# Patient Record
Sex: Female | Born: 1965
Health system: Southern US, Community
[De-identification: ages and names within clinical notes are randomized; demographics above are authoritative.]

## PROBLEM LIST (undated history)

## (undated) DIAGNOSIS — F32A Depression, unspecified: Secondary | ICD-10-CM

## (undated) DIAGNOSIS — F909 Attention-deficit hyperactivity disorder, unspecified type: Secondary | ICD-10-CM

## (undated) DIAGNOSIS — F329 Major depressive disorder, single episode, unspecified: Secondary | ICD-10-CM

## (undated) DIAGNOSIS — M5430 Sciatica, unspecified side: Secondary | ICD-10-CM

## (undated) DIAGNOSIS — G061 Intraspinal abscess and granuloma: Secondary | ICD-10-CM

## (undated) DIAGNOSIS — N730 Acute parametritis and pelvic cellulitis: Secondary | ICD-10-CM

## (undated) DIAGNOSIS — N83202 Unspecified ovarian cyst, left side: Secondary | ICD-10-CM

## (undated) DIAGNOSIS — F4310 Post-traumatic stress disorder, unspecified: Secondary | ICD-10-CM

## (undated) DIAGNOSIS — N83201 Unspecified ovarian cyst, right side: Secondary | ICD-10-CM

## (undated) DIAGNOSIS — A749 Chlamydial infection, unspecified: Secondary | ICD-10-CM

## (undated) HISTORY — PX: OTHER SURGICAL HISTORY: SHX169

## (undated) HISTORY — PX: ABDOMINAL HYSTERECTOMY: SHX81

## (undated) HISTORY — DX: Intraspinal abscess and granuloma: G06.1

## (undated) HISTORY — PX: BLADDER SURGERY: SHX569

## (undated) HISTORY — PX: BACK SURGERY: SHX140

## (undated) HISTORY — PX: LAPAROSCOPY: SHX197

---

## 2003-10-22 ENCOUNTER — Emergency Department (HOSPITAL_COMMUNITY): Admission: EM | Admit: 2003-10-22 | Discharge: 2003-10-22 | Payer: Self-pay | Admitting: Emergency Medicine

## 2003-10-25 ENCOUNTER — Emergency Department (HOSPITAL_COMMUNITY): Admission: EM | Admit: 2003-10-25 | Discharge: 2003-10-25 | Payer: Self-pay | Admitting: Emergency Medicine

## 2004-01-25 ENCOUNTER — Ambulatory Visit (HOSPITAL_COMMUNITY): Admission: RE | Admit: 2004-01-25 | Discharge: 2004-01-25 | Payer: Self-pay | Admitting: Family Medicine

## 2004-02-11 ENCOUNTER — Emergency Department (HOSPITAL_COMMUNITY): Admission: EM | Admit: 2004-02-11 | Discharge: 2004-02-11 | Payer: Self-pay | Admitting: Emergency Medicine

## 2004-02-13 ENCOUNTER — Ambulatory Visit (HOSPITAL_COMMUNITY): Admission: RE | Admit: 2004-02-13 | Discharge: 2004-02-13 | Payer: Self-pay | Admitting: Family Medicine

## 2004-03-31 ENCOUNTER — Emergency Department (HOSPITAL_COMMUNITY): Admission: EM | Admit: 2004-03-31 | Discharge: 2004-03-31 | Payer: Self-pay | Admitting: Emergency Medicine

## 2004-05-16 ENCOUNTER — Emergency Department (HOSPITAL_COMMUNITY): Admission: EM | Admit: 2004-05-16 | Discharge: 2004-05-17 | Payer: Self-pay | Admitting: Emergency Medicine

## 2004-07-21 ENCOUNTER — Emergency Department (HOSPITAL_COMMUNITY): Admission: EM | Admit: 2004-07-21 | Discharge: 2004-07-21 | Payer: Self-pay | Admitting: Emergency Medicine

## 2004-07-24 ENCOUNTER — Ambulatory Visit: Payer: Self-pay | Admitting: Family Medicine

## 2004-08-02 ENCOUNTER — Ambulatory Visit (HOSPITAL_COMMUNITY): Admission: RE | Admit: 2004-08-02 | Discharge: 2004-08-02 | Payer: Self-pay | Admitting: Family Medicine

## 2004-08-22 ENCOUNTER — Encounter: Admission: RE | Admit: 2004-08-22 | Discharge: 2004-09-25 | Payer: Self-pay | Admitting: Family Medicine

## 2004-09-01 ENCOUNTER — Emergency Department (HOSPITAL_COMMUNITY): Admission: EM | Admit: 2004-09-01 | Discharge: 2004-09-01 | Payer: Self-pay | Admitting: Emergency Medicine

## 2004-10-05 ENCOUNTER — Emergency Department (HOSPITAL_COMMUNITY): Admission: EM | Admit: 2004-10-05 | Discharge: 2004-10-06 | Payer: Self-pay | Admitting: Emergency Medicine

## 2005-03-05 ENCOUNTER — Emergency Department (HOSPITAL_COMMUNITY): Admission: EM | Admit: 2005-03-05 | Discharge: 2005-03-05 | Payer: Self-pay | Admitting: Emergency Medicine

## 2005-04-19 ENCOUNTER — Emergency Department (HOSPITAL_COMMUNITY): Admission: EM | Admit: 2005-04-19 | Discharge: 2005-04-19 | Payer: Self-pay | Admitting: Emergency Medicine

## 2005-05-18 ENCOUNTER — Ambulatory Visit: Payer: Self-pay | Admitting: Family Medicine

## 2005-06-27 ENCOUNTER — Emergency Department (HOSPITAL_COMMUNITY): Admission: EM | Admit: 2005-06-27 | Discharge: 2005-06-28 | Payer: Self-pay | Admitting: Emergency Medicine

## 2005-07-24 ENCOUNTER — Ambulatory Visit: Payer: Self-pay | Admitting: Family Medicine

## 2006-02-12 ENCOUNTER — Emergency Department (HOSPITAL_COMMUNITY): Admission: EM | Admit: 2006-02-12 | Discharge: 2006-02-12 | Payer: Self-pay | Admitting: Emergency Medicine

## 2006-03-11 ENCOUNTER — Encounter: Admission: RE | Admit: 2006-03-11 | Discharge: 2006-03-11 | Payer: Self-pay | Admitting: Orthopedic Surgery

## 2006-04-28 ENCOUNTER — Emergency Department (HOSPITAL_COMMUNITY): Admission: EM | Admit: 2006-04-28 | Discharge: 2006-04-28 | Payer: Self-pay | Admitting: Emergency Medicine

## 2006-05-06 ENCOUNTER — Emergency Department (HOSPITAL_COMMUNITY): Admission: EM | Admit: 2006-05-06 | Discharge: 2006-05-06 | Payer: Self-pay | Admitting: *Deleted

## 2006-05-24 ENCOUNTER — Emergency Department (HOSPITAL_COMMUNITY): Admission: EM | Admit: 2006-05-24 | Discharge: 2006-05-24 | Payer: Self-pay | Admitting: *Deleted

## 2006-06-19 ENCOUNTER — Encounter: Admission: RE | Admit: 2006-06-19 | Discharge: 2006-06-19 | Payer: Self-pay | Admitting: Gastroenterology

## 2006-09-30 ENCOUNTER — Emergency Department (HOSPITAL_COMMUNITY): Admission: EM | Admit: 2006-09-30 | Discharge: 2006-09-30 | Payer: Self-pay | Admitting: Emergency Medicine

## 2006-11-24 ENCOUNTER — Emergency Department (HOSPITAL_COMMUNITY): Admission: EM | Admit: 2006-11-24 | Discharge: 2006-11-24 | Payer: Self-pay | Admitting: Emergency Medicine

## 2006-12-16 ENCOUNTER — Emergency Department (HOSPITAL_COMMUNITY): Admission: EM | Admit: 2006-12-16 | Discharge: 2006-12-16 | Payer: Self-pay | Admitting: Emergency Medicine

## 2006-12-24 ENCOUNTER — Ambulatory Visit: Admission: RE | Admit: 2006-12-24 | Discharge: 2006-12-24 | Payer: Self-pay | Admitting: Orthopedic Surgery

## 2007-01-19 ENCOUNTER — Encounter (INDEPENDENT_AMBULATORY_CARE_PROVIDER_SITE_OTHER): Payer: Self-pay | Admitting: Specialist

## 2007-01-19 ENCOUNTER — Inpatient Hospital Stay (HOSPITAL_COMMUNITY): Admission: RE | Admit: 2007-01-19 | Discharge: 2007-01-25 | Payer: Self-pay | Admitting: Orthopedic Surgery

## 2007-02-22 ENCOUNTER — Emergency Department (HOSPITAL_COMMUNITY): Admission: EM | Admit: 2007-02-22 | Discharge: 2007-02-22 | Payer: Self-pay | Admitting: Emergency Medicine

## 2007-04-27 ENCOUNTER — Emergency Department (HOSPITAL_COMMUNITY): Admission: EM | Admit: 2007-04-27 | Discharge: 2007-04-28 | Payer: Self-pay | Admitting: Emergency Medicine

## 2007-05-02 ENCOUNTER — Emergency Department (HOSPITAL_COMMUNITY): Admission: EM | Admit: 2007-05-02 | Discharge: 2007-05-02 | Payer: Self-pay | Admitting: Emergency Medicine

## 2007-05-27 ENCOUNTER — Emergency Department (HOSPITAL_COMMUNITY): Admission: EM | Admit: 2007-05-27 | Discharge: 2007-05-27 | Payer: Self-pay | Admitting: Emergency Medicine

## 2007-06-17 ENCOUNTER — Emergency Department (HOSPITAL_COMMUNITY): Admission: EM | Admit: 2007-06-17 | Discharge: 2007-06-17 | Payer: Self-pay | Admitting: Emergency Medicine

## 2007-06-29 ENCOUNTER — Other Ambulatory Visit: Payer: Self-pay

## 2007-06-29 ENCOUNTER — Inpatient Hospital Stay (HOSPITAL_COMMUNITY): Admission: RE | Admit: 2007-06-29 | Discharge: 2007-07-01 | Payer: Self-pay | Admitting: *Deleted

## 2007-06-29 ENCOUNTER — Other Ambulatory Visit: Payer: Self-pay | Admitting: Emergency Medicine

## 2007-06-29 ENCOUNTER — Ambulatory Visit: Payer: Self-pay | Admitting: *Deleted

## 2007-08-08 ENCOUNTER — Emergency Department (HOSPITAL_COMMUNITY): Admission: EM | Admit: 2007-08-08 | Discharge: 2007-08-08 | Payer: Self-pay | Admitting: Emergency Medicine

## 2007-09-23 ENCOUNTER — Emergency Department (HOSPITAL_COMMUNITY): Admission: EM | Admit: 2007-09-23 | Discharge: 2007-09-23 | Payer: Self-pay | Admitting: Emergency Medicine

## 2007-10-03 ENCOUNTER — Ambulatory Visit: Payer: Self-pay | Admitting: Physical Medicine & Rehabilitation

## 2007-10-03 ENCOUNTER — Encounter
Admission: RE | Admit: 2007-10-03 | Discharge: 2008-01-01 | Payer: Self-pay | Admitting: Physical Medicine & Rehabilitation

## 2007-11-05 ENCOUNTER — Emergency Department (HOSPITAL_COMMUNITY): Admission: EM | Admit: 2007-11-05 | Discharge: 2007-11-05 | Payer: Self-pay | Admitting: Emergency Medicine

## 2007-11-20 ENCOUNTER — Emergency Department (HOSPITAL_COMMUNITY): Admission: EM | Admit: 2007-11-20 | Discharge: 2007-11-20 | Payer: Self-pay | Admitting: Emergency Medicine

## 2007-11-24 ENCOUNTER — Ambulatory Visit: Payer: Self-pay | Admitting: Physical Medicine & Rehabilitation

## 2007-11-24 ENCOUNTER — Encounter
Admission: RE | Admit: 2007-11-24 | Discharge: 2007-12-05 | Payer: Self-pay | Admitting: Physical Medicine & Rehabilitation

## 2007-12-08 ENCOUNTER — Emergency Department (HOSPITAL_COMMUNITY): Admission: EM | Admit: 2007-12-08 | Discharge: 2007-12-08 | Payer: Self-pay | Admitting: Emergency Medicine

## 2007-12-14 ENCOUNTER — Ambulatory Visit (HOSPITAL_COMMUNITY)
Admission: RE | Admit: 2007-12-14 | Discharge: 2007-12-14 | Payer: Self-pay | Admitting: Physical Medicine & Rehabilitation

## 2007-12-26 ENCOUNTER — Emergency Department (HOSPITAL_COMMUNITY): Admission: EM | Admit: 2007-12-26 | Discharge: 2007-12-26 | Payer: Self-pay | Admitting: Emergency Medicine

## 2008-01-02 ENCOUNTER — Encounter
Admission: RE | Admit: 2008-01-02 | Discharge: 2008-02-20 | Payer: Self-pay | Admitting: Physical Medicine & Rehabilitation

## 2008-01-02 ENCOUNTER — Ambulatory Visit: Payer: Self-pay | Admitting: Physical Medicine & Rehabilitation

## 2008-01-22 ENCOUNTER — Encounter: Admission: RE | Admit: 2008-01-22 | Discharge: 2008-01-22 | Payer: Self-pay | Admitting: Specialist

## 2008-02-09 ENCOUNTER — Ambulatory Visit: Payer: Self-pay | Admitting: Physical Medicine & Rehabilitation

## 2008-02-15 ENCOUNTER — Emergency Department (HOSPITAL_COMMUNITY): Admission: EM | Admit: 2008-02-15 | Discharge: 2008-02-15 | Payer: Self-pay | Admitting: Emergency Medicine

## 2008-02-20 ENCOUNTER — Ambulatory Visit: Payer: Self-pay | Admitting: Physical Medicine & Rehabilitation

## 2008-03-17 ENCOUNTER — Inpatient Hospital Stay (HOSPITAL_COMMUNITY): Admission: EM | Admit: 2008-03-17 | Discharge: 2008-03-22 | Payer: Self-pay | Admitting: *Deleted

## 2008-03-19 ENCOUNTER — Ambulatory Visit: Payer: Self-pay | Admitting: *Deleted

## 2008-03-27 ENCOUNTER — Emergency Department (HOSPITAL_COMMUNITY): Admission: EM | Admit: 2008-03-27 | Discharge: 2008-03-27 | Payer: Self-pay | Admitting: Emergency Medicine

## 2008-04-23 ENCOUNTER — Emergency Department (HOSPITAL_COMMUNITY): Admission: EM | Admit: 2008-04-23 | Discharge: 2008-04-23 | Payer: Self-pay | Admitting: Emergency Medicine

## 2008-04-26 ENCOUNTER — Emergency Department (HOSPITAL_COMMUNITY): Admission: EM | Admit: 2008-04-26 | Discharge: 2008-04-27 | Payer: Self-pay | Admitting: Emergency Medicine

## 2008-05-16 ENCOUNTER — Encounter: Admission: RE | Admit: 2008-05-16 | Discharge: 2008-05-16 | Payer: Self-pay | Admitting: Orthopedic Surgery

## 2008-05-23 ENCOUNTER — Emergency Department (HOSPITAL_COMMUNITY): Admission: EM | Admit: 2008-05-23 | Discharge: 2008-05-23 | Payer: Self-pay | Admitting: Emergency Medicine

## 2008-06-14 ENCOUNTER — Emergency Department (HOSPITAL_COMMUNITY): Admission: EM | Admit: 2008-06-14 | Discharge: 2008-06-14 | Payer: Self-pay | Admitting: Emergency Medicine

## 2008-06-28 ENCOUNTER — Ambulatory Visit (HOSPITAL_COMMUNITY): Admission: RE | Admit: 2008-06-28 | Discharge: 2008-06-28 | Payer: Self-pay | Admitting: Psychiatry

## 2008-08-08 ENCOUNTER — Emergency Department (HOSPITAL_COMMUNITY): Admission: EM | Admit: 2008-08-08 | Discharge: 2008-08-08 | Payer: Self-pay | Admitting: Emergency Medicine

## 2008-08-25 ENCOUNTER — Emergency Department (HOSPITAL_COMMUNITY): Admission: EM | Admit: 2008-08-25 | Discharge: 2008-08-25 | Payer: Self-pay | Admitting: Emergency Medicine

## 2008-09-18 ENCOUNTER — Emergency Department (HOSPITAL_BASED_OUTPATIENT_CLINIC_OR_DEPARTMENT_OTHER): Admission: EM | Admit: 2008-09-18 | Discharge: 2008-09-18 | Payer: Self-pay | Admitting: Emergency Medicine

## 2008-10-22 ENCOUNTER — Emergency Department (HOSPITAL_BASED_OUTPATIENT_CLINIC_OR_DEPARTMENT_OTHER): Admission: EM | Admit: 2008-10-22 | Discharge: 2008-10-22 | Payer: Self-pay | Admitting: Emergency Medicine

## 2008-11-07 ENCOUNTER — Observation Stay (HOSPITAL_COMMUNITY): Admission: RE | Admit: 2008-11-07 | Discharge: 2008-11-10 | Payer: Self-pay | Admitting: Urology

## 2008-11-18 ENCOUNTER — Emergency Department (HOSPITAL_COMMUNITY): Admission: EM | Admit: 2008-11-18 | Discharge: 2008-11-18 | Payer: Self-pay | Admitting: Emergency Medicine

## 2009-02-03 ENCOUNTER — Emergency Department (HOSPITAL_BASED_OUTPATIENT_CLINIC_OR_DEPARTMENT_OTHER): Admission: EM | Admit: 2009-02-03 | Discharge: 2009-02-03 | Payer: Self-pay | Admitting: Emergency Medicine

## 2009-02-07 ENCOUNTER — Ambulatory Visit (HOSPITAL_COMMUNITY): Admission: RE | Admit: 2009-02-07 | Discharge: 2009-02-07 | Payer: Self-pay | Admitting: Unknown Physician Specialty

## 2009-02-15 ENCOUNTER — Emergency Department (HOSPITAL_COMMUNITY): Admission: EM | Admit: 2009-02-15 | Discharge: 2009-02-15 | Payer: Self-pay | Admitting: Emergency Medicine

## 2009-04-30 ENCOUNTER — Emergency Department (HOSPITAL_BASED_OUTPATIENT_CLINIC_OR_DEPARTMENT_OTHER): Admission: EM | Admit: 2009-04-30 | Discharge: 2009-04-30 | Payer: Self-pay | Admitting: Emergency Medicine

## 2009-05-12 ENCOUNTER — Emergency Department (HOSPITAL_COMMUNITY): Admission: EM | Admit: 2009-05-12 | Discharge: 2009-05-13 | Payer: Self-pay | Admitting: Emergency Medicine

## 2009-06-07 ENCOUNTER — Emergency Department (HOSPITAL_BASED_OUTPATIENT_CLINIC_OR_DEPARTMENT_OTHER): Admission: EM | Admit: 2009-06-07 | Discharge: 2009-06-07 | Payer: Self-pay | Admitting: Emergency Medicine

## 2009-06-30 ENCOUNTER — Emergency Department (HOSPITAL_COMMUNITY): Admission: EM | Admit: 2009-06-30 | Discharge: 2009-07-01 | Payer: Self-pay | Admitting: Emergency Medicine

## 2009-07-16 ENCOUNTER — Emergency Department (HOSPITAL_COMMUNITY): Admission: EM | Admit: 2009-07-16 | Discharge: 2009-07-16 | Payer: Self-pay | Admitting: Emergency Medicine

## 2009-08-02 ENCOUNTER — Emergency Department (HOSPITAL_COMMUNITY): Admission: EM | Admit: 2009-08-02 | Discharge: 2009-08-02 | Payer: Self-pay | Admitting: Emergency Medicine

## 2009-08-28 ENCOUNTER — Emergency Department (HOSPITAL_COMMUNITY): Admission: EM | Admit: 2009-08-28 | Discharge: 2009-08-28 | Payer: Self-pay | Admitting: Emergency Medicine

## 2009-09-20 ENCOUNTER — Emergency Department (HOSPITAL_COMMUNITY): Admission: EM | Admit: 2009-09-20 | Discharge: 2009-09-20 | Payer: Self-pay | Admitting: Emergency Medicine

## 2009-09-24 ENCOUNTER — Encounter: Admission: RE | Admit: 2009-09-24 | Discharge: 2009-10-24 | Payer: Self-pay | Admitting: Sports Medicine

## 2009-10-09 ENCOUNTER — Emergency Department (HOSPITAL_COMMUNITY): Admission: EM | Admit: 2009-10-09 | Discharge: 2009-10-09 | Payer: Self-pay | Admitting: Emergency Medicine

## 2009-10-16 ENCOUNTER — Emergency Department (HOSPITAL_COMMUNITY): Admission: EM | Admit: 2009-10-16 | Discharge: 2009-10-16 | Payer: Self-pay | Admitting: Emergency Medicine

## 2009-11-22 ENCOUNTER — Emergency Department (HOSPITAL_COMMUNITY): Admission: EM | Admit: 2009-11-22 | Discharge: 2009-11-22 | Payer: Self-pay | Admitting: Emergency Medicine

## 2009-12-04 ENCOUNTER — Emergency Department (HOSPITAL_COMMUNITY): Admission: EM | Admit: 2009-12-04 | Discharge: 2009-12-04 | Payer: Self-pay | Admitting: Emergency Medicine

## 2009-12-17 ENCOUNTER — Emergency Department (HOSPITAL_COMMUNITY): Admission: EM | Admit: 2009-12-17 | Discharge: 2009-12-17 | Payer: Self-pay | Admitting: Emergency Medicine

## 2010-01-20 ENCOUNTER — Emergency Department (HOSPITAL_COMMUNITY): Admission: EM | Admit: 2010-01-20 | Discharge: 2010-01-20 | Payer: Self-pay | Admitting: Emergency Medicine

## 2010-02-05 ENCOUNTER — Emergency Department (HOSPITAL_COMMUNITY): Admission: EM | Admit: 2010-02-05 | Discharge: 2010-02-06 | Payer: Self-pay | Admitting: Emergency Medicine

## 2010-02-06 ENCOUNTER — Inpatient Hospital Stay (HOSPITAL_COMMUNITY): Admission: AD | Admit: 2010-02-06 | Discharge: 2010-02-12 | Payer: Self-pay | Admitting: Psychiatry

## 2010-02-06 ENCOUNTER — Ambulatory Visit: Payer: Self-pay | Admitting: Psychiatry

## 2010-02-08 ENCOUNTER — Emergency Department (HOSPITAL_COMMUNITY): Admission: EM | Admit: 2010-02-08 | Discharge: 2010-02-09 | Payer: Self-pay | Admitting: Emergency Medicine

## 2010-02-18 ENCOUNTER — Emergency Department (HOSPITAL_COMMUNITY): Admission: EM | Admit: 2010-02-18 | Discharge: 2010-02-19 | Payer: Self-pay | Admitting: Emergency Medicine

## 2010-02-28 ENCOUNTER — Ambulatory Visit: Payer: Self-pay | Admitting: Family Medicine

## 2010-03-05 ENCOUNTER — Emergency Department (HOSPITAL_COMMUNITY): Admission: EM | Admit: 2010-03-05 | Discharge: 2010-03-05 | Payer: Self-pay | Admitting: Emergency Medicine

## 2010-03-08 ENCOUNTER — Emergency Department (HOSPITAL_COMMUNITY): Admission: EM | Admit: 2010-03-08 | Discharge: 2010-03-09 | Payer: Self-pay | Admitting: Emergency Medicine

## 2010-03-16 ENCOUNTER — Emergency Department (HOSPITAL_COMMUNITY): Admission: EM | Admit: 2010-03-16 | Discharge: 2010-03-16 | Payer: Self-pay | Admitting: Emergency Medicine

## 2010-03-28 ENCOUNTER — Emergency Department (HOSPITAL_COMMUNITY): Admission: EM | Admit: 2010-03-28 | Discharge: 2010-03-28 | Payer: Self-pay | Admitting: Emergency Medicine

## 2010-03-28 ENCOUNTER — Ambulatory Visit: Payer: Self-pay | Admitting: Psychiatry

## 2010-03-28 ENCOUNTER — Inpatient Hospital Stay (HOSPITAL_COMMUNITY): Admission: AD | Admit: 2010-03-28 | Discharge: 2010-04-09 | Payer: Self-pay | Admitting: Psychiatry

## 2010-06-16 ENCOUNTER — Ambulatory Visit: Payer: Self-pay | Admitting: Psychiatry

## 2010-06-27 ENCOUNTER — Emergency Department (HOSPITAL_COMMUNITY)
Admission: EM | Admit: 2010-06-27 | Discharge: 2010-06-27 | Payer: Self-pay | Source: Home / Self Care | Admitting: Emergency Medicine

## 2010-08-29 ENCOUNTER — Emergency Department (HOSPITAL_COMMUNITY): Admission: EM | Admit: 2010-08-29 | Discharge: 2010-08-29 | Payer: Self-pay | Admitting: Emergency Medicine

## 2010-10-02 ENCOUNTER — Emergency Department (HOSPITAL_COMMUNITY): Admission: EM | Admit: 2010-10-02 | Discharge: 2010-06-16 | Payer: Self-pay | Admitting: Emergency Medicine

## 2010-10-04 ENCOUNTER — Emergency Department (HOSPITAL_COMMUNITY)
Admission: EM | Admit: 2010-10-04 | Discharge: 2010-10-04 | Payer: Self-pay | Source: Home / Self Care | Admitting: Emergency Medicine

## 2010-10-06 ENCOUNTER — Emergency Department (HOSPITAL_COMMUNITY)
Admission: EM | Admit: 2010-10-06 | Discharge: 2010-10-06 | Payer: Self-pay | Source: Home / Self Care | Admitting: Emergency Medicine

## 2010-10-13 ENCOUNTER — Emergency Department (HOSPITAL_COMMUNITY)
Admission: EM | Admit: 2010-10-13 | Discharge: 2010-10-13 | Payer: Self-pay | Source: Home / Self Care | Admitting: Emergency Medicine

## 2010-11-15 ENCOUNTER — Encounter: Payer: Self-pay | Admitting: Internal Medicine

## 2011-01-06 LAB — URINALYSIS, ROUTINE W REFLEX MICROSCOPIC
Ketones, ur: NEGATIVE mg/dL
Nitrite: NEGATIVE
Urobilinogen, UA: 0.2 mg/dL (ref 0.0–1.0)
pH: 8 (ref 5.0–8.0)

## 2011-01-08 LAB — DIFFERENTIAL
Eosinophils Absolute: 0.2 10*3/uL (ref 0.0–0.7)
Eosinophils Relative: 2 % (ref 0–5)
Lymphocytes Relative: 18 % (ref 12–46)
Lymphs Abs: 2.2 10*3/uL (ref 0.7–4.0)
Monocytes Absolute: 0.9 10*3/uL (ref 0.1–1.0)
Monocytes Relative: 7 % (ref 3–12)

## 2011-01-08 LAB — URINALYSIS, ROUTINE W REFLEX MICROSCOPIC
Bilirubin Urine: NEGATIVE
Bilirubin Urine: NEGATIVE
Glucose, UA: NEGATIVE mg/dL
Hgb urine dipstick: NEGATIVE
Ketones, ur: NEGATIVE mg/dL
Ketones, ur: NEGATIVE mg/dL
Nitrite: NEGATIVE
Protein, ur: NEGATIVE mg/dL
Protein, ur: NEGATIVE mg/dL
Urobilinogen, UA: 0.2 mg/dL (ref 0.0–1.0)

## 2011-01-08 LAB — WET PREP, GENITAL
Clue Cells Wet Prep HPF POC: NONE SEEN
Trich, Wet Prep: NONE SEEN
Yeast Wet Prep HPF POC: NONE SEEN

## 2011-01-08 LAB — RAPID URINE DRUG SCREEN, HOSP PERFORMED
Amphetamines: POSITIVE — AB
Barbiturates: NOT DETECTED
Cocaine: POSITIVE — AB
Opiates: POSITIVE — AB
Tetrahydrocannabinol: NOT DETECTED

## 2011-01-08 LAB — BASIC METABOLIC PANEL
BUN: 6 mg/dL (ref 6–23)
CO2: 24 mEq/L (ref 19–32)
Calcium: 9 mg/dL (ref 8.4–10.5)
GFR calc non Af Amer: >60 mL/min (ref >60)
Glucose, Bld: 94 mg/dL (ref 70–99)
Potassium: 3.3 mEq/L — ABNORMAL LOW (ref 3.5–5.1)

## 2011-01-08 LAB — CBC
HCT: 37.7 % (ref 36.0–46.0)
MCH: 31.2 pg (ref 26.0–34.0)
MCV: 89.4 fL (ref 78.0–100.0)
RBC: 4.22 MIL/uL (ref 3.87–5.11)
WBC: 12.1 10*3/uL — ABNORMAL HIGH (ref 4.0–10.5)

## 2011-01-08 LAB — RPR: RPR Ser Ql: NONREACTIVE

## 2011-01-08 LAB — GC/CHLAMYDIA PROBE AMP, GENITAL
Chlamydia, DNA Probe: NEGATIVE
GC Probe Amp, Genital: NEGATIVE

## 2011-01-11 LAB — URINALYSIS, ROUTINE W REFLEX MICROSCOPIC
Bilirubin Urine: NEGATIVE
Glucose, UA: NEGATIVE mg/dL
Ketones, ur: NEGATIVE mg/dL
Leukocytes, UA: NEGATIVE
Protein, ur: NEGATIVE mg/dL
pH: 6 (ref 5.0–8.0)

## 2011-01-11 LAB — URINE MICROSCOPIC-ADD ON

## 2011-01-11 LAB — POCT PREGNANCY, URINE: Preg Test, Ur: NEGATIVE

## 2011-01-12 LAB — COMPREHENSIVE METABOLIC PANEL
ALT: 11 U/L (ref 0–35)
Alkaline Phosphatase: 58 U/L (ref 39–117)
CO2: 25 mEq/L (ref 19–32)
Calcium: 8.5 mg/dL (ref 8.4–10.5)
GFR calc non Af Amer: >60 mL/min (ref >60)
Glucose, Bld: 94 mg/dL (ref 70–99)
Potassium: 4.1 mEq/L (ref 3.5–5.1)
Sodium: 136 mEq/L (ref 135–145)

## 2011-01-12 LAB — URINALYSIS, ROUTINE W REFLEX MICROSCOPIC
Bilirubin Urine: NEGATIVE
Bilirubin Urine: NEGATIVE
Glucose, UA: NEGATIVE mg/dL
Hgb urine dipstick: NEGATIVE
Ketones, ur: NEGATIVE mg/dL
Nitrite: NEGATIVE
Protein, ur: NEGATIVE mg/dL
Protein, ur: NEGATIVE mg/dL
Urobilinogen, UA: 0.2 mg/dL (ref 0.0–1.0)

## 2011-01-12 LAB — DIFFERENTIAL
Basophils Absolute: 0 10*3/uL (ref 0.0–0.1)
Basophils Relative: 0 % (ref 0–1)
Eosinophils Absolute: 0.2 10*3/uL (ref 0.0–0.7)
Neutrophils Relative %: 66 % (ref 43–77)

## 2011-01-12 LAB — CBC
HCT: 40.1 % (ref 36.0–46.0)
Hemoglobin: 13.9 g/dL (ref 12.0–15.0)
MCHC: 34.8 g/dL (ref 30.0–36.0)
RBC: 4.34 MIL/uL (ref 3.87–5.11)

## 2011-01-12 LAB — URINE CULTURE
Colony Count: 2000
Special Requests: NEGATIVE

## 2011-01-12 LAB — POCT CARDIAC MARKERS
CKMB, poc: <1 ng/mL — ABNORMAL LOW (ref 1.0–8.0)
Troponin i, poc: <0.05 ng/mL (ref 0.00–0.09)

## 2011-01-12 LAB — SAMPLE TO BLOOD BANK

## 2011-01-12 LAB — TSH: TSH: 3.33 u[IU]/mL (ref 0.350–4.500)

## 2011-01-12 LAB — URINE MICROSCOPIC-ADD ON

## 2011-01-12 LAB — RAPID URINE DRUG SCREEN, HOSP PERFORMED
Benzodiazepines: POSITIVE — AB
Cocaine: POSITIVE — AB
Tetrahydrocannabinol: NOT DETECTED

## 2011-01-13 LAB — DIFFERENTIAL
Basophils Absolute: 0.1 10*3/uL (ref 0.0–0.1)
Basophils Relative: 1 % (ref 0–1)
Eosinophils Absolute: 0.3 10*3/uL (ref 0.0–0.7)
Eosinophils Relative: 3 % (ref 0–5)
Monocytes Absolute: 0.8 10*3/uL (ref 0.1–1.0)
Neutro Abs: 4.1 10*3/uL (ref 1.7–7.7)

## 2011-01-13 LAB — URINE MICROSCOPIC-ADD ON

## 2011-01-13 LAB — URINALYSIS, ROUTINE W REFLEX MICROSCOPIC
Bilirubin Urine: NEGATIVE
Bilirubin Urine: NEGATIVE
Glucose, UA: NEGATIVE mg/dL
Ketones, ur: NEGATIVE mg/dL
Nitrite: NEGATIVE
Nitrite: NEGATIVE
Specific Gravity, Urine: 1.012 (ref 1.005–1.030)
Specific Gravity, Urine: 1.016 (ref 1.005–1.030)
Urobilinogen, UA: 0.2 mg/dL (ref 0.0–1.0)
Urobilinogen, UA: 0.2 mg/dL (ref 0.0–1.0)
pH: 6 (ref 5.0–8.0)

## 2011-01-13 LAB — POCT I-STAT, CHEM 8
Calcium, Ion: 1.15 mmol/L (ref 1.12–1.32)
HCT: 39 % (ref 36.0–46.0)
Hemoglobin: 13.3 g/dL (ref 12.0–15.0)
TCO2: 26 mmol/L (ref 0–100)

## 2011-01-13 LAB — CBC
HCT: 37 % (ref 36.0–46.0)
Hemoglobin: 12.6 g/dL (ref 12.0–15.0)
MCHC: 34 g/dL (ref 30.0–36.0)
MCV: 92.2 fL (ref 78.0–100.0)
RDW: 12.8 % (ref 11.5–15.5)

## 2011-01-13 LAB — GC/CHLAMYDIA PROBE AMP, GENITAL: GC Probe Amp, Genital: NEGATIVE

## 2011-01-13 LAB — WET PREP, GENITAL: WBC, Wet Prep HPF POC: NONE SEEN

## 2011-01-14 LAB — RAPID URINE DRUG SCREEN, HOSP PERFORMED
Amphetamines: NOT DETECTED
Barbiturates: NOT DETECTED
Benzodiazepines: POSITIVE — AB
Opiates: NOT DETECTED

## 2011-01-14 LAB — CBC
HCT: 40.5 % (ref 36.0–46.0)
HCT: 31.9 % — ABNORMAL LOW (ref 36.0–46.0)
MCHC: 34.2 g/dL (ref 30.0–36.0)
MCHC: 34.6 g/dL (ref 30.0–36.0)
MCV: 91.4 fL (ref 78.0–100.0)
MCV: 92.8 fL (ref 78.0–100.0)
MCV: 93.2 fL (ref 78.0–100.0)
Platelets: 472 10*3/uL — ABNORMAL HIGH (ref 150–400)
Platelets: 732 10*3/uL — ABNORMAL HIGH (ref 150–400)
RBC: 3.43 MIL/uL — ABNORMAL LOW (ref 3.87–5.11)
RDW: 13 % (ref 11.5–15.5)
RDW: 13.1 % (ref 11.5–15.5)
WBC: 6.3 10*3/uL (ref 4.0–10.5)
WBC: 9.9 10*3/uL (ref 4.0–10.5)

## 2011-01-14 LAB — POCT I-STAT, CHEM 8
HCT: 41 % (ref 36.0–46.0)
Hemoglobin: 13.9 g/dL (ref 12.0–15.0)
Potassium: 3.5 mEq/L (ref 3.5–5.1)
Sodium: 139 mEq/L (ref 135–145)
TCO2: 30 mmol/L (ref 0–100)

## 2011-01-14 LAB — URINALYSIS, ROUTINE W REFLEX MICROSCOPIC
Bilirubin Urine: NEGATIVE
Bilirubin Urine: NEGATIVE
Bilirubin Urine: NEGATIVE
Glucose, UA: NEGATIVE mg/dL
Glucose, UA: NEGATIVE mg/dL
Glucose, UA: NEGATIVE mg/dL
Hgb urine dipstick: NEGATIVE
Ketones, ur: NEGATIVE mg/dL
Ketones, ur: NEGATIVE mg/dL
Ketones, ur: >80 mg/dL — AB
Nitrite: NEGATIVE
Protein, ur: NEGATIVE mg/dL
Protein, ur: NEGATIVE mg/dL
Specific Gravity, Urine: 1.02 (ref 1.005–1.030)
pH: 6.5 (ref 5.0–8.0)

## 2011-01-14 LAB — DIFFERENTIAL
Basophils Relative: 1 % (ref 0–1)
Basophils Relative: 1 % (ref 0–1)
Eosinophils Absolute: 0.2 10*3/uL (ref 0.0–0.7)
Eosinophils Absolute: 0.3 10*3/uL (ref 0.0–0.7)
Eosinophils Relative: 1 % (ref 0–5)
Eosinophils Relative: 4 % (ref 0–5)
Lymphs Abs: 2.1 10*3/uL (ref 0.7–4.0)
Lymphs Abs: 1.4 10*3/uL (ref 0.7–4.0)
Monocytes Absolute: 0.6 10*3/uL (ref 0.1–1.0)
Monocytes Relative: 6 % (ref 3–12)
Neutro Abs: 7.2 10*3/uL (ref 1.7–7.7)
Neutrophils Relative %: 70 % (ref 43–77)

## 2011-01-14 LAB — URINE MICROSCOPIC-ADD ON

## 2011-01-14 LAB — URINE CULTURE
Colony Count: 8000
Colony Count: NO GROWTH

## 2011-01-14 LAB — GC/CHLAMYDIA PROBE AMP, GENITAL: Chlamydia, DNA Probe: NEGATIVE

## 2011-01-14 LAB — BASIC METABOLIC PANEL
BUN: 4 mg/dL — ABNORMAL LOW (ref 6–23)
Chloride: 105 mEq/L (ref 96–112)
Glucose, Bld: 94 mg/dL (ref 70–99)
Potassium: 3.3 mEq/L — ABNORMAL LOW (ref 3.5–5.1)

## 2011-01-14 LAB — WET PREP, GENITAL: Yeast Wet Prep HPF POC: NONE SEEN

## 2011-01-27 LAB — CBC
MCV: 92.6 fL (ref 78.0–100.0)
Platelets: 333 10*3/uL (ref 150–400)
RBC: 4.29 MIL/uL (ref 3.87–5.11)
WBC: 8.1 10*3/uL (ref 4.0–10.5)

## 2011-01-27 LAB — URINALYSIS, ROUTINE W REFLEX MICROSCOPIC
Glucose, UA: NEGATIVE mg/dL
Protein, ur: NEGATIVE mg/dL
pH: 5.5 (ref 5.0–8.0)

## 2011-01-27 LAB — COMPREHENSIVE METABOLIC PANEL
ALT: 16 U/L (ref 0–35)
AST: 19 U/L (ref 0–37)
Albumin: 4.4 g/dL (ref 3.5–5.2)
Alkaline Phosphatase: 65 U/L (ref 39–117)
CO2: 26 mEq/L (ref 19–32)
Chloride: 103 mEq/L (ref 96–112)
GFR calc Af Amer: >60 mL/min (ref >60)
GFR calc non Af Amer: >60 mL/min (ref >60)
Potassium: 3.2 mEq/L — ABNORMAL LOW (ref 3.5–5.1)
Sodium: 136 mEq/L (ref 135–145)
Total Bilirubin: 0.5 mg/dL (ref 0.3–1.2)

## 2011-01-27 LAB — DIFFERENTIAL
Basophils Absolute: 0 10*3/uL (ref 0.0–0.1)
Eosinophils Absolute: 0.4 10*3/uL (ref 0.0–0.7)
Eosinophils Relative: 5 % (ref 0–5)
Monocytes Absolute: 0.9 10*3/uL (ref 0.1–1.0)

## 2011-01-27 LAB — URINE MICROSCOPIC-ADD ON

## 2011-01-30 LAB — URINALYSIS, ROUTINE W REFLEX MICROSCOPIC
Bilirubin Urine: NEGATIVE
Ketones, ur: NEGATIVE mg/dL
Nitrite: NEGATIVE
Protein, ur: NEGATIVE mg/dL

## 2011-01-30 LAB — WET PREP, GENITAL: Clue Cells Wet Prep HPF POC: NONE SEEN

## 2011-01-30 LAB — GC/CHLAMYDIA PROBE AMP, GENITAL: Chlamydia, DNA Probe: NEGATIVE

## 2011-02-01 LAB — URINALYSIS, ROUTINE W REFLEX MICROSCOPIC
Glucose, UA: NEGATIVE mg/dL
Ketones, ur: NEGATIVE mg/dL
Leukocytes, UA: NEGATIVE
Nitrite: NEGATIVE
Protein, ur: NEGATIVE mg/dL
pH: 6 (ref 5.0–8.0)

## 2011-02-01 LAB — BASIC METABOLIC PANEL
BUN: 12 mg/dL (ref 6–23)
CO2: 24 mEq/L (ref 19–32)
Calcium: 8.6 mg/dL (ref 8.4–10.5)
Chloride: 102 mEq/L (ref 96–112)
GFR calc non Af Amer: >60 mL/min (ref >60)
Glucose, Bld: 94 mg/dL (ref 70–99)
Potassium: 3.5 mEq/L (ref 3.5–5.1)

## 2011-02-01 LAB — URINE MICROSCOPIC-ADD ON

## 2011-02-01 LAB — CBC
Hemoglobin: 13.6 g/dL (ref 12.0–15.0)
RBC: 4.15 MIL/uL (ref 3.87–5.11)
WBC: 12.3 10*3/uL — ABNORMAL HIGH (ref 4.0–10.5)

## 2011-02-01 LAB — DIFFERENTIAL
Lymphocytes Relative: 28 % (ref 12–46)
Monocytes Absolute: 1 10*3/uL (ref 0.1–1.0)
Monocytes Relative: 8 % (ref 3–12)
Neutro Abs: 7.6 10*3/uL (ref 1.7–7.7)

## 2011-02-04 LAB — CBC
HCT: 40.6 % (ref 36.0–46.0)
Hemoglobin: 14.1 g/dL (ref 12.0–15.0)
MCHC: 34.6 g/dL (ref 30.0–36.0)
RDW: 13.7 % (ref 11.5–15.5)

## 2011-02-04 LAB — DIFFERENTIAL
Basophils Absolute: 0.1 10*3/uL (ref 0.0–0.1)
Basophils Relative: 1 % (ref 0–1)
Eosinophils Relative: 2 % (ref 0–5)
Lymphocytes Relative: 29 % (ref 12–46)
Monocytes Absolute: 0.7 10*3/uL (ref 0.1–1.0)
Monocytes Relative: 7 % (ref 3–12)

## 2011-02-04 LAB — POCT CARDIAC MARKERS: Troponin i, poc: <0.05 ng/mL (ref 0.00–0.09)

## 2011-02-09 LAB — URINALYSIS, ROUTINE W REFLEX MICROSCOPIC
Bilirubin Urine: NEGATIVE
Glucose, UA: NEGATIVE mg/dL
Ketones, ur: NEGATIVE mg/dL
Protein, ur: NEGATIVE mg/dL
Urobilinogen, UA: 0.2 mg/dL (ref 0.0–1.0)

## 2011-02-09 LAB — CBC
HCT: 41 % (ref 36.0–46.0)
Hemoglobin: 14 g/dL (ref 12.0–15.0)
RBC: 4.55 MIL/uL (ref 3.87–5.11)
RDW: 13.3 % (ref 11.5–15.5)

## 2011-02-09 LAB — URINE MICROSCOPIC-ADD ON

## 2011-02-09 LAB — HEMOGLOBIN AND HEMATOCRIT, BLOOD
HCT: 36.8 % (ref 36.0–46.0)
Hemoglobin: 12.6 g/dL (ref 12.0–15.0)
Hemoglobin: 12.7 g/dL (ref 12.0–15.0)

## 2011-02-09 LAB — WET PREP, GENITAL: Trich, Wet Prep: NONE SEEN

## 2011-02-09 LAB — BASIC METABOLIC PANEL
GFR calc Af Amer: >60 mL/min (ref >60)
GFR calc non Af Amer: >60 mL/min (ref >60)
Glucose, Bld: 106 mg/dL — ABNORMAL HIGH (ref 70–99)
Potassium: 3.6 mEq/L (ref 3.5–5.1)
Sodium: 139 mEq/L (ref 135–145)

## 2011-02-09 LAB — POCT PREGNANCY, URINE: Preg Test, Ur: NEGATIVE

## 2011-02-09 LAB — TYPE AND SCREEN: Antibody Screen: NEGATIVE

## 2011-02-09 LAB — ABO/RH: ABO/RH(D): A POS

## 2011-02-09 LAB — GC/CHLAMYDIA PROBE AMP, GENITAL: Chlamydia, DNA Probe: NEGATIVE

## 2011-02-12 ENCOUNTER — Emergency Department (HOSPITAL_COMMUNITY)
Admission: EM | Admit: 2011-02-12 | Discharge: 2011-02-13 | Disposition: A | Discharge status: Home / Self Care | Payer: Medicaid Other | Source: Home / Self Care | Attending: Emergency Medicine | Admitting: Emergency Medicine

## 2011-02-12 DIAGNOSIS — F988 Other specified behavioral and emotional disorders with onset usually occurring in childhood and adolescence: Secondary | ICD-10-CM | POA: Insufficient documentation

## 2011-02-12 DIAGNOSIS — M545 Low back pain, unspecified: Secondary | ICD-10-CM | POA: Insufficient documentation

## 2011-02-12 DIAGNOSIS — Z79899 Other long term (current) drug therapy: Secondary | ICD-10-CM | POA: Insufficient documentation

## 2011-02-12 DIAGNOSIS — F3289 Other specified depressive episodes: Secondary | ICD-10-CM | POA: Insufficient documentation

## 2011-02-12 DIAGNOSIS — G8929 Other chronic pain: Secondary | ICD-10-CM | POA: Insufficient documentation

## 2011-02-12 DIAGNOSIS — R45851 Suicidal ideations: Secondary | ICD-10-CM | POA: Insufficient documentation

## 2011-02-12 DIAGNOSIS — F329 Major depressive disorder, single episode, unspecified: Secondary | ICD-10-CM | POA: Insufficient documentation

## 2011-02-13 ENCOUNTER — Emergency Department (HOSPITAL_COMMUNITY)
Admission: EM | Admit: 2011-02-13 | Discharge: 2011-02-14 | Disposition: A | Discharge status: Other Acute Inpatient Hospital | Payer: Medicaid Other | Source: Home / Self Care | Attending: Emergency Medicine | Admitting: Emergency Medicine

## 2011-02-13 DIAGNOSIS — F411 Generalized anxiety disorder: Secondary | ICD-10-CM | POA: Insufficient documentation

## 2011-02-13 DIAGNOSIS — M549 Dorsalgia, unspecified: Secondary | ICD-10-CM | POA: Insufficient documentation

## 2011-02-13 DIAGNOSIS — R45851 Suicidal ideations: Secondary | ICD-10-CM | POA: Insufficient documentation

## 2011-02-13 DIAGNOSIS — G8929 Other chronic pain: Secondary | ICD-10-CM | POA: Insufficient documentation

## 2011-02-13 DIAGNOSIS — F3289 Other specified depressive episodes: Secondary | ICD-10-CM | POA: Insufficient documentation

## 2011-02-13 DIAGNOSIS — F329 Major depressive disorder, single episode, unspecified: Secondary | ICD-10-CM | POA: Insufficient documentation

## 2011-02-13 LAB — RAPID URINE DRUG SCREEN, HOSP PERFORMED
Amphetamines: NOT DETECTED
Benzodiazepines: POSITIVE — AB
Cocaine: NOT DETECTED
Opiates: NOT DETECTED
Tetrahydrocannabinol: NOT DETECTED

## 2011-02-13 LAB — DIFFERENTIAL
Basophils Absolute: 0.1 10*3/uL (ref 0.0–0.1)
Basophils Relative: 0 % (ref 0–1)
Lymphocytes Relative: 11 % — ABNORMAL LOW (ref 12–46)
Lymphocytes Relative: 25 % (ref 12–46)
Lymphs Abs: 2 10*3/uL (ref 0.7–4.0)
Lymphs Abs: 3.1 10*3/uL (ref 0.7–4.0)
Monocytes Absolute: 0.9 10*3/uL (ref 0.1–1.0)
Monocytes Relative: 9 % (ref 3–12)
Neutro Abs: 14.1 10*3/uL — ABNORMAL HIGH (ref 1.7–7.7)
Neutro Abs: 7.9 10*3/uL — ABNORMAL HIGH (ref 1.7–7.7)
Neutrophils Relative %: 80 % — ABNORMAL HIGH (ref 43–77)

## 2011-02-13 LAB — COMPREHENSIVE METABOLIC PANEL
ALT: 11 U/L (ref 0–35)
Albumin: 4 g/dL (ref 3.5–5.2)
Alkaline Phosphatase: 72 U/L (ref 39–117)
BUN: 15 mg/dL (ref 6–23)
Calcium: 9.4 mg/dL (ref 8.4–10.5)
Glucose, Bld: 107 mg/dL — ABNORMAL HIGH (ref 70–99)
Potassium: 3.8 mEq/L (ref 3.5–5.1)
Sodium: 136 mEq/L (ref 135–145)
Total Protein: 7.9 g/dL (ref 6.0–8.3)

## 2011-02-13 LAB — CBC
HCT: 40 % (ref 36.0–46.0)
HCT: 40.5 % (ref 36.0–46.0)
Hemoglobin: 14 g/dL (ref 12.0–15.0)
Hemoglobin: 14.2 g/dL (ref 12.0–15.0)
MCHC: 34.6 g/dL (ref 30.0–36.0)
MCHC: 35.5 g/dL (ref 30.0–36.0)
MCV: 87.3 fL (ref 78.0–100.0)
MCV: 87.7 fL (ref 78.0–100.0)
Platelets: 413 10*3/uL — ABNORMAL HIGH (ref 150–400)
RBC: 4.58 MIL/uL (ref 3.87–5.11)
RDW: 12.9 % (ref 11.5–15.5)
WBC: 12.2 10*3/uL — ABNORMAL HIGH (ref 4.0–10.5)

## 2011-02-13 LAB — BASIC METABOLIC PANEL
CO2: 24 mEq/L (ref 19–32)
Chloride: 104 mEq/L (ref 96–112)
GFR calc Af Amer: >60 mL/min (ref >60)
Glucose, Bld: 128 mg/dL — ABNORMAL HIGH (ref 70–99)
Sodium: 135 mEq/L (ref 135–145)

## 2011-02-13 LAB — ETHANOL: Alcohol, Ethyl (B): <5 mg/dL (ref 0–10)

## 2011-02-14 ENCOUNTER — Inpatient Hospital Stay (HOSPITAL_COMMUNITY)
Admission: RE | Admit: 2011-02-14 | Discharge: 2011-02-26 | DRG: 885 | Disposition: A | Discharge status: Home / Self Care | Payer: Medicaid Other | Source: Ambulatory Visit | Attending: Psychiatry | Admitting: Psychiatry

## 2011-02-14 DIAGNOSIS — M549 Dorsalgia, unspecified: Secondary | ICD-10-CM

## 2011-02-14 DIAGNOSIS — F4310 Post-traumatic stress disorder, unspecified: Secondary | ICD-10-CM

## 2011-02-14 DIAGNOSIS — N3010 Interstitial cystitis (chronic) without hematuria: Secondary | ICD-10-CM

## 2011-02-14 DIAGNOSIS — G8929 Other chronic pain: Secondary | ICD-10-CM

## 2011-02-14 DIAGNOSIS — R45851 Suicidal ideations: Secondary | ICD-10-CM

## 2011-02-14 DIAGNOSIS — F322 Major depressive disorder, single episode, severe without psychotic features: Principal | ICD-10-CM

## 2011-02-15 LAB — TSH: TSH: 2.771 u[IU]/mL (ref 0.350–4.500)

## 2011-02-15 LAB — VITAMIN B12: Vitamin B-12: 331 pg/mL (ref 211–911)

## 2011-02-16 NOTE — H&P (Signed)
[name], DOUBLE            ACCOUNT NO.:  [id number]  MEDICAL RECORD NO.:  [id number]           PATIENT TYPE:  I  LOCATION:  0508                          FACILITY[facility]  [facility]  PHYSICIAN:  Franchot Gallo, MD     DATE OF BIRTH:  Sep 26, 1966  DATE OF ADMISSION:  02/14/2011 DATE OF DISCHARGE:                      PSYCHIATRIC ADMISSION ASSESSMENT   This is a 45 year old female voluntarily admitted on February 14, 2011.  HISTORY OF PRESENT ILLNESS:  The patient reports with a history of suicidal thoughts.  She reports she has been compliant with her medications and that it is making her very nervous that she is having these thoughts even with taking her medicines as prescribed.  She states that she has been feeling very depressed and hopeless.  She has been having conflict with her 25 year old daughter who has asked her to find her own place.  She denies any substance use.  She denies any psychotic symptoms.  PAST PSYCHIATRIC HISTORY:  The patient was here about 1 year ago.  She sees Cindra Eves, NP at [facility].  Has been on Zoloft before but it was not effective.  SOCIAL HISTORY:  The patient is a single female.  She lives with her 38- year-old daughter.  The patient intends to find her own new living situation.  She is going to be filing for disability.  FAMILY HISTORY:  Mother completed suicide.  ALCOHOL AND DRUG HISTORY:  The patient denies.  PRIMARY CARE PROVIDER:  Rema Fendt at [facility].  Melina Fiddler, MD.  MEDICAL PROBLEMS:  History of torn rotator cuff and due to have surgery.  MEDICATIONS: 1. Include Celexa 30 mg at bedtime. 2. Wellbutrin 300 daily. 3. Ativan 1 mg t.i.d. 4. Adderall as needed. 5. Percocet for pain.  DRUG ALLERGIES:  Imitrex, clindamycin, morphine and Vicodin.  PHYSICAL EXAM:  This is a middle-aged female assessed in the emergency department.  Physical exam was reviewed.  No significant findings. Urine drug screen was  positive for benzodiazepines.  Negative for opiates.  Alcohol level less than 5.  WBC count is 17.8.  MENTAL STATUS EXAM:  The patient is in bed.  She awakens easily.  She recognizes me from her last admission of a year ago.  Feeling very anxious again that she is here after taking her medications.  She wants to get help.  She is asking for her Ativan and to get her pain medication clarified.  She has no overt plans to harm herself in the facility.  Thought processes are coherent, goal directed.  Concentration intact.  DIAGNOSES:  AXIS I:  Major depressive disorder, severe; history of PTSD (post traumatic stress disorder). AXIS II:  No diagnosis. AXIS III:  History of interstitial cystitis, rotator cuff injury. AXIS IV:  Problems with her daughter, medical problems and other psychosocial problems. AXIS V:  Current is 30.  PLAN:  To continue the patient's medications.  Will clarify her Ativan and her pain medicine.  Encourage groups.  Identify her support group. Her tentative length of stay is 3-5 days. Landry Corporal, N.P.   ______________________________ Franchot Gallo, MD    JO/MEDQ  D:  02/14/2011  T:  02/14/2011  Job:  [id number]  Electronically Signed by Limmie Patricia.P. on 02/16/2011 09:56:47 AM Electronically Signed by Franchot Gallo MD on 02/16/2011 05:33:35 PM

## 2011-02-24 DIAGNOSIS — F339 Major depressive disorder, recurrent, unspecified: Secondary | ICD-10-CM

## 2011-03-10 NOTE — Procedures (Signed)
Virginia, Kennedy            ACCOUNT NO.:  [id number]   MEDICAL RECORD NO.:  [id number]          PATIENT TYPE:  EMS   LOCATION:  ED                           FACILITY:  [facility]   PHYSICIAN:  Erick Colace, M.D.DATE OF BIRTH:  Jan 29, 1966   DATE OF PROCEDURE:  02/20/2008  DATE OF DISCHARGE:  02/15/2008                               OPERATIVE REPORT   PROCEDURE:  Left sacroiliac injection under fluoroscopic guidance.   INDICATIONS:  Lumbar axial pain, more left-sided in the buttock, history  of laminectomy in the past.  Pain is partially responsive to  medications.   The patient placed prone on fluoroscopy table.  Betadine prep, sterile  drape, 25-gauge inch and a half needle was used to anesthetize the skin  and subcu tissue 1% lidocaine x2 mL.  Then a 3-1/2 inch 25-gauge spinal  needle was inserted under fluoroscopic guidance, left SI joint AP  lateral oblique imaging.  Omnipaque 180 x 0.5 mL demonstrated no  intravascular uptake and 0.5 mL of 40 mg per mL Depo-Medrol plus 1.5 mL  of 2% lidocaine were injected.  The patient tolerated procedure well.  Pre and post injection vitals stable.  Return 1 month.      Erick Colace, M.D.  Electronically Signed     AEK/MEDQ  D:  02/20/2008 13:55:50  T:  02/20/2008 14:09:16  Job:  [id number]
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
## 2011-03-10 NOTE — Discharge Summary (Signed)
[name], DOMBROSKY            ACCOUNT NO.:  [id number]   MEDICAL RECORD NO.:  [id number]          PATIENT TYPE:  INP   LOCATION:  1426                         FACILITY:  [facility]   PHYSICIAN:  Heloise Purpura, MD      DATE OF BIRTH:  1965-12-19   DATE OF ADMISSION:  11/07/2008  DATE OF DISCHARGE:  11/10/2008                               DISCHARGE SUMMARY   ADMISSION DIAGNOSES:  1. Vaginal vault prolapse.  2. Stress urinary incontinence.   HISTORY AND PHYSICAL:  For full details, please see admission history  and physical.  Briefly, [name] is a 45 year old female who is a  patient of Dr. Bjorn Pippin.  She was evaluated for vaginal vault prolapse  and stress urinary incontinence.  Based on her evaluation by Dr. Annabell Howells,  she was felt to potentially benefit from an abdominal sacrocolpopexy  procedure, as well as any anti-incontinence procedure.  He discussed  options and she was interested in a minimally invasive approach to her  sacrocolpopexy in conjunction with the urethral sling procedure.  She  was evaluated by myself and it was decided to proceed in this fashion.  The patient was thoroughly counseled regarding the potential risks and  expected outcomes of the procedure and informed consent was obtained.   HOSPITAL COURSE:  On 11/07/2008, the patient was taken to the operating  room and underwent a robotic-assisted laparoscopic sacrocolpopexy, along  with a urethral sling.  She tolerated this procedure well and without  complications.  In the recovery unit, the patient appeared to be having  much more pain than would typically be expected after this operation.  She was thoroughly evaluated and did not appear to have any concerning  findings.  She remained afebrile and with stable vital signs.  Her  postoperative laboratory work indicated a relatively stable hematocrit  of 36.8 and she had very little bleeding during her procedure.  She was  therefore monitored and remained  hemodynamically stable that evening.  However, it became clear that she appeared to have a very low pain  tolerance requiring an unusually large amount of narcotic pain  medication.  She was maintained on a Dilaudid PCA which seemed to  control her pain adequately.  On postoperative day 1, her Foley catheter  and vaginal packing were removed.  She remained hemodynamically stable  and her hematocrit was stable at 37.2.  She was given a Dulcolax  suppository to aid with return of bowel function.  She subsequently  developed excessive diarrhea throughout the afternoon and evening of  postoperative day 1.  She was transitioned off her PCA but required  p.r.n. Dilaudid and did take the maximum amount allowed to her every 2  hours.  She was reevaluated on the morning of postoperative day 2 and  her diarrhea had ceased.  She was able to tolerate a regular diet and  was ambulating without difficulty.  She continued to complain of  excessive pain although again on her physical exam and clinical  evaluation did not appear to have any concerning findings.  It was  decided after talking with the patient that  we would discontinue her IV  pain medication that day.  She was able to tolerate and manage her pain  with oral Percocet throughout that day and afternoon.  She did not feel  that she was ready for discharge on the afternoon of postoperative day 2  and requested that she stay that evening.  On postoperative day 3, she  was evaluated and appeared to be doing well.  Her pain was tolerable  with oral pain medication and she was discharged home in stable  condition.   DISPOSITION:  Home.   DISCHARGE MEDICATIONS:  She was instructed to resume her regular home  medications.  She was given a prescription to take Percocet as needed  for pain and was provided 40 tablets.  She was also told to use Colace  as a stool softener.   DISCHARGE INSTRUCTIONS:  She was instructed to be ambulatory but   specifically told to refrain from any heavy lifting, strenuous activity,  or driving.  She was also instructed to refrain from sexual activity or  any vaginal penetration for the next 6 weeks.   FOLLOW UP:  She will follow up in approximately 2 weeks for further  postoperative evaluation.   ADDENDUM:  Of note, the patient's postvoid residual urines were checked  during her hospitalization.  Initially, these were moderately high  around 230 mL.  They were continued to be checked and subsequently  decreased to below 150 mL and the patient appeared to be voiding  subjectively well.      Heloise Purpura, MD  Electronically Signed     LB/MEDQ  D:  11/10/2008  T:  11/11/2008  Job:  [id number]
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
## 2011-03-10 NOTE — Procedures (Signed)
[name], [name]            ACCOUNT NO.:  [id number]   MEDICAL RECORD NO.:  [id number]           PATIENT TYPE:   LOCATION:                                 FACILITY:   PHYSICIAN:  Erick Colace, M.D.DATE OF BIRTH:  12-16-1965   DATE OF PROCEDURE:  10/18/2007  DATE OF DISCHARGE:                               OPERATIVE REPORT   PROCEDURES:  Medial branch blocks L1, L2, L3, left side under  fluoroscopic guidance.   INDICATIONS:  Status post L4-5, L5-S1 posterolateral fusion with pedicle  screws, with continued axial back pain.  She had repeat MRI done at  [facility] showing some facet degeneration at L3-4.   Her pain is only partially responsive to medication management.   Informed consent was obtained after describing the risks and benefits of  the procedure the patient.  These include bleeding, infection, temporary  or permanent paralysis.  She elects to proceed and has given written  consent.  The patient was placed prone on fluoroscopy table.  Betadine  prep and sterile drape.  A 25-gauge, 1-1/2 inch needle was used to  anesthetize the skin and subcutaneous tissue with 1% lidocaine x 2 mL.  Then, a 22-gauge, 3-1/2 inch spinal needle was inserted, first charting  the left L3 SAP transverse junction, bone contact made, confirmed with  lateral imaging.  Omnipaque 180 x 0.5 mL demonstrated no intravascular  uptake.  Then, 0.5 mL of a solution containing 1 mL of 40 mg/mL  dexamethasone and 2 mL of 2% MPF lidocaine.  Then, the left L2 SAP  transverse process junction targeted, bone contact made, confirmed with  lateral imaging.  Omnipaque 180 x 0.5 mL demonstrated no intravascular  uptake, and 0.5 mL of the dexamethasone/lidocaine solution was injected.  Then, the left L1 SAP transverse process junction targeted, bone contact  made, confirmed with lateral imaging.  Omnipaque 180 x 0.5 mL  demonstrated no intravascular uptake.  Then, 0.5 mL of  dexamethasone/lidocaine  solution was injected.  The patient tolerated  the procedure well.  Pre- and postinjection vitals stable.  She will  return in 3 weeks to see how she did with this.  If this was  particularly helpful, consider intraarticular L3-4.  If helpful but the  pain is shifting to the right side, would do right side or bilateral L1-  3 medial branches.      Erick Colace, M.D.  Electronically Signed     AEK/MEDQ  D:  10/18/2007 14:38:43  T:  10/19/2007 07:56:36  Job:  [id number]

## 2011-03-10 NOTE — Group Therapy Note (Signed)
REQUESTING PHYSICIAN:  Nelda Severe, MD.   REASON FOR CONSULTATION:  Low back pain, chronic, following lumbar  fusion.   CHIEF COMPLAINT:  Low back pain.   HISTORY:  A 45 year old female with several year history of back pain.  She had failed conservative therapy and therefore underwent L4-5, L5-S1  posterolateral fusion with autogenous graft, insertion of pedicle screws  L4-5 and S1.  She had no postoperative complications.  Pathology showed  no significant abnormalities of the disk.  She had some exacerbation of  pain in July 2008, going to ER.  Continued on Norco per Dr. Alveda Reasons as  well as started on some Soma. She went to physical therapy to get TENS  unit.  She had some early refills on her pain medications  noted on  July 28, 2007.  She had repeat MRI done at [facility], mainly  showing some facet arthritis L3-4 above the level of fusion, no  complications in the operative site.  She has had some problems with  sticking with her chronic analgesic medication schedule.   She rates her pain as an 8/10 but currently 9/10 described as sharp,  burning and activity.  Interferes with activity at an 8/10 level, worse  in the morning and evening.  Improves with rest, heat, ice and  medication.  Worse with walking, sitting and activity standing.  She  walks 20 minutes at a time.  She climbs steps.  She drives.  Last seen  August 21, 2007.  She has some difficulties with household duties such  as vacuuming.   REVIEW OF SYSTEMS:  Positive for depression, anxiety and spasms, sleep  apnea.   FAMILY PHYSICIAN:  Dr. Yetta Barre of [facility].  He started her on  Zoloft for depression and anxiety.   PAST SURGICAL HISTORY:  Laparoscopy in 1992.   SOCIAL HISTORY:  Divorced, lives with her daughter.   FAMILY HISTORY:  Heart disease, high blood pressure, alcohol abuse.   PHYSICAL EXAMINATION:  VITAL SIGNS:  Blood pressure 134/81, pulse 86,  respiratory rate 18, O2 saturation 99% on  room air.  GENERAL APPEARANCE:  No acute distress.  Mood and affect appropriate.  Gait is normal.  BACK:  Some mild tenderness at the lumbosacral junction but even more so  above the level of fusion around the L3-4 area.  She has pain with  extension but no pain with flexion.  NEUROLOGIC:  She has normal lower extremity strength, reflexes and  sensation.  Her upper extremity strength, reflexes and sensation are  normal as well.   IMPRESSION:  1. Lumbar post laminectomy syndrome.  2. Lumbar facet arthropathy above the level of fusion.   PLAN:  1. Will check urine drug screen.  2. In the interval time, will start on Tramadol two p.o. t.i.d.,      reduce her Zoloft to 100 mg daily given potential for serotonin      syndrome.  Went over symptoms of serotonin syndrome.  Have also      indicated that will likely assume her Norco prescription once urine      drug screen checks out.  If she fails a urine drug screen then we      will not be able to assist her with narcotic analgesic medication.      Will continue ibuprofen 800 t.i.d.  Schedule for medial branch      blocks L1, 2, 3.   Thank you for this interesting consultation.      Greig Castilla  Burman Riis, M.D.  Electronically Signed     AEK/MedQ  D:  10/04/2007 17:18:40  T:  10/05/2007 10:39:24  Job #:  [id number]
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
## 2011-03-10 NOTE — Op Note (Signed)
NAMEHALLIE, [name]            ACCOUNT NO.:  [id number]   MEDICAL RECORD NO.:  [id number]          PATIENT TYPE:  INP   LOCATION:  1426                         FACILITY:  [facility]   PHYSICIAN:  Excell Seltzer. Annabell Howells, M.D.    DATE OF BIRTH:  05/16/1966   DATE OF PROCEDURE:  11/07/2008  DATE OF DISCHARGE:                               OPERATIVE REPORT   PROCEDURE:  Cystoscopy and SPARC sling.   PREOPERATIVE DIAGNOSIS:  Stress incontinence.   POSTOPERATIVE DIAGNOSIS:  Stress incontinence.   SURGEON:  Excell Seltzer. Annabell Howells, M.D.   ANESTHESIA:  General.   SPECIMEN:  None.   DRAINS:  Foley catheter and vaginal pack.   COMPLICATIONS:  None.   INDICATIONS:  [name] is a 45 year old white female with stress  incontinence and anterior vault prolapse who is to undergo robotic  sacrocolpopexy by Dr. Laverle Patter today followed by a sling by me.   FINDINGS AND PROCEDURE:  The robotic sacrocolpopexy had been completed,  the patient was in the lithotomy position and had had a vaginal prep and  a Foley placed.  A weighted vaginal retractor was placed.  The anterior  vaginal wall was infiltrated with approximately 5 mL of 1% lidocaine  with epinephrine and an anterior midline vaginal incision was made over  the mid urethra.  The mucosa was elevated off the pubourethral fascia  allowing placement of a fingertip adjacent to the urethra.  Two separate  stab wounds were then made, one 2 cm to the right of midline and one 2  cm to the left of the midline just above the pubis.  The fat was spread  to the fascia in each of these incisions.  The [facility] trocar was then  passed on the right down to the top of the pubis, walked along the back  of the pubis, and then brought out the vaginal incision under digital  guidance, with care being taken to reflect the urethra to the  contralateral side.  This was then repeated on the left side.  Cystoscopy was then performed using the 22-French scope and 70-degree  lens.   Examination revealed no evidence of bladder wall injury.  There  was blue urine effluxing from both ureteral orifices.  Once cystoscopy  had confirmed no bladder injury, the bladder was drained.  The Foley  catheter was reinserted.  The [facility] mesh was then attached to the  trocars and brought back into the abdominal incisions per routine.  Cystoscopy was then repeated once again without evidence of bladder wall  injury.  The cystoscope was removed.  The Foley catheter was replaced  and the bladder was drained.  The abdominal ends of the mesh were cut  and the sheaths were removed after proper tension was applied.  Once  appropriate sling position was confirmed, the anterior vaginal wall was  closed using a running 2-0 Vicryl suture.  The abdominal ends of the  mesh were trimmed, allowing the ends to fall back into the subcutaneous  tissue.  The abdominal incisions were closed with Dermabond.  A 2-inch  Iodoform vaginal pack was placed, the Foley was  placed to straight drainage.  The patient was then taken down from  lithotomy position.  Her anesthetic was reversed.  She was moved to the  recovery room in stable condition.  There were no complications during  my portion of procedure.      Excell Seltzer. Annabell Howells, M.D.  Electronically Signed     JJW/MEDQ  D:  11/07/2008  T:  11/08/2008  Job:  [id number]   cc:   Heloise Purpura, MD  Fax: [phone]   Orion Modest, M.D.
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
## 2011-03-10 NOTE — Procedures (Signed)
[name], [name]            ACCOUNT NO.:  [id number]   MEDICAL RECORD NO.:  [id number]          PATIENT TYPE:  REC   LOCATION:  TPC                          FACILITY:  [facility]   PHYSICIAN:  Erick Colace, M.D.DATE OF BIRTH:  1966/02/02   DATE OF PROCEDURE:  12/05/2007  DATE OF DISCHARGE:                               OPERATIVE REPORT   PROCEDURE:  T12, L1, L2 medial branch blocks under fluoroscopic  guidance.   INDICATION:  Lumbar facet mediated pain above level L4-5, L5-S1 fusion.  She has had previous good relief with same procedure done approximately  6 weeks ago.  However has had an injury which appears to be  hyperextension.   Informed consent was obtained after describing risks.  Her pain limits  her walking is only partially responsive to medication management.   Informed consent was obtained after describing risks and benefits  procedure to the patient.  These include bleeding, bruising, infection,  loss of bowel bladder function, temporary or permanent paralysis.  She  elects proceed and has given written consent.   The patient placed prone on fluoroscopy table.  Betadine prep, sterile  drape 25-gauge 1-1/2 inch needle was used to anesthetize skin and subcu  tissue 1% lidocaine x2 mL. Then a 22 gauge 3-1/2 inch spinal needle was  inserted under fluoroscopic guidance first charting L3 and SAP  transverse junction left, AP and lateral imaging utilized.  Omnipaque  180 x 0.5 mL demonstrated no intravascular uptake then 0.5 mL of  solution containing 1 mL of 40 mg/mL dexamethasone and 2 mL of 2% MPF  lidocaine were injected.  The left L2 SAP transverse junction targeted  bone contact made confirmed with lateral imaging.  Omnipaque 180 x 0.5  mL demonstrated no intravascular uptake then 0.5 mL dexamethasone  lidocaine solution injected.  The left L1 SAP transverse process  junction targeted bone contact made confirmed with lateral imaging.  Omnipaque 180 x 0.5 mL  demonstrated no intravascular uptake. 0.5 mL of  dexamethasone lidocaine solution injected.  Same procedure repeated on  the corresponding levels on the right side using same needle injectate  and procedure. The patient tolerated procedure well.  Post injection  vitals stable.  Return in 1 month for follow-up.  If she only gets  temporary improvement in her pain, consider RF.  Have written for  hydrocodone 7.5/500 #20 to take one to two p.o. q.i.d. for next two days  should she have postprocedure soreness.      Erick Colace, M.D.  Electronically Signed     AEK/MEDQ  D:  12/05/2007 16:02:51  T:  12/06/2007 14:57:58  Job:  [id number]
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
## 2011-03-10 NOTE — Op Note (Signed)
[name], [name]            ACCOUNT NO.:  [id number]   MEDICAL RECORD NO.:  [id number]          PATIENT TYPE:  INP   LOCATION:  1426                         FACILITY:  [facility]   PHYSICIAN:  Heloise Purpura, MD      DATE OF BIRTH:  05/05/1966   DATE OF PROCEDURE:  11/07/2008  DATE OF DISCHARGE:                               OPERATIVE REPORT   PREOPERATIVE DIAGNOSES:  1. Vaginal vault prolapse.  2. Stress urinary incontinence.   POSTOPERATIVE DIAGNOSES:  1. Vaginal vault prolapse.  2. Stress urinary incontinence.   PROCEDURE:  Robotic assisted laparoscopic sacrocolpopexy.   SURGEON:  Heloise Purpura, MD   FIRST ASSISTANT:  Excell Seltzer. Annabell Howells, MD   SECOND ASSISTANT:  Delia Chimes, NP   ANESTHESIA:  General.   COMPLICATIONS:  None.   ESTIMATED BLOOD LOSS:  50 mL.   SPECIMENS:  None.   INDICATION:  [name] is a 45 year old female who has vaginal vault  prolapse status post a transabdominal hysterectomy.  She underwent an  evaluation by Dr. Bjorn Pippin including urodynamics which demonstrated  stress urinary incontinence and prolapse of the apex and anterior wall  of the vagina.  After discussion regarding management options for  treatment, she elected to proceed with a sacrocolpopexy as well as to  undergo a concomitant urethral sling under the care of Dr. Annabell Howells.  The  potential risks, complications, alternative treatment options were  discussed in detail with the patient and informed consent was obtained.   DESCRIPTION OF PROCEDURE:  The patient was taken to the operating room  and a general anesthetic was administered.  She was given preoperative  antibiotics, placed in the dorsal lithotomy position and prepped and  draped in the usual sterile fashion.  Next a preoperative time-out was  performed.  A Foley catheter was then inserted via the urethra into the  bladder.  A site was selected just superior to the umbilicus for  placement of the camera port.  This was placed  using a standard open  Hasson technique.  This allowed entry into the peritoneal cavity under  direct vision and without difficulty.  A 12 mm port was then placed and  a pneumoperitoneum established.  The 0 degree lens was used to inspect  the abdomen and there was no evidence for any intra-abdominal injuries  or other abnormalities.  The remaining ports were then placed.  Bilateral 8 mm robotic ports were placed 10 cm lateral to and just  inferior to the camera port site.  An additional 8 mm robotic port was  placed in the far left lateral abdominal wall.  A 12 mm port was placed  in the far right lateral abdominal wall.  All ports were placed under  direct vision and without difficulty.  The surgical cart was then  docked.  A fourth arm was used to retract the sigmoid colon laterally to  the left.  This nicely exposed the sacral promontory.  An EA sizer was  then placed into the vagina to support and identify the apex of the  vagina.  A horizontal incision was then made over the  peritoneal surface  of the apex of the vagina.  A peritoneal flap was then created  anteriorly and posteriorly on the vagina.  During this dissection, the  patient was noted to have significant scar tissue.  During the posterior  dissection there was an incision made into the vaginal lumen as a result  of the extensive scar tissue from her prior surgery.  This was readily  identified.  Once the remainder of the dissection was completed  posteriorly it was decided to repair the opening into the vagina.  This  was closed in two layers with a 2-0 Vicryl mucosal layer followed by an  imbricated 2-0 Vicryl over the original suture line.  The sacral  promontory was then identified and a vertical incision was made over the  peritoneum at this level.  This was carried down on to the periosteum of  the sacral promontory and the sacral promontory was exposed with care  taken to avoid any presacral veins.  A piece of the  IntePro Y-shaped  mesh was then prepared and cut to an appropriate length.  It was brought  in through the 12 mm port and the Y flaps of this mesh were then placed  on the anterior and posterior aspect of the vaginal cuff.  Gore-Tex 2-0  sutures were then used to secure the mesh to the vaginal cuff with  figure-of-eight sutures.  Five sutures were placed on the posterior  aspect of the vagina with four figure-of-eight sutures placed on the  anterior aspect of the vagina.  An appropriate amount of tension was  then brought onto the vaginal apex with tension then released.  This  allowed the vagina to sit in an appropriate anatomic position.  Once the  appropriate length of the mesh was identified it was secured to the  sacral promontory with three figure-of-eight 2-0 Gore-Tex sutures.  The  peritoneum was then closed over the mesh with 2-0 Vicryl running  sutures.  This obliterated the right side of the cul-de-sac as well.  The left side of the cul-de-sac was then obliterated with a 2-0 Vicryl  suture to reapproximate the peritoneum in a Production assistant, radio.   At this point the surgical cart was undocked.  Rigid cystoscopy was  performed by Dr. Annabell Howells and will be dictated by him.  This did  demonstrate blue efflux from each ureteral orifice after the  administration of indigo carmine.  In addition, there appeared to be  good support of the apex of the vagina as well as good anterior support.  The 12 mm port sites were then closed with the zero Vicryl sutures used  to close the fascia.  These were preplaced with the suture passer  device.  All remaining ports were removed under direct vision.  The  pneumoperitoneum was expelled prior to removal of the ports.  The skin  was then reapproximated at all port sites with 4-0 Vicryl subcuticular  sutures.  Dermabond was then applied to the skin incisions.  The patient  did undergo a concomitant urethral sling repair which will be dictated  by Dr.  Annabell Howells.  A Foley catheter was inserted at the end of the procedure  along with vaginal packing.  The patient was able to be extubated and  transferred to the recovery unit in satisfactory condition.      Heloise Purpura, MD  Electronically Signed     LB/MEDQ  D:  11/07/2008  T:  11/08/2008  Job:  [id number]
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
## 2011-03-10 NOTE — Assessment & Plan Note (Signed)
The patient returns today.  Review of urine drug screen shows positive  cocaine.  She did have positive oxycodone which she states she obtained  from her sister.   Pain level 10/10, low back.  No radicular pain.   PHYSICAL EXAMINATION:  Gait is normal.  Mood and affect appropriate.  Sensorium is unclouded.   Discussed UDS results.  Offered referral to [facility].  She  states she does not know how the medication got there nor how the  cocaine got there.  Explained that she will be on non-narcotic treatment  from here on out.  I would be willing to do injection with her today.  I  gave her a prescription for tramadol.  I will see her back in 1 month.      Erick Colace, M.D.  Electronically Signed     AEK/MedQ  D:  02/20/2008 13:57:22  T:  02/20/2008 14:09:20  Job #:  [id number]

## 2011-03-13 NOTE — H&P (Signed)
NAMEATTICUS, [name]            ACCOUNT NO.:  [id number]   MEDICAL RECORD NO.:  [id number]          PATIENT TYPE:  INP   LOCATION:  NA                           FACILITY:  [facility]   PHYSICIAN:  Lianne Cure, P.A.  DATE OF BIRTH:  04-May-1966   DATE OF ADMISSION:  DATE OF DISCHARGE:                              HISTORY & PHYSICAL   SUBJECTIVE:  She comes in for a history and physical.   Preoperative diagnosis is lumbar spondylosis.  She has had lumbar pain  for quite some time.  She has been treated with injections, pain  medication.  She has acquired anxiety and depression since being  injured.   DRUG ALLERGY INCLUDES MORPHINE, IMITREX.   Current medications include:  1. Norco 10/325 one to two every 4 hours as needed for pain.  2. Zoloft 50 mg once daily.  3. Zanaflex 4 mg 3 times daily as needed.   Past history includes:  1. Arthritis.  2. Anxiety.  3. Depression.   Past surgical history includes:  1. Hysterectomy.  2. Laparoscopy.   Family history includes:  1. Stroke.  2. Coronary artery disease.  3. Hypertension.  4. Kidney stones.  5. Cancer.   On review of systems, she reports no changes in vision or hearing.  No  hoarseness.  No chronic cough.  No shortness of breath.  No fever.  No  chills.  No chest pain.  No difficulty swallowing.  No stomach pain.  No  nausea, vomiting, diarrhea.  No frequent headaches.  No seizures.  No  blackouts.  She does have trouble sleeping and depression.   Today, on physical exam, temperature is 98.3, pulse 60, respirations 18,  blood pressure 140/80.  She appears well-developed, well-nourished, normocephalic, slightly  obese.  Pupils are equal, round, reactive to light.  Neck is supple.  No adenopathy noted.  Chest is clear to auscultation bilaterally.  No wheezes.  HEART:  Regular rate and rhythm.  No murmur.  Abdomen is soft, nontender to palpation.  Positive bowel sounds  auscultated.  Distally, strength is a 5/5.   Reflexes, knee jerks, and ankle jerks are  intact.  Sensation to light touch is grossly intact and equal.  She has tenderness to palpation of the lumbar spine L4-5, 5-1.  Skin is clean, dry, and intact.   DIAGNOSIS:  Lumbar spondylosis L4-5, 5-1.   PLAN:  Posterior fusion with iliac crest bone graft L4-5, 5-1 by Dr.  Nelda Severe.      Lianne Cure, P.A.     MC/MEDQ  D:  01/17/2007  T:  01/18/2007  Job:  [id number]

## 2011-03-13 NOTE — Op Note (Signed)
NAMERYAN, PALERMO NO.:  [id number]   MEDICAL RECORD NO.:  [id number]          PATIENT TYPE:  INP   LOCATION:  5033                         FACILITY:  [facility]   PHYSICIAN:  Nelda Severe, MD      DATE OF BIRTH:  07/07/1966   DATE OF PROCEDURE:  01/19/2007  DATE OF DISCHARGE:  01/25/2007                               OPERATIVE REPORT   SURGEON:  Nelda Severe, M.D.   ASSISTANT:  Lianne Cure, P.A.-C.   PREOPERATIVE DIAGNOSIS:  Lumbar disc degeneration.   POSTOPERATIVE DIAGNOSIS:  Lumbar disc degeneration.   OPERATION:  Posterior interbody fusion (PLIF) L5-S1 with interbody cage  and autogenous graft; posterolateral fusion L4-L5 and L5-S1 with  autogenous graft; insertion of pedicle screws at L4, L5, and S1  bilaterally; harvest iliac crest graft, right side.   NOTE:  This dictation is, unfortunately, being done with significant  delay.  I actually was informed that the dictation had not been  performed about a week after the surgery date, I was relatively certain  I had dictated, then was informed I had, and subsequently that I had  not.   OPERATIVE PROCEDURE:  The patient was placed under general endotracheal  anesthesia.  Intravenous antibiotics had been administered  prophylactically.  A Foley catheter was placed in the bladder.  Sequential compression devices were placed on both lower extremities.  The patient was positioned prone on a Jackson table.  Care was taken to  position the upper extremities so as to avoid hyperflexion and abduction  of the shoulders and so as to avoid hyperflexion of the elbows.  The  upper extremities were padded with foam from axilla to hands.  The  thighs, knees, shins and ankles were supported on pillows.  The lumbar  area was prepared using DuraPrep.  Drapes were applied in rectangular  fashion and secured with Ioban.   A midline lumbar incision was made into the dermis and then the  subcutaneous tissue injected  with a mixture of 0.25% Marcaine with  epinephrine and 1% lidocaine.  Dissection was then carried down to the  spinous processes and the muscle mobilized bilaterally from the  transverse process of L4 proximally to the ala of the sacrum distally on  both sides.  Cross table lateral radiograph was taken with a Kocher in  place.   Pedicle holes were made bilaterally at S1, L5, and L4 in the following  fashion:  A small piece of the base of the supra-articular process was  removed with a Leksell rongeur.  The poster pedicle was identified,  perforated with an awl, and then either a pedicle probe or 3.5 mm drill  bit used to make a hole through the pedicle into the vertebral body.  Each hole was carefully palpated with a ball tip probe to insure it was  circumferentially intact and sounded for depths and the depths recorded.  Radiopaque markers were placed in each hole after injecting the holes  with FloSeal.  Cross table lateral radiograph was then ordered and  showed satisfactory position of markers.   We then harvested a right posterior iliac  crest graft through a separate  oblique incision.  The incision was made just lateral to the posterior  iliac crest.  An incision was made down of the gluteal fascia which was  then detached from the outer lip of the superior iliac crest  posteriorly.  The muscle was elevated away from the ilium.  A moderate  quantity of graft was then harvested.  The bony defect was then smeared  with Gelfoam to control bleeding.  The fascia was then reapposed using  interrupted figure-of-eight Vicryl sutures.   Next, we decorticated the ala of the sacrum, transverse process of L5  and of L4 on the right side.  Graft was then packed in posterolaterally.  We then placed pedicle screws at L4, L5, and S1.  The length of the  screw was based upon the depth measurement of the hole.  Each screw was  then stimulated and current levels required to evoke distal EMG  activity  reported.  These were all in a safe  zone.  This meant that there was  very little likelihood of a screw thread being in contact with a nerve.  Appropriate length titanium rod was then attached to the screws.   The same procedure was carried out on the left side, decorticating the  ala sacrum and the transverse processes at L5 and L4.  Screws were then  placed and stimulated. Once again, the EMG recordings distally suggested  very low probability of any contact between metal screw and nerve.   We then proceeded to perform a facetectomy at L5-S1 on the left side.  The veins over the lateral portion of the disc were coagulated using  bipolar coagulation.  The lateral edge of the dura was then retracted  medially and a cottonoid patty placed between the disc and the exiting  L5 nerve root above.  The disc was then fenestrated.  Curets were then  used to enucleate the disc along with pituitary rongeurs.   A short rod was then attached between L5 and S1 on the left side and  distracted to open the disc space more.  When the disc space had been  adequately prepared, bone graft, which had been harvested locally as  well as that harvested from the iliac crest, was packed into the disc  space anteriorly.  Appropriate sized interbody fusion device (cage) was  then loaded with graft and impacted into place.  Distraction was then  taken off the disc space so that the interbody fusion device would  decompress.  We then attached a contoured rod between S1 and L5 and L4  proximally.  Cross table lateral radiograph showed satisfactory position  of implants.  On the cross table lateral, it was difficult to locate the  radiopaque marker in the interbody fusion cage at L5-S1, however, by  direct inspection, I knew that we were well countersunk into the disc  space.   The couplings between screws and rods were then torqued.  The wound was then closed using continuous #1 Vicryl in the  thoracolumbar fascia.  Prior to closing that layer, a 15 gauge Blake drain was placed in the  subfascial layer.  A 1/8 inch Hemovac drain was then placed in the  subcutaneous layer and brought out through the bone graft harvest site  on the right side.  The subcutaneous layer of both wounds was then  closed using interrupted and continuous 2-0 Vicryl suture.  The skin was  closed using continuous 3-0 undyed Vicryl in  subcuticular fashion.  The  skin edges were reinforced with Steri-Strips and an antibiotic ointment  dressing applied and secured with an OpSite.  Prior to applying the  dressing, both drains were secured with 2-0 nylon basket weave type  suture.   There were no intraoperative complications.  The sponge and needle  counts were correct.  The patient returned to the recovery room in  satisfactory condition.      Nelda Severe, MD  Electronically Signed     MT/MEDQ  D:  02/04/2007  T:  02/04/2007  Job:  [id number]

## 2011-03-13 NOTE — Discharge Summary (Signed)
NAMETRANIECE, BOFFA            ACCOUNT NO.:  [id number]   MEDICAL RECORD NO.:  [id number]          PATIENT TYPE:  INP   LOCATION:  5033                         FACILITY:  [facility]   PHYSICIAN:  Nelda Severe, MD      DATE OF BIRTH:  10-20-1966   DATE OF ADMISSION:  01/19/2007  DATE OF DISCHARGE:  01/25/2007                               DISCHARGE SUMMARY   This woman was admitted for management of disabling axial back pain.  She has a diagnosis of lumbar disk degeneration and spondylosis at L4-5  and L5-S1.   The day of admission, she was taken to the operating room where L4-5, L5-  S1 fusion and bone graft harvest was carried out.  Postoperatively, she  had no medical complications, but pain control was an issue.  She  ambulated quite well, but was very anxious.  She did have difficulty  with perineal hygiene and there was seen by the occupational therapist  with regards to this request.   At the present time, her wound is clean without drainage, erythema or  swelling.  Steri-Strips were removed today.  She is ambulatory  independently with a walker and can get herself in and out of bed into  the bathroom on her own.   She is being given prescriptions for Norco 10 one to two q.4h. p.r.n.,  maximum 6 per day, 120 tablets, and Robaxin 500 mg one p.o. q.6h. p.r.n.  100 tablets.   She has been instructed to call the office for an appointment to be seen  in about 3 week's time.  Additionally, she is to call me if there is any  problem with persistent wound drainage or fever.   She is to avoid all bending and lifting.  She may shower.   DIAGNOSIS:  Lumbar spondylosis and disk degeneration.   DISCUSSION:  She is stable at the present time.  See above for discharge  instructions, medication, etc.      Nelda Severe, MD  Electronically Signed     MT/MEDQ  D:  01/25/2007  T:  01/25/2007  Job:  [id number]

## 2011-04-15 NOTE — Discharge Summary (Signed)
[name], [name]            ACCOUNT NO.:  [id number]  MEDICAL RECORD NO.:  [id number]  LOCATION:  1610                          FACILITY:  [facility]  PHYSICIAN:  Franchot Gallo, MD     DATE OF BIRTH:  07-27-1966  DATE OF ADMISSION:  02/14/2011 DATE OF DISCHARGE:  02/26/2011                              DISCHARGE SUMMARY   REASON FOR ADMISSION:  This was a 45 year old female who was admitted with a history of suicidal thoughts.  She reported compliance with her medications.  She was reporting symptoms of depression and hopelessness. Reports having recent conflict with a 61 year old.  FINAL IMPRESSION:  AXIS I:  Major depressive disorder, severe; history of posttraumatic stress disorder (PTSD). AXIS II:  No diagnosis. AXIS III:  History of interstitial cystitis, rotator cuff injury. AXIS IV:  Problems with her daughter, medical problems, and other psychosocial problems. AXIS V:  55.  PERTINENT LABS:  Alcohol level is less than 5.  Urine drug screen is positive for benzodiazepines.  White count was elevated at 17.8.  SIGNIFICANT FINDINGS:  The patient was admitted to the adult milieu in the mood disorder group.  She was feeling very anxious, she was asking for help, wanting to get back on her Ativan and her pain medications, which were clarified.  We contacted the patient's friend to assess support and safety concerns, and we offered education on suicide prevention.  The patient was reporting problems with low energy, and was endorsing suicidal thoughts, but states she will not act on them.  She was having thoughts to cut herself.  She was having problems with sleep and appetite, having moderate to severe depressive symptoms.  Denied any suicidal or homicidal thoughts.  We increased the patient's Percocet to help with her shoulder and back pain, discontinued her Celexa, and started Zoloft for her depressive symptoms.  The patient was sleeping better, but was having problems  with daytime sedation.  We decreased her Neurontin to lessen sedation.  Her sleep had improved.  Her appetite was improving.  She was having no psychotic symptoms.  We increased her Zoloft and increased her Neurontin.  She was reporting suicidal thoughts, but was able to contract for safety on the unit.  Continued to report problems with her sleep, although her appetite was satisfactory. She was having no side effects to her medications.  The patient was participating in groups.  We increased the patient's Neurontin to help with her pain issues.  She continued to endorse occasional suicidal thoughts.   We optimized the patient's Zoloft to 200 mg to address her depressive symptoms,  increased her Neurontin to help with her pain, and continued to monitor her mood and affect.  The day prior to discharge the patient's sleep was better, her appetite was good, her depression was baseline, her anxiety was under fair control.   On the day of discharge the patient's sleep was good, her appetite was good, having mild to moderate depressive symptoms.  States she is at her baseline.  No suicidal or homicidal thoughts.  No auditory visualizations.  Anxiety was under good control, and she felt ready for discharge.  DISCHARGE MEDICATIONS:  Neurontin 600 mg one q.i.d., Zoloft 100  mg taking 2 daily, Wellbutrin XL 1 tablet daily.  The patient was discharged on Adderall and Percocet.  The patient was to stop taking her Ativan.  FOLLOWUP:  Her followup appointment was with Ellis Savage, FNP, on Friday, 02/27/2011, phone number [phone], at [facility].     Landry Corporal, N.P.   ______________________________ Franchot Gallo, MD    JO/MEDQ  D:  04/15/2011  T:  04/15/2011  Job:  [id number]  Electronically Signed by Limmie PatriciaP. on 04/15/2011 02:58:26 PM Electronically Signed by Franchot Gallo MD on 04/15/2011 04:54:29 PM

## 2011-06-15 ENCOUNTER — Emergency Department (HOSPITAL_COMMUNITY)
Admission: EM | Admit: 2011-06-15 | Discharge: 2011-06-15 | Disposition: A | Discharge status: Home / Self Care | Payer: Self-pay | Attending: Emergency Medicine | Admitting: Emergency Medicine

## 2011-06-15 ENCOUNTER — Emergency Department (HOSPITAL_COMMUNITY): Payer: Self-pay

## 2011-06-15 DIAGNOSIS — R05 Cough: Secondary | ICD-10-CM | POA: Insufficient documentation

## 2011-06-15 DIAGNOSIS — R059 Cough, unspecified: Secondary | ICD-10-CM | POA: Insufficient documentation

## 2011-06-15 DIAGNOSIS — J40 Bronchitis, not specified as acute or chronic: Secondary | ICD-10-CM | POA: Insufficient documentation

## 2011-07-22 LAB — DRUGS OF ABUSE SCREEN W/O ALC, ROUTINE URINE
Benzodiazepines.: POSITIVE — AB
Cocaine Metabolites: POSITIVE — AB
Creatinine,U: 154.1
Propoxyphene: NEGATIVE

## 2011-07-22 LAB — COMPREHENSIVE METABOLIC PANEL
ALT: 12
AST: 15
Alkaline Phosphatase: 78
CO2: 30
Calcium: 9.3
Chloride: 102
GFR calc non Af Amer: >60
Glucose, Bld: 103 — ABNORMAL HIGH
Potassium: 3.6
Sodium: 142
Total Bilirubin: 0.6

## 2011-07-22 LAB — AMPHETAMINES URINE CONFIRMATION
Amphetamines: 5600 ng/mL
Methamphetamine GC/MS, Ur: NEGATIVE
Methylenedioxyamphetamine: NEGATIVE

## 2011-07-22 LAB — URINALYSIS, ROUTINE W REFLEX MICROSCOPIC
Bilirubin Urine: NEGATIVE
Ketones, ur: NEGATIVE
Nitrite: NEGATIVE
Urobilinogen, UA: 0.2

## 2011-07-22 LAB — CBC
Hemoglobin: 13.5
MCHC: 34.3
RBC: 4.29
WBC: 12.4 — ABNORMAL HIGH

## 2011-07-22 LAB — BENZODIAZEPINE, QUANTITATIVE, URINE
Alprazolam (GC/LC/MS), ur confirm: NEGATIVE
Oxazepam GC/MS Conf: NEGATIVE

## 2011-07-22 LAB — COCAINE, URINE, CONFIRMATION: Benzoylecgonine GC/MS Conf: 350 ng/mL

## 2011-07-22 LAB — URINE MICROSCOPIC-ADD ON

## 2011-07-22 LAB — TSH: TSH: 0.272 — ABNORMAL LOW

## 2011-07-23 LAB — URINALYSIS, ROUTINE W REFLEX MICROSCOPIC
Bilirubin Urine: NEGATIVE
Bilirubin Urine: NEGATIVE
Hgb urine dipstick: NEGATIVE
Ketones, ur: NEGATIVE
Ketones, ur: NEGATIVE
Nitrite: NEGATIVE
Nitrite: NEGATIVE
Protein, ur: NEGATIVE
Specific Gravity, Urine: 1.017
Urobilinogen, UA: 0.2
Urobilinogen, UA: 0.2
pH: 6

## 2011-07-23 LAB — DIFFERENTIAL
Basophils Absolute: 0.1
Basophils Relative: 1
Eosinophils Absolute: 0.5
Eosinophils Relative: 5
Monocytes Absolute: 1.2 — ABNORMAL HIGH

## 2011-07-23 LAB — POCT I-STAT, CHEM 8
Calcium, Ion: 1.11 — ABNORMAL LOW
Chloride: 101
Chloride: 103
Creatinine, Ser: 0.9
Glucose, Bld: 87
HCT: 37
Potassium: 3.5

## 2011-07-23 LAB — CBC
HCT: 35.7 — ABNORMAL LOW
Hemoglobin: 12.2
MCHC: 34.2
Platelets: 326
RDW: 12.6

## 2011-07-23 LAB — URINE CULTURE

## 2011-07-24 LAB — URINE MICROSCOPIC-ADD ON

## 2011-07-24 LAB — URINALYSIS, ROUTINE W REFLEX MICROSCOPIC
Glucose, UA: NEGATIVE
Leukocytes, UA: NEGATIVE
Specific Gravity, Urine: 1.013
Urobilinogen, UA: 0.2

## 2011-07-29 ENCOUNTER — Emergency Department (HOSPITAL_COMMUNITY)
Admission: EM | Admit: 2011-07-29 | Discharge: 2011-07-29 | Disposition: A | Discharge status: Home / Self Care | Payer: Medicaid Other | Attending: Emergency Medicine | Admitting: Emergency Medicine

## 2011-07-29 DIAGNOSIS — M545 Low back pain, unspecified: Secondary | ICD-10-CM | POA: Insufficient documentation

## 2011-07-29 DIAGNOSIS — M549 Dorsalgia, unspecified: Secondary | ICD-10-CM | POA: Insufficient documentation

## 2011-08-07 LAB — COMPREHENSIVE METABOLIC PANEL
AST: 13
BUN: 6
CO2: 26
Calcium: 9.1
Chloride: 101
Creatinine, Ser: 0.85
GFR calc Af Amer: >60
GFR calc non Af Amer: >60
Total Bilirubin: 0.6

## 2011-08-07 LAB — CBC
MCV: 89.7
Platelets: 450 — ABNORMAL HIGH
RBC: 4.69
WBC: 14.2 — ABNORMAL HIGH

## 2011-08-07 LAB — URINE MICROSCOPIC-ADD ON

## 2011-08-07 LAB — DIFFERENTIAL
Lymphocytes Relative: 20
Lymphs Abs: 2.9
Monocytes Relative: 6
Neutro Abs: 10.4 — ABNORMAL HIGH
Neutrophils Relative %: 73

## 2011-08-07 LAB — BASIC METABOLIC PANEL
Calcium: 9.3
Chloride: 104
Creatinine, Ser: 0.74
GFR calc Af Amer: >60
GFR calc non Af Amer: >60

## 2011-08-07 LAB — URINALYSIS, ROUTINE W REFLEX MICROSCOPIC
Glucose, UA: NEGATIVE
Glucose, UA: NEGATIVE
Ketones, ur: NEGATIVE
Leukocytes, UA: NEGATIVE
Nitrite: NEGATIVE
Protein, ur: NEGATIVE
Specific Gravity, Urine: 1.012
Urobilinogen, UA: 0.2
pH: 5.5

## 2011-08-07 LAB — RAPID URINE DRUG SCREEN, HOSP PERFORMED
Benzodiazepines: POSITIVE — AB
Tetrahydrocannabinol: NOT DETECTED

## 2011-08-07 LAB — ETHANOL: Alcohol, Ethyl (B): <5

## 2011-08-07 LAB — PREGNANCY, URINE: Preg Test, Ur: NEGATIVE

## 2011-09-25 ENCOUNTER — Emergency Department (HOSPITAL_COMMUNITY)
Admission: EM | Admit: 2011-09-25 | Discharge: 2011-09-25 | Discharge status: Against Medical Advice | Payer: Self-pay | Attending: Emergency Medicine | Admitting: Emergency Medicine

## 2011-09-25 DIAGNOSIS — M549 Dorsalgia, unspecified: Secondary | ICD-10-CM | POA: Insufficient documentation

## 2011-09-25 DIAGNOSIS — R42 Dizziness and giddiness: Secondary | ICD-10-CM | POA: Insufficient documentation

## 2011-09-25 DIAGNOSIS — R1110 Vomiting, unspecified: Secondary | ICD-10-CM | POA: Insufficient documentation

## 2011-09-25 NOTE — ED Notes (Signed)
Pt states that her daughters has an emergency at school, and she will have to leave to pick her up. Pt signed AMA papers.

## 2011-09-29 ENCOUNTER — Emergency Department (HOSPITAL_COMMUNITY)
Admission: EM | Admit: 2011-09-29 | Discharge: 2011-09-29 | Disposition: A | Discharge status: Home / Self Care | Payer: Medicaid Other | Attending: Emergency Medicine | Admitting: Emergency Medicine

## 2011-09-29 ENCOUNTER — Encounter: Payer: Self-pay | Admitting: *Deleted

## 2011-09-29 DIAGNOSIS — B9689 Other specified bacterial agents as the cause of diseases classified elsewhere: Secondary | ICD-10-CM | POA: Insufficient documentation

## 2011-09-29 DIAGNOSIS — R10819 Abdominal tenderness, unspecified site: Secondary | ICD-10-CM | POA: Insufficient documentation

## 2011-09-29 DIAGNOSIS — N760 Acute vaginitis: Secondary | ICD-10-CM | POA: Insufficient documentation

## 2011-09-29 DIAGNOSIS — N390 Urinary tract infection, site not specified: Secondary | ICD-10-CM | POA: Insufficient documentation

## 2011-09-29 DIAGNOSIS — A499 Bacterial infection, unspecified: Secondary | ICD-10-CM | POA: Insufficient documentation

## 2011-09-29 HISTORY — DX: Post-traumatic stress disorder, unspecified: F43.10

## 2011-09-29 HISTORY — DX: Major depressive disorder, single episode, unspecified: F32.9

## 2011-09-29 HISTORY — DX: Attention-deficit hyperactivity disorder, unspecified type: F90.9

## 2011-09-29 HISTORY — DX: Acute parametritis and pelvic cellulitis: N73.0

## 2011-09-29 HISTORY — DX: Depression, unspecified: F32.A

## 2011-09-29 HISTORY — DX: Chlamydial infection, unspecified: A74.9

## 2011-09-29 LAB — URINE MICROSCOPIC-ADD ON

## 2011-09-29 LAB — URINALYSIS, ROUTINE W REFLEX MICROSCOPIC
Nitrite: NEGATIVE
Protein, ur: NEGATIVE mg/dL
Specific Gravity, Urine: 1.03 (ref 1.005–1.030)
Urobilinogen, UA: 1 mg/dL (ref 0.0–1.0)

## 2011-09-29 LAB — WET PREP, GENITAL

## 2011-09-29 LAB — PREGNANCY, URINE: Preg Test, Ur: NEGATIVE

## 2011-09-29 MED ORDER — CIPROFLOXACIN HCL 500 MG PO TABS: 500.0000 mg | ORAL_TABLET | Freq: Two times a day (BID) | ORAL | Status: AC

## 2011-09-29 MED ORDER — OXYCODONE-ACETAMINOPHEN 5-325 MG PO TABS: 2.0000 | ORAL_TABLET | ORAL | Status: AC | PRN

## 2011-09-29 MED ORDER — OXYCODONE-ACETAMINOPHEN 5-325 MG PO TABS
2.0000 | ORAL_TABLET | Freq: Once | ORAL | Status: AC
  Administered 2011-09-29: 2 via ORAL
  Filled 2011-09-29: qty 1

## 2011-09-29 MED ORDER — KETOROLAC TROMETHAMINE 60 MG/2ML IM SOLN
60.0000 mg | Freq: Once | INTRAMUSCULAR | Status: AC
  Administered 2011-09-29: 60 mg via INTRAMUSCULAR
  Filled 2011-09-29: qty 2

## 2011-09-29 MED ORDER — METRONIDAZOLE 500 MG PO TABS: 500.0000 mg | ORAL_TABLET | Freq: Two times a day (BID) | ORAL | Status: AC

## 2011-09-29 NOTE — ED Notes (Signed)
Pt states "I have a hx of cysts on my ovaries, I've had a hysterectomy, I've had PID & chlyamdia before, I could have an STD, I don't fully trust my boyfriend right now"

## 2011-09-29 NOTE — ED Provider Notes (Signed)
History     CSN: [id number] Arrival date & time: 09/29/2011 12:14 PM   First MD Initiated Contact with Patient 09/29/11 1336      Chief Complaint  Patient presents with  . Abdominal Pain    (Consider location/radiation/quality/duration/timing/severity/associated sxs/prior treatment) The history is provided by the patient.    PT presents to the ER with complaints of abdominal pain, she says that she does not have a uterus but has a history of cysts on her ovaries and had PID in the past. However, since then pt has had a hysterectomy of her uterus. Pt is unsure of the status of her ovaries. She has one sexual partner but is unsure if he is her only sexual partner. She says that she has been  Past Medical History  Diagnosis Date  . PID (acute pelvic inflammatory disease)   . Chlamydia   . Depression   . PTSD (post-traumatic stress disorder)   . ADD (attention deficit disorder with hyperactivity)     Past Surgical History  Procedure Date  . Back surgery     dbl lumbar fusion L5-S1  . Bladder surgery   . Abdominal hysterectomy   . Laparoscopy     No family history on file.  History  Substance Use Topics  . Smoking status: Current Everyday Smoker -- 0.5 packs/day  . Smokeless tobacco: Not on file  . Alcohol Use: No    OB History    Grav Para Term Preterm Abortions TAB SAB Ect Mult Living                  Review of Systems  All other systems reviewed and are negative.    Allergies  Imitrex; Clindamycin/lincomycin; Morphine and related; and Vicodin  Home Medications   Current Outpatient Rx  Name Route Sig Dispense Refill  . ACETAMINOPHEN 500 MG PO TABS Oral Take 500 mg by mouth every 6 (six) hours as needed. pain     . ALPRAZOLAM 1 MG PO TABS Oral Take 1 mg by mouth 3 (three) times daily as needed. anxiety    . AMPHETAMINE-DEXTROAMPHETAMINE 20 MG PO TABS Oral Take 20 mg by mouth 3 (three) times daily as needed.      . BUPROPION HCL ER (XL) 300 MG PO TB24  Oral Take 300 mg by mouth daily.      . IBUPROFEN 200 MG PO TABS Oral Take 200 mg by mouth every 6 (six) hours as needed. pain     . SERTRALINE HCL 100 MG PO TABS Oral Take 200 mg by mouth daily.        BP 138/74  Pulse 76  Temp(Src) 98.7 F (37.1 C) (Oral)  Resp 20  Wt 225 lb (102.059 kg)  SpO2 99%  Physical Exam  Nursing note and vitals reviewed. Constitutional: She is oriented to person, place, and time. She appears well-developed and well-nourished.  HENT:  Head: Normocephalic and atraumatic.  Eyes: Pupils are equal, round, and reactive to light.  Neck: Normal range of motion. Neck supple.  Cardiovascular: Normal rate.   Pulmonary/Chest: Effort normal and breath sounds normal.  Abdominal: Soft. There is tenderness (suprapubic tenderness).  Genitourinary: Vaginal discharge found.  Musculoskeletal: Normal range of motion.  Neurological: She is alert and oriented to person, place, and time.  Skin: Skin is warm and dry.    ED Course  Procedures (including critical care time)  Labs Reviewed  URINALYSIS, ROUTINE W REFLEX MICROSCOPIC - Abnormal; Notable for the following:  Color, Urine AMBER (*) BIOCHEMICALS MAY BE AFFECTED BY COLOR   APPearance CLOUDY (*)    Hgb urine dipstick SMALL (*)    Bilirubin Urine SMALL (*)    Ketones, ur 15 (*)    Leukocytes, UA SMALL (*)    All other components within normal limits  PREGNANCY, URINE  URINE MICROSCOPIC-ADD ON  WET PREP, GENITAL  GC/CHLAMYDIA PROBE AMP, GENITAL   No results found.   No diagnosis found.    MDM   Pts symptoms and lab results are consistent for a urinary tract infection. Also, come clue cells were found on wet prep and discharge noted on exam. Will treat for BV and and UTI with abx and pain medication.        Dorthula Matas, PA 09/29/11 [phone]

## 2011-09-30 LAB — GC/CHLAMYDIA PROBE AMP, GENITAL: GC Probe Amp, Genital: NEGATIVE

## 2011-09-30 NOTE — ED Provider Notes (Signed)
Medical screening examination/treatment/procedure(s) were performed by non-physician practitioner and as supervising physician I was immediately available for consultation/collaboration.  Raeford Razor, MD 09/30/11 [phone]

## 2011-11-07 ENCOUNTER — Encounter (HOSPITAL_COMMUNITY): Payer: Self-pay | Admitting: Emergency Medicine

## 2011-11-07 ENCOUNTER — Emergency Department (HOSPITAL_COMMUNITY)
Admission: EM | Admit: 2011-11-07 | Discharge: 2011-11-07 | Disposition: A | Discharge status: Home / Self Care | Payer: Medicaid Other | Attending: Emergency Medicine | Admitting: Emergency Medicine

## 2011-11-07 DIAGNOSIS — R109 Unspecified abdominal pain: Secondary | ICD-10-CM | POA: Insufficient documentation

## 2011-11-07 DIAGNOSIS — R300 Dysuria: Secondary | ICD-10-CM | POA: Insufficient documentation

## 2011-11-07 DIAGNOSIS — R102 Pelvic and perineal pain: Secondary | ICD-10-CM

## 2011-11-07 DIAGNOSIS — F17200 Nicotine dependence, unspecified, uncomplicated: Secondary | ICD-10-CM | POA: Insufficient documentation

## 2011-11-07 DIAGNOSIS — F411 Generalized anxiety disorder: Secondary | ICD-10-CM | POA: Insufficient documentation

## 2011-11-07 LAB — URINE MICROSCOPIC-ADD ON

## 2011-11-07 LAB — URINALYSIS, ROUTINE W REFLEX MICROSCOPIC
Glucose, UA: NEGATIVE mg/dL
Ketones, ur: NEGATIVE mg/dL
Leukocytes, UA: NEGATIVE
Protein, ur: NEGATIVE mg/dL
Urobilinogen, UA: 0.2 mg/dL (ref 0.0–1.0)

## 2011-11-07 LAB — WET PREP, GENITAL: Yeast Wet Prep HPF POC: NONE SEEN

## 2011-11-07 MED ORDER — OXYCODONE-ACETAMINOPHEN 5-325 MG PO TABS: 1.0000 | ORAL_TABLET | Freq: Four times a day (QID) | ORAL | Status: AC | PRN

## 2011-11-07 MED ORDER — OXYCODONE-ACETAMINOPHEN 5-325 MG PO TABS
2.0000 | ORAL_TABLET | Freq: Once | ORAL | Status: AC
  Administered 2011-11-07: 2 via ORAL
  Filled 2011-11-07: qty 2

## 2011-11-07 NOTE — ED Notes (Signed)
JXB:[id number]<GN> Expected date:11/07/11<BR> Expected time: 6:08 AM<BR> Means of arrival:Ambulance<BR> Comments:<BR> Abd pain

## 2011-11-07 NOTE — ED Provider Notes (Signed)
History     CSN: [id number]  Arrival date & time 11/07/11  0610   First MD Initiated Contact with Patient 11/07/11 [phone]      Chief Complaint  Patient presents with  . Dysuria    Scant amount of urine painful urination since Tuesday    (Consider location/radiation/quality/duration/timing/severity/associated sxs/prior treatment) HPI Comments: Patient reports that she has had increased urinary frequency and urgency along with suprapubic pain for the past 4 days.  She reports that the pain is becoming progressively worse and feels like a pressure.  She denies dysuria or hematuria.  PMH significant for ovarian cysts and interstitial cystitis.  She reports that her symptoms feel similar to when she has has flare ups of interstitial cystitis in the past.  She has seen Dr. [facility] in the past.  She had a hysterectomy back in 2003.  She denies any N/V/D.  She reports that her last BM was yesterday and was normal.  Patient is a 46 y.o. female presenting with dysuria. The history is provided by the patient.  Dysuria  Pertinent negatives include no chills, no nausea, no vomiting, no hematuria and no flank pain.    Past Medical History  Diagnosis Date  . PID (acute pelvic inflammatory disease)   . Chlamydia   . Depression   . PTSD (post-traumatic stress disorder)   . ADD (attention deficit disorder with hyperactivity)     Past Surgical History  Procedure Date  . Back surgery     dbl lumbar fusion L5-S1  . Bladder surgery   . Abdominal hysterectomy   . Laparoscopy     No family history on file.  History  Substance Use Topics  . Smoking status: Current Everyday Smoker -- 0.5 packs/day  . Smokeless tobacco: Not on file  . Alcohol Use: No    OB History    Grav Para Term Preterm Abortions TAB SAB Ect Mult Living                  Review of Systems  Constitutional: Negative for fever, chills and appetite change.  Gastrointestinal: Positive for abdominal pain. Negative for  nausea, vomiting, diarrhea, constipation, blood in stool and abdominal distention.  Genitourinary: Positive for dysuria. Negative for hematuria, flank pain, decreased urine volume, vaginal bleeding, vaginal discharge and vaginal pain.    Allergies  Imitrex; Clindamycin/lincomycin; Morphine and related; and Vicodin  Home Medications   Current Outpatient Rx  Name Route Sig Dispense Refill  . ACETAMINOPHEN 500 MG PO TABS Oral Take 1,000 mg by mouth every 6 (six) hours as needed. Pain    . ALPRAZOLAM 1 MG PO TABS Oral Take 1 mg by mouth 3 (three) times daily as needed. anxiety    . AMPHETAMINE-DEXTROAMPHETAMINE 20 MG PO TABS Oral Take 20 mg by mouth 3 (three) times daily as needed.      . BUPROPION HCL ER (XL) 300 MG PO TB24 Oral Take 300 mg by mouth daily.      . IBUPROFEN 200 MG PO TABS Oral Take 200 mg by mouth every 6 (six) hours as needed. pain     . SERTRALINE HCL 100 MG PO TABS Oral Take 200 mg by mouth daily.        BP 116/86  Pulse 68  Temp(Src) 97.6 F (36.4 C) (Oral)  Resp 18  SpO2 100%  Physical Exam  Nursing note and vitals reviewed. Constitutional: She is oriented to person, place, and time. She appears well-developed and well-nourished. No distress.  HENT:  Head: Normocephalic and atraumatic.  Cardiovascular: Normal rate, regular rhythm and normal heart sounds.   Pulmonary/Chest: Effort normal and breath sounds normal.  Abdominal: Soft. Bowel sounds are normal. She exhibits no distension and no mass. There is tenderness in the suprapubic area. There is no rigidity, no rebound, no guarding and no CVA tenderness.  Neurological: She is alert and oriented to person, place, and time.  Skin: Skin is warm and dry. She is not diaphoretic.  Psychiatric: Her mood appears anxious.    ED Course  Procedures (including critical care time)  Labs Reviewed  URINALYSIS, ROUTINE W REFLEX MICROSCOPIC - Abnormal; Notable for the following:    Hgb urine dipstick SMALL (*)    All  other components within normal limits  WET PREP, GENITAL - Abnormal; Notable for the following:    Clue Cells, Wet Prep FEW (*)    WBC, Wet Prep HPF POC RARE (*)    All other components within normal limits  URINE MICROSCOPIC-ADD ON  GC/CHLAMYDIA PROBE AMP, GENITAL   No results found.   No diagnosis found.  8:19 AM Reassessed patient.  She reports that her pain has improved.  MDM  Patient reports that her symptoms are similar to symptoms that she has had in the past with an interstitial cystitis flare up.  She is afebrile.  No N/V/D.  Urine did not show evidence of a UTI.  Feel that patient can be discharged home with pain medication and follow up with her urologist Dr. Logan Bores.        Pascal Lux [facility] 11/07/11 1606

## 2011-11-07 NOTE — ED Notes (Signed)
Pt states she has been taking Motrin and Tylenol for pain. Pt c/o lower abd pain and lower back pain. States her pain is worse "when my bladder is full". Pt denies burning upon urination.

## 2011-11-08 NOTE — ED Provider Notes (Signed)
Medical screening examination/treatment/procedure(s) were conducted as a shared visit with non-physician practitioner(s) and myself.  I personally evaluated the patient during the encounter  Pt c/o suprapubic pain, states same as with prior 'flares interstitial cystitis'. abd soft nt.   Suzi Roots, MD 11/08/11 [phone]

## 2011-11-09 LAB — GC/CHLAMYDIA PROBE AMP, GENITAL: GC Probe Amp, Genital: NEGATIVE

## 2012-02-24 ENCOUNTER — Emergency Department (HOSPITAL_COMMUNITY)
Admission: EM | Admit: 2012-02-24 | Discharge: 2012-02-24 | Disposition: A | Discharge status: Home / Self Care | Payer: Medicaid Other | Attending: Emergency Medicine | Admitting: Emergency Medicine

## 2012-02-24 ENCOUNTER — Encounter (HOSPITAL_COMMUNITY): Payer: Self-pay | Admitting: *Deleted

## 2012-02-24 ENCOUNTER — Emergency Department (HOSPITAL_COMMUNITY): Payer: Medicaid Other

## 2012-02-24 DIAGNOSIS — N3010 Interstitial cystitis (chronic) without hematuria: Secondary | ICD-10-CM

## 2012-02-24 DIAGNOSIS — Z79899 Other long term (current) drug therapy: Secondary | ICD-10-CM | POA: Insufficient documentation

## 2012-02-24 DIAGNOSIS — N83209 Unspecified ovarian cyst, unspecified side: Secondary | ICD-10-CM | POA: Insufficient documentation

## 2012-02-24 DIAGNOSIS — F988 Other specified behavioral and emotional disorders with onset usually occurring in childhood and adolescence: Secondary | ICD-10-CM | POA: Insufficient documentation

## 2012-02-24 DIAGNOSIS — R1084 Generalized abdominal pain: Secondary | ICD-10-CM | POA: Insufficient documentation

## 2012-02-24 DIAGNOSIS — F3289 Other specified depressive episodes: Secondary | ICD-10-CM | POA: Insufficient documentation

## 2012-02-24 DIAGNOSIS — F329 Major depressive disorder, single episode, unspecified: Secondary | ICD-10-CM | POA: Insufficient documentation

## 2012-02-24 DIAGNOSIS — R110 Nausea: Secondary | ICD-10-CM | POA: Insufficient documentation

## 2012-02-24 LAB — URINALYSIS, ROUTINE W REFLEX MICROSCOPIC
Bilirubin Urine: NEGATIVE
Glucose, UA: NEGATIVE mg/dL
Ketones, ur: NEGATIVE mg/dL
Leukocytes, UA: NEGATIVE
pH: 6 (ref 5.0–8.0)

## 2012-02-24 LAB — CBC
MCHC: 34.2 g/dL (ref 30.0–36.0)
RDW: 12.8 % (ref 11.5–15.5)
WBC: 8.7 10*3/uL (ref 4.0–10.5)

## 2012-02-24 LAB — DIFFERENTIAL
Basophils Absolute: 0.1 10*3/uL (ref 0.0–0.1)
Basophils Relative: 1 % (ref 0–1)
Lymphocytes Relative: 24 % (ref 12–46)
Neutro Abs: 5.7 10*3/uL (ref 1.7–7.7)
Neutrophils Relative %: 65 % (ref 43–77)

## 2012-02-24 LAB — COMPREHENSIVE METABOLIC PANEL
ALT: 6 U/L (ref 0–35)
AST: 16 U/L (ref 0–37)
Albumin: 3.3 g/dL — ABNORMAL LOW (ref 3.5–5.2)
Alkaline Phosphatase: 68 U/L (ref 39–117)
CO2: 24 mEq/L (ref 19–32)
Chloride: 103 mEq/L (ref 96–112)
Potassium: 3.4 mEq/L — ABNORMAL LOW (ref 3.5–5.1)
Sodium: 136 mEq/L (ref 135–145)
Total Bilirubin: 0.3 mg/dL (ref 0.3–1.2)

## 2012-02-24 MED ORDER — ONDANSETRON HCL 4 MG PO TABS: 4.0000 mg | ORAL_TABLET | Freq: Four times a day (QID) | ORAL | Status: AC

## 2012-02-24 MED ORDER — KETOROLAC TROMETHAMINE 30 MG/ML IJ SOLN
30.0000 mg | Freq: Once | INTRAMUSCULAR | Status: AC
  Administered 2012-02-24: 30 mg via INTRAVENOUS
  Filled 2012-02-24: qty 1

## 2012-02-24 MED ORDER — POTASSIUM CHLORIDE CRYS ER 20 MEQ PO TBCR
20.0000 meq | EXTENDED_RELEASE_TABLET | Freq: Once | ORAL | Status: AC
  Administered 2012-02-24: 20 meq via ORAL
  Filled 2012-02-24: qty 1

## 2012-02-24 MED ORDER — ONDANSETRON HCL 4 MG/2ML IJ SOLN
4.0000 mg | Freq: Once | INTRAMUSCULAR | Status: AC
  Administered 2012-02-24: 4 mg via INTRAVENOUS
  Filled 2012-02-24: qty 2

## 2012-02-24 MED ORDER — SODIUM CHLORIDE 0.9 % IV BOLUS (SEPSIS)
1000.0000 mL | Freq: Once | INTRAVENOUS | Status: AC
  Administered 2012-02-24: 1000 mL via INTRAVENOUS

## 2012-02-24 MED ORDER — HYDROMORPHONE HCL PF 1 MG/ML IJ SOLN
1.0000 mg | Freq: Once | INTRAMUSCULAR | Status: AC
  Administered 2012-02-24: 1 mg via INTRAVENOUS
  Filled 2012-02-24: qty 1

## 2012-02-24 MED ORDER — OXYCODONE-ACETAMINOPHEN 5-325 MG PO TABS: 1.0000 | ORAL_TABLET | Freq: Four times a day (QID) | ORAL | Status: AC | PRN

## 2012-02-24 MED ORDER — IOHEXOL 300 MG/ML  SOLN
100.0000 mL | Freq: Once | INTRAMUSCULAR | Status: AC | PRN
  Administered 2012-02-24: 100 mL via INTRAVENOUS

## 2012-02-24 NOTE — ED Notes (Signed)
Pt reports lower abd pain x 3 days-denies n/v/d.  Pt reports hx of IC.

## 2012-02-24 NOTE — ED Notes (Signed)
Pt states she is experiencing bilateral suprapubic pain x3 days. Was intermittent, became constant last night. Pain 8/10. Has been taking ibuprofen, relieved pain for first two days and did not relieve pain last night.

## 2012-02-24 NOTE — ED Notes (Signed)
Patient transported to CT 

## 2012-02-24 NOTE — ED Provider Notes (Signed)
History     CSN: [id number]  Arrival date & time 02/24/12  1749   First MD Initiated Contact with Patient 02/24/12 1904      Chief Complaint  Patient presents with  . Abdominal Pain    (Consider location/radiation/quality/duration/timing/severity/associated sxs/prior treatment) HPI Comments: S/p hyst  Patient is a 46 y.o. female presenting with abdominal pain. The history is provided by the patient. No language interpreter was used.  Abdominal Pain The primary symptoms of the illness include abdominal pain and nausea. The primary symptoms of the illness do not include fever, fatigue, shortness of breath, vomiting, diarrhea or dysuria. The current episode started 2 days ago. The onset of the illness was gradual. The problem has been gradually worsening.  The abdominal pain began 2 days ago. The pain came on gradually. The abdominal pain has been gradually worsening since its onset. The abdominal pain is generalized. The abdominal pain does not radiate. The abdominal pain is relieved by nothing. The abdominal pain is exacerbated by movement and urination.  The patient states that she believes she is currently not pregnant. Symptoms associated with the illness do not include chills, constipation, urgency, frequency or back pain.    Past Medical History  Diagnosis Date  . PID (acute pelvic inflammatory disease)   . Chlamydia   . Depression   . PTSD (post-traumatic stress disorder)   . ADD (attention deficit disorder with hyperactivity)     Past Surgical History  Procedure Date  . Back surgery     dbl lumbar fusion L5-S1  . Bladder surgery   . Abdominal hysterectomy   . Laparoscopy     No family history on file.  History  Substance Use Topics  . Smoking status: Current Everyday Smoker -- 0.5 packs/day  . Smokeless tobacco: Not on file  . Alcohol Use: No    OB History    Grav Para Term Preterm Abortions TAB SAB Ect Mult Living                  Review of Systems    Constitutional: Negative for fever, chills, activity change, appetite change and fatigue.  HENT: Negative for congestion, sore throat, rhinorrhea, neck pain and neck stiffness.   Respiratory: Negative for cough and shortness of breath.   Cardiovascular: Negative for chest pain and palpitations.  Gastrointestinal: Positive for nausea and abdominal pain. Negative for vomiting, diarrhea and constipation.  Genitourinary: Negative for dysuria, urgency, frequency and flank pain.  Musculoskeletal: Negative for myalgias, back pain and arthralgias.  Neurological: Negative for dizziness, weakness, light-headedness, numbness and headaches.  All other systems reviewed and are negative.    Allergies  Imitrex; Clindamycin/lincomycin; Morphine and related; and Vicodin  Home Medications   Current Outpatient Rx  Name Route Sig Dispense Refill  . ALPRAZOLAM 1 MG PO TABS Oral Take 1 mg by mouth 3 (three) times daily as needed. anxiety    . AMPHETAMINE-DEXTROAMPHETAMINE 20 MG PO TABS Oral Take 20 mg by mouth 3 (three) times daily as needed.      . IBUPROFEN 200 MG PO TABS Oral Take 200 mg by mouth every 6 (six) hours as needed. pain     . SERTRALINE HCL 100 MG PO TABS Oral Take 200 mg by mouth daily.      Marland Kitchen ONDANSETRON HCL 4 MG PO TABS Oral Take 1 tablet (4 mg total) by mouth every 6 (six) hours. 12 tablet 0  . OXYCODONE-ACETAMINOPHEN 5-325 MG PO TABS Oral Take 1-2 tablets by mouth  every 6 (six) hours as needed for pain. 12 tablet 0    BP 132/85  Pulse 105  Temp(Src) 98.5 F (36.9 C) (Oral)  Resp 18  SpO2 98%  Physical Exam  Nursing note and vitals reviewed. Constitutional: She is oriented to person, place, and time. She appears well-developed and well-nourished. No distress.  HENT:  Head: Normocephalic and atraumatic.  Mouth/Throat: Oropharynx is clear and moist.  Eyes: Conjunctivae and EOM are normal. Pupils are equal, round, and reactive to light.  Neck: Normal range of motion. Neck supple.   Cardiovascular: Normal rate, regular rhythm, normal heart sounds and intact distal pulses.  Exam reveals no gallop and no friction rub.   No murmur heard. Pulmonary/Chest: Effort normal and breath sounds normal. No respiratory distress. She exhibits no tenderness.  Abdominal: Soft. Bowel sounds are normal. There is tenderness. There is guarding. There is no rebound.  Musculoskeletal: Normal range of motion. She exhibits no edema and no tenderness.  Neurological: She is alert and oriented to person, place, and time. No cranial nerve deficit.  Skin: Skin is warm and dry. No rash noted.    ED Course  Procedures (including critical care time)  Labs Reviewed  COMPREHENSIVE METABOLIC PANEL - Abnormal; Notable for the following:    Potassium 3.4 (*)    Albumin 3.3 (*)    All other components within normal limits  URINALYSIS, ROUTINE W REFLEX MICROSCOPIC - Abnormal; Notable for the following:    Hgb urine dipstick SMALL (*)    All other components within normal limits  CBC  DIFFERENTIAL  LIPASE, BLOOD  URINE MICROSCOPIC-ADD ON   Ct Abdomen Pelvis W Contrast  02/24/2012  *RADIOLOGY REPORT*  Clinical Data: Bilateral pelvic pain, evaluate for appendicitis, history of urinary bladder lift and vaginal mesh removal  CT ABDOMEN AND PELVIS WITH CONTRAST  Technique:  Multidetector CT imaging of the abdomen and pelvis was performed following the standard protocol during bolus administration of intravenous contrast.  Contrast: OMNIPAQUE IOHEXOL 300 MG/ML  SOLN  Comparison: CT abdomen pelvis - 12/17/2009;Pelvic ultrasound - 06/27/2010  Findings:  Normal hepatic contour.  No discrete hepatic lesions.  Normal gallbladder.  No intra or extrahepatic biliary ductal dilatation. No ascites.  There is symmetric enhancement and excretion of the bilateral kidneys.  No discrete renal lesions.  The no renal stones.  No urinary obstruction.  No perinephric stranding.  The bilateral adrenal glands, pancreas and  spleen are normal.  Incidental note is made of a small splenule.  Scattered colonic diverticulosis without evidence of diverticulitis.  Ingested enteric contrast extends to the level of the splenic flexure of the colon.  No evidence of tear obstruction. Normal appearance of the appendix.  No pneumoperitoneum, pneumatosis or portal venous gas.  Normal caliber abdominal aorta.  The major branch vessels of the abdominal aorta, including the IMA, are patent.  No retroperitoneal, mesenteric or pelvic lymphadenopathy.  There is a septated cystic right adnexal structure was dominant cystic component measures approximately 3.1 x 2.6 cm likely representing a benign cyst.  No discrete left-sided adnexal lesion. No free fluid in the pelvis.  Limited visualization of the lower thorax is negative for focal airspace opacity or pleural effusion.  Normal heart size.  No pericardial effusion.  No acute or aggressive osseous abnormalities.  Post L4 - S1 paraspinal fusion without evidence of hardware failure or loosening.  Incomplete bony fusion of L5 - S1. Bone graft donor site from the left ilium.  IMPRESSION: 1.  No acute  findings in the abdomen or pelvis.  Specifically, no evidence of enteric or urinary obstruction.  Normal appearance of the appendix. 2.  Right-sided ovarian cyst measuring approximately 3.1 cm in diameter.  This is almost certainly benign, and no specific imaging follow up is recommended according to the Society of Radiologists in Ultrasound 2010 Consensus  Conference Statement (D Lenis Noon et al. Management of Asymptomatic Ovarian and Other Adnexal Cysts Imaged at [place]:  Society of Radiologists in Ultrasound Consensus Conference Statement 2010.  Radiology 256 (Sept 2010): 943-954.).  3.  Colonic diverticulosis without evidence of diverticulitis.  Original Report Authenticated By: Waynard Reeds, M.D.     1. Ovarian cyst   2. Abdominal pain   3. Interstitial cystitis       MDM  Pain likely secondary to  interstitial cystitis and an ovarian cyst. I have no concern about ovarian torsion. She received pain medication, antiemetics, IV fluids. She improvement of her symptoms. Remainder of her workup was relatively unremarkable. She has mild hypokalemia which was replaced. She'll be discharged home with instructions to followup with primary care physician in one week. She is provided a prescription for Percocet and Zofran.        Dayton Bailiff, MD 02/24/12 2303

## 2012-02-24 NOTE — ED Notes (Signed)
Pt denies n/v/d. Per pt: Hx of ovarian cysts, Hysterectomy 2003 - uterus only, UTI in December 2012.

## 2012-02-24 NOTE — Discharge Instructions (Signed)

## 2012-04-21 ENCOUNTER — Emergency Department (HOSPITAL_COMMUNITY)
Admission: EM | Admit: 2012-04-21 | Discharge: 2012-04-21 | Disposition: A | Discharge status: Home / Self Care | Payer: Self-pay | Attending: Emergency Medicine | Admitting: Emergency Medicine

## 2012-04-21 ENCOUNTER — Encounter (HOSPITAL_COMMUNITY): Payer: Self-pay | Admitting: Emergency Medicine

## 2012-04-21 ENCOUNTER — Emergency Department (HOSPITAL_COMMUNITY): Payer: Self-pay

## 2012-04-21 DIAGNOSIS — F17200 Nicotine dependence, unspecified, uncomplicated: Secondary | ICD-10-CM | POA: Insufficient documentation

## 2012-04-21 DIAGNOSIS — F988 Other specified behavioral and emotional disorders with onset usually occurring in childhood and adolescence: Secondary | ICD-10-CM | POA: Insufficient documentation

## 2012-04-21 DIAGNOSIS — F329 Major depressive disorder, single episode, unspecified: Secondary | ICD-10-CM | POA: Insufficient documentation

## 2012-04-21 DIAGNOSIS — F3289 Other specified depressive episodes: Secondary | ICD-10-CM | POA: Insufficient documentation

## 2012-04-21 DIAGNOSIS — R102 Pelvic and perineal pain: Secondary | ICD-10-CM

## 2012-04-21 DIAGNOSIS — N9489 Other specified conditions associated with female genital organs and menstrual cycle: Secondary | ICD-10-CM | POA: Insufficient documentation

## 2012-04-21 DIAGNOSIS — R1032 Left lower quadrant pain: Secondary | ICD-10-CM | POA: Insufficient documentation

## 2012-04-21 HISTORY — DX: Unspecified ovarian cyst, right side: N83.202

## 2012-04-21 HISTORY — DX: Unspecified ovarian cyst, right side: N83.201

## 2012-04-21 LAB — CBC WITH DIFFERENTIAL/PLATELET
Basophils Absolute: 0.1 10*3/uL (ref 0.0–0.1)
Basophils Relative: 1 % (ref 0–1)
Eosinophils Absolute: 0.1 10*3/uL (ref 0.0–0.7)
Eosinophils Relative: 1 % (ref 0–5)
HCT: 42.6 % (ref 36.0–46.0)
Hemoglobin: 14.9 g/dL (ref 12.0–15.0)
Lymphocytes Relative: 29 % (ref 12–46)
Lymphs Abs: 3 10*3/uL (ref 0.7–4.0)
MCH: 31.5 pg (ref 26.0–34.0)
MCHC: 35 g/dL (ref 30.0–36.0)
MCV: 90.1 fL (ref 78.0–100.0)
Monocytes Absolute: 0.7 10*3/uL (ref 0.1–1.0)
Monocytes Relative: 6 % (ref 3–12)
Neutro Abs: 6.7 10*3/uL (ref 1.7–7.7)
Neutrophils Relative %: 63 % (ref 43–77)
Platelets: 348 10*3/uL (ref 150–400)
RBC: 4.73 MIL/uL (ref 3.87–5.11)
RDW: 13 % (ref 11.5–15.5)
WBC: 10.5 10*3/uL (ref 4.0–10.5)

## 2012-04-21 LAB — BASIC METABOLIC PANEL
BUN: 9 mg/dL (ref 6–23)
CO2: 26 mEq/L (ref 19–32)
Calcium: 9.2 mg/dL (ref 8.4–10.5)
Chloride: 98 mEq/L (ref 96–112)
Creatinine, Ser: 0.67 mg/dL (ref 0.50–1.10)
GFR calc Af Amer: >90 mL/min (ref >90)
GFR calc non Af Amer: >90 mL/min (ref >90)
Glucose, Bld: 89 mg/dL (ref 70–99)
Potassium: 4.3 mEq/L (ref 3.5–5.1)
Sodium: 133 mEq/L — ABNORMAL LOW (ref 135–145)

## 2012-04-21 LAB — WET PREP, GENITAL
Trich, Wet Prep: NONE SEEN
Yeast Wet Prep HPF POC: NONE SEEN

## 2012-04-21 LAB — URINALYSIS, ROUTINE W REFLEX MICROSCOPIC
Bilirubin Urine: NEGATIVE
Glucose, UA: NEGATIVE mg/dL
Ketones, ur: NEGATIVE mg/dL
Leukocytes, UA: NEGATIVE
Nitrite: NEGATIVE
Protein, ur: NEGATIVE mg/dL
Specific Gravity, Urine: 1.009 (ref 1.005–1.030)
Urobilinogen, UA: 0.2 mg/dL (ref 0.0–1.0)
pH: 6 (ref 5.0–8.0)

## 2012-04-21 LAB — POCT PREGNANCY, URINE: Preg Test, Ur: NEGATIVE

## 2012-04-21 MED ORDER — OXYCODONE-ACETAMINOPHEN 5-325 MG PO TABS: 1.0000 | ORAL_TABLET | Freq: Four times a day (QID) | ORAL | Status: AC | PRN

## 2012-04-21 MED ORDER — FENTANYL CITRATE 0.05 MG/ML IJ SOLN
50.0000 ug | Freq: Once | INTRAMUSCULAR | Status: AC
  Administered 2012-04-21: 50 ug via INTRAMUSCULAR
  Filled 2012-04-21: qty 2

## 2012-04-21 MED ORDER — FENTANYL CITRATE 0.05 MG/ML IJ SOLN
50.0000 ug | Freq: Once | INTRAMUSCULAR | Status: AC
  Administered 2012-04-21: 50 ug via INTRAVENOUS
  Filled 2012-04-21: qty 2

## 2012-04-21 MED ORDER — OXYCODONE-ACETAMINOPHEN 5-325 MG PO TABS
2.0000 | ORAL_TABLET | Freq: Once | ORAL | Status: AC
  Administered 2012-04-21: 2 via ORAL
  Filled 2012-04-21: qty 2

## 2012-04-21 MED ORDER — ONDANSETRON 4 MG PO TBDP
4.0000 mg | ORAL_TABLET | Freq: Once | ORAL | Status: AC
  Administered 2012-04-21: 4 mg via ORAL
  Filled 2012-04-21: qty 1

## 2012-04-21 NOTE — Discharge Instructions (Signed)
You were seen and evaluated today for your complaints of lower pelvic pain. Your blood tests, urine tests and ultrasound have not shown any concerning findings to explain your symptoms. Your providers today discussed options for further testing including considering a CAT scan to evaluate for other causes of her symptoms at this time he he feels ready to return home and try symptomatic treatment. Is recommended that he have a recheck of your symptoms within the next 48 hours. You should also return sooner if you develop any severe pain, persistent nausea vomiting, fever chills or sweats.    Pelvic Pain Pelvic pain is pain below the belly button and located between your hips. Acute pain may last a few hours or days. Chronic pelvic pain may last weeks and months. The cause may be different for different types of pain. The pain may be dull or sharp, mild or severe and can interfere with your daily activities. Write down and tell your caregiver:   Exactly where the pain is located.   If it comes and goes or is there all the time.   When it happens (with sex, urination, bowel movement, etc.)   If the pain is related to your menstrual period or stress.  Your caregiver will take a full history and do a complete physical exam and Pap test. CAUSES   Painful menstrual periods (dysmenorrhea).   Normal ovulation (Mittelschmertz) that occurs in the middle of the menstrual cycle every month.   The pelvic organs get engorged with blood just before the menstrual period (pelvic congestive syndrome).   Scar tissue from an infection or past surgery (pelvic adhesions).   Cancer of the female pelvic organs. When there is pain with cancer, it has been there for a long time.   The lining of the uterus (endometrium) abnormally grows in places like the pelvis and on the pelvic organs (endometriosis).   A form of endometriosis with the lining of the uterus present inside of the muscle tissue of the uterus  (adenomyosis).   Fibroid tumor (noncancerous) in the uterus.   Bladder problems such as infection, bladder spasms of the muscle tissue of the bladder.   Intestinal problems (irritable bowel syndrome, colitis, an ulcer or gastrointestinal infection).   Polyps of the cervix or uterus.   Pregnancy in the tube (ectopic pregnancy).   The opening of the cervix is too small for the menstrual blood to flow through it (cervical stenosis).   Physical or sexual abuse (past or present).   Musculo-skeletal problems from poor posture, problems with the vertebrae of the lower back or the uterine pelvic muscles falling (prolapse).   Psychological problems such as depression or stress.   IUD (intrauterine device) in the uterus.  DIAGNOSIS  Tests to make a diagnosis depends on the type, location, severity and what causes the pain to occur. Tests that may be needed include:  Blood tests.   Urine tests   Ultrasound.   X-rays.   CT Scan.   MRI.   Laparoscopy.   Major surgery.  TREATMENT  Treatment will depend on the cause of the pain, which includes:  Prescription or over-the-counter pain medication.   Antibiotics.   Birth control pills.   Hormone treatment.   Nerve blocking injections.   Physical therapy.   Antidepressants.   Counseling with a psychiatrist or psychologist.   Minor or major surgery.  HOME CARE INSTRUCTIONS   Only take over-the-counter or prescription medicines for pain, discomfort or fever as directed by your  caregiver.   Follow your caregiver's advice to treat your pain.   Rest.   Avoid sexual intercourse if it causes the pain.   Apply warm or cold compresses (which ever works best) to the pain area.   Do relaxation exercises such as yoga or meditation.   Try acupuncture.   Avoid stressful situations.   Try group therapy.   If the pain is because of a stomach/intestinal upset, drink clear liquids, eat a bland light food diet until the  symptoms go away.  SEEK MEDICAL CARE IF:   You need stronger prescription pain medication.   You develop pain with sexual intercourse.   You have pain with urination.   You develop a temperature of 102 F (38.9 C) with the pain.   You are still in pain after 4 hours of taking prescription medication for the pain.   You need depression medication.   Your IUD is causing pain and you want it removed.  SEEK IMMEDIATE MEDICAL CARE IF:  You develop very severe pain or tenderness.   You faint, have chills, severe weakness or dehydration.   You develop heavy vaginal bleeding or passing solid tissue.   You develop a temperature of 102 F (38.9 C) with the pain.   You have blood in the urine.   You are being physically or sexually abused.   You have uncontrolled vomiting and diarrhea.   You are depressed and afraid of harming yourself or someone else.  Document Released: 11/19/2004 Document Revised: 10/01/2011 Document Reviewed: 08/16/2008 [facility] Patient Information 2012 [place], [place].     RESOURCE GUIDE  Chronic Pain Problems: Contact [facility]  [phone] Patients need to be referred by their primary care doctor.  Insufficient Money for Medicine: Contact United Way:  call "211" or [facility] [phone].  No Primary Care Doctor: - Call Health Connect  [phone] - can help you locate a primary care doctor that  accepts your insurance, provides certain services, etc. - Physician Referral [phone]  Agencies that provide inexpensive medical care: - [facility]  [phone] - [facility]  [phone] - [facility]  [phone] [facility]  [phone] - Planned Parenthood  [phone] [facility]  [phone]  Medicaid-accepting [facility] Providers: - [facility]- [address]  [phone], Mon-Fri 9am-7pm, Sat 9am-1pm - [facility]- [address] [place], [address] [place]  [phone] - [facility]- [address] [place]  [phone] [facility]- [address]  [phone] - Renaye Rakers- [address] [place], [address], [phone]  Only accepts [place] patients after they have their name  applied to their card  Self Pay (no insurance) in [place]: - Sickle Cell Patients: Dr Willey Blade, [facility]  [address] [place], [phone] - [facility]- [address] [place]  [phone]       [facility] [place]- [address]       -     [facility]- see information above (Speak to [employer] if you do not have insurance)       -  [facility]- [address] [place], [phone]       -  [facility]- [address],  [phone]       -  [facility]- [address] [phone]       -  Dr Julio Sicks-  [address], [address], [place], [phone]       -  [facility]- [address], [phone]       -  [facility]- [address] [phone], also [address], [phone]       -    [facility]- [address] [place], [phone], 1st & 3rd Saturday   every month, 10am-1pm  1) Find a Doctor and Pay Out of Pocket Although you won't have to find out who is covered by your insurance plan, it is a good idea to ask around and get recommendations. You will then need to call the office and see if the doctor you have chosen will accept you as a new patient and what types of options they offer for patients who are self-pay. Some doctors offer discounts or will set up payment plans for their patients who do not have insurance, but you will need to ask so you aren't surprised when you get to your appointment.  2) Contact Your Local Health Department Not all health departments have doctors that can see patients for sick visits, but many  do, so it is worth a call to see if yours does. If you don't know where your local health department is, you can check in your phone book. The CDC also has a tool to help you locate your state's health department, and many state websites also have listings of all of their local health departments.  3) Find a Walk-in Clinic If your illness is not likely to be very severe or complicated, you may want to try a walk in clinic. These are popping up all over the country in pharmacies, drugstores, and shopping centers. They're usually staffed by nurse practitioners or physician assistants that have been trained to treat common illnesses and complaints. They're usually fairly quick and inexpensive. However, if you have serious medical issues or chronic medical problems, these are probably not your best option  STD Testing - [facility] of [facility], STD Clinic, [address] [place], phone [phone] or [phone].  Monday - Friday, call for an appointment. [facility] of [employer], STD Clinic, [address], [place], phone [phone] or [phone].  Monday - Friday, call for an appointment.  Abuse/Neglect: [facility] Child Abuse Hotline (316)108-5043 [facility] Child Abuse Hotline [phone] (After Hours)  Emergency Shelter:  [facility] 2200187831  Maternity Homes: - [facility] (714)[phone] - [facility] 5594752259  MRSA Hotline #:   3102467238  [facility] Resources  [facility] [place]  United Way [facility] Dept. [address]                 [address]         [address] [place] [address]  [address]  [place] Phone:  [phone]                                  Phone:  [phone]                   Phone:  [phone]  [facility], [phone] - [phone]       -     [facility] in [place], [address]                                  [phone], [facility] Child Abuse Hotline (236)681-1385 or 7048768554 (After Hours)   Behavioral Health Services  Substance Abuse Resources: - [facility] Drug [facility]  ([phone] - [facility] ([phone] - [facility] [phone] Floydene Flock [phone] - Residential & Outpatient Substance Abuse Program  [phone]  Psychological Services: [facility]  [phone] [facility]  [phone] - [facility], [phone] [place]. [address], [place], ACCESS LINE: [phone] or ([phone], [employer]  Dental Assistance  If unable to pay or uninsured, contact:  [facility] or [facility] to become qualified for the adult dental clinic.  Patients with Medicaid: [facility] [phone] [address], [phone] [address] [phone]  If unable to pay, or uninsured, contact [facility] (780) [phone]) or [facility] Department [phone] in [place], [phone] in [facility]) to become qualified for the adult dental clinic  Other Low-Cost Community Dental Services: - [facility]- [address] [place], [place], [place], [address], [phone], Ext. [phone], 2nd and 4th Thursday of the month at 6:30am.  10 clients each day by appointment, can sometimes see walk-in patients if someone does not show for an appointment. [facility]- [address] [place], [place], [address], [phone] - [facility]- [address], [place], [place], [address], [phone] - [facility]- [phone] [facility] Health Department- [phone] [facility]  Department- [phone]

## 2012-04-21 NOTE — ED Notes (Signed)
Per pt states she was awoken from sleep with left pelvic pain, later she experience some urinary incontinence

## 2012-04-21 NOTE — Progress Notes (Signed)
Pt confirmed she is a self pay [place] resident with no pcp CM discussed and provided written information for self pay [place] pcps and medication resources Pt voiced understanding and appreciation of services and resources offered

## 2012-04-21 NOTE — ED Notes (Signed)
Pt reports LLQ pain in abdomen that began around 3:00 am. Sharp, constant pain. Reports having intercourse yesterday. Reports regular BM, last BM this morning. Reports urinary urgency/frequency x1 day. Denies pain with voiding.

## 2012-04-21 NOTE — ED Notes (Signed)
Patient transported to Ultrasound 

## 2012-04-21 NOTE — ED Provider Notes (Signed)
[name] 8:00 PM patient discussed in sign out with Drucie Opitz PAC. Patient presenting with lower pelvic pains. Patient has history of partial hysterectomy, past bladder sling procedures. Pain has been present for one day. No other associated symptoms. Labs and UA unremarkable. Will ultrasound of pelvis with pending.  Ultrasound unremarkable. Patient reassessed with soft abdomen and lower pelvic tenderness. No peritoneal signs. Discussed findings with patient as well as options for further evaluation. We discussed possible options CAT scan to rule out any other emergent causes of pain. At this time patient does not wish to have a CAT scan would prefer to return home. We discussed treatment plans with Percocet as well as a 48 hour recheck of symptoms. She agrees to this. She was also given strict term precautions.    Angus Seller, [place] 04/21/12 2140

## 2012-04-21 NOTE — ED Provider Notes (Signed)
History     CSN: [id number]  Arrival date & time 04/21/12  1636   First MD Initiated Contact with Patient 04/21/12 1806      Chief Complaint  Patient presents with  . Pelvic Pain    (Consider location/radiation/quality/duration/timing/severity/associated sxs/prior treatment) HPI  Patient who is s/p partial hysterectomy presents to ER complaining of a 24 hour hx of gradual onset LLQ and suprapubic pain that she states began as a dull ache yesterday but states she woke with increased pain in left pelvic region at 3 am with persistent pain throughout the day today despite take aleve. Patient also notes increased urinary frequency stating "I feel like I have to go to the bathroom every few minutes but then I only get a little bit out and I'm dribbling into my panties" noticing some urinary incontinence but states this is not unusual for her since having a bladder tack. Denies dysuria, hematuria, n/v/d, vaginal d/c, flank pain, fevers or chills. Denies aggravating or alleviating factors.   Past Medical History  Diagnosis Date  . PID (acute pelvic inflammatory disease)   . Chlamydia   . Depression   . PTSD (post-traumatic stress disorder)   . ADD (attention deficit disorder with hyperactivity)   . Bilateral ovarian cysts     Past Surgical History  Procedure Date  . Back surgery     dbl lumbar fusion L5-S1  . Bladder surgery   . Abdominal hysterectomy   . Laparoscopy     No family history on file.  History  Substance Use Topics  . Smoking status: Current Everyday Smoker -- 0.5 packs/day  . Smokeless tobacco: Not on file  . Alcohol Use: No    OB History    Grav Para Term Preterm Abortions TAB SAB Ect Mult Living                  Review of Systems  All other systems reviewed and are negative.    Allergies  Imitrex; Clindamycin/lincomycin; Morphine and related; and Vicodin  Home Medications   Current Outpatient Rx  Name Route Sig Dispense Refill  . ALPRAZOLAM 1  MG PO TABS Oral Take 1 mg by mouth 3 (three) times daily as needed. anxiety    . AMPHETAMINE-DEXTROAMPHETAMINE 20 MG PO TABS Oral Take 20 mg by mouth 3 (three) times daily as needed. For alertness.    Marland Kitchen NAPROXEN SODIUM 220 MG PO TABS Oral Take 440 mg by mouth 2 (two) times daily as needed. For pain.    Marland Kitchen SERTRALINE HCL 100 MG PO TABS Oral Take 200 mg by mouth daily.        BP 131/83  Pulse 75  Temp 98.5 F (36.9 C)  Resp 18  Ht 5\' 6"  (1.676 m)  Wt 205 lb (92.987 kg)  BMI 33.09 kg/m2  SpO2 97%  Physical Exam  Nursing note and vitals reviewed. Constitutional: She is oriented to person, place, and time. She appears well-developed and well-nourished. No distress.  HENT:  Head: Normocephalic and atraumatic.  Eyes: Conjunctivae are normal.  Neck: Normal range of motion. Neck supple.  Cardiovascular: Normal rate, regular rhythm, normal heart sounds and intact distal pulses.  Exam reveals no gallop and no friction rub.   No murmur heard. Pulmonary/Chest: Effort normal and breath sounds normal. No respiratory distress. She has no wheezes. She has no rales. She exhibits no tenderness.  Abdominal: Soft. Bowel sounds are normal. She exhibits no distension and no mass. There is tenderness. There is no  rebound and no guarding.       TTP of suprapubic region and LLQ no guarding or rigidity.   Genitourinary: No vaginal discharge found.       Surgically absent cervix but TTP of left adnexal region and palpating up towards bladder but no bladder prolapse.   Musculoskeletal: Normal range of motion. She exhibits no edema and no tenderness.  Neurological: She is alert and oriented to person, place, and time.  Skin: Skin is warm and dry. No rash noted. She is not diaphoretic. No erythema.  Psychiatric: She has a normal mood and affect.    ED Course  Procedures (including critical care time)  IM fentanyl  Labs Reviewed  BASIC METABOLIC PANEL - Abnormal; Notable for the following:    Sodium 133  (*)     All other components within normal limits  URINALYSIS, ROUTINE W REFLEX MICROSCOPIC - Abnormal; Notable for the following:    Hgb urine dipstick TRACE (*)     All other components within normal limits  WET PREP, GENITAL - Abnormal; Notable for the following:    Clue Cells Wet Prep HPF POC RARE (*)     WBC, Wet Prep HPF POC RARE (*)     All other components within normal limits  CBC WITH DIFFERENTIAL  URINE MICROSCOPIC-ADD ON  GC/CHLAMYDIA PROBE AMP, GENITAL   No results found.   No diagnosis found.    MDM  [place] pending to rule out ovarian cyst vs ovarian torsion vs TOA. Ivonne Andrew to follow with dispo pending [place] result. Non acute abdomen. Patient afebrile and non toxic appearing.         [place], [place] 04/21/12 2040

## 2012-04-22 LAB — GC/CHLAMYDIA PROBE AMP, GENITAL: GC Probe Amp, Genital: NEGATIVE

## 2012-04-28 NOTE — ED Provider Notes (Signed)
Medical screening examination/treatment/procedure(s) were performed by non-physician practitioner and as supervising physician I was immediately available for consultation/collaboration.  Gal Feldhaus, MD 04/28/12 2045 

## 2012-04-28 NOTE — ED Provider Notes (Signed)
Medical screening examination/treatment/procedure(s) were performed by non-physician practitioner and as supervising physician I was immediately available for consultation/collaboration.  Raeford Razor, MD 04/28/12 2046

## 2012-06-17 ENCOUNTER — Emergency Department (HOSPITAL_COMMUNITY)
Admission: EM | Admit: 2012-06-17 | Discharge: 2012-06-17 | Disposition: A | Discharge status: Home / Self Care | Payer: Self-pay | Attending: Emergency Medicine | Admitting: Emergency Medicine

## 2012-06-17 ENCOUNTER — Encounter (HOSPITAL_COMMUNITY): Payer: Self-pay | Admitting: *Deleted

## 2012-06-17 DIAGNOSIS — N390 Urinary tract infection, site not specified: Secondary | ICD-10-CM | POA: Insufficient documentation

## 2012-06-17 LAB — CBC
HCT: 39.3 % (ref 36.0–46.0)
MCH: 31.4 pg (ref 26.0–34.0)
MCV: 90.8 fL (ref 78.0–100.0)
RDW: 13.1 % (ref 11.5–15.5)
WBC: 12.7 10*3/uL — ABNORMAL HIGH (ref 4.0–10.5)

## 2012-06-17 LAB — URINALYSIS, ROUTINE W REFLEX MICROSCOPIC
Bilirubin Urine: NEGATIVE
Glucose, UA: NEGATIVE mg/dL
Protein, ur: NEGATIVE mg/dL

## 2012-06-17 LAB — COMPREHENSIVE METABOLIC PANEL
Albumin: 3.7 g/dL (ref 3.5–5.2)
BUN: 11 mg/dL (ref 6–23)
CO2: 25 mEq/L (ref 19–32)
Chloride: 100 mEq/L (ref 96–112)
Creatinine, Ser: 0.85 mg/dL (ref 0.50–1.10)
GFR calc non Af Amer: 83 mL/min — ABNORMAL LOW (ref >90)
Total Bilirubin: 0.2 mg/dL — ABNORMAL LOW (ref 0.3–1.2)

## 2012-06-17 LAB — URINE MICROSCOPIC-ADD ON

## 2012-06-17 MED ORDER — OXYCODONE-ACETAMINOPHEN 5-325 MG PO TABS: 1.0000 | ORAL_TABLET | ORAL | Status: AC | PRN

## 2012-06-17 MED ORDER — HYDROCODONE-ACETAMINOPHEN 5-325 MG PO TABS: 2.0000 | ORAL_TABLET | ORAL | Status: DC | PRN

## 2012-06-17 MED ORDER — SULFAMETHOXAZOLE-TRIMETHOPRIM 800-160 MG PO TABS: 1.0000 | ORAL_TABLET | Freq: Two times a day (BID) | ORAL | Status: AC

## 2012-06-17 MED ORDER — OXYCODONE-ACETAMINOPHEN 5-325 MG PO TABS
1.0000 | ORAL_TABLET | Freq: Once | ORAL | Status: AC
  Administered 2012-06-17: 1 via ORAL
  Filled 2012-06-17: qty 1

## 2012-06-17 NOTE — ED Notes (Signed)
Spoke to Dr. Fonnie Jarvis, Dr. Fonnie Jarvis will see in Triage, pt moved to Tr8.

## 2012-06-17 NOTE — ED Provider Notes (Signed)
Medical screening examination/treatment/procedure(s) were performed by non-physician practitioner and as supervising physician I was immediately available for consultation/collaboration.   Ashle Stief M Shiron Whetsel, MD 06/17/12 2158 

## 2012-06-17 NOTE — ED Notes (Signed)
Pt reports lower abd pain, hematuria and urinary frequency x 2 days.  Denies dysuria.

## 2012-06-17 NOTE — ED Provider Notes (Signed)
History     CSN: [id number]  Arrival date & time 06/17/12  1026   First MD Initiated Contact with Patient 06/17/12 1211      Chief Complaint  Patient presents with  . Abdominal Pain  . Hematuria  . Urinary Frequency    (Consider location/radiation/quality/duration/timing/severity/associated sxs/prior treatment) Patient is a 46 y.o. female presenting with abdominal pain, hematuria, and frequency. The history is provided by the patient and medical records.  Abdominal Pain The primary symptoms of the illness include abdominal pain. The primary symptoms of the illness do not include fever, fatigue, shortness of breath, nausea, vomiting, diarrhea, dysuria, vaginal discharge or vaginal bleeding.  Additional symptoms associated with the illness include urgency, hematuria and frequency. Symptoms associated with the illness do not include chills or back pain.  Hematuria Irritative symptoms include frequency and urgency. Associated symptoms include abdominal pain. Pertinent negatives include no chills, dysuria, fever, flank pain, nausea or vomiting.  Urinary Frequency Associated symptoms include abdominal pain. Pertinent negatives include no chest pain, chills, fatigue, fever, nausea, rash or vomiting.   [name] is a 46 y.o. female presents to the emergency department complaining of abdominal pain.  The onset of the symptoms was  gradual starting 2 days ago.  The patient has associated frequency, urgency, hematuria, headache.  The symptoms have been  persistent, gradually worsened. nothing makes the symptoms worse and ibuprofen makes symptoms better.  The patient denies fever, chills, headache, back pain, flank pain.  Pt is a sexually active female.  Hx of UTI many years ago.   Patient is sexually active. Denies vaginal bleeding, vaginal discharge, vaginal itching.  History of hysterectomy.  The patient has medical history significant for:  Past Medical History  Diagnosis Date  . PID  (acute pelvic inflammatory disease)   . Chlamydia   . Depression   . PTSD (post-traumatic stress disorder)   . ADD (attention deficit disorder with hyperactivity)   . Bilateral ovarian cysts      Past Medical History  Diagnosis Date  . PID (acute pelvic inflammatory disease)   . Chlamydia   . Depression   . PTSD (post-traumatic stress disorder)   . ADD (attention deficit disorder with hyperactivity)   . Bilateral ovarian cysts     Past Surgical History  Procedure Date  . Back surgery     dbl lumbar fusion L5-S1  . Bladder surgery   . Abdominal hysterectomy   . Laparoscopy     No family history on file.  History  Substance Use Topics  . Smoking status: Current Everyday Smoker -- 0.5 packs/day  . Smokeless tobacco: Not on file  . Alcohol Use: No    OB History    Grav Para Term Preterm Abortions TAB SAB Ect Mult Living                  Review of Systems  Constitutional: Negative for fever, chills and fatigue.  Respiratory: Negative for shortness of breath.   Cardiovascular: Negative for chest pain.  Gastrointestinal: Positive for abdominal pain. Negative for nausea, vomiting and diarrhea.  Genitourinary: Positive for urgency, frequency, hematuria and pelvic pain. Negative for dysuria, flank pain, decreased urine volume, vaginal bleeding, vaginal discharge, difficulty urinating, vaginal pain and dyspareunia.  Musculoskeletal: Negative for back pain.  Skin: Negative for rash.    Allergies  Imitrex; Clindamycin/lincomycin; Morphine and related; and Vicodin  Home Medications   Current Outpatient Rx  Name Route Sig Dispense Refill  . ALPRAZOLAM 1 MG PO  TABS Oral Take 1 mg by mouth 3 (three) times daily as needed. anxiety    . AMPHETAMINE-DEXTROAMPHETAMINE 20 MG PO TABS Oral Take 20 mg by mouth 3 (three) times daily as needed. For alertness.    . ASPIRIN 325 MG PO TABS Oral Take 975 mg by mouth once.    . BUPROPION HCL ER (XL) 300 MG PO TB24 Oral Take 300 mg by  mouth daily.    . SERTRALINE HCL 100 MG PO TABS Oral Take 200 mg by mouth daily.      . OXYCODONE-ACETAMINOPHEN 5-325 MG PO TABS Oral Take 1 tablet by mouth every 4 (four) hours as needed for pain. 6 tablet 0  . SULFAMETHOXAZOLE-TRIMETHOPRIM 800-160 MG PO TABS Oral Take 1 tablet by mouth every 12 (twelve) hours. 20 tablet 0    BP 126/58  Pulse 66  Temp 98.3 F (36.8 C) (Oral)  Resp 20  SpO2 99%  Physical Exam  Nursing note and vitals reviewed. Constitutional: She appears well-developed and well-nourished.  HENT:  Head: Normocephalic and atraumatic.  Mouth/Throat: Oropharynx is clear and moist.  Eyes: Conjunctivae are normal. No scleral icterus.  Cardiovascular: Normal rate, regular rhythm, normal heart sounds and intact distal pulses.  Exam reveals no gallop and no friction rub.   No murmur heard. Pulmonary/Chest: Effort normal and breath sounds normal. No respiratory distress. She has no wheezes.  Abdominal: Soft. Normal appearance and bowel sounds are normal. She exhibits no distension and no mass. There is tenderness (worst in the suprapubic area) in the right lower quadrant, suprapubic area and left lower quadrant. There is guarding. There is no rebound, no CVA tenderness, no tenderness at McBurney's point and negative Murphy's sign.  Musculoskeletal: Normal range of motion.  Neurological: She is alert.  Skin: Skin is warm and dry. No rash noted.  Psychiatric: She has a normal mood and affect.    ED Course  Procedures (including critical care time)  Labs Reviewed  URINALYSIS, ROUTINE W REFLEX MICROSCOPIC - Abnormal; Notable for the following:    APPearance CLOUDY (*)     Hgb urine dipstick LARGE (*)     Leukocytes, UA LARGE (*)     All other components within normal limits  CBC - Abnormal; Notable for the following:    WBC 12.7 (*)     All other components within normal limits  COMPREHENSIVE METABOLIC PANEL - Abnormal; Notable for the following:    Sodium 134 (*)      Total Bilirubin 0.2 (*)     GFR calc non Af Amer 83 (*)     All other components within normal limits  URINE MICROSCOPIC-ADD ON - Abnormal; Notable for the following:    Squamous Epithelial / LPF FEW (*)     Bacteria, UA FEW (*)     All other components within normal limits   No results found. Results for orders placed during the hospital encounter of 06/17/12  URINALYSIS, ROUTINE W REFLEX MICROSCOPIC      Component Value Range   Color, Urine YELLOW  YELLOW   APPearance CLOUDY (*) CLEAR   Specific Gravity, Urine 1.015  1.005 - 1.030   pH 5.5  5.0 - 8.0   Glucose, UA NEGATIVE  NEGATIVE mg/dL   Hgb urine dipstick LARGE (*) NEGATIVE   Bilirubin Urine NEGATIVE  NEGATIVE   Ketones, ur NEGATIVE  NEGATIVE mg/dL   Protein, ur NEGATIVE  NEGATIVE mg/dL   Urobilinogen, UA 0.2  0.0 - 1.0 mg/dL   Nitrite  NEGATIVE  NEGATIVE   Leukocytes, UA LARGE (*) NEGATIVE  CBC      Component Value Range   WBC 12.7 (*) 4.0 - 10.5 K/uL   RBC 4.33  3.87 - 5.11 MIL/uL   Hemoglobin 13.6  12.0 - 15.0 g/dL   HCT 04.5  40.9 - 81.1 %   MCV 90.8  78.0 - 100.0 fL   MCH 31.4  26.0 - 34.0 pg   MCHC 34.6  30.0 - 36.0 g/dL   RDW 91.4  78.2 - 95.6 %   Platelets 352  150 - 400 K/uL  COMPREHENSIVE METABOLIC PANEL      Component Value Range   Sodium 134 (*) 135 - 145 mEq/L   Potassium 4.4  3.5 - 5.1 mEq/L   Chloride 100  96 - 112 mEq/L   CO2 25  19 - 32 mEq/L   Glucose, Bld 89  70 - 99 mg/dL   BUN 11  6 - 23 mg/dL   Creatinine, Ser 2.13  0.50 - 1.10 mg/dL   Calcium 9.1  8.4 - 08.6 mg/dL   Total Protein 7.4  6.0 - 8.3 g/dL   Albumin 3.7  3.5 - 5.2 g/dL   AST 14  0 - 37 U/L   ALT 11  0 - 35 U/L   Alkaline Phosphatase 69  39 - 117 U/L   Total Bilirubin 0.2 (*) 0.3 - 1.2 mg/dL   GFR calc non Af Amer 83 (*) >90 mL/min   GFR calc Af Amer >90  >90 mL/min  URINE MICROSCOPIC-ADD ON      Component Value Range   Squamous Epithelial / LPF FEW (*) RARE   WBC, UA TOO NUMEROUS TO COUNT  <3 WBC/hpf   RBC / HPF 11-20  <3  RBC/hpf   Bacteria, UA FEW (*) RARE   No results found.    1. UTI (lower urinary tract infection)       MDM  [name] presents with abdominal pain, frequency, urgency, hematuria.  Patient is young, healthy afebrile nontoxic nonseptic appearing.  UA with white blood cells too numerous to count, hematuria, bacteria, leukocytes indicative of UTI.  Will prescribe Bactrim.  I have discussed vaginal cleanliness and the need for urination after sexual intercourse.  I have also discussed reasons to return immediately to the ER.  Patient states understanding.    1. Medications: Bactrim, Norco 2. Treatment: Rest, drink clear fluids, take medications as prescribed 3. Follow Up: With primary care or emergency department if symptoms worsen         Dierdre Forth, PA-C 06/17/12 1235

## 2012-07-15 ENCOUNTER — Encounter (HOSPITAL_COMMUNITY): Payer: Self-pay

## 2012-07-15 ENCOUNTER — Emergency Department (HOSPITAL_COMMUNITY)
Admission: EM | Admit: 2012-07-15 | Discharge: 2012-07-15 | Disposition: A | Discharge status: Home / Self Care | Payer: Self-pay | Attending: Emergency Medicine | Admitting: Emergency Medicine

## 2012-07-15 ENCOUNTER — Emergency Department (HOSPITAL_COMMUNITY): Payer: Self-pay

## 2012-07-15 DIAGNOSIS — R51 Headache: Secondary | ICD-10-CM | POA: Insufficient documentation

## 2012-07-15 DIAGNOSIS — S022XXA Fracture of nasal bones, initial encounter for closed fracture: Secondary | ICD-10-CM | POA: Insufficient documentation

## 2012-07-15 DIAGNOSIS — M5030 Other cervical disc degeneration, unspecified cervical region: Secondary | ICD-10-CM | POA: Insufficient documentation

## 2012-07-15 MED ORDER — OXYCODONE-ACETAMINOPHEN 5-325 MG PO TABS: 2.0000 | ORAL_TABLET | ORAL | Status: DC | PRN

## 2012-07-15 MED ORDER — OXYCODONE-ACETAMINOPHEN 5-325 MG PO TABS
2.0000 | ORAL_TABLET | Freq: Once | ORAL | Status: AC
  Administered 2012-07-15: 2 via ORAL
  Filled 2012-07-15: qty 2
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
## 2012-12-09 ENCOUNTER — Emergency Department (HOSPITAL_COMMUNITY): Payer: Self-pay

## 2012-12-09 ENCOUNTER — Encounter (HOSPITAL_COMMUNITY): Payer: Self-pay | Admitting: Nurse Practitioner

## 2012-12-09 ENCOUNTER — Emergency Department (HOSPITAL_COMMUNITY)
Admission: EM | Admit: 2012-12-09 | Discharge: 2012-12-09 | Disposition: A | Discharge status: Home / Self Care | Payer: Self-pay | Attending: Emergency Medicine | Admitting: Emergency Medicine

## 2012-12-09 DIAGNOSIS — S43006A Unspecified dislocation of unspecified shoulder joint, initial encounter: Secondary | ICD-10-CM | POA: Insufficient documentation

## 2012-12-09 DIAGNOSIS — Z8619 Personal history of other infectious and parasitic diseases: Secondary | ICD-10-CM | POA: Insufficient documentation

## 2012-12-09 DIAGNOSIS — F329 Major depressive disorder, single episode, unspecified: Secondary | ICD-10-CM | POA: Insufficient documentation

## 2012-12-09 DIAGNOSIS — F3289 Other specified depressive episodes: Secondary | ICD-10-CM | POA: Insufficient documentation

## 2012-12-09 DIAGNOSIS — Y9329 Activity, other involving ice and snow: Secondary | ICD-10-CM | POA: Insufficient documentation

## 2012-12-09 DIAGNOSIS — W278XXA Contact with other nonpowered hand tool, initial encounter: Secondary | ICD-10-CM | POA: Insufficient documentation

## 2012-12-09 DIAGNOSIS — F17200 Nicotine dependence, unspecified, uncomplicated: Secondary | ICD-10-CM | POA: Insufficient documentation

## 2012-12-09 DIAGNOSIS — F4310 Post-traumatic stress disorder, unspecified: Secondary | ICD-10-CM | POA: Insufficient documentation

## 2012-12-09 DIAGNOSIS — Y9289 Other specified places as the place of occurrence of the external cause: Secondary | ICD-10-CM | POA: Insufficient documentation

## 2012-12-09 DIAGNOSIS — Z8742 Personal history of other diseases of the female genital tract: Secondary | ICD-10-CM | POA: Insufficient documentation

## 2012-12-09 DIAGNOSIS — Y990 Civilian activity done for income or pay: Secondary | ICD-10-CM | POA: Insufficient documentation

## 2012-12-09 DIAGNOSIS — S43001A Unspecified subluxation of right shoulder joint, initial encounter: Secondary | ICD-10-CM

## 2012-12-09 DIAGNOSIS — IMO0001 Reserved for inherently not codable concepts without codable children: Secondary | ICD-10-CM | POA: Insufficient documentation

## 2012-12-09 DIAGNOSIS — Z79899 Other long term (current) drug therapy: Secondary | ICD-10-CM | POA: Insufficient documentation

## 2012-12-09 DIAGNOSIS — F909 Attention-deficit hyperactivity disorder, unspecified type: Secondary | ICD-10-CM | POA: Insufficient documentation

## 2012-12-09 MED ORDER — OXYCODONE-ACETAMINOPHEN 5-325 MG PO TABS
2.0000 | ORAL_TABLET | Freq: Once | ORAL | Status: AC
  Administered 2012-12-09: 2 via ORAL
  Filled 2012-12-09: qty 2

## 2012-12-09 MED ORDER — OXYCODONE-ACETAMINOPHEN 5-325 MG PO TABS: 2.0000 | ORAL_TABLET | ORAL | Status: DC | PRN

## 2012-12-09 NOTE — ED Provider Notes (Addendum)
History     CSN: [id number]  Arrival date & time 12/09/12  8295   First MD Initiated Contact with Patient 12/09/12 [phone]      Chief Complaint  Patient presents with  . Shoulder Pain    right    (Consider location/radiation/quality/duration/timing/severity/associated sxs/prior treatment) Patient is a 47 y.o. female presenting with shoulder pain. The history is provided by the patient. No language interpreter was used.  Shoulder Pain This is a new problem. The current episode started today. The problem occurs constantly. The problem has been gradually worsening. Associated symptoms include arthralgias. Pertinent negatives include no fever, joint swelling, nausea, vomiting or weakness. Exacerbated by: hyperextension. She has tried nothing for the symptoms.   47 year old female coming in with right shoulder pain. States that she woke with the right shoulder pain after shoveling snow yesterday, pushing a car out of the snow, and help in her husband picked up a transmission 2 days ago. She's taken ibuprofen with no relief. She has had physical therapy and  Hydrocortisone shots in the shoulder in the remote past.   Past Medical History  Diagnosis Date  . PID (acute pelvic inflammatory disease)   . Chlamydia   . Depression   . PTSD (post-traumatic stress disorder)   . ADD (attention deficit disorder with hyperactivity)   . Bilateral ovarian cysts     Past Surgical History  Procedure Laterality Date  . Back surgery      dbl lumbar fusion L5-S1  . Bladder surgery    . Abdominal hysterectomy    . Laparoscopy      No family history on file.  History  Substance Use Topics  . Smoking status: Current Every Day Smoker -- 0.50 packs/day  . Smokeless tobacco: Not on file  . Alcohol Use: No    OB History   Grav Para Term Preterm Abortions TAB SAB Ect Mult Living                  Review of Systems  Constitutional: Negative.  Negative for fever.  HENT: Negative.   Eyes: Negative.    Respiratory: Negative.   Cardiovascular: Negative.   Gastrointestinal: Negative.  Negative for nausea and vomiting.  Musculoskeletal: Positive for arthralgias. Negative for joint swelling.  Neurological: Negative.  Negative for weakness.  Psychiatric/Behavioral: Negative.   All other systems reviewed and are negative.    Allergies  Imitrex; Clindamycin/lincomycin; Morphine and related; and Vicodin  Home Medications   Current Outpatient Rx  Name  Route  Sig  Dispense  Refill  . ALPRAZolam (XANAX) 1 MG tablet   Oral   Take 1 mg by mouth 3 (three) times daily as needed. anxiety         . amphetamine-dextroamphetamine (ADDERALL) 20 MG tablet   Oral   Take 20 mg by mouth 3 (three) times daily as needed. For alertness.         Marland Kitchen ibuprofen (ADVIL,MOTRIN) 200 MG tablet   Oral   Take 200 mg by mouth every 6 (six) hours as needed for pain.         Marland Kitchen oxyCODONE-acetaminophen (PERCOCET/ROXICET) 5-325 MG per tablet   Oral   Take 2 tablets by mouth every 4 (four) hours as needed for pain.   20 tablet   0   . sertraline (ZOLOFT) 100 MG tablet   Oral   Take 200 mg by mouth daily.             BP 141/85  Pulse 81  Temp(Src) 98.1 F (36.7 C) (Oral)  SpO2 100%  Physical Exam  Nursing note and vitals reviewed. Constitutional: She is oriented to person, place, and time. She appears well-developed and well-nourished.  HENT:  Head: Normocephalic and atraumatic.  Eyes: Conjunctivae and EOM are normal. Pupils are equal, round, and reactive to light.  Neck: Normal range of motion. Neck supple.  Cardiovascular: Normal rate.   Pulmonary/Chest: Effort normal.  Abdominal: Soft.  Musculoskeletal: Normal range of motion. She exhibits tenderness. She exhibits no edema.  R shoulder tenderness  Neurological: She is alert and oriented to person, place, and time. She has normal reflexes.  Skin: Skin is warm and dry.  Psychiatric: She has a normal mood and affect.    ED Course   Procedures (including critical care time)  Labs Reviewed - No data to display Dg Shoulder Right  12/09/2012  *RADIOLOGY REPORT*  Clinical Data: Shoulder pain  RIGHT SHOULDER - 2+ VIEW  Comparison: None.  Findings: Three views of the right shoulder submitted.  No acute fracture . There is widening of the subacromial space suspicious for mild inferior subluxation of the humeral head.  IMPRESSION: No acute fracture.  Widening of the subacromial space suspicious for mild inferior subluxation of the humeral head.   Original Report Authenticated By: Natasha Mead, M.D.      No diagnosis found.    MDM  Subluxation or R shoulder.  Manipulated in the ER with normal repeat x-ray reviewed by myself.   Sling provided and instructed to keep on until followup with ortho.  Percocet/ice/ibuprofen for pain.         Remi Haggard, NP 12/09/12 1136  Remi Haggard, NP 12/21/12 1059

## 2012-12-09 NOTE — ED Provider Notes (Signed)
Medical screening examination/treatment/procedure(s) were performed by non-physician practitioner and as supervising physician I was immediately available for consultation/collaboration.   Louie Flenner, MD 12/09/12 1450 

## 2012-12-09 NOTE — ED Notes (Signed)
Per patient: Shoveling snow yesterday and doing heavy duty work 3 days ago.  Discomfort started a few days ago, but woke up with constant throbbing pain and it is a stabbing sensation with movement.

## 2012-12-21 NOTE — ED Provider Notes (Signed)
Medical screening examination/treatment/procedure(s) were performed by non-physician practitioner and as supervising physician I was immediately available for consultation/collaboration.   Loren Racer, MD 12/21/12 [phone]

## 2012-12-27 ENCOUNTER — Emergency Department (HOSPITAL_COMMUNITY)
Admission: EM | Admit: 2012-12-27 | Discharge: 2012-12-27 | Disposition: A | Discharge status: Home / Self Care | Payer: Self-pay | Attending: Emergency Medicine | Admitting: Emergency Medicine

## 2012-12-27 ENCOUNTER — Encounter (HOSPITAL_COMMUNITY): Payer: Self-pay | Admitting: Family Medicine

## 2012-12-27 ENCOUNTER — Emergency Department (HOSPITAL_COMMUNITY): Payer: Self-pay

## 2012-12-27 DIAGNOSIS — S4991XD Unspecified injury of right shoulder and upper arm, subsequent encounter: Secondary | ICD-10-CM

## 2012-12-27 DIAGNOSIS — Y9389 Activity, other specified: Secondary | ICD-10-CM | POA: Insufficient documentation

## 2012-12-27 DIAGNOSIS — F17200 Nicotine dependence, unspecified, uncomplicated: Secondary | ICD-10-CM | POA: Insufficient documentation

## 2012-12-27 DIAGNOSIS — Z8619 Personal history of other infectious and parasitic diseases: Secondary | ICD-10-CM | POA: Insufficient documentation

## 2012-12-27 DIAGNOSIS — X500XXA Overexertion from strenuous movement or load, initial encounter: Secondary | ICD-10-CM | POA: Insufficient documentation

## 2012-12-27 DIAGNOSIS — Z8659 Personal history of other mental and behavioral disorders: Secondary | ICD-10-CM | POA: Insufficient documentation

## 2012-12-27 DIAGNOSIS — F909 Attention-deficit hyperactivity disorder, unspecified type: Secondary | ICD-10-CM | POA: Insufficient documentation

## 2012-12-27 DIAGNOSIS — Z8742 Personal history of other diseases of the female genital tract: Secondary | ICD-10-CM | POA: Insufficient documentation

## 2012-12-27 DIAGNOSIS — S4980XA Other specified injuries of shoulder and upper arm, unspecified arm, initial encounter: Secondary | ICD-10-CM | POA: Insufficient documentation

## 2012-12-27 DIAGNOSIS — Z79899 Other long term (current) drug therapy: Secondary | ICD-10-CM | POA: Insufficient documentation

## 2012-12-27 DIAGNOSIS — Y9289 Other specified places as the place of occurrence of the external cause: Secondary | ICD-10-CM | POA: Insufficient documentation

## 2012-12-27 DIAGNOSIS — S46909A Unspecified injury of unspecified muscle, fascia and tendon at shoulder and upper arm level, unspecified arm, initial encounter: Secondary | ICD-10-CM | POA: Insufficient documentation

## 2012-12-27 MED ORDER — IBUPROFEN 800 MG PO TABS: 800.0000 mg | ORAL_TABLET | Freq: Three times a day (TID) | ORAL | Status: DC | PRN

## 2012-12-27 MED ORDER — OXYCODONE-ACETAMINOPHEN 5-325 MG PO TABS: 1.0000 | ORAL_TABLET | Freq: Four times a day (QID) | ORAL | Status: DC | PRN

## 2012-12-27 MED ORDER — OXYCODONE-ACETAMINOPHEN 5-325 MG PO TABS
1.0000 | ORAL_TABLET | Freq: Once | ORAL | Status: AC
  Administered 2012-12-27: 1 via ORAL
  Filled 2012-12-27: qty 1

## 2012-12-27 MED ORDER — IBUPROFEN 800 MG PO TABS
800.0000 mg | ORAL_TABLET | Freq: Once | ORAL | Status: AC
  Administered 2012-12-27: 800 mg via ORAL
  Filled 2012-12-27: qty 1

## 2012-12-27 NOTE — ED Provider Notes (Signed)
Medical screening examination/treatment/procedure(s) were performed by non-physician practitioner and as supervising physician I was immediately available for consultation/collaboration.   Hanley Seamen, MD 12/27/12 [phone]

## 2012-12-27 NOTE — ED Provider Notes (Signed)
History     CSN: [id number]  Arrival date & time 12/27/12  1478   First MD Initiated Contact with Patient 12/27/12 [phone]      Chief Complaint  Patient presents with  . Shoulder Pain    (Consider location/radiation/quality/duration/timing/severity/associated sxs/prior treatment) HPI Patient presents to the emergency department with right shoulder pain.  The patient, states she was in a truck where the door did not open easily and she pushed it with her right shoulder and felt a pop.  Patient, states she previously injured her shoulder.  Patient, states she started wearing the sling again after this episode.  Patient denies nausea, vomiting, numbness, weakness, or fever.  Patient, states she took ibuprofen for pain without significant relief.  Patient also, states she was using ice with no significant relief.  Patient, states, that movement and palpation make the pain, worse. Past Medical History  Diagnosis Date  . PID (acute pelvic inflammatory disease)   . Chlamydia   . Depression   . PTSD (post-traumatic stress disorder)   . ADD (attention deficit disorder with hyperactivity)   . Bilateral ovarian cysts     Past Surgical History  Procedure Laterality Date  . Back surgery      dbl lumbar fusion L5-S1  . Bladder surgery    . Abdominal hysterectomy    . Laparoscopy      No family history on file.  History  Substance Use Topics  . Smoking status: Current Every Day Smoker -- 0.25 packs/day  . Smokeless tobacco: Not on file  . Alcohol Use: No    OB History   Grav Para Term Preterm Abortions TAB SAB Ect Mult Living                  Review of Systems All other systems negative except as documented in the HPI. All pertinent positives and negatives as reviewed in the HPI. Allergies  Imitrex; Clindamycin/lincomycin; Morphine and related; and Vicodin  Home Medications   Current Outpatient Rx  Name  Route  Sig  Dispense  Refill  . ALPRAZolam (XANAX) 1 MG tablet   Oral   Take 1 mg by mouth 3 (three) times daily as needed. anxiety         . amphetamine-dextroamphetamine (ADDERALL) 20 MG tablet   Oral   Take 20 mg by mouth 3 (three) times daily as needed. For alertness.         Marland Kitchen ibuprofen (ADVIL,MOTRIN) 200 MG tablet   Oral   Take 200 mg by mouth every 6 (six) hours as needed for pain.         Marland Kitchen oxyCODONE-acetaminophen (PERCOCET/ROXICET) 5-325 MG per tablet   Oral   Take 2 tablets by mouth every 4 (four) hours as needed for pain.   20 tablet   0   . oxyCODONE-acetaminophen (PERCOCET/ROXICET) 5-325 MG per tablet   Oral   Take 2 tablets by mouth every 4 (four) hours as needed for pain (can take with ibuprofen).   15 tablet   0   . sertraline (ZOLOFT) 100 MG tablet   Oral   Take 200 mg by mouth daily.             BP 129/80  Pulse 64  Temp(Src) 97.7 F (36.5 C) (Oral)  Resp 21  SpO2 100%  Physical Exam  Nursing note and vitals reviewed. Constitutional: She appears well-developed and well-nourished.  HENT:  Head: Normocephalic and atraumatic.  Neck: Normal range of motion. Neck supple.  Cardiovascular: Normal rate, regular rhythm and normal heart sounds.   Pulmonary/Chest: Effort normal and breath sounds normal.  Musculoskeletal:       Right shoulder: She exhibits decreased range of motion, tenderness and pain. She exhibits no swelling, no effusion, no deformity, normal pulse and normal strength.       Arms: Skin: Skin is warm and dry.    ED Course  Procedures (including critical care time)  Labs Reviewed - No data to display Dg Shoulder Right  12/27/2012  *RADIOLOGY REPORT*  Clinical Data: Injury to right shoulder; severe right shoulder pain and limited range of motion.  RIGHT SHOULDER - 2+ VIEW  Comparison: Right shoulder radiographs performed 12/09/2012  Findings: There is no evidence of fracture or dislocation.  The right humeral head is seated within the glenoid fossa.  The acromioclavicular joint is unremarkable in  appearance.  No significant soft tissue abnormalities are seen.  The visualized portions of the right lung are clear.  IMPRESSION: No evidence of fracture or dislocation.   Original Report Authenticated By: Tonia Ghent, M.D.     The patient will be referred to orthopedics. SHe is advised to use ice and heat on her shoulder.  She is also told to wear her sling for comfort.  Told to return here for any worsening in her condition   MDM         Carlyle Dolly, PA-C 12/27/12 [phone]

## 2012-12-27 NOTE — ED Notes (Addendum)
Patient states that she dislocated her right shoulder about 3 weeks ago. States yesterday she hit the same shoulder against a car door trying to get the door open. Feels like she has dislocated her shoulder again.

## 2013-01-23 ENCOUNTER — Other Ambulatory Visit: Payer: Self-pay | Admitting: Orthopedic Surgery

## 2013-01-23 DIAGNOSIS — M25511 Pain in right shoulder: Secondary | ICD-10-CM

## 2013-01-24 ENCOUNTER — Other Ambulatory Visit: Payer: Self-pay

## 2013-01-24 ENCOUNTER — Emergency Department (HOSPITAL_COMMUNITY)
Admission: EM | Admit: 2013-01-24 | Discharge: 2013-01-24 | Disposition: A | Discharge status: Home / Self Care | Payer: No Typology Code available for payment source | Attending: Emergency Medicine | Admitting: Emergency Medicine

## 2013-01-24 ENCOUNTER — Encounter (HOSPITAL_COMMUNITY): Payer: Self-pay

## 2013-01-24 DIAGNOSIS — M25519 Pain in unspecified shoulder: Secondary | ICD-10-CM | POA: Insufficient documentation

## 2013-01-24 DIAGNOSIS — F4310 Post-traumatic stress disorder, unspecified: Secondary | ICD-10-CM | POA: Insufficient documentation

## 2013-01-24 DIAGNOSIS — F3289 Other specified depressive episodes: Secondary | ICD-10-CM | POA: Insufficient documentation

## 2013-01-24 DIAGNOSIS — Z8619 Personal history of other infectious and parasitic diseases: Secondary | ICD-10-CM | POA: Insufficient documentation

## 2013-01-24 DIAGNOSIS — F909 Attention-deficit hyperactivity disorder, unspecified type: Secondary | ICD-10-CM | POA: Insufficient documentation

## 2013-01-24 DIAGNOSIS — F329 Major depressive disorder, single episode, unspecified: Secondary | ICD-10-CM | POA: Insufficient documentation

## 2013-01-24 DIAGNOSIS — Z79899 Other long term (current) drug therapy: Secondary | ICD-10-CM | POA: Insufficient documentation

## 2013-01-24 DIAGNOSIS — Z8742 Personal history of other diseases of the female genital tract: Secondary | ICD-10-CM | POA: Insufficient documentation

## 2013-01-24 DIAGNOSIS — M25511 Pain in right shoulder: Secondary | ICD-10-CM

## 2013-01-24 DIAGNOSIS — F17200 Nicotine dependence, unspecified, uncomplicated: Secondary | ICD-10-CM | POA: Insufficient documentation

## 2013-01-24 DIAGNOSIS — Z87828 Personal history of other (healed) physical injury and trauma: Secondary | ICD-10-CM | POA: Insufficient documentation

## 2013-01-24 MED ORDER — IBUPROFEN 800 MG PO TABS: 800.0000 mg | ORAL_TABLET | Freq: Three times a day (TID) | ORAL | Status: DC

## 2013-01-24 NOTE — ED Notes (Signed)
Pt states possible re-injury to rt arm.  Has had hx of same pain and pulled arm yesterday opening door.  Is scheduled MRI, Friday.

## 2013-01-24 NOTE — ED Provider Notes (Signed)
History    This chart was scribed for non-physician practitioner working with Toy Baker, MD by ED Scribe, Burman Nieves. This patient was seen in room WTR5/WTR5 and the patient's care was started at 9:54 PM.   CSN: [id number]  Arrival date & time 01/24/13  1954   First MD Initiated Contact with Patient 01/24/13 2154      Chief Complaint  Patient presents with  . Shoulder Pain    (Consider location/radiation/quality/duration/timing/severity/associated sxs/prior treatment) Patient is a 47 y.o. female presenting with shoulder pain. The history is provided by the patient. No language interpreter was used.  Shoulder Pain This is a recurrent problem. The current episode started yesterday. The problem occurs constantly. The problem has not changed since onset.Exacerbated by: movement. Nothing relieves the symptoms.   [name] is a 47 y.o. female who presents to the Emergency Department complaining of moderate constant right shoulder pain onset yesterday. Pt states that she tried to grab a bay door yesterday and strained it.  Pt has old R shoulder injury and feels this has exacerbated her pain.  She states the pain has become unbearable and pops upon movement. Pt states she has been taking ibuprofen with no immediate relief. Pt denies fever, chills, cough, nausea, vomiting, diarrhea, SOB, weakness, and any other associated symptoms.  Intermittent paresthesias of R hand, not of new onset.  Pt is scheduled to have an MRI with orthopedic this Friday.    Past Medical History  Diagnosis Date  . PID (acute pelvic inflammatory disease)   . Chlamydia   . Depression   . PTSD (post-traumatic stress disorder)   . ADD (attention deficit disorder with hyperactivity)   . Bilateral ovarian cysts     Past Surgical History  Procedure Laterality Date  . Back surgery      dbl lumbar fusion L5-S1  . Bladder surgery    . Abdominal hysterectomy    . Laparoscopy      History reviewed. No  pertinent family history.  History  Substance Use Topics  . Smoking status: Current Every Day Smoker -- 0.25 packs/day  . Smokeless tobacco: Not on file  . Alcohol Use: No    OB History   Grav Para Term Preterm Abortions TAB SAB Ect Mult Living                  Review of Systems  Musculoskeletal: Positive for arthralgias.  All other systems reviewed and are negative.    Allergies  Imitrex; Clindamycin/lincomycin; Morphine and related; and Vicodin  Home Medications   Current Outpatient Rx  Name  Route  Sig  Dispense  Refill  . ALPRAZolam (XANAX) 1 MG tablet   Oral   Take 1 mg by mouth 3 (three) times daily as needed. anxiety         . amphetamine-dextroamphetamine (ADDERALL) 20 MG tablet   Oral   Take 20 mg by mouth 3 (three) times daily as needed. For alertness.         Marland Kitchen ibuprofen (ADVIL,MOTRIN) 200 MG tablet   Oral   Take 800 mg by mouth every 6 (six) hours as needed for pain.          Marland Kitchen oxyCODONE-acetaminophen (PERCOCET/ROXICET) 5-325 MG per tablet   Oral   Take 2 tablets by mouth every 4 (four) hours as needed for pain.   20 tablet   0   . sertraline (ZOLOFT) 100 MG tablet   Oral   Take 200 mg by mouth  daily.             BP 148/82  Pulse 73  Temp(Src) 98.6 F (37 C) (Oral)  Resp 20  SpO2 100%  Physical Exam  Nursing note and vitals reviewed. Constitutional: She is oriented to person, place, and time. She appears well-developed and well-nourished. No distress.  HENT:  Head: Normocephalic and atraumatic.  Eyes: Conjunctivae and EOM are normal.  Neck: Normal range of motion. Neck supple.  Cardiovascular: Normal rate, regular rhythm and normal heart sounds.   No murmur heard. Pulmonary/Chest: Effort normal and breath sounds normal. No respiratory distress.  Musculoskeletal: She exhibits tenderness.       Right shoulder: She exhibits tenderness, bony tenderness and pain. She exhibits normal range of motion, no swelling, no deformity, no  spasm, normal pulse and normal strength.  Tenderness upon palpation to the right shoulder, distal sensation intact, strong cap refill  Neurological: She is alert and oriented to person, place, and time. She has normal reflexes. No cranial nerve deficit. Coordination normal.  Skin: Skin is warm, dry and intact. She is not diaphoretic. No erythema. No pallor.  Psychiatric: She has a normal mood and affect. Her behavior is normal.    ED Course  Procedures (including critical care time) DIAGNOSTIC STUDIES: Oxygen Saturation is 100% on room air, normal by my interpretation.    COORDINATION OF CARE: 10:00 PM Discussed ED treatment with pt and pt agrees.     Labs Reviewed - No data to display No results found.   1. Shoulder joint pain, right       MDM   Pt presenting with exacerbation of R shoulder pain from prior injury.  Substance database shows that pt has been given 120+ percocet in the past month.  Rx ibuprofen only.  Has previously scheduled FU with ortho on Friday for MRI.  Return precautions advised.  I personally performed the services described in this documentation, which was scribed in my presence. The recorded information has been reviewed and is accurate.         Garlon Hatchet, PA-C 01/25/13 1723

## 2013-01-26 NOTE — ED Provider Notes (Signed)
Medical screening examination/treatment/procedure(s) were performed by non-physician practitioner and as supervising physician I was immediately available for consultation/collaboration.  Toy Baker, MD 01/26/13 1143

## 2013-01-27 ENCOUNTER — Ambulatory Visit
Admission: RE | Admit: 2013-01-27 | Discharge: 2013-01-27 | Disposition: A | Discharge status: Home / Self Care | Payer: No Typology Code available for payment source | Source: Ambulatory Visit | Attending: Orthopedic Surgery | Admitting: Orthopedic Surgery

## 2013-01-27 DIAGNOSIS — M25511 Pain in right shoulder: Secondary | ICD-10-CM

## 2013-02-15 HISTORY — PX: ROTATOR CUFF REPAIR: SHX139

## 2013-03-22 ENCOUNTER — Ambulatory Visit: Payer: No Typology Code available for payment source | Attending: Orthopedic Surgery | Admitting: Physical Therapy

## 2013-03-22 DIAGNOSIS — IMO0001 Reserved for inherently not codable concepts without codable children: Secondary | ICD-10-CM | POA: Diagnosis present

## 2013-03-22 DIAGNOSIS — R293 Abnormal posture: Secondary | ICD-10-CM | POA: Insufficient documentation

## 2013-03-22 DIAGNOSIS — M25519 Pain in unspecified shoulder: Secondary | ICD-10-CM | POA: Insufficient documentation

## 2013-03-28 ENCOUNTER — Encounter (HOSPITAL_COMMUNITY): Payer: Self-pay | Admitting: Emergency Medicine

## 2013-03-28 ENCOUNTER — Emergency Department (HOSPITAL_COMMUNITY): Payer: No Typology Code available for payment source

## 2013-03-28 ENCOUNTER — Emergency Department (HOSPITAL_COMMUNITY)
Admission: EM | Admit: 2013-03-28 | Discharge: 2013-03-28 | Disposition: A | Discharge status: Home / Self Care | Payer: No Typology Code available for payment source | Attending: Emergency Medicine | Admitting: Emergency Medicine

## 2013-03-28 DIAGNOSIS — F909 Attention-deficit hyperactivity disorder, unspecified type: Secondary | ICD-10-CM | POA: Insufficient documentation

## 2013-03-28 DIAGNOSIS — F3289 Other specified depressive episodes: Secondary | ICD-10-CM | POA: Insufficient documentation

## 2013-03-28 DIAGNOSIS — Z8619 Personal history of other infectious and parasitic diseases: Secondary | ICD-10-CM | POA: Insufficient documentation

## 2013-03-28 DIAGNOSIS — Z87448 Personal history of other diseases of urinary system: Secondary | ICD-10-CM | POA: Insufficient documentation

## 2013-03-28 DIAGNOSIS — B9689 Other specified bacterial agents as the cause of diseases classified elsewhere: Secondary | ICD-10-CM

## 2013-03-28 DIAGNOSIS — N760 Acute vaginitis: Secondary | ICD-10-CM | POA: Insufficient documentation

## 2013-03-28 DIAGNOSIS — F329 Major depressive disorder, single episode, unspecified: Secondary | ICD-10-CM | POA: Insufficient documentation

## 2013-03-28 DIAGNOSIS — F17200 Nicotine dependence, unspecified, uncomplicated: Secondary | ICD-10-CM | POA: Insufficient documentation

## 2013-03-28 DIAGNOSIS — A599 Trichomoniasis, unspecified: Secondary | ICD-10-CM

## 2013-03-28 DIAGNOSIS — F4310 Post-traumatic stress disorder, unspecified: Secondary | ICD-10-CM | POA: Insufficient documentation

## 2013-03-28 DIAGNOSIS — R109 Unspecified abdominal pain: Secondary | ICD-10-CM

## 2013-03-28 DIAGNOSIS — A499 Bacterial infection, unspecified: Secondary | ICD-10-CM | POA: Insufficient documentation

## 2013-03-28 DIAGNOSIS — Z8742 Personal history of other diseases of the female genital tract: Secondary | ICD-10-CM | POA: Insufficient documentation

## 2013-03-28 DIAGNOSIS — Z90710 Acquired absence of both cervix and uterus: Secondary | ICD-10-CM | POA: Insufficient documentation

## 2013-03-28 LAB — BASIC METABOLIC PANEL
BUN: 8 mg/dL (ref 6–23)
CO2: 29 mEq/L (ref 19–32)
Calcium: 9.5 mg/dL (ref 8.4–10.5)
Creatinine, Ser: 0.81 mg/dL (ref 0.50–1.10)
Glucose, Bld: 96 mg/dL (ref 70–99)

## 2013-03-28 LAB — HEPATIC FUNCTION PANEL
ALT: 7 U/L (ref 0–35)
Alkaline Phosphatase: 68 U/L (ref 39–117)
Total Bilirubin: 0.3 mg/dL (ref 0.3–1.2)

## 2013-03-28 LAB — CBC WITH DIFFERENTIAL/PLATELET
Basophils Absolute: 0 10*3/uL (ref 0.0–0.1)
Basophils Relative: 0 % (ref 0–1)
Eosinophils Absolute: 0.1 10*3/uL (ref 0.0–0.7)
Eosinophils Relative: 1 % (ref 0–5)
MCH: 30.9 pg (ref 26.0–34.0)
MCHC: 34.8 g/dL (ref 30.0–36.0)
MCV: 88.9 fL (ref 78.0–100.0)
Platelets: 410 10*3/uL — ABNORMAL HIGH (ref 150–400)
RDW: 12.9 % (ref 11.5–15.5)
WBC: 8.8 10*3/uL (ref 4.0–10.5)

## 2013-03-28 LAB — WET PREP, GENITAL: Yeast Wet Prep HPF POC: NONE SEEN

## 2013-03-28 LAB — URINALYSIS, ROUTINE W REFLEX MICROSCOPIC
Glucose, UA: NEGATIVE mg/dL
Ketones, ur: NEGATIVE mg/dL
Protein, ur: NEGATIVE mg/dL

## 2013-03-28 LAB — LIPASE, BLOOD: Lipase: 23 U/L (ref 11–59)

## 2013-03-28 LAB — URINE MICROSCOPIC-ADD ON

## 2013-03-28 MED ORDER — FENTANYL CITRATE 0.05 MG/ML IJ SOLN
100.0000 ug | Freq: Once | INTRAMUSCULAR | Status: AC
  Administered 2013-03-28: 100 ug via INTRAVENOUS
  Filled 2013-03-28: qty 2

## 2013-03-28 MED ORDER — IOHEXOL 300 MG/ML  SOLN
50.0000 mL | Freq: Once | INTRAMUSCULAR | Status: AC | PRN
  Administered 2013-03-28: 50 mL via ORAL

## 2013-03-28 MED ORDER — HYDROMORPHONE HCL PF 1 MG/ML IJ SOLN
1.0000 mg | Freq: Once | INTRAMUSCULAR | Status: AC
  Administered 2013-03-28: 1 mg via INTRAVENOUS
  Filled 2013-03-28: qty 1

## 2013-03-28 MED ORDER — PROMETHAZINE HCL 25 MG PO TABS: 25.0000 mg | ORAL_TABLET | Freq: Four times a day (QID) | ORAL | Status: DC | PRN

## 2013-03-28 MED ORDER — SODIUM CHLORIDE 0.9 % IV SOLN
Freq: Once | INTRAVENOUS | Status: AC
  Administered 2013-03-28: 16:00:00 via INTRAVENOUS

## 2013-03-28 MED ORDER — METRONIDAZOLE 500 MG PO TABS: 500.0000 mg | ORAL_TABLET | Freq: Two times a day (BID) | ORAL | Status: DC

## 2013-03-28 MED ORDER — TRAMADOL HCL 50 MG PO TABS: 50.0000 mg | ORAL_TABLET | Freq: Four times a day (QID) | ORAL | Status: DC | PRN

## 2013-03-28 MED ORDER — IOHEXOL 300 MG/ML  SOLN
100.0000 mL | Freq: Once | INTRAMUSCULAR | Status: AC | PRN
  Administered 2013-03-28: 100 mL via INTRAVENOUS

## 2013-03-28 MED ORDER — ONDANSETRON HCL 4 MG/2ML IJ SOLN
4.0000 mg | Freq: Once | INTRAMUSCULAR | Status: AC
  Administered 2013-03-28: 4 mg via INTRAVENOUS
  Filled 2013-03-28: qty 2

## 2013-03-28 NOTE — ED Provider Notes (Signed)
History     CSN: [id number]  Arrival date & time 03/28/13  1409   First MD Initiated Contact with Patient 03/28/13 1547      Chief Complaint  Patient presents with  . Abdominal Pain    (Consider location/radiation/quality/duration/timing/severity/associated sxs/prior treatment) HPI.... lower abdominal pain since Friday.  Pain is constant and described as sharp. It is centrally located over the bladder. No radiation. Status post hysterectomy many years ago. Patient has had ruptured ovarian cysts in the past. Also diagnosed with interstitial cystitis and a bladder tacking procedure. No vaginal bleeding or discharge. She is sexually active. No dysuria, fever, chills, nausea, vomiting, diarrhea.  Past Medical History  Diagnosis Date  . PID (acute pelvic inflammatory disease)   . Chlamydia   . Depression   . PTSD (post-traumatic stress disorder)   . ADD (attention deficit disorder with hyperactivity)   . Bilateral ovarian cysts     Past Surgical History  Procedure Laterality Date  . Back surgery      dbl lumbar fusion L5-S1  . Bladder surgery    . Abdominal hysterectomy    . Laparoscopy      No family history on file.  History  Substance Use Topics  . Smoking status: Current Every Day Smoker -- 0.25 packs/day  . Smokeless tobacco: Not on file  . Alcohol Use: No    OB History   Grav Para Term Preterm Abortions TAB SAB Ect Mult Living                  Review of Systems  All other systems reviewed and are negative.    Allergies  Imitrex; Clindamycin/lincomycin; Morphine and related; and Vicodin  Home Medications   Current Outpatient Rx  Name  Route  Sig  Dispense  Refill  . ALPRAZolam (XANAX) 1 MG tablet   Oral   Take 1 mg by mouth 3 (three) times daily as needed. anxiety         . amphetamine-dextroamphetamine (ADDERALL) 20 MG tablet   Oral   Take 20 mg by mouth 3 (three) times daily as needed. For alertness.         Marland Kitchen ibuprofen (ADVIL,MOTRIN) 800 MG  tablet   Oral   Take 1 tablet (800 mg total) by mouth 3 (three) times daily.   21 tablet   0   . sertraline (ZOLOFT) 100 MG tablet   Oral   Take 200 mg by mouth daily.           . metroNIDAZOLE (FLAGYL) 500 MG tablet   Oral   Take 1 tablet (500 mg total) by mouth 2 (two) times daily. One po bid x 7 days   14 tablet   0   . promethazine (PHENERGAN) 25 MG tablet   Oral   Take 1 tablet (25 mg total) by mouth every 6 (six) hours as needed for nausea.   20 tablet   0   . traMADol (ULTRAM) 50 MG tablet   Oral   Take 1 tablet (50 mg total) by mouth every 6 (six) hours as needed for pain.   20 tablet   0     BP 126/73  Pulse 58  Temp(Src) 98.4 F (36.9 C) (Oral)  Resp 18  SpO2 97%  Physical Exam  Nursing note and vitals reviewed. Constitutional: She is oriented to person, place, and time. She appears well-developed and well-nourished.  HENT:  Head: Normocephalic and atraumatic.  Eyes: Conjunctivae and EOM are normal. Pupils  are equal, round, and reactive to light.  Neck: Normal range of motion. Neck supple.  Cardiovascular: Normal rate, regular rhythm and normal heart sounds.   Pulmonary/Chest: Effort normal and breath sounds normal.  Abdominal: Soft. Bowel sounds are normal.  Minimal bilateral lower abdominal tenderness  Genitourinary:  External genitalia normal. No cervix identified. No obvious discharge in her vault.  No adnexal tenderness .  Slight suprapubic tenderness  Musculoskeletal: Normal range of motion.  Neurological: She is alert and oriented to person, place, and time.  Skin: Skin is warm and dry.  Psychiatric: She has a normal mood and affect.    ED Course  Procedures (including critical care time)  Labs Reviewed  WET PREP, GENITAL - Abnormal; Notable for the following:    Trich, Wet Prep FEW (*)    Clue Cells Wet Prep HPF POC RARE (*)    WBC, Wet Prep HPF POC FEW (*)    All other components within normal limits  URINALYSIS, ROUTINE W REFLEX  MICROSCOPIC - Abnormal; Notable for the following:    APPearance CLOUDY (*)    Hgb urine dipstick MODERATE (*)    Leukocytes, UA MODERATE (*)    All other components within normal limits  BASIC METABOLIC PANEL - Abnormal; Notable for the following:    Potassium 3.3 (*)    GFR calc non Af Amer 87 (*)    All other components within normal limits  CBC WITH DIFFERENTIAL - Abnormal; Notable for the following:    Platelets 410 (*)    All other components within normal limits  GC/CHLAMYDIA PROBE AMP  URINE MICROSCOPIC-ADD ON  HEPATIC FUNCTION PANEL  LIPASE, BLOOD  CBC WITH DIFFERENTIAL   Ct Abdomen Pelvis W Contrast  03/28/2013   *RADIOLOGY REPORT*  Clinical Data: Abdominal pain, prior hysterectomy  CT ABDOMEN AND PELVIS WITH CONTRAST  Technique:  Multidetector CT imaging of the abdomen and pelvis was performed following the standard protocol during bolus administration of intravenous contrast.  Contrast: OMNIPAQUE IOHEXOL 300 MG/ML  SOLN, 50mL OMNIPAQUE IOHEXOL 300 MG/ML  SOLN  Comparison: Pelvic ultrasound 04/17/2012, CT 02/24/2012  Findings: Lung bases are clear.  Hepatic hypodensity may indicate steatosis.  No focal mass or intrahepatic ductal dilatation. Gallbladder, adrenal glands, kidneys, spleen, and pancreas are normal in appearance.  No ascites, lymphadenopathy, or abdominal free air.  Streak artifact from lumbar fusion hardware noted.  Post hysterectomy.  Bilateral ovarian follicles are noted.  Trace pelvic free fluid is likely physiologic.  Sigmoid colonic wall thickness is at upper limits of normal but likely due to collapse and wall acquisition.  No small bowel wall thickening or focal segmental dilatation.  Appendix is normal, image 55.  No pelvic lymphadenopathy.  No acute osseous abnormality.  IMPRESSION: No acute intra-abdominal or pelvic pathology.   Original Report Authenticated By: Christiana Pellant, M.D.     1. Abdominal pain   2. Bacterial vaginosis   3. Trichomonas        MDM  No acute abdomen. CT scan shows no acute pathology. Will treat with pain medication, nausea medication, and Flagyl for her Trichomonas and bacterial vaginosis.  Recommended urological and gynecological followup        Donnetta Hutching, MD 03/28/13 2206

## 2013-03-28 NOTE — ED Notes (Signed)
Pt reports return of pain.  Dr. Adriana Simas made aware.

## 2013-03-28 NOTE — ED Notes (Signed)
Patient transported to CT 

## 2013-03-28 NOTE — ED Notes (Signed)
Oral contrast present for pt to drink

## 2013-03-28 NOTE — ED Notes (Signed)
Pt states that she has had conststant abd pain denies any urianry sx, no vag bleeding, has a hx of cycst. No n/v

## 2013-04-03 ENCOUNTER — Ambulatory Visit: Payer: No Typology Code available for payment source | Admitting: Rehabilitation

## 2013-04-04 ENCOUNTER — Emergency Department (HOSPITAL_COMMUNITY)
Admission: EM | Admit: 2013-04-04 | Discharge: 2013-04-05 | Disposition: A | Discharge status: Other Acute Inpatient Hospital | Payer: No Typology Code available for payment source | Attending: Emergency Medicine | Admitting: Emergency Medicine

## 2013-04-04 ENCOUNTER — Encounter (HOSPITAL_COMMUNITY): Payer: Self-pay | Admitting: Emergency Medicine

## 2013-04-04 DIAGNOSIS — F411 Generalized anxiety disorder: Secondary | ICD-10-CM | POA: Insufficient documentation

## 2013-04-04 DIAGNOSIS — F4310 Post-traumatic stress disorder, unspecified: Secondary | ICD-10-CM | POA: Insufficient documentation

## 2013-04-04 DIAGNOSIS — M25519 Pain in unspecified shoulder: Secondary | ICD-10-CM | POA: Insufficient documentation

## 2013-04-04 DIAGNOSIS — Z8742 Personal history of other diseases of the female genital tract: Secondary | ICD-10-CM | POA: Insufficient documentation

## 2013-04-04 DIAGNOSIS — F3289 Other specified depressive episodes: Secondary | ICD-10-CM | POA: Insufficient documentation

## 2013-04-04 DIAGNOSIS — F909 Attention-deficit hyperactivity disorder, unspecified type: Secondary | ICD-10-CM | POA: Insufficient documentation

## 2013-04-04 DIAGNOSIS — G8929 Other chronic pain: Secondary | ICD-10-CM | POA: Insufficient documentation

## 2013-04-04 DIAGNOSIS — R109 Unspecified abdominal pain: Secondary | ICD-10-CM | POA: Insufficient documentation

## 2013-04-04 DIAGNOSIS — F329 Major depressive disorder, single episode, unspecified: Secondary | ICD-10-CM | POA: Insufficient documentation

## 2013-04-04 DIAGNOSIS — G8928 Other chronic postprocedural pain: Secondary | ICD-10-CM | POA: Insufficient documentation

## 2013-04-04 DIAGNOSIS — Z79899 Other long term (current) drug therapy: Secondary | ICD-10-CM | POA: Insufficient documentation

## 2013-04-04 DIAGNOSIS — F17200 Nicotine dependence, unspecified, uncomplicated: Secondary | ICD-10-CM | POA: Insufficient documentation

## 2013-04-04 DIAGNOSIS — F39 Unspecified mood [affective] disorder: Secondary | ICD-10-CM | POA: Insufficient documentation

## 2013-04-04 DIAGNOSIS — Z8619 Personal history of other infectious and parasitic diseases: Secondary | ICD-10-CM | POA: Insufficient documentation

## 2013-04-04 DIAGNOSIS — R45851 Suicidal ideations: Secondary | ICD-10-CM | POA: Insufficient documentation

## 2013-04-04 DIAGNOSIS — Z90710 Acquired absence of both cervix and uterus: Secondary | ICD-10-CM | POA: Insufficient documentation

## 2013-04-04 LAB — URINALYSIS, ROUTINE W REFLEX MICROSCOPIC
Glucose, UA: NEGATIVE mg/dL
Ketones, ur: NEGATIVE mg/dL
Specific Gravity, Urine: 1.024 (ref 1.005–1.030)
pH: 6 (ref 5.0–8.0)

## 2013-04-04 LAB — COMPREHENSIVE METABOLIC PANEL
Alkaline Phosphatase: 69 U/L (ref 39–117)
BUN: 10 mg/dL (ref 6–23)
Calcium: 8.9 mg/dL (ref 8.4–10.5)
GFR calc Af Amer: >90 mL/min (ref >90)
GFR calc non Af Amer: >90 mL/min (ref >90)
Glucose, Bld: 108 mg/dL — ABNORMAL HIGH (ref 70–99)
Total Protein: 7.2 g/dL (ref 6.0–8.3)

## 2013-04-04 LAB — CBC
HCT: 41.2 % (ref 36.0–46.0)
Hemoglobin: 14.5 g/dL (ref 12.0–15.0)
MCH: 31 pg (ref 26.0–34.0)
MCHC: 35.2 g/dL (ref 30.0–36.0)

## 2013-04-04 LAB — RAPID URINE DRUG SCREEN, HOSP PERFORMED
Benzodiazepines: NOT DETECTED
Cocaine: POSITIVE — AB

## 2013-04-04 LAB — URINE MICROSCOPIC-ADD ON

## 2013-04-04 LAB — ETHANOL: Alcohol, Ethyl (B): <11 mg/dL (ref 0–11)

## 2013-04-04 MED ORDER — TRAZODONE HCL 50 MG PO TABS
50.0000 mg | ORAL_TABLET | Freq: Every evening | ORAL | Status: DC | PRN
  Administered 2013-04-04: 50 mg via ORAL
  Filled 2013-04-04: qty 1

## 2013-04-04 MED ORDER — ALPRAZOLAM 1 MG PO TABS
1.0000 mg | ORAL_TABLET | Freq: Three times a day (TID) | ORAL | Status: DC | PRN
  Administered 2013-04-04 – 2013-04-05 (×3): 1 mg via ORAL
  Filled 2013-04-04 (×3): qty 1

## 2013-04-04 MED ORDER — SERTRALINE HCL 50 MG PO TABS
200.0000 mg | ORAL_TABLET | Freq: Every day | ORAL | Status: DC
  Administered 2013-04-04: 200 mg via ORAL
  Filled 2013-04-04: qty 4

## 2013-04-04 NOTE — ED Notes (Signed)
Pt requesting medication to help her sleep. Pt states she takes Lunesta 3 mg at night. PA notified

## 2013-04-04 NOTE — ED Provider Notes (Signed)
History     CSN: [id number]  Arrival date & time 04/04/13  0907   First MD Initiated Contact with Patient 04/04/13 [phone]      Chief Complaint  Patient presents with  . Medical Clearance    (Consider location/radiation/quality/duration/timing/severity/associated sxs/prior treatment) The history is provided by the patient.   patient presents with suicidal thoughts. She's been off her Zoloft and Ativan for the last 2 weeks. She states over the last few days she's been having suicidal thoughts of cutting her wrists. She states she's been anxious. She has a previous psychiatric admissions in the last 10 years. She has a previous suicide attempt. She denies substance abuse but smokes 5 cigarettes a day. She states she has stopped her medications she did want to be on them anymore. She has done this previously and has had relapses at that time. He states he has some chronic shoulder pain after surgery. No dysuria. No cough. Mild chronic abdominal pain. No fevers. No hallucinations.  Past Medical History  Diagnosis Date  . PID (acute pelvic inflammatory disease)   . Chlamydia   . Depression   . PTSD (post-traumatic stress disorder)   . ADD (attention deficit disorder with hyperactivity)   . Bilateral ovarian cysts     Past Surgical History  Procedure Laterality Date  . Back surgery      dbl lumbar fusion L5-S1  . Bladder surgery    . Abdominal hysterectomy    . Laparoscopy      History reviewed. No pertinent family history.  History  Substance Use Topics  . Smoking status: Current Every Day Smoker -- 0.25 packs/day  . Smokeless tobacco: Not on file  . Alcohol Use: No    OB History   Grav Para Term Preterm Abortions TAB SAB Ect Mult Living                  Review of Systems  Constitutional: Negative for activity change and appetite change.  HENT: Negative for neck stiffness.   Eyes: Negative for pain.  Respiratory: Negative for chest tightness and shortness of breath.    Cardiovascular: Negative for chest pain and leg swelling.  Gastrointestinal: Positive for abdominal pain. Negative for nausea, vomiting and diarrhea.  Genitourinary: Negative for flank pain.  Musculoskeletal: Negative for back pain.  Skin: Negative for rash.  Neurological: Negative for weakness, numbness and headaches.  Psychiatric/Behavioral: Positive for suicidal ideas and dysphoric mood. Negative for behavioral problems.    Allergies  Imitrex; Clindamycin/lincomycin; Morphine and related; and Vicodin  Home Medications   Current Outpatient Rx  Name  Route  Sig  Dispense  Refill  . ALPRAZolam (XANAX) 1 MG tablet   Oral   Take 1 mg by mouth 3 (three) times daily as needed. anxiety         . amphetamine-dextroamphetamine (ADDERALL) 20 MG tablet   Oral   Take 20 mg by mouth 3 (three) times daily as needed. For alertness.         . sertraline (ZOLOFT) 100 MG tablet   Oral   Take 200 mg by mouth daily.             BP 116/79  Pulse 75  Temp(Src) 98.5 F (36.9 C) (Oral)  Resp 18  SpO2 99%  Physical Exam  Nursing note and vitals reviewed. Constitutional: She is oriented to person, place, and time. She appears well-developed and well-nourished.  HENT:  Head: Normocephalic and atraumatic.  Eyes: EOM are normal. Pupils are  equal, round, and reactive to light.  Neck: Normal range of motion. Neck supple.  Cardiovascular: Normal rate, regular rhythm and normal heart sounds.   No murmur heard. Pulmonary/Chest: Effort normal and breath sounds normal. No respiratory distress. She has no wheezes. She has no rales.  Abdominal: Soft. Bowel sounds are normal. She exhibits no distension. There is no tenderness. There is no rebound and no guarding.  Musculoskeletal: Normal range of motion.  Neurological: She is alert and oriented to person, place, and time. No cranial nerve deficit.  Skin: Skin is warm and dry.  Psychiatric: Her speech is normal.   Patient appears depressed     ED Course  Procedures (including critical care time)  Labs Reviewed  COMPREHENSIVE METABOLIC PANEL - Abnormal; Notable for the following:    Potassium 3.2 (*)    Glucose, Bld 108 (*)    All other components within normal limits  URINE RAPID DRUG SCREEN (HOSP PERFORMED) - Abnormal; Notable for the following:    Cocaine POSITIVE (*)    All other components within normal limits  URINALYSIS, ROUTINE W REFLEX MICROSCOPIC - Abnormal; Notable for the following:    APPearance CLOUDY (*)    Hgb urine dipstick MODERATE (*)    Leukocytes, UA MODERATE (*)    All other components within normal limits  URINE MICROSCOPIC-ADD ON - Abnormal; Notable for the following:    Squamous Epithelial / LPF MANY (*)    All other components within normal limits  CBC  ETHANOL   No results found.   1. Suicidal thoughts       MDM  Patient was suicidal thoughts. Appears to medically cleared at this time. Patient has cocaine positive in her urine but denied substance abuse. She appears medically cleared he'll be seen by ACT team        Juliet Rude. Rubin Payor, MD 04/04/13 [phone]

## 2013-04-04 NOTE — ED Notes (Signed)
Pt. C/o increased anxiety, states that she knows she is safe here, feeling anxious.
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
## 2013-04-04 NOTE — Consult Note (Signed)
Reason for Consult:Eval for IP psychiatric admission Referring Physician: [facility] EDP  [name] is an 47 y.o. female.  HPI: [name] is a 47 y/o WF with long standing hx of mood d/o, non compliant with psychotropic medications x 2 weeks duration with concerns with acutely exacerbated depressive sx to include racing thoughts, helplessness, hopelessness, decreased concentration and inability to sleep. [name] endorses her depressive sx being acutely exacerbated due to non compliance with Rx psychotropics.  The [name] rates her depressive sx 10/10 at present. SI ideations include cutting her wrist. [name] denies any HI or AVH. [name] also has Hx of PTSD but denies any flashbacks at this time. [name] endorses use of illicit drugs i.e. Cocaine. [name] states that she cannot contract for safety at this time.  Past Medical History  Diagnosis Date  . PID (acute pelvic inflammatory disease)   . Chlamydia   . Depression   . PTSD (post-traumatic stress disorder)   . ADD (attention deficit disorder with hyperactivity)   . Bilateral ovarian cysts     Past Surgical History  Procedure Laterality Date  . Back surgery      dbl lumbar fusion L5-S1  . Bladder surgery    . Abdominal hysterectomy    . Laparoscopy      History reviewed. No pertinent family history.  Social History:  reports that she has been smoking.  She does not have any smokeless tobacco history on file. She reports that she uses illicit drugs (Cocaine). She reports that she does not drink alcohol.  Allergies:  Allergies  Allergen Reactions  . Imitrex (Sumatriptan Base) Shortness Of Breath  . Clindamycin/Lincomycin Diarrhea    Violently sick-projectile vomiting  . Morphine And Related Nausea And Vomiting  . Vicodin (Hydrocodone-Acetaminophen) Nausea And Vomiting    Medications: I have reviewed the patient's current medications.  Results for orders placed during the hospital encounter of 04/04/13 (from the past 48 hour(s))  CBC     Status: None   Collection Time    04/04/13  9:12 AM      Result Value Range   WBC 7.9  4.0 - 10.5 K/uL   RBC 4.68  3.87 - 5.11 MIL/uL   Hemoglobin 14.5  12.0 - 15.0 g/dL   HCT 16.1  09.6 - 04.5 %   MCV 88.0  78.0 - 100.0 fL   MCH 31.0  26.0 - 34.0 pg   MCHC 35.2  30.0 - 36.0 g/dL   RDW 40.9  81.1 - 91.4 %   Platelets 369  150 - 400 K/uL  COMPREHENSIVE METABOLIC PANEL     Status: Abnormal   Collection Time    04/04/13  9:12 AM      Result Value Range   Sodium 135  135 - 145 mEq/L   Potassium 3.2 (*) 3.5 - 5.1 mEq/L   Chloride 100  96 - 112 mEq/L   CO2 25  19 - 32 mEq/L   Glucose, Bld 108 (*) 70 - 99 mg/dL   BUN 10  6 - 23 mg/dL   Creatinine, Ser 7.82  0.50 - 1.10 mg/dL   Calcium 8.9  8.4 - 95.6 mg/dL   Total Protein 7.2  6.0 - 8.3 g/dL   Albumin 3.6  3.5 - 5.2 g/dL   AST 11  0 - 37 U/L   ALT 9  0 - 35 U/L   Alkaline Phosphatase 69  39 - 117 U/L   Total Bilirubin 0.4  0.3 - 1.2 mg/dL  GFR calc non Af Amer >90  >90 mL/min   GFR calc Af Amer >90  >90 mL/min   Comment:            The eGFR has been calculated     using the CKD EPI equation.     This calculation has not been     validated in all clinical     situations.     eGFR's persistently     <90 mL/min signify     possible Chronic Kidney Disease.  ETHANOL     Status: None   Collection Time    04/04/13  9:12 AM      Result Value Range   Alcohol, Ethyl (B) <11  0 - 11 mg/dL   Comment:            LOWEST DETECTABLE LIMIT FOR     SERUM ALCOHOL IS 11 mg/dL     FOR MEDICAL PURPOSES ONLY  URINE RAPID DRUG SCREEN (HOSP PERFORMED)     Status: Abnormal   Collection Time    04/04/13  9:39 AM      Result Value Range   Opiates NONE DETECTED  NONE DETECTED   Cocaine POSITIVE (*) NONE DETECTED   Benzodiazepines NONE DETECTED  NONE DETECTED   Amphetamines NONE DETECTED  NONE DETECTED   Tetrahydrocannabinol NONE DETECTED  NONE DETECTED   Barbiturates NONE DETECTED  NONE DETECTED   Comment:            DRUG SCREEN FOR MEDICAL PURPOSES      ONLY.  IF CONFIRMATION IS NEEDED     FOR ANY PURPOSE, NOTIFY LAB     WITHIN 5 DAYS.                LOWEST DETECTABLE LIMITS     FOR URINE DRUG SCREEN     Drug Class       Cutoff (ng/mL)     Amphetamine      1000     Barbiturate      200     Benzodiazepine   200     Tricyclics       300     Opiates          300     Cocaine          300     THC              50  URINALYSIS, ROUTINE W REFLEX MICROSCOPIC     Status: Abnormal   Collection Time    04/04/13  9:39 AM      Result Value Range   Color, Urine YELLOW  YELLOW   APPearance CLOUDY (*) CLEAR   Specific Gravity, Urine 1.024  1.005 - 1.030   pH 6.0  5.0 - 8.0   Glucose, UA NEGATIVE  NEGATIVE mg/dL   Hgb urine dipstick MODERATE (*) NEGATIVE   Bilirubin Urine NEGATIVE  NEGATIVE   Ketones, ur NEGATIVE  NEGATIVE mg/dL   Protein, ur NEGATIVE  NEGATIVE mg/dL   Urobilinogen, UA 0.2  0.0 - 1.0 mg/dL   Nitrite NEGATIVE  NEGATIVE   Leukocytes, UA MODERATE (*) NEGATIVE  URINE MICROSCOPIC-ADD ON     Status: Abnormal   Collection Time    04/04/13  9:39 AM      Result Value Range   Squamous Epithelial / LPF MANY (*) RARE   WBC, UA 3-6  <3 WBC/hpf   RBC / HPF 3-6  <3 RBC/hpf  Urine-Other MUCOUS PRESENT     Comment: TRICHOMONAS PRESENT    No results found.  Review of Systems  Constitutional: Positive for malaise/fatigue. Negative for fever and chills.  HENT: Negative.   Eyes: Negative.   Respiratory: Negative.   Cardiovascular: Negative.   Gastrointestinal: Negative.   Genitourinary: Negative.   Musculoskeletal: Negative.   Skin:       H/o of cutting  Neurological: Positive for weakness. Negative for focal weakness, seizures and loss of consciousness.  Endo/Heme/Allergies: Negative.   Psychiatric/Behavioral: Positive for depression, suicidal ideas and memory loss. Negative for hallucinations and substance abuse. The patient is nervous/anxious and has insomnia.    Blood pressure 104/63, pulse 61, temperature 98.5 F (36.9 C),  temperature source Oral, resp. rate 18, SpO2 95.00%. Physical Exam  Vitals reviewed. Constitutional: She is oriented to person, place, and time. She appears well-developed and well-nourished.  HENT:  Head: Normocephalic.  Eyes: Pupils are equal, round, and reactive to light.  Neck: Neck supple. No JVD present. No thyromegaly present.  Cardiovascular: Normal rate.   Respiratory: Effort normal.  GI: Soft.  Genitourinary:  exam deferred  Musculoskeletal: Normal range of motion.  Neurological: She is alert and oriented to person, place, and time. No cranial nerve deficit.  Skin: Skin is warm and dry.  Psychiatric:  Depressed , sad affect with poor eye contact. Patient endorses SI but denies HI or AVH    Assessment/Plan: 1) Patient medically cleared for admission to [facility] for crises mgmt, safety and stabilization 2) Restart psychotropic Rx to reduce current sx to baseline and decrease chance for relapse 3) Intensive IP psycho therapy 4) Social work to fasciliate support services aid in OP stabilization and prevent further frequent IP readmissions 5) mgmt of co morbid conditions  Carlena Ruybal E 04/04/2013, 11:21 PM

## 2013-04-04 NOTE — ED Notes (Signed)
Pt states that she has been off her zoloft for about 2 wks.  States that she was feeling better and thought she could go without her meds.  Now states that she is suicidal with a plan to cut her wrists.  Denies HI.

## 2013-04-05 ENCOUNTER — Inpatient Hospital Stay (HOSPITAL_COMMUNITY)
Admission: EM | Admit: 2013-04-05 | Discharge: 2013-04-11 | DRG: 885 | Disposition: A | Discharge status: Home / Self Care | Payer: No Typology Code available for payment source | Source: Intra-hospital | Attending: Psychiatry | Admitting: Psychiatry

## 2013-04-05 ENCOUNTER — Encounter (HOSPITAL_COMMUNITY): Payer: Self-pay

## 2013-04-05 ENCOUNTER — Ambulatory Visit: Payer: No Typology Code available for payment source | Attending: Orthopedic Surgery | Admitting: Rehabilitation

## 2013-04-05 DIAGNOSIS — R45851 Suicidal ideations: Secondary | ICD-10-CM

## 2013-04-05 DIAGNOSIS — M25519 Pain in unspecified shoulder: Secondary | ICD-10-CM | POA: Insufficient documentation

## 2013-04-05 DIAGNOSIS — F909 Attention-deficit hyperactivity disorder, unspecified type: Secondary | ICD-10-CM | POA: Diagnosis present

## 2013-04-05 DIAGNOSIS — N83209 Unspecified ovarian cyst, unspecified side: Secondary | ICD-10-CM | POA: Diagnosis present

## 2013-04-05 DIAGNOSIS — R293 Abnormal posture: Secondary | ICD-10-CM | POA: Insufficient documentation

## 2013-04-05 DIAGNOSIS — IMO0001 Reserved for inherently not codable concepts without codable children: Secondary | ICD-10-CM | POA: Insufficient documentation

## 2013-04-05 DIAGNOSIS — Z79899 Other long term (current) drug therapy: Secondary | ICD-10-CM

## 2013-04-05 DIAGNOSIS — F4310 Post-traumatic stress disorder, unspecified: Secondary | ICD-10-CM | POA: Diagnosis present

## 2013-04-05 DIAGNOSIS — F332 Major depressive disorder, recurrent severe without psychotic features: Principal | ICD-10-CM | POA: Diagnosis present

## 2013-04-05 DIAGNOSIS — G8929 Other chronic pain: Secondary | ICD-10-CM

## 2013-04-05 MED ORDER — MAGNESIUM HYDROXIDE 400 MG/5ML PO SUSP: 30.0000 mL | Freq: Every day | ORAL | Status: DC | PRN

## 2013-04-05 MED ORDER — NICOTINE 14 MG/24HR TD PT24
14.0000 mg | MEDICATED_PATCH | Freq: Every day | TRANSDERMAL | Status: DC
  Administered 2013-04-05 – 2013-04-11 (×7): 14 mg via TRANSDERMAL
  Filled 2013-04-05 (×10): qty 1

## 2013-04-05 MED ORDER — CHLORDIAZEPOXIDE HCL 25 MG PO CAPS
25.0000 mg | ORAL_CAPSULE | Freq: Four times a day (QID) | ORAL | Status: DC | PRN
  Administered 2013-04-05 – 2013-04-11 (×10): 25 mg via ORAL
  Filled 2013-04-05 (×10): qty 1

## 2013-04-05 MED ORDER — IBUPROFEN 600 MG PO TABS
600.0000 mg | ORAL_TABLET | Freq: Four times a day (QID) | ORAL | Status: DC | PRN
  Administered 2013-04-05 – 2013-04-11 (×10): 600 mg via ORAL
  Filled 2013-04-05 (×10): qty 1

## 2013-04-05 MED ORDER — ALUM & MAG HYDROXIDE-SIMETH 200-200-20 MG/5ML PO SUSP: 30.0000 mL | ORAL | Status: DC | PRN

## 2013-04-05 MED ORDER — TRAZODONE HCL 50 MG PO TABS
50.0000 mg | ORAL_TABLET | Freq: Every evening | ORAL | Status: DC | PRN
  Administered 2013-04-05 – 2013-04-07 (×5): 50 mg via ORAL
  Filled 2013-04-05 (×11): qty 1

## 2013-04-05 MED ORDER — OXYCODONE-ACETAMINOPHEN 5-325 MG PO TABS
1.0000 | ORAL_TABLET | Freq: Four times a day (QID) | ORAL | Status: DC | PRN
  Administered 2013-04-05 – 2013-04-07 (×9): 1 via ORAL
  Filled 2013-04-05 (×9): qty 1

## 2013-04-05 MED ORDER — CIPROFLOXACIN HCL 250 MG PO TABS
250.0000 mg | ORAL_TABLET | Freq: Two times a day (BID) | ORAL | Status: AC
  Administered 2013-04-05 – 2013-04-08 (×6): 250 mg via ORAL
  Filled 2013-04-05 (×9): qty 1

## 2013-04-05 MED ORDER — POTASSIUM CHLORIDE CRYS ER 20 MEQ PO TBCR
40.0000 meq | EXTENDED_RELEASE_TABLET | Freq: Once | ORAL | Status: AC
  Administered 2013-04-05: 40 meq via ORAL
  Filled 2013-04-05: qty 2

## 2013-04-05 MED ORDER — POTASSIUM CHLORIDE CRYS ER 20 MEQ PO TBCR
20.0000 meq | EXTENDED_RELEASE_TABLET | Freq: Two times a day (BID) | ORAL | Status: AC
  Administered 2013-04-05 – 2013-04-06 (×3): 20 meq via ORAL
  Filled 2013-04-05 (×4): qty 1

## 2013-04-05 MED ORDER — SERTRALINE HCL 100 MG PO TABS
200.0000 mg | ORAL_TABLET | Freq: Every day | ORAL | Status: DC
  Administered 2013-04-05 – 2013-04-11 (×7): 200 mg via ORAL
  Filled 2013-04-05: qty 8
  Filled 2013-04-05 (×6): qty 2
  Filled 2013-04-05 (×2): qty 8
  Filled 2013-04-05 (×2): qty 2

## 2013-04-05 NOTE — [facility] Group Notes (Signed)
[facility] LCSW Group Therapy  Emotional Regulation 1:15 - 2: 30 PM        04/05/2013  1:18 PM   Type of Therapy:  Group Therapy  Participation Level:  Patient did not attend group. Wynn Banker 04/05/2013 1:18 PM

## 2013-04-05 NOTE — Progress Notes (Signed)
Patient ID: [name], female   DOB: 10-25-1966, 47 y.o.   MRN: [id number] D: Patient rated depression as 7 and anxiety as 7 on a 0-10 scale. Pt stated she stopped taking her medication because she felt better but now realiaze she needs to continue her medications even when feeling better.  Pt endorses passive suicidal ideation but contracts for safety. Pt attended evening wrap up group and engaged in discussions. Pt denies any needs or concerns.  Cooperative with assessment. No acute distressed noted at this time.   A: Met with pt 1:1. Medications administered as prescribed. Writer encouraged pt to discuss feelings. Pt encouraged to come to staff with any question or concerns. 15 minutes checks for safety.  R: Patient remains safe. She is complaint with medications and denies any adverse reaction. Continue current POC.

## 2013-04-05 NOTE — Tx Team (Signed)
Initial Interdisciplinary Treatment Plan  PATIENT STRENGTHS: (choose at least two) Ability for insight Motivation for treatment/growth Supportive family/friends Work skills  PATIENT STRESSORS: Loss of mother - April 22 2002 Medication change or noncompliance   PROBLEM LIST: Problem List/Patient Goals Date to be addressed Date deferred Reason deferred Estimated date of resolution  Depression 04/05/2013     Non compliant with medications for 2 weeks      SI            Goal - Patient would like to feel better, get back on her medications and get rid of her suicidal thoughts and continue with physical therapy                               DISCHARGE CRITERIA:  Ability to meet basic life and health needs Adequate post-discharge living arrangements Improved stabilization in mood, thinking, and/or behavior Need for constant or close observation no longer present Safe-care adequate arrangements made  PRELIMINARY DISCHARGE PLAN: Return to previous living arrangementINVOLVEMENT: This treatment plan has been presented to and reviewed with the patient, [name], and/or family member.  The patient and family have been given the opportunity to ask questions and make suggestions.  [name] 04/05/2013, 6:27 AM
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
## 2013-04-05 NOTE — ED Provider Notes (Signed)
Patient has been accepted at [facility] by Dr. Elsie Saas.   Dione Booze, MD 04/05/13 [id number]

## 2013-04-05 NOTE — Progress Notes (Signed)
Recreation Therapy Notes  ate: 06.11.2014  Time: 3:00pm  Location: [address] Day Room  Group Topic/Focus: Problem Solving, Communication, Team Work  Participation Level:  Did not attend.  Marykay Lex Datron Brakebill, LRT/CTRS  Jearl Klinefelter 04/05/2013 4:22 PM

## 2013-04-05 NOTE — Progress Notes (Signed)
Adult Psychoeducational Group Note  Date:  04/05/2013 Time:  8;00PM Group Topic/Focus:  Wrap-Up Group:   The focus of this group is to help patients review their daily goal of treatment and discuss progress on daily workbooks.  Participation Level:  Active  Participation Quality:  Appropriate and Attentive  Affect:  Appropriate  Cognitive:  Alert and Appropriate  Insight: Appropriate  Engagement in Group:  Engaged  Modes of Intervention:  Discussion  Additional Comments:  Pt. Was attentive and appropriate during tonight's group discussion. Pt. Stated that she didn't attend any groups today due to getting much needed rest. Pt stated she will attend all groups tomorrow and looking forward to getting the much needed help.   Bing Plume D 04/05/2013, 9:24 PM
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
## 2013-04-05 NOTE — [facility] Group Notes (Signed)
Adult Psychoeducational Group Note  Date:  04/05/2013 Time:  11:51 AM  Group Topic/Focus: Personal Goals and Values Personal Choices and Values:   The focus of this group is to help patients assess and explore the importance of values in their lives, how their values affect their decisions, how they express their values and what opposes their expression.  Participation Level:  Did Not Attend  Participation Quality:  Did not attend  Affect:  Did not attend  Cognitive:  None  Insight: None  Engagement in Group:  None  Modes of Intervention:  Activity and Discussion  Additional Comments:  [name] did not attend group.  [name] 04/05/2013, 11:51 AM
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
## 2013-04-05 NOTE — Progress Notes (Signed)
Patient admitted voluntarily for depression and SI with a plan to cut her wrist. Patient reports that she used cocaine on Sunday. Patient reports that the anniversary of her mother 's death is this month. Patient reports constant si and verbally contract for safety, deneis hi/a/v hallucinations. Patient oriented to unit, safety maintained with 15 min checks. Patient reports right rotator cuff surgery on 02/15/2013. During admission pt has bruise to her right inner thigh and an old surgery scar to her middle lower back.
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
## 2013-04-06 DIAGNOSIS — F332 Major depressive disorder, recurrent severe without psychotic features: Principal | ICD-10-CM

## 2013-04-06 NOTE — Progress Notes (Signed)
[facility] MD Progress Note  04/06/2013 2:25 PM [name]  MRN:  [id number] Subjective:  Patient is appeared lying in her bed with severe symptoms of depression 6/10, anxiety 8/10 and chronic depression. She has suicidal ideations without behaviors or gestures. She can contract for safety in hospital only at this time. She is not safe to be discharged prematurely today.  Diagnosis:  Axis I: Major Depression, Recurrent severe  ADL's:  Impaired  Sleep: Poor  Appetite:  Poor  Suicidal Ideation:  Plan:  YEA Intent:  YES Means:  NO  Homicidal Ideation:  denied AEB (as evidenced by):  Psychiatric Specialty Exam: ROS  Blood pressure 113/80, pulse 108, temperature 98 F (36.7 C), temperature source Oral, resp. rate 80, height 5\' 6"  (1.676 m), weight 106.142 kg (234 lb).Body mass index is 37.79 kg/(m^2).  General Appearance: Negative, Disheveled and Guarded  Eye Contact::  Minimal  Speech:  Clear and Coherent and Slow  Volume:  Decreased  Mood:  Anxious, Depressed, Dysphoric, Hopeless and Worthless  Affect:  Depressed and Flat  Thought Process:  Coherent and Intact  Orientation:  Full (Time, Place, and Person)  Thought Content:  Rumination  Suicidal Thoughts:  Yes.  with intent/plan  Homicidal Thoughts:  No  Memory:  Immediate;   Fair  Judgement:  Impaired  Insight:  Lacking  Psychomotor Activity:  Psychomotor Retardation  Concentration:  Fair  Recall:  Fair  Akathisia:  NA  Handed:  Right  AIMS (if indicated):     Assets:  Communication Skills Desire for Improvement Financial Resources/Insurance Social Support  Sleep:  Number of Hours: 4.5   Current Medications: Current Facility-Administered Medications  Medication Dose Route Frequency Provider Last Rate Last Dose  . alum & mag hydroxide-simeth (MAALOX/MYLANTA) 200-200-20 MG/5ML suspension 30 mL  30 mL Oral Q4H PRN Kerry Hough, PA-C      . chlordiazePOXIDE (LIBRIUM) capsule 25 mg  25 mg Oral QID PRN Kerry Hough, PA-C   25 mg at 04/06/13 8295  . ciprofloxacin (CIPRO) tablet 250 mg  250 mg Oral BID Fransisca Kaufmann, NP   250 mg at 04/06/13 0809  . ibuprofen (ADVIL,MOTRIN) tablet 600 mg  600 mg Oral Q6H PRN Kerry Hough, PA-C   600 mg at 04/06/13 1159  . magnesium hydroxide (MILK OF MAGNESIA) suspension 30 mL  30 mL Oral Daily PRN Kerry Hough, PA-C      . nicotine (NICODERM CQ - dosed in mg/24 hours) patch 14 mg  14 mg Transdermal Q0600 Kerry Hough, PA-C   14 mg at 04/06/13 6213  . oxyCODONE-acetaminophen (PERCOCET/ROXICET) 5-325 MG per tablet 1 tablet  1 tablet Oral Q6H PRN Fransisca Kaufmann, NP   1 tablet at 04/06/13 1222  . potassium chloride SA (K-DUR,KLOR-CON) CR tablet 20 mEq  20 mEq Oral BID Kerry Hough, PA-C   20 mEq at 04/06/13 0810  . sertraline (ZOLOFT) tablet 200 mg  200 mg Oral Daily Kerry Hough, PA-C   200 mg at 04/06/13 0810  . traZODone (DESYREL) tablet 50 mg  50 mg Oral QHS,MR X 1 Spencer E Simon, PA-C   50 mg at 04/06/13 0019    Lab Results: No results found for this or any previous visit (from the past 48 hour(s)).  Physical Findings: AIMS: Facial and Oral Movements Muscles of Facial Expression: None, normal Lips and Perioral Area: None, normal Jaw: None, normal Tongue: None, normal,Extremity Movements Upper (arms, wrists, hands, fingers): None, normal Lower (legs, knees,  ankles, toes): None, normal, Trunk Movements Neck, shoulders, hips: None, normal, Overall Severity Severity of abnormal movements (highest score from questions above): None, normal Incapacitation due to abnormal movements: None, normal Patient's awareness of abnormal movements (rate only patient's report): No Awareness, Dental Status Current problems with teeth and/or dentures?: No Does patient usually wear dentures?: No  CIWA:    COWS:     Treatment Plan Summary: Daily contact with patient to assess and evaluate symptoms and progress in treatment Medication management  Plan:  1. Provided  support for treatment 2. Encouraged to participate in milieu and group therapy 3. Continue 15 minute checks for safety 4. Continue Zoloft 200 mg daily which helped in the past and trazodone PRN for sleep and other prescribed medication  5. Case manager will contact with patient regarding disposition plans  Medical Decision Making Problem Points:  Established problem, worsening (2), Review of last therapy session (1) and Review of psycho-social stressors (1) Data Points:  Decision to obtain old records (1) Review or order clinical lab tests (1) Review of medication regiment & side effects (2) Review of new medications or change in dosage (2)  I certify that inpatient services furnished can reasonably be expected to improve the patient's condition.   [name],Virginia R. 04/06/2013, 2:25 PM

## 2013-04-06 NOTE — Progress Notes (Signed)
D:  Patient's self inventory sheet, patient has poor sleep, good appetite, low energy level, good attention span.  Rated depression #7, hopelessness #5.  Denied withdrawals.  Denied SI.  Has experienced pain today, pain goal #5, worst pain #8. After discharge, plans to stay on meds.  Has high anxiety.  Does have discharge plans.  No problems taking meds after discharge. A:  Medications administered per MD orders.  Emotional support and encouragement given patient. R:  Denied SI and HI.  Denied A/V hallucinations.   Will continue to monitor patient for safety with 15 minute checks.  Safety maintained.

## 2013-04-06 NOTE — [facility] Group Notes (Signed)
[facility] LCSW Group Therapy  [facility]  1:15 - 3: 30          04/06/2013  1:18 PM    Type of Therapy:  Group Therapy  Participation Level:  Appropriate  Participation Quality:  Appropriate  Affect:  Appropriate  Cognitive:  Attentive Appropriate  Insight:  Engaged  Engagement in Therapy:  Engaged  Modes of Intervention:  Discussion Exploration Problem-Solving Supportive  Summary of Progress/Problems: Patient listened attentively but made no comments on the presentation.   Wynn Banker 04/06/2013  1:18 PM

## 2013-04-07 MED ORDER — OXYCODONE-ACETAMINOPHEN 5-325 MG PO TABS
1.5000 | ORAL_TABLET | Freq: Four times a day (QID) | ORAL | Status: DC | PRN
  Administered 2013-04-07 – 2013-04-11 (×16): 1.5 via ORAL
  Filled 2013-04-07 (×15): qty 2

## 2013-04-07 NOTE — Plan of Care (Signed)
Problem: Ineffective individual coping Goal: STG: Patient will participate in after care plan Outcome: Completed/Met Date Met:  04/07/13 Patient is attending groups and easily engaged in discussion.   Her follow up is scheduled with [facility].  Horace Porteous Daesean Lazarz, LCSW 04/07/2013

## 2013-04-07 NOTE — Progress Notes (Signed)
Adult Psychoeducational Group Note  Date:  04/07/2013 Time:  12:44 PM  Group Topic/Focus:  Goals Group:   The focus of this group is to help patients establish daily goals to achieve during treatment and discuss how the patient can incorporate goal setting into their daily lives to aide in recovery.  Participation Level:  Active  Participation Quality:  Attentive and Sharing  Affect:  Appropriate  Cognitive:  Appropriate  Insight: Good  Engagement in Group:  Engaged and Supportive  Modes of Intervention:  Discussion, Socialization and Support  Additional Comments:  Kylan shared her story when asked and she has a plan stay on her medication and see her doctor. She expressed that she has a good support system with her boyfriend. She was given education pamphlets on support programs and wellness.    Cathlean Cower 04/07/2013, 12:44 PM

## 2013-04-07 NOTE — Progress Notes (Signed)
Adult Psychoeducational Group Note  Date:  04/07/2013 Time:  1:28 PM  Group Topic/Focus:  Relapse Prevention Planning:   The focus of this group is to define relapse and discuss the need for planning to combat relapse.  Participation Level:  Did Not Attend  Participation Quality:  Did not Attend  Affect:  Did not Attend  Cognitive:  Did not Attend  Insight: None  Engagement in Group:  Did not Attend  Modes of Intervention:  Did not Attend  Additional Comments:    Cathlean Cower 04/07/2013, 1:28 PM

## 2013-04-07 NOTE — Progress Notes (Signed)
[facility] MD Progress Note  04/07/2013 12:10 PM [name]  MRN:  [id number]  Subjective:  Patient complains of severe symptoms of depression 6/10, anxiety 8/10 and chronic back pain. She has continues to have to suicidal ideations without behaviors or gestures. She can contract for safety in hospital only at this time. She is not safe to be discharged prematurely.   Diagnosis:  Axis I: Major Depression, Recurrent severe  ADL's:  Impaired  Sleep: Poor  Appetite:  Poor  Suicidal Ideation:  Plan:  YEA Intent:  YES Means:  NO  Homicidal Ideation:  denied AEB (as evidenced by):  Psychiatric Specialty Exam: ROS  Blood pressure 90/61, pulse 64, temperature 97.9 F (36.6 C), temperature source Oral, resp. rate 20, height 5\' 6"  (1.676 m), weight 106.142 kg (234 lb).Body mass index is 37.79 kg/(m^2).  General Appearance: Negative, Disheveled and Guarded  Eye Contact::  Minimal  Speech:  Clear and Coherent and Slow  Volume:  Decreased  Mood:  Anxious, Depressed, Dysphoric, Hopeless and Worthless  Affect:  Depressed and Flat  Thought Process:  Coherent and Intact  Orientation:  Full (Time, Place, and Person)  Thought Content:  Rumination  Suicidal Thoughts:  Yes.  with intent/plan  Homicidal Thoughts:  No  Memory:  Immediate;   Fair  Judgement:  Impaired  Insight:  Lacking  Psychomotor Activity:  Psychomotor Retardation  Concentration:  Fair  Recall:  Fair  Akathisia:  NA  Handed:  Right  AIMS (if indicated):     Assets:  Communication Skills Desire for Improvement Financial Resources/Insurance Social Support  Sleep:  Number of Hours: 5   Current Medications: Current Facility-Administered Medications  Medication Dose Route Frequency Provider Last Rate Last Dose  . alum & mag hydroxide-simeth (MAALOX/MYLANTA) 200-200-20 MG/5ML suspension 30 mL  30 mL Oral Q4H PRN Kerry Hough, PA-C      . chlordiazePOXIDE (LIBRIUM) capsule 25 mg  25 mg Oral QID PRN Kerry Hough,  PA-C   25 mg at 04/07/13 0942  . ciprofloxacin (CIPRO) tablet 250 mg  250 mg Oral BID Fransisca Kaufmann, NP   250 mg at 04/07/13 9629  . ibuprofen (ADVIL,MOTRIN) tablet 600 mg  600 mg Oral Q6H PRN Kerry Hough, PA-C   600 mg at 04/07/13 1155  . magnesium hydroxide (MILK OF MAGNESIA) suspension 30 mL  30 mL Oral Daily PRN Kerry Hough, PA-C      . nicotine (NICODERM CQ - dosed in mg/24 hours) patch 14 mg  14 mg Transdermal Q0600 Kerry Hough, PA-C   14 mg at 04/07/13 5284  . oxyCODONE-acetaminophen (PERCOCET/ROXICET) 5-325 MG per tablet 1 tablet  1 tablet Oral Q6H PRN Fransisca Kaufmann, NP   1 tablet at 04/07/13 0626  . sertraline (ZOLOFT) tablet 200 mg  200 mg Oral Daily Kerry Hough, PA-C   200 mg at 04/07/13 1324  . traZODone (DESYREL) tablet 50 mg  50 mg Oral QHS,MR X 1 Spencer E Simon, PA-C   50 mg at 04/07/13 0024    Lab Results: No results found for this or any previous visit (from the past 48 hour(s)).  Physical Findings: AIMS: Facial and Oral Movements Muscles of Facial Expression: None, normal Lips and Perioral Area: None, normal Jaw: None, normal Tongue: None, normal,Extremity Movements Upper (arms, wrists, hands, fingers): None, normal Lower (legs, knees, ankles, toes): None, normal, Trunk Movements Neck, shoulders, hips: None, normal, Overall Severity Severity of abnormal movements (highest score from questions above): None, normal Incapacitation  due to abnormal movements: None, normal Patient's awareness of abnormal movements (rate only patient's report): No Awareness, Dental Status Current problems with teeth and/or dentures?: No Does patient usually wear dentures?: No  CIWA:  CIWA-Ar Total: 2 COWS:  COWS Total Score: 2  Treatment Plan Summary: Daily contact with patient to assess and evaluate symptoms and progress in treatment Medication management  Plan:  1. Provided support for treatment 2. Encouraged to participate in milieu and group therapy 3. Continue 15  minute checks for safety 4. Continue Zoloft 200 mg daily which helped in the past  5. Continue Trazodone PRN for sleep and other prescribed medication  6. Continue her pain medication as prescribed 7. Case manager will contact with patient regarding disposition plans  Medical Decision Making Problem Points:  Established problem, worsening (2), Review of last therapy session (1) and Review of psycho-social stressors (1) Data Points:  Decision to obtain old records (1) Review or order clinical lab tests (1) Review of medication regiment & side effects (2) Review of new medications or change in dosage (2)  I certify that inpatient services furnished can reasonably be expected to improve the patient's condition.   Hassen Bruun,JANARDHAHA R. 04/07/2013, 12:10 PM

## 2013-04-07 NOTE — Tx Team (Signed)
Interdisciplinary Treatment Plan Update   Date Reviewed:  04/07/2013  Time Reviewed:  9:42 AM  Progress in Treatment:   Attending groups: Yes Participating in groups: Yes Taking medication as prescribed: Yes  Tolerating medication: Yes Family/Significant other contact made:No, but will ask patient for consent for collateral contact Patient understands diagnosis: Yes  Discussing patient identified problems/goals with staff: Yes Medical problems stabilized or resolved: Yes Denies suicidal/homicidal ideation: Yes Patient has not harmed self or others: Yes  For review of initial/current patient goals, please see plan of care.  Estimated Length of Stay:  3-4 days  Reasons for Continued Hospitalization:  Anxiety Depression Medication stabilization  New Problems/Goals identified:    Discharge Plan or Barriers:   Home with outpatient follow up at [facility]  Additional Comments:   [name] is a 46 y.o. female who presents voluntarily to [facility] with SI/Depression. Pt is SI w/plan to cut wrists. Pt reports being off her medications for 2 wks--"I wanted to live my life without being on medications, it's going very well". Pt says she's been feeling SI x 24hrs and felt like she needed to come to the hospital. Pt has a substance abuse hx and UDS is + for Cocaine. Pt says she used Cocaine this past weekend( "20") but states hasn't used in 3 mos; reports intermittent sobriety and no inpt admissions for substance abuse.    Will continue to monitor for safety.  Attendees:  Patient:  04/07/2013 9:42 AM   Signature: Mervyn Gay, MD 04/07/2013 9:42 AM  Signature: 04/07/2013 9:42 AM  Signature: Harold Barban, RN 04/07/2013 9:42 AM  Signature:Beverly Terrilee Croak, RN 04/07/2013 9:42 AM  Signature:  04/07/2013 9:42 AM  Signature:  Juline Patch, LCSW 04/07/2013 9:42 AM  Signature:  Reyes Ivan, LCSW 04/07/2013 9:42 AM  Signature:  Maseta Dorley,Care Coordinator 04/07/2013 9:42 AM   Signature: 04/07/2013 9:42 AM  Signature:    Signature:    Signature:      Scribe for Treatment Team:   Juline Patch,  04/07/2013 9:42 AM

## 2013-04-07 NOTE — [facility] Group Notes (Signed)
[facility] LCSW Group Therapy  Feelings Around Relapse 1:15 -2:30        04/07/2013  12:53 PM   Type of Therapy:  Group Therapy  Participation Level:  Appropriate  Participation Quality:  Appropriate  Affect:  Appropriate  Cognitive:  Attentive Appropriate  Insight:  Engaged  Engagement in Therapy:  Engaged  Modes of Intervention:  Discussion Exploration Problem-Solving Supportive  Summary of Progress/Problems:  The topic for today was feelings around relapse.  Patient discussed what relapse prevention is to them and identified triggers that are on the patient to relapse.  Patient processed her feelings toward relapse and was able to related to peers. Patient identified coping skills that can be used to prevent a relapse.  Patient shared she has to accept she cannot stop taking her medications.     Wynn Banker 04/07/2013 12:53 PM

## 2013-04-07 NOTE — Progress Notes (Signed)
D:  Patient's self inventory sheet, needs sleep medication, has good appetite, low energy level, good attention span.  Rated depression #5, hopelessness #2, anxiety #7.  Denied withdrawals.  Denied SI.  Pain goal today #5, but it won't happen on what I'm prescribed.   Stay on meds after discharge.  "Im in pain and would like an increase on the dosage or be able to take every 4-5 hours."  No problems taking meds after discharge. A:  Medications administered per MD orders.  Emotional support and encouragement given patient. R:  Denied SI and HI.  Denied A/V hallucinations.  Will continue to monitor patient for safety every 15 minute checks.  Safety maintained.  Patient continually asking for medications for pain/anxiety.   Has told nurse several times that she needs her pain dose increased or to be able to take medications every 4 hours instead of every 6 hours.  Patient always stated her pain right shoulder is 8, and only goes down to #5 pain level after taking pain medication.  Discussed with MD.  Patient will discuss with her physican concerning her right shoulder surgery this year. Patient becomes agitated when nurse waits 6 hours to pull her pain medicine.   Stated other nurses pull her pain medicine earlier and why won't you pull my medicine earlier.

## 2013-04-07 NOTE — Progress Notes (Signed)
[facility] Group Notes:  (Nursing/MHT/Case Management/Adjunct)  Date:  04/07/2013  Time:  10:35 PM  Type of Therapy:  Group Therapy  Participation Level:  Minimal  Participation Quality:  Appropriate  Affect:  Appropriate and Flat  Cognitive:  Appropriate  Insight:  Limited  Engagement in Group:  Developing/Improving  Modes of Intervention:  Activity, Socialization and Support  Summary of Progress/Problems Pt. Came into group late.  Pt. Stated her early waring sign for relapse prevention was stopping her medication.  Sondra Come 04/07/2013, 10:35 PM

## 2013-04-07 NOTE — Progress Notes (Signed)
Patient ID: [name], female   DOB: November 30, 1965, 47 y.o.   MRN: [id number] D: Patient rated depression as 7 and anxiety as 7 on a 0-10 scale. Pt is exhibiting drug seeking behavior. Pt is taking her prn pain medication  q6h and pain level is never  below "7". Pt attended evening karaoke group and participated  in singing and dancing. Pt denies any other needs or concerns.  Cooperative with assessment. No acute distressed noted at this time.   A: Met with pt 1:1. Medications administered as prescribed. Writer encouraged pt to discuss feelings. Pt encouraged to come to staff with any question or concerns. 15 minutes checks for safety.  R: Patient remains safe. She is complaint with medications and denies any adverse reaction. Continue current POC.

## 2013-04-08 MED ORDER — TRAZODONE HCL 50 MG PO TABS
75.0000 mg | ORAL_TABLET | Freq: Every evening | ORAL | Status: DC | PRN
  Administered 2013-04-08: 75 mg via ORAL
  Filled 2013-04-08 (×6): qty 1

## 2013-04-08 NOTE — Progress Notes (Signed)
[facility] MD Progress Note  04/08/2013 6:02 PM EMYLIE AMSTER  MRN:  [id number] Subjective: Ms.  Oh is a 47 y/o woman with a past psychiatric history significant for Major Depressive Disorder.  She was admitted after discontinuing her medications for the 7th time.  She has been stable on Zoloft for years and states she has gained insight into the fact that she will require Zoloft indefinitely. The patient became suicidal after stopping her medications for 2 weeks.  The patient reports that her mother commited suicide and she would not want to do that to her daughter.  She states that she is very proud of her daughter.  She states she has been taking her medications daily and denies side effects.    Diagnosis: Axis I: Major Depression, Recurrent severe   ADL's:  Intact  Sleep: Poor  Appetite:  Fair  Suicidal Ideation:  Plan:  Patient denies  Intent:    Patient denies  Means:  Patient denies  Homicidal Ideation:  Plan:    Patient denies  Intent:    Patient denies  Means:    Patient denies  AEB (as evidenced by):  Psychiatric Specialty Exam: Review of Systems  Constitutional: Negative for fever, chills and weight loss.  Respiratory: Negative for cough, hemoptysis, sputum production, shortness of breath and wheezing.   Cardiovascular: Negative for chest pain and palpitations.  Gastrointestinal: Negative for heartburn, nausea, vomiting, abdominal pain, diarrhea and constipation.  Neurological: Negative for dizziness, tingling, tremors, sensory change, seizures and loss of consciousness.    Blood pressure 100/58, pulse 84, temperature 98.1 F (36.7 C), temperature source Oral, resp. rate 18, height 5\' 6"  (1.676 m), weight 106.142 kg (234 lb).Body mass index is 37.79 kg/(m^2).  General Appearance: Casual and Disheveled  Eye Contact::  Good  Speech:  Clear and Coherent  Volume:  Normal  Mood:  Anxious  Affect:  Appropriate, Congruent and Full Range  Thought Process:   Circumstantial, Linear and Logical  Orientation:  Full (Time, Place, and Person)  Thought Content:  WDL  Suicidal Thoughts:  No  Homicidal Thoughts:  No  Memory:  Immediate;   Good Recent;   Good Remote;   Good  Judgement:  Fair  Insight:  Shallow  Psychomotor Activity:  Normal  Concentration:  Good  Recall:  Fair  Akathisia:  Negative  Handed:  Left  AIMS (if indicated):   Not indicated  Assets:  Communication Skills Desire for Improvement Housing Social Support  Sleep:  Number of Hours: 4   Current Medications: Current Facility-Administered Medications  Medication Dose Route Frequency Provider Last Rate Last Dose  . alum & mag hydroxide-simeth (MAALOX/MYLANTA) 200-200-20 MG/5ML suspension 30 mL  30 mL Oral Q4H PRN Kerry Hough, PA-C      . chlordiazePOXIDE (LIBRIUM) capsule 25 mg  25 mg Oral QID PRN Kerry Hough, PA-C   25 mg at 04/07/13 2110  . ibuprofen (ADVIL,MOTRIN) tablet 600 mg  600 mg Oral Q6H PRN Kerry Hough, PA-C   600 mg at 04/08/13 1029  . magnesium hydroxide (MILK OF MAGNESIA) suspension 30 mL  30 mL Oral Daily PRN Kerry Hough, PA-C      . nicotine (NICODERM CQ - dosed in mg/24 hours) patch 14 mg  14 mg Transdermal Q0600 Kerry Hough, PA-C   14 mg at 04/08/13 5784  . oxyCODONE-acetaminophen (PERCOCET/ROXICET) 5-325 MG per tablet 1.5 tablet  1.5 tablet Oral Q6H PRN Fransisca Kaufmann, NP   1.5 tablet at 04/08/13 1213  .  sertraline (ZOLOFT) tablet 200 mg  200 mg Oral Daily Kerry Hough, PA-C   200 mg at 04/08/13 1610  . traZODone (DESYREL) tablet 50 mg  50 mg Oral QHS,MR X 1 Kerry Hough, PA-C   50 mg at 04/07/13 2110    Lab Results: No results found for this or any previous visit (from the past 48 hour(s)).  Physical Findings: AIMS: Facial and Oral Movements Muscles of Facial Expression: None, normal Lips and Perioral Area: None, normal Jaw: None, normal Tongue: None, normal,Extremity Movements Upper (arms, wrists, hands, fingers): None,  normal Lower (legs, knees, ankles, toes): None, normal, Trunk Movements Neck, shoulders, hips: None, normal, Overall Severity Severity of abnormal movements (highest score from questions above): None, normal Incapacitation due to abnormal movements: None, normal Patient's awareness of abnormal movements (rate only patient's report): No Awareness, Dental Status Current problems with teeth and/or dentures?: No Does patient usually wear dentures?: No  CIWA:  CIWA-Ar Total: 2 COWS:  COWS Total Score: 1  Treatment Plan Summary: Daily contact with patient to assess and evaluate symptoms and progress in treatment Medication management  Plan:  1. Provided support for treatment  2. Encouraged to participate in milieu and group therapy  3. Continue 15 minute checks for safety  4. Continue Zoloft 200 mg daily which helped in the past  5. Increase Trazodone 75 mg daily. Will consider further increase if needed. 6. Continue her pain medication as prescribed  7. Case manager will contact with patient regarding disposition plans   Medical Decision Making Problem Points:  Established problem, stable/improving (1), Review of last therapy session (1) and Review of psycho-social stressors (1) Data Points:  Review and summation of old records (2) Review of medication regiment & side effects (2)  I certify that inpatient services furnished can reasonably be expected to improve the patient's condition.   Keamber Macfadden 04/08/2013, 6:02 PM

## 2013-04-08 NOTE — Progress Notes (Signed)
Psychoeducational Group Note  Date: 04/08/2013 Time:  1015  Group Topic/Focus:  Identifying Needs:   The focus of this group is to help patients identify their personal needs that have been historically problematic and identify healthy behaviors to address their needs.  Participation Level:  Active  Participation Quality:  Appropriate  Affect:  Appropriate  Cognitive:  Appropriate  Insight:  Improving  Engagement in Group:  Improving  Additional Comments:  Pt participated in group.   Corin Formisano A  

## 2013-04-08 NOTE — [facility] Group Notes (Signed)
[facility] LCSW Group Therapy  Stages of Change 04/08/2013  1:40 PM    Type of Therapy:  Group Therapy  Participation Level:  Did not attend group.   Wynn Banker 04/08/2013  1:40 PM

## 2013-04-08 NOTE — Progress Notes (Signed)
D) Pt rates her pain at a 7 or 8. Will sit in the dayroom laughing and joking with her peers and unaware that she is being observed. When she sees she is being observed she will grab her right shoulder and rub it. Did attend the groups this morning but slept through groups this afternoon. Wanted to make sure that she got her pain medication before going to lunch because "I won't be able to make it through with out it" A) Praisied Pt when appropriate. Set boundaries with Pt. R) Pt rates her depression at a 4 and her  Hopelessness at a 2. Denies SI and HI.

## 2013-04-08 NOTE — Progress Notes (Signed)
.  Psychoeducational Group Note    Date: 04/08/2013 Time:  0915    Goal Setting Purpose of Group: To be able to set a goal that is measurable and that can be accomplished in one day Participation Level:  Active  Participation Quality:  Appropriate  Affect:  Appropriate  Cognitive:  Oriented  Insight:  Improving  Engagement in Group:  Engaged  Additional Comments:    [name] 

## 2013-04-08 NOTE — Progress Notes (Signed)
The focus of this group is to help patients review their daily goal of treatment and discuss progress on daily workbooks. Pt attended the evening group session and responded to all discussion prompts from the Writer. Pt reported having a very good day on account of her being happy and not having any SI or negative thoughts, which she attributes to being back on her medications. Pt shared that she had made several new friends on the hallway whom she leaned on for support. Pt's affect was very bright.

## 2013-04-08 NOTE — Progress Notes (Signed)
D: Patient resting in bed. Respirations even and unlabored. No apparent distress noted.  A: Monitoring pt Q15 min for safety  R: Patient remains safe on the unit. Will continue to monitor.  

## 2013-04-09 MED ORDER — TRAZODONE HCL 100 MG PO TABS
100.0000 mg | ORAL_TABLET | Freq: Every evening | ORAL | Status: DC | PRN
  Administered 2013-04-09 – 2013-04-10 (×2): 100 mg via ORAL
  Filled 2013-04-09: qty 4
  Filled 2013-04-09: qty 1
  Filled 2013-04-09: qty 4
  Filled 2013-04-09: qty 1

## 2013-04-09 MED ORDER — TRAZODONE HCL 100 MG PO TABS: 100.0000 mg | ORAL_TABLET | Freq: Every evening | ORAL | Status: DC | PRN

## 2013-04-09 NOTE — Progress Notes (Signed)
Pt with frequent medication needs this evening. Complaining of near constant pain though was able to sleep. Awoke and asked for percocet prn although she knew it was too soon to be given. Once it was time and was given to pt she was able to fall back to sleep soon thereafter. States today was a good day as she was free of SI. No HI/AVH. Pt is safe. [name]

## 2013-04-09 NOTE — Progress Notes (Signed)
Psychoeducational Group Note  Date:  04/09/2013 Time:  1015  Group Topic/Focus:  Making Healthy Choices:   The focus of this group is to help patients identify negative/unhealthy choices they were using prior to admission and identify positive/healthier coping strategies to replace them upon discharge.  Participation Level:  Stayed a short time and then left    Dione Housekeeper 04/09/2013

## 2013-04-09 NOTE — Progress Notes (Signed)
D) Pt requests her pain medication every 6 hours throughout the day. Will sit in the dayroom interacting with her peers and will laugh and joke.Marland Kitchen Has attended the groups and participated. Pt rates her depression at a 3 and her hopelessness  At a 1. denies SI and HI A) Given support and reassurance along with appropriate praise. Gently confronted about the pain medication. R) Denies SI and HI.

## 2013-04-09 NOTE — [facility] Group Notes (Signed)
[facility] LCSW Group Therapy  04/09/2013  10:00  Type of Therapy:  Group Therapy  Participation Level:  Was in group initially and identified boyfriend as main support.  Left and did not return.    Summary of Progress/Problems:   The main focus of today's process group was to identify the patient's current support system and decide on other supports that can be put in place. Four definitions/levels of support were discussed and an exercise was utilized to show how much stronger we become with additional supports. An emphasis was placed on using counselor, doctor, therapy groups, 12-step groups, and problem-specific support groups to expand supports, as well as doing something different than has been done before.   [place], LCSW 04/09/2013 3:15 PM

## 2013-04-09 NOTE — Progress Notes (Signed)
[facility] MD Progress Note  04/09/2013 4:23 PM [name]  MRN:  [id number] Subjective: Ms.  Kappes is a 47 y/o woman with a past psychiatric history significant for Major Depressive Disorder.  The patient reports that she has been doing well. She reports that she has been able to take part in groups.  She reports that she has learned that she needs to stay on her medications.  She has not had any disruptive behavior on the unit. She is taking her medications and denies any side effects.    Diagnosis: Axis I: Major Depression, Recurrent severe   ADL's:  Intact  Sleep: Poor-Improving  Appetite:  Fair  Suicidal Ideation:  Plan:  Patient denies  Intent:    Patient denies  Means:  Patient denies  Homicidal Ideation:  Plan:    Patient denies  Intent:    Patient denies  Means:    Patient denies  AEB (as evidenced by):  Psychiatric Specialty Exam: Review of Systems  Constitutional: Negative for fever, chills and weight loss.  Respiratory: Negative for cough, hemoptysis, sputum production, shortness of breath and wheezing.   Cardiovascular: Negative for chest pain and palpitations.  Gastrointestinal: Negative for heartburn, nausea, vomiting, abdominal pain, diarrhea and constipation.  Neurological: Negative for dizziness, tingling, tremors, sensory change, seizures and loss of consciousness.    Blood pressure 100/58, pulse 84, temperature 98.1 F (36.7 C), temperature source Oral, resp. rate 18, height 5\' 6"  (1.676 m), weight 106.142 kg (234 lb).Body mass index is 37.79 kg/(m^2).  General Appearance: Casual and Disheveled  Eye Contact::  Good  Speech:  Clear and Coherent  Volume:  Normal  Mood:  "good mood"  Affect:  Appropriate, Congruent and Full Range  Thought Process:  Circumstantial, Linear and Logical  Orientation:  Full (Time, Place, and Person)  Thought Content:  WDL  Suicidal Thoughts:  No  Homicidal Thoughts:  No  Memory:  Immediate;   Good Recent;   Good Remote;    Good  Judgement:  Fair  Insight:  Shallow  Psychomotor Activity:  Normal  Concentration:  Good  Recall:  Fair  Akathisia:  Negative  Handed:  Left  AIMS (if indicated):   Not indicated  Assets:  Communication Skills Desire for Improvement Housing Social Support  Sleep:  Number of Hours: 4.5   Current Medications: Current Facility-Administered Medications  Medication Dose Route Frequency Provider Last Rate Last Dose  . alum & mag hydroxide-simeth (MAALOX/MYLANTA) 200-200-20 MG/5ML suspension 30 mL  30 mL Oral Q4H PRN Kerry Hough, PA-C      . chlordiazePOXIDE (LIBRIUM) capsule 25 mg  25 mg Oral QID PRN Kerry Hough, PA-C   25 mg at 04/08/13 2055  . ibuprofen (ADVIL,MOTRIN) tablet 600 mg  600 mg Oral Q6H PRN Kerry Hough, PA-C   600 mg at 04/08/13 2054  . magnesium hydroxide (MILK OF MAGNESIA) suspension 30 mL  30 mL Oral Daily PRN Kerry Hough, PA-C      . nicotine (NICODERM CQ - dosed in mg/24 hours) patch 14 mg  14 mg Transdermal Q0600 Kerry Hough, PA-C   14 mg at 04/09/13 0850  . oxyCODONE-acetaminophen (PERCOCET/ROXICET) 5-325 MG per tablet 1.5 tablet  1.5 tablet Oral Q6H PRN Fransisca Kaufmann, NP   1.5 tablet at 04/09/13 1234  . sertraline (ZOLOFT) tablet 200 mg  200 mg Oral Daily Kerry Hough, PA-C   200 mg at 04/09/13 0850  . traZODone (DESYREL) tablet 75 mg  75 mg  Oral QHS,MR X 1 Larena Sox, MD   75 mg at 04/08/13 2054    Lab Results: No results found for this or any previous visit (from the past 48 hour(s)).  Physical Findings: AIMS: Facial and Oral Movements Muscles of Facial Expression: None, normal Lips and Perioral Area: None, normal Jaw: None, normal Tongue: None, normal,Extremity Movements Upper (arms, wrists, hands, fingers): None, normal Lower (legs, knees, ankles, toes): None, normal, Trunk Movements Neck, shoulders, hips: None, normal, Overall Severity Severity of abnormal movements (highest score from questions above): None,  normal Incapacitation due to abnormal movements: None, normal Patient's awareness of abnormal movements (rate only patient's report): No Awareness, Dental Status Current problems with teeth and/or dentures?: No Does patient usually wear dentures?: No  CIWA:  CIWA-Ar Total: 2 COWS:  COWS Total Score: 1  Treatment Plan Summary: Daily contact with patient to assess and evaluate symptoms and progress in treatment Medication management  Plan:  1. Provided support for treatment  2. Encouraged to participate in milieu and group therapy  3. Continue 15 minute checks for safety  4. Continue Zoloft 200 mg daily which helped in the past  5. Increase Trazodone 100 mg daily. Will consider further increase if needed. 6. Continue her pain medication as prescribed  7. Case manager will contact with patient regarding disposition plans   Medical Decision Making Problem Points:  Established problem, stable/improving (1), Review of last therapy session (1) and Review of psycho-social stressors (1) Data Points:  Review and summation of old records (2) Review of medication regiment & side effects (2)  I certify that inpatient services furnished can reasonably be expected to improve the patient's condition.   Jerral Mccauley 04/09/2013, 4:23 PM

## 2013-04-09 NOTE — Progress Notes (Signed)
Psychoeducational Group Note  Psychoeducational Group Note  Date: 04/09/2013 Time:  04/09/2013  Group Topic/Focus:  Gratefulness:  The focus of this group is to help patients identify what two things they are most grateful for in their lives. What helps ground them and to center them on their work to their recovery.  Participation Level:  Active  Participation Quality:  Appropriate  Affect:  Appropriate  Cognitive:  Appropriate  Insight:  Lacking  Engagement in Group:  Engaged  Additional Comments:   Dione Housekeeper

## 2013-04-09 NOTE — Progress Notes (Signed)
Pt's status remains unchanged. Continues with preoccupation of pain meds. Will insist on having it earlier than q6h. Given ibuprofen and librium prn. Denies SI/HI/AVH and remains safe. Lawrence Marseilles

## 2013-04-10 ENCOUNTER — Ambulatory Visit: Payer: No Typology Code available for payment source | Admitting: Physical Therapy

## 2013-04-10 NOTE — Progress Notes (Signed)
Patient's continues to want percocet asap.  Explained to patient that medication cannot be given before 6 hours are up.  Patient stated she had to wait for pain med this morning and nurse explained to patient that she was given her medications first before any patients.  Patient wants to leave group and get her medicine during group.   Explained to patient that medications cannot be given during group.  Patient continually asking for pain medication and medication was increased several days ago.  Patient was given percocet 1.5 tab at 0745, librium for anxiety at 1206, ibuprofen at 1324.  Patient is continually asking about her medications before and after groups.

## 2013-04-10 NOTE — [facility] Group Notes (Signed)
[facility] LCSW Group Therapy          Overcoming Obstacles       1:15 -2:30        04/10/2013   1:11 PM     Type of Therapy:  Group Therapy  Participation Level:  Appropriate  Participation Quality:  Appropriate  Affect:  Appropriate  Cognitive:  Attentive Appropriate  Insight:  Engaged  Engagement in Therapy:  Engaged  Modes of Intervention:  Discussion Exploration Problem-Solving Supportive  Summary of Progress/Problems:  Patient shared the obstacle she needs to overcome is getting discharged.  She shared she asked boyfriend to Higher education careers adviser because she hopes to discharge tomorrow.   She was unable to identify any other obstacle she needs to work on.  Wynn Banker 04/10/2013    1:11 PM

## 2013-04-10 NOTE — Progress Notes (Signed)
D: Patient in the hallway on first approach wanting her pain medication.  Patient was intrusive at that time and Clinical research associate redirected her.  Patient states she is supposed to have her pain medication every 6 hours.  Patient states she was upset today because she was supposed to leave but staff was unable to get in touch with her boyfriend to do suicide prevention so patient states she was upset about that.  Patient denies SI/HI and denies AVH A: Staff to monitor Q 15 mins for safety.  Encouragement and support offered.  Scheduled medications administered per orders. Percocet administered prn for right shoulder pain. Trazodone administered prn for sleep. R: Patient remains safe on the unit.  Patient attended group tonight.  Patient visible on the unit.  Patient calm, cooperative and taking administered medications.  Once patient received prn pain medication she became less intrusive.

## 2013-04-10 NOTE — Progress Notes (Signed)
Patient ID: [name], female   DOB: 05/16/1966, 47 y.o.   MRN: [id number] D:  Patient's self inventory sheet, patient has fair sleep, good appetite, good attention span.  Rated depression and hopelessness #2, anxiety #7.  Denied withdrawals.  Denied SI.  Has felt agitation and pain.  Pain goal #5, worst pain #8.  After discharge, plans to stay on medication.   No discharge plans.  No problems taking meds after discharge. A:  Medications administered per MD orders.  Emotional support and encouragement given patient. R:  Denied SI and HI.   Denied A/V hallucinations.  Will continue to monitor for safety with 15 minute checks.  Safety maintained.

## 2013-04-10 NOTE — Progress Notes (Signed)
[facility] Adult Case Management Discharge Plan :  Will you be returning to the same living situation after discharge: Yes,  Patient will return to her home. At discharge, do you have transportation home?:Yes,  Patient will arrange transportation home. Do you have the ability to pay for your medications:Yes,  Patient is able to afford medications.  Release of information consent forms completed and in the chart;  Patient's signature needed at discharge.  Patient to Follow up at: Follow-up Information   Follow up with Johnell Comings- [facility] On 04/17/2013. (You are scheduled with Johnell Comings on Monday, April 17, 2013 at 11:15 AM )    Contact information:   [address] [place], [place]   [address]  [phone]      Patient denies SI/HI:  Patient no longer endorsing SI/HI or other thoughts of self harm.   Safety Planning and Suicide Prevention discussed:  .Reviewed with all patients during discharge planning group   Dionisia Pacholski, Joesph July 04/10/2013, 10:57 AM

## 2013-04-10 NOTE — Progress Notes (Signed)
Psychoeducational Group Note  Date:  04/10/2013 Time:  1100  Group Topic/Focus:  Self Care:   The focus of this group is to help patients understand the importance of self-care in order to improve or restore emotional, physical, spiritual, interpersonal, and financial health.  Participation Level: Did Not Attend  Participation Quality:  Not Applicable  Additional Comments:  Patient did not attend group, patient remained in bed.  Karleen Hampshire Brittini 04/10/2013, 6:39 PM

## 2013-04-10 NOTE — Progress Notes (Signed)
Adult Psychoeducational Group Note  Date:  04/10/2013 Time:  4:41 AM  Group Topic/Focus:  Wrap-Up Group:   The focus of this group is to help patients review their daily goal of treatment and discuss progress on daily workbooks.  Participation Level:  Active  Participation Quality:  Appropriate  Affect:  Appropriate  Cognitive:  Appropriate  Insight: Good  Engagement in Group:  Engaged  Modes of Intervention:  Support  Additional Comments:  Patient attended and participated in group tonight. She reports that her boyfriend visited with her today . She attended her groups and went to the dining room for meals.   Lita Mains [facility] 04/10/2013, 4:41 AM

## 2013-04-10 NOTE — [facility] Suicide Risk Assessment (Signed)
[facility] INPATIENT:  Family/Significant Other Suicide Prevention Education  Suicide Prevention Education:  Education Completed; Dartha Lodge, Boyfriend, [phone];  has been identified by the patient as the family member/significant other with whom the patient will be residing, and identified as the person(s) who will aid the patient in the event of a mental health crisis (suicidal ideations/suicide attempt).  With written consent from the patient, the family member/significant other has been provided the following suicide prevention education, prior to the and/or following the discharge of the patient.  The suicide prevention education provided includes the following:  Suicide risk factors  Suicide prevention and interventions  National Suicide Hotline telephone number  [facility] assessment telephone number  [facility] Emergency Assistance 911  [facility] and/or Residential Mobile Crisis Unit telephone number  Request made of family/significant other to:  Remove weapons (e.g., guns, rifles, knives), all items previously/currently identified as safety concern.  Boyfriend advised there are no guns in the home.  Remove drugs/medications (over-the-counter, prescriptions, illicit drugs), all items previously/currently identified as a safety concern.  The family member/significant other verbalizes understanding of the suicide prevention education information provided.  The family member/significant other agrees to remove the items of safety concern listed above.  Wynn Banker 04/10/2013, 2:31 PM

## 2013-04-10 NOTE — Tx Team (Signed)
Interdisciplinary Treatment Plan Update   Date Reviewed:  04/10/2013  Time Reviewed:  9:58 AM  Progress in Treatment:   Attending groups: Yes Participating in groups: Yes Taking medication as prescribed: Yes  Tolerating medication: Yes Family/Significant other contact made:No, but will ask patient for consent for collateral contact Patient understands diagnosis: Yes  Discussing patient identified problems/goals with staff: Yes Medical problems stabilized or resolved: Yes Denies suicidal/homicidal ideation: Yes Patient has not harmed self or others: Yes  For review of initial/current patient goals, please see plan of care.  Estimated Length of Stay:  3-4 days  Reasons for Continued Hospitalization:  Anxiety Depression Medication stabilization  New Problems/Goals identified:    Discharge Plan or Barriers:   Patient reports feeling better and hopes to discharge soon. Home with outpatient follow up at [facility]   Will continue to monitor for safety.  Attendees:  Patient: [name] 04/10/2013 9:58 AM   Signature: Mervyn Gay, MD 04/10/2013 9:58 AM  Signature: 04/10/2013 9:58 AM  Signature:Carol Earlene Plater, RN 04/10/2013 9:58 AM  Signature:Beverly Terrilee Croak, RN 04/10/2013 9:58 AM  Signature:  04/10/2013 9:58 AM  Signature:  Juline Patch, LCSW 04/10/2013 9:58 AM  Signature:  Reyes Ivan, LCSW 04/10/2013 9:58 AM  Signature:  Maseta Dorley,Care Coordinator 04/10/2013 9:58 AM  Signature: 04/10/2013 9:58 AM  Signature:    Signature:    Signature:      Scribe for Treatment Team:   Juline Patch,  04/10/2013 9:58 AM

## 2013-04-10 NOTE — Progress Notes (Signed)
[facility] MD Progress Note  04/10/2013 2:05 PM [name]  MRN:  [id number]  Subjective: Patient is admitted for worsening symptoms of Major Depressive Disorder. The patient has been tolerating her medication and denies adverse effects of medication. She has depression and anxiety but feels getting better with her therapies and medications. She was visited by her boy friend last evening and case manager is trying to contact him regarding disposition plans. She is learning coping skills and she is aware that the needs to stay on her medications. She has slept well last night with her adjusted medication.   Diagnosis: Axis I: Major Depression, Recurrent severe  ADL's:  Intact  Sleep: Poor-Improving  Appetite:  Fair  Suicidal Ideation:  Plan:  Patient denies  Intent:    Patient denies  Means:  Patient denies  Homicidal Ideation:  Plan:    Patient denies  Intent:    Patient denies  Means:    Patient denies  AEB (as evidenced by):  Psychiatric Specialty Exam: Review of Systems  Constitutional: Negative for fever, chills and weight loss.  Respiratory: Negative for cough, hemoptysis, sputum production, shortness of breath and wheezing.   Cardiovascular: Negative for chest pain and palpitations.  Gastrointestinal: Negative for heartburn, nausea, vomiting, abdominal pain, diarrhea and constipation.  Neurological: Negative for dizziness, tingling, tremors, sensory change, seizures and loss of consciousness.    Blood pressure 95/63, pulse 87, temperature 97.9 F (36.6 C), temperature source Oral, resp. rate 16, height 5\' 6"  (1.676 m), weight 106.142 kg (234 lb).Body mass index is 37.79 kg/(m^2).  General Appearance: Casual and Disheveled  Eye Contact::  Good  Speech:  Clear and Coherent  Volume:  Normal  Mood:  "good mood"  Affect:  Appropriate, Congruent and Full Range  Thought Process:  Circumstantial, Linear and Logical  Orientation:  Full (Time, Place, and Person)  Thought  Content:  WDL  Suicidal Thoughts:  No  Homicidal Thoughts:  No  Memory:  Immediate;   Good Recent;   Good Remote;   Good  Judgement:  Fair  Insight:  Shallow  Psychomotor Activity:  Normal  Concentration:  Good  Recall:  Fair  Akathisia:  Negative  Handed:  Left  AIMS (if indicated):   Not indicated  Assets:  Communication Skills Desire for Improvement Housing Social Support  Sleep:  Number of Hours: 5.5   Current Medications: Current Facility-Administered Medications  Medication Dose Route Frequency Provider Last Rate Last Dose  . alum & mag hydroxide-simeth (MAALOX/MYLANTA) 200-200-20 MG/5ML suspension 30 mL  30 mL Oral Q4H PRN Kerry Hough, PA-C      . chlordiazePOXIDE (LIBRIUM) capsule 25 mg  25 mg Oral QID PRN Kerry Hough, PA-C   25 mg at 04/10/13 1205  . ibuprofen (ADVIL,MOTRIN) tablet 600 mg  600 mg Oral Q6H PRN Kerry Hough, PA-C   600 mg at 04/10/13 1321  . magnesium hydroxide (MILK OF MAGNESIA) suspension 30 mL  30 mL Oral Daily PRN Kerry Hough, PA-C      . nicotine (NICODERM CQ - dosed in mg/24 hours) patch 14 mg  14 mg Transdermal Q0600 Kerry Hough, PA-C   14 mg at 04/10/13 0747  . oxyCODONE-acetaminophen (PERCOCET/ROXICET) 5-325 MG per tablet 1.5 tablet  1.5 tablet Oral Q6H PRN Fransisca Kaufmann, NP   1.5 tablet at 04/10/13 1350  . sertraline (ZOLOFT) tablet 200 mg  200 mg Oral Daily Kerry Hough, PA-C   200 mg at 04/10/13 0749  . traZODone (  DESYREL) tablet 100 mg  100 mg Oral QHS PRN Larena Sox, MD   100 mg at 04/09/13 2106    Lab Results: No results found for this or any previous visit (from the past 48 hour(s)).  Physical Findings: AIMS: Facial and Oral Movements Muscles of Facial Expression: None, normal Lips and Perioral Area: None, normal Jaw: None, normal Tongue: None, normal,Extremity Movements Upper (arms, wrists, hands, fingers): None, normal Lower (legs, knees, ankles, toes): None, normal, Trunk Movements Neck, shoulders, hips:  None, normal, Overall Severity Severity of abnormal movements (highest score from questions above): None, normal Incapacitation due to abnormal movements: None, normal Patient's awareness of abnormal movements (rate only patient's report): No Awareness, Dental Status Current problems with teeth and/or dentures?: No Does patient usually wear dentures?: No  CIWA:  CIWA-Ar Total: 2 COWS:  COWS Total Score: 1  Treatment Plan Summary: Daily contact with patient to assess and evaluate symptoms and progress in treatment Medication management  Plan:  1. Provided support for treatment  2. Encouraged to participate in milieu and group therapy  3. Continue 15 minute checks for safety  4. Continue Zoloft 200 mg daily which helped in the past  5. Continue Trazodone 100 mg daily. 6. Continue her pain medication as prescribed  7. Case manager will contact with patient regarding disposition plans   Medical Decision Making Problem Points:  Established problem, stable/improving (1), Review of last therapy session (1) and Review of psycho-social stressors (1) Data Points:  Review and summation of old records (2) Review of medication regiment & side effects (2)  I certify that inpatient services furnished can reasonably be expected to improve the patient's condition.   Gregory Dowe,[place] R. 04/10/2013, 2:05 PM

## 2013-04-11 DIAGNOSIS — Z9119 Patient's noncompliance with other medical treatment and regimen: Secondary | ICD-10-CM

## 2013-04-11 MED ORDER — SERTRALINE HCL 100 MG PO TABS: 200.0000 mg | ORAL_TABLET | Freq: Every day | ORAL | Status: DC

## 2013-04-11 MED ORDER — TRAZODONE HCL 100 MG PO TABS: 100.0000 mg | ORAL_TABLET | Freq: Every evening | ORAL | Status: DC | PRN

## 2013-04-11 MED ORDER — OXYCODONE-ACETAMINOPHEN 7.5-325 MG PO TABS: 1.0000 | ORAL_TABLET | Freq: Four times a day (QID) | ORAL | Status: DC | PRN

## 2013-04-11 MED ORDER — ALPRAZOLAM 1 MG PO TABS: 1.0000 mg | ORAL_TABLET | Freq: Three times a day (TID) | ORAL | Status: DC | PRN

## 2013-04-11 NOTE — Progress Notes (Signed)
D:  Patient's self inventory sheet, patient sleeps well, has good appetite, normal energy level, good attention span.  Denied depression and hopelessness.  Denied withdrawals.  Denied SI.   Has experienced pain in past 24 hours.  Pain goal #5.  Plans to stay on her meds after discharge.  No questions for staff.  Does have discharge plans.  No problems taking meds after discharge. A:  Medications administered per MD orders.  Emotional support and encouragement given patient. R:  Denied SI and HI.  Denied A/V hallucinations.  Will continue to monitor for safety with every 15 minute checks.  Safety maintained.

## 2013-04-11 NOTE — Progress Notes (Signed)
[facility] Adult Case Management Discharge Plan :  Will you be returning to the same living situation after discharge: Yes,  Patient to returning to her home. At discharge, do you have transportation home?:Yes,  Patient has transportation home. Do you have the ability to pay for your medications:Patient is able to obtain medications.   Release of information consent forms completed and in the chart;  Patient's signature needed at discharge.  Patient to Follow up at: Follow-up Information   Follow up with Johnell Comings- [facility] On 04/17/2013. (You are scheduled with Johnell Comings on Monday, April 17, 2013 at 11:15 AM )    Contact information:   [address] [place], [place]   [address]  [phone]      Patient denies SI/HI:   Patient no longer endorsing SI/HI or other thoughts of self harm.   Safety Planning and Suicide Prevention discussed:.Reviewed with all patients during discharge planning group   Sylwia Cuervo, Joesph July 04/11/2013, 11:26 AM

## 2013-04-11 NOTE — Plan of Care (Signed)
Problem: Alteration in mood Goal: LTG-Pt's behavior demonstrates decreased signs of depression (Patient's behavior demonstrates decreased signs of depression to the point the patient is safe to return home and continue treatment in an outpatient setting)  Outcome: Completed/Met Date Met:  04/11/13 Patient has attended groups and easily engaged in discussions.  She is currently rating depression at five. Outpatient follow up is scheduled.  Rahshawn Remo, LCSW 04/11/2013

## 2013-04-11 NOTE — Progress Notes (Signed)
Recreation Therapy Notes  Date: 06.17.2014 Time: 2:45pm Location: [address] Day[address]      Group Topic/Focus: Musician (AAA/T)  Participation Level: Active  Participation Quality: Appropriate  Affect: Euthymic  Cognitive: Appropriate  Additional Comments: 06.17.2014 Session = AAA Session; Dog Team = [facility] & handler  Patient pet and visited with Coward. Patient interacted appropriately with dog team, peers and LRT.   Marykay Lex Javoris Star, LRT/CTRS  Jearl Klinefelter 04/11/2013 4:32 PM

## 2013-04-11 NOTE — Progress Notes (Signed)
Chaplain provided support with pt around discharge.  Pt in good spirits.

## 2013-04-11 NOTE — [facility] Group Notes (Signed)
[facility] LCSW Aftercare Discharge Planning Group Note  04/11/2013  11:28 AM    Participation Quality:  Appropriate   Mood/Affect:  Appropriate  Depression Rating:  1  Anxiety Rating: 5  Thoughts of Suicide:  No  Will you contract for safety?   No  Current AVH:  NA  Plan for Discharge/Comments:  Patient reports doing well and being ready to discharge today.  She is requesting a letter with hospitalization dates.  Transportation Means: Patient has transportation.  Supports:  Patient has limited support system.

## 2013-04-11 NOTE — Plan of Care (Signed)
Problem: Alteration in mood; excessive anxiety as evidenced by: Goal: LTG-Patient's behavior demonstrates decreased anxiety Patient rates depression at seven. She is attending groups and talking about problems. Horace Porteous Daylene Vandenbosch, LCSW 04/07/2013 Outcome: Completed/Met Date Met:  04/11/13 Patient is rating anxiety at five.  She has attended groups and engaged in treatment.  Follow up is scheudled.  Horace Porteous Emauri Krygier, LCSW 04/11/2013

## 2013-04-11 NOTE — Progress Notes (Signed)
Adult Psychoeducational Group Note  Date:  04/11/2013 Time:  1:08 PM  Group Topic/Focus:  Recovery Goals:   The focus of this group is to identify appropriate goals for recovery and establish a plan to achieve them.  Participation Level:  Did Not Attend  Participation Quality:  Did not Attend  Affect:  Did not Attend  Cognitive:  Did not Attend  Insight: None  Engagement in Group:  Did not Attend  Modes of Intervention:  Did not Attend  Additional Comments:    Cathlean Cower 04/11/2013, 1:08 PM

## 2013-04-11 NOTE — Discharge Summary (Signed)
Physician Discharge Summary Note  Patient:  [name] is an 47 y.o., female MRN:  [id number] DOB:  August 31, 1966 Patient phone:  [phone] (home)  Patient address:   [address] [place] [address],   Date of Admission:  04/05/2013 Date of Discharge: 04/11/13  Reason for Admission:  Depression with SI  Discharge Diagnoses: Active Problems:   * No active hospital problems. *  ROS Axis Diagnosis:   AXIS I:  Major Depression, Recurrent severe, Post Traumatic Stress Disorder and Non compliance with medication AXIS II:  Deferred AXIS III:   Past Medical History  Diagnosis Date  . PID (acute pelvic inflammatory disease)   . Chlamydia   . Depression   . PTSD (post-traumatic stress disorder)   . ADD (attention deficit disorder with hyperactivity)   . Bilateral ovarian cysts    AXIS IV:  other psychosocial or environmental problems and problems related to social environment AXIS V:  61-70 mild symptoms  Level of Care:  OP  Hospital Course:  [name] is a 47 y.o. female who presents voluntarily to [facility] with SI/Depression. Pt is SI w/plan to cut wrists. Pt reports being off her medications for 2 wks--"I wanted to live my life without being on medications, it's going very well". Pt says she's been feeling SI x 24hrs and felt like she needed to come to the hospital. Pt has a substance abuse hx and UDS is + for Cocaine. Pt says she used Cocaine this past weekend( "20") but states hasn't used in 3 mos; reports intermittent sobriety and no inpt admissions for substance abuse.       The duration of stay was six days. The patient was seen and evaluated by the Treatment team consisting of Psychiatrist, NP-C, RN, Case Manager, and Therapist for evaluation and treatment plan with goal of stabilization upon discharge. The patient's physical and mental health problems were identified and treated appropriately. The patient felt like her depressive symptoms had worsened since  being off her zoloft for the previous two weeks leading up to her admission.       Multiple modalities of treatment were used including medication, individual and group therapies, unit programming, improved nutrition, physical activity, and family sessions as needed. Patient's home medications of Zoloft 200 mg was continued upon admission. Her adderrall and xanax were not continued. Patient was ordered librium 25 mg prn. Trazodone 100 mg hs prn was initiated to improve quality of sleep. Her prescription for percocet was verified with her pharmacy and ordered as patient complained of right shoulder pain. The nursing staff reported that the patient was very fixated on obtaining her pain medication as often as was possible and requested several times to increase the frequency to every four hours.      The symptoms of depression were monitored daily by evaluation by clinical provider.  The patient's mental and emotional status was evaluated by a daily self inventory completed by the patient. Patient showed more insight about the reasons it is important to stay on her medications and reports desire to be more compliant in the future. The patient on admission compared needing medication for diabetes to being the same as needing medication for depression.       Improvement was demonstrated by declining numbers on the self assessment, improving vital signs, increased cognition, and improvement in mood, sleep, appetite as well as a reduction in physical symptoms.       The patient was evaluated and found to be stable  enough for discharge and was released to home per the initial plan of treatment. Patient denied SI to multiple staff members prior to her discharge. Patient plans to return to stay with her supportive boyfriend. Patient given prescription for trazodone which was started in the hospital and a sample medications. The patient will follow up with [facility] for follow up.   Mental Status Exam:  For  mental status exam please see mental status exam and  suicide risk assessment completed by attending physician prior to discharge.  Consults:  None  Significant Diagnostic Studies:  labs: Chem profile, UDS, UA  Discharge Vitals:   Blood pressure 99/65, pulse 78, temperature 97.7 F (36.5 C), temperature source Oral, resp. rate 18, height 5\' 6"  (1.676 m), weight 106.142 kg (234 lb). Body mass index is 37.79 kg/(m^2). Lab Results:   No results found for this or any previous visit (from the past 72 hour(s)).  Physical Findings: AIMS: Facial and Oral Movements Muscles of Facial Expression: None, normal Lips and Perioral Area: None, normal Jaw: None, normal Tongue: None, normal,Extremity Movements Upper (arms, wrists, hands, fingers): None, normal Lower (legs, knees, ankles, toes): None, normal, Trunk Movements Neck, shoulders, hips: None, normal, Overall Severity Severity of abnormal movements (highest score from questions above): None, normal Incapacitation due to abnormal movements: None, normal Patient's awareness of abnormal movements (rate only patient's report): No Awareness, Dental Status Current problems with teeth and/or dentures?: No Does patient usually wear dentures?: No  CIWA:  CIWA-Ar Total: 2 COWS:  COWS Total Score: 1  Psychiatric Specialty Exam: See Psychiatric Specialty Exam and Suicide Risk Assessment completed by Attending Physician prior to discharge.  Discharge destination:  Home  Is patient on multiple antipsychotic therapies at discharge:  No   Has Patient had three or more failed trials of antipsychotic monotherapy by history:  No  Recommended Plan for Multiple Antipsychotic Therapies: N/A  Discharge Orders   Future Orders Complete By Expires     Activity as tolerated - No restrictions  As directed         Medication List    STOP taking these medications       amphetamine-dextroamphetamine 20 MG tablet  Commonly known as:  ADDERALL      TAKE  these medications     Indication   ALPRAZolam 1 MG tablet  Commonly known as:  XANAX  Take 1 tablet (1 mg total) by mouth 3 (three) times daily as needed. anxiety   Indication:  Feeling Anxious     oxyCODONE-acetaminophen 7.5-325 MG per tablet  Commonly known as:  PERCOCET  Take 1-2 tablets by mouth every 6 (six) hours as needed for pain.   Indication:  Pain     sertraline 100 MG tablet  Commonly known as:  ZOLOFT  Take 2 tablets (200 mg total) by mouth daily.   Indication:  Major Depressive Disorder     traZODone 100 MG tablet  Commonly known as:  DESYREL  Take 1 tablet (100 mg total) by mouth at bedtime as needed for sleep.   Indication:  Trouble Sleeping, Major Depressive Disorder           Follow-up Information   Follow up with Johnell Comings- [facility] On 04/17/2013. (You are scheduled with Johnell Comings on Monday, April 17, 2013 at 11:15 AM )    Contact information:   [address] [place], [place]   [address]  [phone]      Follow-up recommendations:  Activity:  Resume usual activities.  Diet:  Regular  Comments:   Take all your medications as prescribed by your mental healthcare provider.  Report any adverse effects and or reactions from your medicines to your outpatient provider promptly.  Patient is instructed and cautioned to not engage in alcohol and or illegal drug use while on prescription medicines.  In the event of worsening symptoms, patient is instructed to call the crisis hotline, 911 and or go to the nearest ED for appropriate evaluation and treatment of symptoms.  Follow-up with your primary care provider for your other medical issues, concerns and or health care needs.     Total Discharge Time:  Greater than 30 minutes.  SignedFransisca Kaufmann NP-C 04/11/2013, 10:12 AM  Patient was personally examined and case discussed with the physician extender and developed treatment plan. Reviewed the information documented and agree with the  discharge treatment plan.  [name],Virginia R. 04/11/2013 6:50 PM

## 2013-04-11 NOTE — Progress Notes (Signed)
Discharge Note:  Patient denied SI and HI.  Denied A/V hallucinations.  Denied pain.  Patient discharged home with her boyfriend.  Suicide prevention information given to patient and discussed with patient, who stated she understood and had no questions.  Patient received all her belongings, clothing, miscellaneous items, toiletries, medications, prescriptions.  Patient stated she appreciated all assistance received from [facility] staff.
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
## 2013-04-12 ENCOUNTER — Encounter: Payer: No Typology Code available for payment source | Admitting: Rehabilitation

## 2013-04-12 NOTE — Progress Notes (Signed)
Patient Discharge Instructions:  After Visit Summary (AVS):   Faxed to:  04/12/13 Discharge Summary Note:   Faxed to:  04/12/13 Psychiatric Admission Assessment Note:   Faxed to:  04/12/13 Suicide Risk Assessment - Discharge Assessment:   Faxed to:  04/12/13 Faxed/Sent to the Next Level Care provider:  04/12/13 Faxed to [facility] @ [phone]  Jerelene Redden, 04/12/2013, 4:14 PM

## 2013-05-09 ENCOUNTER — Ambulatory Visit: Payer: No Typology Code available for payment source | Attending: Orthopedic Surgery | Admitting: Physical Therapy

## 2013-05-09 DIAGNOSIS — IMO0001 Reserved for inherently not codable concepts without codable children: Secondary | ICD-10-CM | POA: Insufficient documentation

## 2013-05-09 DIAGNOSIS — M25519 Pain in unspecified shoulder: Secondary | ICD-10-CM | POA: Insufficient documentation

## 2013-05-09 DIAGNOSIS — R293 Abnormal posture: Secondary | ICD-10-CM | POA: Insufficient documentation

## 2013-05-19 ENCOUNTER — Emergency Department (HOSPITAL_COMMUNITY)
Admission: EM | Admit: 2013-05-19 | Discharge: 2013-05-19 | Disposition: A | Discharge status: Home / Self Care | Payer: No Typology Code available for payment source | Attending: Emergency Medicine | Admitting: Emergency Medicine

## 2013-05-19 ENCOUNTER — Emergency Department (HOSPITAL_COMMUNITY): Payer: No Typology Code available for payment source

## 2013-05-19 DIAGNOSIS — S91309A Unspecified open wound, unspecified foot, initial encounter: Secondary | ICD-10-CM | POA: Insufficient documentation

## 2013-05-19 DIAGNOSIS — T148XXA Other injury of unspecified body region, initial encounter: Secondary | ICD-10-CM

## 2013-05-19 DIAGNOSIS — F3289 Other specified depressive episodes: Secondary | ICD-10-CM | POA: Insufficient documentation

## 2013-05-19 DIAGNOSIS — Z79899 Other long term (current) drug therapy: Secondary | ICD-10-CM | POA: Insufficient documentation

## 2013-05-19 DIAGNOSIS — Y9389 Activity, other specified: Secondary | ICD-10-CM | POA: Insufficient documentation

## 2013-05-19 DIAGNOSIS — W268XXA Contact with other sharp object(s), not elsewhere classified, initial encounter: Secondary | ICD-10-CM | POA: Insufficient documentation

## 2013-05-19 DIAGNOSIS — F4310 Post-traumatic stress disorder, unspecified: Secondary | ICD-10-CM | POA: Insufficient documentation

## 2013-05-19 DIAGNOSIS — F17200 Nicotine dependence, unspecified, uncomplicated: Secondary | ICD-10-CM | POA: Insufficient documentation

## 2013-05-19 DIAGNOSIS — F329 Major depressive disorder, single episode, unspecified: Secondary | ICD-10-CM | POA: Insufficient documentation

## 2013-05-19 DIAGNOSIS — Z8659 Personal history of other mental and behavioral disorders: Secondary | ICD-10-CM | POA: Insufficient documentation

## 2013-05-19 DIAGNOSIS — Z8742 Personal history of other diseases of the female genital tract: Secondary | ICD-10-CM | POA: Insufficient documentation

## 2013-05-19 DIAGNOSIS — Y929 Unspecified place or not applicable: Secondary | ICD-10-CM | POA: Insufficient documentation

## 2013-05-19 DIAGNOSIS — Z23 Encounter for immunization: Secondary | ICD-10-CM | POA: Insufficient documentation

## 2013-05-19 DIAGNOSIS — Z8619 Personal history of other infectious and parasitic diseases: Secondary | ICD-10-CM | POA: Insufficient documentation

## 2013-05-19 MED ORDER — TETANUS-DIPHTH-ACELL PERTUSSIS 5-2.5-18.5 LF-MCG/0.5 IM SUSP
0.5000 mL | Freq: Once | INTRAMUSCULAR | Status: AC
  Administered 2013-05-19: 0.5 mL via INTRAMUSCULAR
  Filled 2013-05-19: qty 0.5

## 2013-05-19 MED ORDER — CIPROFLOXACIN HCL 500 MG PO TABS: 500.0000 mg | ORAL_TABLET | Freq: Two times a day (BID) | ORAL | Status: DC

## 2013-05-19 MED ORDER — OXYCODONE-ACETAMINOPHEN 5-325 MG PO TABS
1.0000 | ORAL_TABLET | Freq: Once | ORAL | Status: AC
  Administered 2013-05-19: 1 via ORAL
  Filled 2013-05-19: qty 1

## 2013-05-19 NOTE — ED Provider Notes (Signed)
CSN: [id number]     Arrival date & time 05/19/13  1906 History    This chart was scribed for a non-physician practitioner, Roxy Horseman, PA-C, working with Rolan Bucco, MD by Frederik Pear, ED Scribe. This patient was seen in room WTR5/WTR5 and the patient's care was started at 1925.   First MD Initiated Contact with Patient 05/19/13 1925     No chief complaint on file.  (Consider location/radiation/quality/duration/timing/severity/associated sxs/prior Treatment) The history is provided by the patient and medical records. No language interpreter was used.    HPI Comments: [name] is a 47 y.o. female who presents to the Emergency Department with the chief complaint of a puncture wound to the sole of her left foot that occurred at 1900 when she stepped on what she believes was a piece of metal that perforated her flipflop. In ED, she complains of moderate pain to the sole of her left foot. She denies associated symptoms. No modifying factors. She reports she is concerned that part of the object is still lodged insider her foot. She treated the pain at home with ibuprofen, which she denies provided relief. She reports her tetanus was last updated more than 5 years ago. She is allergic to imitrex, clindamycin, morphine, and Vicodin.  Past Medical History  Diagnosis Date  . PID (acute pelvic inflammatory disease)   . Chlamydia   . Depression   . PTSD (post-traumatic stress disorder)   . ADD (attention deficit disorder with hyperactivity)   . Bilateral ovarian cysts    Past Surgical History  Procedure Laterality Date  . Back surgery      dbl lumbar fusion L5-S1  . Bladder surgery    . Abdominal hysterectomy    . Laparoscopy    . Rotator cuff repair Right 02/15/2013   No family history on file. History  Substance Use Topics  . Smoking status: Current Every Day Smoker -- 0.25 packs/day for 14 years    Types: Cigarettes  . Smokeless tobacco: Not on file  . Alcohol Use: No    OB History   Grav Para Term Preterm Abortions TAB SAB Ect Mult Living                 Review of Systems A complete 10 system review of systems was obtained and all systems are negative except as noted in the HPI and PMH.  Allergies  Imitrex; Clindamycin/lincomycin; Morphine and related; and Vicodin  Home Medications   Current Outpatient Rx  Name  Route  Sig  Dispense  Refill  . ALPRAZolam (XANAX) 1 MG tablet   Oral   Take 1 tablet (1 mg total) by mouth 3 (three) times daily as needed. anxiety   30 tablet   0   . oxyCODONE-acetaminophen (PERCOCET) 7.5-325 MG per tablet   Oral   Take 1-2 tablets by mouth every 6 (six) hours as needed for pain.   30 tablet   0   . sertraline (ZOLOFT) 100 MG tablet   Oral   Take 2 tablets (200 mg total) by mouth daily.         . traZODone (DESYREL) 100 MG tablet   Oral   Take 1 tablet (100 mg total) by mouth at bedtime as needed for sleep.   30 tablet   0    BP 127/89  Pulse 80  Temp(Src) 98.6 F (37 C) (Oral)  Resp 16  SpO2 100% Physical Exam  Nursing note and vitals reviewed. Constitutional: She is oriented  to person, place, and time. She appears well-developed and well-nourished. No distress.  HENT:  Head: Normocephalic and atraumatic.  Eyes: EOM are normal. Pupils are equal, round, and reactive to light.  Neck: Normal range of motion. Neck supple. No tracheal deviation present.  Cardiovascular: Normal rate, regular rhythm and normal heart sounds.  Exam reveals no gallop and no friction rub.   No murmur heard. Pulmonary/Chest: Effort normal. No respiratory distress. She has no wheezes. She has no rales. She exhibits no tenderness.  Abdominal: Soft. She exhibits no distension.  Musculoskeletal: Normal range of motion. She exhibits no edema.  Neurological: She is alert and oriented to person, place, and time.  Skin: Skin is warm and dry.  0.5 cm shallow puncture wound to sole of the left foot. No FB. No complications.    Psychiatric: She has a normal mood and affect. Her behavior is normal.    ED Course   Procedures (including critical care time)  DIAGNOSTIC STUDIES: Oxygen Saturation is 95% on room air, adequate by my interpretation.    COORDINATION OF CARE:  19:39- Discussed planned course of treatment with the patient, including a left foot X-ray, soaking her foot in Betadine, and a tetanus injection, who is agreeable at this time.  20:09- Discussed X-ray findings with the pt, which were negative for a radiopaque FB. Discussed discharging the pt with Cipro, managing her pain in the ED with Percocet, and returning the the ED if the wound became erythematous, draining, or edematous.  Labs Reviewed - No data to display Dg Foot Complete Left  05/19/2013   *RADIOLOGY REPORT*  Clinical Data: Stepped on something today, now with small laceration involving the plantar surface of the foot, evaluate for foreign body  LEFT FOOT - COMPLETE 3+ VIEW  Comparison: None.  Findings:  No fracture or dislocation.  Regional soft tissues are normal.  No radiopaque foreign body.  Joint spaces are preserved.  No definite erosions.  IMPRESSION: No fracture or radiopaque foreign body.   Original Report Authenticated By: Tacey Ruiz, MD   1. Puncture wound     MDM  Patient with puncture wound to foot. It is not complicated. Irrigated and soaked in the ED. Will discharge to home with Cipro. Patient is stable and ready for discharge.  I personally performed the services described in this documentation, which was scribed in my presence. The recorded information has been reviewed and is accurate.    Roxy Horseman, PA-C 05/20/13 0023

## 2013-05-19 NOTE — ED Notes (Signed)
Pt was wearing flip flops and stepped on a piece of metal. The object went through her flip flop and cut the bottom of her left foot. Pt has not had a tetanus shot in the past five years

## 2013-05-20 NOTE — ED Provider Notes (Signed)
Medical screening examination/treatment/procedure(s) were performed by non-physician practitioner and as supervising physician I was immediately available for consultation/collaboration.   Angeletta Goelz, MD 05/20/13 1632 

## 2013-07-03 ENCOUNTER — Encounter (HOSPITAL_COMMUNITY): Payer: Self-pay | Admitting: Emergency Medicine

## 2013-07-03 ENCOUNTER — Emergency Department (HOSPITAL_COMMUNITY)
Admission: EM | Admit: 2013-07-03 | Discharge: 2013-07-03 | Disposition: A | Discharge status: Home / Self Care | Payer: No Typology Code available for payment source | Attending: Emergency Medicine | Admitting: Emergency Medicine

## 2013-07-03 DIAGNOSIS — F4310 Post-traumatic stress disorder, unspecified: Secondary | ICD-10-CM | POA: Insufficient documentation

## 2013-07-03 DIAGNOSIS — Z8742 Personal history of other diseases of the female genital tract: Secondary | ICD-10-CM | POA: Insufficient documentation

## 2013-07-03 DIAGNOSIS — Z8619 Personal history of other infectious and parasitic diseases: Secondary | ICD-10-CM | POA: Insufficient documentation

## 2013-07-03 DIAGNOSIS — G8929 Other chronic pain: Secondary | ICD-10-CM | POA: Insufficient documentation

## 2013-07-03 DIAGNOSIS — F3289 Other specified depressive episodes: Secondary | ICD-10-CM | POA: Insufficient documentation

## 2013-07-03 DIAGNOSIS — F17200 Nicotine dependence, unspecified, uncomplicated: Secondary | ICD-10-CM | POA: Insufficient documentation

## 2013-07-03 DIAGNOSIS — Z79899 Other long term (current) drug therapy: Secondary | ICD-10-CM | POA: Insufficient documentation

## 2013-07-03 DIAGNOSIS — F329 Major depressive disorder, single episode, unspecified: Secondary | ICD-10-CM | POA: Insufficient documentation

## 2013-07-03 DIAGNOSIS — M545 Low back pain, unspecified: Secondary | ICD-10-CM | POA: Insufficient documentation

## 2013-07-03 DIAGNOSIS — Z792 Long term (current) use of antibiotics: Secondary | ICD-10-CM | POA: Insufficient documentation

## 2013-07-03 DIAGNOSIS — Z9889 Other specified postprocedural states: Secondary | ICD-10-CM | POA: Insufficient documentation

## 2013-07-03 MED ORDER — OXYCODONE-ACETAMINOPHEN 5-325 MG PO TABS
2.0000 | ORAL_TABLET | Freq: Once | ORAL | Status: AC
  Administered 2013-07-03: 2 via ORAL
  Filled 2013-07-03: qty 2

## 2013-07-03 MED ORDER — OXYCODONE-ACETAMINOPHEN 5-325 MG PO TABS: 1.0000 | ORAL_TABLET | Freq: Four times a day (QID) | ORAL | Status: DC | PRN

## 2013-07-03 MED ORDER — METHOCARBAMOL 500 MG PO TABS
500.0000 mg | ORAL_TABLET | Freq: Once | ORAL | Status: AC
  Administered 2013-07-03: 500 mg via ORAL
  Filled 2013-07-03: qty 1

## 2013-07-03 MED ORDER — METHOCARBAMOL 500 MG PO TABS: 500.0000 mg | ORAL_TABLET | Freq: Two times a day (BID) | ORAL | Status: DC

## 2013-07-03 MED ORDER — NAPROXEN 500 MG PO TABS: 500.0000 mg | ORAL_TABLET | Freq: Two times a day (BID) | ORAL | Status: DC

## 2013-07-03 NOTE — ED Provider Notes (Signed)
CSN: [id number]     Arrival date & time 07/03/13  1705 History  This chart was scribed for non-physician practitioner Georgeanna Harrison, working with Nelia Shi, MD by Dorothey Baseman, ED Scribe. This patient was seen in room WTR6/WTR6 and the patient's care was started at 5:54 PM.    Chief Complaint  Patient presents with  . Back Pain   The history is provided by the patient. No language interpreter was used.   HPI Comments: [name] is a 47 y.o. Female with a history of chronic back pain who presents to the Emergency Department complaining of constant, lower back pain onset yesterday while she was polishing the floor, exacerbated with sitting upright. She reports the pain radiates down both of her legs. She denies any numbness or paresthesias to her lower extremities. Patient reports she had back surgery in 2008 that was successful at relieving her pain, Dr.Took was the surgeon, but states that the back pain has recently returned. She reports she has taken Ibuprofen and used heating cream without relief. She denies fever, bowel or bladder incontinence, or any other symptoms.  Past Medical History  Diagnosis Date  . PID (acute pelvic inflammatory disease)   . Chlamydia   . Depression   . PTSD (post-traumatic stress disorder)   . ADD (attention deficit disorder with hyperactivity)   . Bilateral ovarian cysts    Past Surgical History  Procedure Laterality Date  . Back surgery      dbl lumbar fusion L5-S1  . Bladder surgery    . Abdominal hysterectomy    . Laparoscopy    . Rotator cuff repair Right 02/15/2013   No family history on file. History  Substance Use Topics  . Smoking status: Current Every Day Smoker -- 0.25 packs/day for 14 years    Types: Cigarettes  . Smokeless tobacco: Not on file  . Alcohol Use: No   OB History   Grav Para Term Preterm Abortions TAB SAB Ect Mult Living                 Review of Systems  A complete 10 system review of systems  was obtained and all systems are negative except as noted in the HPI and PMH.   Allergies  Imitrex; Clindamycin/lincomycin; Morphine and related; and Vicodin  Home Medications   Current Outpatient Rx  Name  Route  Sig  Dispense  Refill  . ALPRAZolam (XANAX) 1 MG tablet   Oral   Take 1 tablet (1 mg total) by mouth 3 (three) times daily as needed. anxiety   30 tablet   0   . ciprofloxacin (CIPRO) 500 MG tablet   Oral   Take 1 tablet (500 mg total) by mouth 2 (two) times daily.   14 tablet   0   . oxyCODONE-acetaminophen (PERCOCET) 7.5-325 MG per tablet   Oral   Take 1-2 tablets by mouth every 6 (six) hours as needed for pain.   30 tablet   0   . sertraline (ZOLOFT) 100 MG tablet   Oral   Take 2 tablets (200 mg total) by mouth daily.         . traZODone (DESYREL) 100 MG tablet   Oral   Take 1 tablet (100 mg total) by mouth at bedtime as needed for sleep.   30 tablet   0    Triage Vitals: BP 125/74  Pulse 71  Temp(Src) 98.4 F (36.9 C) (Oral)  Resp 20  SpO2  100%  Physical Exam  Nursing note and vitals reviewed. Constitutional: She is oriented to person, place, and time. She appears well-developed and well-nourished. She appears distressed.  Patient is tearful.  HENT:  Head: Normocephalic and atraumatic.  Eyes: Conjunctivae are normal.  Neck: Normal range of motion. Neck supple.  Cardiovascular: Normal rate, regular rhythm and normal heart sounds.   2+ DP pulses.  Pulmonary/Chest: Effort normal and breath sounds normal.  Musculoskeletal: Normal range of motion.  No L spine tenderness. Mild lumbar peri-spinal tenderness to palpation.   Neurological: She is alert and oriented to person, place, and time. She has normal reflexes.  2+ patellar and achilles reflexes bilaterally. Distal  sensation of both feet intact.   Skin: Skin is warm and dry.  Psychiatric: She has a normal mood and affect. Her behavior is normal.    ED Course  Procedures (including critical  care time)  DIAGNOSTIC STUDIES: Oxygen Saturation is 100% on room air, normal by my interpretation.    COORDINATION OF CARE: 5:59PM- Will order a dose of Percocet in the ED and will discharge the patient with a prescription for Percocet. Advised patient to follow up with her PCP, especially if there are any new or worsening symptoms. Discussed treatment plan with patient at bedside and patient verbalized agreement.     Labs Review Labs Reviewed - No data to display Imaging Review No results found.  MDM  No diagnosis found. Patient with back pain.  No neurological deficits and normal neuro exam.  Patient can walk but states is painful.  No loss of bowel or bladder control.  No concern for cauda equina.  No fever, night sweats, weight loss, h/o cancer, IVDU.  RICE protocol and pain medicine indicated and discussed with patient.   I personally performed the services described in this documentation, which was scribed in my presence. The recorded information has been reviewed and is accurate.    Pascal Lux [place], PA-C 07/03/13 1816

## 2013-07-03 NOTE — ED Notes (Addendum)
Pt reports 8/10 lower back pain that started on Saturday morning. Pt states she had back surgery in 2008, which relieved her previous back pain and has only had slight, intermittent back pain since. Pt denies any recent re-injury or trauma. Pt states she has been on her hands and knees doing house work. Pt states she has attempted ibuprofen, heat, and ice without relief. Pt ambulatory to treatment room.  Pt is A/Ox4 and in NAD.

## 2013-07-04 NOTE — ED Provider Notes (Signed)
Medical screening examination/treatment/procedure(s) were performed by non-physician practitioner and as supervising physician I was immediately available for consultation/collaboration.    Ammaar Encina L Sherrol Vicars, MD 07/04/13 1505 

## 2013-09-13 ENCOUNTER — Other Ambulatory Visit: Payer: Self-pay | Admitting: Orthopaedic Surgery

## 2013-09-13 DIAGNOSIS — M545 Low back pain: Secondary | ICD-10-CM

## 2013-09-25 ENCOUNTER — Inpatient Hospital Stay: Admission: RE | Admit: 2013-09-25 | Payer: Self-pay | Source: Ambulatory Visit

## 2013-10-06 ENCOUNTER — Other Ambulatory Visit: Payer: Self-pay

## 2013-10-09 ENCOUNTER — Ambulatory Visit
Admission: RE | Admit: 2013-10-09 | Discharge: 2013-10-09 | Disposition: A | Discharge status: Home / Self Care | Payer: No Typology Code available for payment source | Source: Ambulatory Visit | Attending: Orthopaedic Surgery | Admitting: Orthopaedic Surgery

## 2013-10-09 DIAGNOSIS — M545 Low back pain: Secondary | ICD-10-CM

## 2013-10-10 ENCOUNTER — Ambulatory Visit: Admission: RE | Admit: 2013-10-10 | Payer: No Typology Code available for payment source | Source: Ambulatory Visit

## 2013-10-11 ENCOUNTER — Ambulatory Visit: Payer: No Typology Code available for payment source | Admitting: Physical Therapy

## 2013-11-03 ENCOUNTER — Other Ambulatory Visit: Payer: Self-pay | Admitting: Orthopaedic Surgery

## 2013-11-03 DIAGNOSIS — M545 Low back pain, unspecified: Secondary | ICD-10-CM

## 2013-11-10 ENCOUNTER — Other Ambulatory Visit: Payer: No Typology Code available for payment source

## 2013-11-14 ENCOUNTER — Ambulatory Visit
Admission: RE | Admit: 2013-11-14 | Discharge: 2013-11-14 | Disposition: A | Discharge status: Home / Self Care | Payer: 59 | Source: Ambulatory Visit | Attending: Orthopaedic Surgery | Admitting: Orthopaedic Surgery

## 2013-12-06 ENCOUNTER — Encounter: Payer: Self-pay | Admitting: *Deleted

## 2013-12-06 ENCOUNTER — Ambulatory Visit (INDEPENDENT_AMBULATORY_CARE_PROVIDER_SITE_OTHER): Payer: 59 | Admitting: Physician Assistant

## 2013-12-06 ENCOUNTER — Encounter: Payer: Self-pay | Admitting: Physician Assistant

## 2013-12-06 VITALS — BP 148/100 | HR 88 | Temp 98.0°F | Resp 24 | Ht 66.5 in | Wt 269.1 lb

## 2013-12-06 DIAGNOSIS — F4310 Post-traumatic stress disorder, unspecified: Secondary | ICD-10-CM

## 2013-12-06 DIAGNOSIS — M549 Dorsalgia, unspecified: Secondary | ICD-10-CM

## 2013-12-06 DIAGNOSIS — Z0000 Encounter for general adult medical examination without abnormal findings: Secondary | ICD-10-CM

## 2013-12-06 DIAGNOSIS — Z1231 Encounter for screening mammogram for malignant neoplasm of breast: Secondary | ICD-10-CM

## 2013-12-06 DIAGNOSIS — F988 Other specified behavioral and emotional disorders with onset usually occurring in childhood and adolescence: Secondary | ICD-10-CM | POA: Insufficient documentation

## 2013-12-06 DIAGNOSIS — G8929 Other chronic pain: Secondary | ICD-10-CM

## 2013-12-06 MED ORDER — OXYCODONE-ACETAMINOPHEN 5-325 MG PO TABS: 1.0000 | ORAL_TABLET | Freq: Four times a day (QID) | ORAL | Status: DC | PRN

## 2013-12-06 NOTE — Assessment & Plan Note (Signed)
PTSD: Continue meds as prescribed Keep appointment with provider.

## 2013-12-06 NOTE — Assessment & Plan Note (Signed)
Back pain, chronic: Refill for Percocet 5-325 x 20 Patient signed toxicology agreement Counseled patient one time prescription for narcotics, would need to continue Rx with spine specialist. Referral for spine and sc[facility]osis per insurance requirement  PTSD: Continue meds as prescribed Keep appointment with provider.   ADD: Continue with current medications as prescribed

## 2013-12-06 NOTE — Progress Notes (Signed)
Patient ID: [name] is a 48 y.o. female DOB: [phone] MRN: [id number]     HPI: patient is a 48 year old female who present to the clinic today to establish care. Patient reports history of chronic back pain, had L4-L5 fusion in 2008, has since been diagnosed with a tear to the same area. Follows with Dr[facility] Cristine Polio [facility], has an appointment schedule to review CT and MRI on 12/13/13. States pain is usually controlled well with Percocet 5-325 however she has not had any in a while since they can not see her this calender year until she has a referral from a PCP. Patient reports pain is increasing and radiates down the right leg. No new injury or trauma. Patient also reports history of PTSD and ADD, sees Dr. Dorethea Clan of [facility], well controlled with Xanax, Zoloft and Adderall. Sees Dr. Dorethea Clan every 5 months. Denies other chronic conditions or medications. Denies chest pain/palpitations, cough, SOB, change in bowel/bladder habits, saddle anesthesia, numbness or weakness, HA, visual change/disturbances, pain/diffculty swallowing.  Influenza: declined Tetanus: declined PAP: complete hysterectomy Mammogram: never     ROS: As stated in HPI. All other systems negative  Past Medical History  Diagnosis Date  . PID (acute pelvic inflammatory disease)   . Chlamydia   . Depression   . PTSD (post-traumatic stress disorder)   . ADD (attention deficit disorder with hyperactivity)   . Bilateral ovarian cysts    No family history on file. History   Social History  . Marital Status: Divorced    Spouse Name: N/A    Number of Children: N/A  . Years of Education: N/A   Social History Main Topics  . Smoking status: Current Every Day Smoker -- 0.25 packs/day for 14 years    Types: Cigarettes  . Smokeless tobacco: Not on file  . Alcohol Use: No  . Drug Use: Yes    Special: Cocaine     Comment: Used "20" on Sunday  . Sexual Activity: Yes    Birth Control/ Protection:  Other-see comments     Comment: hysterectomy   Other Topics Concern  . Not on file   Social History Narrative  . No narrative on file   Past Surgical History  Procedure Laterality Date  . Back surgery      dbl lumbar fusion L5-S1  . Bladder surgery    . Abdominal hysterectomy    . Laparoscopy    . Rotator cuff repair Right 02/15/2013   Current Outpatient Prescriptions on File Prior to Visit  Medication Sig Dispense Refill  . ALPRAZolam (XANAX) 1 MG tablet Take 1 tablet (1 mg total) by mouth 3 (three) times daily as needed. anxiety  30 tablet  0  . ibuprofen (ADVIL,MOTRIN) 200 MG tablet Take 800 mg by mouth every 6 (six) hours as needed for pain (pain).      . methocarbamol (ROBAXIN) 500 MG tablet Take 1 tablet (500 mg total) by mouth 2 (two) times daily.  20 tablet  0  . naproxen (NAPROSYN) 500 MG tablet Take 1 tablet (500 mg total) by mouth 2 (two) times daily.  30 tablet  0  . sertraline (ZOLOFT) 100 MG tablet Take 2 tablets (200 mg total) by mouth daily.      . vitamin C (ASCORBIC ACID) 500 MG tablet Take 1,000 mg by mouth daily.       No current facility-administered medications on file prior to visit.   Allergies  Allergen Reactions  .  Imitrex [Sumatriptan Base] Shortness Of Breath  . Clindamycin/Lincomycin Diarrhea    Violently sick-projectile vomiting  . Morphine And Related Nausea And Vomiting  . Vicodin [Hydrocodone-Acetaminophen] Nausea And Vomiting    PE: CONSTITUTIONAL: Well developed, well nourished, pleasant, appears stated age, in NAD HEENT: normocephalic, atraumatic, bilateral ext/int canals normal. Bilateral TM's without injections, bulging, erythema. Nose normal, uvula midline, oropharynx clear and moist. EYES: PERRLA, bilateral EOM and conjunctiva normal NECK: FROM, supple, without thyromegaly or mass CARDIO: RRR, normal S1 and S2, distal pulses intact. PULM/CHEST CTA bilateral, no wheezes, rales or rhonchi. Non tender. ABD: appearance normal, soft,  nontender. Normal bowel sounds x 4 quadrants, no palpable mass noted.  GU: deferred to GYN MUSC: FROM U/LE bilateral, positive straight leg raise bilateral. Bilateral extremities DNVI. LYMPH: no cervical, supraclavicular adenopathy NEURO: alert and oriented x 3, no cranial nerve deficit, motor strength and coordination NL. DTR's intact. Negative romberg. Gait normal. SKIN: warm, dry, no rash or lesions noted. PSYCH: Mood and affect normal, speech normal.   Lab Results  Component Value Date   WBC 7.9 04/04/2013   HGB 14.5 04/04/2013   HCT 41.2 04/04/2013   PLT 369 04/04/2013   GLUCOSE 108* 04/04/2013   ALT 9 04/04/2013   AST 11 04/04/2013   NA 135 04/04/2013   K 3.2* 04/04/2013   CL 100 04/04/2013   CREATININE 0.79 04/04/2013   BUN 10 04/04/2013   CO2 25 04/04/2013   TSH 2.771 02/14/2011   Filed Vitals:   12/06/13 1130  BP: 148/100  Pulse: 88  Temp: 98 F (36.7 C)  Resp: 24   BP repeated by me: 138/88  ASSESSMENT and PLAN   CPX/v70.0 - Patient has been counseled on age-appropriate routine health concerns for screening and prevention. These are reviewed and up-to-date. Immunizations are up-to-date or declined. Labs ordered and will be reviewed.  HM for mammogram Referral for GYN  Back pain, chronic: Refill for Percocet 5-325 x 20 Patient signed toxicology agreement Counseled patient one time prescription for narcotics, would need to continue Rx with spine specialist. Referral for spine and scoliosis per insurance requirement  PTSD: Continue meds as prescribed Keep appointment with provider.   ADD: Continue with current medications as prescribed

## 2013-12-06 NOTE — Patient Instructions (Signed)
It was great to meet you today Ms. Igoe!   Labs have been ordered for you, when you report to lab please be fasting.     Back Pain, Adult Back pain is very common. The pain often gets better over time. The cause of back pain is usually not dangerous. Most people can learn to manage their back pain on their own.  HOME CARE   Stay active. Start with short walks on flat ground if you can. Try to walk farther each day.  Do not sit, drive, or stand in one place for more than 30 minutes. Do not stay in bed.  Do not avoid exercise or work. Activity can help your back heal faster.  Be careful when you bend or lift an object. Bend at your knees, keep the object close to you, and do not twist.  Sleep on a firm mattress. Lie on your side, and bend your knees. If you lie on your back, put a pillow under your knees.  Only take medicines as told by your doctor.  Put ice on the injured area.  Put ice in a plastic bag.  Place a towel between your skin and the bag.  Leave the ice on for 15-20 minutes, 03-04 times a day for the first 2 to 3 days. After that, you can switch between ice and heat packs.  Ask your doctor about back exercises or massage.  Avoid feeling anxious or stressed. Find good ways to deal with stress, such as exercise. GET HELP RIGHT AWAY IF:   Your pain does not go away with rest or medicine.  Your pain does not go away in 1 week.  You have new problems.  You do not feel well.  The pain spreads into your legs.  You cannot control when you poop (bowel movement) or pee (urinate).  Your arms or legs feel weak or lose feeling (numbness).  You feel sick to your stomach (nauseous) or throw up (vomit).  You have belly (abdominal) pain.  You feel like you may pass out (faint). MAKE SURE YOU:   Understand these instructions.  Will watch your condition.  Will get help right away if you are not doing well or get worse. Document Released: 03/30/2008 Document  Revised: 01/04/2012 Document Reviewed: 03/02/2011 [facility] Patient Information 2014 [place].   Health Maintenance, Female A healthy lifestyle and preventative care can promote health and wellness.  Maintain regular health, dental, and eye exams.  Eat a healthy diet. Foods like vegetables, fruits, whole grains, low-fat dairy products, and lean protein foods contain the nutrients you need without too many calories. Decrease your intake of foods high in solid fats, added sugars, and salt. Get information about a proper diet from your caregiver, if necessary.  Regular physical exercise is one of the most important things you can do for your health. Most adults should get at least 150 minutes of moderate-intensity exercise (any activity that increases your heart rate and causes you to sweat) each week. In addition, most adults need muscle-strengthening exercises on 2 or more days a week.   Maintain a healthy weight. The body mass index (BMI) is a screening tool to identify possible weight problems. It provides an estimate of body fat based on height and weight. Your caregiver can help determine your BMI, and can help you achieve or maintain a healthy weight. For adults 20 years and older:  A BMI below 18.5 is considered underweight.  A BMI of 18.5 to 24.9 is  normal.  A BMI of 25 to 29.9 is considered overweight.  A BMI of 30 and above is considered obese.  Maintain normal blood lipids and cholesterol by exercising and minimizing your intake of saturated fat. Eat a balanced diet with plenty of fruits and vegetables. Blood tests for lipids and cholesterol should begin at age 74 and be repeated every 5 years. If your lipid or cholesterol levels are high, you are over 50, or you are a high risk for heart disease, you may need your cholesterol levels checked more frequently.Ongoing high lipid and cholesterol levels should be treated with medicines if diet and exercise are not effective.  If  you smoke, find out from your caregiver how to quit. If you do not use tobacco, do not start.  Lung cancer screening is recommended for adults aged 68 80 years who are at high risk for developing lung cancer because of a history of smoking. Yearly low-dose computed tomography (CT) is recommended for people who have at least a 30-pack-year history of smoking and are a current smoker or have quit within the past 15 years. A pack year of smoking is smoking an average of 1 pack of cigarettes a day for 1 year (for example: 1 pack a day for 30 years or 2 packs a day for 15 years). Yearly screening should continue until the smoker has stopped smoking for at least 15 years. Yearly screening should also be stopped for people who develop a health problem that would prevent them from having lung cancer treatment.  If you are pregnant, do not drink alcohol. If you are breastfeeding, be very cautious about drinking alcohol. If you are not pregnant and choose to drink alcohol, do not exceed 1 drink per day. One drink is considered to be 12 ounces (355 mL) of beer, 5 ounces (148 mL) of wine, or 1.5 ounces (44 mL) of liquor.  Avoid use of street drugs. Do not share needles with anyone. Ask for help if you need support or instructions about stopping the use of drugs.  High blood pressure causes heart disease and increases the risk of stroke. Blood pressure should be checked at least every 1 to 2 years. Ongoing high blood pressure should be treated with medicines, if weight loss and exercise are not effective.  If you are 47 to 46 years old, ask your caregiver if you should take aspirin to prevent strokes.  Diabetes screening involves taking a blood sample to check your fasting blood sugar level. This should be done once every 3 years, after age 44, if you are within normal weight and without risk factors for diabetes. Testing should be considered at a younger age or be carried out more frequently if you are overweight  and have at least 1 risk factor for diabetes.  Breast cancer screening is essential preventative care for women. You should practice "breast self-awareness." This means understanding the normal appearance and feel of your breasts and may include breast self-examination. Any changes detected, no matter how small, should be reported to a caregiver. Women in their 64s and 30s should have a clinical breast exam (CBE) by a caregiver as part of a regular health exam every 1 to 3 years. After age 15, women should have a CBE every year. Starting at age 10, women should consider having a mammogram (breast X-ray) every year. Women who have a family history of breast cancer should talk to their caregiver about genetic screening. Women at a high risk of breast  cancer should talk to their caregiver about having an MRI and a mammogram every year.  Breast cancer gene (BRCA)-related cancer risk assessment is recommended for women who have family members with BRCA-related cancers. BRCA-related cancers include breast, ovarian, tubal, and peritoneal cancers. Having family members with these cancers may be associated with an increased risk for harmful changes (mutations) in the breast cancer genes BRCA1 and BRCA2. Results of the assessment will determine the need for genetic counseling and BRCA1 and BRCA2 testing.  The Pap test is a screening test for cervical cancer. Women should have a Pap test starting at age 41. Between ages 17 and 72, Pap tests should be repeated every 2 years. Beginning at age 75, you should have a Pap test every 3 years as long as the past 3 Pap tests have been normal. If you had a hysterectomy for a problem that was not cancer or a condition that could lead to cancer, then you no longer need Pap tests. If you are between ages 6 and 7, and you have had normal Pap tests going back 10 years, you no longer need Pap tests. If you have had past treatment for cervical cancer or a condition that could lead to  cancer, you need Pap tests and screening for cancer for at least 20 years after your treatment. If Pap tests have been discontinued, risk factors (such as a new sexual partner) need to be reassessed to determine if screening should be resumed. Some women have medical problems that increase the chance of getting cervical cancer. In these cases, your caregiver may recommend more frequent screening and Pap tests.  The human papillomavirus (HPV) test is an additional test that may be used for cervical cancer screening. The HPV test looks for the virus that can cause the cell changes on the cervix. The cells collected during the Pap test can be tested for HPV. The HPV test could be used to screen women aged 59 years and older, and should be used in women of any age who have unclear Pap test results. After the age of 49, women should have HPV testing at the same frequency as a Pap test.  Colorectal cancer can be detected and often prevented. Most routine colorectal cancer screening begins at the age of 19 and continues through age 39. However, your caregiver may recommend screening at an earlier age if you have risk factors for colon cancer. On a yearly basis, your caregiver may provide home test kits to check for hidden blood in the stool. Use of a small camera at the end of a tube, to directly examine the colon (sigmoidoscopy or colonoscopy), can detect the earliest forms of colorectal cancer. Talk to your caregiver about this at age 61, when routine screening begins. Direct examination of the colon should be repeated every 5 to 10 years through age 36, unless early forms of pre-cancerous polyps or small growths are found.  Hepatitis C blood testing is recommended for all people born from [birth year] through 1963 and any individual with known risks for hepatitis C.  Practice safe sex. Use condoms and avoid high-risk sexual practices to reduce the spread of sexually transmitted infections (STIs). Sexually active women  aged 2 and younger should be checked for Chlamydia, which is a common sexually transmitted infection. Older women with new or multiple partners should also be tested for Chlamydia. Testing for other STIs is recommended if you are sexually active and at increased risk.  Osteoporosis is a disease in which  the bones lose minerals and strength with aging. This can result in serious bone fractures. The risk of osteoporosis can be identified using a bone density scan. Women ages 24 and over and women at risk for fractures or osteoporosis should discuss screening with their caregivers. Ask your caregiver whether you should be taking a calcium supplement or vitamin D to reduce the rate of osteoporosis.  Menopause can be associated with physical symptoms and risks. Hormone replacement therapy is available to decrease symptoms and risks. You should talk to your caregiver about whether hormone replacement therapy is right for you.  Use sunscreen. Apply sunscreen liberally and repeatedly throughout the day. You should seek shade when your shadow is shorter than you. Protect yourself by wearing long sleeves, pants, a wide-brimmed hat, and sunglasses year round, whenever you are outdoors.  Notify your caregiver of new moles or changes in moles, especially if there is a change in shape or color. Also notify your caregiver if a mole is larger than the size of a pencil eraser.  Stay current with your immunizations. Document Released: 04/27/2011 Document Revised: 02/06/2013 Document Reviewed: 04/27/2011 [facility] Patient Information 2014 [place].

## 2013-12-06 NOTE — Assessment & Plan Note (Signed)
  Refill for Percocet 5-325 x 20 Patient signed toxicology agreement Counseled patient one time prescription for narcotics, would need to continue Rx with spine specialist. Referral for spine and scoliosis per insurance requirement

## 2013-12-07 ENCOUNTER — Other Ambulatory Visit: Payer: Self-pay | Admitting: Physician Assistant

## 2013-12-07 DIAGNOSIS — Z1231 Encounter for screening mammogram for malignant neoplasm of breast: Secondary | ICD-10-CM

## 2013-12-13 ENCOUNTER — Encounter: Payer: Self-pay | Admitting: Physician Assistant

## 2013-12-21 ENCOUNTER — Ambulatory Visit: Payer: 59

## 2014-01-03 ENCOUNTER — Encounter: Payer: Self-pay | Admitting: Physician Assistant

## 2014-01-18 ENCOUNTER — Ambulatory Visit: Payer: 59

## 2014-01-23 ENCOUNTER — Ambulatory Visit: Payer: 59

## 2014-03-07 ENCOUNTER — Emergency Department (HOSPITAL_COMMUNITY): Payer: 59

## 2014-03-07 ENCOUNTER — Emergency Department (HOSPITAL_COMMUNITY)
Admission: EM | Admit: 2014-03-07 | Discharge: 2014-03-07 | Disposition: A | Discharge status: Home / Self Care | Payer: 59 | Attending: Emergency Medicine | Admitting: Emergency Medicine

## 2014-03-07 ENCOUNTER — Encounter (HOSPITAL_COMMUNITY): Payer: Self-pay | Admitting: Emergency Medicine

## 2014-03-07 DIAGNOSIS — F909 Attention-deficit hyperactivity disorder, unspecified type: Secondary | ICD-10-CM | POA: Insufficient documentation

## 2014-03-07 DIAGNOSIS — Z79899 Other long term (current) drug therapy: Secondary | ICD-10-CM | POA: Insufficient documentation

## 2014-03-07 DIAGNOSIS — F3289 Other specified depressive episodes: Secondary | ICD-10-CM | POA: Insufficient documentation

## 2014-03-07 DIAGNOSIS — M5430 Sciatica, unspecified side: Secondary | ICD-10-CM | POA: Insufficient documentation

## 2014-03-07 DIAGNOSIS — Z87828 Personal history of other (healed) physical injury and trauma: Secondary | ICD-10-CM | POA: Insufficient documentation

## 2014-03-07 DIAGNOSIS — F329 Major depressive disorder, single episode, unspecified: Secondary | ICD-10-CM | POA: Insufficient documentation

## 2014-03-07 DIAGNOSIS — F17200 Nicotine dependence, unspecified, uncomplicated: Secondary | ICD-10-CM | POA: Insufficient documentation

## 2014-03-07 DIAGNOSIS — F4310 Post-traumatic stress disorder, unspecified: Secondary | ICD-10-CM | POA: Insufficient documentation

## 2014-03-07 DIAGNOSIS — Z9889 Other specified postprocedural states: Secondary | ICD-10-CM | POA: Insufficient documentation

## 2014-03-07 DIAGNOSIS — Z8742 Personal history of other diseases of the female genital tract: Secondary | ICD-10-CM | POA: Insufficient documentation

## 2014-03-07 DIAGNOSIS — Z8619 Personal history of other infectious and parasitic diseases: Secondary | ICD-10-CM | POA: Insufficient documentation

## 2014-03-07 HISTORY — DX: Sciatica, unspecified side: M54.30

## 2014-03-07 MED ORDER — OXYCODONE-ACETAMINOPHEN 5-325 MG PO TABS: 1.0000 | ORAL_TABLET | Freq: Four times a day (QID) | ORAL | Status: DC | PRN

## 2014-03-07 MED ORDER — GABAPENTIN 300 MG PO CAPS
300.0000 mg | ORAL_CAPSULE | Freq: Once | ORAL | Status: AC
  Administered 2014-03-07: 300 mg via ORAL
  Filled 2014-03-07: qty 1

## 2014-03-07 MED ORDER — HYDROMORPHONE HCL PF 1 MG/ML IJ SOLN
1.0000 mg | Freq: Once | INTRAMUSCULAR | Status: AC
  Administered 2014-03-07: 1 mg via INTRAMUSCULAR
  Filled 2014-03-07: qty 1

## 2014-03-07 MED ORDER — OXYCODONE-ACETAMINOPHEN 5-325 MG PO TABS
1.0000 | ORAL_TABLET | Freq: Once | ORAL | Status: AC
  Administered 2014-03-07: 1 via ORAL
  Filled 2014-03-07: qty 1

## 2014-03-07 MED ORDER — METHYLPREDNISOLONE (PAK) 4 MG PO TABS: ORAL_TABLET | ORAL | Status: DC

## 2014-03-07 MED ORDER — KETOROLAC TROMETHAMINE 60 MG/2ML IM SOLN
60.0000 mg | Freq: Once | INTRAMUSCULAR | Status: AC
  Administered 2014-03-07: 60 mg via INTRAMUSCULAR
  Filled 2014-03-07: qty 2

## 2014-03-07 NOTE — ED Notes (Signed)
Pt c/o sciatica pain x 1 wk.  Hx of sciatica in the past.  Rt low back, hip pain radiating down leg.

## 2014-03-07 NOTE — ED Provider Notes (Signed)
CSN: [id number]     Arrival date & time 03/07/14  7829 History   First MD Initiated Contact with Patient 03/07/14 [phone]     Chief Complaint  Patient presents with  . Sciatica     (Consider location/radiation/quality/duration/timing/severity/associated sxs/prior Treatment) HPI  This 48 year old female who presents with back pain. Patient reports remote history of injury requiring lumbar surgery. She has a history of sciatica but "it hasn't bothered me in a long time." Patient reports increasing right-sided back pain that radiates down the back of her leg into the right knee. Currently she states her pain is 10 out of 10. She has taken ibuprofen and Tylenol home with no relief. She denies any weakness, numbness, or tingling of the lower extremities. She denies any bowel or bladder issues. She has been ambulatory.  She denies any new injury.  Denies any fever, steroid use, IV drug use.  Past Medical History  Diagnosis Date  . PID (acute pelvic inflammatory disease)   . Chlamydia   . Depression   . PTSD (post-traumatic stress disorder)   . ADD (attention deficit disorder with hyperactivity)   . Bilateral ovarian cysts   . Sciatica    Past Surgical History  Procedure Laterality Date  . Back surgery      dbl lumbar fusion L5-S1  . Bladder surgery    . Abdominal hysterectomy    . Laparoscopy    . Rotator cuff repair Right 02/15/2013   History reviewed. No pertinent family history. History  Substance Use Topics  . Smoking status: Current Every Day Smoker -- 0.25 packs/day for 14 years    Types: Cigarettes  . Smokeless tobacco: Not on file  . Alcohol Use: No   OB History   Grav Para Term Preterm Abortions TAB SAB Ect Mult Living                 Review of Systems  Constitutional: Negative for fever.  Genitourinary: Negative for dysuria.  Musculoskeletal: Positive for back pain. Negative for gait problem.  Neurological: Negative for weakness, numbness and headaches.   Psychiatric/Behavioral: Negative for confusion.  All other systems reviewed and are negative.     Allergies  Imitrex; Clindamycin/lincomycin; Morphine and related; and Vicodin  Home Medications   Prior to Admission medications   Medication Sig Start Date End Date Taking? Authorizing Provider  acetaminophen (TYLENOL) 500 MG tablet Take 500 mg by mouth every 6 (six) hours as needed for mild pain.   Yes Historical Provider, MD  ALPRAZolam Duanne Moron) 1 MG tablet Take 1 tablet (1 mg total) by mouth 3 (three) times daily as needed. anxiety 04/11/13  Yes Elmarie Shiley, NP  amphetamine-dextroamphetamine (ADDERALL) 20 MG tablet Take 20 mg by mouth 3 (three) times daily as needed (for anxiety).   Yes Historical Provider, MD  ibuprofen (ADVIL,MOTRIN) 200 MG tablet Take 800 mg by mouth every 6 (six) hours as needed for pain (pain).   Yes Historical Provider, MD  sertraline (ZOLOFT) 100 MG tablet Take 2 tablets (200 mg total) by mouth daily. 04/11/13  Yes Elmarie Shiley, NP  methylPREDNIsolone (MEDROL DOSPACK) 4 MG tablet follow package directions 03/07/14   Merryl Hacker, MD  oxyCODONE-acetaminophen (PERCOCET/ROXICET) 5-325 MG per tablet Take 1 tablet by mouth every 6 (six) hours as needed for severe pain. 03/07/14   Merryl Hacker, MD   BP 130/85  Pulse 72  Temp(Src) 98.2 F (36.8 C) (Oral)  Resp 16  SpO2 99% Physical Exam  Nursing note and vitals  reviewed. Constitutional: She is oriented to person, place, and time.  Uncomfortable appearing, tearful  HENT:  Head: Normocephalic and atraumatic.  Cardiovascular: Normal rate, regular rhythm and normal heart sounds.   No murmur heard. Pulmonary/Chest: Effort normal and breath sounds normal. No respiratory distress. She has no wheezes.  Musculoskeletal:  Lower lumbar midline scar noted, no tenderness to palpation over the midline of the lumbar spine, positive straight leg raise  Neurological: She is alert and oriented to person, place, and time.   5 out of 5 strength with hip flexion, dorsi and plantar flexion of the bilateral legs, sensation grossly intact, 2+ patellar reflexes bilaterally  Skin: Skin is warm and dry.  Psychiatric: She has a normal mood and affect.    ED Course  Procedures (including critical care time) Labs Review Labs Reviewed - No data to display  Imaging Review Dg Lumbar Spine Complete  03/07/2014   CLINICAL DATA:  Sciatica.  EXAM: LUMBAR SPINE - COMPLETE 4+ VIEW  COMPARISON:  CT scan of November 14, 2013.  FINDINGS: Status post posterior fusion of L4, L5 and S1 with bilateral intrapedicular screw placement. Good alignment of vertebral bodies is noted. Mild degenerative disc disease is noted at L3-4 with minimal anterolisthesis at this level. No fracture is noted.  IMPRESSION: Status post surgical posterior fusion of L4-S1. Good alignment of vertebral bodies is noted. No acute abnormality is seen.   Electronically Signed   By: Sabino Dick M.D.   On: 03/07/2014 10:08     EKG Interpretation None      MDM   Final diagnoses:  Sciatica    Patient presents with history physical exam concerning for acute sciatica. She is uncomfortable appearing. Given history of lumbar fusion, will obtain plain films of the spine. Patient was given IM Toradol and Dilaudid. She reports "some improvement but it still hurts." Patient was subsequently given gabapentin and Percocet. At this time she has no signs or symptoms of cauda equina. Discuss with patient conservative management including burst dose steroids and pain medication. She is to followup with her primary physician. Patient stated understanding.  After history, exam, and medical workup I feel the patient has been appropriately medically screened and is safe for discharge home. Pertinent diagnoses were discussed with the patient. Patient was given return precautions.     Merryl Hacker, MD 03/07/14 [phone]

## 2014-03-07 NOTE — Discharge Instructions (Signed)
Sciatica °Sciatica is pain, weakness, numbness, or tingling along the path of the sciatic nerve. The nerve starts in the lower back and runs down the back of each leg. The nerve controls the muscles in the lower leg and in the back of the knee, while also providing sensation to the back of the thigh, lower leg, and the sole of your foot. Sciatica is a symptom of another medical condition. For instance, nerve damage or certain conditions, such as a herniated disk or bone spur on the spine, pinch or put pressure on the sciatic nerve. This causes the pain, weakness, or other sensations normally associated with sciatica. Generally, sciatica only affects one side of the body. °CAUSES  °· Herniated or slipped disc. °· Degenerative disk disease. °· A pain disorder involving the narrow muscle in the buttocks (piriformis syndrome). °· Pelvic injury or fracture. °· Pregnancy. °· Tumor (rare). °SYMPTOMS  °Symptoms can vary from mild to very severe. The symptoms usually travel from the low back to the buttocks and down the back of the leg. Symptoms can include: °· Mild tingling or dull aches in the lower back, leg, or hip. °· Numbness in the back of the calf or sole of the foot. °· Burning sensations in the lower back, leg, or hip. °· Sharp pains in the lower back, leg, or hip. °· Leg weakness. °· Severe back pain inhibiting movement. °These symptoms may get worse with coughing, sneezing, laughing, or prolonged sitting or standing. Also, being overweight may worsen symptoms. °DIAGNOSIS  °Your caregiver will perform a physical exam to look for common symptoms of sciatica. He or she may ask you to do certain movements or activities that would trigger sciatic nerve pain. Other tests may be performed to find the cause of the sciatica. These may include: °· Blood tests. °· X-rays. °· Imaging tests, such as an MRI or CT scan. °TREATMENT  °Treatment is directed at the cause of the sciatic pain. Sometimes, treatment is not necessary  and the pain and discomfort goes away on its own. If treatment is needed, your caregiver may suggest: °· Over-the-counter medicines to relieve pain. °· Prescription medicines, such as anti-inflammatory medicine, muscle relaxants, or narcotics. °· Applying heat or ice to the painful area. °· Steroid injections to lessen pain, irritation, and inflammation around the nerve. °· Reducing activity during periods of pain. °· Exercising and stretching to strengthen your abdomen and improve flexibility of your spine. Your caregiver may suggest losing weight if the extra weight makes the back pain worse. °· Physical therapy. °· Surgery to eliminate what is pressing or pinching the nerve, such as a bone spur or part of a herniated disk. °HOME CARE INSTRUCTIONS  °· Only take over-the-counter or prescription medicines for pain or discomfort as directed by your caregiver. °· Apply ice to the affected area for 20 minutes, 3 4 times a day for the first 48 72 hours. Then try heat in the same way. °· Exercise, stretch, or perform your usual activities if these do not aggravate your pain. °· Attend physical therapy sessions as directed by your caregiver. °· Keep all follow-up appointments as directed by your caregiver. °· Do not wear high heels or shoes that do not provide proper support. °· Check your mattress to see if it is too soft. A firm mattress may lessen your pain and discomfort. °SEEK IMMEDIATE MEDICAL CARE IF:  °· You lose control of your bowel or bladder (incontinence). °· You have increasing weakness in the lower back,   pelvis, buttocks, or legs. °· You have redness or swelling of your back. °· You have a burning sensation when you urinate. °· You have pain that gets worse when you lie down or awakens you at night. °· Your pain is worse than you have experienced in the past. °· Your pain is lasting longer than 4 weeks. °· You are suddenly losing weight without reason. °MAKE SURE YOU: °· Understand these  instructions. °· Will watch your condition. °· Will get help right away if you are not doing well or get worse. °Document Released: 10/06/2001 Document Revised: 04/12/2012 Document Reviewed: 02/21/2012 °[employer]® Patient Information ©2014 [employer]. ° °

## 2014-03-23 ENCOUNTER — Encounter (HOSPITAL_COMMUNITY): Payer: Self-pay | Admitting: Emergency Medicine

## 2014-03-23 ENCOUNTER — Emergency Department (HOSPITAL_COMMUNITY)
Admission: EM | Admit: 2014-03-23 | Discharge: 2014-03-23 | Disposition: A | Discharge status: Home / Self Care | Payer: 59 | Attending: Emergency Medicine | Admitting: Emergency Medicine

## 2014-03-23 DIAGNOSIS — F988 Other specified behavioral and emotional disorders with onset usually occurring in childhood and adolescence: Secondary | ICD-10-CM | POA: Insufficient documentation

## 2014-03-23 DIAGNOSIS — F329 Major depressive disorder, single episode, unspecified: Secondary | ICD-10-CM | POA: Insufficient documentation

## 2014-03-23 DIAGNOSIS — Z8619 Personal history of other infectious and parasitic diseases: Secondary | ICD-10-CM | POA: Insufficient documentation

## 2014-03-23 DIAGNOSIS — Y929 Unspecified place or not applicable: Secondary | ICD-10-CM | POA: Insufficient documentation

## 2014-03-23 DIAGNOSIS — Z9889 Other specified postprocedural states: Secondary | ICD-10-CM | POA: Insufficient documentation

## 2014-03-23 DIAGNOSIS — M5430 Sciatica, unspecified side: Secondary | ICD-10-CM

## 2014-03-23 DIAGNOSIS — F3289 Other specified depressive episodes: Secondary | ICD-10-CM | POA: Insufficient documentation

## 2014-03-23 DIAGNOSIS — Y9389 Activity, other specified: Secondary | ICD-10-CM | POA: Insufficient documentation

## 2014-03-23 DIAGNOSIS — F17200 Nicotine dependence, unspecified, uncomplicated: Secondary | ICD-10-CM | POA: Insufficient documentation

## 2014-03-23 DIAGNOSIS — Z8742 Personal history of other diseases of the female genital tract: Secondary | ICD-10-CM | POA: Insufficient documentation

## 2014-03-23 DIAGNOSIS — Z79899 Other long term (current) drug therapy: Secondary | ICD-10-CM | POA: Insufficient documentation

## 2014-03-23 DIAGNOSIS — W010XXA Fall on same level from slipping, tripping and stumbling without subsequent striking against object, initial encounter: Secondary | ICD-10-CM | POA: Insufficient documentation

## 2014-03-23 MED ORDER — KETOROLAC TROMETHAMINE 60 MG/2ML IM SOLN
60.0000 mg | Freq: Once | INTRAMUSCULAR | Status: AC
  Administered 2014-03-23: 60 mg via INTRAMUSCULAR
  Filled 2014-03-23: qty 2

## 2014-03-23 MED ORDER — CYCLOBENZAPRINE HCL 10 MG PO TABS: 10.0000 mg | ORAL_TABLET | Freq: Two times a day (BID) | ORAL | Status: DC | PRN

## 2014-03-23 MED ORDER — METHYLPREDNISOLONE 4 MG PO KIT: PACK | ORAL | Status: DC

## 2014-03-23 MED ORDER — OXYCODONE-ACETAMINOPHEN 5-325 MG PO TABS
1.0000 | ORAL_TABLET | Freq: Once | ORAL | Status: AC
  Administered 2014-03-23: 1 via ORAL
  Filled 2014-03-23: qty 1

## 2014-03-23 NOTE — Discharge Instructions (Signed)
Sciatica °Sciatica is pain, weakness, numbness, or tingling along the path of the sciatic nerve. The nerve starts in the lower back and runs down the back of each leg. The nerve controls the muscles in the lower leg and in the back of the knee, while also providing sensation to the back of the thigh, lower leg, and the sole of your foot. Sciatica is a symptom of another medical condition. For instance, nerve damage or certain conditions, such as a herniated disk or bone spur on the spine, pinch or put pressure on the sciatic nerve. This causes the pain, weakness, or other sensations normally associated with sciatica. Generally, sciatica only affects one side of the body. °CAUSES  °· Herniated or slipped disc. °· Degenerative disk disease. °· A pain disorder involving the narrow muscle in the buttocks (piriformis syndrome). °· Pelvic injury or fracture. °· Pregnancy. °· Tumor (rare). °SYMPTOMS  °Symptoms can vary from mild to very severe. The symptoms usually travel from the low back to the buttocks and down the back of the leg. Symptoms can include: °· Mild tingling or dull aches in the lower back, leg, or hip. °· Numbness in the back of the calf or sole of the foot. °· Burning sensations in the lower back, leg, or hip. °· Sharp pains in the lower back, leg, or hip. °· Leg weakness. °· Severe back pain inhibiting movement. °These symptoms may get worse with coughing, sneezing, laughing, or prolonged sitting or standing. Also, being overweight may worsen symptoms. °DIAGNOSIS  °Your caregiver will perform a physical exam to look for common symptoms of sciatica. He or she may ask you to do certain movements or activities that would trigger sciatic nerve pain. Other tests may be performed to find the cause of the sciatica. These may include: °· Blood tests. °· X-rays. °· Imaging tests, such as an MRI or CT scan. °TREATMENT  °Treatment is directed at the cause of the sciatic pain. Sometimes, treatment is not necessary  and the pain and discomfort goes away on its own. If treatment is needed, your caregiver may suggest: °· Over-the-counter medicines to relieve pain. °· Prescription medicines, such as anti-inflammatory medicine, muscle relaxants, or narcotics. °· Applying heat or ice to the painful area. °· Steroid injections to lessen pain, irritation, and inflammation around the nerve. °· Reducing activity during periods of pain. °· Exercising and stretching to strengthen your abdomen and improve flexibility of your spine. Your caregiver may suggest losing weight if the extra weight makes the back pain worse. °· Physical therapy. °· Surgery to eliminate what is pressing or pinching the nerve, such as a bone spur or part of a herniated disk. °HOME CARE INSTRUCTIONS  °· Only take over-the-counter or prescription medicines for pain or discomfort as directed by your caregiver. °· Apply ice to the affected area for 20 minutes, 3 4 times a day for the first 48 72 hours. Then try heat in the same way. °· Exercise, stretch, or perform your usual activities if these do not aggravate your pain. °· Attend physical therapy sessions as directed by your caregiver. °· Keep all follow-up appointments as directed by your caregiver. °· Do not wear high heels or shoes that do not provide proper support. °· Check your mattress to see if it is too soft. A firm mattress may lessen your pain and discomfort. °SEEK IMMEDIATE MEDICAL CARE IF:  °· You lose control of your bowel or bladder (incontinence). °· You have increasing weakness in the lower back,   pelvis, buttocks, or legs. °· You have redness or swelling of your back. °· You have a burning sensation when you urinate. °· You have pain that gets worse when you lie down or awakens you at night. °· Your pain is worse than you have experienced in the past. °· Your pain is lasting longer than 4 weeks. °· You are suddenly losing weight without reason. °MAKE SURE YOU: °· Understand these  instructions. °· Will watch your condition. °· Will get help right away if you are not doing well or get worse. °Document Released: 10/06/2001 Document Revised: 04/12/2012 Document Reviewed: 02/21/2012 °[employer]® Patient Information ©2014 [employer]. ° °

## 2014-03-23 NOTE — ED Notes (Signed)
Pt c/o back pain radiating down R leg after tripping and falling over her dog yesterday.  Pain score 8/10.  Hx of sciatica.  Pt reports that Toradol typically relieves the pain.

## 2014-03-23 NOTE — ED Provider Notes (Signed)
CSN: [id number]     Arrival date & time 03/23/14  1413 History  This chart was scribed for non-physician practitioner Vivi Barrack, PA-C working with Leota Jacobsen, MD by Zettie Pho, ED Scribe. This patient was seen in room WTR7/WTR7 and the patient's care was started at 2:33 PM.    Chief Complaint  Patient presents with  . Fall  . Back Pain   The history is provided by the patient. No language interpreter was used.   HPI Comments: [name] is a 48 y.o. female with a history of sciatica who presents to the Emergency Department complaining of a constant, sharp pain to the lower back that radiates into the right buttocks and down the posterior, right, upper leg onset this morning. Patient reports that she tripped yesterday, causing her to fall on her back onto concrete. She states that the pain is exacerbated with coughing and certain movements. She reports a history of back surgery, but states that she has not followed up recently. Patient reports that she takes 800 mg of ibuprofen three times daily. Patient states that her back pain has been treated effectively with Toradol in the past. She denies weakness, groin numbness, bowel or bladder incontinence. Patient has allergies to Imitrex, clindamycin/lincomycine, morphine and related drugs, and Vicodin. Patient reports that she is a current every day smoker, 0.25 PPD. Patient denies IV drug use or substance abuse.  Patient has no other pertinent medical history.   Past Medical History  Diagnosis Date  . PID (acute pelvic inflammatory disease)   . Chlamydia   . Depression   . PTSD (post-traumatic stress disorder)   . ADD (attention deficit disorder with hyperactivity)   . Bilateral ovarian cysts   . Sciatica    Past Surgical History  Procedure Laterality Date  . Back surgery      dbl lumbar fusion L5-S1  . Bladder surgery    . Abdominal hysterectomy    . Laparoscopy    . Rotator cuff repair Right 02/15/2013   History  reviewed. No pertinent family history. History  Substance Use Topics  . Smoking status: Current Every Day Smoker -- 0.25 packs/day for 14 years    Types: Cigarettes  . Smokeless tobacco: Not on file  . Alcohol Use: No   OB History   Grav Para Term Preterm Abortions TAB SAB Ect Mult Living                 Review of Systems  Constitutional: Negative for fever.  Musculoskeletal: Positive for back pain.  Neurological: Negative for weakness and numbness.  All other systems reviewed and are negative.   Allergies  Imitrex; Clindamycin/lincomycin; Morphine and related; and Vicodin  Home Medications   Prior to Admission medications   Medication Sig Start Date End Date Taking? Authorizing Provider  acetaminophen (TYLENOL) 500 MG tablet Take 500 mg by mouth every 6 (six) hours as needed for mild pain.    Historical Provider, MD  ALPRAZolam Duanne Moron) 1 MG tablet Take 1 tablet (1 mg total) by mouth 3 (three) times daily as needed. anxiety 04/11/13   Elmarie Shiley, NP  amphetamine-dextroamphetamine (ADDERALL) 20 MG tablet Take 20 mg by mouth 3 (three) times daily as needed (for anxiety).    Historical Provider, MD  ibuprofen (ADVIL,MOTRIN) 200 MG tablet Take 800 mg by mouth every 6 (six) hours as needed for pain (pain).    Historical Provider, MD  methylPREDNIsolone (MEDROL DOSPACK) 4 MG tablet follow package directions 03/07/14  Merryl Hacker, MD  oxyCODONE-acetaminophen (PERCOCET/ROXICET) 5-325 MG per tablet Take 1 tablet by mouth every 6 (six) hours as needed for severe pain. 03/07/14   Merryl Hacker, MD  sertraline (ZOLOFT) 100 MG tablet Take 2 tablets (200 mg total) by mouth daily. 04/11/13   Elmarie Shiley, NP   Triage Vitals: BP 138/96  Pulse 88  Temp(Src) 98.2 F (36.8 C)  Resp 22  SpO2 100%  Physical Exam  Nursing note and vitals reviewed. Constitutional: She is oriented to person, place, and time. She appears well-developed and well-nourished. No distress.  HENT:  Head:  Normocephalic and atraumatic.  Eyes: Conjunctivae are normal.  Neck: Normal range of motion. Neck supple.  Pulmonary/Chest: Effort normal. No respiratory distress.  Abdominal: She exhibits no distension.  Musculoskeletal: Normal range of motion.  Neurological: She is alert and oriented to person, place, and time. She has normal reflexes. She displays normal reflexes.  Reflex Scores:      Patellar reflexes are 2+ on the right side and 2+ on the left side.      Achilles reflexes are 2+ on the right side and 2+ on the left side. Antalgic gait, but without ataxia. Normal sensation and symmetric strength against resistance of bilateral upper and lower extremities.   Skin: Skin is warm and dry.  Psychiatric: She has a normal mood and affect. Her behavior is normal.    ED Course  Procedures (including critical care time)  DIAGNOSTIC STUDIES: Oxygen Saturation is 100% on room air, normal by my interpretation.    COORDINATION OF CARE: 2:43 PM- Will order Toradol to manage symptoms. Will discharge patient with Mobic and a muscle relaxant. Advised patient to follow up with the referral. Advised of smoking cessation. Discussed treatment plan with patient at bedside and patient verbalized agreement.     Labs Review Labs Reviewed - No data to display  Imaging Review No results found.   EKG Interpretation None      MDM   Final diagnoses:  Sciatica   Patient presents to the ED with similar back pain and sciatica symptoms as 03/07/14.  Symptoms recur today after a fall.  Patient has not followed up with either her PCP or her spine surgeon since her sciatica flare on 03/07/14.  There are no signs or symptoms concerning for cauda equina at this time and patient denies a history of IV drug use.  Physical exam shows no focal neural deficits.  I have treated the patient with an IM injection of ketorolac 60 mg and an oxycodone 5/325 here in the ED.  I will send the patient home with a medrol  dosepack and flexeril.  I don't feel that narcotics are an appropriate home medication at this time.  Patient endorses understanding that the flexeril can make her sleepy and that she cannot drive or operate heavy machinery on this medication.  Patient was given strict follow-up instructions to return if she experiences loss of bowel or bladder or saddle anesthesia's as this can be a surgical emergency.    I personally performed the services described in this documentation, which was scribed in my presence. The recorded information has been reviewed and is accurate.     Kenard Gower, PA-C 03/23/14 1541

## 2014-03-24 NOTE — ED Provider Notes (Signed)
Medical screening examination/treatment/procedure(s) were performed by non-physician practitioner and as supervising physician I was immediately available for consultation/collaboration.   Hanae Waiters T Illeana Edick, MD 03/24/14 1626 

## 2014-06-02 ENCOUNTER — Emergency Department (HOSPITAL_COMMUNITY)
Admission: EM | Admit: 2014-06-02 | Discharge: 2014-06-02 | Disposition: A | Discharge status: Home / Self Care | Payer: 59 | Attending: Emergency Medicine | Admitting: Emergency Medicine

## 2014-06-02 ENCOUNTER — Encounter (HOSPITAL_COMMUNITY): Payer: Self-pay | Admitting: Emergency Medicine

## 2014-06-02 DIAGNOSIS — Z79899 Other long term (current) drug therapy: Secondary | ICD-10-CM | POA: Insufficient documentation

## 2014-06-02 DIAGNOSIS — F329 Major depressive disorder, single episode, unspecified: Secondary | ICD-10-CM | POA: Insufficient documentation

## 2014-06-02 DIAGNOSIS — F909 Attention-deficit hyperactivity disorder, unspecified type: Secondary | ICD-10-CM | POA: Insufficient documentation

## 2014-06-02 DIAGNOSIS — Z8742 Personal history of other diseases of the female genital tract: Secondary | ICD-10-CM | POA: Insufficient documentation

## 2014-06-02 DIAGNOSIS — Z8619 Personal history of other infectious and parasitic diseases: Secondary | ICD-10-CM | POA: Insufficient documentation

## 2014-06-02 DIAGNOSIS — R609 Edema, unspecified: Secondary | ICD-10-CM | POA: Insufficient documentation

## 2014-06-02 DIAGNOSIS — M7989 Other specified soft tissue disorders: Secondary | ICD-10-CM | POA: Diagnosis present

## 2014-06-02 DIAGNOSIS — F17200 Nicotine dependence, unspecified, uncomplicated: Secondary | ICD-10-CM | POA: Diagnosis not present

## 2014-06-02 DIAGNOSIS — F4310 Post-traumatic stress disorder, unspecified: Secondary | ICD-10-CM | POA: Diagnosis not present

## 2014-06-02 DIAGNOSIS — Z9889 Other specified postprocedural states: Secondary | ICD-10-CM | POA: Insufficient documentation

## 2014-06-02 DIAGNOSIS — E669 Obesity, unspecified: Secondary | ICD-10-CM | POA: Insufficient documentation

## 2014-06-02 DIAGNOSIS — F3289 Other specified depressive episodes: Secondary | ICD-10-CM | POA: Diagnosis not present

## 2014-06-02 DIAGNOSIS — R600 Localized edema: Secondary | ICD-10-CM

## 2014-06-02 LAB — CBC WITH DIFFERENTIAL/PLATELET
BASOS ABS: 0 10*3/uL (ref 0.0–0.1)
Basophils Relative: 0 % (ref 0–1)
EOS ABS: 0.7 10*3/uL (ref 0.0–0.7)
EOS PCT: 7 % — AB (ref 0–5)
HCT: 30.6 % — ABNORMAL LOW (ref 36.0–46.0)
Hemoglobin: 10.5 g/dL — ABNORMAL LOW (ref 12.0–15.0)
LYMPHS PCT: 25 % (ref 12–46)
Lymphs Abs: 2.3 10*3/uL (ref 0.7–4.0)
MCH: 31 pg (ref 26.0–34.0)
MCHC: 34.3 g/dL (ref 30.0–36.0)
MCV: 90.3 fL (ref 78.0–100.0)
Monocytes Absolute: 0.7 10*3/uL (ref 0.1–1.0)
Monocytes Relative: 8 % (ref 3–12)
NEUTROS PCT: 60 % (ref 43–77)
Neutro Abs: 5.7 10*3/uL (ref 1.7–7.7)
PLATELETS: 559 10*3/uL — AB (ref 150–400)
RBC: 3.39 MIL/uL — AB (ref 3.87–5.11)
RDW: 12.8 % (ref 11.5–15.5)
WBC: 9.4 10*3/uL (ref 4.0–10.5)

## 2014-06-02 LAB — URINALYSIS, ROUTINE W REFLEX MICROSCOPIC
BILIRUBIN URINE: NEGATIVE
GLUCOSE, UA: NEGATIVE mg/dL
HGB URINE DIPSTICK: NEGATIVE
Ketones, ur: NEGATIVE mg/dL
Leukocytes, UA: NEGATIVE
Nitrite: NEGATIVE
PH: 6 (ref 5.0–8.0)
Protein, ur: NEGATIVE mg/dL
SPECIFIC GRAVITY, URINE: 1.012 (ref 1.005–1.030)
Urobilinogen, UA: 0.2 mg/dL (ref 0.0–1.0)

## 2014-06-02 LAB — COMPREHENSIVE METABOLIC PANEL
ALK PHOS: 153 U/L — AB (ref 39–117)
ALT: 20 U/L (ref 0–35)
AST: 25 U/L (ref 0–37)
Albumin: 2.9 g/dL — ABNORMAL LOW (ref 3.5–5.2)
Anion gap: 11 (ref 5–15)
BUN: 8 mg/dL (ref 6–23)
CALCIUM: 8.8 mg/dL (ref 8.4–10.5)
CO2: 27 mEq/L (ref 19–32)
Chloride: 96 mEq/L (ref 96–112)
Creatinine, Ser: 0.8 mg/dL (ref 0.50–1.10)
GFR calc Af Amer: >90 mL/min (ref >90)
GFR calc non Af Amer: 88 mL/min — ABNORMAL LOW (ref >90)
Glucose, Bld: 110 mg/dL — ABNORMAL HIGH (ref 70–99)
POTASSIUM: 3.9 meq/L (ref 3.7–5.3)
SODIUM: 134 meq/L — AB (ref 137–147)
TOTAL PROTEIN: 6.9 g/dL (ref 6.0–8.3)
Total Bilirubin: <0.2 mg/dL — ABNORMAL LOW (ref 0.3–1.2)

## 2014-06-02 MED ORDER — FUROSEMIDE 20 MG PO TABS: 20.0000 mg | ORAL_TABLET | Freq: Every day | ORAL | Status: DC

## 2014-06-02 MED ORDER — HYDROMORPHONE HCL PF 1 MG/ML IJ SOLN
1.0000 mg | Freq: Once | INTRAMUSCULAR | Status: AC
  Administered 2014-06-02: 1 mg via INTRAVENOUS
  Filled 2014-06-02: qty 1

## 2014-06-02 MED ORDER — OXYCODONE-ACETAMINOPHEN 5-325 MG PO TABS
2.0000 | ORAL_TABLET | Freq: Once | ORAL | Status: AC
  Administered 2014-06-02: 2 via ORAL
  Filled 2014-06-02: qty 2

## 2014-06-02 NOTE — Discharge Instructions (Signed)
Peripheral Edema You have swelling in your legs (peripheral edema). This swelling is due to excess accumulation of salt and water in your body. Edema may be a sign of heart, kidney or liver disease, or a side effect of a medication. It may also be due to problems in the leg veins. Elevating your legs and using special support stockings may be very helpful, if the cause of the swelling is due to poor venous circulation. Avoid long periods of standing, whatever the cause. Treatment of edema depends on identifying the cause. Chips, pretzels, pickles and other salty foods should be avoided. Restricting salt in your diet is almost always needed. Water pills (diuretics) are often used to remove the excess salt and water from your body via urine. These medicines prevent the kidney from reabsorbing sodium. This increases urine flow. Diuretic treatment may also result in lowering of potassium levels in your body. Potassium supplements may be needed if you have to use diuretics daily. Daily weights can help you keep track of your progress in clearing your edema. You should call your caregiver for follow up care as recommended. SEEK IMMEDIATE MEDICAL CARE IF:   You have increased swelling, pain, redness, or heat in your legs.  You develop shortness of breath, especially when lying down.  You develop chest or abdominal pain, weakness, or fainting.  You have a fever. Document Released: 11/19/2004 Document Revised: 01/04/2012 Document Reviewed: 10/30/2009 [facility] Patient Information 2015 [place], [place]. This information is not intended to replace advice given to you by your health care provider. Make sure you discuss any questions you have with your health care provider.     Edema Edema is an abnormal buildup of fluids in your bodytissues. Edema is somewhatdependent on gravity to pull the fluid to the lowest place in your body. That makes the condition more common in the legs and thighs (lower  extremities). Painless swelling of the feet and ankles is common and becomes more likely as you get older. It is also common in looser tissues, like around your eyes.  When the affected area is squeezed, the fluid may move out of that spot and leave a dent for a few moments. This dent is called pitting.  CAUSES  There are many possible causes of edema. Eating too much salt and being on your feet or sitting for a long time can cause edema in your legs and ankles. Hot weather may make edema worse. Common medical causes of edema include:  Heart failure.  Liver disease.  Kidney disease.  Weak blood vessels in your legs.  Cancer.  An injury.  Pregnancy.  Some medications.  Obesity. SYMPTOMS  Edema is usually painless.Your skin may look swollen or shiny.  DIAGNOSIS  Your health care provider may be able to diagnose edema by asking about your medical history and doing a physical exam. You may need to have tests such as X-rays, an electrocardiogram, or blood tests to check for medical conditions that may cause edema.  TREATMENT  Edema treatment depends on the cause. If you have heart, liver, or kidney disease, you need the treatment appropriate for these conditions. General treatment may include:  Elevation of the affected body part above the level of your heart.  Compression of the affected body part. Pressure from elastic bandages or support stockings squeezes the tissues and forces fluid back into the blood vessels. This keeps fluid from entering the tissues.  Restriction of fluid and salt intake.  Use of a water pill (diuretic).  appropriate only for some types of edema. They pull fluid out of your body and make you urinate more often. This gets rid of fluid and reduces swelling, but diuretics can have side effects. Only use diuretics as directed by your health care provider. HOME CARE INSTRUCTIONS   Keep the affected body part above the level of your heart when you are  lying down.   Do not sit still or stand for prolonged periods.   Do not put anything directly under your knees when lying down.  Do not wear constricting clothing or garters on your upper legs.   Exercise your legs to work the fluid back into your blood vessels. This may help the swelling go down.   Wear elastic bandages or support stockings to reduce ankle swelling as directed by your health care provider.   Eat a low-salt diet to reduce fluid if your health care provider recommends it.   Only take medicines as directed by your health care provider. SEEK MEDICAL CARE IF:   Your edema is not responding to treatment.  You have heart, liver, or kidney disease and notice symptoms of edema.  You have edema in your legs that does not improve after elevating them.   You have sudden and unexplained weight gain. SEEK IMMEDIATE MEDICAL CARE IF:   You develop shortness of breath or chest pain.   You cannot breathe when you lie down.  You develop pain, redness, or warmth in the swollen areas.   You have heart, liver, or kidney disease and suddenly get edema.  You have a fever and your symptoms suddenly get worse. MAKE SURE YOU:   Understand these instructions.  Will watch your condition.  Will get help right away if you are not doing well or get worse. Document Released: 10/12/2005 Document Revised: 02/26/2014 Document Reviewed: 08/04/2013 [employer] Patient Information 2015 [employer]. This information is not intended to replace advice given to you by your health care provider. Make sure you discuss any questions you have with your health care provider.  

## 2014-06-02 NOTE — ED Provider Notes (Signed)
CSN: [id number]     Arrival date & time 06/02/14  2000 History   First MD Initiated Contact with Patient 06/02/14 2023     Chief Complaint  Patient presents with  . Leg Swelling  . Post-op Problem     (Consider location/radiation/quality/duration/timing/severity/associated sxs/prior Treatment) HPI 48 year old female presents with one week of worsening leg swelling. Leg swelling is bilateral. Started in her feet and ankles has progressively moved up to her knees. This is associated with 8/10 pain that she feels like a pressure like her legs are going to pop. The patient never had this problem before. She's having no difficulty urinating. No chest pain, shortness of breath, orthopnea. No fevers. This started a couple days after having back surgery at the end of July. No abdominal pain or swelling.  Past Medical History  Diagnosis Date  . PID (acute pelvic inflammatory disease)   . Chlamydia   . Depression   . PTSD (post-traumatic stress disorder)   . ADD (attention deficit disorder with hyperactivity)   . Bilateral ovarian cysts   . Sciatica    Past Surgical History  Procedure Laterality Date  . Back surgery      dbl lumbar fusion L5-S1  . Bladder surgery    . Abdominal hysterectomy    . Laparoscopy    . Rotator cuff repair Right 02/15/2013   History reviewed. No pertinent family history. History  Substance Use Topics  . Smoking status: Current Every Day Smoker -- 0.25 packs/day for 14 years    Types: Cigarettes  . Smokeless tobacco: Not on file  . Alcohol Use: No   OB History   Grav Para Term Preterm Abortions TAB SAB Ect Mult Living                 Review of Systems  Constitutional: Negative for fever.  Respiratory: Negative for shortness of breath.   Cardiovascular: Positive for leg swelling. Negative for chest pain.  Gastrointestinal: Negative for vomiting.  Genitourinary: Negative for dysuria, decreased urine volume and difficulty urinating.  Musculoskeletal:  Positive for back pain (improved since surgery).  All other systems reviewed and are negative.     Allergies  Imitrex; Clindamycin/lincomycin; Morphine and related; and Vicodin  Home Medications   Prior to Admission medications   Medication Sig Start Date End Date Taking? Authorizing Provider  ALPRAZolam Duanne Moron) 1 MG tablet Take 1 tablet (1 mg total) by mouth 3 (three) times daily as needed. anxiety 04/11/13  Yes Elmarie Shiley, NP  amphetamine-dextroamphetamine (ADDERALL) 20 MG tablet Take 20 mg by mouth 3 (three) times daily as needed (for anxiety).   Yes Historical Provider, MD  cyclobenzaprine (FLEXERIL) 10 MG tablet Take 1 tablet (10 mg total) by mouth 2 (two) times daily as needed for muscle spasms. 03/23/14  Yes Courtney A Forcucci, PA-C  oxyCODONE-acetaminophen (PERCOCET) 10-325 MG per tablet Take 1 tablet by mouth every 4 (four) hours as needed for pain.   Yes Historical Provider, MD  sertraline (ZOLOFT) 100 MG tablet Take 2 tablets (200 mg total) by mouth daily. 04/11/13  Yes Elmarie Shiley, NP  traMADol (ULTRAM) 50 MG tablet Take 50 mg by mouth every 6 (six) hours as needed.   Yes Historical Provider, MD   BP 118/67  Pulse 85  Temp(Src) 97.7 F (36.5 C) (Oral)  Resp 18  SpO2 97% Physical Exam  Nursing note and vitals reviewed. Constitutional: She is oriented to person, place, and time. She appears well-developed and well-nourished.  obese  HENT:  Head: Normocephalic and atraumatic.  Right Ear: External ear normal.  Left Ear: External ear normal.  Nose: Nose normal.  Eyes: Right eye exhibits no discharge. Left eye exhibits no discharge.  Neck: Neck supple.  Cardiovascular: Normal rate, regular rhythm and normal heart sounds.   Pulmonary/Chest: Effort normal and breath sounds normal. She has no wheezes. She has no rales.  Abdominal: Soft. She exhibits no distension. There is no tenderness.  Musculoskeletal:  Bilateral lower extremities with symmetric, diffuse swelling from  feet to just beneath knees. Moderate, diffuse tenderness bilaterally. Non-pitting edema. No warmth or cellulitis.  Neurological: She is alert and oriented to person, place, and time.  Skin: Skin is warm and dry.    ED Course  Procedures (including critical care time) Labs Review Labs Reviewed  CBC WITH DIFFERENTIAL - Abnormal; Notable for the following:    RBC 3.39 (*)    Hemoglobin 10.5 (*)    HCT 30.6 (*)    Platelets 559 (*)    Eosinophils Relative 7 (*)    All other components within normal limits  COMPREHENSIVE METABOLIC PANEL - Abnormal; Notable for the following:    Sodium 134 (*)    Glucose, Bld 110 (*)    Albumin 2.9 (*)    Alkaline Phosphatase 153 (*)    Total Bilirubin <0.2 (*)    GFR calc non Af Amer 88 (*)    All other components within normal limits  URINALYSIS, ROUTINE W REFLEX MICROSCOPIC    Imaging Review No results found.   EKG Interpretation None      MDM   Final diagnoses:  Bilateral leg edema    Patient with bilateral lower extremity swelling of uncertain etiology. Unlikely to be DVT given the bilateral symptoms. No evidence of CHF given that she has no chest pain, shortness of breath, orthopnea. Renal function is normal. Mildly low albumin, uncertain if this is contributing. No protein in her urine. At this time, will DC with small dose of Lasix and close followup with her PCP. Given that she is postop, I still feel she is still low risk for DVT but we'll have her return tomorrow for ultrasound this is not available at this time of night in our ER. Advised of strict return precautions.    Ephraim Hamburger, MD 06/02/14 [phone]

## 2014-06-02 NOTE — ED Notes (Signed)
Pt arrived to the ED with a complaint of leg edema.  Pt had back surgery on 05/22/2014.  Since then pt has progressively gotten more swollen as the week has gone on.  Pt told by on call Doctor that she needed to be evaluated.

## 2014-06-02 NOTE — ED Notes (Signed)
Pt unable to urinate at this time.  

## 2014-06-03 ENCOUNTER — Ambulatory Visit (HOSPITAL_COMMUNITY): Payer: 59 | Attending: Emergency Medicine

## 2015-01-09 ENCOUNTER — Emergency Department (HOSPITAL_COMMUNITY)
Admission: EM | Admit: 2015-01-09 | Discharge: 2015-01-09 | Disposition: A | Discharge status: Home / Self Care | Payer: 59 | Attending: Emergency Medicine | Admitting: Emergency Medicine

## 2015-01-09 ENCOUNTER — Encounter (HOSPITAL_COMMUNITY): Payer: Self-pay | Admitting: *Deleted

## 2015-01-09 DIAGNOSIS — F329 Major depressive disorder, single episode, unspecified: Secondary | ICD-10-CM | POA: Insufficient documentation

## 2015-01-09 DIAGNOSIS — X58XXXA Exposure to other specified factors, initial encounter: Secondary | ICD-10-CM | POA: Insufficient documentation

## 2015-01-09 DIAGNOSIS — F4310 Post-traumatic stress disorder, unspecified: Secondary | ICD-10-CM | POA: Insufficient documentation

## 2015-01-09 DIAGNOSIS — Z720 Tobacco use: Secondary | ICD-10-CM | POA: Insufficient documentation

## 2015-01-09 DIAGNOSIS — Y9389 Activity, other specified: Secondary | ICD-10-CM | POA: Insufficient documentation

## 2015-01-09 DIAGNOSIS — Y9289 Other specified places as the place of occurrence of the external cause: Secondary | ICD-10-CM | POA: Insufficient documentation

## 2015-01-09 DIAGNOSIS — Z9889 Other specified postprocedural states: Secondary | ICD-10-CM | POA: Insufficient documentation

## 2015-01-09 DIAGNOSIS — Z79899 Other long term (current) drug therapy: Secondary | ICD-10-CM | POA: Insufficient documentation

## 2015-01-09 DIAGNOSIS — F909 Attention-deficit hyperactivity disorder, unspecified type: Secondary | ICD-10-CM | POA: Insufficient documentation

## 2015-01-09 DIAGNOSIS — Y998 Other external cause status: Secondary | ICD-10-CM | POA: Insufficient documentation

## 2015-01-09 DIAGNOSIS — S39012A Strain of muscle, fascia and tendon of lower back, initial encounter: Secondary | ICD-10-CM | POA: Insufficient documentation

## 2015-01-09 DIAGNOSIS — Z8742 Personal history of other diseases of the female genital tract: Secondary | ICD-10-CM | POA: Insufficient documentation

## 2015-01-09 MED ORDER — OXYCODONE-ACETAMINOPHEN 5-325 MG PO TABS
2.0000 | ORAL_TABLET | Freq: Once | ORAL | Status: AC
  Administered 2015-01-09: 2 via ORAL
  Filled 2015-01-09: qty 2

## 2015-01-09 MED ORDER — CYCLOBENZAPRINE HCL 10 MG PO TABS: 10.0000 mg | ORAL_TABLET | Freq: Two times a day (BID) | ORAL | Status: DC | PRN

## 2015-01-09 NOTE — Discharge Instructions (Signed)
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
## 2015-01-23 ENCOUNTER — Emergency Department (HOSPITAL_COMMUNITY): Payer: 59

## 2015-01-23 ENCOUNTER — Emergency Department (HOSPITAL_COMMUNITY)
Admission: EM | Admit: 2015-01-23 | Discharge: 2015-01-23 | Disposition: A | Discharge status: Home / Self Care | Payer: 59 | Attending: Emergency Medicine | Admitting: Emergency Medicine

## 2015-01-23 ENCOUNTER — Encounter (HOSPITAL_COMMUNITY): Payer: Self-pay | Admitting: Emergency Medicine

## 2015-01-23 DIAGNOSIS — Z8739 Personal history of other diseases of the musculoskeletal system and connective tissue: Secondary | ICD-10-CM | POA: Insufficient documentation

## 2015-01-23 DIAGNOSIS — Z8619 Personal history of other infectious and parasitic diseases: Secondary | ICD-10-CM | POA: Insufficient documentation

## 2015-01-23 DIAGNOSIS — Y998 Other external cause status: Secondary | ICD-10-CM | POA: Insufficient documentation

## 2015-01-23 DIAGNOSIS — Y9389 Activity, other specified: Secondary | ICD-10-CM | POA: Insufficient documentation

## 2015-01-23 DIAGNOSIS — F329 Major depressive disorder, single episode, unspecified: Secondary | ICD-10-CM | POA: Insufficient documentation

## 2015-01-23 DIAGNOSIS — E669 Obesity, unspecified: Secondary | ICD-10-CM | POA: Insufficient documentation

## 2015-01-23 DIAGNOSIS — Z87448 Personal history of other diseases of urinary system: Secondary | ICD-10-CM | POA: Insufficient documentation

## 2015-01-23 DIAGNOSIS — Z79899 Other long term (current) drug therapy: Secondary | ICD-10-CM | POA: Insufficient documentation

## 2015-01-23 DIAGNOSIS — S8992XA Unspecified injury of left lower leg, initial encounter: Secondary | ICD-10-CM | POA: Insufficient documentation

## 2015-01-23 DIAGNOSIS — Z720 Tobacco use: Secondary | ICD-10-CM | POA: Insufficient documentation

## 2015-01-23 DIAGNOSIS — M25562 Pain in left knee: Secondary | ICD-10-CM

## 2015-01-23 DIAGNOSIS — Y9289 Other specified places as the place of occurrence of the external cause: Secondary | ICD-10-CM | POA: Insufficient documentation

## 2015-01-23 MED ORDER — TRAMADOL HCL 50 MG PO TABS: 50.0000 mg | ORAL_TABLET | Freq: Four times a day (QID) | ORAL | Status: DC | PRN

## 2015-01-23 MED ORDER — OXYCODONE-ACETAMINOPHEN 5-325 MG PO TABS
1.0000 | ORAL_TABLET | Freq: Once | ORAL | Status: AC
  Administered 2015-01-23: 1 via ORAL
  Filled 2015-01-23: qty 1
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
## 2015-01-23 NOTE — Discharge Instructions (Signed)
Take the prescribed medication as directed. Follow-up with Dr. Percell Miller-- call to schedule appt for follow-up. Return to the ED for new or worsening symptoms.
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
## 2015-01-23 NOTE — ED Notes (Signed)
Pain medication not administered at this time due to patient sleeping heavily, snoring, not arousing to quiet noses in room.

## 2015-01-23 NOTE — ED Notes (Signed)
Patient was educated not to drive, operate heavy machinery, or drink alcohol while taking narcotic medication.  

## 2015-02-02 ENCOUNTER — Emergency Department (HOSPITAL_COMMUNITY): Payer: 59

## 2015-02-02 ENCOUNTER — Encounter (HOSPITAL_COMMUNITY): Payer: Self-pay | Admitting: Emergency Medicine

## 2015-02-02 ENCOUNTER — Emergency Department (HOSPITAL_COMMUNITY)
Admission: EM | Admit: 2015-02-02 | Discharge: 2015-02-02 | Disposition: A | Discharge status: Home / Self Care | Payer: 59 | Attending: Emergency Medicine | Admitting: Emergency Medicine

## 2015-02-02 DIAGNOSIS — Z791 Long term (current) use of non-steroidal anti-inflammatories (NSAID): Secondary | ICD-10-CM | POA: Insufficient documentation

## 2015-02-02 DIAGNOSIS — F4310 Post-traumatic stress disorder, unspecified: Secondary | ICD-10-CM | POA: Insufficient documentation

## 2015-02-02 DIAGNOSIS — M1712 Unilateral primary osteoarthritis, left knee: Secondary | ICD-10-CM | POA: Insufficient documentation

## 2015-02-02 DIAGNOSIS — Z79899 Other long term (current) drug therapy: Secondary | ICD-10-CM | POA: Insufficient documentation

## 2015-02-02 DIAGNOSIS — Z720 Tobacco use: Secondary | ICD-10-CM | POA: Insufficient documentation

## 2015-02-02 DIAGNOSIS — Z8619 Personal history of other infectious and parasitic diseases: Secondary | ICD-10-CM | POA: Insufficient documentation

## 2015-02-02 DIAGNOSIS — M25562 Pain in left knee: Secondary | ICD-10-CM

## 2015-02-02 DIAGNOSIS — Z8742 Personal history of other diseases of the female genital tract: Secondary | ICD-10-CM | POA: Insufficient documentation

## 2015-02-02 DIAGNOSIS — F329 Major depressive disorder, single episode, unspecified: Secondary | ICD-10-CM | POA: Insufficient documentation

## 2015-02-02 MED ORDER — OXYCODONE-ACETAMINOPHEN 5-325 MG PO TABS: 1.0000 | ORAL_TABLET | ORAL | Status: DC | PRN

## 2015-02-02 MED ORDER — OXYCODONE-ACETAMINOPHEN 5-325 MG PO TABS
1.0000 | ORAL_TABLET | Freq: Once | ORAL | Status: AC
  Administered 2015-02-02: 1 via ORAL
  Filled 2015-02-02: qty 1
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
## 2015-02-02 NOTE — ED Notes (Signed)
Edwin Cap, case management RN called and message left on voicemail. Waiting for call to be returned.
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
## 2015-02-02 NOTE — ED Notes (Addendum)
Pt states she was seen here in March for multiple complaints. Says they found fluid on her left knee then and hasn't been able to get in to see a orthopedist. Says she had to chase her dog around this morning and has aggravated it. Pt crying in triage about the loss of her job and frustrations over her knee. She says her old doctor said she may have torn the cartilage in her knee

## 2015-02-02 NOTE — Discharge Instructions (Signed)
Return to the emergency room with worsening of symptoms, new symptoms or with symptoms that are concerning, especially fevers, redness, swelling, numbness, tingling, weakness. RICE: Rest, Ice (three cycles of 20 mins on, 12mins off at least twice a day), compression/brace, elevation. Heating pad works well for back pain. Ibuprofen 400mg  (2 tablets 200mg ) every 5-6 hours for 3-5 days. Follow up with PCP. Call to make appointment with [facility]. Number above. Follow up with orthopedics for persistent pain. Read below information and follow recommendations. Osteoarthritis Osteoarthritis is a disease that causes soreness and inflammation of a joint. It occurs when the cartilage at the affected joint wears down. Cartilage acts as a cushion, covering the ends of bones where they meet to form a joint. Osteoarthritis is the most common form of arthritis. It often occurs in older people. The joints affected most often by this condition include those in the:  Ends of the fingers.  Thumbs.  Neck.  Lower back.  Knees.  Hips. CAUSES  Over time, the cartilage that covers the ends of bones begins to wear away. This causes bone to rub on bone, producing pain and stiffness in the affected joints.  RISK FACTORS Certain factors can increase your chances of having osteoarthritis, including:  Older age.  Excessive body weight.  Overuse of joints.  Previous joint injury. SIGNS AND SYMPTOMS   Pain, swelling, and stiffness in the joint.  Over time, the joint may lose its normal shape.  Small deposits of bone (osteophytes) may grow on the edges of the joint.  Bits of bone or cartilage can break off and float inside the joint space. This may cause more pain and damage. DIAGNOSIS  Your health care provider will do a physical exam and ask about your symptoms. Various tests may be ordered, such as:  X-rays of the affected joint.  An MRI scan.  Blood tests to rule out other  types of arthritis.  Joint fluid tests. This involves using a needle to draw fluid from the joint and examining the fluid under a microscope. TREATMENT  Goals of treatment are to control pain and improve joint function. Treatment plans may include:  A prescribed exercise program that allows for rest and joint relief.  A weight control plan.  Pain relief techniques, such as:  Properly applied heat and cold.  Electric pulses delivered to nerve endings under the skin (transcutaneous electrical nerve stimulation [TENS]).  Massage.  Certain nutritional supplements.  Medicines to control pain, such as:  Acetaminophen.  Nonsteroidal anti-inflammatory drugs (NSAIDs), such as naproxen.  Narcotic or central-acting agents, such as tramadol.  Corticosteroids. These can be given orally or as an injection.  Surgery to reposition the bones and relieve pain (osteotomy) or to remove loose pieces of bone and cartilage. Joint replacement may be needed in advanced states of osteoarthritis. HOME CARE INSTRUCTIONS   Take medicines only as directed by your health care provider.  Maintain a healthy weight. Follow your health care provider's instructions for weight control. This may include dietary instructions.  Exercise as directed. Your health care provider can recommend specific types of exercise. These may include:  Strengthening exercises. These are done to strengthen the muscles that support joints affected by arthritis. They can be performed with weights or with exercise bands to add resistance.  Aerobic activities. These are exercises, such as brisk walking or low-impact aerobics, that get your heart pumping.  Range-of-motion activities. These keep your joints limber.  Balance and agility exercises. These help  you maintain daily living skills.  Rest your affected joints as directed by your health care provider.  Keep all follow-up visits as directed by your health care  provider. SEEK MEDICAL CARE IF:   Your skin turns red.  You develop a rash in addition to your joint pain.  You have worsening joint pain.  You have a fever along with joint or muscle aches. SEEK IMMEDIATE MEDICAL CARE IF:  You have a significant loss of weight or appetite.  You have night sweats. [place] of Arthritis and Musculoskeletal and Skin Diseases: www.niams.SouthExposed.es  [employer] on Aging: http://kim-miller.com/  American College of Rheumatology: www.rheumatology.org Document Released: 10/12/2005 Document Revised: 02/26/2014 Document Reviewed: 06/19/2013 [facility] Patient Information 2015 [place], [place]. This information is not intended to replace advice given to you by your health care provider. Make sure you discuss any questions you have with your health care provider.

## 2015-06-27 ENCOUNTER — Emergency Department (HOSPITAL_COMMUNITY)
Admission: EM | Admit: 2015-06-27 | Discharge: 2015-06-27 | Disposition: A | Discharge status: Home / Self Care | Payer: 59 | Attending: Emergency Medicine | Admitting: Emergency Medicine

## 2015-06-27 ENCOUNTER — Encounter (HOSPITAL_COMMUNITY): Payer: Self-pay | Admitting: Emergency Medicine

## 2015-06-27 DIAGNOSIS — F909 Attention-deficit hyperactivity disorder, unspecified type: Secondary | ICD-10-CM | POA: Insufficient documentation

## 2015-06-27 DIAGNOSIS — R102 Pelvic and perineal pain: Secondary | ICD-10-CM

## 2015-06-27 DIAGNOSIS — B372 Candidiasis of skin and nail: Secondary | ICD-10-CM

## 2015-06-27 DIAGNOSIS — F329 Major depressive disorder, single episode, unspecified: Secondary | ICD-10-CM | POA: Insufficient documentation

## 2015-06-27 DIAGNOSIS — Z720 Tobacco use: Secondary | ICD-10-CM | POA: Insufficient documentation

## 2015-06-27 DIAGNOSIS — A599 Trichomoniasis, unspecified: Secondary | ICD-10-CM

## 2015-06-27 DIAGNOSIS — Z90710 Acquired absence of both cervix and uterus: Secondary | ICD-10-CM | POA: Insufficient documentation

## 2015-06-27 DIAGNOSIS — F4310 Post-traumatic stress disorder, unspecified: Secondary | ICD-10-CM | POA: Insufficient documentation

## 2015-06-27 LAB — GC/CHLAMYDIA PROBE AMP (~~LOC~~) NOT AT ARMC
CHLAMYDIA, DNA PROBE: NEGATIVE
Neisseria Gonorrhea: NEGATIVE

## 2015-06-27 LAB — URINALYSIS, ROUTINE W REFLEX MICROSCOPIC
Bilirubin Urine: NEGATIVE
Glucose, UA: NEGATIVE mg/dL
Ketones, ur: NEGATIVE mg/dL
Leukocytes, UA: NEGATIVE
NITRITE: NEGATIVE
PROTEIN: NEGATIVE mg/dL
SPECIFIC GRAVITY, URINE: 1.009 (ref 1.005–1.030)
UROBILINOGEN UA: 0.2 mg/dL (ref 0.0–1.0)
pH: 5.5 (ref 5.0–8.0)

## 2015-06-27 LAB — URINE MICROSCOPIC-ADD ON

## 2015-06-27 LAB — WET PREP, GENITAL: Yeast Wet Prep HPF POC: NONE SEEN

## 2015-06-27 MED ORDER — IBUPROFEN 800 MG PO TABS: 800.0000 mg | ORAL_TABLET | Freq: Three times a day (TID) | ORAL | Status: DC

## 2015-06-27 MED ORDER — IBUPROFEN 800 MG PO TABS
800.0000 mg | ORAL_TABLET | Freq: Once | ORAL | Status: AC
  Administered 2015-06-27: 800 mg via ORAL
  Filled 2015-06-27: qty 1

## 2015-06-27 MED ORDER — CLOTRIMAZOLE 1 % EX CREA: TOPICAL_CREAM | CUTANEOUS | Status: DC

## 2015-06-27 MED ORDER — METRONIDAZOLE 500 MG PO TABS
2000.0000 mg | ORAL_TABLET | Freq: Once | ORAL | Status: AC
  Administered 2015-06-27: 2000 mg via ORAL
  Filled 2015-06-27: qty 4

## 2015-06-27 NOTE — ED Notes (Signed)
Pt c/o mid lower abdominal pain x2 hours, redness noted to same. Pain 8/10. Hx of abdominal surgery. Denies fever, N/V.

## 2015-06-27 NOTE — Discharge Instructions (Signed)

## 2015-06-27 NOTE — ED Provider Notes (Signed)
CSN: [id number]     Arrival date & time 06/27/15  0110 History   First MD Initiated Contact with Patient 06/27/15 0146     No chief complaint on file.    (Consider location/radiation/quality/duration/timing/severity/associated sxs/prior Treatment) HPI Comments: Patient with a history of multiple pelvic infections presents with lower abdominal pain for 2 hours. She denies vaginal discharge but reports there is pain with urination. No fever, nausea or vomiting. She reports a red area to lower abdomen that is tender and drains. No hematuria, back pain. She has not taken anything for discomfort at home.   The history is provided by the patient. No language interpreter was used.    Past Medical History  Diagnosis Date  . PID (acute pelvic inflammatory disease)   . Chlamydia   . Depression   . PTSD (post-traumatic stress disorder)   . ADD (attention deficit disorder with hyperactivity)   . Bilateral ovarian cysts   . Sciatica    Past Surgical History  Procedure Laterality Date  . Back surgery      dbl lumbar fusion L5-S1  . Bladder surgery    . Abdominal hysterectomy    . Laparoscopy    . Rotator cuff repair Right 02/15/2013   No family history on file. Social History  Substance Use Topics  . Smoking status: Current Every Day Smoker -- 0.25 packs/day for 14 years    Types: Cigarettes  . Smokeless tobacco: None  . Alcohol Use: No   OB History    No data available     Review of Systems  Constitutional: Negative for fever and chills.  Gastrointestinal: Positive for abdominal pain. Negative for nausea and vomiting.  Genitourinary: Positive for dysuria and pelvic pain. Negative for hematuria, vaginal bleeding and vaginal discharge.  Musculoskeletal: Negative.  Negative for myalgias.  Skin: Positive for color change.  Neurological: Negative.       Allergies  Imitrex; Clindamycin/lincomycin; Morphine and related; and Vicodin  Home Medications   Prior to Admission  medications   Medication Sig Start Date End Date Taking? Authorizing Provider  ALPRAZolam Duanne Moron) 1 MG tablet Take 1 tablet (1 mg total) by mouth 3 (three) times daily as needed. anxiety 04/11/13  Yes Niel Hummer, NP  amphetamine-dextroamphetamine (ADDERALL) 20 MG tablet Take 20 mg by mouth 3 (three) times daily as needed (for anxiety).   Yes Historical Provider, MD  ibuprofen (ADVIL,MOTRIN) 200 MG tablet Take 800 mg by mouth every 8 (eight) hours as needed for moderate pain.   Yes Historical Provider, MD  oxyCODONE-acetaminophen (PERCOCET/ROXICET) 5-325 MG per tablet Take 1 tablet by mouth every 4 (four) hours as needed for severe pain. 02/02/15  Yes Al Corpus, PA-C  sertraline (ZOLOFT) 100 MG tablet Take 2 tablets (200 mg total) by mouth daily. 04/11/13  Yes Niel Hummer, NP  cyclobenzaprine (FLEXERIL) 10 MG tablet Take 1 tablet (10 mg total) by mouth 2 (two) times daily as needed for muscle spasms. Patient not taking: Reported on 06/27/2015 01/09/15   Domenic Moras, PA-C  furosemide (LASIX) 20 MG tablet Take 1 tablet (20 mg total) by mouth daily. Patient not taking: Reported on 02/02/2015 06/02/14   Sherwood Gambler, MD  traMADol (ULTRAM) 50 MG tablet Take 1 tablet (50 mg total) by mouth every 6 (six) hours as needed. Patient not taking: Reported on 06/27/2015 01/23/15   Larene Pickett, PA-C   BP 174/94 mmHg  Pulse 75  Temp(Src) 98.9 F (37.2 C) (Oral)  Resp 18  SpO2 100%  Physical Exam  Constitutional: She is oriented to person, place, and time. She appears well-developed and well-nourished.  HENT:  Head: Normocephalic.  Neck: Normal range of motion. Neck supple.  Cardiovascular: Normal rate and regular rhythm.   Pulmonary/Chest: Effort normal and breath sounds normal.  Abdominal: Soft. Bowel sounds are normal. There is tenderness. There is no rebound and no guarding.  Lower abdominal tenderness bilaterally.  Genitourinary:  No vaginal discharge present. No specific CMT. She is diffusely  tender throughout the pelvis.   Musculoskeletal: Normal range of motion.  Neurological: She is alert and oriented to person, place, and time.  Skin: Skin is warm and dry. No rash noted.  Red, plaque like rash in fold of pannus c/w candidal infection.   Psychiatric: She has a normal mood and affect.    ED Course  Procedures (including critical care time) Labs Review Labs Reviewed  URINALYSIS, ROUTINE W REFLEX MICROSCOPIC (NOT AT [facility]) - Abnormal; Notable for the following:    APPearance CLOUDY (*)    Hgb urine dipstick SMALL (*)    All other components within normal limits  URINE MICROSCOPIC-ADD ON - Abnormal; Notable for the following:    Squamous Epithelial / LPF FEW (*)    All other components within normal limits    Imaging Review No results found. I have personally reviewed and evaluated these images and lab results as part of my medical decision-making.   EKG Interpretation None      MDM   Final diagnoses:  None    1. Trichomonas 2. Pelvic pain 3. Candidal skin infection  Will provide RX for clotrimazole for fungal rash. 2 gms Flagyl provided for trichomonas. She is stable otherwise, afebrile. Will provide resources for outpatient follow up if symptoms persist.     Charlann Lange, PA-C 06/27/15 0422  Julianne Rice, MD 06/27/15 [phone]

## 2015-06-27 NOTE — ED Notes (Signed)
AVS explained in detail, knows to abstain from sexual contact for approximately 2 weeks. No other c/c.

## 2015-06-30 ENCOUNTER — Emergency Department (HOSPITAL_COMMUNITY)
Admission: EM | Admit: 2015-06-30 | Discharge: 2015-06-30 | Disposition: A | Discharge status: Home / Self Care | Payer: Self-pay | Attending: Emergency Medicine | Admitting: Emergency Medicine

## 2015-06-30 ENCOUNTER — Emergency Department (HOSPITAL_COMMUNITY): Payer: Self-pay

## 2015-06-30 ENCOUNTER — Encounter (HOSPITAL_COMMUNITY): Payer: Self-pay

## 2015-06-30 ENCOUNTER — Emergency Department (HOSPITAL_COMMUNITY): Payer: 59

## 2015-06-30 DIAGNOSIS — F329 Major depressive disorder, single episode, unspecified: Secondary | ICD-10-CM | POA: Insufficient documentation

## 2015-06-30 DIAGNOSIS — F909 Attention-deficit hyperactivity disorder, unspecified type: Secondary | ICD-10-CM | POA: Insufficient documentation

## 2015-06-30 DIAGNOSIS — F4310 Post-traumatic stress disorder, unspecified: Secondary | ICD-10-CM | POA: Insufficient documentation

## 2015-06-30 DIAGNOSIS — N8329 Other ovarian cysts: Secondary | ICD-10-CM | POA: Insufficient documentation

## 2015-06-30 DIAGNOSIS — N83202 Unspecified ovarian cyst, left side: Secondary | ICD-10-CM

## 2015-06-30 DIAGNOSIS — Z8739 Personal history of other diseases of the musculoskeletal system and connective tissue: Secondary | ICD-10-CM | POA: Insufficient documentation

## 2015-06-30 DIAGNOSIS — Z720 Tobacco use: Secondary | ICD-10-CM | POA: Insufficient documentation

## 2015-06-30 DIAGNOSIS — Z79899 Other long term (current) drug therapy: Secondary | ICD-10-CM | POA: Insufficient documentation

## 2015-06-30 DIAGNOSIS — Z8619 Personal history of other infectious and parasitic diseases: Secondary | ICD-10-CM | POA: Insufficient documentation

## 2015-06-30 DIAGNOSIS — R102 Pelvic and perineal pain: Secondary | ICD-10-CM

## 2015-06-30 LAB — COMPREHENSIVE METABOLIC PANEL
ALBUMIN: 3.7 g/dL (ref 3.5–5.0)
ALT: 15 U/L (ref 14–54)
ANION GAP: 9 (ref 5–15)
AST: 39 U/L (ref 15–41)
Alkaline Phosphatase: 77 U/L (ref 38–126)
BUN: 6 mg/dL (ref 6–20)
CHLORIDE: 102 mmol/L (ref 101–111)
CO2: 25 mmol/L (ref 22–32)
Calcium: 8.5 mg/dL — ABNORMAL LOW (ref 8.9–10.3)
Creatinine, Ser: 0.8 mg/dL (ref 0.44–1.00)
GFR calc non Af Amer: >60 mL/min (ref >60)
GLUCOSE: 129 mg/dL — AB (ref 65–99)
POTASSIUM: 4.9 mmol/L (ref 3.5–5.1)
SODIUM: 136 mmol/L (ref 135–145)
Total Bilirubin: 1.4 mg/dL — ABNORMAL HIGH (ref 0.3–1.2)
Total Protein: 6.8 g/dL (ref 6.5–8.1)

## 2015-06-30 LAB — CBC
HEMATOCRIT: 36.9 % (ref 36.0–46.0)
HEMOGLOBIN: 12.7 g/dL (ref 12.0–15.0)
MCH: 30.5 pg (ref 26.0–34.0)
MCHC: 34.4 g/dL (ref 30.0–36.0)
MCV: 88.5 fL (ref 78.0–100.0)
Platelets: 360 10*3/uL (ref 150–400)
RBC: 4.17 MIL/uL (ref 3.87–5.11)
RDW: 13 % (ref 11.5–15.5)
WBC: 9 10*3/uL (ref 4.0–10.5)

## 2015-06-30 LAB — DIFFERENTIAL
Basophils Absolute: 0.1 10*3/uL (ref 0.0–0.1)
Basophils Relative: 1 % (ref 0–1)
EOS ABS: 0.6 10*3/uL (ref 0.0–0.7)
EOS PCT: 6 % — AB (ref 0–5)
LYMPHS PCT: 35 % (ref 12–46)
Lymphs Abs: 3.1 10*3/uL (ref 0.7–4.0)
MONOS PCT: 8 % (ref 3–12)
Monocytes Absolute: 0.7 10*3/uL (ref 0.1–1.0)
Neutro Abs: 4.5 10*3/uL (ref 1.7–7.7)
Neutrophils Relative %: 50 % (ref 43–77)

## 2015-06-30 MED ORDER — HYDROMORPHONE HCL 1 MG/ML IJ SOLN
1.0000 mg | Freq: Once | INTRAMUSCULAR | Status: AC
  Administered 2015-06-30: 1 mg via INTRAMUSCULAR
  Filled 2015-06-30: qty 1

## 2015-06-30 MED ORDER — HYDROMORPHONE HCL 1 MG/ML IJ SOLN
1.0000 mg | Freq: Once | INTRAMUSCULAR | Status: AC
  Administered 2015-06-30: 1 mg via INTRAVENOUS
  Filled 2015-06-30: qty 1

## 2015-06-30 MED ORDER — LORAZEPAM 2 MG/ML IJ SOLN
0.5000 mg | Freq: Once | INTRAMUSCULAR | Status: AC
  Administered 2015-06-30: 0.5 mg via INTRAVENOUS
  Filled 2015-06-30: qty 1

## 2015-06-30 MED ORDER — ONDANSETRON HCL 4 MG/2ML IJ SOLN
4.0000 mg | Freq: Once | INTRAMUSCULAR | Status: AC
  Administered 2015-06-30: 4 mg via INTRAVENOUS
  Filled 2015-06-30: qty 2

## 2015-06-30 MED ORDER — IOHEXOL 300 MG/ML  SOLN
100.0000 mL | Freq: Once | INTRAMUSCULAR | Status: AC | PRN
  Administered 2015-06-30: 100 mL via INTRAVENOUS

## 2015-06-30 MED ORDER — IOHEXOL 300 MG/ML  SOLN
25.0000 mL | Freq: Once | INTRAMUSCULAR | Status: AC | PRN
  Administered 2015-06-30: 25 mL via ORAL

## 2015-06-30 MED ORDER — OXYCODONE-ACETAMINOPHEN 5-325 MG PO TABS: 1.0000 | ORAL_TABLET | ORAL | Status: DC | PRN

## 2015-06-30 NOTE — ED Notes (Signed)
She was seen here a few days ago and was dx with trich--treated with Flagyl.  Here with persistent low abd. Discomfort.  She is in no distress.

## 2015-06-30 NOTE — ED Provider Notes (Signed)
CSN: [id number]     Arrival date & time 06/30/15  1522 History   First MD Initiated Contact with Patient 06/30/15 1548     Chief Complaint  Patient presents with  . Abdominal Pain     (Consider location/radiation/quality/duration/timing/severity/associated sxs/prior Treatment) Patient is a 49 y.o. female presenting with abdominal pain. The history is provided by the patient (Patient state that she is still complaining of lower abdominal pain. She was seen here earlier this week and was evaluated and treated for trichomonas. No fever no vomiting).  Abdominal Pain Pain location:  Suprapubic Pain quality: aching   Pain radiates to:  Does not radiate Pain severity:  Moderate Onset quality:  Gradual Timing:  Constant Progression:  Worsening Associated symptoms: no chest pain, no cough, no diarrhea, no fatigue and no hematuria     Past Medical History  Diagnosis Date  . PID (acute pelvic inflammatory disease)   . Chlamydia   . Depression   . PTSD (post-traumatic stress disorder)   . ADD (attention deficit disorder with hyperactivity)   . Bilateral ovarian cysts   . Sciatica    Past Surgical History  Procedure Laterality Date  . Back surgery      dbl lumbar fusion L5-S1  . Bladder surgery    . Abdominal hysterectomy    . Laparoscopy    . Rotator cuff repair Right 02/15/2013   No family history on file. Social History  Substance Use Topics  . Smoking status: Current Every Day Smoker -- 0.25 packs/day for 14 years    Types: Cigarettes  . Smokeless tobacco: None  . Alcohol Use: No   OB History    No data available     Review of Systems  Constitutional: Negative for appetite change and fatigue.  HENT: Negative for congestion, ear discharge and sinus pressure.   Eyes: Negative for discharge.  Respiratory: Negative for cough.   Cardiovascular: Negative for chest pain.  Gastrointestinal: Positive for abdominal pain. Negative for diarrhea.  Genitourinary: Negative for  frequency and hematuria.  Musculoskeletal: Negative for back pain.  Skin: Negative for rash.  Neurological: Negative for seizures and headaches.  Psychiatric/Behavioral: Negative for hallucinations.      Allergies  Imitrex; Clindamycin/lincomycin; Morphine and related; and Vicodin  Home Medications   Prior to Admission medications   Medication Sig Start Date End Date Taking? Authorizing Provider  ALPRAZolam Duanne Moron) 1 MG tablet Take 1 tablet (1 mg total) by mouth 3 (three) times daily as needed. anxiety 04/11/13  Yes Niel Hummer, NP  amphetamine-dextroamphetamine (ADDERALL) 20 MG tablet Take 20 mg by mouth 3 (three) times daily as needed (for anxiety).   Yes Historical Provider, MD  clotrimazole (LOTRIMIN) 1 % cream Apply to affected area 2 times daily 06/27/15  Yes Shari Upstill, PA-C  ibuprofen (ADVIL,MOTRIN) 800 MG tablet Take 1 tablet (800 mg total) by mouth 3 (three) times daily. 06/27/15  Yes Shari Upstill, PA-C  sertraline (ZOLOFT) 100 MG tablet Take 2 tablets (200 mg total) by mouth daily. 04/11/13  Yes Niel Hummer, NP  cyclobenzaprine (FLEXERIL) 10 MG tablet Take 1 tablet (10 mg total) by mouth 2 (two) times daily as needed for muscle spasms. Patient not taking: Reported on 06/27/2015 01/09/15   Domenic Moras, PA-C  furosemide (LASIX) 20 MG tablet Take 1 tablet (20 mg total) by mouth daily. Patient not taking: Reported on 02/02/2015 06/02/14   Sherwood Gambler, MD  oxyCODONE-acetaminophen (PERCOCET) 5-325 MG per tablet Take 1 tablet by mouth every 4 (  four) hours as needed. 06/30/15   Milton Ferguson, MD  traMADol (ULTRAM) 50 MG tablet Take 1 tablet (50 mg total) by mouth every 6 (six) hours as needed. Patient not taking: Reported on 06/27/2015 01/23/15   Larene Pickett, PA-C   BP 134/67 mmHg  Pulse 79  Temp(Src) 98 F (36.7 C) (Oral)  Resp 17  SpO2 96% Physical Exam  Constitutional: She is oriented to person, place, and time. She appears well-developed.  HENT:  Head: Normocephalic.  Eyes:  Conjunctivae and EOM are normal. No scleral icterus.  Neck: Neck supple. No thyromegaly present.  Cardiovascular: Normal rate and regular rhythm.  Exam reveals no gallop and no friction rub.   No murmur heard. Pulmonary/Chest: No stridor. She has no wheezes. She has no rales. She exhibits no tenderness.  Abdominal: She exhibits no distension. There is tenderness. There is no rebound.  Tender suprapubic  Musculoskeletal: Normal range of motion. She exhibits no edema.  Lymphadenopathy:    She has no cervical adenopathy.  Neurological: She is oriented to person, place, and time. She exhibits normal muscle tone. Coordination normal.  Skin: No rash noted. No erythema.  Psychiatric: She has a normal mood and affect. Her behavior is normal.    ED Course  Procedures (including critical care time) Labs Review Labs Reviewed  COMPREHENSIVE METABOLIC PANEL - Abnormal; Notable for the following:    Glucose, Bld 129 (*)    Calcium 8.5 (*)    Total Bilirubin 1.4 (*)    All other components within normal limits  DIFFERENTIAL - Abnormal; Notable for the following:    Eosinophils Relative 6 (*)    All other components within normal limits  CBC  CBC WITH DIFFERENTIAL/PLATELET    Imaging Review US Transvaginal Non-ob  06/30/2015   CLINICAL DATA:  Prior hysterectomy.  Pelvic pain.  EXAM: TRANSABDOMINAL AND TRANSVAGINAL ULTRASOUND OF PELVIS  TECHNIQUE: Both transabdominal and transvaginal ultrasound examinations of the pelvis were performed. Transabdominal technique was performed for global imaging of the pelvis including uterus, ovaries, adnexal regions, and pelvic cul-de-sac. It was necessary to proceed with endovaginal exam following the transabdominal exam to visualize the ovaries.  COMPARISON:  None  FINDINGS: Uterus  Surgically absent.  Right ovary  Right ovary is not visualized.  No adnexal mass.  Left ovary  Measurements: 3.2 x 1.9 x 1.6 cm. 2.3 x 1.3 x 1.3 cm hypoechoic left ovarian mass without  internal Doppler flow.  Other findings  Small amount of pelvic free fluid.  IMPRESSION: 1. Right ovary not visualized. 2. Prior hysterectomy. 3. 2.3 x 1.3 x 1.3 cm hypoechoic left ovarian mass which may reflect an endometrioma versus hemorrhagic cyst. Recommend follow-up pelvic ultrasound in 6-8 weeks.   Electronically Signed   By: Kathreen Devoid   On: 06/30/2015 19:51   US Pelvis Complete  06/30/2015   CLINICAL DATA:  Prior hysterectomy.  Pelvic pain.  EXAM: TRANSABDOMINAL AND TRANSVAGINAL ULTRASOUND OF PELVIS  TECHNIQUE: Both transabdominal and transvaginal ultrasound examinations of the pelvis were performed. Transabdominal technique was performed for global imaging of the pelvis including uterus, ovaries, adnexal regions, and pelvic cul-de-sac. It was necessary to proceed with endovaginal exam following the transabdominal exam to visualize the ovaries.  COMPARISON:  None  FINDINGS: Uterus  Surgically absent.  Right ovary  Right ovary is not visualized.  No adnexal mass.  Left ovary  Measurements: 3.2 x 1.9 x 1.6 cm. 2.3 x 1.3 x 1.3 cm hypoechoic left ovarian mass without internal Doppler flow.  Other findings  Small amount of pelvic free fluid.  IMPRESSION: 1. Right ovary not visualized. 2. Prior hysterectomy. 3. 2.3 x 1.3 x 1.3 cm hypoechoic left ovarian mass which may reflect an endometrioma versus hemorrhagic cyst. Recommend follow-up pelvic ultrasound in 6-8 weeks.   Electronically Signed   By: Kathreen Devoid   On: 06/30/2015 19:51   Ct Abdomen Pelvis W Contrast  06/30/2015   CLINICAL DATA:  49 year old female with persistent lower abdominal pain. Recently diagnosed trichomonas on medical treatment. Subsequent encounter.  EXAM: CT ABDOMEN AND PELVIS WITH CONTRAST  TECHNIQUE: Multidetector CT imaging of the abdomen and pelvis was performed using the standard protocol following bolus administration of intravenous contrast.  CONTRAST:  151mL OMNIPAQUE IOHEXOL 300 MG/ML SOLN, 69mL OMNIPAQUE IOHEXOL 300 MG/ML  SOLN  COMPARISON:  CT Abdomen and Pelvis 03/28/2013.  FINDINGS: Negative lung bases. No pleural effusion. Mild cardiomegaly. No pericardial effusion.  Motion artifact through the lower abdomen to the top of the pelvis. This obscures the lower lumbar spine at the levels of previous lumbar fusion and partial decompression. No definite No acute osseous abnormality identified. Postoperative changes to the medial right iliac bone.  Small volume of pelvic free fluid (series 2, image 74). The uterus is surgically absent. Mild fluid or soft tissue thickening along the at adnexa/residual fallopian tubes. Right adnexa is asymmetrically larger, but contains mostly simple fluid densitometry (series 2, image 67). Stable appearance of the vaginal cuff other than trace adjacent fluid. Decompressed rectum. Unremarkable urinary bladder.  Retained stool in the sigmoid colon. Allowing for motion artifact, negative left colon, transverse colon, right colon and appendix. No dilated small bowel. Oral contrast has not reached the distal small bowel.  Allowing for motion artifact, negative stomach, duodenum, liver, gallbladder, spleen, pancreas, adrenal glands, portal venous system, major arterial structures, and kidneys. No abdominal free fluid or free air. Symmetric renal contrast excretion on delayed images. No lymphadenopathy identified.  IMPRESSION: 1. Surgically absent uterus. Small volume of pelvic free fluid and mild soft tissue thickening about the adnexa consider Pelvic Inflammatory Disease in this setting. 2. No other acute or inflammatory process identified in the abdomen or pelvis.   Electronically Signed   By: Genevie Ann M.D.   On: 06/30/2015 17:41   I have personally reviewed and evaluated these images and lab results as part of my medical decision-making.   EKG Interpretation None      MDM   Final diagnoses:  Cyst of left ovary    Lower abdominal pain. Patient pelvic exam last week with negative GC chlamydia.  Labs unremarkable CT scan unremarkable. Ultrasound shows ovarian cyst on the left. Patient will be treated with pain medicines and referred to women's clinic.    Milton Ferguson, MD 06/30/15 2045

## 2015-06-30 NOTE — Discharge Instructions (Signed)
[facility]

## 2015-08-07 ENCOUNTER — Emergency Department (HOSPITAL_COMMUNITY)
Admission: EM | Admit: 2015-08-07 | Discharge: 2015-08-07 | Disposition: A | Discharge status: Home / Self Care | Payer: 59 | Attending: Emergency Medicine | Admitting: Emergency Medicine

## 2015-08-07 ENCOUNTER — Encounter (HOSPITAL_COMMUNITY): Payer: Self-pay | Admitting: Emergency Medicine

## 2015-08-07 DIAGNOSIS — Z791 Long term (current) use of non-steroidal anti-inflammatories (NSAID): Secondary | ICD-10-CM | POA: Insufficient documentation

## 2015-08-07 DIAGNOSIS — S61259A Open bite of unspecified finger without damage to nail, initial encounter: Secondary | ICD-10-CM

## 2015-08-07 DIAGNOSIS — F329 Major depressive disorder, single episode, unspecified: Secondary | ICD-10-CM | POA: Insufficient documentation

## 2015-08-07 DIAGNOSIS — F419 Anxiety disorder, unspecified: Secondary | ICD-10-CM | POA: Insufficient documentation

## 2015-08-07 DIAGNOSIS — F4310 Post-traumatic stress disorder, unspecified: Secondary | ICD-10-CM | POA: Insufficient documentation

## 2015-08-07 DIAGNOSIS — Y998 Other external cause status: Secondary | ICD-10-CM | POA: Insufficient documentation

## 2015-08-07 DIAGNOSIS — Z8742 Personal history of other diseases of the female genital tract: Secondary | ICD-10-CM | POA: Insufficient documentation

## 2015-08-07 DIAGNOSIS — Y9389 Activity, other specified: Secondary | ICD-10-CM | POA: Insufficient documentation

## 2015-08-07 DIAGNOSIS — Y9289 Other specified places as the place of occurrence of the external cause: Secondary | ICD-10-CM | POA: Insufficient documentation

## 2015-08-07 DIAGNOSIS — F909 Attention-deficit hyperactivity disorder, unspecified type: Secondary | ICD-10-CM | POA: Insufficient documentation

## 2015-08-07 DIAGNOSIS — S61254A Open bite of right ring finger without damage to nail, initial encounter: Secondary | ICD-10-CM | POA: Insufficient documentation

## 2015-08-07 DIAGNOSIS — Z8619 Personal history of other infectious and parasitic diseases: Secondary | ICD-10-CM | POA: Insufficient documentation

## 2015-08-07 DIAGNOSIS — Z720 Tobacco use: Secondary | ICD-10-CM | POA: Insufficient documentation

## 2015-08-07 DIAGNOSIS — W540XXA Bitten by dog, initial encounter: Secondary | ICD-10-CM | POA: Insufficient documentation

## 2015-08-07 DIAGNOSIS — Z79899 Other long term (current) drug therapy: Secondary | ICD-10-CM | POA: Insufficient documentation

## 2015-08-07 MED ORDER — OXYCODONE-ACETAMINOPHEN 5-325 MG PO TABS: 1.0000 | ORAL_TABLET | ORAL | Status: DC | PRN

## 2015-08-07 MED ORDER — OXYCODONE-ACETAMINOPHEN 5-325 MG PO TABS
2.0000 | ORAL_TABLET | Freq: Once | ORAL | Status: AC
  Administered 2015-08-07: 2 via ORAL
  Filled 2015-08-07: qty 2

## 2015-08-07 MED ORDER — AMOXICILLIN-POT CLAVULANATE 875-125 MG PO TABS
1.0000 | ORAL_TABLET | Freq: Once | ORAL | Status: AC
  Administered 2015-08-07: 1 via ORAL
  Filled 2015-08-07: qty 1

## 2015-08-07 MED ORDER — AMOXICILLIN-POT CLAVULANATE 875-125 MG PO TABS: 1.0000 | ORAL_TABLET | Freq: Two times a day (BID) | ORAL | Status: DC

## 2015-08-07 NOTE — ED Provider Notes (Signed)
CSN: [id number]     Arrival date & time 08/07/15  2217 History  By signing my name below, I, Randa Evens, attest that this documentation has been prepared under the direction and in the presence of [employer], PA-C. Electronically Signed: Randa Evens, ED Scribe. 08/07/2015. 11:24 PM.     Chief Complaint  Patient presents with  . Animal Bite   The history is provided by the patient. No language interpreter was used.   HPI Comments: [name] is a 49 y.o. female who presents to the Emergency Department complaining of dog bite to the right 4th digit tonight 1 hour PTA. Pt presents with a laceration to the right 4th digit and the bleeding is controlled. Pt states that the dog is in her possession and that he has become more aggressive lately towards her. Pt states that the dogs vaccinations are UTD. Pt states that her tetanus is UTD.   Past Medical History  Diagnosis Date  . PID (acute pelvic inflammatory disease)   . Chlamydia   . Depression   . PTSD (post-traumatic stress disorder)   . ADD (attention deficit disorder with hyperactivity)   . Bilateral ovarian cysts   . Sciatica    Past Surgical History  Procedure Laterality Date  . Back surgery      dbl lumbar fusion L5-S1  . Bladder surgery    . Abdominal hysterectomy    . Laparoscopy    . Rotator cuff repair Right 02/15/2013   No family history on file. Social History  Substance Use Topics  . Smoking status: Current Every Day Smoker -- 0.25 packs/day for 14 years    Types: Cigarettes  . Smokeless tobacco: None  . Alcohol Use: No   OB History    No data available      Review of Systems  Skin: Positive for wound.  Neurological: Negative for numbness.  All other systems reviewed and are negative.   Allergies  Imitrex; Clindamycin/lincomycin; Morphine and related; and Vicodin  Home Medications   Prior to Admission medications   Medication Sig Start Date End Date Taking? Authorizing Provider   ALPRAZolam Duanne Moron) 1 MG tablet Take 1 tablet (1 mg total) by mouth 3 (three) times daily as needed. anxiety 04/11/13   Niel Hummer, NP  amoxicillin-clavulanate (AUGMENTIN) 875-125 MG tablet Take 1 tablet by mouth every 12 (twelve) hours. 08/07/15   Antonietta Breach, PA-C  amphetamine-dextroamphetamine (ADDERALL) 20 MG tablet Take 20 mg by mouth 3 (three) times daily as needed (for anxiety).    Historical Provider, MD  clotrimazole (LOTRIMIN) 1 % cream Apply to affected area 2 times daily 06/27/15   Charlann Lange, PA-C  cyclobenzaprine (FLEXERIL) 10 MG tablet Take 1 tablet (10 mg total) by mouth 2 (two) times daily as needed for muscle spasms. Patient not taking: Reported on 06/27/2015 01/09/15   Domenic Moras, PA-C  furosemide (LASIX) 20 MG tablet Take 1 tablet (20 mg total) by mouth daily. Patient not taking: Reported on 02/02/2015 06/02/14   Sherwood Gambler, MD  ibuprofen (ADVIL,MOTRIN) 800 MG tablet Take 1 tablet (800 mg total) by mouth 3 (three) times daily. 06/27/15   Charlann Lange, PA-C  oxyCODONE-acetaminophen (PERCOCET/ROXICET) 5-325 MG tablet Take 1 tablet by mouth every 4 (four) hours as needed for severe pain. 08/07/15   Antonietta Breach, PA-C  sertraline (ZOLOFT) 100 MG tablet Take 2 tablets (200 mg total) by mouth daily. 04/11/13   Niel Hummer, NP  traMADol (ULTRAM) 50 MG tablet Take 1 tablet (  50 mg total) by mouth every 6 (six) hours as needed. Patient not taking: Reported on 06/27/2015 01/23/15   Larene Pickett, PA-C   BP 140/89 mmHg  Pulse 92  Temp(Src) 98.7 F (37.1 C)  Resp 18  SpO2 100%   Physical Exam  Constitutional: She is oriented to person, place, and time. She appears well-developed and well-nourished. No distress.  Nontoxic/nonseptic appearing. Tearful.  HENT:  Head: Normocephalic and atraumatic.  Eyes: Conjunctivae and EOM are normal. No scleral icterus.  Neck: Normal range of motion.  Cardiovascular: Normal rate, regular rhythm and intact distal pulses.   Distal radial pulse 2+ in  the RUE. Capillary refill brisk in all digits of R hand.  Pulmonary/Chest: Effort normal. No respiratory distress.  Respirations even and unlabored  Musculoskeletal: Normal range of motion. She exhibits tenderness.       Right hand: She exhibits tenderness and laceration. She exhibits normal range of motion, no bony tenderness, normal capillary refill, no deformity and no swelling. Normal sensation noted. Normal strength noted.       Hands: 2cm laceration to the medial aspect of the proximal R 4th digit. Laceration through subcutaneous fat. No underlying tendon exposure or bony exposure. Normal ROM of all digits of R hand. Grip strength 5/5. 5/5 strength against resistance of FDP, FDS, and extensors of R 4th digit.  Neurological: She is alert and oriented to person, place, and time. She exhibits normal muscle tone. Coordination normal.  Sensation to light touch in all digits.  Skin: Skin is warm and dry. No rash noted. She is not diaphoretic. No erythema. No pallor.  See MSK  Psychiatric: Her behavior is normal. Her mood appears anxious.  Nursing note and vitals reviewed.   ED Course  Procedures (including critical care time)  COORDINATION OF CARE:  Labs Review Labs Reviewed - No data to display  Imaging Review No results found.    EKG Interpretation None      MDM   Final diagnoses:  Dog bite of finger, initial encounter    49 year old female presents to the emergency department for further evaluation of a dog bite to her right fourth digit. Dog is her own and UTD on its rabies vaccinations. Patient neurovascularly intact. Laceration from bite wound is through the subcutaneous fat without any tendon or bony exposure. Flexor and extensor function of the digit is intact with normal strength against resistance. No evidence of secondary infection at this time. Wound copiously irrigated in sink for approximately 1 minute. Tetanus is up-to-date. Patient given initial course of  Augmentin in the emergency department. Have consulted with Dr. Caralyn Guile of hand surgery regarding potential approximation of wound; he recommends leaving the wound open. Dressing applied in ED. Patient given instruction for wound recheck in 2 days. Return precautions discussed and provided. Patient agreeable to plan with no unaddressed concerns. Patient discharged in good condition.  I, Rinnah Peppel, personally performed the services described in this documentation. All medical record entries made by the scribe were at my direction and in my presence.  I have reviewed the chart and discharge instructions and agree that the record reflects my personal performance and is accurate and complete. Ardis Fullwood.  08/07/2015. 11:25 PM.    Filed Vitals:   08/07/15 2249  BP: 140/89  Pulse: 92  Temp: 98.7 F (37.1 C)  Resp: 18  SpO2: 100%        Antonietta Breach, PA-C 08/07/15 [place], DO 08/10/15 1541

## 2015-08-07 NOTE — Discharge Instructions (Signed)
Apply a new wet-dry dressing daily. Keep covered to prevent infection. Follow up for wound recheck in 48 hours. Take Augmentin as prescribed to prevent infection.   Animal Bite Animal bites can range from mild to serious. An animal bite can result in a scratch on the skin, a deep open cut, a puncture of the skin, a crush injury, or tearing away of the skin or a body part. A small bite from a house pet will usually not cause serious problems. However, some animal bites can become infected or injure a bone or other tissue.  Bites from certain animals can be more dangerous because of the risk of spreading rabies, which is a serious viral infection. This risk is higher with bites from stray animals or wild animals, such as raccoons, foxes, skunks, and bats. Dogs are responsible for most animal bites. Children are bitten more often than adults. SYMPTOMS  Common symptoms of an animal bite include:   Pain.   Bleeding.   Swelling.   Bruising.  DIAGNOSIS  This condition may be diagnosed based on a physical exam and medical history. Your health care provider will examine the wound and ask for details about the animal and how the bite happened. You may also have tests, such as:   Blood tests to check for infection or to determine if surgery is needed.  X-rays to check for damage to bones or joints.  Culture test. This uses a sample of fluid from the wound to check for infection. TREATMENT  Treatment varies depending on the location and type of animal bite and your medical history. Treatment may include:   Wound care. This often includes cleaning the wound, flushing the wound with saline solution, and applying a bandage (dressing). Sometimes, the wound is left open to heal because of the high risk of infection. However, in some cases, the wound may be closed with stitches (sutures), staples, skin glue, or adhesive strips.   Antibiotic medicine.   Tetanus shot.   Rabies treatment if the  animal could have rabies.  In some cases, bites that have become infected may require IV antibiotics and surgical treatment in the hospital.  [place]  Follow instructions from your health care provider about how to take care of your wound. Make sure you:  Wash your hands with soap and water before you change your dressing. If soap and water are not available, use hand sanitizer.  Change your dressing as told by your health care provider.  Leave sutures, skin glue, or adhesive strips in place. These skin closures may need to be in place for 2 weeks or longer. If adhesive strip edges start to loosen and curl up, you may trim the loose edges. Do not remove adhesive strips completely unless your health care provider tells you to do that.  Check your wound every day for signs of infection. Watch for:   Increasing redness, swelling, or pain.   Fluid, blood, or pus.  General Instructions  Take or apply over-the-counter and prescription medicines only as told by your health care provider.   If you were prescribed an antibiotic, take or apply it as told by your health care provider. Do not stop using the antibiotic even if your condition improves.   Keep the injured area raised (elevated) above the level of your heart while you are sitting or lying down, if this is possible.   If directed, apply ice to the injured area.   Put ice in a  plastic bag.   Place a towel between your skin and the bag.   Leave the ice on for 20 minutes, 2-3 times per day.   Keep all follow-up visits as told by your health care provider. This is important.  SEEK MEDICAL CARE IF:  You have increasing redness, swelling, or pain at the site of your wound.   You have a general feeling of sickness (malaise).   You feel nauseous or you vomit.   You have pain that does not get better.  SEEK IMMEDIATE MEDICAL CARE IF:  You have a red streak extending away from your  wound.   You have fluid, blood, or pus coming from your wound.   You have a fever or chills.   You have trouble moving your injured area.   You have numbness or tingling extending beyond the wound.   This information is not intended to replace advice given to you by your health care provider. Make sure you discuss any questions you have with your health care provider.   Document Released: 06/30/2011 Document Revised: 07/03/2015 Document Reviewed: 02/27/2015 Elsevier Interactive Patient Education 2016 [place] A dressing is a material placed over wounds. It keeps the wound clean, dry, and protected from further injury. This provides an environment that favors wound healing.  BEFORE YOU BEGIN  Get your supplies together. Things you may need include:  Saline solution.  Flexible gauze dressing.  Medicated cream.  Tape.  Gloves.  Abdominal dressing pads.  Gauze squares.  Plastic bags.  Take pain medicine 30 minutes before the dressing change if you need it.  Take a shower before you do the first dressing change of the day. Use plastic wrap or a plastic bag to prevent the dressing from getting wet. REMOVING YOUR OLD DRESSING   Wash your hands with soap and water. Dry your hands with a clean towel.  Put on your gloves.  Remove any tape.  Carefully remove the old dressing. If the dressing sticks, you may dampen it with warm water to loosen it, or follow your caregiver's specific directions.  Remove any gauze or packing tape that is in your wound.  Take off your gloves.  Put the gloves, tape, gauze, or any packing tape into a plastic bag. CHANGING YOUR DRESSING  Open the supplies.  Take the cap off the saline solution.  Open the gauze package so that the gauze remains on the inside of the package.  Put on your gloves.  Clean your wound as told by your caregiver.  If you have been told to keep your wound dry, follow those  instructions.  Your caregiver may tell you to do one or more of the following:  Pick up the gauze. Pour the saline solution over the gauze. Squeeze out the extra saline solution.  Put medicated cream or other medicine on your wound if you have been told to do so.  Put the solution soaked gauze only in your wound, not on the skin around it.  Pack your wound loosely or as told by your caregiver.  Put dry gauze on your wound.  Put abdominal dressing pads over the dry gauze if your wet gauze soaks through.  Tape the abdominal dressing pads in place so they will not fall off. Do not wrap the tape completely around the affected part (arm, leg, abdomen).  Wrap the dressing pads with a flexible gauze dressing to secure it in place.  Take off your gloves. Put them in  the plastic bag with the old dressing. Tie the bag shut and throw it away.  Keep the dressing clean and dry until your next dressing change.  Wash your hands. SEEK MEDICAL CARE IF:  Your skin around the wound looks red.  Your wound feels more tender or sore.  You see pus in the wound.  Your wound smells bad.  You have a fever.  Your skin around the wound has a rash that itches and burns.  You see black or yellow skin in your wound that was not there before.  You feel nauseous, throw up, and feel very tired.   This information is not intended to replace advice given to you by your health care provider. Make sure you discuss any questions you have with your health care provider.   Document Released: 11/19/2004 Document Revised: 01/04/2012 Document Reviewed: 08/24/2011 Elsevier Interactive Patient Education [employer].

## 2015-08-07 NOTE — ED Notes (Signed)
Finger dressed with xeroform and dry dressing. Explained to patient to do wet to dry dressings daily at home. Knows to return Friday for wound re-check or before for adverse symptoms. AVS explained in detail. No other c/c.

## 2015-08-07 NOTE — ED Notes (Signed)
Pt presents with dog bite to 4th finger on right hand. 2" wound to same, bleeding controlled. Pain 7/10. Denies numbness and tingling. TDAP two years ago.

## 2015-08-09 ENCOUNTER — Emergency Department (HOSPITAL_COMMUNITY)
Admission: EM | Admit: 2015-08-09 | Discharge: 2015-08-09 | Disposition: A | Discharge status: Home / Self Care | Payer: 59 | Attending: Emergency Medicine | Admitting: Emergency Medicine

## 2015-08-09 ENCOUNTER — Encounter (HOSPITAL_COMMUNITY): Payer: Self-pay

## 2015-08-09 DIAGNOSIS — Z8742 Personal history of other diseases of the female genital tract: Secondary | ICD-10-CM | POA: Insufficient documentation

## 2015-08-09 DIAGNOSIS — Z720 Tobacco use: Secondary | ICD-10-CM | POA: Insufficient documentation

## 2015-08-09 DIAGNOSIS — Z79899 Other long term (current) drug therapy: Secondary | ICD-10-CM | POA: Insufficient documentation

## 2015-08-09 DIAGNOSIS — Y9289 Other specified places as the place of occurrence of the external cause: Secondary | ICD-10-CM | POA: Insufficient documentation

## 2015-08-09 DIAGNOSIS — Y9389 Activity, other specified: Secondary | ICD-10-CM | POA: Insufficient documentation

## 2015-08-09 DIAGNOSIS — W540XXD Bitten by dog, subsequent encounter: Secondary | ICD-10-CM | POA: Insufficient documentation

## 2015-08-09 DIAGNOSIS — F329 Major depressive disorder, single episode, unspecified: Secondary | ICD-10-CM | POA: Insufficient documentation

## 2015-08-09 DIAGNOSIS — S61254D Open bite of right ring finger without damage to nail, subsequent encounter: Secondary | ICD-10-CM | POA: Insufficient documentation

## 2015-08-09 DIAGNOSIS — Y998 Other external cause status: Secondary | ICD-10-CM | POA: Insufficient documentation

## 2015-08-09 DIAGNOSIS — S61451D Open bite of right hand, subsequent encounter: Secondary | ICD-10-CM

## 2015-08-09 DIAGNOSIS — Z8659 Personal history of other mental and behavioral disorders: Secondary | ICD-10-CM | POA: Insufficient documentation

## 2015-08-09 MED ORDER — CLINDAMYCIN PHOSPHATE 900 MG/50ML IV SOLN
900.0000 mg | Freq: Once | INTRAVENOUS | Status: DC
  Filled 2015-08-09: qty 50

## 2015-08-09 MED ORDER — SODIUM CHLORIDE 0.9 % IV BOLUS (SEPSIS)
1000.0000 mL | Freq: Once | INTRAVENOUS | Status: AC
  Administered 2015-08-09: 500 mL via INTRAVENOUS

## 2015-08-09 MED ORDER — OXYCODONE-ACETAMINOPHEN 5-325 MG PO TABS: 1.0000 | ORAL_TABLET | Freq: Four times a day (QID) | ORAL | Status: DC | PRN

## 2015-08-09 MED ORDER — OXYCODONE-ACETAMINOPHEN 5-325 MG PO TABS
1.0000 | ORAL_TABLET | Freq: Once | ORAL | Status: AC
Start: 2015-08-09 — End: 2015-08-09
  Administered 2015-08-09: 1 via ORAL
  Filled 2015-08-09: qty 1

## 2015-08-09 MED ORDER — KETOROLAC TROMETHAMINE 30 MG/ML IJ SOLN
30.0000 mg | Freq: Once | INTRAMUSCULAR | Status: AC
  Administered 2015-08-09: 30 mg via INTRAVENOUS
  Filled 2015-08-09: qty 1

## 2015-08-09 MED ORDER — VANCOMYCIN HCL IN DEXTROSE 1-5 GM/200ML-% IV SOLN
1000.0000 mg | Freq: Once | INTRAVENOUS | Status: AC
  Administered 2015-08-09: 1000 mg via INTRAVENOUS
  Filled 2015-08-09: qty 200

## 2015-08-09 MED ORDER — SULFAMETHOXAZOLE-TRIMETHOPRIM 800-160 MG PO TABS: 1.0000 | ORAL_TABLET | Freq: Two times a day (BID) | ORAL | Status: DC

## 2015-08-09 NOTE — ED Provider Notes (Signed)
CSN: [id number]     Arrival date & time 08/09/15  1450 History   First MD Initiated Contact with Patient 08/09/15 1520     Chief Complaint  Patient presents with  . finger infection     HPI   [name] is a 49 y.o. female who presents for wound check on a dog bite sustained three days ago. The wound is a 2.5cm laceration on the dorsal/radial aspect of her R fourth finger. She states that she feels her pain is getting worse and extends to the dorsum of her hand. She also reports feeling like her finger and hand is more swollen that it had been over the past couple of days. States she has been taking augmentin as prescribed and keeping the wound covered with a dressing. Denies bleeding or purulent discharge. Denies numbness, tingling, or loss of sensation. Denies fever or chills.   Past Medical History  Diagnosis Date  . PID (acute pelvic inflammatory disease)   . Chlamydia   . Depression   . PTSD (post-traumatic stress disorder)   . ADD (attention deficit disorder with hyperactivity)   . Bilateral ovarian cysts   . Sciatica    Past Surgical History  Procedure Laterality Date  . Back surgery      dbl lumbar fusion L5-S1  . Bladder surgery    . Abdominal hysterectomy    . Laparoscopy    . Rotator cuff repair Right 02/15/2013   Family History  Problem Relation Age of Onset  . Hypertension Mother   . Heart failure Mother   . Hypertension Father   . Heart failure Father   . Hypertension Sister    Social History  Substance Use Topics  . Smoking status: Current Every Day Smoker -- 0.25 packs/day for 14 years    Types: Cigarettes  . Smokeless tobacco: None  . Alcohol Use: No   OB History    No data available     Review of Systems  All other systems reviewed and are negative.     Allergies  Imitrex; Clindamycin/lincomycin; Morphine and related; and Vicodin  Home Medications   Prior to Admission medications   Medication Sig Start Date End Date Taking? Authorizing  Provider  ALPRAZolam Duanne Moron) 1 MG tablet Take 1 tablet (1 mg total) by mouth 3 (three) times daily as needed. anxiety 04/11/13  Yes Niel Hummer, NP  amoxicillin-clavulanate (AUGMENTIN) 875-125 MG tablet Take 1 tablet by mouth every 12 (twelve) hours. 08/07/15  Yes Antonietta Breach, PA-C  amphetamine-dextroamphetamine (ADDERALL) 20 MG tablet Take 20 mg by mouth 3 (three) times daily as needed (for anxiety).   Yes Historical Provider, MD  ibuprofen (ADVIL,MOTRIN) 800 MG tablet Take 1 tablet (800 mg total) by mouth 3 (three) times daily. 06/27/15  Yes Charlann Lange, PA-C  oxyCODONE-acetaminophen (PERCOCET/ROXICET) 5-325 MG tablet Take 1 tablet by mouth every 4 (four) hours as needed for severe pain. 08/07/15  Yes Antonietta Breach, PA-C  sertraline (ZOLOFT) 100 MG tablet Take 2 tablets (200 mg total) by mouth daily. 04/11/13  Yes Niel Hummer, NP  clotrimazole (LOTRIMIN) 1 % cream Apply to affected area 2 times daily Patient not taking: Reported on 08/09/2015 06/27/15   Charlann Lange, PA-C  cyclobenzaprine (FLEXERIL) 10 MG tablet Take 1 tablet (10 mg total) by mouth 2 (two) times daily as needed for muscle spasms. Patient not taking: Reported on 06/27/2015 01/09/15   Domenic Moras, PA-C  furosemide (LASIX) 20 MG tablet Take 1 tablet (20 mg total) by  mouth daily. Patient not taking: Reported on 02/02/2015 06/02/14   Sherwood Gambler, MD  traMADol (ULTRAM) 50 MG tablet Take 1 tablet (50 mg total) by mouth every 6 (six) hours as needed. Patient not taking: Reported on 06/27/2015 01/23/15   Larene Pickett, PA-C   BP 137/85 mmHg  Pulse 74  Temp(Src) 98.3 F (36.8 C) (Oral)  Resp 16  SpO2 97% Physical Exam  Constitutional: She is oriented to person, place, and time. She appears well-developed and well-nourished.  Eyes: No scleral icterus.  Cardiovascular: Normal rate, regular rhythm and normal heart sounds.   Pulmonary/Chest: Effort normal and breath sounds normal. No respiratory distress.  Abdominal: She exhibits no  distension.  Musculoskeletal:  2.5 cm laceration on R fourth finger. No bleeding or discharge. Mild erythema immediately surrounding wound and on dorsum of hand. Mild edema of finger and hand. FROM. Sensation intact. Strength intact.   Neurological: She is alert and oriented to person, place, and time.  Psychiatric: She has a normal mood and affect. Her behavior is normal.  Nursing note and vitals reviewed.   ED Course  Procedures (including critical care time) Labs Review Labs Reviewed - No data to display  Imaging Review No results found. I have personally reviewed and evaluated these images and lab results as part of my medical decision-making.   EKG Interpretation None      MDM   Final diagnoses:  None    13:45  Will discuss with ortho/hand surgeon (Dr. Caralyn Guile) for recs. Wound looks like it is healing but infection continues as there is some edema and erythema. Will likely need different antibiotic but surgical intervention unlikely at this time.  14:05 Dr. Angus Palms office returned call. They will be happy to see pt for f/u in office on 10/17 but MD did not call back. Will give pt IV Vanc and send home with rx for PO Bactrim and percocet. Pt to f/u in hand clinic on 10/17.       Anne Ng, PA-C 08/10/15 0019  Noemi Chapel, MD 08/11/15 2056

## 2015-08-09 NOTE — ED Provider Notes (Signed)
The patient is a 49 year old female who was injured by her dog bit her right finger - this was left open at hand surgery request - she has been taking Augmentin and despite this has some increased pain, she describes some pus, swelling of the dorsum of the hand, pain, no fevers. She has been taking Percocet.  On exam she has an open wound on the radial surface of the right ring finger, approximately 2-1/2 cm in length, pink tissue inside, minimal erythema surrounding, slight erythema onto the mid dorsum of the hand, she has good range of motion of the fingers but has significant pain with flexion, she has no tendon injury based on ability to resist flexion and extension of the finger.images obtained, we'll place on chart and reconsult with hand surgery.   Medical screening examination/treatment/procedure(s) were conducted as a shared visit with non-physician practitioner(s) and myself.  I personally evaluated the patient during the encounter.  Clinical Impression:   Final diagnoses:  Dog bite, hand, right, subsequent encounter           Noemi Chapel, MD 08/11/15 2034

## 2015-08-09 NOTE — ED Notes (Signed)
Patient was seen in the ED w days ago for a dog bite to the right 4th finger. Patient has redness and swelling to the finger and right hand. Patient states that she was taking Augmentin,but has been having diarrhea and GI upset.

## 2015-08-09 NOTE — Discharge Instructions (Signed)
Please follow up with Dr. Caralyn Guile in his hand clinic on Monday, 08/12/15. Return to the ER if you start noticing increased swelling, bleeding, discharge, or redness going up your arm.  Please obtain all of your results from medical records or have your doctors office obtain the results - share them with your doctor - you should be seen at your doctors office in the next 2 days. Call today to arrange your follow up. Take the medications as prescribed. Please review all of the medicines and only take them if you do not have an allergy to them. Please be aware that if you are taking birth control pills, taking other prescriptions, ESPECIALLY ANTIBIOTICS may make the birth control ineffective - if this is the case, either do not engage in sexual activity or use alternative methods of birth control such as condoms until you have finished the medicine and your family doctor says it is OK to restart them. If you are on a blood thinner such as COUMADIN, be aware that any other medicine that you take may cause the coumadin to either work too much, or not enough - you should have your coumadin level rechecked in next 7 days if this is the case.  ?  It is also a possibility that you have an allergic reaction to any of the medicines that you have been prescribed - Everybody reacts differently to medications and while MOST people have no trouble with most medicines, you may have a reaction such as nausea, vomiting, rash, swelling, shortness of breath. If this is the case, please stop taking the medicine immediately and contact your physician.  ?  You should return to the ER if you develop severe or worsening symptoms.

## 2016-01-01 ENCOUNTER — Emergency Department (HOSPITAL_COMMUNITY)
Admission: EM | Admit: 2016-01-01 | Discharge: 2016-01-01 | Disposition: A | Discharge status: Home / Self Care | Payer: Self-pay | Attending: Emergency Medicine | Admitting: Emergency Medicine

## 2016-01-01 ENCOUNTER — Encounter (HOSPITAL_COMMUNITY): Payer: Self-pay

## 2016-01-01 DIAGNOSIS — F17210 Nicotine dependence, cigarettes, uncomplicated: Secondary | ICD-10-CM | POA: Insufficient documentation

## 2016-01-01 DIAGNOSIS — F329 Major depressive disorder, single episode, unspecified: Secondary | ICD-10-CM | POA: Insufficient documentation

## 2016-01-01 DIAGNOSIS — Z8619 Personal history of other infectious and parasitic diseases: Secondary | ICD-10-CM | POA: Insufficient documentation

## 2016-01-01 DIAGNOSIS — Z792 Long term (current) use of antibiotics: Secondary | ICD-10-CM | POA: Insufficient documentation

## 2016-01-01 DIAGNOSIS — W228XXA Striking against or struck by other objects, initial encounter: Secondary | ICD-10-CM | POA: Insufficient documentation

## 2016-01-01 DIAGNOSIS — Z79899 Other long term (current) drug therapy: Secondary | ICD-10-CM | POA: Insufficient documentation

## 2016-01-01 DIAGNOSIS — Y9289 Other specified places as the place of occurrence of the external cause: Secondary | ICD-10-CM | POA: Insufficient documentation

## 2016-01-01 DIAGNOSIS — F4310 Post-traumatic stress disorder, unspecified: Secondary | ICD-10-CM | POA: Insufficient documentation

## 2016-01-01 DIAGNOSIS — Z8739 Personal history of other diseases of the musculoskeletal system and connective tissue: Secondary | ICD-10-CM | POA: Insufficient documentation

## 2016-01-01 DIAGNOSIS — S00412A Abrasion of left ear, initial encounter: Secondary | ICD-10-CM | POA: Insufficient documentation

## 2016-01-01 DIAGNOSIS — Z791 Long term (current) use of non-steroidal anti-inflammatories (NSAID): Secondary | ICD-10-CM | POA: Insufficient documentation

## 2016-01-01 DIAGNOSIS — Y9389 Activity, other specified: Secondary | ICD-10-CM | POA: Insufficient documentation

## 2016-01-01 DIAGNOSIS — Z8742 Personal history of other diseases of the female genital tract: Secondary | ICD-10-CM | POA: Insufficient documentation

## 2016-01-01 DIAGNOSIS — Y998 Other external cause status: Secondary | ICD-10-CM | POA: Insufficient documentation

## 2016-01-01 MED ORDER — OXYCODONE-ACETAMINOPHEN 5-325 MG PO TABS
1.0000 | ORAL_TABLET | Freq: Once | ORAL | Status: AC
  Administered 2016-01-01: 1 via ORAL
  Filled 2016-01-01: qty 1

## 2016-01-01 MED ORDER — OXYCODONE-ACETAMINOPHEN 5-325 MG PO TABS: 1.0000 | ORAL_TABLET | Freq: Four times a day (QID) | ORAL | Status: DC | PRN

## 2016-01-01 MED ORDER — NEOMYCIN-POLYMYXIN-HC 3.5-10000-1 OT SUSP: 5.0000 [drp] | Freq: Four times a day (QID) | OTIC | Status: DC

## 2016-01-01 NOTE — Discharge Instructions (Signed)
Use ear drops as directed. Do not use q-tips anymore! Use ibuprofen or tylenol as needed for pain, using percocet as directed as needed for additional pain relief but don't drive while taking this medication. Follow up with [facility] in 1-2 weeks for recheck of symptoms and to establish care. Return to the ER for changes or worsening symptoms.

## 2016-01-01 NOTE — ED Notes (Signed)
Pt with earache x 4 days.  Left side.  No fever

## 2016-01-01 NOTE — ED Provider Notes (Signed)
CSN: [id number]     Arrival date & time 01/01/16  [id number] History   First MD Initiated Contact with Patient 01/01/16 [phone]     Chief Complaint  Patient presents with  . Otalgia     (Consider location/radiation/quality/duration/timing/severity/associated sxs/prior Treatment) HPI Comments: [name] is a 50 y.o. female with a PMHx of remote PID, ADD, ovarian cysts, sciatica, PTSD, and depression, with a PSHx of lumbar fusion, abd hysterectomy, and bladder surgery, who presents to the ED with complaints of left ear pain 4 days. She is The pain is 8/10 constant sharp left ear pain which radiates into her neck occasionally, worse with palpation to the ear and jaw movements/chewing, and unrelieved with tramadol and Tylenol. She does admit to Q-tip use. She denies any dental pain, gum swelling, ear drainage, hearing loss, rhinorrhea, sore throat, fevers, chills, chest pain, shortness breath, abdominal pain, nausea, vomiting, diarrhea, constipation, dysuria, hematuria, numbness, tingling, weakness, face or head swelling, or mastoid tenderness. She denies any recent underwater activities, elevation changes, lateralizing, or trauma to the ear.  Patient is a 50 y.o. female presenting with ear pain. The history is provided by the patient. No language interpreter was used.  Otalgia Location:  Left Behind ear:  No abnormality Quality:  Sharp Severity:  Moderate Onset quality:  Gradual Duration:  4 days Timing:  Constant Progression:  Unchanged Context: not direct blow, not elevation change, not foreign body in ear, not loud noise and no water in ear   Relieved by:  Nothing Worsened by:  Palpation (and chewing) Ineffective treatments: tramadol and tylenol. Associated symptoms: no abdominal pain, no diarrhea, no ear discharge, no fever, no hearing loss, no rhinorrhea, no sore throat and no vomiting   Risk factors: no recent travel     Past Medical History  Diagnosis Date  . PID (acute pelvic  inflammatory disease)   . Chlamydia   . Depression   . PTSD (post-traumatic stress disorder)   . ADD (attention deficit disorder with hyperactivity)   . Bilateral ovarian cysts   . Sciatica    Past Surgical History  Procedure Laterality Date  . Back surgery      dbl lumbar fusion L5-S1  . Bladder surgery    . Abdominal hysterectomy    . Laparoscopy    . Rotator cuff repair Right 02/15/2013   Family History  Problem Relation Age of Onset  . Hypertension Mother   . Heart failure Mother   . Hypertension Father   . Heart failure Father   . Hypertension Sister    Social History  Substance Use Topics  . Smoking status: Current Every Day Smoker -- 0.25 packs/day for 14 years    Types: Cigarettes  . Smokeless tobacco: None  . Alcohol Use: No   OB History    No data available     Review of Systems  Constitutional: Negative for fever and chills.  HENT: Positive for ear pain. Negative for dental problem, ear discharge, facial swelling, hearing loss, rhinorrhea and sore throat.   Respiratory: Negative for shortness of breath.   Cardiovascular: Negative for chest pain.  Gastrointestinal: Negative for nausea, vomiting, abdominal pain, diarrhea and constipation.  Genitourinary: Negative for dysuria and hematuria.  Musculoskeletal: Negative for myalgias and arthralgias.  Skin: Negative for color change.  Allergic/Immunologic: Negative for immunocompromised state.  Neurological: Negative for weakness and numbness.  Psychiatric/Behavioral: Negative for confusion.   10 Systems reviewed and are negative for acute change except as noted in the HPI.  Allergies  Imitrex; Clindamycin/lincomycin; Morphine and related; and Vicodin  Home Medications   Prior to Admission medications   Medication Sig Start Date End Date Taking? Authorizing Provider  ALPRAZolam Duanne Moron) 1 MG tablet Take 1 tablet (1 mg total) by mouth 3 (three) times daily as needed. anxiety 04/11/13   Niel Hummer, NP   amoxicillin-clavulanate (AUGMENTIN) 875-125 MG tablet Take 1 tablet by mouth every 12 (twelve) hours. 08/07/15   Antonietta Breach, PA-C  amphetamine-dextroamphetamine (ADDERALL) 20 MG tablet Take 20 mg by mouth 3 (three) times daily as needed (for anxiety).    Historical Provider, MD  clotrimazole (LOTRIMIN) 1 % cream Apply to affected area 2 times daily Patient not taking: Reported on 08/09/2015 06/27/15   Charlann Lange, PA-C  cyclobenzaprine (FLEXERIL) 10 MG tablet Take 1 tablet (10 mg total) by mouth 2 (two) times daily as needed for muscle spasms. Patient not taking: Reported on 06/27/2015 01/09/15   Domenic Moras, PA-C  furosemide (LASIX) 20 MG tablet Take 1 tablet (20 mg total) by mouth daily. Patient not taking: Reported on 02/02/2015 06/02/14   Sherwood Gambler, MD  ibuprofen (ADVIL,MOTRIN) 800 MG tablet Take 1 tablet (800 mg total) by mouth 3 (three) times daily. 06/27/15   Charlann Lange, PA-C  oxyCODONE-acetaminophen (PERCOCET/ROXICET) 5-325 MG tablet Take 1 tablet by mouth every 6 (six) hours as needed for severe pain. 08/09/15   Olivia Canter Sam, PA-C  sertraline (ZOLOFT) 100 MG tablet Take 2 tablets (200 mg total) by mouth daily. 04/11/13   Niel Hummer, NP  sulfamethoxazole-trimethoprim (BACTRIM DS,SEPTRA DS) 800-160 MG tablet Take 1 tablet by mouth 2 (two) times daily. 08/09/15   Olivia Canter Sam, PA-C  traMADol (ULTRAM) 50 MG tablet Take 1 tablet (50 mg total) by mouth every 6 (six) hours as needed. Patient not taking: Reported on 06/27/2015 01/23/15   Larene Pickett, PA-C   BP 144/91 mmHg  Pulse 66  Temp(Src) 98.2 F (36.8 C) (Oral)  Resp 15  SpO2 96% Physical Exam  Constitutional: She is oriented to person, place, and time. Vital signs are normal. She appears well-developed and well-nourished.  Non-toxic appearance. No distress.  Afebrile, nontoxic, NAD  HENT:  Head: Normocephalic and atraumatic.  Right Ear: Hearing, tympanic membrane, external ear and ear canal normal.  Left Ear: Hearing and tympanic  membrane normal. Left ear exhibits lacerations (abrasion on inferior ear canal). There is tenderness (with pinna retraction). No drainage or swelling. Tympanic membrane is not injected, not erythematous and not bulging. No decreased hearing is noted.  Nose: Nose normal.  Mouth/Throat: Uvula is midline, oropharynx is clear and moist and mucous membranes are normal. No trismus in the jaw. No dental abscesses, uvula swelling or dental caries.  R ear clear. L ear with small abrasion with surrounding erythema along inferior aspect of ext ear canal, mild pain with pinna retraction, canal without swelling or drainage, TM clear. No mastoid tenderness or erythema. Nose clear. Oropharynx clear and moist, without uvular swelling or deviation, no trismus or drooling, no tonsillar swelling or erythema, no exudates.  Dentitia without evidence of fracture or caries, no abscesses. Some crowns and missing teeth noted. No dental tenderness  Eyes: Conjunctivae and EOM are normal. Right eye exhibits no discharge. Left eye exhibits no discharge.  Neck: Normal range of motion. Neck supple.  Cardiovascular: Normal rate.   Pulmonary/Chest: Effort normal. No respiratory distress.  Abdominal: Normal appearance. She exhibits no distension.  Musculoskeletal: Normal range of motion.  Lymphadenopathy:  Head (right side): No submandibular and no tonsillar adenopathy present.       Head (left side): No submandibular and no tonsillar adenopathy present.  No head/neck LAD  Neurological: She is alert and oriented to person, place, and time. She has normal strength. No sensory deficit.  Skin: Skin is warm, dry and intact. No rash noted.  Psychiatric: She has a normal mood and affect. Her behavior is normal.  Nursing note and vitals reviewed.   ED Course  Procedures (including critical care time) Labs Review Labs Reviewed - No data to display  Imaging Review No results found. I have personally reviewed and evaluated  these images and lab results as part of my medical decision-making.   EKG Interpretation None      MDM   Final diagnoses:  Ear canal abrasion, left, initial encounter    50 y.o. female here with L otalgia x4 days, uses q-tips. Canal with small abrasion, no swelling or drainage. TM clear. Discussed that we will tx as if it were an otitis media in order to prevent worsening. Discussed avoidance of Qtip use. F/up with [facility] in 1wk to establish care and recheck symptoms. Will give pain meds as well. I explained the diagnosis and have given explicit precautions to return to the ER including for any other new or worsening symptoms. The patient understands and accepts the medical plan as it's been dictated and I have answered their questions. Discharge instructions concerning home care and prescriptions have been given. The patient is STABLE and is discharged to home in good condition.  BP 144/91 mmHg  Pulse 66  Temp(Src) 98.2 F (36.8 C) (Oral)  Resp 15  SpO2 96%  Meds ordered this encounter  Medications  . oxyCODONE-acetaminophen (PERCOCET/ROXICET) 5-325 MG per tablet 1 tablet    Sig:   . neomycin-polymyxin-hydrocortisone (CORTISPORIN) 3.5-10000-1 otic suspension    Sig: Place 5 drops into the left ear 4 (four) times daily. X 7 days    Dispense:  10 mL    Refill:  0    Order Specific Question:  Supervising Provider    Answer:  MILLER, BRIAN [[id number]]  . oxyCODONE-acetaminophen (PERCOCET) 5-325 MG tablet    Sig: Take 1 tablet by mouth every 6 (six) hours as needed for severe pain.    Dispense:  6 tablet    Refill:  0    Order Specific Question:  Supervising Provider    Answer:  Noemi Chapel [[id number]]       Grigor Lipschutz Camprubi-Soms, PA-C 01/01/16 [id number]  Varney Biles, MD 01/02/16 [phone]

## 2016-01-04 ENCOUNTER — Encounter (HOSPITAL_COMMUNITY): Payer: Self-pay | Admitting: *Deleted

## 2016-01-04 ENCOUNTER — Emergency Department (HOSPITAL_COMMUNITY)
Admission: EM | Admit: 2016-01-04 | Discharge: 2016-01-04 | Disposition: A | Discharge status: Home / Self Care | Payer: MEDICAID | Attending: Emergency Medicine | Admitting: Emergency Medicine

## 2016-01-04 DIAGNOSIS — Z8739 Personal history of other diseases of the musculoskeletal system and connective tissue: Secondary | ICD-10-CM | POA: Insufficient documentation

## 2016-01-04 DIAGNOSIS — Z8742 Personal history of other diseases of the female genital tract: Secondary | ICD-10-CM | POA: Insufficient documentation

## 2016-01-04 DIAGNOSIS — F17210 Nicotine dependence, cigarettes, uncomplicated: Secondary | ICD-10-CM | POA: Insufficient documentation

## 2016-01-04 DIAGNOSIS — Z792 Long term (current) use of antibiotics: Secondary | ICD-10-CM | POA: Insufficient documentation

## 2016-01-04 DIAGNOSIS — F909 Attention-deficit hyperactivity disorder, unspecified type: Secondary | ICD-10-CM | POA: Insufficient documentation

## 2016-01-04 DIAGNOSIS — Z79899 Other long term (current) drug therapy: Secondary | ICD-10-CM | POA: Insufficient documentation

## 2016-01-04 DIAGNOSIS — H9202 Otalgia, left ear: Secondary | ICD-10-CM | POA: Insufficient documentation

## 2016-01-04 DIAGNOSIS — Z791 Long term (current) use of non-steroidal anti-inflammatories (NSAID): Secondary | ICD-10-CM | POA: Insufficient documentation

## 2016-01-04 DIAGNOSIS — F329 Major depressive disorder, single episode, unspecified: Secondary | ICD-10-CM | POA: Insufficient documentation

## 2016-01-04 DIAGNOSIS — Z8619 Personal history of other infectious and parasitic diseases: Secondary | ICD-10-CM | POA: Insufficient documentation

## 2016-01-04 DIAGNOSIS — F4310 Post-traumatic stress disorder, unspecified: Secondary | ICD-10-CM | POA: Insufficient documentation

## 2016-01-04 MED ORDER — OXYCODONE-ACETAMINOPHEN 5-325 MG PO TABS: 1.0000 | ORAL_TABLET | ORAL | Status: DC | PRN

## 2016-01-04 NOTE — ED Notes (Signed)
Pt was seen here on 01/01/16 for left ear ache - was told there was an abrasion on her eardrum - pt given ear drops and percocet. Pt c/o progressively worse pain. Denies fever.

## 2016-01-04 NOTE — Discharge Instructions (Signed)
1. Medications: ear drops, percocet, usual home medications 2. Treatment: rest, drink plenty of fluids 3. Follow Up: please followup with your primary doctor for discussion of your diagnoses and further evaluation after today's visit; if you do not have a primary care doctor use the phone number listed in your discharge paperwork to find one; please return to the ER for new or worsening smyptoms   Earache An earache, also called otalgia, can be caused by many things. Pain from an earache can be sharp, dull, or burning. The pain may be temporary or constant. Earaches can be caused by problems with the ear, such as infection in either the middle ear or the ear canal, injury, impacted ear wax, middle ear pressure, or a foreign body in the ear. Ear pain can also result from problems in other areas. This is called referred pain. For example, pain can come from a sore throat, a tooth infection, or problems with the jaw or the joint between the jaw and the skull (temporomandibular joint, or TMJ). The cause of an earache is not always easy to identify. Watchful waiting may be appropriate for some earaches until a clear cause of the pain can be found. HOME CARE INSTRUCTIONS Watch your condition for any changes. The following actions may help to lessen any discomfort that you are feeling:  Take medicines only as directed by your health care provider. This includes ear drops.  Apply ice to your outer ear to help reduce pain.  Put ice in a plastic bag.  Place a towel between your skin and the bag.  Leave the ice on for 20 minutes, 2-3 times per day.  Do not put anything in your ear other than medicine that is prescribed by your health care provider.  Try resting in an upright position instead of lying down. This may help to reduce pressure in the middle ear and relieve pain.  Chew gum if it helps to relieve your ear pain.  Control any allergies that you have.  Keep all follow-up visits as directed  by your health care provider. This is important. SEEK MEDICAL CARE IF:  Your pain does not improve within 2 days.  You have a fever.  You have new or worsening symptoms. SEEK IMMEDIATE MEDICAL CARE IF:  You have a severe headache.  You have a stiff neck.  You have difficulty swallowing.  You have redness or swelling behind your ear.  You have drainage from your ear.  You have hearing loss.  You feel dizzy.   This information is not intended to replace advice given to you by your health care provider. Make sure you discuss any questions you have with your health care provider.   Document Released: 05/29/2004 Document Revised: 11/02/2014 Document Reviewed: 05/13/2014 Elsevier Interactive Patient Education [employer].

## 2016-01-04 NOTE — ED Provider Notes (Signed)
CSN: [id number]     Arrival date & time 01/04/16  1601 History  By signing my name below, I, [name], attest that this documentation has been prepared under the direction and in the presence of non-physician practitioner, Bernerd Limbo, PA-C. Electronically Signed: Dora Kennedy, Scribe. 01/04/2016. 6:18 PM.    Chief Complaint  Patient presents with  . Otalgia    The history is provided by the patient. No language interpreter was used.     HPI Comments: [name] is a 50 y.o. female with PMHx of left ear canal abrasion who presents to the Emergency Department complaining of sudden onset, constant, worsening, inner left ear pain for the last week. She was seen in the ED 3 days ago for the same problem and was told there was an abrasion in her left ear canal. She was given eardrops and percocet in the ED; she notes that the eardrops provide temporary mild relief of her pain. She states that her left her pain is exacerbated by eating/chewing. She endorses Q-tip use but has not used Q-tips recently. She denies ear drainage or any other associated symptoms.   Past Medical History  Diagnosis Date  . PID (acute pelvic inflammatory disease)   . Chlamydia   . Depression   . PTSD (post-traumatic stress disorder)   . ADD (attention deficit disorder with hyperactivity)   . Bilateral ovarian cysts   . Sciatica    Past Surgical History  Procedure Laterality Date  . Back surgery      dbl lumbar fusion L5-S1  . Bladder surgery    . Abdominal hysterectomy    . Laparoscopy    . Rotator cuff repair Right 02/15/2013   Family History  Problem Relation Age of Onset  . Hypertension Mother   . Heart failure Mother   . Hypertension Father   . Heart failure Father   . Hypertension Sister    Social History  Substance Use Topics  . Smoking status: Current Every Day Smoker -- 0.25 packs/day for 14 years    Types: Cigarettes  . Smokeless tobacco: None  . Alcohol Use: No   OB  History    No data available      Review of Systems  Constitutional: Negative for fever.  HENT: Positive for ear pain (left). Negative for ear discharge.       Allergies  Imitrex; Clindamycin/lincomycin; Morphine and related; and Vicodin  Home Medications   Prior to Admission medications   Medication Sig Start Date End Date Taking? Authorizing Provider  ALPRAZolam Duanne Moron) 1 MG tablet Take 1 tablet (1 mg total) by mouth 3 (three) times daily as needed. anxiety 04/11/13   Niel Hummer, NP  amoxicillin-clavulanate (AUGMENTIN) 875-125 MG tablet Take 1 tablet by mouth every 12 (twelve) hours. 08/07/15   Antonietta Breach, PA-C  amphetamine-dextroamphetamine (ADDERALL) 20 MG tablet Take 20 mg by mouth 3 (three) times daily as needed (for anxiety).    Historical Provider, MD  clotrimazole (LOTRIMIN) 1 % cream Apply to affected area 2 times daily Patient not taking: Reported on 08/09/2015 06/27/15   Charlann Lange, PA-C  cyclobenzaprine (FLEXERIL) 10 MG tablet Take 1 tablet (10 mg total) by mouth 2 (two) times daily as needed for muscle spasms. Patient not taking: Reported on 06/27/2015 01/09/15   Domenic Moras, PA-C  furosemide (LASIX) 20 MG tablet Take 1 tablet (20 mg total) by mouth daily. Patient not taking: Reported on 02/02/2015 06/02/14   Sherwood Gambler, MD  ibuprofen (ADVIL,MOTRIN) 800  MG tablet Take 1 tablet (800 mg total) by mouth 3 (three) times daily. 06/27/15   Charlann Lange, PA-C  neomycin-polymyxin-hydrocortisone (CORTISPORIN) 3.5-10000-1 otic suspension Place 5 drops into the left ear 4 (four) times daily. X 7 days 01/01/16   Mercedes Camprubi-Soms, PA-C  oxyCODONE-acetaminophen (PERCOCET/ROXICET) 5-325 MG tablet Take 1-2 tablets by mouth every 4 (four) hours as needed for severe pain. 01/04/16   Marella Chimes, PA-C  sertraline (ZOLOFT) 100 MG tablet Take 2 tablets (200 mg total) by mouth daily. 04/11/13   Niel Hummer, NP  sulfamethoxazole-trimethoprim (BACTRIM DS,SEPTRA DS) 800-160 MG tablet  Take 1 tablet by mouth 2 (two) times daily. 08/09/15   Olivia Canter Sam, PA-C  traMADol (ULTRAM) 50 MG tablet Take 1 tablet (50 mg total) by mouth every 6 (six) hours as needed. Patient not taking: Reported on 06/27/2015 01/23/15   Larene Pickett, PA-C    BP 153/89 mmHg  Pulse 80  Temp(Src) 97.9 F (36.6 C) (Oral)  Resp 20  SpO2 98% Physical Exam  Constitutional: She is oriented to person, place, and time. She appears well-developed and well-nourished. No distress.  HENT:  Head: Normocephalic and atraumatic.  Right Ear: Hearing, tympanic membrane, external ear and ear canal normal. No foreign bodies. No mastoid tenderness. Tympanic membrane is not perforated and not erythematous.  Left Ear: Hearing, tympanic membrane and external ear normal. No foreign bodies. No mastoid tenderness. Tympanic membrane is not perforated and not erythematous.  Nose: Nose normal.  Mouth/Throat: Oropharynx is clear and moist.  Small amount of dried blood in left ear canal. TM clear and intact.  Eyes: Conjunctivae and EOM are normal. Pupils are equal, round, and reactive to light. Right eye exhibits no discharge. Left eye exhibits no discharge. No scleral icterus.  Neck: Normal range of motion. Neck supple.  Cardiovascular: Normal rate and regular rhythm.   Pulmonary/Chest: Effort normal and breath sounds normal. No respiratory distress.  Musculoskeletal: Normal range of motion. She exhibits no edema or tenderness.  Neurological: She is alert and oriented to person, place, and time.  Skin: Skin is warm and dry. She is not diaphoretic.  Psychiatric: She has a normal mood and affect. Her behavior is normal.  Nursing note and vitals reviewed.   ED Course  Procedures (including critical care time)  DIAGNOSTIC STUDIES: Oxygen Saturation is 98% on RA, normal by my interpretation.    COORDINATION OF CARE: 6:19 PM Discussed treatment plan with pt at bedside and pt agreed to plan.   Labs Review Labs Reviewed - No  data to display  Imaging Review No results found.    EKG Interpretation None      MDM   Final diagnoses:  Left ear pain    50 year old female presents with persistent left ear pain. Patient is afebrile. Vital signs stable. On exam, TMs clear and intact bilaterally. Patient has a small amount of dried blood in her left ear canal. Advised patient to continue using eardrops. Will refill pain medication. Patient to follow-up with PCP for further evaluation and management. Return precautions discussed. Patient verbalizes her understanding and is in agreement with plan.  BP 153/89 mmHg  Pulse 80  Temp(Src) 97.9 F (36.6 C) (Oral)  Resp 20  SpO2 98%  I personally performed the services described in this documentation, which was scribed in my presence. The recorded information has been reviewed and is accurate.       Marella Chimes, PA-C 01/04/16 2012  Tanna Furry, MD 01/13/16 1451

## 2016-05-19 ENCOUNTER — Emergency Department (HOSPITAL_COMMUNITY)
Admission: EM | Admit: 2016-05-19 | Discharge: 2016-05-20 | Disposition: A | Discharge status: Home / Self Care | Payer: Self-pay | Attending: Emergency Medicine | Admitting: Emergency Medicine

## 2016-05-19 DIAGNOSIS — Z792 Long term (current) use of antibiotics: Secondary | ICD-10-CM | POA: Insufficient documentation

## 2016-05-19 DIAGNOSIS — F17210 Nicotine dependence, cigarettes, uncomplicated: Secondary | ICD-10-CM | POA: Insufficient documentation

## 2016-05-19 DIAGNOSIS — Z791 Long term (current) use of non-steroidal anti-inflammatories (NSAID): Secondary | ICD-10-CM | POA: Insufficient documentation

## 2016-05-19 DIAGNOSIS — F329 Major depressive disorder, single episode, unspecified: Secondary | ICD-10-CM | POA: Insufficient documentation

## 2016-05-19 DIAGNOSIS — F1490 Cocaine use, unspecified, uncomplicated: Secondary | ICD-10-CM | POA: Insufficient documentation

## 2016-05-19 DIAGNOSIS — F909 Attention-deficit hyperactivity disorder, unspecified type: Secondary | ICD-10-CM | POA: Insufficient documentation

## 2016-05-19 DIAGNOSIS — Z79899 Other long term (current) drug therapy: Secondary | ICD-10-CM | POA: Insufficient documentation

## 2016-05-19 DIAGNOSIS — M545 Low back pain: Secondary | ICD-10-CM

## 2016-05-19 DIAGNOSIS — R21 Rash and other nonspecific skin eruption: Secondary | ICD-10-CM

## 2016-05-20 ENCOUNTER — Encounter (HOSPITAL_COMMUNITY): Payer: Self-pay

## 2016-05-20 MED ORDER — PREDNISONE 20 MG PO TABS: ORAL_TABLET | ORAL | 0 refills | Status: DC

## 2016-05-20 MED ORDER — OXYCODONE-ACETAMINOPHEN 5-325 MG PO TABS: 1.0000 | ORAL_TABLET | Freq: Four times a day (QID) | ORAL | 0 refills | Status: DC | PRN

## 2016-05-20 MED ORDER — METHOCARBAMOL 500 MG PO TABS
1000.0000 mg | ORAL_TABLET | Freq: Two times a day (BID) | ORAL | 0 refills | Status: DC
Start: 2016-05-20 — End: 2016-12-04

## 2016-05-20 NOTE — ED Triage Notes (Signed)
Pt complains of chronic back pain and just recently started a new job where shes on her feet and her back is hurting worse. She 's also in a cold environment.  She also complains of a generalized rash on her legs and hands for about one week

## 2016-05-20 NOTE — ED Provider Notes (Signed)
[facility] DEPT Provider Note   CSN: [id number] Arrival date & time: 05/19/16  2357  First Provider Contact:  First MD Initiated Contact with Patient 05/20/16 0131    By signing my name below, I, Randa Evens, attest that this documentation has been prepared under the direction and in the presence of Junius Creamer, NP. Electronically Signed: Randa Evens, ED Scribe. 05/20/16. 2:03 AM.     History   Chief Complaint Chief Complaint  Patient presents with  . Back Pain    HPI [name] is a 50 y.o. female.  The history is provided by the patient. No language interpreter was used.   HPI Comments: [name] is a 50 y.o. female who presents to the Emergency Department complaining of chronic back pain back pain that has recently worsened when starting a new job. She states that the pain is not radiating. 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 Pt states that the temperature is 34 degrees while she is at work. She states that the pain is worse when waking up in the morning. Pt states that she has tried tylenol with no relief. PT reports Hx of back surgery. Pt is requesting a work note stating that she is only able to work 6 hours per day.  Pt also reports generalized itchy rash that began 1 week prior. Pt states that she has tried calamine lotion with no relief. Pt denies coming in contact with anyone with similar rash.       Past Medical History:  Diagnosis Date  . ADD (attention deficit disorder with hyperactivity)   . Bilateral ovarian cysts   . Chlamydia   . Depression   . PID (acute pelvic inflammatory disease)   . PTSD (post-traumatic stress disorder)   . Sciatica     Patient Active Problem List   Diagnosis Date Noted  . Chronic back pain 12/06/2013  . PTSD (post-traumatic stress disorder) 12/06/2013  . Other screening mammogram 12/06/2013  . ADD (attention deficit disorder) 12/06/2013    Past Surgical History:    Procedure Laterality Date  . ABDOMINAL HYSTERECTOMY    . BACK SURGERY     dbl lumbar fusion L5-S1  . BLADDER SURGERY    . LAPAROSCOPY    . ROTATOR CUFF REPAIR Right 02/15/2013    OB History    No data available       Home Medications    Prior to Admission medications   Medication Sig Start Date End Date Taking? Authorizing Provider  ALPRAZolam Duanne Moron) 1 MG tablet Take 1 tablet (1 mg total) by mouth 3 (three) times daily as needed. anxiety 04/11/13   Niel Hummer, NP  amoxicillin-clavulanate (AUGMENTIN) 875-125 MG tablet Take 1 tablet by mouth every 12 (twelve) hours. 08/07/15   Antonietta Breach, PA-C  amphetamine-dextroamphetamine (ADDERALL) 20 MG tablet Take 20 mg by mouth 3 (three) times daily as needed (for anxiety).    Historical Provider, MD  clotrimazole (LOTRIMIN) 1 % cream Apply to affected area 2 times daily Patient not taking: Reported on 08/09/2015 06/27/15   Charlann Lange, PA-C  cyclobenzaprine (FLEXERIL) 10 MG tablet Take 1 tablet (10 mg total) by mouth 2 (two) times daily as needed for muscle spasms. Patient not taking: Reported on 06/27/2015 01/09/15   Domenic Moras, PA-C  furosemide (LASIX) 20 MG tablet Take 1 tablet (20 mg total) by mouth daily. Patient not taking: Reported on 02/02/2015 06/02/14   Sherwood Gambler, MD  ibuprofen (  ADVIL,MOTRIN) 800 MG tablet Take 1 tablet (800 mg total) by mouth 3 (three) times daily. 06/27/15   Charlann Lange, PA-C  methocarbamol (ROBAXIN) 500 MG tablet Take 2 tablets (1,000 mg total) by mouth 2 (two) times daily. 05/20/16   Junius Creamer, NP  neomycin-polymyxin-hydrocortisone (CORTISPORIN) 3.5-10000-1 otic suspension Place 5 drops into the left ear 4 (four) times daily. X 7 days 01/01/16   Mercedes Camprubi-Soms, PA-C  oxyCODONE-acetaminophen (PERCOCET/ROXICET) 5-325 MG tablet Take 1-2 tablets by mouth every 6 (six) hours as needed for severe pain. 05/20/16   Junius Creamer, NP  predniSONE (DELTASONE) 20 MG tablet 3 Tabs PO Days 1-3, then 2 tabs PO Days 4-6,  then 1 tab PO Day 7-9, then Half Tab PO Day 10-12 05/20/16   Junius Creamer, NP  sertraline (ZOLOFT) 100 MG tablet Take 2 tablets (200 mg total) by mouth daily. 04/11/13   Niel Hummer, NP  sulfamethoxazole-trimethoprim (BACTRIM DS,SEPTRA DS) 800-160 MG tablet Take 1 tablet by mouth 2 (two) times daily. 08/09/15   Olivia Canter Sam, PA-C  traMADol (ULTRAM) 50 MG tablet Take 1 tablet (50 mg total) by mouth every 6 (six) hours as needed. Patient not taking: Reported on 06/27/2015 01/23/15   Larene Pickett, PA-C    Family History Family History  Problem Relation Age of Onset  . Hypertension Mother   . Heart failure Mother   . Hypertension Father   . Heart failure Father   . Hypertension Sister     Social History Social History  Substance Use Topics  . Smoking status: Current Every Day Smoker    Packs/day: 0.25    Years: 14.00    Types: Cigarettes  . Smokeless tobacco: Current User  . Alcohol use No     Allergies   Imitrex [sumatriptan base]; Clindamycin/lincomycin; Morphine and related; and Vicodin [hydrocodone-acetaminophen]   Review of Systems Review of Systems  Musculoskeletal: Positive for back pain.  Skin: Positive for rash.  All other systems reviewed and are negative.    Physical Exam Updated Vital Signs BP 121/86 (BP Location: Left Arm)   Pulse 72   Temp 97.8 F (36.6 C) (Oral)   Resp 19   Ht 5' 6.5" (1.689 m)   Wt 127 kg   SpO2 100%   BMI 44.52 kg/m   Physical Exam  Constitutional: She is oriented to person, place, and time. She appears well-developed and well-nourished. No distress.  HENT:  Head: Normocephalic and atraumatic.  Eyes: Conjunctivae and EOM are normal.  Neck: Neck supple. No tracheal deviation present.  Cardiovascular: Normal rate.   Pulmonary/Chest: Effort normal. No respiratory distress.  Musculoskeletal: Normal range of motion. She exhibits tenderness.       Back:  Well healed lumbar scar.   Neurological: She is alert and oriented to  person, place, and time.  Skin: Skin is warm and dry. Rash noted. Rash is maculopapular.     Psychiatric: She has a normal mood and affect. Her behavior is normal.  Nursing note and vitals reviewed.    ED Treatments / Results  DIAGNOSTIC STUDIES: Oxygen Saturation is 100% on RA, normal by my interpretation.    COORDINATION OF CARE: 2:01 AM-Discussed treatment plan which includes steroid taper with pt at bedside and pt agreed to plan.      Labs (all labs ordered are listed, but only abnormal results are displayed) Labs Reviewed - No data to display  EKG  EKG Interpretation None       Radiology No results found.  Procedures Procedures (including critical care time)  Medications Ordered in ED Medications - No data to display   Initial Impression / Assessment and Plan / ED Course  I have reviewed the triage vital signs and the nursing notes.  Pertinent labs & imaging results that were available during my care of the patient were reviewed by me and considered in my medical decision making (see chart for details).  Clinical Course   Patient requesting letter stating she can work 6 hour block of time only due to back pain Will give note for 1 week only and referral to PCP As well as Percocet #10 tabs, Prednisone for rash and pain  And muscle relaxer     Final Clinical Impressions(s) / ED Diagnoses   Final diagnoses:  Low back pain without sciatica, unspecified back pain laterality  Rash and nonspecific skin eruption    New Prescriptions New Prescriptions   METHOCARBAMOL (ROBAXIN) 500 MG TABLET    Take 2 tablets (1,000 mg total) by mouth 2 (two) times daily.   OXYCODONE-ACETAMINOPHEN (PERCOCET/ROXICET) 5-325 MG TABLET    Take 1-2 tablets by mouth every 6 (six) hours as needed for severe pain.   PREDNISONE (DELTASONE) 20 MG TABLET    3 Tabs PO Days 1-3, then 2 tabs PO Days 4-6, then 1 tab PO Day 7-9, then Half Tab PO Day 10-12   I personally performed the  services described in this documentation, which was scribed in my presence. The recorded information has been reviewed and is accurate.   Junius Creamer, NP [id number]    Delora Fuel, MD [id number]

## 2016-05-20 NOTE — Discharge Instructions (Signed)
You have been given prescriptions for pain control, a steroid for treatment of the back pain as well as the rash and a muscle relaxer.  You [facility] been [facility] A note was written to shorten your work shift to 6 hour for the next week, you will need to get a PCP in order to extend this

## 2016-05-20 NOTE — ED Provider Notes (Signed)
[facility] DEPT Provider Note   CSN: [id number] Arrival date & time: 05/19/16  2357  First Provider Contact:  None       History   Chief Complaint Chief Complaint  Patient presents with  . Back Pain    HPI [name] is a 50 y.o. female.  HPI  Past Medical History:  Diagnosis Date  . ADD (attention deficit disorder with hyperactivity)   . Bilateral ovarian cysts   . Chlamydia   . Depression   . PID (acute pelvic inflammatory disease)   . PTSD (post-traumatic stress disorder)   . Sciatica     Patient Active Problem List   Diagnosis Date Noted  . Chronic back pain 12/06/2013  . PTSD (post-traumatic stress disorder) 12/06/2013  . Other screening mammogram 12/06/2013  . ADD (attention deficit disorder) 12/06/2013    Past Surgical History:  Procedure Laterality Date  . ABDOMINAL HYSTERECTOMY    . BACK SURGERY     dbl lumbar fusion L5-S1  . BLADDER SURGERY    . LAPAROSCOPY    . ROTATOR CUFF REPAIR Right 02/15/2013    OB History    No data available       Home Medications    Prior to Admission medications   Medication Sig Start Date End Date Taking? Authorizing Provider  ALPRAZolam Duanne Moron) 1 MG tablet Take 1 tablet (1 mg total) by mouth 3 (three) times daily as needed. anxiety 04/11/13   Niel Hummer, NP  amoxicillin-clavulanate (AUGMENTIN) 875-125 MG tablet Take 1 tablet by mouth every 12 (twelve) hours. 08/07/15   Antonietta Breach, PA-C  amphetamine-dextroamphetamine (ADDERALL) 20 MG tablet Take 20 mg by mouth 3 (three) times daily as needed (for anxiety).    Historical Provider, MD  clotrimazole (LOTRIMIN) 1 % cream Apply to affected area 2 times daily Patient not taking: Reported on 08/09/2015 06/27/15   Charlann Lange, PA-C  cyclobenzaprine (FLEXERIL) 10 MG tablet Take 1 tablet (10 mg total) by mouth 2 (two) times daily as needed for muscle spasms. Patient not taking: Reported on 06/27/2015 01/09/15   Domenic Moras, PA-C  furosemide (LASIX) 20 MG tablet  Take 1 tablet (20 mg total) by mouth daily. Patient not taking: Reported on 02/02/2015 06/02/14   Sherwood Gambler, MD  ibuprofen (ADVIL,MOTRIN) 800 MG tablet Take 1 tablet (800 mg total) by mouth 3 (three) times daily. 06/27/15   Charlann Lange, PA-C  neomycin-polymyxin-hydrocortisone (CORTISPORIN) 3.5-10000-1 otic suspension Place 5 drops into the left ear 4 (four) times daily. X 7 days 01/01/16   Mercedes Camprubi-Soms, PA-C  oxyCODONE-acetaminophen (PERCOCET/ROXICET) 5-325 MG tablet Take 1-2 tablets by mouth every 4 (four) hours as needed for severe pain. 01/04/16   Marella Chimes, PA-C  sertraline (ZOLOFT) 100 MG tablet Take 2 tablets (200 mg total) by mouth daily. 04/11/13   Niel Hummer, NP  sulfamethoxazole-trimethoprim (BACTRIM DS,SEPTRA DS) 800-160 MG tablet Take 1 tablet by mouth 2 (two) times daily. 08/09/15   Olivia Canter Sam, PA-C  traMADol (ULTRAM) 50 MG tablet Take 1 tablet (50 mg total) by mouth every 6 (six) hours as needed. Patient not taking: Reported on 06/27/2015 01/23/15   Larene Pickett, PA-C    Family History Family History  Problem Relation Age of Onset  . Hypertension Mother   . Heart failure Mother   . Hypertension Father   . Heart failure Father   . Hypertension Sister     Social History Social History  Substance Use Topics  . Smoking status: Current  Every Day Smoker    Packs/day: 0.25    Years: 14.00    Types: Cigarettes  . Smokeless tobacco: Current User  . Alcohol use No     Allergies   Imitrex [sumatriptan base]; Clindamycin/lincomycin; Morphine and related; and Vicodin [hydrocodone-acetaminophen]   Review of Systems Review of Systems   Physical Exam Updated Vital Signs BP 138/99 (BP Location: Right Arm)   Pulse 88   Temp 98 F (36.7 C) (Oral)   Resp 24 Comment: Pt is crying  Ht 5' 6.5" (1.689 m)   Wt 127 kg   SpO2 100%   BMI 44.52 kg/m   Physical Exam   ED Treatments / Results  Labs (all labs ordered are listed, but only abnormal results  are displayed) Labs Reviewed - No data to display  EKG  EKG Interpretation None       Radiology No results found.  Procedures Procedures (including critical care time)  Medications Ordered in ED Medications - No data to display   Initial Impression / Assessment and Plan / ED Course  I have reviewed the triage vital signs and the nursing notes.  Pertinent labs & imaging results that were available during my care of the patient were reviewed by me and considered in my medical decision making (see chart for details).  Clinical Course      Final Clinical Impressions(s) / ED Diagnoses   Final diagnoses:  None    New Prescriptions New Prescriptions   No medications on file     Junius Creamer, NP [id number]    Delora Fuel, MD [id number]

## 2016-06-04 ENCOUNTER — Emergency Department (HOSPITAL_COMMUNITY)
Admission: EM | Admit: 2016-06-04 | Discharge: 2016-06-04 | Disposition: A | Discharge status: Home / Self Care | Payer: No Typology Code available for payment source | Attending: Emergency Medicine | Admitting: Emergency Medicine

## 2016-06-04 ENCOUNTER — Encounter (HOSPITAL_COMMUNITY): Payer: Self-pay | Admitting: Emergency Medicine

## 2016-06-04 DIAGNOSIS — F909 Attention-deficit hyperactivity disorder, unspecified type: Secondary | ICD-10-CM | POA: Insufficient documentation

## 2016-06-04 DIAGNOSIS — Z79899 Other long term (current) drug therapy: Secondary | ICD-10-CM | POA: Insufficient documentation

## 2016-06-04 DIAGNOSIS — R110 Nausea: Secondary | ICD-10-CM

## 2016-06-04 DIAGNOSIS — Z792 Long term (current) use of antibiotics: Secondary | ICD-10-CM | POA: Insufficient documentation

## 2016-06-04 DIAGNOSIS — F17210 Nicotine dependence, cigarettes, uncomplicated: Secondary | ICD-10-CM | POA: Insufficient documentation

## 2016-06-04 MED ORDER — ONDANSETRON 4 MG PO TBDP: 4.0000 mg | ORAL_TABLET | Freq: Three times a day (TID) | ORAL | 0 refills | Status: DC | PRN

## 2016-06-04 MED ORDER — ONDANSETRON 8 MG PO TBDP
8.0000 mg | ORAL_TABLET | Freq: Once | ORAL | Status: AC
  Administered 2016-06-04: 8 mg via ORAL
  Filled 2016-06-04: qty 1

## 2016-06-04 NOTE — ED Provider Notes (Signed)
[facility] DEPT Provider Note   CSN: [id number] Arrival date & time: 06/04/16  1538  First Provider Contact:   First MD Initiated Contact with Patient 06/04/16 1706     By signing my name below, I, Julien Nordmann, attest that this documentation has been prepared under the direction and in the presence of Harlene Ramus, PA-C.  Electronically Signed: Julien Nordmann, ED Scribe. 06/04/16. 5:15 PM.    History   Chief Complaint Chief Complaint  Patient presents with  . Nausea    3 day hx of nausea    The history is provided by the patient. No language interpreter was used.   HPI Comments: [name] is a 50 y.o. female who presents to the Emergency Department complaining of sudden onset, gradual worsening, moderate constant, nausea onset 3 days ago. She reports associated intermittent NBNB vomiting which she notes has alleviated today. Pt says she remembers eating some sausage at a restaurant ~ 4-5 hours prior to her nausea starting a few days ago. Pt has been drinking ginger ale to alleviate her nausea with no relief but she has not had any medication.  Pt has not been around any sick contacts. Pt has a surgical hx of partial hysterectomy and does not have a menstrual cycle. She denies fever, chills, generalized body aches, cough, sore throat, SOB, chest pain, hematemesis, abdominal pain, diarrhea, constipation, dysuria, hematuria, vaginal bleeding, vaginal discharge, back pain.  Past Medical History:  Diagnosis Date  . ADD (attention deficit disorder with hyperactivity)   . Bilateral ovarian cysts   . Chlamydia   . Depression   . PID (acute pelvic inflammatory disease)   . PTSD (post-traumatic stress disorder)   . Sciatica     Patient Active Problem List   Diagnosis Date Noted  . Chronic back pain 12/06/2013  . PTSD (post-traumatic stress disorder) 12/06/2013  . Other screening mammogram 12/06/2013  . ADD (attention deficit disorder) 12/06/2013    Past Surgical  History:  Procedure Laterality Date  . ABDOMINAL HYSTERECTOMY    . BACK SURGERY     dbl lumbar fusion L5-S1  . BLADDER SURGERY    . LAPAROSCOPY    . ROTATOR CUFF REPAIR Right 02/15/2013    OB History    No data available       Home Medications    Prior to Admission medications   Medication Sig Start Date End Date Taking? Authorizing Provider  ALPRAZolam Duanne Moron) 1 MG tablet Take 1 tablet (1 mg total) by mouth 3 (three) times daily as needed. anxiety 04/11/13   Niel Hummer, NP  amoxicillin-clavulanate (AUGMENTIN) 875-125 MG tablet Take 1 tablet by mouth every 12 (twelve) hours. 08/07/15   Antonietta Breach, PA-C  amphetamine-dextroamphetamine (ADDERALL) 20 MG tablet Take 20 mg by mouth 3 (three) times daily as needed (for anxiety).    Historical Provider, MD  clotrimazole (LOTRIMIN) 1 % cream Apply to affected area 2 times daily Patient not taking: Reported on 08/09/2015 06/27/15   Charlann Lange, PA-C  cyclobenzaprine (FLEXERIL) 10 MG tablet Take 1 tablet (10 mg total) by mouth 2 (two) times daily as needed for muscle spasms. Patient not taking: Reported on 06/27/2015 01/09/15   Domenic Moras, PA-C  furosemide (LASIX) 20 MG tablet Take 1 tablet (20 mg total) by mouth daily. Patient not taking: Reported on 02/02/2015 06/02/14   Sherwood Gambler, MD  ibuprofen (ADVIL,MOTRIN) 800 MG tablet Take 1 tablet (800 mg total) by mouth 3 (three) times daily. 06/27/15   Charlann Lange, PA-C  methocarbamol (ROBAXIN) 500 MG tablet Take 2 tablets (1,000 mg total) by mouth 2 (two) times daily. 05/20/16   Junius Creamer, NP  neomycin-polymyxin-hydrocortisone (CORTISPORIN) 3.5-10000-1 otic suspension Place 5 drops into the left ear 4 (four) times daily. X 7 days 01/01/16   Mercedes Camprubi-Soms, PA-C  ondansetron (ZOFRAN ODT) 4 MG disintegrating tablet Take 1 tablet (4 mg total) by mouth every 8 (eight) hours as needed for nausea or vomiting. 06/04/16   Nona Dell, PA-C  oxyCODONE-acetaminophen (PERCOCET/ROXICET) 5-325  MG tablet Take 1-2 tablets by mouth every 6 (six) hours as needed for severe pain. 05/20/16   Junius Creamer, NP  predniSONE (DELTASONE) 20 MG tablet 3 Tabs PO Days 1-3, then 2 tabs PO Days 4-6, then 1 tab PO Day 7-9, then Half Tab PO Day 10-12 05/20/16   Junius Creamer, NP  sertraline (ZOLOFT) 100 MG tablet Take 2 tablets (200 mg total) by mouth daily. 04/11/13   Niel Hummer, NP  sulfamethoxazole-trimethoprim (BACTRIM DS,SEPTRA DS) 800-160 MG tablet Take 1 tablet by mouth 2 (two) times daily. 08/09/15   Olivia Canter Sam, PA-C  traMADol (ULTRAM) 50 MG tablet Take 1 tablet (50 mg total) by mouth every 6 (six) hours as needed. Patient not taking: Reported on 06/27/2015 01/23/15   Larene Pickett, PA-C    Family History Family History  Problem Relation Age of Onset  . Hypertension Mother   . Heart failure Mother   . Hypertension Father   . Heart failure Father   . Hypertension Sister     Social History Social History  Substance Use Topics  . Smoking status: Current Every Day Smoker    Packs/day: 0.25    Years: 14.00    Types: Cigarettes  . Smokeless tobacco: Current User  . Alcohol use No     Allergies   Imitrex [sumatriptan base]; Clindamycin/lincomycin; Morphine and related; and Vicodin [hydrocodone-acetaminophen]   Review of Systems Review of Systems  Constitutional: Negative for chills and fever.  HENT: Negative for sore throat.   Respiratory: Negative for cough and shortness of breath.   Cardiovascular: Negative for chest pain.  Gastrointestinal: Positive for nausea and vomiting. Negative for abdominal pain and diarrhea.  Genitourinary: Negative for dysuria, hematuria, vaginal bleeding and vaginal discharge.  Musculoskeletal: Negative for back pain.     Physical Exam Updated Vital Signs BP 133/94 (BP Location: Left Arm)   Pulse 72   Temp 98.6 F (37 C) (Oral)   Resp 16   Ht 5\' 6"  (1.676 m)   Wt 127 kg   SpO2 96%   BMI 45.19 kg/m   Physical Exam  Constitutional: She is  oriented to person, place, and time. She appears well-developed and well-nourished.  Non-toxic appearance. She does not have a sickly appearance. She does not appear ill.  HENT:  Head: Normocephalic and atraumatic.  Mouth/Throat: Uvula is midline, oropharynx is clear and moist and mucous membranes are normal. No oropharyngeal exudate, posterior oropharyngeal edema, posterior oropharyngeal erythema or tonsillar abscesses.  Eyes: Conjunctivae and EOM are normal. Right eye exhibits no discharge. Left eye exhibits no discharge. No scleral icterus.  Neck: Normal range of motion. Neck supple.  Cardiovascular: Normal rate, regular rhythm, normal heart sounds and intact distal pulses.   Pulmonary/Chest: Effort normal and breath sounds normal. No accessory muscle usage or stridor. No respiratory distress. She has no wheezes. She has no rhonchi. She has no rales. She exhibits no tenderness.  Abdominal: Soft. Bowel sounds are normal. She exhibits no distension  and no mass. There is no tenderness. There is no rebound and no guarding. No hernia.  Musculoskeletal: Normal range of motion. She exhibits no edema.  Lymphadenopathy:    She has no cervical adenopathy.  Neurological: She is alert and oriented to person, place, and time.  Speech clear without dysarthria.  Skin: Skin is warm and dry.  Psychiatric: She has a normal mood and affect. Her behavior is normal.  Nursing note and vitals reviewed.    ED Treatments / Results  DIAGNOSTIC STUDIES: Oxygen Saturation is 96% on RA, adequate by my interpretation.  COORDINATION OF CARE:  5:13 PM Discussed treatment plan with pt at bedside and pt agreed to plan.  Labs (all labs ordered are listed, but only abnormal results are displayed) Labs Reviewed - No data to display  EKG  EKG Interpretation None       Radiology No results found.  Procedures Procedures (including critical care time)  Medications Ordered in ED Medications  ondansetron  (ZOFRAN-ODT) disintegrating tablet 8 mg (8 mg Oral Given 06/04/16 1722)     Initial Impression / Assessment and Plan / ED Course  I have reviewed the triage vital signs and the nursing notes.  Pertinent labs & imaging results that were available during my care of the patient were reviewed by me and considered in my medical decision making (see chart for details).  Clinical Course    Patient presents with nausea that started after eating sausage a few days ago at a restaurant. She reports having a few episodes of NBNB yesterday but denies any vomiting today and notes her nausea has mildly improved. Patient able to tolerate PO at home. Denies fever, abdominal pain or diarrhea. She states she is here due to wanting a work note and order to return to work this evening. VSS. Exam unremarkable. Patient appeared well-hydrated with moist mucous membranes. Abdominal exam benign. Patient given by mouth Zofran in the ED. On reevaluation patient reports her symptoms have improved and has been able to tolerate PO in the ED. I suspect patient's symptoms are likely due to viral gastritis likely associated from the food she ate at a restaurant a few days ago. Due to patient with improved symptoms upon evaluation in the ED and unremarkable exam, I do not feel any further workup or imaging is warranted at this time. Plan discharge patient home with Zofran and symptomatically treatment. Patient given information to follow up with PCP as needed. Discussed return precautions with patient.  Final Clinical Impressions(s) / ED Diagnoses   Final diagnoses:  Nausea    New Prescriptions Discharge Medication List as of 06/04/2016  5:48 PM    START taking these medications   Details  ondansetron (ZOFRAN ODT) 4 MG disintegrating tablet Take 1 tablet (4 mg total) by mouth every 8 (eight) hours as needed for nausea or vomiting., Starting Thu 06/04/2016, Print       I personally performed the services described in this  documentation, which was scribed in my presence. The recorded information has been reviewed and is accurate.      Chesley Noon [place], [place] 06/04/16 1825    Jola Schmidt, MD 06/04/16 [phone]

## 2016-06-04 NOTE — Discharge Instructions (Signed)
Take your medications as prescribed as an for nausea. I recommend using a bland diet for the next few days until your symptoms have improved. Continue drinking fluids at home to remain hydrated. Please follow up with a primary care provider from the Resource Guide provided below in the next 5 days if your symptoms have not improved. Please return to the Emergency Department if symptoms worsen or new onset of fever, abdominal pain, vomiting, vomiting blood, unable to keep fluids down, diarrhea, blood in urine or stool.

## 2016-06-04 NOTE — ED Triage Notes (Signed)
Pt reports transient  nausea x 3 days, vomited yesterday. Currently drinking Gatorade w/o difficulty. Requesting return to work note due to illness yesterday

## 2016-06-25 ENCOUNTER — Emergency Department (HOSPITAL_COMMUNITY): Payer: No Typology Code available for payment source

## 2016-06-25 ENCOUNTER — Emergency Department (HOSPITAL_COMMUNITY)
Admission: EM | Admit: 2016-06-25 | Discharge: 2016-06-25 | Disposition: A | Discharge status: Home / Self Care | Payer: No Typology Code available for payment source | Attending: Emergency Medicine | Admitting: Emergency Medicine

## 2016-06-25 ENCOUNTER — Encounter (HOSPITAL_COMMUNITY): Payer: Self-pay | Admitting: Emergency Medicine

## 2016-06-25 DIAGNOSIS — F909 Attention-deficit hyperactivity disorder, unspecified type: Secondary | ICD-10-CM | POA: Insufficient documentation

## 2016-06-25 DIAGNOSIS — R112 Nausea with vomiting, unspecified: Secondary | ICD-10-CM | POA: Insufficient documentation

## 2016-06-25 DIAGNOSIS — Z79899 Other long term (current) drug therapy: Secondary | ICD-10-CM | POA: Insufficient documentation

## 2016-06-25 DIAGNOSIS — N390 Urinary tract infection, site not specified: Secondary | ICD-10-CM | POA: Insufficient documentation

## 2016-06-25 DIAGNOSIS — F17210 Nicotine dependence, cigarettes, uncomplicated: Secondary | ICD-10-CM | POA: Insufficient documentation

## 2016-06-25 DIAGNOSIS — E860 Dehydration: Secondary | ICD-10-CM

## 2016-06-25 DIAGNOSIS — R197 Diarrhea, unspecified: Secondary | ICD-10-CM

## 2016-06-25 LAB — URINALYSIS, ROUTINE W REFLEX MICROSCOPIC
BILIRUBIN URINE: NEGATIVE
Glucose, UA: NEGATIVE mg/dL
KETONES UR: NEGATIVE mg/dL
NITRITE: NEGATIVE
PH: 5.5 (ref 5.0–8.0)
Protein, ur: NEGATIVE mg/dL
Specific Gravity, Urine: 1.023 (ref 1.005–1.030)

## 2016-06-25 LAB — COMPREHENSIVE METABOLIC PANEL
ALBUMIN: 4 g/dL (ref 3.5–5.0)
ALK PHOS: 84 U/L (ref 38–126)
ALT: 22 U/L (ref 14–54)
AST: 24 U/L (ref 15–41)
Anion gap: 7 (ref 5–15)
BILIRUBIN TOTAL: 0.2 mg/dL — AB (ref 0.3–1.2)
BUN: 10 mg/dL (ref 6–20)
CO2: 29 mmol/L (ref 22–32)
CREATININE: 0.74 mg/dL (ref 0.44–1.00)
Calcium: 8.7 mg/dL — ABNORMAL LOW (ref 8.9–10.3)
Chloride: 103 mmol/L (ref 101–111)
GFR calc Af Amer: >60 mL/min (ref >60)
GFR calc non Af Amer: >60 mL/min (ref >60)
GLUCOSE: 99 mg/dL (ref 65–99)
Potassium: 3.3 mmol/L — ABNORMAL LOW (ref 3.5–5.1)
Sodium: 139 mmol/L (ref 135–145)
TOTAL PROTEIN: 7.7 g/dL (ref 6.5–8.1)

## 2016-06-25 LAB — URINE MICROSCOPIC-ADD ON

## 2016-06-25 LAB — CBC
HCT: 41.3 % (ref 36.0–46.0)
Hemoglobin: 13.9 g/dL (ref 12.0–15.0)
MCH: 30 pg (ref 26.0–34.0)
MCHC: 33.7 g/dL (ref 30.0–36.0)
MCV: 89.2 fL (ref 78.0–100.0)
PLATELETS: 391 10*3/uL (ref 150–400)
RBC: 4.63 MIL/uL (ref 3.87–5.11)
RDW: 13.4 % (ref 11.5–15.5)
WBC: 7.5 10*3/uL (ref 4.0–10.5)

## 2016-06-25 LAB — LIPASE, BLOOD: Lipase: 25 U/L (ref 11–51)

## 2016-06-25 MED ORDER — ONDANSETRON HCL 4 MG/2ML IJ SOLN
4.0000 mg | Freq: Once | INTRAMUSCULAR | Status: AC | PRN
  Administered 2016-06-25: 4 mg via INTRAVENOUS
  Filled 2016-06-25: qty 2

## 2016-06-25 MED ORDER — ONDANSETRON HCL 4 MG/2ML IJ SOLN
4.0000 mg | Freq: Once | INTRAMUSCULAR | Status: AC
  Administered 2016-06-25: 4 mg via INTRAVENOUS
  Filled 2016-06-25: qty 2

## 2016-06-25 MED ORDER — IOPAMIDOL (ISOVUE-300) INJECTION 61%
100.0000 mL | Freq: Once | INTRAVENOUS | Status: AC | PRN
  Administered 2016-06-25: 100 mL via INTRAVENOUS

## 2016-06-25 MED ORDER — CIPROFLOXACIN HCL 500 MG PO TABS: 500.0000 mg | ORAL_TABLET | Freq: Two times a day (BID) | ORAL | 0 refills | Status: DC

## 2016-06-25 MED ORDER — FENTANYL CITRATE (PF) 100 MCG/2ML IJ SOLN
50.0000 ug | Freq: Once | INTRAMUSCULAR | Status: AC
  Administered 2016-06-25: 50 ug via INTRAVENOUS
  Filled 2016-06-25: qty 2

## 2016-06-25 MED ORDER — LOPERAMIDE HCL 2 MG PO CAPS
4.0000 mg | ORAL_CAPSULE | Freq: Once | ORAL | Status: AC
  Administered 2016-06-25: 4 mg via ORAL
  Filled 2016-06-25: qty 2

## 2016-06-25 NOTE — ED Provider Notes (Signed)
[facility] DEPT Provider Note   CSN: [id number] Arrival date & time: 06/25/16  1259     History   Chief Complaint Chief Complaint  Patient presents with  . Emesis  . Diarrhea    HPI [name] is a 50 y.o. female.  Pt reports n/v/d for the past 3 days.  She was here on 8/10 for the same problem.  On the 10th, she ate at a [employer] and had these sx.  She ate at that restaurant again and had the sx again.  Pt said that her n/v has improved, but her diarrhea does not.  The pt said that she has not taken anything at home for her sx.  She has some abdominal pain with diarrhea, but none now.   She was able to eat a small amount this morning for breakfast.      Past Medical History:  Diagnosis Date  . ADD (attention deficit disorder with hyperactivity)   . Bilateral ovarian cysts   . Chlamydia   . Depression   . PID (acute pelvic inflammatory disease)   . PTSD (post-traumatic stress disorder)   . Sciatica     Patient Active Problem List   Diagnosis Date Noted  . Chronic back pain 12/06/2013  . PTSD (post-traumatic stress disorder) 12/06/2013  . Other screening mammogram 12/06/2013  . ADD (attention deficit disorder) 12/06/2013    Past Surgical History:  Procedure Laterality Date  . ABDOMINAL HYSTERECTOMY    . BACK SURGERY     dbl lumbar fusion L5-S1  . BLADDER SURGERY    . LAPAROSCOPY    . ROTATOR CUFF REPAIR Right 02/15/2013    OB History    No data available       Home Medications    Prior to Admission medications   Medication Sig Start Date End Date Taking? Authorizing Provider  ALPRAZolam Duanne Moron) 1 MG tablet Take 1 tablet (1 mg total) by mouth 3 (three) times daily as needed. anxiety 04/11/13  Yes Niel Hummer, NP  amphetamine-dextroamphetamine (ADDERALL) 20 MG tablet Take 20 mg by mouth 3 (three) times daily as needed (for anxiety).   Yes Historical Provider, MD  neomycin-polymyxin-hydrocortisone (CORTISPORIN) 3.5-10000-1 otic  suspension Place 5 drops into the left ear 4 (four) times daily. X 7 days Patient taking differently: Place 5 drops into the left ear 4 (four) times daily as needed (pain).  01/01/16  Yes Mercedes Camprubi-Soms, PA-C  sertraline (ZOLOFT) 100 MG tablet Take 2 tablets (200 mg total) by mouth daily. 04/11/13  Yes Niel Hummer, NP  ciprofloxacin (CIPRO) 500 MG tablet Take 1 tablet (500 mg total) by mouth every 12 (twelve) hours. 06/25/16   Isla Pence, MD  methocarbamol (ROBAXIN) 500 MG tablet Take 2 tablets (1,000 mg total) by mouth 2 (two) times daily. Patient not taking: Reported on 06/25/2016 05/20/16   Junius Creamer, NP  ondansetron (ZOFRAN ODT) 4 MG disintegrating tablet Take 1 tablet (4 mg total) by mouth every 8 (eight) hours as needed for nausea or vomiting. Patient not taking: Reported on 06/25/2016 06/04/16   Nona Dell, PA-C  oxyCODONE-acetaminophen (PERCOCET/ROXICET) 5-325 MG tablet Take 1-2 tablets by mouth every 6 (six) hours as needed for severe pain. Patient not taking: Reported on 06/25/2016 05/20/16   Junius Creamer, NP  predniSONE (DELTASONE) 20 MG tablet 3 Tabs PO Days 1-3, then 2 tabs PO Days 4-6, then 1 tab PO Day 7-9, then Half Tab PO Day 10-12 Patient not taking: Reported on 06/25/2016  05/20/16   Junius Creamer, NP    Family History Family History  Problem Relation Age of Onset  . Hypertension Mother   . Heart failure Mother   . Hypertension Father   . Heart failure Father   . Hypertension Sister     Social History Social History  Substance Use Topics  . Smoking status: Current Every Day Smoker    Packs/day: 0.25    Years: 14.00    Types: Cigarettes  . Smokeless tobacco: Current User  . Alcohol use No     Allergies   Imitrex [sumatriptan base]; Clarithromycin; Clindamycin/lincomycin; Morphine and related; and Vicodin [hydrocodone-acetaminophen]   Review of Systems Review of Systems  Gastrointestinal: Positive for abdominal pain, diarrhea, nausea and  vomiting.  All other systems reviewed and are negative.    Physical Exam Updated Vital Signs BP 129/92   Pulse 73   Temp 98.4 F (36.9 C) (Oral)   Resp 18   SpO2 99%   Physical Exam  Constitutional: She is oriented to person, place, and time. She appears well-developed and well-nourished.  HENT:  Head: Normocephalic and atraumatic.  Right Ear: External ear normal.  Left Ear: External ear normal.  Mouth/Throat: Oropharynx is clear and moist.  Eyes: Conjunctivae and EOM are normal. Pupils are equal, round, and reactive to light.  Neck: Normal range of motion.  Cardiovascular: Normal rate, regular rhythm, normal heart sounds and intact distal pulses.   Pulmonary/Chest: Effort normal and breath sounds normal.  Abdominal: Soft. Bowel sounds are normal.  Musculoskeletal: Normal range of motion.  Neurological: She is alert and oriented to person, place, and time.  Skin: Skin is warm and dry.  Psychiatric: She has a normal mood and affect. Her behavior is normal. Judgment and thought content normal.  Nursing note and vitals reviewed.    ED Treatments / Results  Labs (all labs ordered are listed, but only abnormal results are displayed) Labs Reviewed  COMPREHENSIVE METABOLIC PANEL - Abnormal; Notable for the following:       Result Value   Potassium 3.3 (*)    Calcium 8.7 (*)    Total Bilirubin 0.2 (*)    All other components within normal limits  URINALYSIS, ROUTINE W REFLEX MICROSCOPIC (NOT AT [facility]) - Abnormal; Notable for the following:    APPearance CLOUDY (*)    Hgb urine dipstick MODERATE (*)    Leukocytes, UA MODERATE (*)    All other components within normal limits  URINE MICROSCOPIC-ADD ON - Abnormal; Notable for the following:    Squamous Epithelial / LPF 0-5 (*)    Bacteria, UA FEW (*)    All other components within normal limits  LIPASE, BLOOD  CBC    EKG  EKG Interpretation None       Radiology Ct Abdomen Pelvis W Contrast  Result Date:  06/25/2016 CLINICAL DATA:  Emesis, diarrhea for 3 days.  No abdominal pain. EXAM: CT ABDOMEN AND PELVIS WITH CONTRAST TECHNIQUE: Multidetector CT imaging of the abdomen and pelvis was performed using the standard protocol following bolus administration of intravenous contrast. CONTRAST:  171mL ISOVUE-300 IOPAMIDOL (ISOVUE-300) INJECTION 61% COMPARISON:  None. FINDINGS: Lower chest:  No acute findings. Hepatobiliary: No masses or other significant abnormality. Pancreas: No mass, inflammatory changes, or other significant abnormality. Spleen: Within normal limits in size and appearance. Adrenals/Urinary Tract: Normal adrenal glands. No urolithiasis or obstructive uropathy. Normal right kidney. 5 mm nonenhancing hypodense left renal mass most consistent with a small cyst. Normal decompressed bladder. Stomach/Bowel: No  evidence of obstruction, inflammatory process, or abnormal fluid collections. No pneumatosis, pneumoperitoneum or portal venous gas. Normal appendix. No abdominal or pelvic free fluid. Vascular/Lymphatic: No pathologically enlarged lymph nodes. No evidence of abdominal aortic aneurysm. Reproductive: Prior hysterectomy. 4.2 x 3.4 cm hypodense, fluid attenuating right adnexal mass likely reflecting an ovarian cyst versus pelvic inclusion cyst versus enteric duplication cyst. Other: No other fluid collection or hematoma. Musculoskeletal: No acute osseous abnormality. Posterior lumbar interbody fusion from L3 through S1. Solid osseous fusion across the L3-4 and L5-S1 disc spaces. IMPRESSION: 1. No acute abdominal or pelvic pathology. Electronically Signed   By: Kathreen Devoid   On: 06/25/2016 15:21    Procedures Procedures (including critical care time)  Medications Ordered in ED Medications  ondansetron (ZOFRAN) injection 4 mg (4 mg Intravenous Given 06/25/16 1354)  loperamide (IMODIUM) capsule 4 mg (4 mg Oral Given 06/25/16 1429)  ondansetron (ZOFRAN) injection 4 mg (4 mg Intravenous Given 06/25/16  1517)  fentaNYL (SUBLIMAZE) injection 50 mcg (50 mcg Intravenous Given 06/25/16 1517)  iopamidol (ISOVUE-300) 61 % injection 100 mL (100 mLs Intravenous Contrast Given 06/25/16 1506)     Initial Impression / Assessment and Plan / ED Course  I have reviewed the triage vital signs and the nursing notes.  Pertinent labs & imaging results that were available during my care of the patient were reviewed by me and considered in my medical decision making (see chart for details).  Clinical Course   Pt with diarrhea for the 2nd time in a few weeks, so I ordered a CT to make sure she did not have diverticulitis or colitis.  The pt's ct was negative.  She does have mild hematuria and some leukocytes in her urine, so I will treat her for a UTI with cipro which would help if she does have food poisoning.  She is told that the hematuria needs to be followed.  She understands.  She is given the number to [facility].  She has had no diarrhea or vomiting while here.  She knows to return if worse.  Final Clinical Impressions(s) / ED Diagnoses   Final diagnoses:  Diarrhea, unspecified type  Dehydration  UTI (lower urinary tract infection)    New Prescriptions New Prescriptions   CIPROFLOXACIN (CIPRO) 500 MG TABLET    Take 1 tablet (500 mg total) by mouth every 12 (twelve) hours.     Isla Pence, MD 06/25/16 [phone]

## 2016-06-25 NOTE — ED Triage Notes (Signed)
Pt reports emesis and diarrhea x 3 days. denies abd pain. Believes has food poisoning . Alert and oriented x 4.

## 2016-06-25 NOTE — Discharge Instructions (Signed)
OTC imodium for diarrhea.

## 2016-07-28 ENCOUNTER — Encounter (HOSPITAL_COMMUNITY): Payer: Self-pay

## 2016-07-28 ENCOUNTER — Emergency Department (HOSPITAL_COMMUNITY)
Admission: EM | Admit: 2016-07-28 | Discharge: 2016-07-28 | Disposition: A | Discharge status: Home / Self Care | Payer: No Typology Code available for payment source | Attending: Emergency Medicine | Admitting: Emergency Medicine

## 2016-07-28 DIAGNOSIS — M545 Low back pain, unspecified: Secondary | ICD-10-CM

## 2016-07-28 DIAGNOSIS — Z79899 Other long term (current) drug therapy: Secondary | ICD-10-CM | POA: Insufficient documentation

## 2016-07-28 DIAGNOSIS — F909 Attention-deficit hyperactivity disorder, unspecified type: Secondary | ICD-10-CM | POA: Insufficient documentation

## 2016-07-28 DIAGNOSIS — G8929 Other chronic pain: Secondary | ICD-10-CM | POA: Insufficient documentation

## 2016-07-28 DIAGNOSIS — F17210 Nicotine dependence, cigarettes, uncomplicated: Secondary | ICD-10-CM | POA: Insufficient documentation

## 2016-07-28 DIAGNOSIS — M62830 Muscle spasm of back: Secondary | ICD-10-CM

## 2016-07-28 DIAGNOSIS — I10 Essential (primary) hypertension: Secondary | ICD-10-CM | POA: Insufficient documentation

## 2016-07-28 MED ORDER — ONDANSETRON 8 MG PO TBDP
8.0000 mg | ORAL_TABLET | Freq: Once | ORAL | Status: AC
  Administered 2016-07-28: 8 mg via ORAL
  Filled 2016-07-28: qty 1

## 2016-07-28 MED ORDER — NAPROXEN 500 MG PO TABS: 500.0000 mg | ORAL_TABLET | Freq: Two times a day (BID) | ORAL | 0 refills | Status: DC | PRN

## 2016-07-28 MED ORDER — HYDROCODONE-ACETAMINOPHEN 5-325 MG PO TABS
1.0000 | ORAL_TABLET | Freq: Once | ORAL | Status: AC
  Administered 2016-07-28: 1 via ORAL
  Filled 2016-07-28: qty 1

## 2016-07-28 MED ORDER — METHOCARBAMOL 500 MG PO TABS: 500.0000 mg | ORAL_TABLET | Freq: Three times a day (TID) | ORAL | 0 refills | Status: DC | PRN

## 2016-07-28 MED ORDER — HYDROCODONE-ACETAMINOPHEN 5-325 MG PO TABS: 1.0000 | ORAL_TABLET | Freq: Four times a day (QID) | ORAL | 0 refills | Status: DC | PRN

## 2016-07-28 MED ORDER — METHOCARBAMOL 500 MG PO TABS
500.0000 mg | ORAL_TABLET | Freq: Once | ORAL | Status: AC
  Administered 2016-07-28: 500 mg via ORAL
  Filled 2016-07-28: qty 1

## 2016-07-28 MED ORDER — ONDANSETRON 4 MG PO TBDP: 4.0000 mg | ORAL_TABLET | Freq: Three times a day (TID) | ORAL | 0 refills | Status: DC | PRN

## 2016-07-28 MED ORDER — KETOROLAC TROMETHAMINE 30 MG/ML IJ SOLN
30.0000 mg | Freq: Once | INTRAMUSCULAR | Status: AC
  Administered 2016-07-28: 30 mg via INTRAMUSCULAR
  Filled 2016-07-28: qty 1

## 2016-07-28 NOTE — ED Triage Notes (Signed)
Pt reports 8/10 lower back pain that has progressed since beginning a new job where she stands continusly. Pt hypertensive in triage BP 171/106. Pt denies hx of HTN. Pt A+OX4.

## 2016-07-28 NOTE — Discharge Instructions (Signed)
Follow the instructions below regarding your back pain. Your blood pressure is elevated today but is likely raised due to the pain you're experiencing. Have your regular doctor keep an eye on your blood pressure, if it is consistently elevated even when you're not in pain then they may want to consider starting you on medications; but I don't feel we need to start medications today. Eat a low salt diet. Follow up with your regular doctor in 1 week for recheck of symptoms and ongoing management of your blood pressure issues. Return to the ER for changes or worsening symptoms as listed below.  Back Pain: Your back pain should be treated with medicines such as ibuprofen or aleve and this back pain should get better over the next 2 weeks.  However if you develop severe or worsening pain, low back pain with fever, numbness, weakness or inability to walk or urinate, you should return to the ER immediately.  Please follow up with your doctor this week for a recheck if still having symptoms.  Avoid heavy lifting over 10 pounds over the next two weeks.  Low back pain is discomfort in the lower back that may be due to injuries to muscles and ligaments around the spine.  Occasionally, it may be caused by a a problem to a part of the spine called a disc.  The pain may last several days or a week;  However, most patients get completely well in 4 weeks.  Self - care:  The application of heat can help soothe the pain.  Maintaining your daily activities, including walking, is encourged, as it will help you get better faster than just staying in bed. Perform gentle stretching as discussed. Drink plenty of fluids.  Medications are also useful to help with pain control.  A commonly prescribed medication includes norco.  Do not drive or operate heavy machinery while taking this medication.  Non steroidal anti inflammatory medications including Ibuprofen and naproxen;  These medications help both pain and swelling and are  very useful in treating back pain.  They should be taken with food, as they can cause stomach upset, and more seriously, stomach bleeding.    Muscle relaxants:  These medications can help with muscle tightness that is a cause of lower back pain.  Most of these medications can cause drowsiness, and it is not safe to drive or use dangerous machinery while taking them.  MAKE SURE YOU HAVE SUPPORTIVE SHOES AT WORK, BUY NEW SHOES IF YOUR SHOES ARE OLDER THAN 48 YEAR OLD.   SEEK IMMEDIATE MEDICAL ATTENTION IF: New numbness, tingling, weakness, or problem with the use of your arms or legs.  Severe back pain not relieved with medications.  Difficulty with or loss of control of your bowel or bladder control.  Increasing pain in any areas of the body (such as chest or abdominal pain).  Shortness of breath, dizziness or fainting.  Nausea (feeling sick to your stomach), vomiting, fever, or sweats.  You will need to follow up with  Your primary healthcare provider in 1-2 weeks for reassessment.

## 2016-07-28 NOTE — ED Provider Notes (Signed)
[facility] DEPT Provider Note   CSN: [id number] Arrival date & time: 07/28/16  [place]     History   Chief Complaint Chief Complaint  Patient presents with  . Back Pain  . Hypertension    HPI [name] is a 50 y.o. female with a PMHx of sciatica, chronic back pain, remote PID, ADD, ovarian cysts, PTSD, and depression, with a PSHx of lumbar fusion L5-S1, abd hysterectomy, and bladder surgery, who presents to the ED with complaints of acute on chronic low back pain. 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 She states that she hasn't had many issues with her back since her surgery 2 years ago, but thinks that standing 8 hours per day has aggravated her back pain. She describes the pain as 7/10 constant sharp and spasmy lower back pain, nonradiating, worse with standing, mildly improved with heat, and unrelieved with Tylenol and Motrin. She states it feels similar to her prior issues with back pain and spasms.  Additionally she reports that upon arrival she was noted to have a blood pressure of 170/106, states that she has a family history of hypertension but has never been told she has hypertension. Denies any symptoms related hypertension. Chart review reveals that she's had elevated BP readings during several ED visits (150s/90s in the past).   She denies any fevers, chills, vision changes, lightheadedness, headache, chest pain, shortness breath, leg swelling, abdominal pain, nausea, vomiting, diarrhea, constipation, dysuria, hematuria, incontinence of urine or stool, saddle anesthesia or cauda equina symptoms, numbness, tingling, or focal weakness. She denies history of IV drug use or cancer. Denies any recent heavy lifting. Has used Soma in the past for muscle spasms.    The history is provided by the patient and medical records. No language interpreter was used.  Back Pain    This is a recurrent problem. The current episode started yesterday. The problem occurs constantly. The problem has not changed since onset.The pain is associated with twisting (new job where she does a lot of twisting movements, and is on her feet 8hrs/day). The pain is present in the lumbar spine. Quality: sharp/spasmy. The pain does not radiate. The pain is at a severity of 7/10. The pain is moderate. Exacerbated by: standing. The pain is the same all the time. Pertinent negatives include no chest pain, no fever, no numbness, no headaches, no abdominal pain, no bowel incontinence, no perianal numbness, no bladder incontinence, no dysuria, no paresthesias, no paresis, no tingling and no weakness. She has tried heat, NSAIDs and analgesics for the symptoms. The treatment provided mild relief. Risk factors include obesity.  Hypertension  Pertinent negatives include no chest pain, no abdominal pain, no headaches and no shortness of breath.    Past Medical History:  Diagnosis Date  . ADD (attention deficit disorder with hyperactivity)   . Bilateral ovarian cysts   . Chlamydia   . Depression   . PID (acute pelvic inflammatory disease)   . PTSD (post-traumatic stress disorder)   . Sciatica     Patient Active Problem List   Diagnosis Date Noted  . Chronic back pain 12/06/2013  . PTSD (post-traumatic stress disorder) 12/06/2013  . Other screening mammogram 12/06/2013  . ADD (attention deficit disorder) 12/06/2013    Past Surgical History:  Procedure Laterality Date  . ABDOMINAL HYSTERECTOMY    . BACK SURGERY  dbl lumbar fusion L5-S1  . BLADDER SURGERY    . LAPAROSCOPY    . ROTATOR CUFF REPAIR Right 02/15/2013    OB History    No data available       Home Medications    Prior to Admission medications   Medication Sig Start Date End Date Taking? Authorizing Provider  acetaminophen (TYLENOL) 325 MG tablet Take 650 mg by mouth every 6 (six) hours as needed for moderate pain or  headache.   Yes Historical Provider, MD  ALPRAZolam Duanne Moron) 1 MG tablet Take 1 tablet (1 mg total) by mouth 3 (three) times daily as needed. anxiety 04/11/13  Yes Niel Hummer, NP  amphetamine-dextroamphetamine (ADDERALL) 20 MG tablet Take 20 mg by mouth 3 (three) times daily as needed (for anxiety).   Yes Historical Provider, MD  ibuprofen (ADVIL,MOTRIN) 200 MG tablet Take 800 mg by mouth every 8 (eight) hours as needed for moderate pain.   Yes Historical Provider, MD  sertraline (ZOLOFT) 100 MG tablet Take 2 tablets (200 mg total) by mouth daily. 04/11/13  Yes Niel Hummer, NP  ciprofloxacin (CIPRO) 500 MG tablet Take 1 tablet (500 mg total) by mouth every 12 (twelve) hours. Patient not taking: Reported on 07/28/2016 06/25/16   Isla Pence, MD  methocarbamol (ROBAXIN) 500 MG tablet Take 2 tablets (1,000 mg total) by mouth 2 (two) times daily. Patient not taking: Reported on 07/28/2016 05/20/16   Junius Creamer, NP  ondansetron (ZOFRAN ODT) 4 MG disintegrating tablet Take 1 tablet (4 mg total) by mouth every 8 (eight) hours as needed for nausea or vomiting. Patient not taking: Reported on 07/28/2016 06/04/16   Nona Dell, PA-C  oxyCODONE-acetaminophen (PERCOCET/ROXICET) 5-325 MG tablet Take 1-2 tablets by mouth every 6 (six) hours as needed for severe pain. Patient not taking: Reported on 07/28/2016 05/20/16   Junius Creamer, NP  predniSONE (DELTASONE) 20 MG tablet 3 Tabs PO Days 1-3, then 2 tabs PO Days 4-6, then 1 tab PO Day 7-9, then Half Tab PO Day 10-12 Patient not taking: Reported on 07/28/2016 05/20/16   Junius Creamer, NP    Family History Family History  Problem Relation Age of Onset  . Hypertension Mother   . Heart failure Mother   . Hypertension Father   . Heart failure Father   . Hypertension Sister     Social History Social History  Substance Use Topics  . Smoking status: Current Every Day Smoker    Packs/day: 0.25    Years: 14.00    Types: Cigarettes  . Smokeless  tobacco: Current User  . Alcohol use No     Allergies   Imitrex [sumatriptan base]; Clarithromycin; Clindamycin/lincomycin; Morphine and related; and Vicodin [hydrocodone-acetaminophen]   Review of Systems Review of Systems  Constitutional: Negative for chills and fever.  Eyes: Negative for visual disturbance.  Respiratory: Negative for shortness of breath.   Cardiovascular: Negative for chest pain and leg swelling.  Gastrointestinal: Negative for abdominal pain, bowel incontinence, constipation, diarrhea, nausea and vomiting.  Genitourinary: Negative for bladder incontinence, difficulty urinating (no incontinence), dysuria and hematuria.  Musculoskeletal: Positive for back pain. Negative for arthralgias and myalgias.  Skin: Negative for color change.  Allergic/Immunologic: Negative for immunocompromised state.  Neurological: Negative for tingling, weakness, light-headedness, numbness, headaches and paresthesias.  Psychiatric/Behavioral: Negative for confusion.   10 Systems reviewed and are negative for acute change except as noted in the HPI.   Physical Exam Updated Vital Signs BP (!) 170/106 (BP Location: Right Arm)  Pulse 67   Temp 98.2 F (36.8 C) (Oral)   Resp 16   Ht 5\' 6"  (1.676 m)   Wt 127 kg   SpO2 99%   BMI 45.19 kg/m  Re-check VS: BP 162/96 (BP Location: Right Arm)   Physical Exam  Constitutional: She is oriented to person, place, and time. Vital signs are normal. She appears well-developed and well-nourished.  Non-toxic appearance. No distress.  Afebrile, nontoxic, NAD although appears uncomfortable. BP 162/96 on my exam  HENT:  Head: Normocephalic and atraumatic.  Mouth/Throat: Oropharynx is clear and moist and mucous membranes are normal.  Eyes: Conjunctivae and EOM are normal. Right eye exhibits no discharge. Left eye exhibits no discharge.  Neck: Normal range of motion. Neck supple.  Cardiovascular: Normal rate, regular rhythm, normal heart sounds and  intact distal pulses.  Exam reveals no gallop and no friction rub.   No murmur heard. Pulmonary/Chest: Effort normal and breath sounds normal. No respiratory distress. She has no decreased breath sounds. She has no wheezes. She has no rhonchi. She has no rales.  Abdominal: Soft. Normal appearance and bowel sounds are normal. She exhibits no distension. There is no tenderness. There is no rigidity, no rebound, no guarding, no CVA tenderness, no tenderness at McBurney's point and negative Murphy's sign.  Musculoskeletal: Normal range of motion.       Lumbar back: She exhibits tenderness and spasm. She exhibits normal range of motion, no bony tenderness and no deformity.       Back:  Lumbar spine with preserved ROM intact without spinous process TTP, no bony stepoffs or deformities, with mild diffuse b/l paraspinous muscle TTP and palpable muscle spasms. Strength 5/5 in all extremities, sensation grossly intact in all extremities, negative SLR bilaterally, gait steady and nonantalgic. No overlying skin changes. Distal pulses intact.   Neurological: She is alert and oriented to person, place, and time. She has normal strength. No sensory deficit.  Skin: Skin is warm, dry and intact. No rash noted.  Psychiatric: She has a normal mood and affect.  Nursing note and vitals reviewed.    ED Treatments / Results  Labs (all labs ordered are listed, but only abnormal results are displayed) Labs Reviewed - No data to display  EKG  EKG Interpretation None       Radiology No results found.  Procedures Procedures (including critical care time)  Medications Ordered in ED Medications  HYDROcodone-acetaminophen (NORCO/VICODIN) 5-325 MG per tablet 1 tablet (not administered)  methocarbamol (ROBAXIN) tablet 500 mg (not administered)  ketorolac (TORADOL) 30 MG/ML injection 30 mg (not administered)     Initial Impression / Assessment and Plan / ED Course  I have reviewed the triage vital signs and  the nursing notes.  Pertinent labs & imaging results that were available during my care of the patient were reviewed by me and considered in my medical decision making (see chart for details).  Clinical Course    50 y.o. female here with acute on chronic back pain since starting a new job and being on her feet for 8hrs per day. No red flag s/s of low back pain. No s/s of central cord compression or cauda equina. Lower extremities are neurovascularly intact and patient is ambulating without difficulty. Mild b/l paraspinous muscle TTP and spasms. Additionally, noted to have HTN 170/106 initially, down to 162/96 on my assessment, likely pain related; chart review reveals that she's had some transiently elevated BPs in the past when she's come to the ED, but  not every time so it doesn't seem to be a persistent issue, much more likely to be pain-related. No s/sx of HTN, doubt need for emergent work up at this time, or tx today. Discussed DASH diet and close f/up with PCP for recheck of BPs and ongoing management, will defer to them regarding starting any meds for BP if at all needed.   Reviewed [facility] and it doesn't appear she has many narcotic rx's (has alprazolam and adderall rx'd by the same doctor filled regularly, but last narcotic was in July and then March before that). Nothing concerning regarding narcotic use, so I feel it's reasonable to give a short supply for this acute worsening pain. Will give norco, robaxin, and toradol here, then rx for norco/robaxin for home, along with naprosyn.  Patient was counseled on back pain precautions and told to do activity as tolerated but do not lift, push, or pull heavy objects more than 10 pounds for the next week. Patient counseled to use ice or heat on back for no longer than 15 minutes every hour.   Rx given for muscle relaxer and counseled on proper use of muscle relaxant medication. Rx given for narcotic pain medicine and counseled on proper use of narcotic  pain medications. Told that they can increase to every 4 hrs if needed while pain is worse. Counseled not to combine this medication with others containing tylenol. Urged patient not to drink alcohol, drive, or perform any other activities that requires focus while taking either of these medications.   Patient urged to follow-up with PCP if pain does not improve with treatment and rest or if pain becomes recurrent, and for recheck/ongoing management of BPs. Urged to return with worsening severe pain, loss of bowel or bladder control, trouble walking. The patient verbalizes understanding and agrees with the plan.   Final Clinical Impressions(s) / ED Diagnoses   Final diagnoses:  Chronic bilateral low back pain without sciatica  Hypertension, unspecified type  Muscle spasm of back    New Prescriptions New Prescriptions   HYDROCODONE-ACETAMINOPHEN (NORCO) 5-325 MG TABLET    Take 1 tablet by mouth every 6 (six) hours as needed for severe pain.   METHOCARBAMOL (ROBAXIN) 500 MG TABLET    Take 1 tablet (500 mg total) by mouth every 8 (eight) hours as needed for muscle spasms.   NAPROXEN (NAPROSYN) 500 MG TABLET    Take 1 tablet (500 mg total) by mouth 2 (two) times daily as needed for mild pain, moderate pain or headache (TAKE WITH MEALS.).     Neeley Sedivy Camprubi-Soms, PA-C 07/28/16 [place], MD 07/28/16 [phone]

## 2016-08-26 ENCOUNTER — Encounter (HOSPITAL_COMMUNITY): Payer: Self-pay | Admitting: Emergency Medicine

## 2016-08-26 ENCOUNTER — Emergency Department (HOSPITAL_COMMUNITY): Payer: No Typology Code available for payment source

## 2016-08-26 ENCOUNTER — Emergency Department (HOSPITAL_COMMUNITY)
Admission: EM | Admit: 2016-08-26 | Discharge: 2016-08-26 | Disposition: A | Discharge status: Home / Self Care | Payer: No Typology Code available for payment source | Attending: Emergency Medicine | Admitting: Emergency Medicine

## 2016-08-26 DIAGNOSIS — F17210 Nicotine dependence, cigarettes, uncomplicated: Secondary | ICD-10-CM | POA: Insufficient documentation

## 2016-08-26 DIAGNOSIS — S39012A Strain of muscle, fascia and tendon of lower back, initial encounter: Secondary | ICD-10-CM | POA: Insufficient documentation

## 2016-08-26 DIAGNOSIS — M545 Low back pain, unspecified: Secondary | ICD-10-CM

## 2016-08-26 DIAGNOSIS — Y999 Unspecified external cause status: Secondary | ICD-10-CM | POA: Insufficient documentation

## 2016-08-26 DIAGNOSIS — G8929 Other chronic pain: Secondary | ICD-10-CM

## 2016-08-26 DIAGNOSIS — Y929 Unspecified place or not applicable: Secondary | ICD-10-CM | POA: Insufficient documentation

## 2016-08-26 DIAGNOSIS — Z79899 Other long term (current) drug therapy: Secondary | ICD-10-CM | POA: Insufficient documentation

## 2016-08-26 DIAGNOSIS — F909 Attention-deficit hyperactivity disorder, unspecified type: Secondary | ICD-10-CM | POA: Insufficient documentation

## 2016-08-26 DIAGNOSIS — W010XXA Fall on same level from slipping, tripping and stumbling without subsequent striking against object, initial encounter: Secondary | ICD-10-CM | POA: Insufficient documentation

## 2016-08-26 DIAGNOSIS — Y9389 Activity, other specified: Secondary | ICD-10-CM | POA: Insufficient documentation

## 2016-08-26 DIAGNOSIS — S80811A Abrasion, right lower leg, initial encounter: Secondary | ICD-10-CM | POA: Insufficient documentation

## 2016-08-26 MED ORDER — OXYCODONE-ACETAMINOPHEN 5-325 MG PO TABS: 1.0000 | ORAL_TABLET | ORAL | 0 refills | Status: DC | PRN

## 2016-08-26 MED ORDER — OXYCODONE-ACETAMINOPHEN 5-325 MG PO TABS
1.0000 | ORAL_TABLET | Freq: Once | ORAL | Status: AC
  Administered 2016-08-26: 1 via ORAL
  Filled 2016-08-26: qty 1

## 2016-08-26 MED ORDER — METHOCARBAMOL 500 MG PO TABS: 500.0000 mg | ORAL_TABLET | Freq: Four times a day (QID) | ORAL | 0 refills | Status: DC | PRN

## 2016-08-26 MED ORDER — METHOCARBAMOL 500 MG PO TABS
500.0000 mg | ORAL_TABLET | Freq: Once | ORAL | Status: AC
  Administered 2016-08-26: 500 mg via ORAL
  Filled 2016-08-26: qty 1

## 2016-08-26 NOTE — ED Provider Notes (Signed)
[facility] DEPT Provider Note   CSN: [id number] Arrival date & time: 08/26/16  1458     History   Chief Complaint Chief Complaint  Patient presents with  . Fall  . Back Pain    HPI [name] is a 50 y.o. female.  She tripped over a pallet at work, landing on her knees and on her hands. She suffered minor abrasions to her right leg. She then rolled over onto her back. She is complaining of pain across her lower back. Pain is rated at 8/10. It is worse with movement. There is no radiation of pain and no numbness or tingling or weakness. She denies bowel or bladder dysfunction. She does have history of 2 prior back surgeries with lumbar hardware in place. She has not taken anything for pain.   The history is provided by the patient.  Fall   Back Pain      Past Medical History:  Diagnosis Date  . ADD (attention deficit disorder with hyperactivity)   . Bilateral ovarian cysts   . Chlamydia   . Depression   . PID (acute pelvic inflammatory disease)   . PTSD (post-traumatic stress disorder)   . Sciatica     Patient Active Problem List   Diagnosis Date Noted  . Chronic back pain 12/06/2013  . PTSD (post-traumatic stress disorder) 12/06/2013  . Other screening mammogram 12/06/2013  . ADD (attention deficit disorder) 12/06/2013    Past Surgical History:  Procedure Laterality Date  . ABDOMINAL HYSTERECTOMY    . BACK SURGERY     dbl lumbar fusion L5-S1  . BLADDER SURGERY    . LAPAROSCOPY    . ROTATOR CUFF REPAIR Right 02/15/2013    OB History    No data available       Home Medications    Prior to Admission medications   Medication Sig Start Date End Date Taking? Authorizing Provider  acetaminophen (TYLENOL) 325 MG tablet Take 650 mg by mouth every 6 (six) hours as needed for moderate pain or headache.    Historical Provider, MD  ALPRAZolam Duanne Moron) 1 MG tablet Take 1 tablet (1 mg total) by mouth 3 (three) times daily as needed. anxiety 04/11/13    Niel Hummer, NP  amphetamine-dextroamphetamine (ADDERALL) 20 MG tablet Take 20 mg by mouth 3 (three) times daily as needed (for anxiety).    Historical Provider, MD  ciprofloxacin (CIPRO) 500 MG tablet Take 1 tablet (500 mg total) by mouth every 12 (twelve) hours. Patient not taking: Reported on 07/28/2016 06/25/16   Isla Pence, MD  HYDROcodone-acetaminophen [facility]) 5-325 MG tablet Take 1 tablet by mouth every 6 (six) hours as needed for severe pain. 07/28/16   Mercedes Camprubi-Soms, PA-C  ibuprofen (ADVIL,MOTRIN) 200 MG tablet Take 800 mg by mouth every 8 (eight) hours as needed for moderate pain.    Historical Provider, MD  methocarbamol (ROBAXIN) 500 MG tablet Take 2 tablets (1,000 mg total) by mouth 2 (two) times daily. Patient not taking: Reported on 07/28/2016 05/20/16   Junius Creamer, NP  methocarbamol (ROBAXIN) 500 MG tablet Take 1 tablet (500 mg total) by mouth every 8 (eight) hours as needed for muscle spasms. 07/28/16   Mercedes Camprubi-Soms, PA-C  naproxen (NAPROSYN) 500 MG tablet Take 1 tablet (500 mg total) by mouth 2 (two) times daily as needed for mild pain, moderate pain or headache (TAKE WITH MEALS.). 07/28/16   Mercedes Camprubi-Soms, PA-C  ondansetron (ZOFRAN ODT) 4 MG disintegrating tablet Take 1 tablet (4 mg  total) by mouth every 8 (eight) hours as needed for nausea or vomiting. Patient not taking: Reported on 07/28/2016 06/04/16   Nona Dell, PA-C  ondansetron (ZOFRAN ODT) 4 MG disintegrating tablet Take 1 tablet (4 mg total) by mouth every 8 (eight) hours as needed for nausea or vomiting. 07/28/16   Mercedes Camprubi-Soms, PA-C  oxyCODONE-acetaminophen (PERCOCET/ROXICET) 5-325 MG tablet Take 1-2 tablets by mouth every 6 (six) hours as needed for severe pain. Patient not taking: Reported on 07/28/2016 05/20/16   Junius Creamer, NP  predniSONE (DELTASONE) 20 MG tablet 3 Tabs PO Days 1-3, then 2 tabs PO Days 4-6, then 1 tab PO Day 7-9, then Half Tab PO Day 10-12 Patient not  taking: Reported on 07/28/2016 05/20/16   Junius Creamer, NP  sertraline (ZOLOFT) 100 MG tablet Take 2 tablets (200 mg total) by mouth daily. 04/11/13   Niel Hummer, NP    Family History Family History  Problem Relation Age of Onset  . Hypertension Mother   . Heart failure Mother   . Hypertension Father   . Heart failure Father   . Hypertension Sister     Social History Social History  Substance Use Topics  . Smoking status: Current Every Day Smoker    Packs/day: 0.25    Years: 14.00    Types: Cigarettes  . Smokeless tobacco: Current User  . Alcohol use No     Allergies   Imitrex [sumatriptan base]; Clarithromycin; Clindamycin/lincomycin; Morphine and related; and Vicodin [hydrocodone-acetaminophen]   Review of Systems Review of Systems  Musculoskeletal: Positive for back pain.  All other systems reviewed and are negative.    Physical Exam Updated Vital Signs BP 131/79 (BP Location: Left Arm)   Pulse 61   Temp 98.1 F (36.7 C) (Oral)   Resp 16   Ht 5\' 6"  (1.676 m)   Wt 280 lb (127 kg)   SpO2 98%   BMI 45.19 kg/m   Physical Exam  Nursing note and vitals reviewed.  Obese 50 year old female, resting comfortably and in no acute distress. Vital signs are Normal. Oxygen saturation is 98%, which is normal. Head is normocephalic and atraumatic. PERRLA, EOMI. Oropharynx is clear. Neck is nontender and supple without adenopathy or JVD. Back is moderately tender in the mid and lower lumbar area. There is moderate left paralumbar spasm. Straight leg raise is negative. There is no CVA tenderness. Lungs are clear without rales, wheezes, or rhonchi. Chest is nontender. Heart has regular rate and rhythm without murmur. Abdomen is soft, flat, nontender without masses or hepatosplenomegaly and peristalsis is normoactive. Extremities have no cyanosis or edema, full range of motion is present. Minor superficial abrasion present on the proximal right lower leg. Skin is warm and  dry without rash. Neurologic: Mental status is normal, cranial nerves are intact, there are no motor or sensory deficits.  ED Treatments / Results   Radiology Dg Lumbar Spine Complete  Result Date: 08/26/2016 CLINICAL DATA:  Pain following fall EXAM: LUMBAR SPINE - COMPLETE 4+ VIEW COMPARISON:  CT abdomen and pelvis with bony reformats June 25, 2016 FINDINGS: Frontal, lateral, spot lumbosacral lateral, and bilateral oblique views were obtained. There are 5 non-rib-bearing lumbar type vertebral bodies. There is mild upper lumbar dextroscoliosis. The patient has had posterior pedicle screw and plate fixation at L3, L4, L5, and S1 bilaterally. There is a disc spacer at L3-4. Support hardware appears intact. There is no acute fracture or spondylolisthesis. There is moderate disc space narrowing at all  levels with somewhat greater disc space narrowing at L3-4 and L5-S1 than elsewhere. There is facet osteoarthritic change at L3-4, L4-5, and L5-S1 bilaterally. No evident bony destruction. IMPRESSION: Multilevel osteoarthritic change. Mild scoliosis. Postoperative change from L3-S1. No acute fracture or spondylolisthesis. No bony destruction evident. Electronically Signed   By: Lowella Grip III M.D.   On: 08/26/2016 16:56    Procedures Procedures (including critical care time)  Medications Ordered in ED Medications  oxyCODONE-acetaminophen (PERCOCET/ROXICET) 5-325 MG per tablet 1 tablet (not administered)  methocarbamol (ROBAXIN) tablet 500 mg (not administered)     Initial Impression / Assessment and Plan / ED Course  I have reviewed the triage vital signs and the nursing notes.  Pertinent labs & imaging results that were available during my care of the patient were reviewed by me and considered in my medical decision making (see chart for details).  Clinical Course   Fall with low back pain. She'll be sent for x-rays. At this point, major issue seems to be muscle spasm. No evidence of  neurologic compromise. She is given a dose of methocarbamol and oxycodone-acetaminophen. Old records are reviewed showing prior ED visits for chronic back pain.  She feels much better after above noted treatment. X-rays show no acute injury. She is discharged with a prescription for oxycodone have acetaminophen and methocarbamol.  Final Clinical Impressions(s) / ED Diagnoses   Final diagnoses:  Fall from slip, trip, or stumble, initial encounter  Strain of lumbar region, initial encounter  Chronic low back pain without sciatica, unspecified back pain laterality    New Prescriptions Discharge Medication List as of 08/26/2016  5:16 PM    START taking these medications   Details  !! methocarbamol (ROBAXIN) 500 MG tablet Take 1 tablet (500 mg total) by mouth every 6 (six) hours as needed for muscle spasms., Starting Wed 08/26/2016, Print    !! oxyCODONE-acetaminophen (PERCOCET) 5-325 MG tablet Take 1 tablet by mouth every 4 (four) hours as needed for moderate pain., Starting Wed 08/26/2016, Print     !! - Potential duplicate medications found. Please discuss with provider.       Delora Fuel, MD [id number]

## 2016-08-26 NOTE — ED Notes (Signed)
Pt transported to XR.  

## 2016-08-26 NOTE — Discharge Instructions (Signed)
Apply ice several times a day. Take ibuprofen or naproxen as needed for less severe pain. °

## 2016-08-26 NOTE — ED Triage Notes (Signed)
Patient tripped over a pallet in the floor today. Patient fell on hands/knees & rolled to her back. Complaining of lower back pain. Denies loss of consciousness/dizziness, able to move all extremities. Patient has had previous surgeries on her lower back (2 rods & 8 pins).

## 2016-08-26 NOTE — ED Notes (Signed)
Pt ambulated to restroom with a steady gait and without difficulty.

## 2016-10-16 MED FILL — oxyCODONE HCL 5 MG TABS: 5 | 12 days supply | Qty: 25 | Fill #0

## 2016-10-28 ENCOUNTER — Encounter (HOSPITAL_COMMUNITY): Payer: Self-pay | Admitting: Emergency Medicine

## 2016-10-28 ENCOUNTER — Emergency Department (HOSPITAL_COMMUNITY)
Admission: EM | Admit: 2016-10-28 | Discharge: 2016-10-28 | Disposition: A | Discharge status: Home / Self Care | Payer: No Typology Code available for payment source | Attending: Emergency Medicine | Admitting: Emergency Medicine

## 2016-10-28 DIAGNOSIS — R350 Frequency of micturition: Secondary | ICD-10-CM

## 2016-10-28 DIAGNOSIS — R102 Pelvic and perineal pain: Secondary | ICD-10-CM

## 2016-10-28 DIAGNOSIS — R1030 Lower abdominal pain, unspecified: Secondary | ICD-10-CM | POA: Insufficient documentation

## 2016-10-28 DIAGNOSIS — R3915 Urgency of urination: Secondary | ICD-10-CM | POA: Insufficient documentation

## 2016-10-28 DIAGNOSIS — F17210 Nicotine dependence, cigarettes, uncomplicated: Secondary | ICD-10-CM | POA: Insufficient documentation

## 2016-10-28 DIAGNOSIS — F909 Attention-deficit hyperactivity disorder, unspecified type: Secondary | ICD-10-CM | POA: Insufficient documentation

## 2016-10-28 LAB — URINALYSIS, ROUTINE W REFLEX MICROSCOPIC
BACTERIA UA: NONE SEEN
Bilirubin Urine: NEGATIVE
Glucose, UA: NEGATIVE mg/dL
KETONES UR: NEGATIVE mg/dL
Nitrite: NEGATIVE
Protein, ur: NEGATIVE mg/dL
Specific Gravity, Urine: 1.004 — ABNORMAL LOW (ref 1.005–1.030)
pH: 6 (ref 5.0–8.0)

## 2016-10-28 LAB — BASIC METABOLIC PANEL
ANION GAP: 10 (ref 5–15)
BUN: 8 mg/dL (ref 6–20)
CHLORIDE: 98 mmol/L — AB (ref 101–111)
CO2: 31 mmol/L (ref 22–32)
Calcium: 9.4 mg/dL (ref 8.9–10.3)
Creatinine, Ser: 0.69 mg/dL (ref 0.44–1.00)
GFR calc non Af Amer: >60 mL/min (ref >60)
GLUCOSE: 105 mg/dL — AB (ref 65–99)
Potassium: 3.6 mmol/L (ref 3.5–5.1)
Sodium: 139 mmol/L (ref 135–145)

## 2016-10-28 LAB — CBC WITH DIFFERENTIAL/PLATELET
BASOS ABS: 0 10*3/uL (ref 0.0–0.1)
BASOS PCT: 0 %
Eosinophils Absolute: 0.2 10*3/uL (ref 0.0–0.7)
Eosinophils Relative: 1 %
HEMATOCRIT: 42 % (ref 36.0–46.0)
HEMOGLOBIN: 14.3 g/dL (ref 12.0–15.0)
LYMPHS PCT: 25 %
Lymphs Abs: 3.2 10*3/uL (ref 0.7–4.0)
MCH: 30.2 pg (ref 26.0–34.0)
MCHC: 34 g/dL (ref 30.0–36.0)
MCV: 88.8 fL (ref 78.0–100.0)
Monocytes Absolute: 0.8 10*3/uL (ref 0.1–1.0)
Monocytes Relative: 6 %
NEUTROS ABS: 8.5 10*3/uL — AB (ref 1.7–7.7)
NEUTROS PCT: 68 %
Platelets: 379 10*3/uL (ref 150–400)
RBC: 4.73 MIL/uL (ref 3.87–5.11)
RDW: 13.4 % (ref 11.5–15.5)
WBC: 12.7 10*3/uL — ABNORMAL HIGH (ref 4.0–10.5)

## 2016-10-28 MED ORDER — CIPROFLOXACIN HCL 500 MG PO TABS: 500.0000 mg | ORAL_TABLET | Freq: Two times a day (BID) | ORAL | 0 refills | Status: AC

## 2016-10-28 MED ORDER — CIPROFLOXACIN HCL 500 MG PO TABS
500.0000 mg | ORAL_TABLET | Freq: Once | ORAL | Status: AC
  Administered 2016-10-28: 500 mg via ORAL
  Filled 2016-10-28: qty 1

## 2016-10-28 MED ORDER — ACETAMINOPHEN 500 MG PO TABS
1000.0000 mg | ORAL_TABLET | Freq: Once | ORAL | Status: AC
  Administered 2016-10-28: 1000 mg via ORAL
  Filled 2016-10-28: qty 2

## 2016-10-28 MED ORDER — CEPHALEXIN 250 MG PO CAPS: 500.0000 mg | ORAL_CAPSULE | Freq: Once | ORAL | Status: DC

## 2016-10-28 NOTE — ED Provider Notes (Signed)
[facility] DEPT Provider Note   CSN: [id number] Arrival date & time: 10/28/16  0417     History   Chief Complaint Chief Complaint  Patient presents with  . Bladder Pressure  . Urinary Frequency    HPI [name] is a 51 y.o. female.  The history is provided by the patient.  Dysuria   This is a new problem. The current episode started yesterday. The problem occurs every urination. The problem has not changed since onset.The pain is moderate. There has been no fever. Associated symptoms include frequency, urgency and flank pain. Pertinent negatives include no chills, no nausea, no vomiting and no possible pregnancy (s/p hysterectomy). Her past medical history is significant for recurrent UTIs. Her past medical history does not include kidney stones.   States that symptoms feel exactly like prior UTIs.   Past Medical History:  Diagnosis Date  . ADD (attention deficit disorder with hyperactivity)   . Bilateral ovarian cysts   . Chlamydia   . Depression   . PID (acute pelvic inflammatory disease)   . PTSD (post-traumatic stress disorder)   . Sciatica     Patient Active Problem List   Diagnosis Date Noted  . Chronic back pain 12/06/2013  . PTSD (post-traumatic stress disorder) 12/06/2013  . Other screening mammogram 12/06/2013  . ADD (attention deficit disorder) 12/06/2013    Past Surgical History:  Procedure Laterality Date  . ABDOMINAL HYSTERECTOMY    . BACK SURGERY     dbl lumbar fusion L5-S1  . BLADDER SURGERY    . LAPAROSCOPY    . ROTATOR CUFF REPAIR Right 02/15/2013    OB History    No data available       Home Medications    Prior to Admission medications   Medication Sig Start Date End Date Taking? Authorizing Provider  acetaminophen (TYLENOL) 325 MG tablet Take 650 mg by mouth every 6 (six) hours as needed for moderate pain or headache.    Historical Provider, MD  ALPRAZolam Duanne Moron) 1 MG tablet Take 1 tablet (1 mg total) by mouth 3  (three) times daily as needed. anxiety 04/11/13   Niel Hummer, NP  amphetamine-dextroamphetamine (ADDERALL) 20 MG tablet Take 20 mg by mouth 3 (three) times daily as needed (for anxiety).    Historical Provider, MD  ciprofloxacin (CIPRO) 500 MG tablet Take 1 tablet (500 mg total) by mouth every 12 (twelve) hours. 10/28/16 11/04/16  Fatima Blank, MD  HYDROcodone-acetaminophen (NORCO) 5-325 MG tablet Take 1 tablet by mouth every 6 (six) hours as needed for severe pain. 07/28/16   Mercedes Camprubi-Soms, PA-C  ibuprofen (ADVIL,MOTRIN) 200 MG tablet Take 800 mg by mouth every 8 (eight) hours as needed for moderate pain.    Historical Provider, MD  methocarbamol (ROBAXIN) 500 MG tablet Take 2 tablets (1,000 mg total) by mouth 2 (two) times daily. Patient not taking: Reported on 07/28/2016 05/20/16   Junius Creamer, NP  methocarbamol (ROBAXIN) 500 MG tablet Take 1 tablet (500 mg total) by mouth every 8 (eight) hours as needed for muscle spasms. 07/28/16   Mercedes Camprubi-Soms, PA-C  methocarbamol (ROBAXIN) 500 MG tablet Take 1 tablet (500 mg total) by mouth every 6 (six) hours as needed for muscle spasms. [id number]   Delora Fuel, MD  naproxen (NAPROSYN) 500 MG tablet Take 1 tablet (500 mg total) by mouth 2 (two) times daily as needed for mild pain, moderate pain or headache (TAKE WITH MEALS.). 07/28/16   Mercedes Camprubi-Soms, PA-C  ondansetron [facility]  ODT) 4 MG disintegrating tablet Take 1 tablet (4 mg total) by mouth every 8 (eight) hours as needed for nausea or vomiting. 07/28/16   Mercedes Camprubi-Soms, PA-C  oxyCODONE-acetaminophen (PERCOCET) 5-325 MG tablet Take 1 tablet by mouth every 4 (four) hours as needed for moderate pain. [id number]   Delora Fuel, MD  oxyCODONE-acetaminophen (PERCOCET/ROXICET) 5-325 MG tablet Take 1-2 tablets by mouth every 6 (six) hours as needed for severe pain. Patient not taking: Reported on 07/28/2016 05/20/16   Junius Creamer, NP  sertraline (ZOLOFT) 100 MG tablet Take 2 tablets  (200 mg total) by mouth daily. 04/11/13   Niel Hummer, NP    Family History Family History  Problem Relation Age of Onset  . Hypertension Mother   . Heart failure Mother   . Hypertension Father   . Heart failure Father   . Hypertension Sister     Social History Social History  Substance Use Topics  . Smoking status: Current Every Day Smoker    Packs/day: 0.25    Years: 14.00    Types: Cigarettes  . Smokeless tobacco: Current User  . Alcohol use No     Allergies   Imitrex [sumatriptan base]; Clarithromycin; Clindamycin/lincomycin; Morphine and related; and Vicodin [hydrocodone-acetaminophen]   Review of Systems Review of Systems  Constitutional: Negative for chills.  Gastrointestinal: Negative for nausea and vomiting.  Genitourinary: Positive for dysuria, flank pain, frequency and urgency.   Ten systems are reviewed and are negative for acute change except as noted in the HPI   Physical Exam Updated Vital Signs BP 128/70   Pulse 64   Temp 98 F (36.7 C) (Oral)   Resp 16   Ht 5' 6.5" (1.689 m)   Wt 280 lb (127 kg)   SpO2 98%   BMI 44.52 kg/m   Physical Exam  Constitutional: She is oriented to person, place, and time. She appears well-developed and well-nourished. No distress.  HENT:  Head: Normocephalic and atraumatic.  Nose: Nose normal.  Eyes: Conjunctivae and EOM are normal. Pupils are equal, round, and reactive to light. Right eye exhibits no discharge. Left eye exhibits no discharge. No scleral icterus.  Neck: Normal range of motion. Neck supple.  Cardiovascular: Normal rate and regular rhythm.  Exam reveals no gallop and no friction rub.   No murmur heard. Pulmonary/Chest: Effort normal and breath sounds normal. No stridor. No respiratory distress. She has no rales.  Abdominal: Soft. She exhibits no distension. There is tenderness in the suprapubic area. There is no rigidity, no rebound, no CVA tenderness and negative Murphy's sign.  Musculoskeletal:  She exhibits no edema or tenderness.  Neurological: She is alert and oriented to person, place, and time.  Skin: Skin is warm and dry. No rash noted. She is not diaphoretic. No erythema.  Psychiatric: She has a normal mood and affect.  Vitals reviewed.    ED Treatments / Results  Labs (all labs ordered are listed, but only abnormal results are displayed) Labs Reviewed  URINALYSIS, ROUTINE W REFLEX MICROSCOPIC - Abnormal; Notable for the following:       Result Value   Color, Urine STRAW (*)    Specific Gravity, Urine 1.004 (*)    Hgb urine dipstick SMALL (*)    Leukocytes, UA TRACE (*)    Squamous Epithelial / LPF 0-5 (*)    All other components within normal limits  CBC WITH DIFFERENTIAL/PLATELET - Abnormal; Notable for the following:    WBC 12.7 (*)    Neutro Abs  8.5 (*)    All other components within normal limits  BASIC METABOLIC PANEL - Abnormal; Notable for the following:    Chloride 98 (*)    Glucose, Bld 105 (*)    All other components within normal limits  URINE CULTURE    EKG  EKG Interpretation None       Radiology No results found.  Procedures Procedures (including critical care time)  Medications Ordered in ED Medications  acetaminophen (TYLENOL) tablet 1,000 mg (not administered)  ciprofloxacin (CIPRO) tablet 500 mg (not administered)     Initial Impression / Assessment and Plan / ED Course  I have reviewed the triage vital signs and the nursing notes.  Pertinent labs & imaging results that were available during my care of the patient were reviewed by me and considered in my medical decision making (see chart for details).  Clinical Course     Symptomatically, presentation is consistent with UTI. Pt does have leukocytosis on CBC. UA looks reassuring, but pt denies other symptoms symptoms to suggest other etiologies. Low suspicion for appendicitis, SBO, or torsion. Pt adamantly denies recent sexual relations or vaginal discharge; low suspicion  for STI.  Had a discussion with pt regarding possibility for diverticulitis as well. We chose to treat for likely UTI and have pt follow up with PCP. The patient is safe for discharge with strict return precautions.   Final Clinical Impressions(s) / ED Diagnoses   Final diagnoses:  Urinary frequency  Suprapubic pain  Urinary urgency   Disposition: Discharge  Condition: Good  I have discussed the results, Dx and Tx plan with the patient who expressed understanding and agree(s) with the plan. Discharge instructions discussed at great length. The patient was given strict return precautions who verbalized understanding of the instructions. No further questions at time of discharge.    New Prescriptions   CIPROFLOXACIN (CIPRO) 500 MG TABLET    Take 1 tablet (500 mg total) by mouth every 12 (twelve) hours.    Follow Up: primary care provider  Schedule an appointment as soon as possible for a visit  in 3-5 days, If symptoms do not improve or  worsen      Fatima Blank, MD 10/28/16 [phone]

## 2016-10-28 NOTE — ED Triage Notes (Signed)
Patient reports bladder pressure and urinary frequency onset this evening , denies fever or chills .

## 2016-10-29 LAB — URINE CULTURE: Culture: <10000 — AB

## 2016-12-04 ENCOUNTER — Emergency Department (HOSPITAL_COMMUNITY)
Admission: EM | Admit: 2016-12-04 | Discharge: 2016-12-04 | Disposition: A | Discharge status: Home / Self Care | Payer: No Typology Code available for payment source | Attending: Emergency Medicine | Admitting: Emergency Medicine

## 2016-12-04 ENCOUNTER — Encounter (HOSPITAL_COMMUNITY): Payer: Self-pay | Admitting: Emergency Medicine

## 2016-12-04 ENCOUNTER — Emergency Department (HOSPITAL_COMMUNITY): Payer: No Typology Code available for payment source

## 2016-12-04 DIAGNOSIS — Y929 Unspecified place or not applicable: Secondary | ICD-10-CM | POA: Insufficient documentation

## 2016-12-04 DIAGNOSIS — R52 Pain, unspecified: Secondary | ICD-10-CM

## 2016-12-04 DIAGNOSIS — W06XXXA Fall from bed, initial encounter: Secondary | ICD-10-CM | POA: Insufficient documentation

## 2016-12-04 DIAGNOSIS — M545 Low back pain, unspecified: Secondary | ICD-10-CM

## 2016-12-04 DIAGNOSIS — Z79899 Other long term (current) drug therapy: Secondary | ICD-10-CM | POA: Insufficient documentation

## 2016-12-04 DIAGNOSIS — F909 Attention-deficit hyperactivity disorder, unspecified type: Secondary | ICD-10-CM | POA: Insufficient documentation

## 2016-12-04 DIAGNOSIS — F17210 Nicotine dependence, cigarettes, uncomplicated: Secondary | ICD-10-CM | POA: Insufficient documentation

## 2016-12-04 DIAGNOSIS — Y999 Unspecified external cause status: Secondary | ICD-10-CM | POA: Insufficient documentation

## 2016-12-04 DIAGNOSIS — Y939 Activity, unspecified: Secondary | ICD-10-CM | POA: Insufficient documentation

## 2016-12-04 MED ORDER — METHOCARBAMOL 500 MG PO TABS: 500.0000 mg | ORAL_TABLET | Freq: Two times a day (BID) | ORAL | 0 refills | Status: DC

## 2016-12-04 MED ORDER — LIDOCAINE 5 % EX PTCH
1.0000 | MEDICATED_PATCH | CUTANEOUS | Status: DC
  Administered 2016-12-04: 1 via TRANSDERMAL
  Filled 2016-12-04: qty 1

## 2016-12-04 MED ORDER — LIDOCAINE 5 % EX PTCH: 1.0000 | MEDICATED_PATCH | CUTANEOUS | 0 refills | Status: DC

## 2016-12-04 MED ORDER — TRAMADOL HCL 50 MG PO TABS: 50.0000 mg | ORAL_TABLET | Freq: Once | ORAL | Status: DC

## 2016-12-04 MED ORDER — KETOROLAC TROMETHAMINE 30 MG/ML IJ SOLN
60.0000 mg | Freq: Once | INTRAMUSCULAR | Status: AC
  Administered 2016-12-04: 60 mg via INTRAMUSCULAR
  Filled 2016-12-04: qty 2

## 2016-12-04 MED ORDER — IBUPROFEN 600 MG PO TABS: 600.0000 mg | ORAL_TABLET | Freq: Four times a day (QID) | ORAL | 0 refills | Status: DC | PRN

## 2016-12-04 NOTE — ED Triage Notes (Addendum)
Pt BIB EMS from group home for back pain; pt states she was sitting on a bed and fell backwards off of bed; hx of spinal fusion S1 - L3; hx of sciatica; Vitals stable; pt moving all extremities freely

## 2016-12-04 NOTE — ED Notes (Signed)
Pt returned from [facility].

## 2016-12-04 NOTE — ED Provider Notes (Signed)
[facility] DEPT Provider Note   CSN: [id number] Arrival date & time: 12/04/16  2139  By signing my name below, I, [name], attest that this documentation has been prepared under the direction and in the presence of non-physician practitioner, Antonietta Breach, PA-C. Electronically Signed: [name], Scribe. 12/04/2016. 10:45 PM.   History   Chief Complaint Chief Complaint  Patient presents with  . Back Pain   The history is provided by the patient. No language interpreter was used.   HPI Comments: [name] is a 51 y.o. female with a PMHx of chronic back pain who presents to the Emergency Department complaining of constant moderate lower back pain that occurred PTA. She states she was sitting on the edge of the bed when her body slid downward to the floor. She reports she has a hx of spinal fusion S1-L3. She take ibuprofen daily. She denies any bowel/bladder incontinence or other complaints.    Past Medical History:  Diagnosis Date  . ADD (attention deficit disorder with hyperactivity)   . Bilateral ovarian cysts   . Chlamydia   . Depression   . PID (acute pelvic inflammatory disease)   . PTSD (post-traumatic stress disorder)   . Sciatica     Patient Active Problem List   Diagnosis Date Noted  . Chronic back pain 12/06/2013  . PTSD (post-traumatic stress disorder) 12/06/2013  . Other screening mammogram 12/06/2013  . ADD (attention deficit disorder) 12/06/2013    Past Surgical History:  Procedure Laterality Date  . ABDOMINAL HYSTERECTOMY    . BACK SURGERY     dbl lumbar fusion L5-S1  . BLADDER SURGERY    . LAPAROSCOPY    . ROTATOR CUFF REPAIR Right 02/15/2013    OB History    No data available       Home Medications    Prior to Admission medications   Medication Sig Start Date End Date Taking? Authorizing Provider  acetaminophen (TYLENOL) 325 MG tablet Take 650 mg by mouth every 6 (six) hours as needed for moderate pain or headache.     Historical Provider, MD  ALPRAZolam Duanne Moron) 1 MG tablet Take 1 tablet (1 mg total) by mouth 3 (three) times daily as needed. anxiety 04/11/13   Niel Hummer, NP  amphetamine-dextroamphetamine (ADDERALL) 20 MG tablet Take 20 mg by mouth 3 (three) times daily as needed (for anxiety).    Historical Provider, MD  HYDROcodone-acetaminophen (NORCO) 5-325 MG tablet Take 1 tablet by mouth every 6 (six) hours as needed for severe pain. 07/28/16   Mercedes Street, PA-C  ibuprofen (ADVIL,MOTRIN) 600 MG tablet Take 1 tablet (600 mg total) by mouth every 6 (six) hours as needed. 12/04/16   Antonietta Breach, PA-C  lidocaine (LIDODERM) 5 % Place 1 patch onto the skin daily. Remove & Discard patch within 12 hours or as directed by MD 12/04/16   Antonietta Breach, PA-C  methocarbamol (ROBAXIN) 500 MG tablet Take 1 tablet (500 mg total) by mouth 2 (two) times daily. 12/04/16   Antonietta Breach, PA-C  naproxen (NAPROSYN) 500 MG tablet Take 1 tablet (500 mg total) by mouth 2 (two) times daily as needed for mild pain, moderate pain or headache (TAKE WITH MEALS.). 07/28/16   Mercedes Street, PA-C  ondansetron (ZOFRAN ODT) 4 MG disintegrating tablet Take 1 tablet (4 mg total) by mouth every 8 (eight) hours as needed for nausea or vomiting. 07/28/16   Mercedes Street, PA-C  sertraline (ZOLOFT) 100 MG tablet Take 2 tablets (200 mg total) by  mouth daily. 04/11/13   Niel Hummer, NP    Family History Family History  Problem Relation Age of Onset  . Hypertension Mother   . Heart failure Mother   . Hypertension Father   . Heart failure Father   . Hypertension Sister     Social History Social History  Substance Use Topics  . Smoking status: Current Every Day Smoker    Packs/day: 0.25    Years: 14.00    Types: Cigarettes  . Smokeless tobacco: Current User  . Alcohol use No     Allergies   Imitrex [sumatriptan base]; Clarithromycin; Clindamycin/lincomycin; Morphine and related; and Vicodin [hydrocodone-acetaminophen]   Review of  Systems Review of Systems A complete 10 system review of systems was obtained and all systems are negative except as noted in the HPI and PMH.    Physical Exam Updated Vital Signs BP 121/65 (BP Location: Left Arm)   Pulse 79   Temp 97.3 F (36.3 C) (Oral)   Resp 22   SpO2 98%   Physical Exam  Constitutional: She is oriented to person, place, and time. She appears well-developed and well-nourished. No distress.  Nontoxic and in NAD  HENT:  Head: Normocephalic and atraumatic.  Eyes: Conjunctivae and EOM are normal. No scleral icterus.  Neck: Normal range of motion.  Cardiovascular: Normal rate, regular rhythm and intact distal pulses.   DP pulses 2+ bilaterally.  Pulmonary/Chest: Effort normal. No respiratory distress.  Respirations even and unlabored.  Musculoskeletal: Normal range of motion. She exhibits tenderness.  Tenderness to palpation to the lower lumbar midline. No bony deformities, step-offs, or crepitus. No ecchymosis or contusion to suggest recent traumatic injury.  Neurological: She is alert and oriented to person, place, and time. She exhibits normal muscle tone. Coordination normal.  Sensation to light touch intact in bilateral lower extremities.  Skin: Skin is warm and dry. No rash noted. She is not diaphoretic. No erythema. No pallor.  Psychiatric: She has a normal mood and affect. Her behavior is normal.  Nursing note and vitals reviewed.    ED Treatments / Results  DIAGNOSTIC STUDIES: Oxygen Saturation is 98% on RA, normal by my interpretation.    COORDINATION OF CARE: 10:49 PM- Pt advised of plan for treatment and pt agrees.  Labs (all labs ordered are listed, but only abnormal results are displayed) Labs Reviewed - No data to display  EKG  EKG Interpretation None       Radiology Dg Lumbar Spine Complete  Result Date: 12/04/2016 CLINICAL DATA:  Pain after fall from bed EXAM: LUMBAR SPINE - COMPLETE 4+ VIEW COMPARISON:  None. There is mild  dextroconvex curvature of the mid thoracic spine with apex at L3. There is normal lumbar segmentation. Five non rib-bearing lumbar type vertebrae. Posterior lumbar fusion hardware noted from L3 through S1 bilaterally with disc spacer noted at L3-4. No acute fracture. Chronic stable retrolisthesis of L2 on L3 by 5 mm. Mild-to-moderate disc space narrowing at L1-2, L2-3 and L5-S1. No hardware failure. Sacroiliac joints are maintained bilaterally. IMPRESSION: Status post lumbar fusion L3-S1.  No acute osseous abnormality. Chronic retrolisthesis L2 on L3 with mild-to-moderate disc space narrowing L1-2, L2-3 and L5-S1. Electronically Signed   By: Ashley Royalty M.D.   On: 12/04/2016 22:27   Dg Sacrum/coccyx  Result Date: 12/04/2016 CLINICAL DATA:  Sacral pain after fall EXAM: SACRUM AND COCCYX - 2+ VIEW COMPARISON:  CT 11/25/2015 FINDINGS: There is no evidence of fracture or other focal bone lesions. Partially visualized  posterior lumbar fusion hardware extending to S1. Intact hardware. Sacroiliac joints are maintained bilaterally. No diastases the pubic symphysis. Arcuate lines of the sacrum appear intact. No presacral edema or displacement of overlying bowel. IMPRESSION: No acute sacral or coccygeal fracture. Electronically Signed   By: Ashley Royalty M.D.   On: 12/04/2016 22:28    Procedures Procedures (including critical care time)  Medications Ordered in ED Medications  lidocaine (LIDODERM) 5 % 1 patch (1 patch Transdermal Patch Applied 12/04/16 2318)  ketorolac (TORADOL) 30 MG/ML injection 60 mg (60 mg Intramuscular Given 12/04/16 2255)     Initial Impression / Assessment and Plan / ED Course  I have reviewed the triage vital signs and the nursing notes.  Pertinent labs & imaging results that were available during my care of the patient were reviewed by me and considered in my medical decision making (see chart for details).     Patient with back pain secondary to a fall. She is neurovascularly intact.  No loss of bowel or bladder control. No concern for cauda equina. Xrays without evidence of acute traumatic injury. Will manage with RICE protocol and pain medicine.Return precautions discussed and provided. Patient discharged in stable condition with no unaddressed concerns.   Final Clinical Impressions(s) / ED Diagnoses   Final diagnoses:  Pain  Acute midline low back pain without sciatica    New Prescriptions Discharge Medication List as of 12/04/2016 11:07 PM    START taking these medications   Details  lidocaine (LIDODERM) 5 % Place 1 patch onto the skin daily. Remove & Discard patch within 12 hours or as directed by MD, Starting Fri 12/04/2016, Print       I personally performed the services described in this documentation, which was scribed in my presence. The recorded information has been reviewed and is accurate.       Antonietta Breach, PA-C 12/05/16 [id number]    Malvin Johns, MD 12/05/16 [id number]

## 2016-12-14 ENCOUNTER — Emergency Department (HOSPITAL_COMMUNITY)
Admission: EM | Admit: 2016-12-14 | Discharge: 2016-12-14 | Disposition: A | Discharge status: Home / Self Care | Payer: No Typology Code available for payment source | Attending: Emergency Medicine | Admitting: Emergency Medicine

## 2016-12-14 ENCOUNTER — Encounter (HOSPITAL_COMMUNITY): Payer: Self-pay | Admitting: Emergency Medicine

## 2016-12-14 DIAGNOSIS — F17210 Nicotine dependence, cigarettes, uncomplicated: Secondary | ICD-10-CM | POA: Insufficient documentation

## 2016-12-14 DIAGNOSIS — F909 Attention-deficit hyperactivity disorder, unspecified type: Secondary | ICD-10-CM | POA: Insufficient documentation

## 2016-12-14 DIAGNOSIS — Z79899 Other long term (current) drug therapy: Secondary | ICD-10-CM | POA: Insufficient documentation

## 2016-12-14 DIAGNOSIS — K0889 Other specified disorders of teeth and supporting structures: Secondary | ICD-10-CM | POA: Insufficient documentation

## 2016-12-14 MED ORDER — PENICILLIN V POTASSIUM 500 MG PO TABS: 500.0000 mg | ORAL_TABLET | Freq: Four times a day (QID) | ORAL | 0 refills | Status: DC

## 2016-12-14 MED ORDER — NAPROXEN 500 MG PO TABS: 500.0000 mg | ORAL_TABLET | Freq: Two times a day (BID) | ORAL | 0 refills | Status: DC

## 2016-12-14 MED ORDER — OXYCODONE-ACETAMINOPHEN 5-325 MG PO TABS
2.0000 | ORAL_TABLET | Freq: Once | ORAL | Status: AC
  Administered 2016-12-14: 2 via ORAL
  Filled 2016-12-14: qty 2

## 2016-12-14 NOTE — ED Triage Notes (Addendum)
Pt c/o upper left dental pain that began this AM; pt stated she drank cold water with dinner that irritated her tooth and now she is in severe pain; tried using naproxen and gargled with warm salt water without relief

## 2016-12-14 NOTE — ED Provider Notes (Signed)
[facility] DEPT Provider Note   CSN: [id number] Arrival date & time: 12/14/16  0019   By signing my name below, I, Macon Large, attest that this documentation has been prepared under the direction and in the presence of Waynetta Pean, [place]. Electronically Signed: Macon Large, ED Scribe. 12/14/16. 2:03 AM.  History   Chief Complaint Chief Complaint  Patient presents with  . Dental Pain   HPI  HPI Comments: [name] is a 51 y.o. female who presents to the Emergency Department for upper left dental pain onset yesterday evening. Pt notes she was drinking ice cold lemonade at dinner when she began to experience sensitivity to her upper left molars. Pain described as severe, worse with chewing on affected side.  No fevers, sweats, or chills. Pt reports taking naproxen at home without significant relief. Pt is not currently established with a dentist. She denies facial swelling, pus from mouth and sore throat.  Past Medical History:  Diagnosis Date  . ADD (attention deficit disorder with hyperactivity)   . Bilateral ovarian cysts   . Chlamydia   . Depression   . PID (acute pelvic inflammatory disease)   . PTSD (post-traumatic stress disorder)   . Sciatica     Patient Active Problem List   Diagnosis Date Noted  . Chronic back pain 12/06/2013  . PTSD (post-traumatic stress disorder) 12/06/2013  . Other screening mammogram 12/06/2013  . ADD (attention deficit disorder) 12/06/2013    Past Surgical History:  Procedure Laterality Date  . ABDOMINAL HYSTERECTOMY    . BACK SURGERY     dbl lumbar fusion L5-S1  . BLADDER SURGERY    . LAPAROSCOPY    . ROTATOR CUFF REPAIR Right 02/15/2013    OB History    No data available       Home Medications    Prior to Admission medications   Medication Sig Start Date End Date Taking? Authorizing Provider  acetaminophen (TYLENOL) 325 MG tablet Take 650 mg by mouth every 6 (six) hours as needed for moderate pain or  headache.    Historical Provider, MD  ALPRAZolam Duanne Moron) 1 MG tablet Take 1 tablet (1 mg total) by mouth 3 (three) times daily as needed. anxiety 04/11/13   Niel Hummer, NP  amphetamine-dextroamphetamine (ADDERALL) 20 MG tablet Take 20 mg by mouth 3 (three) times daily as needed (for anxiety).    Historical Provider, MD  HYDROcodone-acetaminophen (NORCO) 5-325 MG tablet Take 1 tablet by mouth every 6 (six) hours as needed for severe pain. 07/28/16   Mercedes Street, PA-C  ibuprofen (ADVIL,MOTRIN) 600 MG tablet Take 1 tablet (600 mg total) by mouth every 6 (six) hours as needed. 12/04/16   Antonietta Breach, PA-C  lidocaine (LIDODERM) 5 % Place 1 patch onto the skin daily. Remove & Discard patch within 12 hours or as directed by MD 12/04/16   Antonietta Breach, PA-C  methocarbamol (ROBAXIN) 500 MG tablet Take 1 tablet (500 mg total) by mouth 2 (two) times daily. 12/04/16   Antonietta Breach, PA-C  naproxen (NAPROSYN) 500 MG tablet Take 1 tablet (500 mg total) by mouth 2 (two) times daily with a meal. 12/14/16   Waynetta Pean, PA-C  ondansetron (ZOFRAN ODT) 4 MG disintegrating tablet Take 1 tablet (4 mg total) by mouth every 8 (eight) hours as needed for nausea or vomiting. 07/28/16   Mercedes Street, PA-C  penicillin v potassium (VEETID) 500 MG tablet Take 1 tablet (500 mg total) by mouth 4 (four) times daily. 12/14/16  Waynetta Pean, PA-C  sertraline (ZOLOFT) 100 MG tablet Take 2 tablets (200 mg total) by mouth daily. 04/11/13   Niel Hummer, NP    Family History Family History  Problem Relation Age of Onset  . Hypertension Mother   . Heart failure Mother   . Hypertension Father   . Heart failure Father   . Hypertension Sister     Social History Social History  Substance Use Topics  . Smoking status: Current Every Day Smoker    Packs/day: 0.25    Years: 14.00    Types: Cigarettes  . Smokeless tobacco: Current User  . Alcohol use No     Allergies   Imitrex [sumatriptan base]; Clarithromycin;  Clindamycin/lincomycin; Morphine and related; and Vicodin [hydrocodone-acetaminophen]   Review of Systems Review of Systems  Constitutional: Negative for chills and fever.  HENT: Positive for dental problem (upper left molars). Negative for drooling, ear pain, facial swelling, sore throat and trouble swallowing.   Eyes: Negative for pain and visual disturbance.  Musculoskeletal: Negative for neck pain and neck stiffness.  Skin: Negative for rash.  Neurological: Negative for headaches.     Physical Exam Updated Vital Signs BP 137/90 (BP Location: Left Arm)   Pulse 70   Temp 98.2 F (36.8 C) (Oral)   Resp 19   Ht 5' 6.5" (1.689 m)   Wt 267 lb (121.1 kg)   SpO2 100%   BMI 42.45 kg/m   Physical Exam  Constitutional: She appears well-developed and well-nourished. No distress.  Non-toxic appearing.   HENT:  Head: Normocephalic and atraumatic.  Right Ear: External ear normal.  Left Ear: External ear normal.  Mouth/Throat: Oropharynx is clear and moist. No oropharyngeal exudate.  TMs normal bilaterally. Tenderness to left upper molars. Poor dentition. No discharge from the mouth. No facial swelling. Uvula is midline without edema. Soft palate rises symmetrically. No tonsillar hypertrophy or exudates. Tongue protrusion is normal. No trismus. No PTA.  Bilateral tympanic membranes are pearly-gray without erythema or loss of landmarks.   Eyes: Conjunctivae are normal. Pupils are equal, round, and reactive to light. Right eye exhibits no discharge. Left eye exhibits no discharge.  Neck: Normal range of motion. Neck supple. No JVD present. No tracheal deviation present.  Cardiovascular: Normal rate and intact distal pulses.   Pulmonary/Chest: Effort normal. No stridor. No respiratory distress.  Abdominal: Soft. Bowel sounds are normal. There is no tenderness.  Lymphadenopathy:    She has no cervical adenopathy.  Neurological: Coordination normal.  Skin: Skin is warm and dry. No rash  noted. She is not diaphoretic.  Psychiatric: She has a normal mood and affect. Her behavior is normal.  Nursing note and vitals reviewed.    ED Treatments / Results   DIAGNOSTIC STUDIES: Oxygen Saturation is 100% on RA, normal by my interpretation.    COORDINATION OF CARE: 1:56 AM Discussed treatment plan with pt at bedside which includes pain medication and pt agreed to plan.   Labs (all labs ordered are listed, but only abnormal results are displayed) Labs Reviewed - No data to display  EKG  EKG Interpretation None       Radiology No results found.  Procedures Procedures (including critical care time)  Medications Ordered in ED Medications  oxyCODONE-acetaminophen (PERCOCET/ROXICET) 5-325 MG per tablet 2 tablet (not administered)     Initial Impression / Assessment and Plan / ED Course  I have reviewed the triage vital signs and the nursing notes.  Pertinent labs & imaging results that  were available during my care of the patient were reviewed by me and considered in my medical decision making (see chart for details).     This  is a 51 y.o. female who presents to the Emergency Department for upper left dental pain onset yesterday evening. On exam the patient is afebrile and non-toxic appearing. Patient with dentalgia.  No abscess requiring immediate incision and drainage.  Exam not concerning for Ludwig's angina or pharyngeal abscess.  Will treat with PCN and naproxen. Pt instructed to follow-up with dentist. Dental resource guide provided. I advised the patient to follow-up with their primary care provider this week. I advised the patient to return to the emergency department with new or worsening symptoms or new concerns. The patient verbalized understanding and agreement with plan.    Final Clinical Impressions(s) / ED Diagnoses   Final diagnoses:  Pain, dental    New Prescriptions New Prescriptions   NAPROXEN (NAPROSYN) 500 MG TABLET    Take 1 tablet (500  mg total) by mouth 2 (two) times daily with a meal.   PENICILLIN V POTASSIUM (VEETID) 500 MG TABLET    Take 1 tablet (500 mg total) by mouth 4 (four) times daily.    I personally performed the services described in this documentation, which was scribed in my presence. The recorded information has been reviewed and is accurate.       Waynetta Pean, PA-C 12/14/16 [id number]    Merryl Hacker, MD 12/14/16 0430

## 2017-01-12 ENCOUNTER — Emergency Department (HOSPITAL_COMMUNITY)
Admission: EM | Admit: 2017-01-12 | Discharge: 2017-01-12 | Disposition: A | Discharge status: Home / Self Care | Payer: No Typology Code available for payment source | Attending: Emergency Medicine | Admitting: Emergency Medicine

## 2017-01-12 ENCOUNTER — Encounter (HOSPITAL_COMMUNITY): Payer: Self-pay | Admitting: Family Medicine

## 2017-01-12 DIAGNOSIS — F17210 Nicotine dependence, cigarettes, uncomplicated: Secondary | ICD-10-CM | POA: Insufficient documentation

## 2017-01-12 DIAGNOSIS — F909 Attention-deficit hyperactivity disorder, unspecified type: Secondary | ICD-10-CM | POA: Insufficient documentation

## 2017-01-12 DIAGNOSIS — W260XXA Contact with knife, initial encounter: Secondary | ICD-10-CM | POA: Insufficient documentation

## 2017-01-12 DIAGNOSIS — Y939 Activity, unspecified: Secondary | ICD-10-CM | POA: Insufficient documentation

## 2017-01-12 DIAGNOSIS — S61431A Puncture wound without foreign body of right hand, initial encounter: Secondary | ICD-10-CM | POA: Insufficient documentation

## 2017-01-12 DIAGNOSIS — Y999 Unspecified external cause status: Secondary | ICD-10-CM | POA: Insufficient documentation

## 2017-01-12 DIAGNOSIS — Y929 Unspecified place or not applicable: Secondary | ICD-10-CM | POA: Insufficient documentation

## 2017-01-12 MED ORDER — CEPHALEXIN 500 MG PO CAPS: ORAL_CAPSULE | ORAL | 0 refills | Status: DC

## 2017-01-12 MED ORDER — SULFAMETHOXAZOLE-TRIMETHOPRIM 800-160 MG PO TABS: 1.0000 | ORAL_TABLET | Freq: Two times a day (BID) | ORAL | 0 refills | Status: AC

## 2017-01-12 MED ORDER — CEPHALEXIN 500 MG PO CAPS
500.0000 mg | ORAL_CAPSULE | Freq: Once | ORAL | Status: AC
  Administered 2017-01-12: 500 mg via ORAL
  Filled 2017-01-12: qty 1

## 2017-01-12 NOTE — Discharge Instructions (Signed)
Get help right away if: You develop severe swelling around your wound. Your pain suddenly increases and is severe. You develop painful skin lumps. You have a red streak going away from your wound. The wound is on your hand or foot and you cannot properly move a finger or toe. The wound is on your hand or foot and you notice that your fingers or toes look pale or bluish.

## 2017-01-12 NOTE — ED Notes (Signed)
Wound irrigated.

## 2017-01-12 NOTE — ED Provider Notes (Signed)
[facility] DEPT Provider Note   CSN: [id number] Arrival date & time: 01/12/17  0135     History   Chief Complaint Chief Complaint  Patient presents with  . Extremity Laceration    HPI [name] is a 51 y.o. female.He presents emergency Department with chief complaint of puncture wound. Patient states that she was trying to open a metal blue tube to appear a nail when it slipped and she punctured the webbing in between her right first and second finger. She states that it went into the webbing about a half inch. She denies any numbness or tingling. She has full range of motion. She is up-to-date on her tetanus vaccination.  HPI  Past Medical History:  Diagnosis Date  . ADD (attention deficit disorder with hyperactivity)   . Bilateral ovarian cysts   . Chlamydia   . Depression   . PID (acute pelvic inflammatory disease)   . PTSD (post-traumatic stress disorder)   . Sciatica     Patient Active Problem List   Diagnosis Date Noted  . Chronic back pain 12/06/2013  . PTSD (post-traumatic stress disorder) 12/06/2013  . Other screening mammogram 12/06/2013  . ADD (attention deficit disorder) 12/06/2013    Past Surgical History:  Procedure Laterality Date  . ABDOMINAL HYSTERECTOMY    . BACK SURGERY     dbl lumbar fusion L5-S1  . BLADDER SURGERY    . LAPAROSCOPY    . mesh removel    . ROTATOR CUFF REPAIR Right 02/15/2013    OB History    No data available       Home Medications    Prior to Admission medications   Medication Sig Start Date End Date Taking? Authorizing Provider  acetaminophen (TYLENOL) 325 MG tablet Take 650 mg by mouth every 6 (six) hours as needed for moderate pain or headache.    Historical Provider, MD  ALPRAZolam Duanne Moron) 1 MG tablet Take 1 tablet (1 mg total) by mouth 3 (three) times daily as needed. anxiety 04/11/13   Niel Hummer, NP  amphetamine-dextroamphetamine (ADDERALL) 20 MG tablet Take 20 mg by mouth 3 (three) times daily as  needed (for anxiety).    Historical Provider, MD  HYDROcodone-acetaminophen (NORCO) 5-325 MG tablet Take 1 tablet by mouth every 6 (six) hours as needed for severe pain. 07/28/16   Mercedes Street, PA-C  ibuprofen (ADVIL,MOTRIN) 600 MG tablet Take 1 tablet (600 mg total) by mouth every 6 (six) hours as needed. 12/04/16   Antonietta Breach, PA-C  lidocaine (LIDODERM) 5 % Place 1 patch onto the skin daily. Remove & Discard patch within 12 hours or as directed by MD 12/04/16   Antonietta Breach, PA-C  methocarbamol (ROBAXIN) 500 MG tablet Take 1 tablet (500 mg total) by mouth 2 (two) times daily. 12/04/16   Antonietta Breach, PA-C  naproxen (NAPROSYN) 500 MG tablet Take 1 tablet (500 mg total) by mouth 2 (two) times daily with a meal. 12/14/16   Waynetta Pean, PA-C  ondansetron (ZOFRAN ODT) 4 MG disintegrating tablet Take 1 tablet (4 mg total) by mouth every 8 (eight) hours as needed for nausea or vomiting. 07/28/16   Mercedes Street, PA-C  penicillin v potassium (VEETID) 500 MG tablet Take 1 tablet (500 mg total) by mouth 4 (four) times daily. 12/14/16   Waynetta Pean, PA-C  sertraline (ZOLOFT) 100 MG tablet Take 2 tablets (200 mg total) by mouth daily. 04/11/13   Niel Hummer, NP    Family History Family History  Problem  Relation Age of Onset  . Hypertension Mother   . Heart failure Mother   . Hypertension Father   . Heart failure Father   . Hypertension Sister     Social History Social History  Substance Use Topics  . Smoking status: Current Every Day Smoker    Packs/day: 0.25    Years: 14.00    Types: Cigarettes  . Smokeless tobacco: Current User  . Alcohol use No     Allergies   Imitrex [sumatriptan base]; Clarithromycin; Clindamycin/lincomycin; Morphine and related; and Vicodin [hydrocodone-acetaminophen]   Review of Systems Review of Systems  Ten systems reviewed and are negative for acute change, except as noted in the HPI.   Physical Exam Updated Vital Signs BP (!) 147/87 (BP Location: Right  Arm)   Pulse 87   Temp 98.2 F (36.8 C) (Oral)   Resp 18   Ht 5' 6.5" (1.689 m)   Wt 121.1 kg   SpO2 99%   BMI 42.45 kg/m   Physical Exam  Constitutional: She is oriented to person, place, and time. She appears well-developed and well-nourished. No distress.  HENT:  Head: Normocephalic and atraumatic.  Eyes: Conjunctivae are normal. No scleral icterus.  Neck: Normal range of motion.  Cardiovascular: Normal rate, regular rhythm and normal heart sounds.  Exam reveals no gallop and no friction rub.   No murmur heard. Pulmonary/Chest: Effort normal and breath sounds normal. No respiratory distress.  Abdominal: Soft. Bowel sounds are normal. She exhibits no distension and no mass. There is no tenderness. There is no guarding.  Musculoskeletal: Normal range of motion.   1 cm puncture wound to the webbing between the first second finger of the right hand. No foreign bodies visualized. Full range of motion of the thumb and index finger. Normal grip strength. Cap refill is less than 2 seconds. And sensation intact.  Neurological: She is alert and oriented to person, place, and time.  Skin: Skin is warm and dry. She is not diaphoretic.  Psychiatric: Her behavior is normal.  Nursing note and vitals reviewed.    ED Treatments / Results  Labs (all labs ordered are listed, but only abnormal results are displayed) Labs Reviewed - No data to display  EKG  EKG Interpretation None       Radiology No results found.  Procedures Procedures (including critical care time)  Medications Ordered in ED Medications - No data to display   Initial Impression / Assessment and Plan / ED Course  I have reviewed the triage vital signs and the nursing notes.  Pertinent labs & imaging results that were available during my care of the patient were reviewed by me and considered in my medical decision making (see chart for details).     Wound cleaned and bandage applied. Patient will be  discharged with Bactrim and Keflex for prevention of infection. Patient is to return for wound recheck in 2 days. I discussed return precautions with the patient as well as supportive care and home care. Patient appears safe for discharge at this time.  Final Clinical Impressions(s) / ED Diagnoses   Final diagnoses:  Puncture wound of right hand without foreign body, initial encounter    New Prescriptions New Prescriptions   No medications on file     Margarita Mail, PA-C 01/12/17 [place], MD 01/12/17 [id number]

## 2017-01-12 NOTE — ED Triage Notes (Signed)
Patient states she was unscrewing a cap off crazy glue with a knife. "It slipped and went right into my hand". Pt has a small puncture wound between the pointer and thumb. Bleeding is controlled.

## 2017-01-12 NOTE — ED Notes (Signed)
Bed: WTR5 Expected date:  Expected time:  Means of arrival:  Comments: 

## 2017-01-30 ENCOUNTER — Encounter (HOSPITAL_COMMUNITY): Payer: Self-pay | Admitting: *Deleted

## 2017-01-30 ENCOUNTER — Emergency Department (HOSPITAL_COMMUNITY)
Admission: EM | Admit: 2017-01-30 | Discharge: 2017-01-31 | Disposition: A | Discharge status: Home / Self Care | Payer: No Typology Code available for payment source | Attending: Physician Assistant | Admitting: Physician Assistant

## 2017-01-30 DIAGNOSIS — F909 Attention-deficit hyperactivity disorder, unspecified type: Secondary | ICD-10-CM | POA: Insufficient documentation

## 2017-01-30 DIAGNOSIS — F17210 Nicotine dependence, cigarettes, uncomplicated: Secondary | ICD-10-CM | POA: Insufficient documentation

## 2017-01-30 DIAGNOSIS — M546 Pain in thoracic spine: Secondary | ICD-10-CM | POA: Insufficient documentation

## 2017-01-30 DIAGNOSIS — Z79899 Other long term (current) drug therapy: Secondary | ICD-10-CM | POA: Insufficient documentation

## 2017-01-30 LAB — URINALYSIS, ROUTINE W REFLEX MICROSCOPIC
Glucose, UA: NEGATIVE mg/dL
Ketones, ur: 5 mg/dL — AB
Nitrite: NEGATIVE
PH: 8 (ref 5.0–8.0)
PROTEIN: NEGATIVE mg/dL
Specific Gravity, Urine: 1.017 (ref 1.005–1.030)

## 2017-01-30 MED ORDER — DIAZEPAM 5 MG PO TABS
5.0000 mg | ORAL_TABLET | Freq: Once | ORAL | Status: AC
  Administered 2017-01-30: 5 mg via ORAL
  Filled 2017-01-30: qty 1

## 2017-01-30 MED ORDER — KETOROLAC TROMETHAMINE 30 MG/ML IJ SOLN
60.0000 mg | Freq: Once | INTRAMUSCULAR | Status: AC
  Administered 2017-01-30: 60 mg via INTRAMUSCULAR
  Filled 2017-01-30: qty 2

## 2017-01-30 MED ORDER — TRAMADOL HCL 50 MG PO TABS
100.0000 mg | ORAL_TABLET | Freq: Once | ORAL | Status: AC
  Administered 2017-01-30: 100 mg via ORAL
  Filled 2017-01-30: qty 2

## 2017-01-30 MED ORDER — LIDOCAINE 5 % EX PTCH
1.0000 | MEDICATED_PATCH | CUTANEOUS | Status: DC
  Administered 2017-01-30: 1 via TRANSDERMAL
  Filled 2017-01-30: qty 1

## 2017-01-30 NOTE — ED Provider Notes (Signed)
[facility] DEPT Provider Note   CSN: [id number] Arrival date & time: 01/30/17  1929    History   Chief Complaint Chief Complaint  Patient presents with  . Flank Pain    HPI [name] is a 51 y.o. female.  51 year old female with a history of depression, PID, PTSD, and sciatica presents to the emergency department for evaluation of left mid back pain. She reports that pain began yesterday but has been worsening over the past few hours. She states the pain is radiating under her bilateral breasts. Pain is waxing and waning in severity. She attributes her pain to a muscle spasm. She does report some increased discomfort with breathing, but denies shortness of breath. She has taken ibuprofen and Robaxin prior to arrival without relief. She has also used over-the-counter topical creams with little improvement. She has not had any urinary symptoms, fever, nausea, vomiting, or diarrhea. No bowel or bladder incontinence. No numbness or weakness in bilateral extremities. 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 She states that she is currently on Suboxone, but did not take her Suboxone strip tonight. No hx of cancer.   The history is provided by the patient. No language interpreter was used.    Past Medical History:  Diagnosis Date  . ADD (attention deficit disorder with hyperactivity)   . Bilateral ovarian cysts   . Chlamydia   . Depression   . PID (acute pelvic inflammatory disease)   . PTSD (post-traumatic stress disorder)   . Sciatica     Patient Active Problem List   Diagnosis Date Noted  . Chronic back pain 12/06/2013  . PTSD (post-traumatic stress disorder) 12/06/2013  . Other screening mammogram 12/06/2013  . ADD (attention deficit disorder) 12/06/2013    Past Surgical History:  Procedure Laterality Date  . ABDOMINAL HYSTERECTOMY    . BACK SURGERY     dbl lumbar fusion L5-S1  . BLADDER SURGERY    . LAPAROSCOPY    . mesh removel    . ROTATOR CUFF REPAIR  Right 02/15/2013    OB History    No data available       Home Medications    Prior to Admission medications   Medication Sig Start Date End Date Taking? Authorizing Provider  acetaminophen (TYLENOL) 325 MG tablet Take 650 mg by mouth every 6 (six) hours as needed for moderate pain or headache.   Yes Historical Provider, MD  ALPRAZolam Duanne Moron) 1 MG tablet Take 1 tablet (1 mg total) by mouth 3 (three) times daily as needed. anxiety 04/11/13  Yes Niel Hummer, NP  amphetamine-dextroamphetamine (ADDERALL) 20 MG tablet Take 20 mg by mouth 3 (three) times daily as needed (for anxiety).   Yes Historical Provider, MD  ibuprofen (ADVIL,MOTRIN) 600 MG tablet Take 1 tablet (600 mg total) by mouth every 6 (six) hours as needed. 12/04/16  Yes Antonietta Breach, PA-C  sertraline (ZOLOFT) 100 MG tablet Take 2 tablets (200 mg total) by mouth daily. 04/11/13  Yes Niel Hummer, NP  SUBOXONE 8-2 MG FILM Place 1 Film under the tongue 2 (two) times daily. 01/27/17  Yes Historical Provider, MD  lidocaine (LIDODERM) 5 % Place 1 patch onto the skin daily. Remove & Discard patch within 12 hours or as directed by MD 01/31/17   Antonietta Breach, PA-C  methocarbamol (ROBAXIN) 500 MG tablet Take 1 tablet (500 mg total) by mouth every 6 (six) hours as needed for muscle spasms. 01/31/17   Antonietta Breach, PA-C  Family History Family History  Problem Relation Age of Onset  . Hypertension Mother   . Heart failure Mother   . Hypertension Father   . Heart failure Father   . Hypertension Sister     Social History Social History  Substance Use Topics  . Smoking status: Current Every Day Smoker    Packs/day: 0.25    Years: 14.00    Types: Cigarettes  . Smokeless tobacco: Current User  . Alcohol use No     Allergies   Imitrex [sumatriptan base]; Clarithromycin; Clindamycin/lincomycin; Morphine and related; and Vicodin [hydrocodone-acetaminophen]   Review of Systems Review of Systems  Musculoskeletal: Positive for back pain.    Ten systems reviewed and are negative for acute change, except as noted in the HPI.    Physical Exam Updated Vital Signs BP (!) 105/92 (BP Location: Left Arm)   Pulse 66   Temp 98 F (36.7 C) (Oral)   Resp 18   SpO2 92%   Physical Exam  Constitutional: She is oriented to person, place, and time. She appears well-developed and well-nourished. No distress.  Nontoxic appearing and whimpering; writhing around in the bed.  HENT:  Head: Normocephalic and atraumatic.  Eyes: Conjunctivae and EOM are normal. No scleral icterus.  Neck: Normal range of motion.  Cardiovascular: Normal rate, regular rhythm and intact distal pulses.   Distal radial pulse 2+ bilaterally  Pulmonary/Chest: Effort normal. No respiratory distress.  Respirations even and unlabored. Lungs CTAB.  Musculoskeletal: Normal range of motion. She exhibits tenderness.       Thoracic back: She exhibits tenderness and pain. She exhibits no spasm.       Back:  No thoracic or midline TTP. No bony deformities, step offs, or crepitus. TTP to the left thoracic paraspinal muscles.  Neurological: She is alert and oriented to person, place, and time. She exhibits normal muscle tone. Coordination normal.  Grip strength 5/5 bilaterally. 5/5 strength against resistance in all major muscle groups of BUE. Sensation to light touch intact.  Skin: Skin is warm and dry. No rash noted. She is not diaphoretic. No erythema. No pallor.  Psychiatric: She has a normal mood and affect. Her behavior is normal.  Nursing note and vitals reviewed.    ED Treatments / Results  Labs (all labs ordered are listed, but only abnormal results are displayed) Labs Reviewed  URINALYSIS, ROUTINE W REFLEX MICROSCOPIC - Abnormal; Notable for the following:       Result Value   APPearance HAZY (*)    Hgb urine dipstick SMALL (*)    Bilirubin Urine SMALL (*)    Ketones, ur 5 (*)    Leukocytes, UA TRACE (*)    Bacteria, UA MANY (*)    Squamous Epithelial /  LPF 0-5 (*)    All other components within normal limits  URINE CULTURE    EKG  EKG Interpretation None       Radiology No results found.  Procedures Procedures (including critical care time)  Medications Ordered in ED Medications  lidocaine (LIDODERM) 5 % 1 patch (1 patch Transdermal Patch Applied 01/30/17 2130)  ketorolac (TORADOL) 30 MG/ML injection 60 mg (60 mg Intramuscular Given 01/30/17 2127)  diazepam (VALIUM) tablet 5 mg (5 mg Oral Given 01/30/17 2126)  traMADol (ULTRAM) tablet 100 mg (100 mg Oral Given 01/30/17 2344)     Initial Impression / Assessment and Plan / ED Course  I have reviewed the triage vital signs and the nursing notes.  Pertinent labs & imaging  results that were available during my care of the patient were reviewed by me and considered in my medical decision making (see chart for details).     11:45 PM Patient reassessed. She appears more comfortable. She states that the heat packs on her back are starting to help a little. UA with bacteriuria but not overt signs of UTI. Culture added. No c/o dysuria or hematuria. Tramadol also ordered to supplement pain control with Lidoderm, Toradol, and Valium.  12:15 PM Patient requesting discharge. I believe this is reasonable. Suspect MSK etiology given reproducibility of symptoms and improvement with topical heating pad. No red flags or signs concerning for cauda equina or other central cord compression. No fever or midline TTP to suggest epidural abscess.  Plan to continue with supportive management. Return precautions provided at discharge. Patient agreeable to plan with no unaddressed concerns. Discharged in stable condition.   Final Clinical Impressions(s) / ED Diagnoses   Final diagnoses:  Acute left-sided thoracic back pain    New Prescriptions Discharge Medication List as of 01/31/2017 12:19 AM       Antonietta Breach, PA-C 01/31/17 0119    Courteney Lyn Mackuen, MD 02/03/17 1539

## 2017-01-30 NOTE — ED Triage Notes (Signed)
Pt complains of bilateral flank pain for the past 3 hours. Pt denies urinary symptoms. Pt states pain is 8/10. Pt denies n/v/d.

## 2017-01-31 ENCOUNTER — Emergency Department (HOSPITAL_COMMUNITY)
Admission: EM | Admit: 2017-01-31 | Discharge: 2017-02-01 | Disposition: A | Discharge status: Home / Self Care | Payer: No Typology Code available for payment source | Attending: Emergency Medicine | Admitting: Emergency Medicine

## 2017-01-31 ENCOUNTER — Encounter (HOSPITAL_COMMUNITY): Payer: Self-pay | Admitting: *Deleted

## 2017-01-31 DIAGNOSIS — F17210 Nicotine dependence, cigarettes, uncomplicated: Secondary | ICD-10-CM | POA: Insufficient documentation

## 2017-01-31 DIAGNOSIS — T401X1A Poisoning by heroin, accidental (unintentional), initial encounter: Secondary | ICD-10-CM | POA: Insufficient documentation

## 2017-01-31 DIAGNOSIS — F909 Attention-deficit hyperactivity disorder, unspecified type: Secondary | ICD-10-CM | POA: Insufficient documentation

## 2017-01-31 MED ORDER — METHOCARBAMOL 500 MG PO TABS: 500.0000 mg | ORAL_TABLET | Freq: Four times a day (QID) | ORAL | 0 refills | Status: DC | PRN

## 2017-01-31 MED ORDER — LIDOCAINE 5 % EX PTCH: 1.0000 | MEDICATED_PATCH | CUTANEOUS | 0 refills | Status: DC

## 2017-01-31 NOTE — Discharge Instructions (Signed)
Please continue your treatment regimen for your heroin abuse. If any symptoms change or worsen, please return to the nearest emergency department. Please follow-up with your PCP.

## 2017-01-31 NOTE — ED Provider Notes (Signed)
[facility] DEPT Provider Note   CSN: [id number] Arrival date & time: 01/31/17  1607     History   Chief Complaint Chief Complaint  Patient presents with  . Drug Overdose    HPI [name] is a 51 y.o. female with a past medical history significant for PTSD, depression, ADD, and chronic back pain status post back surgeries who presents with accidental Heroin overdose. Patient says that she has been having chronic back pain for a long time. She says she has had worsening back spasms and was recently seen at the emergency department last night. Patient said she was given a Lidoderm patch, Toradol, Valium, and tramadol. She said that her symptoms are better and she went home. She says that today, she continues to have the muscle spasms. She reports that she has a proliferative Robaxin she has not been able to afford however, she says that today she used heroin. She says that she does not inject heroin but instead snorts it. She said that she had a friend snorted a line of heroin today that caused her to overdose. She says she has done this for 6 months but has never overdosed. Patient was brought in by EMS after they gave 2 mg of Narcan when they found her unresponsive at a gas station. Patient is alert and oriented 3 and is having no respiratory distress on arrival. Patient says that she used a new dealer who gave her the drugs and thinks it may have been mixed with fentanyl. She says that her friend whom she snorted with also overdosed and had to go to a separate emergency department.   Patient denies any other complaints on arrival and says she is feeling much better after the Narcan. She had one episode of vomiting. She denies current nausea.     The history is provided by the patient, the EMS personnel and medical records.  Altered Mental Status   This is a new problem. The current episode started less than 1 hour ago. The problem has been resolved. Associated symptoms include  somnolence and unresponsiveness. Pertinent negatives include no confusion, no seizures, no weakness and no agitation. Risk factors include illicit drug use (heroin snorted). Her past medical history is significant for depression. Her past medical history does not include diabetes, seizures, CVA, TIA, hypertension, COPD, dementia, head trauma or heart disease.    Past Medical History:  Diagnosis Date  . ADD (attention deficit disorder with hyperactivity)   . Bilateral ovarian cysts   . Chlamydia   . Depression   . PID (acute pelvic inflammatory disease)   . PTSD (post-traumatic stress disorder)   . Sciatica     Patient Active Problem List   Diagnosis Date Noted  . Chronic back pain 12/06/2013  . PTSD (post-traumatic stress disorder) 12/06/2013  . Other screening mammogram 12/06/2013  . ADD (attention deficit disorder) 12/06/2013    Past Surgical History:  Procedure Laterality Date  . ABDOMINAL HYSTERECTOMY    . BACK SURGERY     dbl lumbar fusion L5-S1  . BLADDER SURGERY    . LAPAROSCOPY    . mesh removel    . ROTATOR CUFF REPAIR Right 02/15/2013    OB History    No data available       Home Medications    Prior to Admission medications   Medication Sig Start Date End Date Taking? Authorizing Provider  acetaminophen (TYLENOL) 325 MG tablet Take 650 mg by mouth every 6 (six) hours as needed  for moderate pain or headache.    Historical Provider, MD  ALPRAZolam Duanne Moron) 1 MG tablet Take 1 tablet (1 mg total) by mouth 3 (three) times daily as needed. anxiety 04/11/13   Niel Hummer, NP  amphetamine-dextroamphetamine (ADDERALL) 20 MG tablet Take 20 mg by mouth 3 (three) times daily as needed (for anxiety).    Historical Provider, MD  ibuprofen (ADVIL,MOTRIN) 600 MG tablet Take 1 tablet (600 mg total) by mouth every 6 (six) hours as needed. 12/04/16   Antonietta Breach, PA-C  lidocaine (LIDODERM) 5 % Place 1 patch onto the skin daily. Remove & Discard patch within 12 hours or as directed  by MD 01/31/17   Antonietta Breach, PA-C  methocarbamol (ROBAXIN) 500 MG tablet Take 1 tablet (500 mg total) by mouth every 6 (six) hours as needed for muscle spasms. 01/31/17   Antonietta Breach, PA-C  sertraline (ZOLOFT) 100 MG tablet Take 2 tablets (200 mg total) by mouth daily. 04/11/13   Niel Hummer, NP  SUBOXONE 8-2 MG FILM Place 1 Film under the tongue 2 (two) times daily. 01/27/17   Historical Provider, MD    Family History Family History  Problem Relation Age of Onset  . Hypertension Mother   . Heart failure Mother   . Hypertension Father   . Heart failure Father   . Hypertension Sister     Social History Social History  Substance Use Topics  . Smoking status: Current Every Day Smoker    Packs/day: 0.25    Years: 14.00    Types: Cigarettes  . Smokeless tobacco: Current User  . Alcohol use No     Allergies   Imitrex [sumatriptan base]; Clarithromycin; Clindamycin/lincomycin; Morphine and related; and Vicodin [hydrocodone-acetaminophen]   Review of Systems Review of Systems  Constitutional: Negative for activity change, chills, diaphoresis, fatigue and fever.  HENT: Negative for congestion and rhinorrhea.   Eyes: Negative for visual disturbance.  Respiratory: Negative for cough, chest tightness, shortness of breath and stridor.   Cardiovascular: Negative for chest pain, palpitations and leg swelling.  Gastrointestinal: Negative for abdominal distention, abdominal pain, constipation, diarrhea, nausea and vomiting.  Genitourinary: Negative for difficulty urinating, dysuria, flank pain, frequency, hematuria, menstrual problem, pelvic pain, vaginal bleeding and vaginal discharge.  Musculoskeletal: Negative for back pain and neck pain.  Skin: Negative for rash and wound.  Neurological: Negative for dizziness, seizures, syncope, weakness, light-headedness, numbness and headaches.  Psychiatric/Behavioral: Negative for agitation, confusion and suicidal ideas.  All other systems reviewed  and are negative.    Physical Exam Updated Vital Signs BP 140/70 (BP Location: Left Arm)   Pulse 77   Temp 98.5 F (36.9 C) (Oral)   Resp 18   Ht 5' 6.5" (1.689 m)   Wt 267 lb (121.1 kg)   SpO2 98%   BMI 42.45 kg/m   Physical Exam  Constitutional: She is oriented to person, place, and time. She appears well-developed and well-nourished. No distress.  HENT:  Head: Normocephalic and atraumatic.  Right Ear: External ear normal.  Left Ear: External ear normal.  Nose: Nose normal.  Mouth/Throat: Oropharynx is clear and moist. No oropharyngeal exudate.  Eyes: Conjunctivae and EOM are normal. Pupils are equal, round, and reactive to light.  Neck: Normal range of motion. Neck supple.  Cardiovascular: Normal rate, normal heart sounds and intact distal pulses.   No murmur heard. Pulmonary/Chest: Effort normal and breath sounds normal. No stridor. No respiratory distress. She has no wheezes. She has no rales. She exhibits no  tenderness.  Abdominal: She exhibits no distension. There is no tenderness. There is no rebound.  Musculoskeletal: She exhibits no tenderness.  Neurological: She is alert and oriented to person, place, and time. She has normal reflexes. She displays normal reflexes. No cranial nerve deficit or sensory deficit. She exhibits normal muscle tone. Coordination normal.  Skin: Skin is warm. Capillary refill takes less than 2 seconds. No rash noted. She is not diaphoretic. No erythema.  Psychiatric: She has a normal mood and affect.  Nursing note and vitals reviewed.    ED Treatments / Results  Labs (all labs ordered are listed, but only abnormal results are displayed) Labs Reviewed - No data to display  EKG  EKG Interpretation None       Radiology No results found.  Procedures Procedures (including critical care time)  Medications Ordered in ED Medications - No data to display   Initial Impression / Assessment and Plan / ED Course  I have reviewed the  triage vital signs and the nursing notes.  Pertinent labs & imaging results that were available during my care of the patient were reviewed by me and considered in my medical decision making (see chart for details).     [name] is a 51 y.o. female with a past medical history significant for PTSD, depression, ADD, and chronic back pain status post back surgeries who presents with accidental Heroin overdose.   History and exam are seen above.  On exam, patient has clear lungs, nontender chest, and nontender abdomen. Complete narrow exam is unremarkable including normal orientation, speech, and cranial nerves. Patient had no evidence of external, on exam.  As patient received large dose of Narcan to reverse the heroine prior to arrival, patient will need to be observed for several hours.  Based on patients report, suspect fentanyl was mixed with the heroine that she snorted leading to the overdose. Patient says that her friend had similar overdose.  Patient denies any SI or HI. She says this was an accidental overdose and she does not want to hurt herself.  Patient observed for a period of time with continued resolution of altered mental status. Patient had no further somnolence. Patient did not require further doses of Narcan. Patient was frequently re-examined with unremarkable examine no changes. Patient allowed to eat and rest.  After sufficient time past, patient was filled stable for discharge. Patient reports that she will follow up with her substance abuse team. Patient agrees with return precautions and follow instructions. Patient had no other questions or concerns and was discharged in good condition.   Final Clinical Impressions(s) / ED Diagnoses   Final diagnoses:  Accidental overdose of heroin, initial encounter    New Prescriptions Discharge Medication List as of 01/31/2017 11:38 PM      Clinical Impression: 1. Accidental overdose of heroin, initial encounter       Disposition: Discharge  Condition: Good  I have discussed the results, Dx and Tx plan with the pt(& family if present). He/she/they expressed understanding and agree(s) with the plan. Discharge instructions discussed at great length. Strict return precautions discussed and pt &/or family have verbalized understanding of the instructions. No further questions at time of discharge.    Discharge Medication List as of 01/31/2017 11:38 PM      Follow Up: Marliss Coots, NP [place] [address] [phone]     [facility] [address] [id number] [place] [phone]  If symptoms worsen     Courtney Paris, MD 02/01/17 [phone]

## 2017-01-31 NOTE — ED Notes (Signed)
Pt running to bathroom with diarrhea.  States diarrhea ever since waking up from overdose.

## 2017-01-31 NOTE — ED Notes (Signed)
Pt up beside bed, screaming obscenities into phone.  Pt now calm, requested to lay back in bed.

## 2017-01-31 NOTE — ED Notes (Signed)
Pt given sprite per MD.

## 2017-01-31 NOTE — ED Triage Notes (Signed)
Pt found beside gas pumps unsresponsive.  GCS of 3, hr 100, pt was cyanotic.  2 mg IV narcan given on scene and pt became alert.  States she overdosed on heroine.

## 2017-02-01 LAB — URINE CULTURE

## 2017-04-08 ENCOUNTER — Emergency Department (HOSPITAL_COMMUNITY): Payer: No Typology Code available for payment source

## 2017-04-08 ENCOUNTER — Encounter (HOSPITAL_COMMUNITY): Payer: Self-pay | Admitting: Emergency Medicine

## 2017-04-08 ENCOUNTER — Emergency Department (HOSPITAL_COMMUNITY)
Admission: EM | Admit: 2017-04-08 | Discharge: 2017-04-09 | Disposition: A | Discharge status: Other Acute Inpatient Hospital | Payer: No Typology Code available for payment source | Attending: Emergency Medicine | Admitting: Emergency Medicine

## 2017-04-08 DIAGNOSIS — F332 Major depressive disorder, recurrent severe without psychotic features: Secondary | ICD-10-CM | POA: Insufficient documentation

## 2017-04-08 DIAGNOSIS — F330 Major depressive disorder, recurrent, mild: Secondary | ICD-10-CM | POA: Diagnosis present

## 2017-04-08 DIAGNOSIS — F1910 Other psychoactive substance abuse, uncomplicated: Secondary | ICD-10-CM

## 2017-04-08 DIAGNOSIS — F17210 Nicotine dependence, cigarettes, uncomplicated: Secondary | ICD-10-CM | POA: Insufficient documentation

## 2017-04-08 DIAGNOSIS — R059 Cough, unspecified: Secondary | ICD-10-CM

## 2017-04-08 DIAGNOSIS — R739 Hyperglycemia, unspecified: Secondary | ICD-10-CM | POA: Insufficient documentation

## 2017-04-08 DIAGNOSIS — F1920 Other psychoactive substance dependence, uncomplicated: Secondary | ICD-10-CM | POA: Insufficient documentation

## 2017-04-08 DIAGNOSIS — E876 Hypokalemia: Secondary | ICD-10-CM | POA: Insufficient documentation

## 2017-04-08 DIAGNOSIS — F1120 Opioid dependence, uncomplicated: Secondary | ICD-10-CM

## 2017-04-08 DIAGNOSIS — Z9114 Patient's other noncompliance with medication regimen: Secondary | ICD-10-CM

## 2017-04-08 DIAGNOSIS — Z720 Tobacco use: Secondary | ICD-10-CM

## 2017-04-08 DIAGNOSIS — Z79899 Other long term (current) drug therapy: Secondary | ICD-10-CM | POA: Insufficient documentation

## 2017-04-08 DIAGNOSIS — R05 Cough: Secondary | ICD-10-CM | POA: Insufficient documentation

## 2017-04-08 DIAGNOSIS — M791 Myalgia: Secondary | ICD-10-CM | POA: Insufficient documentation

## 2017-04-08 DIAGNOSIS — J020 Streptococcal pharyngitis: Secondary | ICD-10-CM | POA: Insufficient documentation

## 2017-04-08 DIAGNOSIS — R45851 Suicidal ideations: Secondary | ICD-10-CM | POA: Insufficient documentation

## 2017-04-08 DIAGNOSIS — D729 Disorder of white blood cells, unspecified: Secondary | ICD-10-CM

## 2017-04-08 DIAGNOSIS — F4310 Post-traumatic stress disorder, unspecified: Secondary | ICD-10-CM | POA: Diagnosis present

## 2017-04-08 DIAGNOSIS — R509 Fever, unspecified: Secondary | ICD-10-CM | POA: Insufficient documentation

## 2017-04-08 DIAGNOSIS — D72829 Elevated white blood cell count, unspecified: Secondary | ICD-10-CM | POA: Insufficient documentation

## 2017-04-08 LAB — COMPREHENSIVE METABOLIC PANEL
ALT: 15 U/L (ref 14–54)
AST: 30 U/L (ref 15–41)
Albumin: 3.6 g/dL (ref 3.5–5.0)
Alkaline Phosphatase: 121 U/L (ref 38–126)
Anion gap: 9 (ref 5–15)
BUN: <5 mg/dL — ABNORMAL LOW (ref 6–20)
CO2: 29 mmol/L (ref 22–32)
Calcium: 8.5 mg/dL — ABNORMAL LOW (ref 8.9–10.3)
Chloride: 98 mmol/L — ABNORMAL LOW (ref 101–111)
Creatinine, Ser: 0.81 mg/dL (ref 0.44–1.00)
GFR calc Af Amer: >60 mL/min (ref >60)
GFR calc non Af Amer: >60 mL/min (ref >60)
Glucose, Bld: 193 mg/dL — ABNORMAL HIGH (ref 65–99)
Potassium: 2.4 mmol/L — CL (ref 3.5–5.1)
Sodium: 136 mmol/L (ref 135–145)
Total Bilirubin: 0.5 mg/dL (ref 0.3–1.2)
Total Protein: 7.3 g/dL (ref 6.5–8.1)

## 2017-04-08 LAB — MAGNESIUM: Magnesium: 1.8 mg/dL (ref 1.7–2.4)

## 2017-04-08 LAB — RAPID URINE DRUG SCREEN, HOSP PERFORMED
Amphetamines: POSITIVE — AB
Barbiturates: NOT DETECTED
Benzodiazepines: POSITIVE — AB
Cocaine: POSITIVE — AB
Opiates: POSITIVE — AB
Tetrahydrocannabinol: NOT DETECTED

## 2017-04-08 LAB — RAPID STREP SCREEN (MED CTR MEBANE ONLY): Streptococcus, Group A Screen (Direct): POSITIVE — AB

## 2017-04-08 LAB — DIFFERENTIAL
Basophils Absolute: 0.1 10*3/uL (ref 0.0–0.1)
Basophils Relative: 0 %
Eosinophils Absolute: 0 10*3/uL (ref 0.0–0.7)
Eosinophils Relative: 0 %
Lymphocytes Relative: 6 %
Lymphs Abs: 1.3 10*3/uL (ref 0.7–4.0)
Monocytes Absolute: 1.3 10*3/uL — ABNORMAL HIGH (ref 0.1–1.0)
Monocytes Relative: 6 %
Neutro Abs: 20.5 10*3/uL — ABNORMAL HIGH (ref 1.7–7.7)
Neutrophils Relative %: 88 %

## 2017-04-08 LAB — CBC
HEMATOCRIT: 40.3 % (ref 36.0–46.0)
Hemoglobin: 13.5 g/dL (ref 12.0–15.0)
MCH: 28.8 pg (ref 26.0–34.0)
MCHC: 33.5 g/dL (ref 30.0–36.0)
MCV: 86.1 fL (ref 78.0–100.0)
Platelets: 387 10*3/uL (ref 150–400)
RBC: 4.68 MIL/uL (ref 3.87–5.11)
RDW: 13.6 % (ref 11.5–15.5)
WBC: 24.8 10*3/uL — AB (ref 4.0–10.5)

## 2017-04-08 LAB — ETHANOL: Alcohol, Ethyl (B): <5 mg/dL (ref <5)

## 2017-04-08 LAB — POTASSIUM: Potassium: 3.4 mmol/L — ABNORMAL LOW (ref 3.5–5.1)

## 2017-04-08 LAB — SALICYLATE LEVEL: Salicylate Lvl: <7 mg/dL (ref 2.8–30.0)

## 2017-04-08 LAB — ACETAMINOPHEN LEVEL: Acetaminophen (Tylenol), Serum: <10 ug/mL — ABNORMAL LOW (ref 10–30)

## 2017-04-08 MED ORDER — ZOLPIDEM TARTRATE 5 MG PO TABS: 5.0000 mg | ORAL_TABLET | Freq: Every evening | ORAL | Status: DC | PRN

## 2017-04-08 MED ORDER — POTASSIUM CHLORIDE CRYS ER 20 MEQ PO TBCR: 20.0000 meq | EXTENDED_RELEASE_TABLET | Freq: Once | ORAL | Status: DC

## 2017-04-08 MED ORDER — SODIUM CHLORIDE 0.9 % IV BOLUS (SEPSIS)
1000.0000 mL | Freq: Once | INTRAVENOUS | Status: AC
  Administered 2017-04-08: 1000 mL via INTRAVENOUS

## 2017-04-08 MED ORDER — ACETAMINOPHEN 500 MG PO TABS: 1000.0000 mg | ORAL_TABLET | Freq: Once | ORAL | Status: DC

## 2017-04-08 MED ORDER — ACETAMINOPHEN 500 MG PO TABS
1000.0000 mg | ORAL_TABLET | Freq: Once | ORAL | Status: AC
  Administered 2017-04-08: 1000 mg via ORAL
  Filled 2017-04-08: qty 2

## 2017-04-08 MED ORDER — MAGIC MOUTHWASH W/LIDOCAINE
15.0000 mL | Freq: Three times a day (TID) | ORAL | Status: DC | PRN
  Administered 2017-04-08: 15 mL via ORAL
  Filled 2017-04-08: qty 15

## 2017-04-08 MED ORDER — ALUM & MAG HYDROXIDE-SIMETH 200-200-20 MG/5ML PO SUSP: 30.0000 mL | Freq: Four times a day (QID) | ORAL | Status: DC | PRN

## 2017-04-08 MED ORDER — NICOTINE 21 MG/24HR TD PT24
21.0000 mg | MEDICATED_PATCH | Freq: Every day | TRANSDERMAL | Status: DC
  Administered 2017-04-09: 21 mg via TRANSDERMAL
  Filled 2017-04-08: qty 1

## 2017-04-08 MED ORDER — ONDANSETRON HCL 4 MG/2ML IJ SOLN
4.0000 mg | Freq: Once | INTRAMUSCULAR | Status: AC
  Administered 2017-04-08: 4 mg via INTRAVENOUS
  Filled 2017-04-08: qty 2

## 2017-04-08 MED ORDER — IBUPROFEN 200 MG PO TABS
600.0000 mg | ORAL_TABLET | Freq: Three times a day (TID) | ORAL | Status: DC | PRN
  Administered 2017-04-09 (×2): 600 mg via ORAL
  Filled 2017-04-08 (×2): qty 3

## 2017-04-08 MED ORDER — SERTRALINE HCL 50 MG PO TABS
200.0000 mg | ORAL_TABLET | Freq: Every day | ORAL | Status: DC
  Administered 2017-04-09: 200 mg via ORAL
  Filled 2017-04-08: qty 4

## 2017-04-08 MED ORDER — POTASSIUM CHLORIDE CRYS ER 20 MEQ PO TBCR
80.0000 meq | EXTENDED_RELEASE_TABLET | Freq: Once | ORAL | Status: AC
  Administered 2017-04-08: 80 meq via ORAL
  Filled 2017-04-08: qty 4

## 2017-04-08 MED ORDER — ACETAMINOPHEN 325 MG PO TABS: 650.0000 mg | ORAL_TABLET | ORAL | Status: DC | PRN

## 2017-04-08 MED ORDER — PHENOL 1.4 % MT LIQD
2.0000 | OROMUCOSAL | Status: DC | PRN
  Filled 2017-04-08: qty 177

## 2017-04-08 MED ORDER — PENICILLIN G BENZATHINE 1200000 UNIT/2ML IM SUSP
1.2000 10*6.[IU] | Freq: Once | INTRAMUSCULAR | Status: AC
  Administered 2017-04-08: 1.2 10*6.[IU] via INTRAMUSCULAR
  Filled 2017-04-08: qty 2

## 2017-04-08 MED ORDER — PROMETHAZINE HCL 25 MG PO TABS
25.0000 mg | ORAL_TABLET | Freq: Four times a day (QID) | ORAL | Status: DC | PRN
Start: 2017-04-08 — End: 2017-04-09

## 2017-04-08 MED ORDER — DM-GUAIFENESIN ER 30-600 MG PO TB12
1.0000 | ORAL_TABLET | Freq: Two times a day (BID) | ORAL | Status: DC | PRN
  Filled 2017-04-08 (×2): qty 1

## 2017-04-08 NOTE — ED Notes (Signed)
Dr Winfred Leeds notified of K+=2.4 states will be over to see pt.

## 2017-04-08 NOTE — ED Triage Notes (Signed)
Patient who became homeless two weeks ago reports suicidal ideation with plan to cut herself along with a sore throat and body aches that started two days ago.  Denies nausea or vomiting.  Patient alert and oriented, calm and cooperative.
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
## 2017-04-08 NOTE — ED Provider Notes (Signed)
Complains of sore throat for the past 3 days as well as feeling suicidal. She's run out of her Zoloft several days ago. Method of suicide would be to cut herself. She denies fever. Sore throat is worse with swallowing. On exam she is alert and nontoxic. HEENT exam oropharynx is reddened. Uvula midline. She is nontoxic appearing neurologic Glasgow Coma Score 15 cranial nerves II through XII intact. ED ECG REPORT   Date: 04/08/2017  Rate: 75  Rhythm: normal sinus rhythm  QRS Axis: left  Intervals: QT prolonged  ST/T Wave abnormalities: nonspecific T wave changes  Conduction Disutrbances:Rightward IV CD  Narrative Interpretation:   Old EKG Reviewed: none available  I have personally reviewed the EKG tracing and agree with the computerized printout as noted.   Orlie Dakin, MD 04/08/17 [phone]

## 2017-04-08 NOTE — Progress Notes (Signed)
Per ED notes, patient reported feeling suicidal with a plan to cut herself. Per TTS, patient denies SI, but reports that she is unable to contract for safety. Recommend evaluation in AM.

## 2017-04-08 NOTE — ED Provider Notes (Signed)
[facility] DEPT Provider Note   CSN: [id number] Arrival date & time: 04/08/17  1723     History   Chief Complaint Chief Complaint  Patient presents with  . suicidal, sore throat, body aches    HPI [name] is a 51 y.o. female with a PMHx of chronic back pain, sciatica, PTSD, depression, ADD, and PSHx of hysterectomy, who presents to the ED with complaints of suicidal ideations with a plan to cut her wrists onset 1 week ago. Patient recently became homeless and this has increased the stress in her life. She has not been on any of her medications for the last 3 weeks, the only medication she remembers the name of is Zoloft, she can't recall any of the other drug names she's supposed to be on. 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 Last time she used was about 3-4 days ago, and she states that it was "very little" because she's "really trying to quit". She also admits to being a daily cigarette smoker. She denies HI, AVH, or alcohol use. Secondarily she reports that she feels like she has either strep throat or the flu. She reports that she's had a sore throat for 2 days, with associated subjective fevers, chills, body aches, and cough with green sputum production. No known aggravating factors, symptoms mildly improved with TheraFlu and cough drops. She denies known history of HIV or diabetes, no known hx of immunocompromising condition. She is here voluntarily.   She denies rhinorrhea, ear pain/drainage, drooling, trismus, CP, SOB, wheezing, abd pain, N/V/D/C, hematuria, dysuria, arthralgias, numbness, tingling, focal weakness, HI, AVH, or any other complaints at this time.    The history is provided by the patient and medical records. No language interpreter was used.  Mental Health Problem  Presenting symptoms: depression and suicidal thoughts   Presenting symptoms: no hallucinations   Onset quality:   Gradual Duration:  1 week Timing:  Constant Progression:  Worsening Chronicity:  Recurrent Context: stressful life event   Treatment compliance:  Untreated Time since last psychoactive medication taken:  3 weeks Relieved by:  None tried Worsened by:  Nothing Ineffective treatments:  None tried Associated symptoms: no abdominal pain and no chest pain   Risk factors: hx of mental illness and recent psychiatric admission     Past Medical History:  Diagnosis Date  . ADD (attention deficit disorder with hyperactivity)   . Bilateral ovarian cysts   . Chlamydia   . Depression   . PID (acute pelvic inflammatory disease)   . PTSD (post-traumatic stress disorder)   . Sciatica     Patient Active Problem List   Diagnosis Date Noted  . Chronic back pain 12/06/2013  . PTSD (post-traumatic stress disorder) 12/06/2013  . Other screening mammogram 12/06/2013  . ADD (attention deficit disorder) 12/06/2013    Past Surgical History:  Procedure Laterality Date  . ABDOMINAL HYSTERECTOMY    . BACK SURGERY     dbl lumbar fusion L5-S1  . BLADDER SURGERY    . LAPAROSCOPY    . mesh removel    . ROTATOR CUFF REPAIR Right 02/15/2013    OB History    No data available       Home Medications    Prior to Admission medications   Medication Sig Start Date End Date Taking? Authorizing Provider  acetaminophen (TYLENOL) 325 MG tablet Take 650 mg by mouth every 6 (six) hours as needed  for moderate pain or headache.    [provider]  ALPRAZolam Duanne Moron) 1 MG tablet Take 1 tablet (1 mg total) by mouth 3 (three) times daily as needed. anxiety 04/11/13   Niel Hummer, NP  amphetamine-dextroamphetamine (ADDERALL) 20 MG tablet Take 20 mg by mouth 3 (three) times daily as needed (for anxiety).    [provider]  ibuprofen (ADVIL,MOTRIN) 600 MG tablet Take 1 tablet (600 mg total) by mouth every 6 (six) hours as needed. 12/04/16   Antonietta Breach, PA-C  lidocaine (LIDODERM) 5 % Place 1  patch onto the skin daily. Remove & Discard patch within 12 hours or as directed by MD 01/31/17   Antonietta Breach, PA-C  methocarbamol (ROBAXIN) 500 MG tablet Take 1 tablet (500 mg total) by mouth every 6 (six) hours as needed for muscle spasms. 01/31/17   Antonietta Breach, PA-C  sertraline (ZOLOFT) 100 MG tablet Take 2 tablets (200 mg total) by mouth daily. 04/11/13   Niel Hummer, NP  SUBOXONE 8-2 MG FILM Place 1 Film under the tongue 2 (two) times daily. 01/27/17   [provider]    Family History Family History  Problem Relation Age of Onset  . Hypertension Mother   . Heart failure Mother   . Hypertension Father   . Heart failure Father   . Hypertension Sister     Social History Social History  Substance Use Topics  . Smoking status: Current Every Day Smoker    Packs/day: 0.25    Years: 14.00    Types: Cigarettes  . Smokeless tobacco: Current User  . Alcohol use No     Allergies   Imitrex [sumatriptan base]; Clarithromycin; Clindamycin/lincomycin; Morphine and related; and Vicodin [hydrocodone-acetaminophen]   Review of Systems Review of Systems  Constitutional: Positive for chills and fever (subjective).  HENT: Positive for sore throat. Negative for drooling, ear discharge, ear pain, rhinorrhea and trouble swallowing.   Respiratory: Positive for cough. Negative for shortness of breath and wheezing.   Cardiovascular: Negative for chest pain.  Gastrointestinal: Negative for abdominal pain, constipation, diarrhea, nausea and vomiting.  Genitourinary: Negative for dysuria and hematuria.  Musculoskeletal: Positive for myalgias (body aches). Negative for arthralgias.  Skin: Negative for color change.  Allergic/Immunologic: Negative for immunocompromised state.  Neurological: Negative for weakness and numbness.  Psychiatric/Behavioral: Positive for suicidal ideas. Negative for confusion and hallucinations.   All other systems reviewed and are negative for acute change except  as noted in the HPI.    Physical Exam Updated Vital Signs BP (!) 141/72 (BP Location: Left Arm)   Pulse 86   Temp 99.1 F (37.3 C) (Oral)   Resp 20   SpO2 96%   Physical Exam  Constitutional: She is oriented to person, place, and time. Vital signs are normal. She appears well-developed and well-nourished.  Non-toxic appearance. No distress.  Low-grade temp 99.1, nontoxic, NAD  HENT:  Head: Normocephalic and atraumatic.  Nose: Nose normal.  Mouth/Throat: Uvula is midline. Mucous membranes are dry (mildly). No trismus in the jaw. No uvula swelling. Oropharyngeal exudate, posterior oropharyngeal edema and posterior oropharyngeal erythema present. No tonsillar abscesses. Tonsils are 3+ on the right. Tonsils are 3+ on the left. Tonsillar exudate.  Nose clear. Oropharynx coated with white exudates, very erythematous, without uvular swelling or deviation, no trismus or drooling, with 3+ b/l tonsillar swelling and erythema, +exudates.  No evidence of ludwig's or PTA.  Eyes: Conjunctivae and EOM are normal. Right eye exhibits no discharge. Left eye exhibits  no discharge.  Neck: Normal range of motion. Neck supple.  Cardiovascular: Normal rate, regular rhythm, normal heart sounds and intact distal pulses.  Exam reveals no gallop and no friction rub.   No murmur heard. Pulmonary/Chest: Effort normal and breath sounds normal. No respiratory distress. She has no decreased breath sounds. She has no wheezes. She has no rhonchi. She has no rales.  CTAB in all lung fields, no w/r/r, no hypoxia or increased WOB, speaking in full sentences, SpO2 96% on RA   Abdominal: Soft. Normal appearance and bowel sounds are normal. She exhibits no distension. There is no tenderness. There is no rigidity, no rebound, no guarding, no CVA tenderness, no tenderness at McBurney's point and negative Murphy's sign.  Musculoskeletal: Normal range of motion.  Lymphadenopathy:       Head (right side): Tonsillar adenopathy  present.       Head (left side): Tonsillar adenopathy present.  B/l tonsillar LAD which is moderately TTP  Neurological: She is alert and oriented to person, place, and time. She has normal strength. No sensory deficit.  Skin: Skin is warm, dry and intact. No rash noted.  Psychiatric: She is not actively hallucinating. She exhibits a depressed mood. She expresses suicidal ideation. She expresses no homicidal ideation. She expresses suicidal plans. She expresses no homicidal plans.  Depressed affect, but pleasant and cooperative. Endorsing SI with a plan, denies HI or AVH, doesn't seem to be responding to internal stimuli.   Nursing note and vitals reviewed.    ED Treatments / Results  Labs (all labs ordered are listed, but only abnormal results are displayed) Labs Reviewed  RAPID STREP SCREEN (NOT AT [facility]) - Abnormal; Notable for the following:       Result Value   Streptococcus, Group A Screen (Direct) POSITIVE (*)    All other components within normal limits  COMPREHENSIVE METABOLIC PANEL - Abnormal; Notable for the following:    Potassium 2.4 (*)    Chloride 98 (*)    Glucose, Bld 193 (*)    BUN <5 (*)    Calcium 8.5 (*)    All other components within normal limits  ACETAMINOPHEN LEVEL - Abnormal; Notable for the following:    Acetaminophen (Tylenol), Serum <10 (*)    All other components within normal limits  CBC - Abnormal; Notable for the following:    WBC 24.8 (*)    All other components within normal limits  RAPID URINE DRUG SCREEN, HOSP PERFORMED - Abnormal; Notable for the following:    Opiates POSITIVE (*)    Cocaine POSITIVE (*)    Benzodiazepines POSITIVE (*)    Amphetamines POSITIVE (*)    All other components within normal limits  DIFFERENTIAL - Abnormal; Notable for the following:    Neutro Abs 20.5 (*)    Monocytes Absolute 1.3 (*)    All other components within normal limits  POTASSIUM - Abnormal; Notable for the following:    Potassium 3.4 (*)    All  other components within normal limits  ETHANOL  SALICYLATE LEVEL  MAGNESIUM    EKG  EKG Interpretation None       Radiology Dg Chest 2 View  Result Date: 04/08/2017 CLINICAL DATA:  Pharyngitis, fever, chills, cough and myalgias for the past 2 days. Smoker. EXAM: CHEST  2 VIEW COMPARISON:  06/15/2011. FINDINGS: Normal sized heart. Clear lungs. Minimal diffuse peribronchial thickening. Mild thoracic spine degenerative changes. IMPRESSION: Minimal bronchitic changes. Electronically Signed   By: Percell Locus.D.  On: 04/08/2017 19:45    Procedures Procedures (including critical care time)  Medications Ordered in ED Medications  magic mouthwash w/lidocaine (15 mLs Oral Given 04/08/17 1904)  phenol (CHLORASEPTIC) mouth spray 2 spray (not administered)  sertraline (ZOLOFT) tablet 200 mg (not administered)  acetaminophen (TYLENOL) tablet 650 mg (not administered)  alum & mag hydroxide-simeth (MAALOX/MYLANTA) 200-200-20 MG/5ML suspension 30 mL (not administered)  ibuprofen (ADVIL,MOTRIN) tablet 600 mg (not administered)  nicotine (NICODERM CQ - dosed in mg/24 hours) patch 21 mg (not administered)  zolpidem (AMBIEN) tablet 5 mg (not administered)  promethazine (PHENERGAN) tablet 25 mg (not administered)  potassium chloride SA (K-DUR,KLOR-CON) CR tablet 20 mEq (not administered)  dextromethorphan-guaiFENesin (MUCINEX DM) 30-600 MG per 12 hr tablet 1 tablet (not administered)  sodium chloride 0.9 % bolus 1,000 mL (1,000 mLs Intravenous New Bag/Given 04/08/17 1844)  ondansetron (ZOFRAN) injection 4 mg (4 mg Intravenous Given 04/08/17 1844)  acetaminophen (TYLENOL) tablet 1,000 mg (1,000 mg Oral Given 04/08/17 1844)  potassium chloride SA (K-DUR,KLOR-CON) CR tablet 80 mEq (80 mEq Oral Given 04/08/17 2104)  penicillin g benzathine (BICILLIN LA) 1200000 UNIT/2ML injection 1.2 Million Units (1.2 Million Units Intramuscular Given 04/08/17 2103)     Initial Impression / Assessment and Plan / ED  Course  I have reviewed the triage vital signs and the nursing notes.  Pertinent labs & imaging results that were available during my care of the patient were reviewed by me and considered in my medical decision making (see chart for details).     51 y.o. female here with SI with plan to slit wrists, but also with c/o 2 days of subjective fevers, chills, sore throat, body aches, and cough with green sputum production. On exam, oropharynx coated with white exudates, tonsils 3+ bilaterally and erythematous, no evidence of ludwig's or PTA, b/l tonsillar LAD which is moderately TTP, clear lung exam, low-grade temp 99.1, mildly dry lips. Denies HI/AVH, continues to use heroin (last use 3-4 days ago), +smoker (smoking cessation encouraged), denies EtOH use. Hasn't been on her meds in 3 weeks, doesn't remember what meds she's on except for zoloft. Will get psych clearance labs but also get RST and CXR, give IVFs, zofran, tylenol, chloraseptic spray, and magic mouthwash to gargle and swallow or spit. Will reassess shortly .  8:05 PM EtOH level undetectable. CBC with leukocytosis, will add on differential. CMP with hypokalemia 2.4, oral repletion ordered by Dr. Winfred Leeds, EKG and Mg level ordered; does not want IV potassium ordered, would prefer just PO for now. Salicylate and acetaminophen levels WNL. RST+. CXR with minimal bronchitic changes.  UDS with +opiates/cocaine/benzos/amphetamines but neg for THC.   9:37 PM Differential showing neutrophilic predominance. Mg level WNL. EKG with borderline long QT and nonspecific T changes but no acute ischemic findings or hypokalemic findings. Will repeat potassium level now, and reassess shortly. Pt feeling much better at this time, tolerating PO well, states the magic mouthwash helped significantly. Will continue to monitor and reassess after repeat K level.  10:40 PM Repeat K 3.4 which is excellent; however nursing staff just informed me that the Kdur had been  given only ~30-73mins before the repeat K level was drawn therefore the first value could have been an error; she should be fine with the [place] we gave her tonight, and shouldn't need any further Kdur. She has magic mouthwash order TID PRN, and chloraseptic spray and mucinex DM if needed while she's here. Her strep throat has been treated with bicillin so she should be  fine on that. Pt medically cleared at this time. Psych hold orders and home med orders placed. Please see TTS notes for further documentation of care/dispo. PLEASE NOTE THAT PT IS HERE VOLUNTARILY AT THIS TIME, IF PT TRIES TO LEAVE THEY WOULD NEED IVC PAPERWORK TAKEN OUT. Pt stable at time of med clearance.     Final Clinical Impressions(s) / ED Diagnoses   Final diagnoses:  Suicidal ideation  Tobacco user  Cough  Noncompliance with medications  Strep pharyngitis  Hypokalemia  Polysubstance abuse  Neutrophilic leukocytosis  Hyperglycemia    New Prescriptions New Prescriptions   No medications on [address], [place], [place] 04/08/17 [place]    Orlie Dakin, MD 04/09/17 [phone]

## 2017-04-09 ENCOUNTER — Inpatient Hospital Stay (HOSPITAL_COMMUNITY)
Admission: AD | Admit: 2017-04-09 | Discharge: 2017-04-16 | DRG: 885 | Disposition: A | Discharge status: Home / Self Care | Payer: No Typology Code available for payment source | Source: Intra-hospital | Attending: Psychiatry | Admitting: Psychiatry

## 2017-04-09 ENCOUNTER — Encounter (HOSPITAL_COMMUNITY): Payer: Self-pay | Admitting: *Deleted

## 2017-04-09 DIAGNOSIS — J029 Acute pharyngitis, unspecified: Secondary | ICD-10-CM | POA: Diagnosis present

## 2017-04-09 DIAGNOSIS — Z8249 Family history of ischemic heart disease and other diseases of the circulatory system: Secondary | ICD-10-CM

## 2017-04-09 DIAGNOSIS — R45851 Suicidal ideations: Secondary | ICD-10-CM

## 2017-04-09 DIAGNOSIS — F332 Major depressive disorder, recurrent severe without psychotic features: Secondary | ICD-10-CM

## 2017-04-09 DIAGNOSIS — F1920 Other psychoactive substance dependence, uncomplicated: Secondary | ICD-10-CM | POA: Diagnosis present

## 2017-04-09 DIAGNOSIS — R1310 Dysphagia, unspecified: Secondary | ICD-10-CM | POA: Diagnosis present

## 2017-04-09 DIAGNOSIS — Z881 Allergy status to other antibiotic agents status: Secondary | ICD-10-CM | POA: Diagnosis not present

## 2017-04-09 DIAGNOSIS — F17210 Nicotine dependence, cigarettes, uncomplicated: Secondary | ICD-10-CM

## 2017-04-09 DIAGNOSIS — F909 Attention-deficit hyperactivity disorder, unspecified type: Secondary | ICD-10-CM | POA: Diagnosis present

## 2017-04-09 DIAGNOSIS — Z981 Arthrodesis status: Secondary | ICD-10-CM

## 2017-04-09 DIAGNOSIS — Z79899 Other long term (current) drug therapy: Secondary | ICD-10-CM

## 2017-04-09 DIAGNOSIS — F1120 Opioid dependence, uncomplicated: Secondary | ICD-10-CM

## 2017-04-09 DIAGNOSIS — Z885 Allergy status to narcotic agent status: Secondary | ICD-10-CM | POA: Diagnosis not present

## 2017-04-09 DIAGNOSIS — M549 Dorsalgia, unspecified: Secondary | ICD-10-CM | POA: Diagnosis present

## 2017-04-09 DIAGNOSIS — G4700 Insomnia, unspecified: Secondary | ICD-10-CM | POA: Diagnosis present

## 2017-04-09 DIAGNOSIS — Z90710 Acquired absence of both cervix and uterus: Secondary | ICD-10-CM

## 2017-04-09 DIAGNOSIS — B379 Candidiasis, unspecified: Secondary | ICD-10-CM | POA: Diagnosis present

## 2017-04-09 DIAGNOSIS — Z590 Homelessness: Secondary | ICD-10-CM | POA: Diagnosis not present

## 2017-04-09 DIAGNOSIS — Z888 Allergy status to other drugs, medicaments and biological substances status: Secondary | ICD-10-CM | POA: Diagnosis not present

## 2017-04-09 DIAGNOSIS — G8929 Other chronic pain: Secondary | ICD-10-CM | POA: Diagnosis present

## 2017-04-09 DIAGNOSIS — F330 Major depressive disorder, recurrent, mild: Secondary | ICD-10-CM | POA: Diagnosis present

## 2017-04-09 DIAGNOSIS — F4310 Post-traumatic stress disorder, unspecified: Secondary | ICD-10-CM | POA: Diagnosis present

## 2017-04-09 MED ORDER — CLONIDINE HCL 0.1 MG PO TABS
0.1000 mg | ORAL_TABLET | Freq: Four times a day (QID) | ORAL | Status: AC
  Administered 2017-04-09 – 2017-04-11 (×8): 0.1 mg via ORAL
  Filled 2017-04-09 (×13): qty 1

## 2017-04-09 MED ORDER — CLONIDINE HCL 0.1 MG PO TABS
0.1000 mg | ORAL_TABLET | ORAL | Status: AC
  Administered 2017-04-12 – 2017-04-13 (×4): 0.1 mg via ORAL
  Filled 2017-04-09 (×4): qty 1

## 2017-04-09 MED ORDER — MAGIC MOUTHWASH W/LIDOCAINE
15.0000 mL | Freq: Three times a day (TID) | ORAL | Status: DC | PRN
  Filled 2017-04-09 (×2): qty 15

## 2017-04-09 MED ORDER — METHOCARBAMOL 500 MG PO TABS
500.0000 mg | ORAL_TABLET | Freq: Three times a day (TID) | ORAL | Status: AC | PRN
  Administered 2017-04-09 – 2017-04-13 (×3): 500 mg via ORAL
  Filled 2017-04-09 (×3): qty 1

## 2017-04-09 MED ORDER — LOPERAMIDE HCL 2 MG PO CAPS
2.0000 mg | ORAL_CAPSULE | ORAL | Status: AC | PRN
  Administered 2017-04-09 – 2017-04-12 (×2): 4 mg via ORAL
  Administered 2017-04-13 (×2): 2 mg via ORAL
  Administered 2017-04-13: 4 mg via ORAL
  Administered 2017-04-13 – 2017-04-14 (×2): 2 mg via ORAL
  Filled 2017-04-09: qty 1
  Filled 2017-04-09: qty 2
  Filled 2017-04-09 (×2): qty 1
  Filled 2017-04-09 (×2): qty 2
  Filled 2017-04-09: qty 1

## 2017-04-09 MED ORDER — SERTRALINE HCL 100 MG PO TABS
200.0000 mg | ORAL_TABLET | Freq: Every day | ORAL | Status: DC
  Administered 2017-04-10 – 2017-04-15 (×6): 200 mg via ORAL
  Filled 2017-04-09: qty 2
  Filled 2017-04-09: qty 28
  Filled 2017-04-09 (×7): qty 2

## 2017-04-09 MED ORDER — DM-GUAIFENESIN ER 30-600 MG PO TB12: 1.0000 | ORAL_TABLET | Freq: Two times a day (BID) | ORAL | Status: DC | PRN

## 2017-04-09 MED ORDER — ONDANSETRON 4 MG PO TBDP
4.0000 mg | ORAL_TABLET | Freq: Four times a day (QID) | ORAL | Status: AC | PRN
  Administered 2017-04-09 – 2017-04-13 (×3): 4 mg via ORAL
  Filled 2017-04-09 (×3): qty 1

## 2017-04-09 MED ORDER — ACETAMINOPHEN 325 MG PO TABS
650.0000 mg | ORAL_TABLET | Freq: Four times a day (QID) | ORAL | Status: DC | PRN
  Administered 2017-04-09 – 2017-04-14 (×5): 650 mg via ORAL
  Filled 2017-04-09 (×5): qty 2

## 2017-04-09 MED ORDER — PNEUMOCOCCAL VAC POLYVALENT 25 MCG/0.5ML IJ INJ: 0.5000 mL | INJECTION | INTRAMUSCULAR | Status: DC

## 2017-04-09 MED ORDER — IBUPROFEN 600 MG PO TABS
600.0000 mg | ORAL_TABLET | Freq: Three times a day (TID) | ORAL | Status: DC | PRN
  Administered 2017-04-09 – 2017-04-11 (×4): 600 mg via ORAL
  Filled 2017-04-09 (×4): qty 1

## 2017-04-09 MED ORDER — CLONIDINE HCL 0.1 MG PO TABS
0.1000 mg | ORAL_TABLET | Freq: Every day | ORAL | Status: AC
  Administered 2017-04-14 – 2017-04-15 (×2): 0.1 mg via ORAL
  Filled 2017-04-09 (×2): qty 1

## 2017-04-09 MED ORDER — TRAZODONE HCL 50 MG PO TABS
50.0000 mg | ORAL_TABLET | Freq: Every evening | ORAL | Status: DC | PRN
  Administered 2017-04-09 – 2017-04-12 (×7): 50 mg via ORAL
  Filled 2017-04-09 (×13): qty 1

## 2017-04-09 MED ORDER — NICOTINE 21 MG/24HR TD PT24
21.0000 mg | MEDICATED_PATCH | Freq: Every day | TRANSDERMAL | Status: DC
  Administered 2017-04-11 – 2017-04-15 (×5): 21 mg via TRANSDERMAL
  Filled 2017-04-09 (×2): qty 1
  Filled 2017-04-09: qty 14
  Filled 2017-04-09 (×6): qty 1

## 2017-04-09 MED ORDER — GABAPENTIN 300 MG PO CAPS
300.0000 mg | ORAL_CAPSULE | Freq: Two times a day (BID) | ORAL | Status: DC
  Administered 2017-04-09: 300 mg via ORAL
  Filled 2017-04-09: qty 1

## 2017-04-09 MED ORDER — GABAPENTIN 300 MG PO CAPS
300.0000 mg | ORAL_CAPSULE | Freq: Two times a day (BID) | ORAL | Status: DC
  Administered 2017-04-09 – 2017-04-15 (×13): 300 mg via ORAL
  Filled 2017-04-09 (×5): qty 1
  Filled 2017-04-09: qty 28
  Filled 2017-04-09 (×11): qty 1
  Filled 2017-04-09: qty 28

## 2017-04-09 MED ORDER — DICYCLOMINE HCL 20 MG PO TABS: 20.0000 mg | ORAL_TABLET | Freq: Four times a day (QID) | ORAL | Status: AC | PRN

## 2017-04-09 NOTE — BH Assessment (Signed)
[facility] Assessment Progress Note  Per Corena Pilgrim, MD, this pt requires psychiatric hospitalization at this time.  Letitia Libra, RN, [facility] has assigned pt to [facility] Rm 405-1; they will be ready to receive pt at 14:00.  Pt has signed Voluntary Admission and Consent for Treatment, as well as Consent to Release Information to her providers at [facility], and a notification call has been placed.  Signed forms have been faxed to [facility].  Pt's nurse has been notified, and agrees to send original paperwork along with pt via [facility], and to call report to [phone].  Jalene Mullet, [facility] Triage Specialist [phone]

## 2017-04-09 NOTE — ED Notes (Signed)
[facility] notified that patient needs to be transferred to [facility].

## 2017-04-09 NOTE — ED Notes (Signed)
Patient reports episode of diarrhea this was unobserved by staff.

## 2017-04-09 NOTE — Progress Notes (Signed)
Patient did not attend AA group meeting. 

## 2017-04-09 NOTE — Progress Notes (Signed)
Admission Note:  51 year female who presents voluntary, in no acute distress, for the treatment of SI and Substance Abuse. Patient appears anxious and depressed and has complaints of withdrawal symptoms reporting "Diarrhea, Nausea, Anxiety, and Irritability". Patient was cooperative with admission process. Patient presents with passive SI and contracts for safety upon admission. Patient reports, prior to admission, SI with a plan to cut wrist.  Patient denies AVH.  Patient identifies multiple stressors to include "recent breakup with boyfriend, homeless for the past 3 weeks, and been off of my medications for 3 weeks". Patient reports that she normally takes Zoloft.  Patient reports that she is currently withdrawing from Heroin and reports occasional crack use.  Patient identifies her ex boyfriend, Aaron Edelman, and patient's niece as her support system.  Patient would like to go to a longterm rehab facility on discharge.   While at [facility], patient would like to "become unsuicidal", "be more positive", find "longterm tx", "housing", and "money for medications".  Skin was assessed and found to be clear of any abnormal marks apart from old, healed, scars. Patient searched and no contraband found, POC and unit policies explained and understanding verbalized. Consents obtained.  Report given to accepting nurse.  Patient placed on q 15 minute, routine safety checks.  Patient had no additional questions or concerns.

## 2017-04-09 NOTE — ED Notes (Signed)
[facility] here to  transferred to [facility]

## 2017-04-09 NOTE — Consult Note (Signed)
[facility] Face-to-Face Psychiatry Consult   Reason for Consult:  Substance abuse with suicidal ideations Referring Physician:  EDP Patient Identification: [name] MRN:  [id number] Principal Diagnosis: MDD (major depressive disorder), recurrent episode, severe ([place]) Diagnosis:   Patient Active Problem List   Diagnosis Date Noted  . MDD (major depressive disorder), recurrent episode, severe ([place]) [F33.2] 04/09/2017    Priority: High  . Polysubstance (including opioids) dependence with physiological dependence ([place]) [F19.20] 04/09/2017    Priority: High  . Chronic back pain [M54.9, G89.29] 12/06/2013  . PTSD (post-traumatic stress disorder) [F43.10] 12/06/2013  . Other screening mammogram [Z12.31] 12/06/2013  . ADD (attention deficit disorder) [F98.8] 12/06/2013    Total Time spent with patient: 45 minutes  Subjective:   [name] is a 51 y.o. female patient admitted with suicide plan.  HPI:  51 yo female who presented to the ED with suicidal ideations and plan to overdose.  She has been homeless for two weeks and was living in her car but it is now broken and she does not know what to do.  Minimizes her polysubstance abuse burt no withdrawal symptoms noted.  She does see Milinda Antis at [facility] for PTSD and depression.  No homicidal ideations or hallucinations.  Past Psychiatric History: PTSD, depression, substance abuse  Risk to Self: Suicidal Ideation: Yes-Currently Present Suicidal Intent: No-Not Currently/Within Last 6 Months Is patient at risk for suicide?: Yes Suicidal Plan?: No-Not Currently/Within Last 6 Months Access to Means: No What has been your use of drugs/alcohol within the last 12 months?: reports to using heroin and cocaine use. Labs positive for meth and benzos on arrival to ED  How many times?: 8 Triggers for Past Attempts: Unpredictable Intentional Self Injurious Behavior: None Risk to Others: Homicidal Ideation: No Thoughts of Harm to  Others: No Current Homicidal Intent: No Current Homicidal Plan: No Access to Homicidal Means: No History of harm to others?: No Assessment of Violence: None Noted Does patient have access to weapons?: No Criminal Charges Pending?: Yes Describe Pending Criminal Charges: possession of stolen property  Does patient have a court date: Yes Court Date: 05/17/17 Prior Inpatient Therapy: Prior Inpatient Therapy: Yes Prior Therapy Dates: 2014, 2012, 2011 Prior Therapy Facilty/Provider(s): [facility] Reason for Treatment: MDD Prior Outpatient Therapy: Prior Outpatient Therapy: Yes Prior Therapy Dates: current Prior Therapy Facilty/Provider(s): [facility] Reason for Treatment: MDD, MED MANAGEMENT Does patient have an ACCT team?: No Does patient have Intensive In-House Services?  : No Does patient have [facility] services? : No Does patient have P4CC services?: No  Past Medical History:  Past Medical History:  Diagnosis Date  . ADD (attention deficit disorder with hyperactivity)   . Bilateral ovarian cysts   . Chlamydia   . Depression   . PID (acute pelvic inflammatory disease)   . PTSD (post-traumatic stress disorder)   . Sciatica     Past Surgical History:  Procedure Laterality Date  . ABDOMINAL HYSTERECTOMY    . BACK SURGERY     dbl lumbar fusion L5-S1  . BLADDER SURGERY    . LAPAROSCOPY    . mesh removel    . ROTATOR CUFF REPAIR Right 02/15/2013   Family History:  Family History  Problem Relation Age of Onset  . Hypertension Mother   . Heart failure Mother   . Hypertension Father   . Heart failure Father   . Hypertension Sister    Family Psychiatric  History: unknown Social History:  History  Alcohol Use No     History  Drug Use    Comment: heroine, molly    Social History   Social History  . Marital status: Divorced    Spouse name: N/A  . Number of children: N/A  . Years of education: N/A   Social History Main Topics  . Smoking  status: Current Every Day Smoker    Packs/day: 0.25    Years: 14.00    Types: Cigarettes  . Smokeless tobacco: Current User  . Alcohol use No  . Drug use: Yes     Comment: heroine, molly  . Sexual activity: Yes    Birth control/ protection: Other-see comments     Comment: hysterectomy   Other Topics Concern  . None   Social History Narrative  . None   Additional Social History:    Allergies:   Allergies  Allergen Reactions  . Imitrex [Sumatriptan Base] Shortness Of Breath  . Clarithromycin Nausea And Vomiting  . Clindamycin/Lincomycin Diarrhea    Violently sick-projectile vomiting  . Morphine And Related Nausea And Vomiting  . Vicodin [Hydrocodone-Acetaminophen] Nausea And Vomiting    Labs:  Results for orders placed or performed during the hospital encounter of 04/08/17 (from the past 48 hour(s))  Rapid urine drug screen (hospital performed)     Status: Abnormal   Collection Time: 04/08/17  6:02 PM  Result Value Ref Range   Opiates POSITIVE (A) NONE DETECTED   Cocaine POSITIVE (A) NONE DETECTED   Benzodiazepines POSITIVE (A) NONE DETECTED   Amphetamines POSITIVE (A) NONE DETECTED   Tetrahydrocannabinol NONE DETECTED NONE DETECTED   Barbiturates NONE DETECTED NONE DETECTED    Comment:        DRUG SCREEN FOR MEDICAL PURPOSES ONLY.  IF CONFIRMATION IS NEEDED FOR ANY PURPOSE, NOTIFY LAB WITHIN 5 DAYS.        LOWEST DETECTABLE LIMITS FOR URINE DRUG SCREEN Drug Class       Cutoff (ng/mL) Amphetamine      1000 Barbiturate      200 Benzodiazepine   295 Tricyclics       284 Opiates          300 Cocaine          300 THC              50   Comprehensive metabolic panel     Status: Abnormal   Collection Time: 04/08/17  6:12 PM  Result Value Ref Range   Sodium 136 135 - 145 mmol/L   Potassium 2.4 (LL) 3.5 - 5.1 mmol/L    Comment: CRITICAL RESULT CALLED TO, READ BACK BY AND VERIFIED WITH: B.BROOKS AT 1950 ON 04/08/17 BY N.THOMPSON    Chloride 98 (L) 101 - 111  mmol/L   CO2 29 22 - 32 mmol/L   Glucose, Bld 193 (H) 65 - 99 mg/dL   BUN <5 (L) 6 - 20 mg/dL   Creatinine, Ser 0.81 0.44 - 1.00 mg/dL   Calcium 8.5 (L) 8.9 - 10.3 mg/dL   Total Protein 7.3 6.5 - 8.1 g/dL   Albumin 3.6 3.5 - 5.0 g/dL   AST 30 15 - 41 U/L   ALT 15 14 - 54 U/L   Alkaline Phosphatase 121 38 - 126 U/L   Total Bilirubin 0.5 0.3 - 1.2 mg/dL   GFR calc non Af Amer >60 >60 mL/min   GFR calc Af Amer >60 >60 mL/min    Comment: (NOTE) The eGFR has been calculated using the CKD EPI  equation. This calculation has not been validated in all clinical situations. eGFR's persistently <60 mL/min signify possible Chronic Kidney Disease.    Anion gap 9 5 - 15  Ethanol     Status: None   Collection Time: 04/08/17  6:12 PM  Result Value Ref Range   Alcohol, Ethyl (B) <5 <5 mg/dL    Comment:        LOWEST DETECTABLE LIMIT FOR SERUM ALCOHOL IS 5 mg/dL FOR MEDICAL PURPOSES ONLY   Salicylate level     Status: None   Collection Time: 04/08/17  6:12 PM  Result Value Ref Range   Salicylate Lvl <1.9 2.8 - 30.0 mg/dL  Acetaminophen level     Status: Abnormal   Collection Time: 04/08/17  6:12 PM  Result Value Ref Range   Acetaminophen (Tylenol), Serum <10 (L) 10 - 30 ug/mL    Comment:        THERAPEUTIC CONCENTRATIONS VARY SIGNIFICANTLY. A RANGE OF 10-30 ug/mL MAY BE AN EFFECTIVE CONCENTRATION FOR MANY PATIENTS. HOWEVER, SOME ARE BEST TREATED AT CONCENTRATIONS OUTSIDE THIS RANGE. ACETAMINOPHEN CONCENTRATIONS >150 ug/mL AT 4 HOURS AFTER INGESTION AND >50 ug/mL AT 12 HOURS AFTER INGESTION ARE OFTEN ASSOCIATED WITH TOXIC REACTIONS.   cbc     Status: Abnormal   Collection Time: 04/08/17  6:12 PM  Result Value Ref Range   WBC 24.8 (H) 4.0 - 10.5 K/uL   RBC 4.68 3.87 - 5.11 MIL/uL   Hemoglobin 13.5 12.0 - 15.0 g/dL   HCT 40.3 36.0 - 46.0 %   MCV 86.1 78.0 - 100.0 fL   MCH 28.8 26.0 - 34.0 pg   MCHC 33.5 30.0 - 36.0 g/dL   RDW 13.6 11.5 - 15.5 %   Platelets 387 150 - 400 K/uL   Magnesium     Status: None   Collection Time: 04/08/17  6:12 PM  Result Value Ref Range   Magnesium 1.8 1.7 - 2.4 mg/dL  Differential     Status: Abnormal   Collection Time: 04/08/17  6:12 PM  Result Value Ref Range   Neutrophils Relative % 88 %   Neutro Abs 20.5 (H) 1.7 - 7.7 K/uL   Lymphocytes Relative 6 %   Lymphs Abs 1.3 0.7 - 4.0 K/uL   Monocytes Relative 6 %   Monocytes Absolute 1.3 (H) 0.1 - 1.0 K/uL   Eosinophils Relative 0 %   Eosinophils Absolute 0.0 0.0 - 0.7 K/uL   Basophils Relative 0 %   Basophils Absolute 0.1 0.0 - 0.1 K/uL  Rapid strep screen (not at [facility])     Status: Abnormal   Collection Time: 04/08/17  6:24 PM  Result Value Ref Range   Streptococcus, Group A Screen (Direct) POSITIVE (A) NEGATIVE  Potassium     Status: Abnormal   Collection Time: 04/08/17  9:37 PM  Result Value Ref Range   Potassium 3.4 (L) 3.5 - 5.1 mmol/L    Comment: DELTA CHECK NOTED SLIGHT HEMOLYSIS     Current Facility-Administered Medications  Medication Dose Route Frequency Provider Last Rate Last Dose  . acetaminophen (TYLENOL) tablet 650 mg  650 mg Oral Q4H PRN Street, [place], [place]      . alum & mag hydroxide-simeth (MAALOX/MYLANTA) 200-200-20 MG/5ML suspension 30 mL  30 mL Oral Q6H PRN Street, [place], PA-C      . dextromethorphan-guaiFENesin (MUCINEX DM) 30-600 MG per 12 hr tablet 1 tablet  1 tablet Oral BID PRN Street, [place], [place]      . gabapentin (NEURONTIN) capsule  300 mg  300 mg Oral BID Garcia Dalzell, MD      . ibuprofen (ADVIL,MOTRIN) tablet 600 mg  600 mg Oral Q8H PRN Street, [place], [place]   600 mg at 04/09/17 0858  . magic mouthwash w/lidocaine  15 mL Oral TID PRN Street, [place], [place]   15 mL at 04/08/17 1904  . nicotine (NICODERM CQ - dosed in mg/24 hours) patch 21 mg  21 mg Transdermal Daily [address] [place], [place]   21 mg at 04/09/17 0859  . phenol (CHLORASEPTIC) mouth spray 2 spray  2 spray Mouth/Throat PRN Street, [place], [place]      . promethazine  (PHENERGAN) tablet 25 mg  25 mg Oral Q6H PRN Street, [place], [place]      . sertraline (ZOLOFT) tablet 200 mg  200 mg Oral Daily [address] [place], [place]   200 mg at 04/09/17 7639   Current Outpatient Prescriptions  Medication Sig Dispense Refill  . acetaminophen (TYLENOL) 325 MG tablet Take 650 mg by mouth every 6 (six) hours as needed for moderate pain or headache.    . ibuprofen (ADVIL,MOTRIN) 200 MG tablet Take 200 mg by mouth every 6 (six) hours as needed for moderate pain.    Marland Kitchen Phenylephrine-DM (THERAFLU COLD/COUGH DAYTIME PO) Take 30 mLs by mouth daily as needed (cold symptoms).    . sertraline (ZOLOFT) 100 MG tablet Take 2 tablets (200 mg total) by mouth daily.    Marland Kitchen ALPRAZolam (XANAX) 1 MG tablet Take 1 tablet (1 mg total) by mouth 3 (three) times daily as needed. anxiety (Patient not taking: Reported on 04/08/2017) 30 tablet 0  . ibuprofen (ADVIL,MOTRIN) 600 MG tablet Take 1 tablet (600 mg total) by mouth every 6 (six) hours as needed. (Patient not taking: Reported on 04/08/2017) 30 tablet 0  . lidocaine (LIDODERM) 5 % Place 1 patch onto the skin daily. Remove & Discard patch within 12 hours or as directed by MD (Patient not taking: Reported on 04/08/2017) 30 patch 0  . methocarbamol (ROBAXIN) 500 MG tablet Take 1 tablet (500 mg total) by mouth every 6 (six) hours as needed for muscle spasms. (Patient not taking: Reported on 04/08/2017) 12 tablet 0    Musculoskeletal: Strength & Muscle Tone: within normal limits Gait & Station: normal Patient leans: N/A  Psychiatric Specialty Exam: Physical Exam  Constitutional: She is oriented to person, place, and time. She appears well-developed and well-nourished.  HENT:  Head: Normocephalic.  Neck: Normal range of motion.  Respiratory: Effort normal.  Musculoskeletal: Normal range of motion.  Neurological: She is alert and oriented to person, place, and time.  Psychiatric: Her speech is normal and behavior is normal. Judgment normal. Cognition and  memory are normal. She exhibits a depressed mood. She expresses suicidal ideation. She expresses suicidal plans.    Review of Systems  Psychiatric/Behavioral: Positive for depression, substance abuse and suicidal ideas.  All other systems reviewed and are negative.   Blood pressure 123/69, pulse (!) 57, temperature 98.9 F (37.2 C), temperature source Oral, resp. rate 13, SpO2 96 %.There is no height or weight on file to calculate BMI.  General Appearance: Disheveled  Eye Contact:  Fair  Speech:  Normal Rate  Volume:  Decreased  Mood:  Depressed  Affect:  Congruent  Thought Process:  Coherent and Descriptions of Associations: Intact  Orientation:  Full (Time, Place, and Person)  Thought Content:  Rumination  Suicidal Thoughts:  Yes.  with intent/plan  Homicidal Thoughts:  No  Memory:  Immediate;   Fair  Recent;   Fair Remote;   Fair  Judgement:  Poor  Insight:  Fair  Psychomotor Activity:  Decreased  Concentration:  Concentration: Fair and Attention Span: Fair  Recall:  [employer] of Knowledge:  Fair  Language:  Good  Akathisia:  No  Handed:  Right  AIMS (if indicated):     Assets:  Leisure Time Physical Health Resilience  ADL's:  Intact  Cognition:  WNL  Sleep:        Treatment Plan Summary: Daily contact with patient to assess and evaluate symptoms and progress in treatment, Medication management and Plan major depressive disorder, recurrent, severe without psychosis:  -Crisis stabilization -Medication management: Restarted Zoloft 200 mg daily for depression and Gabapentin 300 mg TID for withdrawal symptoms, medical medications for strept throat started by EDP and continued -Individual and substance abuse counseling  Disposition: Recommend psychiatric Inpatient admission when medically cleared.  Waylan Boga, NP 04/09/2017 11:00 AM  Patient seen face-to-face for psychiatric evaluation, chart reviewed and case discussed with the physician extender and developed  treatment plan. Reviewed the information documented and agree with the treatment plan. Corena Pilgrim, MD

## 2017-04-09 NOTE — Progress Notes (Signed)
Patient received treatment of PCN injection approximately 2100 yesterday.  Infection control consulted, RN was told patient does not need to be placed on any contact precautions, but needs to be educated in proper hand hygeine.  RN educated patient in hand hygeine.  Patient c/o opiate withdrawal symptoms, diarrhea, generalized aches, nausea.  Received order for clonidine protocol.  PRN's given for symptoms along with scheduled clonidine.  Oncoming shift to follow up.

## 2017-04-09 NOTE — Tx Team (Signed)
Initial Treatment Plan 04/09/2017 3:42 PM AMORITA VANROSSUM RCB:[id number]    PATIENT STRESSORS: Financial difficulties Loss of relationship Substance abuse   PATIENT STRENGTHS: Ability for insight Communication skills Physical Health Supportive family/friends   PATIENT IDENTIFIED PROBLEMS: At risk for suicide  Substance Abuse  "Become unsuicidal"  "Be positive"  "Longterm rehab"  "Housing"  "Financial help with medications"         DISCHARGE CRITERIA:  Adequate post-discharge living arrangements Motivation to continue treatment in a less acute level of care Need for constant or close observation no longer present Reduction of life-threatening or endangering symptoms to within safe limits Verbal commitment to aftercare and medication compliance Withdrawal symptoms are absent or subacute and managed without 24-hour nursing intervention  PRELIMINARY DISCHARGE PLAN: Attend 12-step recovery group Outpatient therapy Placement in alternative living arrangements  PATIENT/FAMILY INVOLVEMENT: This treatment plan has been presented to and reviewed with the patient, [name].  The patient and family have been given the opportunity to ask questions and make suggestions.  Dustin Flock, RN 04/09/2017, 3:42 PM

## 2017-04-10 DIAGNOSIS — F332 Major depressive disorder, recurrent severe without psychotic features: Principal | ICD-10-CM

## 2017-04-10 DIAGNOSIS — F17210 Nicotine dependence, cigarettes, uncomplicated: Secondary | ICD-10-CM

## 2017-04-10 DIAGNOSIS — F1920 Other psychoactive substance dependence, uncomplicated: Secondary | ICD-10-CM

## 2017-04-10 DIAGNOSIS — R45851 Suicidal ideations: Secondary | ICD-10-CM

## 2017-04-10 NOTE — Progress Notes (Signed)
Data. Patient denies HI/AVH. Patient does endorse SI, with a plan for cutting her wrists, "But I can't do that here." Patient does verbally contract for safety on the unit and to come to staff before acting on any SI. Patient spent most of the shift in her room, sleeping. She did C/O pain in her throat and received PRNs and a a warm salt water gurgle. Also pushing fluids and patient is eating small amounts of soft foods.  Action. Emotional support and encouragement offered. Education provided on medication, indications and side effect. Q 15 minute checks done for safety. Response. Safety on the unit maintained through 15 minute checks.  Medications taken as prescribed. Attended groups. Remained calm and appropriate through out shift.
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
## 2017-04-10 NOTE — Progress Notes (Signed)
Patient did not attend the evening speaker [place] meeting. Pt was notified that group was beginning but remained in bed reporting too sick to participate. Pt reports unable to keep food down.

## 2017-04-10 NOTE — [facility] Group Notes (Signed)
[facility] LCSW Group Therapy Note  04/10/2017  and  10:15 AM  Type of Therapy and Topic:  Group Therapy: Avoiding Self-Sabotaging and Enabling Behaviors  Participation Level:  Did Not Attend; invited to participate yet did not despite overhead announcement and encouragement by staff  Sheilah Pigeon, LCSW

## 2017-04-10 NOTE — H&P (Signed)
Psychiatric Admission Assessment Adult  Patient Identification: [name] MRN:  [id number] Date of Evaluation:  04/10/2017 Chief Complaint:  MDD REC MODERATE WITHOUT PSYCHOTIC FEATURES Principal Diagnosis: Major depressive disorder, recurrent severe without psychotic features ([place]) Diagnosis:   Patient Active Problem List   Diagnosis Date Noted  . MDD (major depressive disorder), recurrent episode, severe ([place]) [F33.2] 04/09/2017  . Polysubstance (including opioids) dependence with physiological dependence ([place]) [F19.20] 04/09/2017  . Major depressive disorder, recurrent severe without psychotic features ([place]) [F33.2] 04/09/2017  . Chronic back pain [M54.9, G89.29] 12/06/2013  . PTSD (post-traumatic stress disorder) [F43.10] 12/06/2013  . Other screening mammogram [Z12.31] 12/06/2013  . ADD (attention deficit disorder) [F98.8] 12/06/2013   History of Present Illness: [name] is a 51 years old female admitted voluntarily and emergently from [facility] for increased symptoms of depression, anxiety, suicidal ideation with the plan of cutting herself to end her life. The patient reported she become homeless about 3 weeks ago, could not afford her medications Zoloft and could not see any psychiatric providers in the community. Patient reported she broke up with her relationship of her boyfriend for 5 years because he has been staying with his parent's home and she could not stay there. Patient is also not able to work and not able to make money. Patient reportedly living in her car for the last 3 weeks now her car was broken down and she has no place to live. Patient continued to endorse depression, sadness, unhappiness, isolated, withdrawn and suicidal ideation with intention to end her life. Patient reportedly suffering with polysubstance abuse, posttraumatic stress disorder, Maj. depressive disorder and attention deficit hyperactivity disorder along with the chronic  back pains. Patient contract for safety while in the hospital. Patient also suffering with sore throat reportedly started treatment while in the emergency department. Urine drug screen on admission is positive for amphetamine, benzodiazepine, opiates and cocaine.  Associated Signs/Symptoms: Depression Symptoms:  depressed mood, anhedonia, insomnia, psychomotor retardation, feelings of worthlessness/guilt, difficulty concentrating, hopelessness, suicidal thoughts with specific plan, anxiety, loss of energy/fatigue, weight loss, decreased labido, decreased appetite, (Hypo) Manic Symptoms:  Distractibility, Impulsivity, Anxiety Symptoms:  Excessive Worry, Psychotic Symptoms:  denied PTSD Symptoms: NA Total Time spent with patient: 1 hour  Past Psychiatric History: Patient has a history of posttraumatic stress disorder, ADD, I MDD, polysubstance dependence and her previous acute psychiatric hospitalization at [facility] was 2014. Pt has past inpt admissions--"I been in the hospital at least 8 times"; [facility](2012,2011,2009), [facility] and [facility]. Pt currently seeking outpatient services with Lattie Haw Poulos([facility], last visit was 12/2012), no therapist.   Is the patient at risk to self? Yes.    Has the patient been a risk to self in the past 6 months? Yes.    Has the patient been a risk to self within the distant past? No.  Is the patient a risk to others? No.  Has the patient been a risk to others in the past 6 months? No.  Has the patient been a risk to others within the distant past? No.   Prior Inpatient Therapy:   Prior Outpatient Therapy:    Alcohol Screening: 1. How often do you have a drink containing alcohol?: Never 9. Have you or someone else been injured as a result of your drinking?: No 10. Has a relative or friend or a doctor or another health worker been concerned about your drinking or suggested you cut down?: No Alcohol Use Disorder  Identification Test  Final Score (AUDIT): 0 Brief Intervention: AUDIT score less than 7 or less-screening does not suggest unhealthy drinking-brief intervention not indicated Substance Abuse History in the last 12 months:  Yes.   Consequences of Substance Abuse: Medical Consequences:  sore throat, dizziness. Withdrawal Symptoms:   Cramps Headaches Nausea Tremors Previous Psychotropic Medications: Yes  Psychological Evaluations: Yes  Past Medical History:  Past Medical History:  Diagnosis Date  . ADD (attention deficit disorder with hyperactivity)   . Bilateral ovarian cysts   . Chlamydia   . Depression   . PID (acute pelvic inflammatory disease)   . PTSD (post-traumatic stress disorder)   . Sciatica     Past Surgical History:  Procedure Laterality Date  . ABDOMINAL HYSTERECTOMY    . BACK SURGERY     dbl lumbar fusion L5-S1  . BLADDER SURGERY    . LAPAROSCOPY    . mesh removel    . ROTATOR CUFF REPAIR Right 02/15/2013   Family History:  Family History  Problem Relation Age of Onset  . Hypertension Mother   . Heart failure Mother   . Hypertension Father   . Heart failure Father   . Hypertension Sister    Family Psychiatric  History: Patient denied family history of mental illness. Tobacco Screening: Have you used any form of tobacco in the last 30 days? (Cigarettes, Smokeless Tobacco, Cigars, and/or Pipes): Yes Tobacco use, Select all that apply: 5 or more cigarettes per day Are you interested in Tobacco Cessation Medications?: Yes, will notify MD for an order Counseled patient on smoking cessation including recognizing danger situations, developing coping skills and basic information about quitting provided: Yes Social History:  History  Alcohol Use No     History  Drug Use    Comment: heroine, molly    Additional Social History:                           Allergies:   Allergies  Allergen Reactions  . Imitrex [Sumatriptan Base] Shortness Of Breath   . Clarithromycin Nausea And Vomiting  . Clindamycin/Lincomycin Diarrhea    Violently sick-projectile vomiting  . Morphine And Related Nausea And Vomiting  . Vicodin [Hydrocodone-Acetaminophen] Nausea And Vomiting   Lab Results:  Results for orders placed or performed during the hospital encounter of 04/08/17 (from the past 48 hour(s))  Rapid urine drug screen (hospital performed)     Status: Abnormal   Collection Time: 04/08/17  6:02 PM  Result Value Ref Range   Opiates POSITIVE (A) NONE DETECTED   Cocaine POSITIVE (A) NONE DETECTED   Benzodiazepines POSITIVE (A) NONE DETECTED   Amphetamines POSITIVE (A) NONE DETECTED   Tetrahydrocannabinol NONE DETECTED NONE DETECTED   Barbiturates NONE DETECTED NONE DETECTED    Comment:        DRUG SCREEN FOR MEDICAL PURPOSES ONLY.  IF CONFIRMATION IS NEEDED FOR ANY PURPOSE, NOTIFY LAB WITHIN 5 DAYS.        LOWEST DETECTABLE LIMITS FOR URINE DRUG SCREEN Drug Class       Cutoff (ng/mL) Amphetamine      1000 Barbiturate      200 Benzodiazepine   397 Tricyclics       673 Opiates          300 Cocaine          300 THC              50   Comprehensive metabolic panel  Status: Abnormal   Collection Time: 04/08/17  6:12 PM  Result Value Ref Range   Sodium 136 135 - 145 mmol/L   Potassium 2.4 (LL) 3.5 - 5.1 mmol/L    Comment: CRITICAL RESULT CALLED TO, READ BACK BY AND VERIFIED WITH: B.BROOKS AT 1950 ON 04/08/17 BY N.THOMPSON    Chloride 98 (L) 101 - 111 mmol/L   CO2 29 22 - 32 mmol/L   Glucose, Bld 193 (H) 65 - 99 mg/dL   BUN <5 (L) 6 - 20 mg/dL   Creatinine, Ser 0.81 0.44 - 1.00 mg/dL   Calcium 8.5 (L) 8.9 - 10.3 mg/dL   Total Protein 7.3 6.5 - 8.1 g/dL   Albumin 3.6 3.5 - 5.0 g/dL   AST 30 15 - 41 U/L   ALT 15 14 - 54 U/L   Alkaline Phosphatase 121 38 - 126 U/L   Total Bilirubin 0.5 0.3 - 1.2 mg/dL   GFR calc non Af Amer >60 >60 mL/min   GFR calc Af Amer >60 >60 mL/min    Comment: (NOTE) The eGFR has been calculated using the  CKD EPI equation. This calculation has not been validated in all clinical situations. eGFR's persistently <60 mL/min signify possible Chronic Kidney Disease.    Anion gap 9 5 - 15  Ethanol     Status: None   Collection Time: 04/08/17  6:12 PM  Result Value Ref Range   Alcohol, Ethyl (B) <5 <5 mg/dL    Comment:        LOWEST DETECTABLE LIMIT FOR SERUM ALCOHOL IS 5 mg/dL FOR MEDICAL PURPOSES ONLY   Salicylate level     Status: None   Collection Time: 04/08/17  6:12 PM  Result Value Ref Range   Salicylate Lvl <7.4 2.8 - 30.0 mg/dL  Acetaminophen level     Status: Abnormal   Collection Time: 04/08/17  6:12 PM  Result Value Ref Range   Acetaminophen (Tylenol), Serum <10 (L) 10 - 30 ug/mL    Comment:        THERAPEUTIC CONCENTRATIONS VARY SIGNIFICANTLY. A RANGE OF 10-30 ug/mL MAY BE AN EFFECTIVE CONCENTRATION FOR MANY PATIENTS. HOWEVER, SOME ARE BEST TREATED AT CONCENTRATIONS OUTSIDE THIS RANGE. ACETAMINOPHEN CONCENTRATIONS >150 ug/mL AT 4 HOURS AFTER INGESTION AND >50 ug/mL AT 12 HOURS AFTER INGESTION ARE OFTEN ASSOCIATED WITH TOXIC REACTIONS.   cbc     Status: Abnormal   Collection Time: 04/08/17  6:12 PM  Result Value Ref Range   WBC 24.8 (H) 4.0 - 10.5 K/uL   RBC 4.68 3.87 - 5.11 MIL/uL   Hemoglobin 13.5 12.0 - 15.0 g/dL   HCT 40.3 36.0 - 46.0 %   MCV 86.1 78.0 - 100.0 fL   MCH 28.8 26.0 - 34.0 pg   MCHC 33.5 30.0 - 36.0 g/dL   RDW 13.6 11.5 - 15.5 %   Platelets 387 150 - 400 K/uL  Magnesium     Status: None   Collection Time: 04/08/17  6:12 PM  Result Value Ref Range   Magnesium 1.8 1.7 - 2.4 mg/dL  Differential     Status: Abnormal   Collection Time: 04/08/17  6:12 PM  Result Value Ref Range   Neutrophils Relative % 88 %   Neutro Abs 20.5 (H) 1.7 - 7.7 K/uL   Lymphocytes Relative 6 %   Lymphs Abs 1.3 0.7 - 4.0 K/uL   Monocytes Relative 6 %   Monocytes Absolute 1.3 (H) 0.1 - 1.0 K/uL   Eosinophils Relative 0 %  Eosinophils Absolute 0.0 0.0 - 0.7 K/uL    Basophils Relative 0 %   Basophils Absolute 0.1 0.0 - 0.1 K/uL  Rapid strep screen (not at [facility])     Status: Abnormal   Collection Time: 04/08/17  6:24 PM  Result Value Ref Range   Streptococcus, Group A Screen (Direct) POSITIVE (A) NEGATIVE  Potassium     Status: Abnormal   Collection Time: 04/08/17  9:37 PM  Result Value Ref Range   Potassium 3.4 (L) 3.5 - 5.1 mmol/L    Comment: DELTA CHECK NOTED SLIGHT HEMOLYSIS     Blood Alcohol level:  Lab Results  Component Value Date   ETH <5 04/08/2017   ETH <11 76/73/4193    Metabolic Disorder Labs:  No results found for: HGBA1C, MPG No results found for: PROLACTIN No results found for: CHOL, TRIG, HDL, CHOLHDL, VLDL, LDLCALC  Current Medications: Current Facility-Administered Medications  Medication Dose Route Frequency Provider Last Rate Last Dose  . acetaminophen (TYLENOL) tablet 650 mg  650 mg Oral Q6H PRN [name] A, NP   650 mg at 04/10/17 0850  . cloNIDine (CATAPRES) tablet 0.1 mg  0.1 mg Oral QID Okonkwo, Justina A, NP   0.1 mg at 04/10/17 0848   Followed by  . [START ON 04/12/2017] cloNIDine (CATAPRES) tablet 0.1 mg  0.1 mg Oral BH-qamhs Okonkwo, Justina A, NP       Followed by  . [START ON 04/14/2017] cloNIDine (CATAPRES) tablet 0.1 mg  0.1 mg Oral QAC breakfast Okonkwo, Justina A, NP      . dextromethorphan-guaiFENesin (MUCINEX DM) 30-600 MG per 12 hr tablet 1 tablet  1 tablet Oral BID PRN [name], NP      . dicyclomine (BENTYL) tablet 20 mg  20 mg Oral Q6H PRN Okonkwo, Justina A, NP      . gabapentin (NEURONTIN) capsule 300 mg  300 mg Oral BID [name], NP   300 mg at 04/10/17 0849  . ibuprofen (ADVIL,MOTRIN) tablet 600 mg  600 mg Oral Q8H PRN [name], NP   600 mg at 04/10/17 7902  . loperamide (IMODIUM) capsule 2-4 mg  2-4 mg Oral PRN Lu Duffel, Justina A, NP   4 mg at 04/09/17 1754  . magic mouthwash w/lidocaine  15 mL Oral TID PRN [name], NP      . methocarbamol (ROBAXIN) tablet 500 mg   500 mg Oral Q8H PRN Okonkwo, Justina A, NP   500 mg at 04/09/17 1753  . nicotine (NICODERM CQ - dosed in mg/24 hours) patch 21 mg  21 mg Transdermal Daily Lord, Jamison Y, NP      . ondansetron (ZOFRAN-ODT) disintegrating tablet 4 mg  4 mg Oral Q6H PRN Okonkwo, Justina A, NP   4 mg at 04/09/17 1754  . sertraline (ZOLOFT) tablet 200 mg  200 mg Oral Daily [name], NP   200 mg at 04/10/17 0849  . traZODone (DESYREL) tablet 50 mg  50 mg Oral QHS,MR X 1 [name] A, NP   50 mg at 04/09/17 2106   PTA Medications: Prescriptions Prior to Admission  Medication Sig Dispense Refill Last Dose  . acetaminophen (TYLENOL) 325 MG tablet Take 650 mg by mouth every 6 (six) hours as needed for moderate pain or headache.   04/08/2017 at Unknown time  . ibuprofen (ADVIL,MOTRIN) 200 MG tablet Take 200 mg by mouth every 6 (six) hours as needed for moderate pain.   04/08/2017 at Unknown time  .  ibuprofen (ADVIL,MOTRIN) 600 MG tablet Take 1 tablet (600 mg total) by mouth every 6 (six) hours as needed. (Patient not taking: Reported on 04/08/2017) 30 tablet 0 Completed Course at Unknown time  . lidocaine (LIDODERM) 5 % Place 1 patch onto the skin daily. Remove & Discard patch within 12 hours or as directed by MD (Patient not taking: Reported on 04/08/2017) 30 patch 0 Completed Course at Unknown time  . methocarbamol (ROBAXIN) 500 MG tablet Take 1 tablet (500 mg total) by mouth every 6 (six) hours as needed for muscle spasms. (Patient not taking: Reported on 04/08/2017) 12 tablet 0 Completed Course at Unknown time  . Phenylephrine-DM (THERAFLU COLD/COUGH DAYTIME PO) Take 30 mLs by mouth daily as needed (cold symptoms).   04/08/2017 at Unknown time  . sertraline (ZOLOFT) 100 MG tablet Take 2 tablets (200 mg total) by mouth daily.   Past Month at Unknown time    Musculoskeletal: Strength & Muscle Tone: within normal limits Gait & Station: unable to stand Patient leans: N/A  Psychiatric Specialty Exam: Physical Exam  Full physical performed in Emergency Department. I have reviewed this assessment and concur with its findings.   ROS patient complaining about sore throat secondary to strep and started antibiotic treatment in the emergency department feeling depressed, sad tied unable to participate in group therapy this morning. Patient denied nausea, vomiting, abdomen pain, shortness of breath and chest pain.  No Fever-chills, No Headache, No changes with Vision or hearing, reports vertigo No problems swallowing food or Liquids, No Chest pain, Cough or Shortness of Breath, No Abdominal pain, No Nausea or Vommitting, Bowel movements are regular, No Blood in stool or Urine, No dysuria, No new skin rashes or bruises, No new joints pains-aches,  No new weakness, tingling, numbness in any extremity, No recent weight gain or loss, No polyuria, polydypsia or polyphagia,  A full 10 point Review of Systems was done, except as stated above, all other Review of Systems were negative.  Blood pressure 120/75, pulse (!) 58, temperature 97.9 F (36.6 C), temperature source Oral, resp. rate 16, height 5' 6.14" (1.68 m), weight 103.4 kg (228 lb), SpO2 97 %.Body mass index is 36.64 kg/m.  General Appearance: Disheveled and Guarded  Eye Contact:  Fair  Speech:  Clear and Coherent and Slow  Volume:  Decreased  Mood:  Anxious, Depressed, Hopeless and Worthless  Affect:  Constricted and Depressed  Thought Process:  Coherent and Goal Directed  Orientation:  Full (Time, Place, and Person)  Thought Content:  Rumination  Suicidal Thoughts:  Yes.  with intent/plan  Homicidal Thoughts:  No  Memory:  Immediate;   Good Recent;   Fair Remote;   Fair  Judgement:  Impaired  Insight:  Fair  Psychomotor Activity:  Decreased  Concentration:  Concentration: Fair and Attention Span: Fair  Recall:  [employer] of Knowledge:  Good  Language:  Good  Akathisia:  Negative  Handed:  Right  AIMS (if indicated):     Assets:   Communication Skills Desire for Improvement Leisure Time Resilience Social Support  ADL's:  Intact  Cognition:  WNL  Sleep:  Number of Hours: 6    Treatment Plan Summary: Daily contact with patient to assess and evaluate symptoms and progress in treatment and Medication management  Observation Level/Precautions:  15 minute checks  Laboratory:  Reviewed admission labs including urine drug screen which is positive for multiple drugs including cocaine, opiates, benzos and amphetamines  Psychotherapy:  Group therapy and substance abuse  counseling  Medications:  Restart home medications Zoloft 200 mg daily, trazodone 50 mg at bedtime, Robaxin 500 mg every 8 hours as needed for back pain, gabapentin 300 mg twice daily Bentyl 20 mg every 6 hours when necessary for spasms, and Mucinex DM 1 tablet twice daily for cough and congestion Clonidine protocol for opioid detox treatment   Consultations:   As needed   Discharge Concerns:  Safety   Estimated LOS:5 days   Other:     Physician Treatment Plan for Primary Diagnosis: Major depressive disorder, recurrent severe without psychotic features ([place]) Long Term Goal(s): Improvement in symptoms so as ready for discharge  Short Term Goals: Ability to identify changes in lifestyle to reduce recurrence of condition will improve, Ability to verbalize feelings will improve, Ability to disclose and discuss suicidal ideas and Ability to demonstrate self-control will improve  Physician Treatment Plan for Secondary Diagnosis: Principal Problem:   Major depressive disorder, recurrent severe without psychotic features ([place])  Long Term Goal(s): Improvement in symptoms so as ready for discharge  Short Term Goals: Ability to identify and develop effective coping behaviors will improve, Ability to maintain clinical measurements within normal limits will improve, Compliance with prescribed medications will improve and Ability to identify triggers associated with  substance abuse/mental health issues will improve  I certify that inpatient services furnished can reasonably be expected to improve the patient's condition.    Ambrose Finland, MD 6/16/20181:31 PM

## 2017-04-10 NOTE — [facility] Group Notes (Signed)
[name] Group Notes:  (Nursing/MHT/Case Management/Adjunct)  Date:  04/10/2017  Time:  4:24 PM  Type of Therapy:  Nurse Education  Participation Level:  Did not participate.  Summary of Progress/Problems:  Patients wrote positive attributes on pertaining to their peers, then accepted positive attributes written about them.   Cheri Kearns 04/10/2017, 4:24 PM

## 2017-04-10 NOTE — Progress Notes (Signed)
D: Pt was at nurse's station upon initial approach.  Pt presents with depressed affect and mood.  Her goal is to "get through the night and sleep."  Pt endorses SI with a plan "to cut."  She verbally contracts for safety.  Pt denies HI, denies hallucinations, reports throat pain of 6/10.  Pt has been visible in milieu at times.  She did not attend evening group.      A: Introduced self to pt.  Actively listened to pt and offered support and encouragement. Medications administered per order.  On-site provider contacted for additional orders.  PRN medication administered for pain and sleep.  Proper hand hygiene encouraged.  Q15 minute safety checks maintained.  PO fluids encouraged and provided.  R: Pt is safe on the unit.  Pt is compliant with medications.  Pt verbally contracts for safety.  Will continue to monitor and assess.

## 2017-04-11 LAB — CBC WITH DIFFERENTIAL/PLATELET
BASOS PCT: 1 %
Basophils Absolute: 0 10*3/uL (ref 0.0–0.1)
EOS ABS: 0.1 10*3/uL (ref 0.0–0.7)
EOS PCT: 1 %
HCT: 42.9 % (ref 36.0–46.0)
Hemoglobin: 14.4 g/dL (ref 12.0–15.0)
LYMPHS ABS: 2 10*3/uL (ref 0.7–4.0)
Lymphocytes Relative: 33 %
MCH: 29 pg (ref 26.0–34.0)
MCHC: 33.6 g/dL (ref 30.0–36.0)
MCV: 86.5 fL (ref 78.0–100.0)
Monocytes Absolute: 0.3 10*3/uL (ref 0.1–1.0)
Monocytes Relative: 5 %
NEUTROS PCT: 60 %
Neutro Abs: 3.7 10*3/uL (ref 1.7–7.7)
PLATELETS: 475 10*3/uL — AB (ref 150–400)
RBC: 4.96 MIL/uL (ref 3.87–5.11)
RDW: 13.3 % (ref 11.5–15.5)
WBC: 6.1 10*3/uL (ref 4.0–10.5)

## 2017-04-11 LAB — TSH: TSH: 1.251 u[IU]/mL (ref 0.350–4.500)

## 2017-04-11 MED ORDER — AMOXICILLIN-POT CLAVULANATE 875-125 MG PO TABS
1.0000 | ORAL_TABLET | Freq: Two times a day (BID) | ORAL | Status: AC
  Administered 2017-04-11 – 2017-04-14 (×7): 1 via ORAL
  Filled 2017-04-11 (×10): qty 1

## 2017-04-11 MED ORDER — IBUPROFEN 600 MG PO TABS
600.0000 mg | ORAL_TABLET | Freq: Four times a day (QID) | ORAL | Status: DC | PRN
  Administered 2017-04-11 – 2017-04-15 (×10): 600 mg via ORAL
  Filled 2017-04-11 (×10): qty 1

## 2017-04-11 NOTE — Progress Notes (Signed)
D: Pt was in bed in her room upon initial approach.  Pt presents with depressed affect and mood.  She reports she does not feel well, reports throat pain of 6/10.  Pt denies SI/HI, denies hallucinations.  Her goal is "I just want to get better."   Pt stayed in her room for the majority of the night and did not attend evening group.  She states "hopefully I feel better tomorrow so I can get up and go to breakfast and shower, I've never been this sick."      A: Actively listened to pt and offered support and encouragement. Medications administered per order.  PRN medication administered for pain and nausea.  PO fluids encouraged and provided.  Q15 minute safety checks maintained.  R: Pt is safe on the unit.  Pt is compliant with medications.  Pt verbally contracts for safety.  Will continue to monitor and assess.

## 2017-04-11 NOTE — [facility] Group Notes (Signed)
[place] Group Notes:  (Nursing/MHT/Case Management/Adjunct)  Date:  04/11/2017  Time:  4:44 PM  Type of Therapy:  Nurse Education  Participation Level:  Did Not Attend  Summary of Progress/Problems:  Group was focused on changing negative self talk to positive ans choosing an active, vs passive recovery.   Cheri Kearns 04/11/2017, 4:44 PM

## 2017-04-11 NOTE — Plan of Care (Signed)
Problem: Safety: Goal: Periods of time without injury will increase Outcome: Progressing Pt has not harmed self or others tonight.  She denies SI/HI and verbally contracts for safety.    

## 2017-04-11 NOTE — [facility] Specialist Note (Signed)
CSW attempted PSA x2. Patient refused stating that she did not want to get out of bed because she felt week from her antibiotics and her sore throat.   CSW will continue to follow.  Christene Lye MSW, LCSW

## 2017-04-11 NOTE — Progress Notes (Signed)
Data. Patient denies SI/HI/AVH.   Patient interacting well with staff and other patients.  Action. Emotional support and encouragement offered. Education provided on medication, indications and side effect. Q 15 minute checks done for safety. Response. Safety on the unit maintained through 15 minute checks.  Medications taken as prescribed. Attended groups. Remained calm and appropriate through out shift.

## 2017-04-11 NOTE — Progress Notes (Signed)
[facility] MD Progress Note  04/11/2017 11:16 AM [name]  MRN:  [id number] Subjective: Im so sick I need some help.   Per nursing: t was in bed in her room upon initial approach.  Pt presents with depressed affect and mood.  She reports she does not feel well, reports throat pain of 6/10.  Pt denies SI/HI, denies hallucinations.  Her goal is "I just want to get better."   Pt stayed in her room for the majority of the night and did not attend evening group.  She states "hopefully I feel better tomorrow so I can get up and go to breakfast and shower, I've never been this sick."   Actively listened to pt and offered support and encouragement. Medications administered per order.  PRN medication administered for pain and nausea.  PO fluids encouraged and provided.  Q15 minute safety checks maintained.  Objective: On evaluation the patient reported: Patient states that she feels better mentally however she feels very sick and has never been this sick. She continues to ruminate about her pharyngitis, and feeling sick. She reports swollen glands, sore throat, and denies fever, chills, or sweats from withdrawal symptoms. She reports acute onset of pharyngitis for the past 3 days, prior to admission she was homeless and denies any exposure to infectious disease, crowded conditions, confined living spaces, and recent travel. She has some improved insight at this time reporting she is looking forward to long term treatment at this time, however is homeless and out of work and wants some assistance. She reports this is day 3 of withdraw from heroin and her withdraw symptoms are better but her medical condition is worse.  States that she is sleeping without difficulty and notes some disturbance with eating due to dysphagia. She is tolerating medications without adverse reactions.  Reports that she is unable to attend groups due to being so sick.  At this time patient denies suicidal/self harming thoughts an psychosis.    Principal Problem: Major depressive disorder, recurrent severe without psychotic features ([place]) Diagnosis:   Patient Active Problem List   Diagnosis Date Noted  . MDD (major depressive disorder), recurrent episode, severe ([place]) [F33.2] 04/09/2017  . Polysubstance (including opioids) dependence with physiological dependence ([place]) [F19.20] 04/09/2017  . Major depressive disorder, recurrent severe without psychotic features ([place]) [F33.2] 04/09/2017  . Chronic back pain [M54.9, G89.29] 12/06/2013  . PTSD (post-traumatic stress disorder) [F43.10] 12/06/2013  . Other screening mammogram [Z12.31] 12/06/2013  . ADD (attention deficit disorder) [F98.8] 12/06/2013   Total Time spent with patient: 30 minutes  Past Psychiatric History: Patient has a history of posttraumatic stress disorder, ADD, I MDD, polysubstance dependence and her previous acute psychiatric hospitalization at [facility] was 2014.Pt has past inpt admissions--"I been in the hospital at least 8 times"; [facility](2012,2011,2009), [facility] and [facility]. Pt currently seeking outpatient services with Lattie Haw Poulos([facility], last visit was 12/2012), no therapist.   Past Medical History:  Past Medical History:  Diagnosis Date  . ADD (attention deficit disorder with hyperactivity)   . Bilateral ovarian cysts   . Chlamydia   . Depression   . PID (acute pelvic inflammatory disease)   . PTSD (post-traumatic stress disorder)   . Sciatica     Past Surgical History:  Procedure Laterality Date  . ABDOMINAL HYSTERECTOMY    . BACK SURGERY     dbl lumbar fusion L5-S1  . BLADDER SURGERY    . LAPAROSCOPY    . mesh removel    .  ROTATOR CUFF REPAIR Right 02/15/2013   Family History:  Family History  Problem Relation Age of Onset  . Hypertension Mother   . Heart failure Mother   . Hypertension Father   . Heart failure Father   . Hypertension Sister    Family Psychiatric  History:Patient denied family history of  mental illness. Social History:  History  Alcohol Use No     History  Drug Use    Comment: heroine, molly    Social History   Social History  . Marital status: Divorced    Spouse name: N/A  . Number of children: N/A  . Years of education: N/A   Social History Main Topics  . Smoking status: Current Every Day Smoker    Packs/day: 0.25    Years: 14.00    Types: Cigarettes  . Smokeless tobacco: Current User  . Alcohol use No  . Drug use: Yes     Comment: heroine, molly  . Sexual activity: Yes    Birth control/ protection: Other-see comments     Comment: hysterectomy   Other Topics Concern  . None   Social History Narrative  . None   Additional Social History:       Sleep: Fair  Appetite:  Poor due to pharyngitis  Current Medications: Current Facility-Administered Medications  Medication Dose Route Frequency Provider Last Rate Last Dose  . acetaminophen (TYLENOL) tablet 650 mg  650 mg Oral Q6H PRN Rozetta Nunnery, NP   650 mg at 04/10/17 2003  . amoxicillin-clavulanate (AUGMENTIN) 875-125 MG per tablet 1 tablet  1 tablet Oral Q12H Starkes, Takia S, FNP      . cloNIDine (CATAPRES) tablet 0.1 mg  0.1 mg Oral QID Okonkwo, Justina A, NP   0.1 mg at 04/11/17 1036   Followed by  . [START ON 04/12/2017] cloNIDine (CATAPRES) tablet 0.1 mg  0.1 mg Oral BH-qamhs Okonkwo, Justina A, NP       Followed by  . [START ON 04/14/2017] cloNIDine (CATAPRES) tablet 0.1 mg  0.1 mg Oral QAC breakfast Okonkwo, Justina A, NP      . dextromethorphan-guaiFENesin (MUCINEX DM) 30-600 MG per 12 hr tablet 1 tablet  1 tablet Oral BID PRN [name], NP      . dicyclomine (BENTYL) tablet 20 mg  20 mg Oral Q6H PRN Okonkwo, Justina A, NP      . gabapentin (NEURONTIN) capsule 300 mg  300 mg Oral BID [name], NP   300 mg at 04/11/17 1037  . ibuprofen (ADVIL,MOTRIN) tablet 600 mg  600 mg Oral Q8H PRN [name], NP   600 mg at 04/11/17 1039  . loperamide (IMODIUM) capsule 2-4 mg  2-4 mg  Oral PRN Lu Duffel, Justina A, NP   4 mg at 04/09/17 1754  . magic mouthwash w/lidocaine  15 mL Oral TID PRN [name], NP      . methocarbamol (ROBAXIN) tablet 500 mg  500 mg Oral Q8H PRN Okonkwo, Justina A, NP   500 mg at 04/09/17 1753  . nicotine (NICODERM CQ - dosed in mg/24 hours) patch 21 mg  21 mg Transdermal Daily [name], NP   21 mg at 04/11/17 1037  . ondansetron (ZOFRAN-ODT) disintegrating tablet 4 mg  4 mg Oral Q6H PRN Okonkwo, Justina A, NP   4 mg at 04/10/17 2216  . sertraline (ZOLOFT) tablet 200 mg  200 mg Oral Daily [name], NP   200 mg at 04/11/17 1038  .  traZODone (DESYREL) tablet 50 mg  50 mg Oral QHS,MR X 1 [name] A, NP   50 mg at 04/10/17 2214    Lab Results: No results found for this or any previous visit (from the past 77 hour(s)).  Blood Alcohol level:  Lab Results  Component Value Date   ETH <5 04/08/2017   ETH <11 73/71/0626    Metabolic Disorder Labs: No results found for: HGBA1C, MPG No results found for: PROLACTIN No results found for: CHOL, TRIG, HDL, CHOLHDL, VLDL, LDLCALC  Physical Findings: AIMS: Facial and Oral Movements Muscles of Facial Expression: None, normal Lips and Perioral Area: None, normal Jaw: None, normal Tongue: None, normal,Extremity Movements Upper (arms, wrists, hands, fingers): None, normal Lower (legs, knees, ankles, toes): None, normal, Trunk Movements Neck, shoulders, hips: None, normal, Overall Severity Severity of abnormal movements (highest score from questions above): None, normal Incapacitation due to abnormal movements: None, normal Patient's awareness of abnormal movements (rate only patient's report): No Awareness, Dental Status Current problems with teeth and/or dentures?: No Does patient usually wear dentures?: No  CIWA:    COWS:  COWS Total Score: 5  Musculoskeletal: Strength & Muscle Tone: within normal limits Gait & Station: normal Patient leans: N/A  Psychiatric Specialty  Exam: Physical Exam  Review of Systems  HENT: Positive for sore throat.   Neurological: Positive for weakness.  All other systems reviewed and are negative.   Blood pressure 126/82, pulse 74, temperature 98.1 F (36.7 C), temperature source Oral, resp. rate 16, height 5' 6.14" (1.68 m), weight 103.4 kg (228 lb), SpO2 97 %.Body mass index is 36.64 kg/m.  General Appearance: Fairly Groomed  Eye Contact:  Fair  Speech:  Clear and Coherent and Normal Rate  Volume:  Normal  Mood:  Depressed  Affect:  Depressed and Flat  Thought Process:  Linear and Descriptions of Associations: Circumstantial  Orientation:  Full (Time, Place, and Person)  Thought Content:  Logical  Suicidal Thoughts:  No  Homicidal Thoughts:  No  Memory:  Immediate;   Fair Recent;   Fair Remote;   Fair  Judgement:  Fair  Insight:  Fair and Present  Psychomotor Activity:  Normal  Concentration:  Concentration: Fair and Attention Span: Fair  Recall:  [employer] of Knowledge:  Fair  Language:  Fair  Akathisia:  No  Handed:  Right  AIMS (if indicated):     Assets:  Communication Skills Desire for Improvement Financial Resources/Insurance Leisure Time Physical Health Social Support Transportation Vocational/Educational  ADL's:  Intact  Cognition:  WNL  Sleep:  Number of Hours: 4     Treatment Plan Summary: Daily contact with patient to assess and evaluate symptoms and progress in treatment and Medication management   1 Admit for crisis management and stabilization.  2. Medication management to reduce symptoms to baseline and improved the patient's overall level of functioning. Closely monitor the side effects, efficacy and therapeutic response of medication. Will continue detox protocol at this time.  3. Treat health problem as indicated. Positive -Strep A pharyngitis- Received PCN injection 06/14 continues to report ongoing symptoms of pharyngitis, dysphagia, and swollen glands. On admission had an  elevated WBC and neutrophils, however liver, lymphocytes, and platelet count is normal can exclude mono at this time. Will obtain repeat cbc, if neutrophil and WBC is going down no need to repeat any additional tests. If labs remain elevated may benefit from EBV and mono screening, just due to environmental conditions. No response to PCN at  this time will add AUgmentin 875mg  po BID for 7 days at this time. Will continue to monitor. No internal medicine consult needed or further warranted at this time.  4. Developed treatment plan to decrease the risk of relapse upon discharge and to reduce the need for readmission.  5. Psychosocial education regarding relapse prevention in self-care.  6. Healthcare followup as needed for medical problems and called consults as indicated.  7. Increase collateral information.  8. Restart home medication where appropriate  9. Encouraged to participate and verbalize into group milieu therapy.   Nanci Pina, FNP 04/11/2017, 11:16 AM

## 2017-04-11 NOTE — [facility] Group Notes (Signed)
[place] LCSW Group Therapy  04/11/2017 10 AM  Type of Therapy:  Group Therapy  Participation Level:  Did Not Attend; invited to participate yet did not despite overhead announcement and encouragement by staff    Sheilah Pigeon, LCSW

## 2017-04-11 NOTE — Plan of Care (Signed)
Problem: Activity: Goal: Interest or engagement in activities will improve Outcome: Not Progressing Patient continues to remain in her room for most of the day, including meals, due to feeling unwell with a swollen and sore throat.

## 2017-04-11 NOTE — Progress Notes (Signed)
D   Pt reports feeling a little better this evening  She said she thinks she may be able to go to breakfast in the morning and was eating some ice cream    Pt endorses some depression and anxiety  A   Verbal support given   Medications administered and effectiveness monitored   Q 15 min checks R   Pt is safe at present time

## 2017-04-12 DIAGNOSIS — R451 Restlessness and agitation: Secondary | ICD-10-CM

## 2017-04-12 DIAGNOSIS — G4700 Insomnia, unspecified: Secondary | ICD-10-CM

## 2017-04-12 DIAGNOSIS — F17203 Nicotine dependence unspecified, with withdrawal: Secondary | ICD-10-CM

## 2017-04-12 NOTE — Progress Notes (Signed)
D: Pt presents with flat affect and depressed mood. Pt reports ongoing depression and anxiety today. Pt denies SI/HI. Pt reported feeling awful this morning due to sore throat (strep). Pt offered Motrin, ice chips and ice cream for discomfort. Pt reports decreased withdrawal symptoms but noted to be anxious and irritable on approach. Pt have minimal interaction on the unit this morning. Pt non-compliant with attending morning groups.  A: Medications reviewed with pt. Medications administered as ordered per MD. Verbal support provided. Pt encouraged to attend groups. 15 minute checks performed for safety.  R: Pt receptive to tx.

## 2017-04-12 NOTE — [facility] Group Notes (Signed)
[place] LCSW Group Therapy  04/12/2017 3:41 PM  Type of Therapy:  Group Therapy  Participation Level:  Did Not Attend-pt invited. Chose to remain in bed.   Summary of Progress/Problems: Today's Topic: Overcoming Obstacles. Patients identified one short term goal and potential obstacles in reaching this goal. Patients processed barriers involved in overcoming these obstacles. Patients identified steps necessary for overcoming these obstacles and explored motivation (internal and external) for facing these difficulties head on.   Tiffanye Hartmann N Smart LCSW 04/12/2017, 3:41 PM

## 2017-04-12 NOTE — [facility] Suicide Risk Assessment (Signed)
[place] INPATIENT:  Family/Significant Other Suicide Prevention Education  Suicide Prevention Education:  Patient Refusal for Family/Significant Other Suicide Prevention Education: The patient [name] has refused to provide written consent for family/significant other to be provided Family/Significant Other Suicide Prevention Education during admission and/or prior to discharge.  Physician notified.  [place] Lighter 04/12/2017, 2:40 PM

## 2017-04-12 NOTE — Progress Notes (Signed)
Recreation Therapy Notes  Date: 04/12/17 Time: 0930 Location: 300 Hall Group Room  Group Topic: Stress Management  Goal Area(s) Addresses:  Patient will verbalize importance of using healthy stress management.  Patient will identify positive emotions associated with healthy stress management.   Intervention: Stress Management  Activity :  Guided Imagery.  LRT introduced the stress management technique of guided imagery.  LRT read a script to allow patients to engage in the technique.  Patients were to follow along as the script was read to fully participate.  Education:  Stress Management, Discharge Planning.   Education Outcome: Acknowledges edcuation/In group clarification offered/Needs additional education  Clinical Observations/Feedback: Pt did not attend group.   Victorino Sparrow, LRT/CTRS         Ria Comment, Kylina Vultaggio A 04/12/2017 2:28 PM

## 2017-04-12 NOTE — Tx Team (Signed)
Interdisciplinary Treatment and Diagnostic Plan Update  04/12/2017 Time of Session: [place] MRN: [id number]  Principal Diagnosis: Major depressive disorder, recurrent severe without psychotic features [facility])  Secondary Diagnoses: Principal Problem:   Major depressive disorder, recurrent severe without psychotic features ([name])   Current Medications:  Current Facility-Administered Medications  Medication Dose Route Frequency Provider Last Rate Last Dose  . acetaminophen (TYLENOL) tablet 650 mg  650 mg Oral Q6H PRN Rozetta Nunnery, NP   650 mg at 04/10/17 2003  . amoxicillin-clavulanate (AUGMENTIN) 875-125 MG per tablet 1 tablet  1 tablet Oral Q12H Nanci Pina, FNP   1 tablet at 04/12/17 0824  . cloNIDine (CATAPRES) tablet 0.1 mg  0.1 mg Oral BH-qamhs Okonkwo, Justina A, NP   0.1 mg at 04/12/17 0825   Followed by  . [START ON 04/14/2017] cloNIDine (CATAPRES) tablet 0.1 mg  0.1 mg Oral QAC breakfast Okonkwo, Justina A, NP      . dextromethorphan-guaiFENesin (MUCINEX DM) 30-600 MG per 12 hr tablet 1 tablet  1 tablet Oral BID PRN [name], NP      . dicyclomine (BENTYL) tablet 20 mg  20 mg Oral Q6H PRN Okonkwo, Justina A, NP      . gabapentin (NEURONTIN) capsule 300 mg  300 mg Oral BID [name], NP   300 mg at 04/12/17 0825  . ibuprofen (ADVIL,MOTRIN) tablet 600 mg  600 mg Oral Q6H PRN Nanci Pina, FNP   600 mg at 04/12/17 0825  . loperamide (IMODIUM) capsule 2-4 mg  2-4 mg Oral PRN Lu Duffel, Justina A, NP   4 mg at 04/09/17 1754  . magic mouthwash w/lidocaine  15 mL Oral TID PRN [name], NP      . methocarbamol (ROBAXIN) tablet 500 mg  500 mg Oral Q8H PRN Okonkwo, Justina A, NP   500 mg at 04/09/17 1753  . nicotine (NICODERM CQ - dosed in mg/24 hours) patch 21 mg  21 mg Transdermal Daily [name], NP   21 mg at 04/11/17 1037  . ondansetron (ZOFRAN-ODT) disintegrating tablet 4 mg  4 mg Oral Q6H PRN Okonkwo, Justina A, NP   4 mg at 04/10/17 2216   . sertraline (ZOLOFT) tablet 200 mg  200 mg Oral Daily [name], NP   200 mg at 04/12/17 0825  . traZODone (DESYREL) tablet 50 mg  50 mg Oral QHS,MR X 1 [name] A, NP   50 mg at 04/11/17 2300   PTA Medications: Prescriptions Prior to Admission  Medication Sig Dispense Refill Last Dose  . acetaminophen (TYLENOL) 325 MG tablet Take 650 mg by mouth every 6 (six) hours as needed for moderate pain or headache.   04/08/2017 at Unknown time  . ibuprofen (ADVIL,MOTRIN) 200 MG tablet Take 200 mg by mouth every 6 (six) hours as needed for moderate pain.   04/08/2017 at Unknown time  . ibuprofen (ADVIL,MOTRIN) 600 MG tablet Take 1 tablet (600 mg total) by mouth every 6 (six) hours as needed. (Patient not taking: Reported on 04/08/2017) 30 tablet 0 Completed Course at Unknown time  . lidocaine (LIDODERM) 5 % Place 1 patch onto the skin daily. Remove & Discard patch within 12 hours or as directed by MD (Patient not taking: Reported on 04/08/2017) 30 patch 0 Completed Course at Unknown time  . methocarbamol (ROBAXIN) 500 MG tablet Take 1 tablet (500 mg total) by mouth every 6 (six) hours as needed for muscle spasms. (Patient not taking:  Reported on 04/08/2017) 12 tablet 0 Completed Course at Unknown time  . Phenylephrine-DM (THERAFLU COLD/COUGH DAYTIME PO) Take 30 mLs by mouth daily as needed (cold symptoms).   04/08/2017 at Unknown time  . sertraline (ZOLOFT) 100 MG tablet Take 2 tablets (200 mg total) by mouth daily.   Past Month at Unknown time    Patient Stressors: Financial difficulties Loss of relationship Substance abuse  Patient Strengths: Ability for insight Communication skills Physical Health Supportive family/friends  Treatment Modalities: Medication Management, Group therapy, Case management,  1 to 1 session with clinician, Psychoeducation, Recreational therapy.   Physician Treatment Plan for Primary Diagnosis: Major depressive disorder, recurrent severe without psychotic features  ([place]) Long Term Goal(s): Improvement in symptoms so as ready for discharge Improvement in symptoms so as ready for discharge   Short Term Goals: Ability to identify changes in lifestyle to reduce recurrence of condition will improve Ability to verbalize feelings will improve Ability to disclose and discuss suicidal ideas Ability to demonstrate self-control will improve Ability to identify and develop effective coping behaviors will improve Ability to maintain clinical measurements within normal limits will improve Compliance with prescribed medications will improve Ability to identify triggers associated with substance abuse/mental health issues will improve  Medication Management: Evaluate patient's response, side effects, and tolerance of medication regimen.  Therapeutic Interventions: 1 to 1 sessions, Unit Group sessions and Medication administration.  Evaluation of Outcomes: Progressing  Physician Treatment Plan for Secondary Diagnosis: Principal Problem:   Major depressive disorder, recurrent severe without psychotic features ([place])  Long Term Goal(s): Improvement in symptoms so as ready for discharge Improvement in symptoms so as ready for discharge   Short Term Goals: Ability to identify changes in lifestyle to reduce recurrence of condition will improve Ability to verbalize feelings will improve Ability to disclose and discuss suicidal ideas Ability to demonstrate self-control will improve Ability to identify and develop effective coping behaviors will improve Ability to maintain clinical measurements within normal limits will improve Compliance with prescribed medications will improve Ability to identify triggers associated with substance abuse/mental health issues will improve     Medication Management: Evaluate patient's response, side effects, and tolerance of medication regimen.  Therapeutic Interventions: 1 to 1 sessions, Unit Group sessions and Medication  administration.  Evaluation of Outcomes: Progressing   RN Treatment Plan for Primary Diagnosis: Major depressive disorder, recurrent severe without psychotic features ([place]) Long Term Goal(s): Knowledge of disease and therapeutic regimen to maintain health will improve  Short Term Goals: Ability to remain free from injury will improve, Ability to disclose and discuss suicidal ideas and Ability to identify and develop effective coping behaviors will improve  Medication Management: RN will administer medications as ordered by provider, will assess and evaluate patient's response and provide education to patient for prescribed medication. RN will report any adverse and/or side effects to prescribing provider.  Therapeutic Interventions: 1 on 1 counseling sessions, Psychoeducation, Medication administration, Evaluate responses to treatment, Monitor vital signs and CBGs as ordered, Perform/monitor CIWA, COWS, AIMS and Fall Risk screenings as ordered, Perform wound care treatments as ordered.  Evaluation of Outcomes: Progressing   LCSW Treatment Plan for Primary Diagnosis: Major depressive disorder, recurrent severe without psychotic features ([place]) Long Term Goal(s): Safe transition to appropriate next level of care at discharge, Engage patient in therapeutic group addressing interpersonal concerns.  Short Term Goals: Engage patient in aftercare planning with referrals and resources, Facilitate patient progression through stages of change regarding substance use diagnoses and concerns and Identify triggers  associated with mental health/substance abuse issues  Therapeutic Interventions: Assess for all discharge needs, 1 to 1 time with Social worker, Explore available resources and support systems, Assess for adequacy in community support network, Educate family and significant other(s) on suicide prevention, Complete Psychosocial Assessment, Interpersonal group therapy.  Evaluation of Outcomes:  Progressing   Progress in Treatment: Attending groups: No. New to unit. Continuing to assess.  Participating in groups: No. Taking medication as prescribed: Yes. Toleration medication: Yes. Family/Significant other contact made: SPE completed with pt; pt declined to consent to family contact.  Patient understands diagnosis: Yes. Discussing patient identified problems/goals with staff: Yes. Medical problems stabilized or resolved: Yes. Denies suicidal/homicidal ideation: Yes. Issues/concerns per patient self-inventory: No. Other: n/a  New problem(s) identified: No, Describe:  n/a  New Short Term/Long Term Goal(s) :detox, medication stabilization; development of comprehensive mental wellness/sobriety plan.   Discharge Plan or Barriers: CSW assessing. Pt interested in [facility] and [facility] referrals.   Reason for Continuation of Hospitalization: Anxiety Depression Medication stabilization Withdrawal symptoms  Estimated Length of Stay: 3-5 days   Attendees: Patient: 04/12/2017 8:48 AM  Physician: Dr. Parke Poisson MD 04/12/2017 8:48 AM  Nursing: Jonni Sanger RN 04/12/2017 8:48 AM  RN Care Manager: Lars Pinks CM 04/12/2017 8:48 AM  Social Worker: Maxie Better, LCSW 04/12/2017 8:48 AM  Recreational Therapist: x 04/12/2017 8:48 AM  Other: Lindell Spar NP; Ricky Ala NP 04/12/2017 8:48 AM  Other:  04/12/2017 8:48 AM  Other: 04/12/2017 8:48 AM    Scribe for Treatment Team: [place], LCSW 04/12/2017 8:48 AM

## 2017-04-12 NOTE — Progress Notes (Signed)
[facility] MD Progress Note  04/12/2017 4:06 PM [name]  MRN:  [id number] Subjective: [name] reports, "I have sore throat. I'm not eating well as a result. But, my mood is better".  Objective: On evaluation the patient reported: Patient states that she feels better mentally however she continues the antibiotic therapy for sore throat. She says today that she is homeless and out of work and wants some assistance. States that she is sleeping without difficulty and notes some disturbance with eating due to dysphagia. She is tolerating medications without adverse reactions.  Reports that she is unable to attend groups due to the sore thraot.  At this time patient denies suicidal/self harming thoughts an psychosis.   Principal Problem: Major depressive disorder, recurrent severe without psychotic features ([name]) Diagnosis:   Patient Active Problem List   Diagnosis Date Noted  . MDD (major depressive disorder), recurrent episode, severe ([place]) [F33.2] 04/09/2017  . Polysubstance (including opioids) dependence with physiological dependence ([place]) [F19.20] 04/09/2017  . Major depressive disorder, recurrent severe without psychotic features ([place]) [F33.2] 04/09/2017  . Chronic back pain [M54.9, G89.29] 12/06/2013  . PTSD (post-traumatic stress disorder) [F43.10] 12/06/2013  . Other screening mammogram [Z12.31] 12/06/2013  . ADD (attention deficit disorder) [F98.8] 12/06/2013   Total Time spent with patient: 15 minutes  Past Psychiatric History: Patient has a history of posttraumatic stress disorder, ADD, I MDD, polysubstance dependence and her previous acute psychiatric hospitalization at [facility] was 2014.Pt has past inpt admissions--"I been in the hospital at least 8 times"; [facility](2012,2011,2009), [facility] and [facility]. Pt currently seeking outpatient services with Lattie Haw Poulos([facility], last visit was 12/2012), no therapist.   Past Medical History:  Past Medical History:   Diagnosis Date  . ADD (attention deficit disorder with hyperactivity)   . Bilateral ovarian cysts   . Chlamydia   . Depression   . PID (acute pelvic inflammatory disease)   . PTSD (post-traumatic stress disorder)   . Sciatica     Past Surgical History:  Procedure Laterality Date  . ABDOMINAL HYSTERECTOMY    . BACK SURGERY     dbl lumbar fusion L5-S1  . BLADDER SURGERY    . LAPAROSCOPY    . mesh removel    . ROTATOR CUFF REPAIR Right 02/15/2013   Family History:  Family History  Problem Relation Age of Onset  . Hypertension Mother   . Heart failure Mother   . Hypertension Father   . Heart failure Father   . Hypertension Sister    Family Psychiatric  History: See H&P  Social History:  History  Alcohol Use No     History  Drug Use    Comment: heroine, molly    Social History   Social History  . Marital status: Divorced    Spouse name: N/A  . Number of children: N/A  . Years of education: N/A   Social History Main Topics  . Smoking status: Current Every Day Smoker    Packs/day: 0.25    Years: 14.00    Types: Cigarettes  . Smokeless tobacco: Current User  . Alcohol use No  . Drug use: Yes     Comment: heroine, molly  . Sexual activity: Yes    Birth control/ protection: Other-see comments     Comment: hysterectomy   Other Topics Concern  . None   Social History Narrative  . None   Additional Social History:       Sleep: Fair  Appetite:  Poor due to pharyngitis  Current Medications: Current Facility-Administered Medications  Medication Dose Route Frequency Provider Last Rate Last Dose  . acetaminophen (TYLENOL) tablet 650 mg  650 mg Oral Q6H PRN Rozetta Nunnery, NP   650 mg at 04/10/17 2003  . amoxicillin-clavulanate (AUGMENTIN) 875-125 MG per tablet 1 tablet  1 tablet Oral Q12H Nanci Pina, FNP   1 tablet at 04/12/17 0824  . cloNIDine (CATAPRES) tablet 0.1 mg  0.1 mg Oral BH-qamhs Okonkwo, Justina A, NP   0.1 mg at 04/12/17 0825   Followed  by  . [START ON 04/14/2017] cloNIDine (CATAPRES) tablet 0.1 mg  0.1 mg Oral QAC breakfast Okonkwo, Justina A, NP      . dextromethorphan-guaiFENesin (MUCINEX DM) 30-600 MG per 12 hr tablet 1 tablet  1 tablet Oral BID PRN [name], NP      . dicyclomine (BENTYL) tablet 20 mg  20 mg Oral Q6H PRN Okonkwo, Justina A, NP      . gabapentin (NEURONTIN) capsule 300 mg  300 mg Oral BID [name], NP   300 mg at 04/12/17 0825  . ibuprofen (ADVIL,MOTRIN) tablet 600 mg  600 mg Oral Q6H PRN Nanci Pina, FNP   600 mg at 04/12/17 0825  . loperamide (IMODIUM) capsule 2-4 mg  2-4 mg Oral PRN Lu Duffel, Justina A, NP   4 mg at 04/09/17 1754  . magic mouthwash w/lidocaine  15 mL Oral TID PRN [name], NP      . methocarbamol (ROBAXIN) tablet 500 mg  500 mg Oral Q8H PRN Okonkwo, Justina A, NP   500 mg at 04/09/17 1753  . nicotine (NICODERM CQ - dosed in mg/24 hours) patch 21 mg  21 mg Transdermal Daily [name], NP   21 mg at 04/11/17 1037  . ondansetron (ZOFRAN-ODT) disintegrating tablet 4 mg  4 mg Oral Q6H PRN Okonkwo, Justina A, NP   4 mg at 04/10/17 2216  . sertraline (ZOLOFT) tablet 200 mg  200 mg Oral Daily [name], NP   200 mg at 04/12/17 0825  . traZODone (DESYREL) tablet 50 mg  50 mg Oral QHS,MR X 1 [name] A, NP   50 mg at 04/11/17 2300   Lab Results:  Results for orders placed or performed during the hospital encounter of 04/09/17 (from the past 48 hour(s))  CBC with Differential/Platelet     Status: Abnormal   Collection Time: 04/11/17  6:20 PM  Result Value Ref Range   WBC 6.1 4.0 - 10.5 K/uL   RBC 4.96 3.87 - 5.11 MIL/uL   Hemoglobin 14.4 12.0 - 15.0 g/dL   HCT 42.9 36.0 - 46.0 %   MCV 86.5 78.0 - 100.0 fL   MCH 29.0 26.0 - 34.0 pg   MCHC 33.6 30.0 - 36.0 g/dL   RDW 13.3 11.5 - 15.5 %   Platelets 475 (H) 150 - 400 K/uL   Neutrophils Relative % 60 %   Neutro Abs 3.7 1.7 - 7.7 K/uL   Lymphocytes Relative 33 %   Lymphs Abs 2.0 0.7 - 4.0 K/uL    Monocytes Relative 5 %   Monocytes Absolute 0.3 0.1 - 1.0 K/uL   Eosinophils Relative 1 %   Eosinophils Absolute 0.1 0.0 - 0.7 K/uL   Basophils Relative 1 %   Basophils Absolute 0.0 0.0 - 0.1 K/uL    Comment: Performed at [facility], [place] [address], [place], [place] [address]  TSH     Status: None  Collection Time: 04/11/17  6:20 PM  Result Value Ref Range   TSH 1.251 0.350 - 4.500 uIU/mL    Comment: Performed by a 3rd Generation assay with a functional sensitivity of <=0.01 uIU/mL. Performed at [facility], [place] [address] [place], [place] [address]    Blood Alcohol level:  Lab Results  Component Value Date   [facility] <5 04/08/2017   ETH <11 76/73/4193   Metabolic Disorder Labs: No results found for: HGBA1C, MPG No results found for: PROLACTIN No results found for: CHOL, TRIG, HDL, CHOLHDL, VLDL, LDLCALC  Physical Findings: AIMS: Facial and Oral Movements Muscles of Facial Expression: None, normal Lips and Perioral Area: None, normal Jaw: None, normal Tongue: None, normal,Extremity Movements Upper (arms, wrists, hands, fingers): None, normal Lower (legs, knees, ankles, toes): None, normal, Trunk Movements Neck, shoulders, hips: None, normal, Overall Severity Severity of abnormal movements (highest score from questions above): None, normal Incapacitation due to abnormal movements: None, normal Patient's awareness of abnormal movements (rate only patient's report): No Awareness, Dental Status Current problems with teeth and/or dentures?: No Does patient usually wear dentures?: No  CIWA:    COWS:  COWS Total Score: 2  Musculoskeletal: Strength & Muscle Tone: within normal limits Gait & Station: normal Patient leans: N/A  Psychiatric Specialty Exam: Physical Exam  Review of Systems  HENT: Positive for sore throat.   Neurological: Positive for weakness.  Psychiatric/Behavioral: Positive for depression ("Improving") and substance  abuse (Hx. Polysubstance use disorder). Negative for hallucinations, memory loss and suicidal ideas. The patient has insomnia ("Improving"). The patient is not nervous/anxious.   All other systems reviewed and are negative.   Blood pressure 111/84, pulse (!) 57, temperature 98.5 F (36.9 C), temperature source Oral, resp. rate 18, height 5' 6.14" (1.68 m), weight 103.4 kg (228 lb), SpO2 97 %.Body mass index is 36.64 kg/m.  General Appearance: Fairly Groomed  Eye Contact:  Fair  Speech:  Clear and Coherent and Normal Rate  Volume:  Normal  Mood:  Depressed  Affect:  Depressed and Flat  Thought Process:  Linear and Descriptions of Associations: Circumstantial  Orientation:  Full (Time, Place, and Person)  Thought Content:  Logical  Suicidal Thoughts:  No  Homicidal Thoughts:  No  Memory:  Immediate;   Fair Recent;   Fair Remote;   Fair  Judgement:  Fair  Insight:  Fair and Present  Psychomotor Activity:  Normal  Concentration:  Concentration: Fair and Attention Span: Fair  Recall:  [employer] of Knowledge:  Fair  Language:  Fair  Akathisia:  No  Handed:  Right  AIMS (if indicated):     Assets:  Communication Skills Desire for Improvement Social Support  ADL's:  Intact  Cognition:  WNL  Sleep:  Number of Hours: 6.5   Treatment Plan Summary: Patient continues to required drug detox & mood stabilization treatments.. No evidence of psychosis. No evidence of mania. No dangerousness to self or others. She is receiving antibiotic therapy for strep infection.    Psychiatric: Substance Induced Mood Disorder (SUD)  Medical: Will continue monitor for any symptoms & treat as recommended.Marland Kitchen  Psychosocial:  Polysubstance use disorder. Will encourage group counseling attendance & participation.  PLAN: 1.04-12-17: No changes made on the current plan of care, continue current regimen as recommended.  Substance withdrawal symptoms. Continue the opioid detox  protocols.  Agitation: Continue Gabapentin 300 mg bid.  Depression:  Continue Sertraline 200 mg daily.  Insomnia: Continue Trazodone 50 mg Q hs.  Nicotine Withdrawal symptoms: Will continue the nicotine patch 21 mg Q 24 hours.  2. Continue to monitor mood, behavior and interaction with peers  Encarnacion Slates, NP, PMHNP, FNP-BC. 04/12/2017, 4:06 PMPatient ID: [name], female   DOB: Mar 25, 1966, 51 y.o.   MRN: [id number] Agree with NP Progress Note
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
## 2017-04-13 DIAGNOSIS — Z590 Homelessness: Secondary | ICD-10-CM

## 2017-04-13 LAB — HEMOGLOBIN A1C
HEMOGLOBIN A1C: 5.9 % — AB (ref 4.8–5.6)
MEAN PLASMA GLUCOSE: 123 mg/dL

## 2017-04-13 MED ORDER — MAGIC MOUTHWASH W/LIDOCAINE
5.0000 mL | Freq: Three times a day (TID) | ORAL | Status: DC | PRN
  Administered 2017-04-13: 5 mL via ORAL
  Filled 2017-04-13 (×3): qty 5

## 2017-04-13 MED ORDER — TRAZODONE HCL 100 MG PO TABS
100.0000 mg | ORAL_TABLET | Freq: Every evening | ORAL | Status: DC | PRN
  Administered 2017-04-13 – 2017-04-15 (×6): 100 mg via ORAL
  Filled 2017-04-13 (×12): qty 1

## 2017-04-13 NOTE — [facility] Group Notes (Signed)
[place] LCSW Group Therapy  04/13/2017 3:33 PM  Type of Therapy:  Group Therapy  Participation Level:  Did Not Attend-pt invited. Chose to remain in bed.   Summary of Progress/Problems: [facility] Speaker came to talk about his personal journey with substance abuse and addiction. The pt processed ways by which to relate to the speaker. [place] speaker provided handouts and educational information pertaining to groups and services offered by the [facility].   Neiman Roots N Smart LCSW 04/13/2017, 3:33 PM

## 2017-04-13 NOTE — Progress Notes (Signed)
Recreation Therapy Notes  Animal-Assisted Activity (AAA) Program Checklist/Progress Notes Patient Eligibility Criteria Checklist & Daily Group note for Rec TxIntervention  Date: 04/13/2017 Time: 2:50pm Location: 17 hall dayroom   AAA/T Program Assumption of Risk Form signed by Patient/ or Parent Legal Guardian Yes  Patient is free of allergies or sever asthma Yes  Patient reports no fear of animals Yes  Patient reports no history of cruelty to animals Yes  Patient understands his/her participation is voluntary Yes  Patient washes hands before animal contact Yes  Patient washes hands after animal contact Yes  Behavioral Response: Pt did not attend.  Donovan Kail, Recreation Therapy Intern

## 2017-04-13 NOTE — Progress Notes (Signed)
[facility] MD Progress Note  04/13/2017 6:13 PM [name]  MRN:  [id number] Subjective:   51 yo Caucasian female, single, homeless.  Background history of SUD. Self presented to the ER with reports of suicidal thoughts. Has thoughts to cut her throat or jump off the bridge. Had a fight with her boyfriend. UDS was positive for opiates, cocaine, amphetamine and benzodiazepine.   Chart reviewed today. Patient discussed at team  Staff reports she has been isolating self in her room. Sore throat as a limiting factor to participate at groups. No behavioral issues  Seen today. Says she still struggles with sleep at night. Wants to increase Trazodone. No hallucination in any modality. No persecution. No other delusion. No suicidal or homicidal thoughts. She plans to do a thirty day program at [facility] Says she has been accepted for Friday. Plans to stay with her niece once she is sober.  Has a pending jury trial next month. Addiction treatment would help her in the trial.   Principal Problem: Major depressive disorder, recurrent severe without psychotic features ([place]) Diagnosis:   Patient Active Problem List   Diagnosis Date Noted  . MDD (major depressive disorder), recurrent episode, severe ([place]) [F33.2] 04/09/2017  . Polysubstance (including opioids) dependence with physiological dependence ([place]) [F19.20] 04/09/2017  . Major depressive disorder, recurrent severe without psychotic features ([place]) [F33.2] 04/09/2017  . Chronic back pain [M54.9, G89.29] 12/06/2013  . PTSD (post-traumatic stress disorder) [F43.10] 12/06/2013  . Other screening mammogram [Z12.31] 12/06/2013  . ADD (attention deficit disorder) [F98.8] 12/06/2013   Total Time spent with patient: 20 minutes  Past Psychiatric History: As in H&P  Past Medical History:  Past Medical History:  Diagnosis Date  . ADD (attention deficit disorder with hyperactivity)   . Bilateral ovarian cysts   . Chlamydia   . Depression   . PID  (acute pelvic inflammatory disease)   . PTSD (post-traumatic stress disorder)   . Sciatica     Past Surgical History:  Procedure Laterality Date  . ABDOMINAL HYSTERECTOMY    . BACK SURGERY     dbl lumbar fusion L5-S1  . BLADDER SURGERY    . LAPAROSCOPY    . mesh removel    . ROTATOR CUFF REPAIR Right 02/15/2013   Family History:  Family History  Problem Relation Age of Onset  . Hypertension Mother   . Heart failure Mother   . Hypertension Father   . Heart failure Father   . Hypertension Sister    Family Psychiatric  History: As in H&P Social History:  History  Alcohol Use No     History  Drug Use    Comment: heroine, molly    Social History   Social History  . Marital status: Divorced    Spouse name: N/A  . Number of children: N/A  . Years of education: N/A   Social History Main Topics  . Smoking status: Current Every Day Smoker    Packs/day: 0.25    Years: 14.00    Types: Cigarettes  . Smokeless tobacco: Current User  . Alcohol use No  . Drug use: Yes     Comment: heroine, molly  . Sexual activity: Yes    Birth control/ protection: Other-see comments     Comment: hysterectomy   Other Topics Concern  . None   Social History Narrative  . None   Additional Social History:           Sleep: Fair  Appetite:  Poor  Current Medications:  Current Facility-Administered Medications  Medication Dose Route Frequency Provider Last Rate Last Dose  . acetaminophen (TYLENOL) tablet 650 mg  650 mg Oral Q6H PRN [name] A, NP   650 mg at 04/12/17 2111  . amoxicillin-clavulanate (AUGMENTIN) 875-125 MG per tablet 1 tablet  1 tablet Oral Q12H Nanci Pina, FNP   1 tablet at 04/13/17 0849  . cloNIDine (CATAPRES) tablet 0.1 mg  0.1 mg Oral BH-qamhs Okonkwo, Justina A, NP   0.1 mg at 04/13/17 0849   Followed by  . [START ON 04/14/2017] cloNIDine (CATAPRES) tablet 0.1 mg  0.1 mg Oral QAC breakfast Okonkwo, Justina A, NP      . dextromethorphan-guaiFENesin  (MUCINEX DM) 30-600 MG per 12 hr tablet 1 tablet  1 tablet Oral BID PRN [name], NP      . dicyclomine (BENTYL) tablet 20 mg  20 mg Oral Q6H PRN Okonkwo, Justina A, NP      . gabapentin (NEURONTIN) capsule 300 mg  300 mg Oral BID [name], NP   300 mg at 04/13/17 1637  . ibuprofen (ADVIL,MOTRIN) tablet 600 mg  600 mg Oral Q6H PRN Nanci Pina, FNP   600 mg at 04/13/17 1504  . loperamide (IMODIUM) capsule 2-4 mg  2-4 mg Oral PRN Lu Duffel, Justina A, NP   2 mg at 04/13/17 1504  . magic mouthwash w/lidocaine  5 mL Oral TID PRN Cobos, Myer Peer, MD   5 mL at 04/13/17 1638  . methocarbamol (ROBAXIN) tablet 500 mg  500 mg Oral Q8H PRN Okonkwo, Justina A, NP   500 mg at 04/13/17 1504  . nicotine (NICODERM CQ - dosed in mg/24 hours) patch 21 mg  21 mg Transdermal Daily [name], NP   21 mg at 04/13/17 0851  . ondansetron (ZOFRAN-ODT) disintegrating tablet 4 mg  4 mg Oral Q6H PRN Okonkwo, Justina A, NP   4 mg at 04/13/17 0936  . sertraline (ZOLOFT) tablet 200 mg  200 mg Oral Daily [name], NP   200 mg at 04/13/17 0848  . traZODone (DESYREL) tablet 50 mg  50 mg Oral QHS,MR X 1 [name] A, NP   50 mg at 04/12/17 2109    Lab Results:  Results for orders placed or performed during the hospital encounter of 04/09/17 (from the past 48 hour(s))  CBC with Differential/Platelet     Status: Abnormal   Collection Time: 04/11/17  6:20 PM  Result Value Ref Range   WBC 6.1 4.0 - 10.5 K/uL   RBC 4.96 3.87 - 5.11 MIL/uL   Hemoglobin 14.4 12.0 - 15.0 g/dL   HCT 42.9 36.0 - 46.0 %   MCV 86.5 78.0 - 100.0 fL   MCH 29.0 26.0 - 34.0 pg   MCHC 33.6 30.0 - 36.0 g/dL   RDW 13.3 11.5 - 15.5 %   Platelets 475 (H) 150 - 400 K/uL   Neutrophils Relative % 60 %   Neutro Abs 3.7 1.7 - 7.7 K/uL   Lymphocytes Relative 33 %   Lymphs Abs 2.0 0.7 - 4.0 K/uL   Monocytes Relative 5 %   Monocytes Absolute 0.3 0.1 - 1.0 K/uL   Eosinophils Relative 1 %   Eosinophils Absolute 0.1 0.0 - 0.7 K/uL    Basophils Relative 1 %   Basophils Absolute 0.0 0.0 - 0.1 K/uL    Comment: Performed at [facility], [place] [address] [place], [place] [address]  Hemoglobin A1c  Status: Abnormal   Collection Time: 04/11/17  6:20 PM  Result Value Ref Range   Hgb A1c MFr Bld 5.9 (H) 4.8 - 5.6 %    Comment: (NOTE)         Pre-diabetes: 5.7 - 6.4         Diabetes: >6.4         Glycemic control for adults with diabetes: <7.0    Mean Plasma Glucose 123 mg/dL    Comment: (NOTE) Performed At: [facility] [place], [place] [id number] [name] MD JH:[id number] Performed at [facility], [place] [address], [place], [place] [address]   TSH     Status: None   Collection Time: 04/11/17  6:20 PM  Result Value Ref Range   TSH 1.251 0.350 - 4.500 uIU/mL    Comment: Performed by a 3rd Generation assay with a functional sensitivity of <=0.01 uIU/mL. Performed at [facility], [place] [address], [place], [place] [address]     Blood Alcohol level:  Lab Results  Component Value Date   [facility] <5 04/08/2017   ETH <11 26/37/8588    Metabolic Disorder Labs: Lab Results  Component Value Date   HGBA1C 5.9 (H) 04/11/2017   MPG 123 04/11/2017   No results found for: PROLACTIN No results found for: CHOL, TRIG, HDL, CHOLHDL, VLDL, LDLCALC  Physical Findings: AIMS: Facial and Oral Movements Muscles of Facial Expression: None, normal Lips and Perioral Area: None, normal Jaw: None, normal Tongue: None, normal,Extremity Movements Upper (arms, wrists, hands, fingers): None, normal Lower (legs, knees, ankles, toes): None, normal, Trunk Movements Neck, shoulders, hips: None, normal, Overall Severity Severity of abnormal movements (highest score from questions above): None, normal Incapacitation due to abnormal movements: None, normal Patient's awareness of abnormal movements (rate only patient's report): No Awareness, Dental  Status Current problems with teeth and/or dentures?: No Does patient usually wear dentures?: No  CIWA:    COWS:  COWS Total Score: 0  Musculoskeletal: Strength & Muscle Tone: within normal limits Gait & Station: normal Patient leans: N/A  Psychiatric Specialty Exam: Physical Exam  Constitutional: She is oriented to person, place, and time.  HENT:  Head: Normocephalic and atraumatic.  Eyes: Pupils are equal, round, and reactive to light.  Neck: Normal range of motion. Neck supple.  Cardiovascular: Normal rate.   Respiratory: Effort normal.  Musculoskeletal: Normal range of motion.  Neurological: She is alert and oriented to person, place, and time.  Psychiatric:  As above    ROS  Blood pressure 113/61, pulse 61, temperature 98.3 F (36.8 C), resp. rate 18, height 5' 6.14" (1.68 m), weight 103.4 kg (228 lb), SpO2 97 %.Body mass index is 36.64 kg/m.  General Appearance: In bed under the sheets. Withdrawn. Not in acute distress. Not internally stimulated. Good relatedness.   Eye Contact:  Good  Speech:  Clear and Coherent and Normal Rate  Volume:  Normal  Mood:  Feels better  Affect:  Appropriate and Restricted  Thought Process:  Linear  Orientation:  Full (Time, Place, and Person)  Thought Content:  Future oriented.   Suicidal Thoughts:  No  Homicidal Thoughts:  No  Memory:  Immediate;   Good Recent;   Good Remote;   Good  Judgement:  Good  Insight:  Good  Psychomotor Activity:  Decreased  Concentration:  Concentration: Good and Attention Span: Good  Recall:  Good  Fund of Knowledge:  Good  Language:  Good  Akathisia:  Negative  Handed:    AIMS (if indicated):     Assets:  Communication Skills Desire for Improvement  ADL's:  Intact  Cognition:  WNL  Sleep:  Number of Hours: 6.5     Treatment Plan Summary: Patient is coming off psychoactive substances. No complications. No evidence of psychosis. No evidence of mania. No suicidal or homicidal thoughts.  Psychomotor activity is still not optimal. Seems motivated to partake in relapse preventive measure. Needs further evaluation     Psychiatric: SUD Substance induced mood disorder  Medical: Tonsillitis  Psychosocial:  Homelessness Legal issues Recent end of relationship.  PLAN: 1. Increase Trazodone to 100 mg HS 2. Encourage unit groups and activities 3. Monitor mood, behavior and interaction with peers 4. Continue to motivate patient towards getting into rehab 5. SW facilitate aftercare.    Artist Beach, MD 04/13/2017, 6:13 PM

## 2017-04-13 NOTE — Progress Notes (Signed)
D: Pt presents with flat affect and depressed mood. Pt reports decreased depression and anxiety. Pt denies SI.  Pt reports withdrawal symptoms of chills and cravings. Pt stated that she continues to feel awful due to ongoing sore throat from Strep. Pt stated that she plans to attend [facility] or [facility] once she's feeling better.  A: Medications reviewed with pt. Medications administered as ordered per MD. Verbal support provided. Pt encouraged to attend groups as tolerated. 15 minute checks performed for safety. R: Pt receptive to tx.

## 2017-04-13 NOTE — Progress Notes (Signed)
D   Pt still reports pain in her throat but said she is some better   She has not  Been able to participate in the unit activities due to feeling bad and having diarrhea from the antibiotic    She endorses depression and anxiety    She endorses hopeless and helpless  A    Verbal support given   Medications administered and effectiveness monitored    Q 15 min checks    Encouraged pt to participate more in unit activities R   Pt is safe at present and she agreed to try to participate more

## 2017-04-14 NOTE — Progress Notes (Signed)
Pt reports improvement in her throat and her overall feeling of wellness is better   She said she ate her first meal today   She continues to complain of diarrhea and receives medication for same    She still isolates and does not attend groups     Verbal support given   Continue to encourage participation in unit activities   Medications administered and effectiveness monitored     Pt is safe and did verbalize she would try to make a better effort

## 2017-04-14 NOTE — Progress Notes (Signed)
Recreation Therapy Notes  Date: 04/14/17 Time: 0930 Location: 300 Hall Dayroom  Group Topic: Stress Management  Goal Area(s) Addresses:  Patient will verbalize importance of using healthy stress management.  Patient will identify positive emotions associated with healthy stress management.   Intervention: Stress Management  Activity :  Latina Craver Relaxation.  LRT introduced the stress management technique of guided imagery.  LRT read a script to allow patients to follow along and participate in the activity.  Patients were to follow along as the script was read to engage in the activity.   Education:  Stress Management, Discharge Planning.   Education Outcome: Acknowledges edcuation/In group clarification offered/Needs additional education  Clinical Observations/Feedback: Pt did not attend group.   Victorino Sparrow, LRT/CTRS         Victorino Sparrow A 04/14/2017 12:45 PM

## 2017-04-14 NOTE — Progress Notes (Signed)
Patient ID: [name], female   DOB: 1965-11-16, 51 y.o.   MRN: [id number] Patient reports that she had "poor" sleep last night, states that she requested for a sleep aid which was given to her, but was not helpful.  Patient reports a poor appetite; states that she ate 25% of her breakfast, and none of her lunch, but will "try and eat dinner" this evening.  Pt describes her energy level as "low", her concentration level as "good"., and her depression as "2".;  Pt states stayed reclusive in her room for most of the shift, and came into the day room late in the afternoon, and was visible interacting with her peers.  Patient's affect is blunted, pt appears depressed, but expresses optimism about the future, and denies SI/HI/AVH.    Patient mentioned at start of shift that her goal for the day was to attend group, and to "try and stay awake" in order to meet her goal.  However, patient was not able to meet this goal today, as she slept for most of the day, was called to attend group, but did not attend.     Patient complained of restlessness, diarrhea, and generalized body pains of 6/10 at 0850 during medication administration this morning.  Pt was medicated with Clonidine 0.1mg  for Opiate withdrawals, Imodium 2mg  for diarrhea and Motrin 600mg  for generalized body pain respectively.  Patient complained of generalized body pain of 6/10 again at 1537, and was medicated with Motrin 600mg  at that time.  Patient has now reported a diminished generalized body pain of 2/10, and has reported an improvement in opiate withdrawal symptoms; pt reports an improved attention span, resolved diarrhea with no more diarrhea since morning.  Patient denies having any other concerns.  Q15 minute checks in place, will continue to monitor.

## 2017-04-14 NOTE — Progress Notes (Signed)
Patient ID: [name], female   DOB: 1966/08/07, 51 y.o.   MRN: [id number]  Pt currently presents with an anxious affect and behavior. Pt reports to writer that their goal is to "go to [facility] on Friday." Pt states "it gives me a lot of hope." Pt reports a hard time falling asleep with current medication regimen. Reports increased anxiety tonight, requests further withdrawal medication. Pt assessment shows no signs of current withdrawal.   Pt provided with medications per providers orders. Pt's labs and vitals were monitored throughout the night. Pt given a 1:1 about emotional and mental status. Pt supported and encouraged to express concerns and questions. Pt educated on medications and alternative anxiety relief techniques. Pt encouraged to use coping skills during periods of increased anxiety.   Pt's safety ensured with 15 minute and environmental checks. Pt currently denies SI/HI and A/V hallucinations. Pt verbally agrees to seek staff if SI/HI or A/VH occurs and to consult with staff before acting on any harmful thoughts. Will continue POC.

## 2017-04-14 NOTE — Progress Notes (Signed)
[facility] MD Progress Note  04/14/2017 12:17 PM [name]  MRN:  [id number] Subjective:   51 yo Caucasian female, single, homeless.  Background history of SUD. Self presented to the ER with reports of suicidal thoughts. Has thoughts to cut her throat or jump off the bridge. Had a fight with her boyfriend. UDS was positive for opiates, cocaine, amphetamine and benzodiazepine.   Chart reviewed today. Patient discussed at team. She is motivated to engage with relapse preventive measure.  Staff reports is more animated. She is eating more. She reports relief from her throat. She has not expressed any suicidal thoughts. No behavioral issues. She has not shown any evidence of internal stimulation.   Seen today. Reports better energy. Not as tired as she was yesterday. Mood is getting better. No hallucination in any modality. Not internally distressed. No feeling of persecution. In control of her thoughts and actions. Patient hopes to get into [facility] on Friday. She is motivated to work psychologically towards total abstinence.   Principal Problem: Major depressive disorder, recurrent severe without psychotic features ([place]) Diagnosis:   Patient Active Problem List   Diagnosis Date Noted  . MDD (major depressive disorder), recurrent episode, severe ([place]) [F33.2] 04/09/2017  . Polysubstance (including opioids) dependence with physiological dependence ([place]) [F19.20] 04/09/2017  . Major depressive disorder, recurrent severe without psychotic features ([place]) [F33.2] 04/09/2017  . Chronic back pain [M54.9, G89.29] 12/06/2013  . PTSD (post-traumatic stress disorder) [F43.10] 12/06/2013  . Other screening mammogram [Z12.31] 12/06/2013  . ADD (attention deficit disorder) [F98.8] 12/06/2013   Total Time spent with patient: 20 minutes  Past Psychiatric History: As in H&P  Past Medical History:  Past Medical History:  Diagnosis Date  . ADD (attention deficit disorder with hyperactivity)   . Bilateral  ovarian cysts   . Chlamydia   . Depression   . PID (acute pelvic inflammatory disease)   . PTSD (post-traumatic stress disorder)   . Sciatica     Past Surgical History:  Procedure Laterality Date  . ABDOMINAL HYSTERECTOMY    . BACK SURGERY     dbl lumbar fusion L5-S1  . BLADDER SURGERY    . LAPAROSCOPY    . mesh removel    . ROTATOR CUFF REPAIR Right 02/15/2013   Family History:  Family History  Problem Relation Age of Onset  . Hypertension Mother   . Heart failure Mother   . Hypertension Father   . Heart failure Father   . Hypertension Sister    Family Psychiatric  History: As in H&P Social History:  History  Alcohol Use No     History  Drug Use    Comment: heroine, molly    Social History   Social History  . Marital status: Divorced    Spouse name: N/A  . Number of children: N/A  . Years of education: N/A   Social History Main Topics  . Smoking status: Current Every Day Smoker    Packs/day: 0.25    Years: 14.00    Types: Cigarettes  . Smokeless tobacco: Current User  . Alcohol use No  . Drug use: Yes     Comment: heroine, molly  . Sexual activity: Yes    Birth control/ protection: Other-see comments     Comment: hysterectomy   Other Topics Concern  . None   Social History Narrative  . None   Additional Social History:           Sleep: Good  Appetite:  Better  Current  Medications: Current Facility-Administered Medications  Medication Dose Route Frequency Provider Last Rate Last Dose  . acetaminophen (TYLENOL) tablet 650 mg  650 mg Oral Q6H PRN [name] A, NP   650 mg at 04/12/17 2111  . cloNIDine (CATAPRES) tablet 0.1 mg  0.1 mg Oral QAC breakfast Okonkwo, Justina A, NP   0.1 mg at 04/14/17 0851  . dextromethorphan-guaiFENesin (MUCINEX DM) 30-600 MG per 12 hr tablet 1 tablet  1 tablet Oral BID PRN [name], NP      . dicyclomine (BENTYL) tablet 20 mg  20 mg Oral Q6H PRN Okonkwo, Justina A, NP      . gabapentin (NEURONTIN)  capsule 300 mg  300 mg Oral BID [name], NP   300 mg at 04/14/17 0851  . ibuprofen (ADVIL,MOTRIN) tablet 600 mg  600 mg Oral Q6H PRN Nanci Pina, FNP   600 mg at 04/14/17 0850  . loperamide (IMODIUM) capsule 2-4 mg  2-4 mg Oral PRN Lu Duffel, Justina A, NP   2 mg at 04/14/17 0850  . magic mouthwash w/lidocaine  5 mL Oral TID PRN Cobos, Myer Peer, MD   5 mL at 04/13/17 1638  . methocarbamol (ROBAXIN) tablet 500 mg  500 mg Oral Q8H PRN Okonkwo, Justina A, NP   500 mg at 04/13/17 1504  . nicotine (NICODERM CQ - dosed in mg/24 hours) patch 21 mg  21 mg Transdermal Daily [name], NP   21 mg at 04/14/17 0850  . ondansetron (ZOFRAN-ODT) disintegrating tablet 4 mg  4 mg Oral Q6H PRN Okonkwo, Justina A, NP   4 mg at 04/13/17 0936  . sertraline (ZOLOFT) tablet 200 mg  200 mg Oral Daily [name], NP   200 mg at 04/14/17 0850  . traZODone (DESYREL) tablet 100 mg  100 mg Oral QHS,MR X 1 Buzz Axel A, MD   100 mg at 04/13/17 2322    Lab Results:  No results found for this or any previous visit (from the past 48 hour(s)).  Blood Alcohol level:  Lab Results  Component Value Date   ETH <5 04/08/2017   ETH <11 32/99/2426    Metabolic Disorder Labs: Lab Results  Component Value Date   HGBA1C 5.9 (H) 04/11/2017   MPG 123 04/11/2017   No results found for: PROLACTIN No results found for: CHOL, TRIG, HDL, CHOLHDL, VLDL, LDLCALC  Physical Findings: AIMS: Facial and Oral Movements Muscles of Facial Expression: None, normal Lips and Perioral Area: None, normal Jaw: None, normal Tongue: None, normal,Extremity Movements Upper (arms, wrists, hands, fingers): None, normal Lower (legs, knees, ankles, toes): None, normal, Trunk Movements Neck, shoulders, hips: None, normal, Overall Severity Severity of abnormal movements (highest score from questions above): None, normal Incapacitation due to abnormal movements: None, normal Patient's awareness of abnormal movements (rate  only patient's report): No Awareness, Dental Status Current problems with teeth and/or dentures?: No Does patient usually wear dentures?: No  CIWA:    COWS:  COWS Total Score: 9  Musculoskeletal: Strength & Muscle Tone: within normal limits Gait & Station: normal Patient leans: N/A  Psychiatric Specialty Exam: Physical Exam  Constitutional: She is oriented to person, place, and time.  HENT:  Head: Normocephalic and atraumatic.  Eyes: Pupils are equal, round, and reactive to light.  Neck: Normal range of motion. Neck supple.  Cardiovascular: Normal rate.   Respiratory: Effort normal.  Musculoskeletal: Normal range of motion.  Neurological: She is alert and oriented to person, place, and time.  Psychiatric:  As above    ROS  Blood pressure 107/66, pulse 70, temperature 97.9 F (36.6 C), temperature source Oral, resp. rate 18, height 5' 6.14" (1.68 m), weight 103.4 kg (228 lb), SpO2 97 %.Body mass index is 36.64 kg/m.  General Appearance: More animated today. Good eye contact. Related well with team. Appropriate behavior. Not internally distressed.    Eye Contact:  Good  Speech:  Clear and Coherent and Normal Rate  Volume:  Normal  Mood:  Euthymic  Affect:  Mobilizing some positive affect  Thought Process:  Linear  Orientation:  Full (Time, Place, and Person)  Thought Content:  Future oriented.   Suicidal Thoughts:  No  Homicidal Thoughts:  No  Memory:  Immediate;   Good Recent;   Good Remote;   Good  Judgement:  Good  Insight:  Good  Psychomotor Activity:  Decreased  Concentration:  Concentration: Good and Attention Span: Good  Recall:  Good  Fund of Knowledge:  Good  Language:  Good  Akathisia:  Negative  Handed:    AIMS (if indicated):     Assets:  Communication Skills Desire for Improvement  ADL's:  Intact  Cognition:  WNL  Sleep:  Number of Hours: 6.25     Treatment Plan Summary: Patient is coming off psychoactive substances smoothly. Withdrawal and  depression are lifting. She is not expressing any violent thoughts. She is still motivated to get into rehab. She has intake appointment on Friday. Hopeful discharge on Friday.    Psychiatric: SUD Substance induced mood disorder  Medical: Tonsillitis  Psychosocial:  Homelessness Legal issues Recent end of relationship.  PLAN: 1. Continue current regimen 2. Encourage more group activation as she is physically getting better 3. Continue to monitor mood, behavior and interaction with peers 4. Hopeful discharge on [place].  Artist Beach, MD 04/14/2017, 12:17 PMPatient ID: [name], female   DOB: Jun 11, 1966, 51 y.o.   MRN: [id number]

## 2017-04-15 MED ORDER — MAGIC MOUTHWASH W/LIDOCAINE: 5.0000 mL | Freq: Three times a day (TID) | ORAL | 0 refills | Status: DC | PRN

## 2017-04-15 MED ORDER — GABAPENTIN 300 MG PO CAPS: 300.0000 mg | ORAL_CAPSULE | Freq: Two times a day (BID) | ORAL | 0 refills | Status: DC

## 2017-04-15 MED ORDER — TRAZODONE HCL 100 MG PO TABS
100.0000 mg | ORAL_TABLET | Freq: Every day | ORAL | Status: DC
  Filled 2017-04-15: qty 1

## 2017-04-15 MED ORDER — TRAZODONE HCL 100 MG PO TABS: ORAL_TABLET | ORAL | 0 refills | Status: DC

## 2017-04-15 MED ORDER — NICOTINE 21 MG/24HR TD PT24: 21.0000 mg | MEDICATED_PATCH | Freq: Every day | TRANSDERMAL | 0 refills | Status: DC

## 2017-04-15 MED ORDER — SERTRALINE HCL 100 MG PO TABS: 200.0000 mg | ORAL_TABLET | Freq: Every day | ORAL | 0 refills | Status: DC

## 2017-04-15 NOTE — Progress Notes (Signed)
  [facility] Adult Case Management Discharge Plan :  Will you be returning to the same living situation after discharge:  No-[facility]. At discharge, do you have transportation home?: Yes,  taxi voucher in chart. PT MUST DISCHARGE BY 7AM ON FRIDAY 04/16/17 VIA TAXI IN ORDER TO GET TO [facility] [facility] FOR SCREENING. Do you have the ability to pay for your medications: Yes,  mental health  Release of information consent forms completed and submitted to medical records by CSW.  Patient to Follow up at: Follow-up Information    Services, [facility] Follow up on 04/16/2017.   Why:  Walk in at 7:45am for screening and possible admission into treatment program. Please bring Photo ID/proof of [place] residence and medications/prescriptions from the hospital. Thank you.  Contact information: [place] [address] [phone]        [facility] Follow up.   Specialty:  Behavioral Health Why:  Walk in within 7 days after discharge from the hospital/[facility] to be assessed for services including: medication management, therapy, substance abuse counseling. Walk in hours: Mon-Fri 8am-9am. Thank you.  Contact information: [address] [place] [address] [phone]           Next level of care provider has access to [facility] and Suicide Prevention discussed: Yes,  SPE completed with pt; pt declined to consent to family contact. SPI pamphlet and Mobile Crisis information provided.  Have you used any form of tobacco in the last 30 days? (Cigarettes, Smokeless Tobacco, Cigars, and/or Pipes): Yes  Has patient been referred to the Quitline?: Patient refused referral  Patient has been referred for addiction treatment: Yes  [name] N Smart LCSW 04/15/2017, 10:25 AM

## 2017-04-15 NOTE — [facility] Group Notes (Signed)
[place] LCSW Group Therapy  04/15/2017 1:26 PM  Type of Therapy:  Group Therapy  Participation Level:  Did Not Attend-pt invited. Chose to remain in bed.   Summary of Progress/Problems: Today's Topic: Overcoming Obstacles. Patients identified one short term goal and potential obstacles in reaching this goal. Patients processed barriers involved in overcoming these obstacles. Patients identified steps necessary for overcoming these obstacles and explored motivation (internal and external) for facing these difficulties head on.   Keri Tavella N Smart LCSW 04/15/2017, 1:26 PM

## 2017-04-15 NOTE — Plan of Care (Addendum)
Problem: Activity: Goal: Sleeping patterns will improve Outcome: Progressing Per report:  Pt slept 6 hours last night

## 2017-04-15 NOTE — Discharge Summary (Signed)
Physician Discharge Summary Note  Patient:  [name] is an 51 y.o., female MRN:  [id number] DOB:  [birth year] Patient phone:  [phone] (home)  Patient address:   [place] [address],  Total Time spent with patient: Greater than 30 minutes  Date of Admission:  04/09/2017  Date of Discharge: 04-16-17  Reason for Admission: Suicidal thoughts with plans/drug intoxications.  Principal Problem: Major depressive disorder, recurrent severe without psychotic features [facility])  Discharge Diagnoses: Patient Active Problem List   Diagnosis Date Noted  . MDD (major depressive disorder), recurrent episode, severe ([place]) [F33.2] 04/09/2017  . Polysubstance (including opioids) dependence with physiological dependence ([place]) [F19.20] 04/09/2017  . Major depressive disorder, recurrent severe without psychotic features ([place]) [F33.2] 04/09/2017  . Chronic back pain [M54.9, G89.29] 12/06/2013  . PTSD (post-traumatic stress disorder) [F43.10] 12/06/2013  . Other screening mammogram [Z12.31] 12/06/2013  . ADD (attention deficit disorder) [F98.8] 12/06/2013   Past Psychiatric History: Polysubstance use disorder, dependence, MDD, PTSD  Past Medical History:  Past Medical History:  Diagnosis Date  . ADD (attention deficit disorder with hyperactivity)   . Bilateral ovarian cysts   . Chlamydia   . Depression   . PID (acute pelvic inflammatory disease)   . PTSD (post-traumatic stress disorder)   . Sciatica     Past Surgical History:  Procedure Laterality Date  . ABDOMINAL HYSTERECTOMY    . BACK SURGERY     dbl lumbar fusion L5-S1  . BLADDER SURGERY    . LAPAROSCOPY    . mesh removel    . ROTATOR CUFF REPAIR Right 02/15/2013   Family History:  Family History  Problem Relation Age of Onset  . Hypertension Mother   . Heart failure Mother   . Hypertension Father   . Heart failure Father   . Hypertension Sister    Family Psychiatric  History: See H&P  Social  History:  History  Alcohol Use No     History  Drug Use    Comment: heroine, molly    Social History   Social History  . Marital status: Divorced    Spouse name: N/A  . Number of children: N/A  . Years of education: N/A   Social History Main Topics  . Smoking status: Current Every Day Smoker    Packs/day: 0.25    Years: 14.00    Types: Cigarettes  . Smokeless tobacco: Current User  . Alcohol use No  . Drug use: Yes     Comment: heroine, molly  . Sexual activity: Yes    Birth control/ protection: Other-see comments     Comment: hysterectomy   Other Topics Concern  . None   Social History Narrative  . None   Hospital Course: (Per Md's SRA): 51 yo Caucasian female, single, homeless. Background history of SUD. Self presented to the ER with reports of suicidal thoughts. Has thoughts to cut her throat or jump off the bridge. Had a fight with her boyfriend. UDS was positive for opiates, cocaine, amphetamine and benzodiazepine.  While a patient is this hospital, Joelene Millin received clonidine detoxification treatment protocols to re-stabilize her systems of Opioid intoxications. Her UDS upon admission was positive for ; Amphetamine, Benzodiazepine, Opioid & Cocaine. She presented to the hospital drug intoxicated & depressed, requiring detoxification & mood stabilization treatments. She received Clonidine detox protocols. She was also enrolled in group the counseling sessions being offered & held on this unit to learn coping skills that should help her after discharge to cope  better and manage hher substance abuse issues to maintain sobriety.   Oveda also received other medication regimen for her depression. She received & was discharged on; Sertraline 200 mg for depression, Nicotine patch 21 mg for smoking cessation, Gabapentin 300 mg for agitation/substance withdrawal syndrome & Trazodone 100 mg for insomnia. Mionna presented other health issues that required treatment. She was  treated & stabilized as well. She tolerated her treatment regimen without any significant adverse effects and or reactions reported.   Mcken[place] has successfully completed her detoxifcation treatments. She is currently mentally & medically stable to be discharged to continue further substance abuse treatment & routine mental health care on an outpatient basis. She is provided with all the necessary information needed to make these appointments without problems.   Upon discharge, Lisaanne adamantly denies any suicidal, homicidal ideations, delusional thought, auditory, visual hallucinations and or substance withdrawal symptoms. She received 14 days worth supply sample of her [facility] discharge medications. Transportation per taxi. She left [facility] in no apparent distress with all personal belongings. [place] assisted with taxi voucher.  Physical Findings: AIMS: Facial and Oral Movements Muscles of Facial Expression: None, normal Lips and Perioral Area: None, normal Jaw: None, normal Tongue: None, normal,Extremity Movements Upper (arms, wrists, hands, fingers): None, normal Lower (legs, knees, ankles, toes): None, normal, Trunk Movements Neck, shoulders, hips: None, normal, Overall Severity Severity of abnormal movements (highest score from questions above): None, normal Incapacitation due to abnormal movements: None, normal Patient's awareness of abnormal movements (rate only patient's report): No Awareness, Dental Status Current problems with teeth and/or dentures?: No Does patient usually wear dentures?: No  CIWA:    COWS:  COWS Total Score: 1  Musculoskeletal: Strength & Muscle Tone: within normal limits Gait & Station: normal Patient leans: N/A  Psychiatric Specialty Exam: Physical Exam  Constitutional: She appears well-developed.  HENT:  Head: Normocephalic.  Eyes: Pupils are equal, round, and reactive to light.  Neck: Normal range of motion.  Cardiovascular: Normal rate.   Respiratory:  Effort normal.  GI: Soft.  Genitourinary:  Genitourinary Comments: Deferred  Musculoskeletal: Normal range of motion.  Neurological: She is alert.  Skin: Skin is warm.    Review of Systems  Constitutional: Negative.   HENT: Negative.   Eyes: Negative.   Respiratory: Negative.   Cardiovascular: Negative.   Gastrointestinal: Negative.   Genitourinary: Negative.   Musculoskeletal: Negative.   Skin: Negative.   Neurological: Negative.   Psychiatric/Behavioral: Positive for depression and substance abuse (Hx. polysubstance dependence). Negative for hallucinations, memory loss and suicidal ideas. The patient has insomnia. The patient is not nervous/anxious (Stable).     Blood pressure 128/70, pulse 68, temperature 97.9 F (36.6 C), temperature source Oral, resp. rate 20, height 5' 6.14" (1.68 m), weight 103.4 kg (228 lb), SpO2 97 %.Body mass index is 36.64 kg/m.  See Md's SRA   Have you used any form of tobacco in the last 30 days? (Cigarettes, Smokeless Tobacco, Cigars, and/or Pipes): Yes  Has this patient used any form of tobacco in the last 30 days? (Cigarettes, Smokeless Tobacco, Cigars, and/or Pipes): Yes, provided with a nicotine patch prescription for smoking cessations.  Blood Alcohol level:  Lab Results  Component Value Date   [facility] <5 04/08/2017   ETH <11 49/70/2637   Metabolic Disorder Labs:  Lab Results  Component Value Date   HGBA1C 5.9 (H) 04/11/2017   MPG 123 04/11/2017   No results found for: PROLACTIN No results found for: CHOL, TRIG, HDL, CHOLHDL, VLDL, LDLCALC  See Psychiatric Specialty Exam and Suicide Risk Assessment completed by Attending Physician prior to discharge.  Discharge destination:  [facility]  Is patient on multiple antipsychotic therapies at discharge:  No   Has Patient had three or more failed trials of antipsychotic monotherapy by history:  No  Recommended Plan for Multiple Antipsychotic Therapies: NA  Allergies as of  04/15/2017      Reactions   Imitrex [sumatriptan Base] Shortness Of Breath   Clarithromycin Nausea And Vomiting   Clindamycin/lincomycin Diarrhea   Violently sick-projectile vomiting   Morphine And Related Nausea And Vomiting   Vicodin [hydrocodone-acetaminophen] Nausea And Vomiting      Medication List    STOP taking these medications   acetaminophen 325 MG tablet Commonly known as:  TYLENOL   ibuprofen 200 MG tablet Commonly known as:  ADVIL,MOTRIN   ibuprofen 600 MG tablet Commonly known as:  ADVIL,MOTRIN   lidocaine 5 % Commonly known as:  LIDODERM   methocarbamol 500 MG tablet Commonly known as:  ROBAXIN   THERAFLU COLD/COUGH DAYTIME PO     TAKE these medications     Indication  gabapentin 300 MG capsule Commonly known as:  NEURONTIN Take 1 capsule (300 mg total) by mouth 2 (two) times daily. For agitation  Indication:  Agitation   magic mouthwash w/lidocaine Soln Take 5 mLs by mouth 3 (three) times daily as needed for mouth pain.  Indication:  Oral pain/thrush   nicotine 21 mg/24hr patch Commonly known as:  NICODERM CQ - dosed in mg/24 hours Place 1 patch (21 mg total) onto the skin daily. For smoking cessation Start taking on:  04/16/2017  Indication:  Nicotine Addiction   sertraline 100 MG tablet Commonly known as:  ZOLOFT Take 2 tablets (200 mg total) by mouth daily. For depression Start taking on:  04/16/2017 What changed:  additional instructions  Indication:  Major Depressive Disorder   traZODone 100 MG tablet Commonly known as:  DESYREL Take 1 tablet (50 mg) at bedtime: For sleep  Indication:  Trouble Sleeping      Follow-up Information    [facility] Follow up on 04/16/2017.   Why:  Walk in at 7:45am for screening and possible admission into treatment program. Please bring Photo ID/proof of [place] residence and medications/prescriptions from the hospital. Thank you.  Contact information: [place] [address] [phone]        [facility] Follow up.   Specialty:  Behavioral Health Why:  Walk in within 7 days after discharge from the hospital/[facility] to be assessed for services including: medication management, therapy, substance abuse counseling. Walk in hours: Mon-Fri 8am-9am. Thank you.  Contact information: [place] [address] [phone]          Follow-up recommendations: Activity:  As tolerated Diet: As recommended by your primary care doctor. Keep all scheduled follow-up appointments as recommended.   Comments: Patient is instructed prior to discharge to: Take all medications as prescribed by his/her mental healthcare provider. Report any adverse effects and or reactions from the medicines to his/her outpatient provider promptly. Patient has been instructed & cautioned: To not engage in alcohol and or illegal drug use while on prescription medicines. In the event of worsening symptoms, patient is instructed to call the crisis hotline, 911 and or go to the nearest ED for appropriate evaluation and treatment of symptoms. To follow-up with his/her primary care provider for your other medical issues, concerns and or health care  needs.   Signed: Encarnacion Slates, NP, , PMHNP, FNP-BC 04/15/2017, 11:25 AM

## 2017-04-15 NOTE — Tx Team (Signed)
Interdisciplinary Treatment and Diagnostic Plan Update  04/15/2017 Time of Session: 0930 SHANTIKA BERMEA MRN: [id number]  Principal Diagnosis: Major depressive disorder, recurrent severe without psychotic features [facility])  Secondary Diagnoses: Principal Problem:   Major depressive disorder, recurrent severe without psychotic features (HCC)   Current Medications:  Current Facility-Administered Medications  Medication Dose Route Frequency Provider Last Rate Last Dose  . acetaminophen (TYLENOL) tablet 650 mg  650 mg Oral Q6H PRN Nira Conn A, NP   650 mg at 04/14/17 2105  . dextromethorphan-guaiFENesin (MUCINEX DM) 30-600 MG per 12 hr tablet 1 tablet  1 tablet Oral BID PRN Charm Rings, NP      . gabapentin (NEURONTIN) capsule 300 mg  300 mg Oral BID Charm Rings, NP   300 mg at 04/15/17 0900  . ibuprofen (ADVIL,MOTRIN) tablet 600 mg  600 mg Oral Q6H PRN Truman Hayward, FNP   600 mg at 04/14/17 2149  . magic mouthwash w/lidocaine  5 mL Oral TID PRN Cobos, Rockey Situ, MD   5 mL at 04/13/17 1638  . nicotine (NICODERM CQ - dosed in mg/24 hours) patch 21 mg  21 mg Transdermal Daily Charm Rings, NP   21 mg at 04/15/17 0900  . sertraline (ZOLOFT) tablet 200 mg  200 mg Oral Daily Charm Rings, NP   200 mg at 04/15/17 0900  . traZODone (DESYREL) tablet 100 mg  100 mg Oral QHS,MR X 1 Izediuno, Vincent A, MD   100 mg at 04/14/17 2149   PTA Medications: Prescriptions Prior to Admission  Medication Sig Dispense Refill Last Dose  . acetaminophen (TYLENOL) 325 MG tablet Take 650 mg by mouth every 6 (six) hours as needed for moderate pain or headache.   04/08/2017 at Unknown time  . ibuprofen (ADVIL,MOTRIN) 200 MG tablet Take 200 mg by mouth every 6 (six) hours as needed for moderate pain.   04/08/2017 at Unknown time  . ibuprofen (ADVIL,MOTRIN) 600 MG tablet Take 1 tablet (600 mg total) by mouth every 6 (six) hours as needed. (Patient not taking: Reported on 04/08/2017) 30 tablet 0  Completed Course at Unknown time  . lidocaine (LIDODERM) 5 % Place 1 patch onto the skin daily. Remove & Discard patch within 12 hours or as directed by MD (Patient not taking: Reported on 04/08/2017) 30 patch 0 Completed Course at Unknown time  . methocarbamol (ROBAXIN) 500 MG tablet Take 1 tablet (500 mg total) by mouth every 6 (six) hours as needed for muscle spasms. (Patient not taking: Reported on 04/08/2017) 12 tablet 0 Completed Course at Unknown time  . Phenylephrine-DM (THERAFLU COLD/COUGH DAYTIME PO) Take 30 mLs by mouth daily as needed (cold symptoms).   04/08/2017 at Unknown time  . sertraline (ZOLOFT) 100 MG tablet Take 2 tablets (200 mg total) by mouth daily.   Past Month at Unknown time    Patient Stressors: Financial difficulties Loss of relationship Substance abuse  Patient Strengths: Ability for insight Communication skills Physical Health Supportive family/friends  Treatment Modalities: Medication Management, Group therapy, Case management,  1 to 1 session with clinician, Psychoeducation, Recreational therapy.   Physician Treatment Plan for Primary Diagnosis: Major depressive disorder, recurrent severe without psychotic features (HCC) Long Term Goal(s): Improvement in symptoms so as ready for discharge Improvement in symptoms so as ready for discharge   Short Term Goals: Ability to identify changes in lifestyle to reduce recurrence of condition will improve Ability to verbalize feelings will improve Ability to disclose and discuss suicidal ideas  Ability to demonstrate self-control will improve Ability to identify and develop effective coping behaviors will improve Ability to maintain clinical measurements within normal limits will improve Compliance with prescribed medications will improve Ability to identify triggers associated with substance abuse/mental health issues will improve  Medication Management: Evaluate patient's response, side effects, and tolerance of  medication regimen.  Therapeutic Interventions: 1 to 1 sessions, Unit Group sessions and Medication administration.  Evaluation of Outcomes: Met  Physician Treatment Plan for Secondary Diagnosis: Principal Problem:   Major depressive disorder, recurrent severe without psychotic features ([place])  Long Term Goal(s): Improvement in symptoms so as ready for discharge Improvement in symptoms so as ready for discharge   Short Term Goals: Ability to identify changes in lifestyle to reduce recurrence of condition will improve Ability to verbalize feelings will improve Ability to disclose and discuss suicidal ideas Ability to demonstrate self-control will improve Ability to identify and develop effective coping behaviors will improve Ability to maintain clinical measurements within normal limits will improve Compliance with prescribed medications will improve Ability to identify triggers associated with substance abuse/mental health issues will improve     Medication Management: Evaluate patient's response, side effects, and tolerance of medication regimen.  Therapeutic Interventions: 1 to 1 sessions, Unit Group sessions and Medication administration.  Evaluation of Outcomes: Met   RN Treatment Plan for Primary Diagnosis: Major depressive disorder, recurrent severe without psychotic features ([place]) Long Term Goal(s): Knowledge of disease and therapeutic regimen to maintain health will improve  Short Term Goals: Ability to remain free from injury will improve, Ability to disclose and discuss suicidal ideas and Ability to identify and develop effective coping behaviors will improve  Medication Management: RN will administer medications as ordered by provider, will assess and evaluate patient's response and provide education to patient for prescribed medication. RN will report any adverse and/or side effects to prescribing provider.  Therapeutic Interventions: 1 on 1 counseling sessions,  Psychoeducation, Medication administration, Evaluate responses to treatment, Monitor vital signs and CBGs as ordered, Perform/monitor CIWA, COWS, AIMS and Fall Risk screenings as ordered, Perform wound care treatments as ordered.  Evaluation of Outcomes: Met   LCSW Treatment Plan for Primary Diagnosis: Major depressive disorder, recurrent severe without psychotic features ([place]) Long Term Goal(s): Safe transition to appropriate next level of care at discharge, Engage patient in therapeutic group addressing interpersonal concerns.  Short Term Goals: Engage patient in aftercare planning with referrals and resources, Facilitate patient progression through stages of change regarding substance use diagnoses and concerns and Identify triggers associated with mental health/substance abuse issues  Therapeutic Interventions: Assess for all discharge needs, 1 to 1 time with Social worker, Explore available resources and support systems, Assess for adequacy in community support network, Educate family and significant other(s) on suicide prevention, Complete Psychosocial Assessment, Interpersonal group therapy.  Evaluation of Outcomes: Met   Progress in Treatment: Attending groups: No. Pt continues to stay in bed to rest. Recent strep throat.  Participating in groups: No. Taking medication as prescribed: Yes. Toleration medication: Yes. Family/Significant other contact made: SPE completed with pt; pt declined to consent to family contact.  Patient understands diagnosis: Yes. Discussing patient identified problems/goals with staff: Yes. Medical problems stabilized or resolved: Yes. Denies suicidal/homicidal ideation: Yes. Issues/concerns per patient self-inventory: No. Other: n/a  New problem(s) identified: No, Describe:  n/a  New Short Term/Long Term Goal(s) :detox, medication stabilization; development of comprehensive mental wellness/sobriety plan.   Discharge Plan or Barriers: Pt plans to  discharge directly to [facility].  [facility] for outpatient services. Pt also provided with [place] pamphlet and AA/NA information for [facility].   Reason for Continuation of Hospitalization: medication management   Estimated Length of Stay: 1 day (pt scheduled for 7am discharge via taxi to [facility] on 04/16/17 Ludwig Clarks)  Attendees: Patient: 04/15/2017 10:30 AM  Physician: Dr. Sanjuana Letters MD 04/15/2017 10:30 AM  Nursing: Shaune Pollack, Patrice RN 04/15/2017 10:30 AM  RN Care Manager: Lars Pinks CM 04/15/2017 10:30 AM  Social Worker: Maxie Better, LCSW 04/15/2017 10:30 AM  Recreational Therapist: x 04/15/2017 10:30 AM  Other: Lindell Spar NP 04/15/2017 10:30 AM  Other:  04/15/2017 10:30 AM  Other: 04/15/2017 10:30 AM    Scribe for Treatment Team: [place], LCSW 04/15/2017 10:30 AM

## 2017-04-15 NOTE — [facility]HH Suicide Risk Assessment (Signed)
[facility] Discharge Suicide Risk Assessment   Principal Problem: Major depressive disorder, recurrent severe without psychotic features [facility]) Discharge Diagnoses:  Patient Active Problem List   Diagnosis Date Noted  . MDD (major depressive disorder), recurrent episode, severe ([place]) [F33.2] 04/09/2017  . Polysubstance (including opioids) dependence with physiological dependence ([place]) [F19.20] 04/09/2017  . Major depressive disorder, recurrent severe without psychotic features ([place]) [F33.2] 04/09/2017  . Chronic back pain [M54.9, G89.29] 12/06/2013  . PTSD (post-traumatic stress disorder) [F43.10] 12/06/2013  . Other screening mammogram [Z12.31] 12/06/2013  . ADD (attention deficit disorder) [F98.8] 12/06/2013    Total Time spent with patient: 45 minutes  Musculoskeletal: Strength & Muscle Tone: within normal limits Gait & Station: normal Patient leans: N/A  Psychiatric Specialty Exam: Review of Systems  Constitutional: Negative.   HENT: Negative.   Eyes: Negative.   Respiratory: Negative.   Cardiovascular: Negative.   Gastrointestinal: Negative.   Genitourinary: Negative.   Musculoskeletal: Negative.   Skin: Negative.   Neurological: Negative.   Endo/Heme/Allergies: Negative.   Psychiatric/Behavioral: Negative for depression, hallucinations, memory loss, substance abuse and suicidal ideas. The patient is not nervous/anxious and does not have insomnia.     Blood pressure 128/70, pulse 68, temperature 97.9 F (36.6 C), temperature source Oral, resp. rate 20, height 5' 6.14" (1.68 m), weight 103.4 kg (228 lb), SpO2 97 %.Body mass index is 36.64 kg/m.  General Appearance: In bed, calm and cooperative. Appropriate behavior. Not in any distress. Good relatedness. Not internally stimulated  Eye Contact::  Good  Speech:  Spontaneous, normal prosody. Normal tone and rate.   Volume:  Normal  Mood:  Euthymic  Affect:  Appropriate and Restricted  Thought Process:  Linear  Orientation:   Full (Time, Place, and Person)  Thought Content:  Future oriented. No delusional theme. No preoccupation with violent thoughts. No negative ruminations. No obsession.  No hallucination in any modality.   Suicidal Thoughts:  No  Homicidal Thoughts:  No  Memory:  Immediate;   Good Recent;   Good Remote;   Good  Judgement:  Good  Insight:  Good  Psychomotor Activity:  Decreased  Concentration:  Good  Recall:  Good  Fund of Knowledge:Good  Language: Good  Akathisia:  Negative  Handed:    AIMS (if indicated):     Assets:  Desire for Improvement Financial Resources/Insurance Physical Health Resilience Social Support  Sleep:  Number of Hours: 6.5  Cognition: WNL  ADL's:  Intact   Clinical Assessment::   51 yo Caucasian female, single, homeless.  Background history of SUD. Self presented to the ER with reports of suicidal thoughts. Has thoughts to cut her throat or jump off the bridge. Had a fight with her boyfriend. UDS was positive for opiates, cocaine, amphetamine and benzodiazepine.  Seen today. In good spirits. Energy is getting better. No longer in active withdrawals. No evidence of psychosis. No evidence of mania. Not pervasively down. Focused on recovery. Remains committed to rehab. No suicidal or homicidal thoughts. No thoughts of violence.  Staff reports that she has been appropriate. She is has minimal interaction with peers. No behavioral issues. Has not been observed to be internally stimulated.  Patient was discussed at team. Team members feels that patient is back to her baseline level of function. Team agrees with plan to discharge patient today.   Demographic Factors:  NA  Loss Factors: NA  Historical Factors: Impulsivity  Risk Reduction Factors:   Living with another person, especially a relative, Positive social support, Positive therapeutic  relationship and Positive coping skills or problem solving skills  Continued Clinical Symptoms:   as above    Cognitive Features That Contribute To Risk:  None    Suicide Risk:  Minimal: No identifiable suicidal ideation.  Patient is not having any thoughts of suicide at this time. Modifiable risk factors targeted during this admission includes depression and substance use. Demographical and historical risk factors cannot be modified. Patient is now engaging well. Patient is reliable and is future oriented. We have buffered patient's support structures. At this point, patient is at low risk of suicide. Patient is aware of the effects of psychoactive substances on decision making process. Patient has been provided with emergency contacts. Patient acknowledges to use resources provided if unforseen circumstances changes their current risk stratification.    Follow-up Information    Services, [facility] Follow up on 04/16/2017.   Why:  Walk in at 7:45am for screening and possible admission into treatment program. Please bring Photo ID/proof of [place] residence and medications/prescriptions from the hospital. Thank you.  Contact information: [place] [address] [phone]        [facility] Follow up.   Specialty:  Behavioral Health Why:  Walk in within 7 days after discharge from the hospital/[facility] to be assessed for services including: medication management, therapy, substance abuse counseling. Walk in hours: Mon-Fri 8am-9am. Thank you.  Contact information: [place] [address] [phone]           Plan Of Care/Follow-up recommendations:  1. Continue current psychotropic medications 2. Mental health and addiction follow up as arranged.  3. Provided limited quantity of prescriptions  [place], MD 04/15/2017, 4:21 PM

## 2017-04-15 NOTE — Progress Notes (Signed)
D: Pt presents with flat affect and depressed mood. Pt denies depression but is noted to be withdrawn and isolative to her room. Pt non compliant with attending meals and groups today. Pt rates anxiety 4/10. Pt denies withdrawal symptoms. Pt verbalized that she plans to attend [facility] on Friday for long-term tx. A: Medications reviewed with pt. Medications administered as ordered per MD. Verbal support provided . Pt encouraged to attend groups. 15 minute checks performed for safety. R: Pt receptive to tx.

## 2017-04-15 NOTE — Progress Notes (Signed)
Patient ID: [name], female   DOB: 06/21/1966, 51 y.o.   MRN: [id number]  Pt reports to writer that she did not sleep well last night. Sleep documentation shows she slept 6.5 hours. Pt reports increased anxiety. Encouraged pt to speak with MD about sleep medications and increasing anxiety. Will pass on to oncoming RN.

## 2017-04-16 NOTE — Progress Notes (Signed)
Patient ID: [name], female   DOB: 01-04-1966, 51 y.o.   MRN: [id number]  Pt currently presents with a worried affect and anxious behavior. Pt reports to Probation officer that their goal is to "go to [facility] tomorrow." Pt approaches MHT and reports, tearfully, that her boyfriend has told her that he no longer wants to be in a relationship. Pt reports anger and sadness. Pt is hopeful about going to [facility] tomorrow. Pt reports poor sleep with current medication regimen, denies any relationship to anxiety.   Pt provided with medications per providers orders. Pt's labs and vitals were monitored throughout the night. Pt given a 1:1 about emotional and mental status. Pt supported and encouraged to express concerns and questions. Pt educated on medications and assertiveness techniques.   Pt's safety ensured with 15 minute and environmental checks. Pt currently denies SI/HI and A/V hallucinations. Pt verbally agrees to seek staff if SI/HI or A/VH occurs and to consult with staff before acting on any harmful thoughts. Will continue POC.

## 2017-04-16 NOTE — Progress Notes (Signed)
Patient ID: [name], female   DOB: May 21, 1966, 51 y.o.   MRN: [id number]  Pt discharged to lobby, transportation arranged. Pt was stable and appreciative at that time. All papers and prescriptions were given and valuables returned. Meal tray given to patient. Verbal understanding expressed. Denies SI/HI and A/VH. Pt given opportunity to express concerns and ask questions.

## 2017-05-21 ENCOUNTER — Encounter (HOSPITAL_COMMUNITY): Payer: Self-pay | Admitting: Emergency Medicine

## 2017-05-21 ENCOUNTER — Emergency Department (HOSPITAL_COMMUNITY): Payer: No Typology Code available for payment source

## 2017-05-21 ENCOUNTER — Observation Stay (HOSPITAL_COMMUNITY)
Admission: EM | Admit: 2017-05-21 | Discharge: 2017-05-22 | Disposition: A | Discharge status: Home / Self Care | Payer: No Typology Code available for payment source | Attending: Family Medicine | Admitting: Family Medicine

## 2017-05-21 DIAGNOSIS — R7989 Other specified abnormal findings of blood chemistry: Secondary | ICD-10-CM | POA: Diagnosis present

## 2017-05-21 DIAGNOSIS — F1920 Other psychoactive substance dependence, uncomplicated: Secondary | ICD-10-CM

## 2017-05-21 DIAGNOSIS — F332 Major depressive disorder, recurrent severe without psychotic features: Secondary | ICD-10-CM | POA: Diagnosis present

## 2017-05-21 DIAGNOSIS — G8929 Other chronic pain: Secondary | ICD-10-CM | POA: Insufficient documentation

## 2017-05-21 DIAGNOSIS — F17210 Nicotine dependence, cigarettes, uncomplicated: Secondary | ICD-10-CM | POA: Insufficient documentation

## 2017-05-21 DIAGNOSIS — Z590 Homelessness: Secondary | ICD-10-CM | POA: Insufficient documentation

## 2017-05-21 DIAGNOSIS — T401X1A Poisoning by heroin, accidental (unintentional), initial encounter: Principal | ICD-10-CM | POA: Diagnosis present

## 2017-05-21 DIAGNOSIS — Z880 Allergy status to penicillin: Secondary | ICD-10-CM | POA: Insufficient documentation

## 2017-05-21 DIAGNOSIS — Z885 Allergy status to narcotic agent status: Secondary | ICD-10-CM | POA: Insufficient documentation

## 2017-05-21 DIAGNOSIS — J9600 Acute respiratory failure, unspecified whether with hypoxia or hypercapnia: Secondary | ICD-10-CM | POA: Diagnosis present

## 2017-05-21 DIAGNOSIS — E876 Hypokalemia: Secondary | ICD-10-CM | POA: Diagnosis present

## 2017-05-21 DIAGNOSIS — R945 Abnormal results of liver function studies: Secondary | ICD-10-CM

## 2017-05-21 DIAGNOSIS — F4310 Post-traumatic stress disorder, unspecified: Secondary | ICD-10-CM | POA: Insufficient documentation

## 2017-05-21 DIAGNOSIS — F1120 Opioid dependence, uncomplicated: Secondary | ICD-10-CM | POA: Diagnosis present

## 2017-05-21 LAB — RAPID URINE DRUG SCREEN, HOSP PERFORMED
Amphetamines: POSITIVE — AB
BARBITURATES: NOT DETECTED
BENZODIAZEPINES: POSITIVE — AB
Cocaine: NOT DETECTED
Opiates: POSITIVE — AB
Tetrahydrocannabinol: NOT DETECTED

## 2017-05-21 LAB — CBC WITH DIFFERENTIAL/PLATELET
Basophils Absolute: 0.1 10*3/uL (ref 0.0–0.1)
Basophils Relative: 1 %
EOS ABS: 0.4 10*3/uL (ref 0.0–0.7)
EOS PCT: 4 %
HCT: 37.9 % (ref 36.0–46.0)
Hemoglobin: 12.9 g/dL (ref 12.0–15.0)
LYMPHS ABS: 2.8 10*3/uL (ref 0.7–4.0)
LYMPHS PCT: 31 %
MCH: 28.7 pg (ref 26.0–34.0)
MCHC: 34 g/dL (ref 30.0–36.0)
MCV: 84.2 fL (ref 78.0–100.0)
MONO ABS: 0.8 10*3/uL (ref 0.1–1.0)
Monocytes Relative: 9 %
Neutro Abs: 4.9 10*3/uL (ref 1.7–7.7)
Neutrophils Relative %: 55 %
PLATELETS: 444 10*3/uL — AB (ref 150–400)
RBC: 4.5 MIL/uL (ref 3.87–5.11)
RDW: 15.2 % (ref 11.5–15.5)
WBC: 8.8 10*3/uL (ref 4.0–10.5)

## 2017-05-21 LAB — COMPREHENSIVE METABOLIC PANEL
ALT: 163 U/L — ABNORMAL HIGH (ref 14–54)
ANION GAP: 9 (ref 5–15)
AST: 158 U/L — ABNORMAL HIGH (ref 15–41)
Albumin: 3.8 g/dL (ref 3.5–5.0)
Alkaline Phosphatase: 189 U/L — ABNORMAL HIGH (ref 38–126)
BUN: 11 mg/dL (ref 6–20)
CHLORIDE: 103 mmol/L (ref 101–111)
CO2: 29 mmol/L (ref 22–32)
Calcium: 8.8 mg/dL — ABNORMAL LOW (ref 8.9–10.3)
Creatinine, Ser: 0.9 mg/dL (ref 0.44–1.00)
Glucose, Bld: 152 mg/dL — ABNORMAL HIGH (ref 65–99)
POTASSIUM: 3.2 mmol/L — AB (ref 3.5–5.1)
SODIUM: 141 mmol/L (ref 135–145)
Total Bilirubin: 1.2 mg/dL (ref 0.3–1.2)
Total Protein: 7.1 g/dL (ref 6.5–8.1)

## 2017-05-21 LAB — URINALYSIS, ROUTINE W REFLEX MICROSCOPIC
Bilirubin Urine: NEGATIVE
GLUCOSE, UA: NEGATIVE mg/dL
HGB URINE DIPSTICK: NEGATIVE
KETONES UR: NEGATIVE mg/dL
LEUKOCYTES UA: NEGATIVE
NITRITE: NEGATIVE
PH: 5 (ref 5.0–8.0)
Protein, ur: 30 mg/dL — AB
Specific Gravity, Urine: 1.027 (ref 1.005–1.030)
Squamous Epithelial / LPF: NONE SEEN

## 2017-05-21 LAB — ACETAMINOPHEN LEVEL: Acetaminophen (Tylenol), Serum: 15 ug/mL (ref 10–30)

## 2017-05-21 LAB — MRSA PCR SCREENING: MRSA BY PCR: NEGATIVE

## 2017-05-21 LAB — ETHANOL

## 2017-05-21 LAB — PROTIME-INR
INR: 1.04
PROTHROMBIN TIME: 13.6 s (ref 11.4–15.2)

## 2017-05-21 LAB — SALICYLATE LEVEL

## 2017-05-21 MED ORDER — ONDANSETRON HCL 4 MG/2ML IJ SOLN
4.0000 mg | Freq: Once | INTRAMUSCULAR | Status: AC
  Administered 2017-05-21: 4 mg via INTRAVENOUS
  Filled 2017-05-21: qty 2

## 2017-05-21 MED ORDER — NALOXONE HCL 0.4 MG/ML IJ SOLN: 0.2000 mg | INTRAMUSCULAR | Status: DC | PRN

## 2017-05-21 MED ORDER — ORAL CARE MOUTH RINSE: 15.0000 mL | Freq: Two times a day (BID) | OROMUCOSAL | Status: DC

## 2017-05-21 MED ORDER — SODIUM CHLORIDE 0.9 % IV BOLUS (SEPSIS)
1000.0000 mL | Freq: Once | INTRAVENOUS | Status: AC
  Administered 2017-05-21: 1000 mL via INTRAVENOUS

## 2017-05-21 MED ORDER — ONDANSETRON HCL 4 MG PO TABS: 4.0000 mg | ORAL_TABLET | Freq: Four times a day (QID) | ORAL | Status: DC | PRN

## 2017-05-21 MED ORDER — ONDANSETRON HCL 4 MG/2ML IJ SOLN: 4.0000 mg | Freq: Four times a day (QID) | INTRAMUSCULAR | Status: DC | PRN

## 2017-05-21 MED ORDER — NALOXONE HCL 0.4 MG/ML IJ SOLN
0.2000 mg | Freq: Once | INTRAMUSCULAR | Status: AC
  Administered 2017-05-21: 0.2 mg via INTRAVENOUS
  Filled 2017-05-21: qty 1

## 2017-05-21 MED ORDER — POTASSIUM CHLORIDE CRYS ER 20 MEQ PO TBCR
40.0000 meq | EXTENDED_RELEASE_TABLET | Freq: Once | ORAL | Status: AC
  Administered 2017-05-21: 40 meq via ORAL
  Filled 2017-05-21: qty 2

## 2017-05-21 MED ORDER — SODIUM CHLORIDE 0.9 % IV SOLN
INTRAVENOUS | Status: AC
  Administered 2017-05-21 – 2017-05-22 (×2): via INTRAVENOUS

## 2017-05-21 MED ORDER — SODIUM CHLORIDE 0.9% FLUSH
3.0000 mL | Freq: Two times a day (BID) | INTRAVENOUS | Status: DC
  Administered 2017-05-21 – 2017-05-22 (×2): 3 mL via INTRAVENOUS

## 2017-05-21 MED ORDER — NICOTINE 21 MG/24HR TD PT24
21.0000 mg | MEDICATED_PATCH | Freq: Every day | TRANSDERMAL | Status: DC
  Administered 2017-05-22: 21 mg via TRANSDERMAL
  Filled 2017-05-21: qty 1

## 2017-05-21 NOTE — ED Triage Notes (Signed)
Per [facility] pt picked up from [facility], called out stating pt was unresponsive. On arrival pt respond to stimuli. Pt admits to injecting heorin. States last use before today was about a month ago. Pt stating "She is tired of everything." Pt crying and restless during triage. 16G in left AC per EMS. MD at bedside.

## 2017-05-21 NOTE — H&P (Signed)
[name]:[id number] DOB: 01-Jun-1966 DOA: 05/21/2017     PCP: Marliss Coots, NP   Outpatient Specialists: None  Patient coming from: Homeless    Chief Complaint: Heroin overdose  HPI: [name] is a 51 y.o. female with medical history significant of Homelessness, polysubstance abuse, depression suicidal ideations , PTSD attention deficit disorder, chronic pain, prior heroin overdose    Presented with recurrent heroin overdose patient states that her prior overdose few months ago was only time she ever used having prior to this but she has tested in the past positive for opioids. [facility] 911 was called initially patient was alert and arousable EMS did not require Narcan but she continued to have occasional apnea spells required repeated doses of Narcan in emergency department. She denies any fevers or chills no chest pain or shortness of breath nausea vomiting no diarrhea denies any recent head injury denies suicidal ideations but does state that her mother recently committed suicide states she is very depressed. Denies co ingestants. In emergency department patient continued to have apneic episodes and became hypoxic down to 70%. Although this improved with Narcan Reports using heroine since January states today have injected some crystal meth as well. States took two pills of Acetaminophen 365mg  each  today for pain.    Regarding pertinent Chronic problems: In end of June 2018 patient was admitted for behavioral health for polysubstance abuse and suicidal ideations and undergone clonidine detox protocols   IN ER:  Temp (24hrs), Avg:98 F (36.7 C), Min:98 F (36.7 C), Max:98 F (36.7 C)      on arrival  ED Triage Vitals  Enc Vitals Group     BP 05/21/17 1716 (!) 158/126     Pulse Rate 05/21/17 1716 98     Resp 05/21/17 1716 (!) 30     Temp 05/21/17 1850 98 F (36.7 C)     Temp Source 05/21/17 1850 Oral     SpO2 05/21/17 1716 95 %     Weight --      Height --      Head Circumference --      Peak Flow --      Pain Score --      Pain Loc --      Pain Edu? --      Excl. in [place]? --   RR down to 8 satting 97% HR 80 blood pressure 127/99 U tox positive for benzos opiates and amphetamines WBC 8.8 hemoglobin 12.9 NA 141 potassium 3.2 Chest x-ray nonacute  Following Medications were ordered in ER: Medications  naloxone (NARCAN) injection 0.2 mg (not administered)  sodium chloride 0.9 % bolus 1,000 mL (0 mLs Intravenous Stopped 05/21/17 1928)  ondansetron (ZOFRAN) injection 4 mg (4 mg Intravenous Given 05/21/17 1751)  naloxone (NARCAN) injection 0.2 mg (0.2 mg Intravenous Given 05/21/17 1751)    Hospitalist was called for admission for Heroin overdose  Review of Systems:    Pertinent positives include: depression  anxiety.change in mood   Constitutional:  No weight loss, night sweats, Fevers, chills, fatigue, weight loss  HEENT:  No headaches, Difficulty swallowing,Tooth/dental problems,Sore throat,  No sneezing, itching, ear ache, nasal congestion, post nasal drip,  Cardio-vascular:  No chest pain, Orthopnea, PND, anasarca, dizziness, palpitations.no Bilateral lower extremity swelling  GI:  No heartburn, indigestion, abdominal pain, nausea, vomiting, diarrhea, change in bowel habits, loss of appetite, melena, blood in stool, hematemesis Resp:  no shortness of breath at  rest. No dyspnea on exertion, No excess mucus, no productive cough, No non-productive cough, No coughing up of blood.No change in color of mucus.No wheezing. Skin:  no rash or lesions. No jaundice GU:  no dysuria, change in color of urine, no urgency or frequency. No straining to urinate.  No flank pain.  Musculoskeletal:  No joint pain or no joint swelling. No decreased range of motion. No back pain.  Psych:  No or affect. No  No memory loss.  Neuro: no localizing neurological complaints, no tingling, no weakness, no double vision, no gait  abnormality, no slurred speech, no confusion  As per HPI otherwise 10 point review of systems negative.   Past Medical History: Past Medical History:  Diagnosis Date  . ADD (attention deficit disorder with hyperactivity)   . Bilateral ovarian cysts   . Chlamydia   . Depression   . PID (acute pelvic inflammatory disease)   . PTSD (post-traumatic stress disorder)   . Sciatica    Past Surgical History:  Procedure Laterality Date  . ABDOMINAL HYSTERECTOMY    . BACK SURGERY     dbl lumbar fusion L5-S1  . BLADDER SURGERY    . LAPAROSCOPY    . mesh removel    . ROTATOR CUFF REPAIR Right 02/15/2013     Social History:  Ambulatory  independently   reports that she has been smoking Cigarettes.  She has a 3.50 pack-year smoking history. She uses smokeless tobacco. She reports that she uses drugs. She reports that she does not drink alcohol.  Allergies:   Allergies  Allergen Reactions  . Imitrex [Sumatriptan Base] Shortness Of Breath  . Clarithromycin Nausea And Vomiting  . Clindamycin/Lincomycin Diarrhea    Violently sick-projectile vomiting  . Morphine And Related Nausea And Vomiting  . Vicodin [Hydrocodone-Acetaminophen] Nausea And Vomiting       Family History:  Family History  Problem Relation Age of Onset  . Hypertension Mother   . Heart failure Mother   . Hypertension Father   . Heart failure Father   . Hypertension Sister     Medications: Prior to Admission medications   Medication Sig Start Date End Date Taking? Authorizing Provider  acetaminophen (TYLENOL) 500 MG tablet Take 500 mg by mouth every 6 (six) hours as needed for moderate pain.   Yes [provider]  ibuprofen (ADVIL,MOTRIN) 200 MG tablet Take 200 mg by mouth every 6 (six) hours as needed for moderate pain.   Yes [provider]  sertraline (ZOLOFT) 100 MG tablet Take 2 tablets (200 mg total) by mouth daily. For depression 04/16/17  Yes Lindell Spar I, NP  gabapentin (NEURONTIN)  300 MG capsule Take 1 capsule (300 mg total) by mouth 2 (two) times daily. For agitation 04/15/17   Lindell Spar I, NP  magic mouthwash w/lidocaine SOLN Take 5 mLs by mouth 3 (three) times daily as needed for mouth pain. 04/15/17   Lindell Spar I, NP  nicotine (NICODERM CQ - DOSED IN MG/24 HOURS) 21 mg/24hr patch Place 1 patch (21 mg total) onto the skin daily. For smoking cessation 04/16/17   Lindell Spar I, NP  traZODone (DESYREL) 100 MG tablet Take 1 tablet (50 mg) at bedtime: For sleep 04/15/17   Lindell Spar I, NP    Physical Exam: Patient Vitals for the past 24 hrs:  BP Temp Temp src Pulse Resp SpO2  05/21/17 1850 (!) 149/107 98 F (36.7 C) Oral 98 20 96 %  05/21/17 1800 (!) 151/104 - -  98 (!) 29 91 %  05/21/17 1731 130/85 - - 98 (!) 28 90 %  05/21/17 1716 (!) 158/126 - - 98 (!) 30 95 %    1. General:  in No Acute distress 2. Psychological: Alert and   Oriented to 4 3. Head/ENT:     Dry Mucous Membranes                          Head Non traumatic, neck supple                           Poor Dentition 4. SKIN:   decreased Skin turgor,  Skin clean Dry and intact no rash 5. Heart: Regular rate and rhythm no  Murmur, Rub or gallop 6. Lungs:   no wheezes or crackles   7. Abdomen: Soft, non-tender, Non distended 8. Lower extremities: no clubbing, cyanosis, or edema 9.   strength 5 out of 5 in all 4 extremities cranial nerves II through XII intact 10. MSK: Normal range of motion   body mass index is unknown because there is no height or weight on file.  Labs on Admission:   Labs on Admission: I have personally reviewed following labs and imaging studies  CBC:  Recent Labs Lab 05/21/17 1728  WBC 8.8  NEUTROABS 4.9  HGB 12.9  HCT 37.9  MCV 84.2  PLT 601*   Basic Metabolic Panel:  Recent Labs Lab 05/21/17 1728  NA 141  K 3.2*  CL 103  CO2 29  GLUCOSE 152*  BUN 11  CREATININE 0.90  CALCIUM 8.8*   GFR: CrCl cannot be calculated (Unknown ideal weight.). Liver  Function Tests:  Recent Labs Lab 05/21/17 1728  AST 158*  ALT 163*  ALKPHOS 189*  BILITOT 1.2  PROT 7.1  ALBUMIN 3.8   No results for input(s): LIPASE, AMYLASE in the last 168 hours. No results for input(s): AMMONIA in the last 168 hours. Coagulation Profile: No results for input(s): INR, PROTIME in the last 168 hours. Cardiac Enzymes: No results for input(s): CKTOTAL, CKMB, CKMBINDEX, TROPONINI in the last 168 hours. BNP (last 3 results) No results for input(s): PROBNP in the last 8760 hours. HbA1C: No results for input(s): HGBA1C in the last 72 hours. CBG: No results for input(s): GLUCAP in the last 168 hours. Lipid Profile: No results for input(s): CHOL, HDL, LDLCALC, TRIG, CHOLHDL, LDLDIRECT in the last 72 hours. Thyroid Function Tests: No results for input(s): TSH, T4TOTAL, FREET4, T3FREE, THYROIDAB in the last 72 hours. Anemia Panel: No results for input(s): VITAMINB12, FOLATE, FERRITIN, TIBC, IRON, RETICCTPCT in the last 72 hours. Urine analysis:    Component Value Date/Time   COLORURINE AMBER (A) 05/21/2017 1850   APPEARANCEUR CLEAR 05/21/2017 1850   LABSPEC 1.027 05/21/2017 1850   PHURINE 5.0 05/21/2017 1850   GLUCOSEU NEGATIVE 05/21/2017 1850   HGBUR NEGATIVE 05/21/2017 1850   BILIRUBINUR NEGATIVE 05/21/2017 1850   KETONESUR NEGATIVE 05/21/2017 1850   PROTEINUR 30 (A) 05/21/2017 1850   UROBILINOGEN 0.2 06/27/2015 0225   NITRITE NEGATIVE 05/21/2017 1850   LEUKOCYTESUR NEGATIVE 05/21/2017 1850   Sepsis Labs: @LABRCNTIP (procalcitonin:4,lacticidven:4) )No results found for this or any previous visit (from the past 240 hour(s)).    UA   no evidence of UTI   Lab Results  Component Value Date   HGBA1C 5.9 (H) 04/11/2017    CrCl cannot be calculated (Unknown ideal weight.).  BNP (last 3 results)  No results for input(s): PROBNP in the last 8760 hours.   ECG REPORT  Independently reviewed Rate: 99  Rhythm: Sinus tachycardia with incomplete right  bundle-branch block ST&T Change: No acute ischemic changes   [id number]  There were no vitals filed for this visit.   Cultures:    Component Value Date/Time   SDES URINE, RANDOM 01/30/2017 2113   SPECREQUEST NONE 01/30/2017 2113   CULT (A) 01/30/2017 2113    <10,000 COLONIES/mL INSIGNIFICANT GROWTH Performed at [facility] [address], [place], [place] [address]    REPTSTATUS 02/01/2017 FINAL 01/30/2017 2113     Radiological Exams on Admission: Dg Chest Portable 1 View  Result Date: 05/21/2017 CLINICAL DATA:  Patient found unresponsive. EXAM: PORTABLE CHEST 1 VIEW COMPARISON:  Chest radiograph 04/08/2017. FINDINGS: Monitoring leads overlie the patient. Cardiac contours upper limits of normal. No consolidative pulmonary opacities. No pleural effusion or pneumothorax. IMPRESSION: No acute cardiopulmonary process. Electronically Signed   By: Lovey Newcomer M.D.   On: 05/21/2017 18:06    Chart has been reviewed    Assessment/Plan   51 y.o. female with medical history significant of Homelessness, polysubstance abuse, depression suicidal ideations , PTSD attention deficit disorder, chronic pain, prior heroin overdose     Admitted for  Heroin overdose  Present on Admission: . Major depressive disorder, recurrent severe without psychotic features ([place]) given intermittent somnolence will hold home medications for tonight appreciate psychiatry consult and recommendations . Polysubstance (including opioids) dependence with physiological dependence ([place]) - he shouldn't unfortunately frequently changing story as her history of stenting and she only used her red but now admitted to using also crystal meth this point she still states she was not in trying to harm herself patient still appears to be under influence will observe overnight continue suicide percussions until substances have cleared and patient able to answer questions consistently . Heroin overdose - with intermittent need for  Narcan continue to observe and step down given intermittent sedation requiring recurrent doses of Narcan. . Acute respiratory failure ([facility]) continue oxygen protein continuous pulse ox monitoring to evaluate for apneic episode . Hypokalemia replace and check magnesium level . Elevated LFTs check INR and hepatitis serologies repeat acetaminophen level in 4 hours   Other plan as per orders.  DVT prophylaxis:  SCD     Code Status:  FULL CODE  Family Communication:   Family not  at  Bedside     Disposition Plan:     To home once workup is complete and patient is stable         Social Work   consulted                          Consults called: Behavioral health consulted   Admission status:  obs   Level of care        SDU      I have spent a total of 67 min on this admission   extra time was spent to discuss case with consultants  Tiffini Blacksher 05/21/2017, 9:32 PM   [facility]  Pager [phone]   after 2 AM please page floor coverage PA If 7AM-7PM, please contact the day team taking care of the patient  [email or web address]  Password [email or web address]

## 2017-05-21 NOTE — ED Notes (Signed)
Hospitalist at bedside 

## 2017-05-21 NOTE — ED Notes (Signed)
Bed: QZ30 Expected date:  Expected time:  Means of arrival:  Comments: EMS-heroin OD

## 2017-05-21 NOTE — ED Provider Notes (Signed)
[facility] DEPT Provider Note   CSN: [id number] Arrival date & time: 05/21/17  1705     History   Chief Complaint Chief Complaint  Patient presents with  . Drug Overdose    HPI [name] is a 51 y.o. female.  The history is provided by the patient, the EMS personnel and medical records.  Altered Mental Status   This is a new problem. The current episode started less than 1 hour ago. The problem has been gradually improving. Associated symptoms include somnolence and agitation. Pertinent negatives include no weakness, no hallucinations, no self-injury and no violence. Risk factors include illicit drug use (injection of heroin and crystal meth). Her past medical history does not include diabetes, seizures, CVA, hypertension, COPD, depression, dementia or head trauma.    Past Medical History:  Diagnosis Date  . ADD (attention deficit disorder with hyperactivity)   . Bilateral ovarian cysts   . Chlamydia   . Depression   . PID (acute pelvic inflammatory disease)   . PTSD (post-traumatic stress disorder)   . Sciatica     Patient Active Problem List   Diagnosis Date Noted  . MDD (major depressive disorder), recurrent episode, severe ([place]) 04/09/2017  . Polysubstance (including opioids) dependence with physiological dependence ([place]) 04/09/2017  . Major depressive disorder, recurrent severe without psychotic features ([place]) 04/09/2017  . Chronic back pain 12/06/2013  . PTSD (post-traumatic stress disorder) 12/06/2013  . Other screening mammogram 12/06/2013  . ADD (attention deficit disorder) 12/06/2013    Past Surgical History:  Procedure Laterality Date  . ABDOMINAL HYSTERECTOMY    . BACK SURGERY     dbl lumbar fusion L5-S1  . BLADDER SURGERY    . LAPAROSCOPY    . mesh removel    . ROTATOR CUFF REPAIR Right 02/15/2013    OB History    No data available       Home Medications    Prior to Admission medications   Medication Sig Start Date End Date  Taking? Authorizing Provider  gabapentin (NEURONTIN) 300 MG capsule Take 1 capsule (300 mg total) by mouth 2 (two) times daily. For agitation 04/15/17   Lindell Spar I, NP  magic mouthwash w/lidocaine SOLN Take 5 mLs by mouth 3 (three) times daily as needed for mouth pain. 04/15/17   Lindell Spar I, NP  nicotine (NICODERM CQ - DOSED IN MG/24 HOURS) 21 mg/24hr patch Place 1 patch (21 mg total) onto the skin daily. For smoking cessation 04/16/17   Lindell Spar I, NP  sertraline (ZOLOFT) 100 MG tablet Take 2 tablets (200 mg total) by mouth daily. For depression 04/16/17   Lindell Spar I, NP  traZODone (DESYREL) 100 MG tablet Take 1 tablet (50 mg) at bedtime: For sleep 04/15/17   Encarnacion Slates, NP    Family History Family History  Problem Relation Age of Onset  . Hypertension Mother   . Heart failure Mother   . Hypertension Father   . Heart failure Father   . Hypertension Sister     Social History Social History  Substance Use Topics  . Smoking status: Current Every Day Smoker    Packs/day: 0.25    Years: 14.00    Types: Cigarettes  . Smokeless tobacco: Current User  . Alcohol use No     Allergies   Imitrex [sumatriptan base]; Clarithromycin; Clindamycin/lincomycin; Morphine and related; and Vicodin [hydrocodone-acetaminophen]   Review of Systems Review of Systems  Unable to perform ROS: Mental status change  Constitutional: Negative for  chills.  Eyes: Negative for visual disturbance.  Respiratory: Negative for chest tightness, shortness of breath and stridor.   Cardiovascular: Negative for chest pain.  Gastrointestinal: Negative for abdominal pain.  Genitourinary: Negative for dysuria.  Musculoskeletal: Negative for back pain.  Neurological: Negative for weakness and headaches.  Psychiatric/Behavioral: Positive for agitation. Negative for hallucinations and self-injury.     Physical Exam Updated Vital Signs There were no vitals taken for this visit.  Physical Exam    Constitutional: She appears well-developed and well-nourished. No distress.  HENT:  Head: Normocephalic.  Nose: Nose normal.  Mouth/Throat: Oropharynx is clear and moist. No oropharyngeal exudate.  Eyes: Pupils are equal, round, and reactive to light. Conjunctivae and EOM are normal.  Neck: Normal range of motion.  Cardiovascular: Normal rate and intact distal pulses.   No murmur heard. Pulmonary/Chest: No stridor. Bradypnea noted. She has no decreased breath sounds. She has no wheezes. She has no rhonchi. She has no rales. She exhibits no tenderness.  Abdominal: There is no tenderness.  Musculoskeletal: She exhibits no tenderness.  Neurological: No cranial nerve deficit or sensory deficit. She exhibits normal muscle tone.  Skin: Skin is warm. Capillary refill takes less than 2 seconds. No rash noted. She is not diaphoretic. No erythema.  Psychiatric: Her mood appears anxious. Her affect is labile. She is not actively hallucinating. She exhibits a depressed mood.  Nursing note and vitals reviewed.    ED Treatments / Results  Labs (all labs ordered are listed, but only abnormal results are displayed) Labs Reviewed  CBC WITH DIFFERENTIAL/PLATELET - Abnormal; Notable for the following:       Result Value   Platelets 444 (*)    All other components within normal limits  COMPREHENSIVE METABOLIC PANEL - Abnormal; Notable for the following:    Potassium 3.2 (*)    Glucose, Bld 152 (*)    Calcium 8.8 (*)    AST 158 (*)    ALT 163 (*)    Alkaline Phosphatase 189 (*)    All other components within normal limits  URINALYSIS, ROUTINE W REFLEX MICROSCOPIC - Abnormal; Notable for the following:    Color, Urine AMBER (*)    Protein, ur 30 (*)    Bacteria, UA FEW (*)    All other components within normal limits  RAPID URINE DRUG SCREEN, HOSP PERFORMED - Abnormal; Notable for the following:    Opiates POSITIVE (*)    Benzodiazepines POSITIVE (*)    Amphetamines POSITIVE (*)    All other  components within normal limits  HEPATITIS PANEL, ACUTE - Abnormal; Notable for the following:    HCV Ab >11.0 (*)    All other components within normal limits  ACETAMINOPHEN LEVEL - Abnormal; Notable for the following:    Acetaminophen (Tylenol), Serum <10 (*)    All other components within normal limits  COMPREHENSIVE METABOLIC PANEL - Abnormal; Notable for the following:    Calcium 8.3 (*)    Total Protein 6.1 (*)    Albumin 3.1 (*)    AST 125 (*)    ALT 130 (*)    Alkaline Phosphatase 149 (*)    All other components within normal limits  CBC - Abnormal; Notable for the following:    Hemoglobin 11.5 (*)    HCT 34.9 (*)    All other components within normal limits  MRSA PCR SCREENING  ACETAMINOPHEN LEVEL  SALICYLATE LEVEL  ETHANOL  PROTIME-INR  MAGNESIUM  PHOSPHORUS  TSH  HIV ANTIBODY (ROUTINE TESTING)  EKG  EKG Interpretation  Date/Time:  Friday May 21 2017 17:36:31 EDT Ventricular Rate:  99 PR Interval:    QRS Duration: 116 QT Interval:  380 QTC Calculation: 488 R Axis:   18 Text Interpretation:  Sinus rhythm Incomplete right bundle branch block when compared to prior, no significant changes seen.  No STEMI.  Confirmed by Antony Blackbird [id number]) on 05/21/2017 5:40:24 PM       Radiology Dg Chest Portable 1 View  Result Date: 05/21/2017 CLINICAL DATA:  Patient found unresponsive. EXAM: PORTABLE CHEST 1 VIEW COMPARISON:  Chest radiograph 04/08/2017. FINDINGS: Monitoring leads overlie the patient. Cardiac contours upper limits of normal. No consolidative pulmonary opacities. No pleural effusion or pneumothorax. IMPRESSION: No acute cardiopulmonary process. Electronically Signed   By: Lovey Newcomer M.D.   On: 05/21/2017 18:06    Procedures Procedures (including critical care time)  Medications Ordered in ED Medications  naloxone (NARCAN) injection 0.2 mg (not administered)  nicotine (NICODERM CQ - dosed in mg/24 hours) patch 21 mg (21 mg Transdermal Patch  Applied 05/22/17 1100)  ondansetron (ZOFRAN) tablet 4 mg (not administered)    Or  ondansetron (ZOFRAN) injection 4 mg (not administered)  sodium chloride flush (NS) 0.9 % injection 3 mL (3 mLs Intravenous Given 05/22/17 1100)  0.9 %  sodium chloride infusion ( Intravenous New Bag/Given 05/22/17 0847)  MEDLINE mouth rinse (15 mLs Mouth Rinse Not Given 05/22/17 1000)  sodium chloride 0.9 % bolus 1,000 mL (0 mLs Intravenous Stopped 05/21/17 1928)  ondansetron (ZOFRAN) injection 4 mg (4 mg Intravenous Given 05/21/17 1751)  naloxone (NARCAN) injection 0.2 mg (0.2 mg Intravenous Given 05/21/17 1751)  naloxone (NARCAN) injection 0.2 mg (0.2 mg Intravenous Given 05/21/17 2002)  potassium chloride SA (K-DUR,KLOR-CON) CR tablet 40 mEq (40 mEq Oral Given 05/21/17 2213)     Initial Impression / Assessment and Plan / ED Course  I have reviewed the triage vital signs and the nursing notes.  Pertinent labs & imaging results that were available during my care of the patient were reviewed by me and considered in my medical decision making (see chart for details).     [name] is a 51 y.o. female with a past medical history significant for PTSD, depression, ADD, chronic pain, and history of heroin overdose who presents with likely heroin overdose. Patient brought in by EMS. Patient says that she last used heroin several months ago when she overdosed. She says she has not used again until today when she injected amount of heroin into her left hand. She says that she went unresponsive and was found at [facility] where 911 was called. EMS reports that patient was artery awake and arousable when they arrived and they did not need to administer Narcan. They report that patient has however had waxing and waning sleepiness and apnea spells. Patient maintaining her oxygen saturation on room air when she was awake however when she fell asleep, it would drop.  Patient denies any other complaints today in regards to physical  concerns. She denies any recent fevers, chills, chest pain, shortness breath, nausea, vomiting, constipation, diarrhea, dysuria. She denies any recent traumatic injuries. She reports feeling very depressed and says that her mother recently committed suicide. She reports that she is now homeless.  When asked if she had suicidal ideation or homicidal edition she denied it. She does say that she has thoughts about not being here but she says she would never act upon it due to seeing the trauma that her  mother's suicide left the family.   On physical exam, patient's lungs are clear. Abdomen was nontender. No significant lower extremity edema. No significant tenderness on exam. Patient has symmetric pulses in all extremities. Patient is alert and oriented. Patient is tearful on initial evaluation. Patient has pinpoint pupils.  Patient will have laboratory testing, x-ray to look for aspiration given the unresponsive episode, and will be given fluids.  Nursing reports that patient has intermittent somnolence when not being spoken with. We will attempt to get a sitter however if patient continues to have drops in oxygen saturations and breathing, will consider Narcan.  Nursing reports that patient became more somnolent. Patient's pupils are still pinpoint. Patient had oxygen saturations in the 70s. Patient was given 0.2 Narcan with significant improvement in mental status. Patient will be observed.   Patients laboratory testing continue to return showing no evidence of alcohol intoxication. UDS shows evidence of amphetamines, opiates, and benzodiazepines.  When patient was asked about this, she reports that there "was probably some crystal meth mixed in with the heroin I injected". This likely explains the patient's agitated behavior in between episodes of somnolence.  Other laboratory testing shows no evidence of UTI. Metabolic panel shows evidence of liver function elevation. Given her new IV drug use,  hepatitis panel was added. Chest x-ray shows no acute cardiac pulmonary disease.  Given patients continued altered mental status with waxing and waning somnolence requiring Narcan versus agitation requiring a center and the patient's report that she was very depressed and had some suicidal thoughts, decision made to admit patient to hospitalist service for further management. Patient agreed with this admission plan.  Patient admitted in stable condition to hospitalist service.    Final Clinical Impressions(s) / ED Diagnoses   Final diagnoses:  Accidental overdose of heroin, initial encounter    Clinical Impression: 1. Accidental overdose of heroin, initial encounter     Disposition: Admit to hospitalist service    Tegeler, Gwenyth Allegra, MD 05/22/17 1204

## 2017-05-22 DIAGNOSIS — R7989 Other specified abnormal findings of blood chemistry: Secondary | ICD-10-CM

## 2017-05-22 DIAGNOSIS — E876 Hypokalemia: Secondary | ICD-10-CM

## 2017-05-22 DIAGNOSIS — T401X1A Poisoning by heroin, accidental (unintentional), initial encounter: Secondary | ICD-10-CM

## 2017-05-22 DIAGNOSIS — J9601 Acute respiratory failure with hypoxia: Secondary | ICD-10-CM

## 2017-05-22 LAB — TSH: TSH: 1.36 u[IU]/mL (ref 0.350–4.500)

## 2017-05-22 LAB — COMPREHENSIVE METABOLIC PANEL
ALBUMIN: 3.1 g/dL — AB (ref 3.5–5.0)
ALK PHOS: 149 U/L — AB (ref 38–126)
ALT: 130 U/L — AB (ref 14–54)
AST: 125 U/L — AB (ref 15–41)
Anion gap: 6 (ref 5–15)
BUN: 11 mg/dL (ref 6–20)
CALCIUM: 8.3 mg/dL — AB (ref 8.9–10.3)
CHLORIDE: 106 mmol/L (ref 101–111)
CO2: 28 mmol/L (ref 22–32)
CREATININE: 0.69 mg/dL (ref 0.44–1.00)
GFR calc Af Amer: >60 mL/min (ref >60)
GFR calc non Af Amer: >60 mL/min (ref >60)
GLUCOSE: 90 mg/dL (ref 65–99)
Potassium: 4.2 mmol/L (ref 3.5–5.1)
SODIUM: 140 mmol/L (ref 135–145)
Total Bilirubin: 1.2 mg/dL (ref 0.3–1.2)
Total Protein: 6.1 g/dL — ABNORMAL LOW (ref 6.5–8.1)

## 2017-05-22 LAB — HEPATITIS PANEL, ACUTE
HEP A IGM: NEGATIVE
HEP B C IGM: NEGATIVE
HEP B S AG: NEGATIVE

## 2017-05-22 LAB — CBC
HCT: 34.9 % — ABNORMAL LOW (ref 36.0–46.0)
Hemoglobin: 11.5 g/dL — ABNORMAL LOW (ref 12.0–15.0)
MCH: 27.8 pg (ref 26.0–34.0)
MCHC: 33 g/dL (ref 30.0–36.0)
MCV: 84.5 fL (ref 78.0–100.0)
PLATELETS: 321 10*3/uL (ref 150–400)
RBC: 4.13 MIL/uL (ref 3.87–5.11)
RDW: 15.4 % (ref 11.5–15.5)
WBC: 7.9 10*3/uL (ref 4.0–10.5)

## 2017-05-22 LAB — HIV ANTIBODY (ROUTINE TESTING W REFLEX): HIV Screen 4th Generation wRfx: NONREACTIVE

## 2017-05-22 LAB — MAGNESIUM: Magnesium: 1.9 mg/dL (ref 1.7–2.4)

## 2017-05-22 LAB — ACETAMINOPHEN LEVEL: Acetaminophen (Tylenol), Serum: <10 ug/mL — ABNORMAL LOW (ref 10–30)

## 2017-05-22 LAB — PHOSPHORUS: Phosphorus: 4.3 mg/dL (ref 2.5–4.6)

## 2017-05-22 NOTE — Discharge Summary (Signed)
Physician Discharge Summary  [name]:[id number] DOB: 1965/12/27 DOA: 05/21/2017  PCP: Marliss Coots, NP  Admit date: 05/21/2017 Discharge date: 05/22/2017  [facility] Disposition: Home   Recommendations for Outpatient Follow-up:  1. Substance abuse resources provided and reviewed. Social worker consulted.  2. Follow up with PCP in 1-2 weeks, recheck LFTs (mildly elevated at admission) and consider evaluation for OSA.   Home Health: None Equipment/Devices: None Discharge Condition: Stable, improved.  CODE STATUS: Full Diet recommendation: Regular  Brief/Interim Summary: [name] is a 51 y.o. female with medical history significant of homelessness, polysubstance abuse, depression and chronic pain who presented for heroin overdose. UDS also showed amphetamines which she admitted to as well. She had [facility] when she feared she had overdosed, was found to be intermittently somnolent. While sleeping, she had occasional apnea spells treated with narcan. Due to these episodes, she was observed in the step down unit overnight. She has continued to oxygenate and showed no evidence of respiratory distress. The following morning she is alert, though feels restless, without nausea, vomiting, diarrhea, sweating or hallucinations.    In end of June 2018 patient was admitted for behavioral health for polysubstance abuse and suicidal ideations and undergone clonidine detox protocols. Currently strongly denies any suicidal thoughts or intention. Wants to get clean, says this was a one-time lapse after recent history of sobriety. She was taken off guard by someone who just showed up and offered free drugs with everyone partaking. She refused any assistance by social work to enter treatment.   Discharge Diagnoses:  Active Problems:   Polysubstance (including opioids) dependence with physiological dependence (H[facility])   Major depressive disorder, recurrent severe  without psychotic features ([place])   Heroin overdose   Acute respiratory failure ([facility])   Hypokalemia   Elevated LFTs  Polysubstance abuse with acute intoxication with heroin and methamphetamine:  - Was admitted overnight for observation due to somnolence and associated apneic episodes. These have resolved. She is in no distress. Will discharge, recommending follow up at treatment center. CSW worked with patient prior to discharge.   Elevated LFTs: Improved, suspect due to drug use. INR normal, hepatitis serologies negative. Tylenol level initially 15, then negative.  - Follow up  Discharge Instructions Discharge Instructions    Discharge instructions    Complete by:  As directed    You were brought in for observation after a heroin/methamphetamine overdose. You are no longer in danger of respiratory arrest and may be discharged with the following recommendations:  - You MUST not use or misuse and sedative medications or substances. Do not use heroin or meth or any other drugs. You are at very high risk of death if you overdose. - Follow up with your doctor soon to discuss testing and treatment for sleep apnea.  - It would be beneficial to go to a NA or AA meeting. If you start having thoughts of using, call someone or go to a meeting.     Allergies as of 05/22/2017      Reactions   Imitrex [sumatriptan Base] Shortness Of Breath   Clarithromycin Nausea And Vomiting   Clindamycin/lincomycin Diarrhea   Violently sick-projectile vomiting   Morphine And Related Nausea And Vomiting   Vicodin [hydrocodone-acetaminophen] Nausea And Vomiting      Medication List    TAKE these medications   acetaminophen 500 MG tablet Commonly known as:  TYLENOL Take 500 mg by mouth every 6 (six) hours as  needed for moderate pain.   gabapentin 300 MG capsule Commonly known as:  NEURONTIN Take 1 capsule (300 mg total) by mouth 2 (two) times daily. For agitation   ibuprofen 200 MG tablet Commonly known  as:  ADVIL,MOTRIN Take 200 mg by mouth every 6 (six) hours as needed for moderate pain.   magic mouthwash w/lidocaine Soln Take 5 mLs by mouth 3 (three) times daily as needed for mouth pain.   nicotine 21 mg/24hr patch Commonly known as:  NICODERM CQ - dosed in mg/24 hours Place 1 patch (21 mg total) onto the skin daily. For smoking cessation   sertraline 100 MG tablet Commonly known as:  ZOLOFT Take 2 tablets (200 mg total) by mouth daily. For depression   traZODone 100 MG tablet Commonly known as:  DESYREL Take 1 tablet (50 mg) at bedtime: For sleep      Follow-up Information    [name], [name], NP Follow up.   Contact information: [place] [address] [phone]          Allergies  Allergen Reactions  . Imitrex [Sumatriptan Base] Shortness Of Breath  . Clarithromycin Nausea And Vomiting  . Clindamycin/Lincomycin Diarrhea    Violently sick-projectile vomiting  . Morphine And Related Nausea And Vomiting  . Vicodin [Hydrocodone-Acetaminophen] Nausea And Vomiting    Consultations:  CSW  Procedures/Studies: Dg Chest Portable 1 View  Result Date: 05/21/2017 CLINICAL DATA:  Patient found unresponsive. EXAM: PORTABLE CHEST 1 VIEW COMPARISON:  Chest radiograph 04/08/2017. FINDINGS: Monitoring leads overlie the patient. Cardiac contours upper limits of normal. No consolidative pulmonary opacities. No pleural effusion or pneumothorax. IMPRESSION: No acute cardiopulmonary process. Electronically Signed   By: Lovey Newcomer M.D.   On: 05/21/2017 18:06   Subjective: Pt did not sleep well, feels restless.   Discharge Exam: BP (!) 150/96   Pulse 74   Temp 97.7 F (36.5 C) (Oral)   Resp (!) 21   Ht 5' 6.5" (1.689 m)   Wt 105.1 kg (231 lb 11.3 oz)   SpO2 97%   BMI 36.84 kg/m   General: Pt is alert, awake, not in acute distress Cardiovascular: RRR, S1/S2 +, no rubs, no gallops Respiratory: CTA bilaterally, no wheezing, no rhonchi Abdominal: Soft,  NT, ND, bowel sounds + Extremities: No edema, no cyanosis Neuro: Restless but able to be still with intention. No tremor. Pupils normal and reactive bilaterally. No focal deficits.   Labs: Basic Metabolic Panel:  Recent Labs Lab 05/21/17 1728 05/22/17 0347  NA 141 140  K 3.2* 4.2  CL 103 106  CO2 29 28  GLUCOSE 152* 90  BUN 11 11  CREATININE 0.90 0.69  CALCIUM 8.8* 8.3*  MG  --  1.9  PHOS  --  4.3   Liver Function Tests:  Recent Labs Lab 05/21/17 1728 05/22/17 0347  AST 158* 125*  ALT 163* 130*  ALKPHOS 189* 149*  BILITOT 1.2 1.2  PROT 7.1 6.1*  ALBUMIN 3.8 3.1*   CBC:  Recent Labs Lab 05/21/17 1728 05/22/17 0347  WBC 8.8 7.9  NEUTROABS 4.9  --   HGB 12.9 11.5*  HCT 37.9 34.9*  MCV 84.2 84.5  PLT 444* 321   Thyroid function studies  Recent Labs  05/22/17 0347  TSH 1.360   Urinalysis    Component Value Date/Time   COLORURINE AMBER (A) 05/21/2017 1850   APPEARANCEUR CLEAR 05/21/2017 1850   LABSPEC 1.027 05/21/2017 1850   PHURINE 5.0 05/21/2017 1850   GLUCOSEU  NEGATIVE 05/21/2017 1850   HGBUR NEGATIVE 05/21/2017 1850   BILIRUBINUR NEGATIVE 05/21/2017 1850   KETONESUR NEGATIVE 05/21/2017 1850   PROTEINUR 30 (A) 05/21/2017 1850   UROBILINOGEN 0.2 06/27/2015 0225   NITRITE NEGATIVE 05/21/2017 [place] 05/21/2017 1850    Microbiology Recent Results (from the past 240 hour(s))  MRSA PCR Screening     Status: None   Collection Time: 05/21/17 10:31 PM  Result Value Ref Range Status   MRSA by PCR NEGATIVE NEGATIVE Final    Comment:        The GeneXpert MRSA Assay (FDA approved for NASAL specimens only), is one component of a comprehensive MRSA colonization surveillance program. It is not intended to diagnose MRSA infection nor to guide or monitor treatment for MRSA infections.     Time coordinating discharge: Approximately 40 minutes  Vance Gather, MD  [facility] 05/22/2017, 9:44 AM Pager [phone]
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
## 2017-05-29 ENCOUNTER — Emergency Department (HOSPITAL_COMMUNITY)
Admission: EM | Admit: 2017-05-29 | Discharge: 2017-05-29 | Disposition: A | Discharge status: Home / Self Care | Payer: No Typology Code available for payment source | Attending: Emergency Medicine | Admitting: Emergency Medicine

## 2017-05-29 ENCOUNTER — Encounter (HOSPITAL_COMMUNITY): Payer: Self-pay | Admitting: Emergency Medicine

## 2017-05-29 DIAGNOSIS — Z79899 Other long term (current) drug therapy: Secondary | ICD-10-CM | POA: Insufficient documentation

## 2017-05-29 DIAGNOSIS — F17210 Nicotine dependence, cigarettes, uncomplicated: Secondary | ICD-10-CM | POA: Insufficient documentation

## 2017-05-29 DIAGNOSIS — K047 Periapical abscess without sinus: Secondary | ICD-10-CM | POA: Insufficient documentation

## 2017-05-29 MED ORDER — AMOXICILLIN-POT CLAVULANATE 875-125 MG PO TABS
1.0000 | ORAL_TABLET | Freq: Once | ORAL | Status: AC
  Administered 2017-05-29: 1 via ORAL
  Filled 2017-05-29: qty 1

## 2017-05-29 MED ORDER — BUPIVACAINE-EPINEPHRINE (PF) 0.5% -1:200000 IJ SOLN
1.8000 mL | Freq: Once | INTRAMUSCULAR | Status: AC
  Administered 2017-05-29: 1.8 mL
  Filled 2017-05-29: qty 1.8

## 2017-05-29 MED ORDER — AMOXICILLIN-POT CLAVULANATE 875-125 MG PO TABS: 1.0000 | ORAL_TABLET | Freq: Two times a day (BID) | ORAL | 0 refills | Status: DC

## 2017-05-29 NOTE — ED Provider Notes (Signed)
[facility] DEPT Provider Note   CSN: [id number] Arrival date & time: 05/29/17  0117     History   Chief Complaint Chief Complaint  Patient presents with  . Dental Pain    HPI [name] is a 51 y.o. female.  HPI   51 year old female presents today with complaints of dental pain. Patient reports 3 weeks ago she had a tooth was cracked on the left upper jaw. She had since then she's had swelling and worsening pain. Patient reports she does have a dentist but has not been in to see them yet. Patient reports using Tylenol and ibuprofen as needed for pain with no resolution of symptoms. She denies any fever or chills, nausea or vomiting, or extension into the surrounding tissues.   Past Medical History:  Diagnosis Date  . ADD (attention deficit disorder with hyperactivity)   . Bilateral ovarian cysts   . Chlamydia   . Depression   . PID (acute pelvic inflammatory disease)   . PTSD (post-traumatic stress disorder)   . Sciatica     Patient Active Problem List   Diagnosis Date Noted  . Heroin overdose 05/21/2017  . Acute respiratory failure ([place]) 05/21/2017  . Hypokalemia 05/21/2017  . Elevated LFTs 05/21/2017  . MDD (major depressive disorder), recurrent episode, severe ([place]) 04/09/2017  . Polysubstance (including opioids) dependence with physiological dependence ([place]) 04/09/2017  . Major depressive disorder, recurrent severe without psychotic features ([place]) 04/09/2017  . Chronic back pain 12/06/2013  . PTSD (post-traumatic stress disorder) 12/06/2013  . Other screening mammogram 12/06/2013  . ADD (attention deficit disorder) 12/06/2013    Past Surgical History:  Procedure Laterality Date  . ABDOMINAL HYSTERECTOMY    . BACK SURGERY     dbl lumbar fusion L5-S1  . BLADDER SURGERY    . LAPAROSCOPY    . mesh removel    . ROTATOR CUFF REPAIR Right 02/15/2013    OB History    No data available       Home Medications    Prior to Admission medications     Medication Sig Start Date End Date Taking? Authorizing Provider  acetaminophen (TYLENOL) 500 MG tablet Take 500 mg by mouth every 6 (six) hours as needed for moderate pain.    [provider]  amoxicillin-clavulanate (AUGMENTIN) 875-125 MG tablet Take 1 tablet by mouth every 12 (twelve) hours. 05/29/17   Zavannah Deblois, Dellis Filbert, PA-C  gabapentin (NEURONTIN) 300 MG capsule Take 1 capsule (300 mg total) by mouth 2 (two) times daily. For agitation 04/15/17   Lindell Spar I, NP  ibuprofen (ADVIL,MOTRIN) 200 MG tablet Take 200 mg by mouth every 6 (six) hours as needed for moderate pain.    [provider]  magic mouthwash w/lidocaine SOLN Take 5 mLs by mouth 3 (three) times daily as needed for mouth pain. 04/15/17   Lindell Spar I, NP  nicotine (NICODERM CQ - DOSED IN MG/24 HOURS) 21 mg/24hr patch Place 1 patch (21 mg total) onto the skin daily. For smoking cessation 04/16/17   Lindell Spar I, NP  sertraline (ZOLOFT) 100 MG tablet Take 2 tablets (200 mg total) by mouth daily. For depression 04/16/17   Lindell Spar I, NP  traZODone (DESYREL) 100 MG tablet Take 1 tablet (50 mg) at bedtime: For sleep 04/15/17   Encarnacion Slates, NP    Family History Family History  Problem Relation Age of Onset  . Hypertension Mother   . Heart failure Mother   . Hypertension Father   .  Heart failure Father   . Hypertension Sister   . Cancer Other     Social History Social History  Substance Use Topics  . Smoking status: Current Every Day Smoker    Packs/day: 0.25    Years: 14.00    Types: Cigarettes  . Smokeless tobacco: Current User  . Alcohol use No     Allergies   Imitrex [sumatriptan base]; Clarithromycin; Clindamycin/lincomycin; Morphine and related; and Vicodin [hydrocodone-acetaminophen]   Review of Systems Review of Systems  All other systems reviewed and are negative.    Physical Exam Updated Vital Signs BP (!) 174/88 (BP Location: Left Arm)   Pulse 89   Temp 97.8 F (36.6 C)  (Oral)   Resp 16   Ht 5' 6.5" (1.689 m)   Wt 105.2 kg (232 lb)   SpO2 99%   BMI 36.88 kg/m   Physical Exam  Constitutional: She is oriented to person, place, and time. She appears well-developed and well-nourished.  HENT:  Head: Normocephalic and atraumatic.  Swelling to the left lateral upper gumline- numerous dental caries noted with several surgically removed teeth  Neck supple full active range of motion  Eyes: Pupils are equal, round, and reactive to light. Conjunctivae are normal. Right eye exhibits no discharge. Left eye exhibits no discharge. No scleral icterus.  Neck: Normal range of motion. No JVD present. No tracheal deviation present.  Pulmonary/Chest: Effort normal. No stridor.  Neurological: She is alert and oriented to person, place, and time. Coordination normal.  Psychiatric: She has a normal mood and affect. Her behavior is normal. Judgment and thought content normal.  Nursing note and vitals reviewed.    ED Treatments / Results  Labs (all labs ordered are listed, but only abnormal results are displayed) Labs Reviewed - No data to display  EKG  EKG Interpretation None       Radiology No results found.  Procedures Procedures (including critical care time)  INCISION AND DRAINAGE Performed by: Elmer Ramp Consent: Verbal consent obtained. Risks and benefits: risks, benefits and alternatives were discussed Type: abscess  Body area: left upper maxilla   Anesthesia: local infiltration  Incision was made with a scalpel.  Local anesthetic: lidocaine 2% with epinephrine  Anesthetic total: 1.8 ml  Complexity: complex Blunt dissection to break up loculations  Drainage: purulent  Drainage amount: 2 cc   Patient tolerance: Patient tolerated the procedure well with no immediate complications.   Medications Ordered in ED Medications  bupivacaine-epinephrine (MARCAINE W/ EPI) 0.5% -1:200000 injection 1.8 mL (1.8 mLs Infiltration Given  05/29/17 0415)  amoxicillin-clavulanate (AUGMENTIN) 875-125 MG per tablet 1 tablet (1 tablet Oral Given 05/29/17 5462)     Initial Impression / Assessment and Plan / ED Course  I have reviewed the triage vital signs and the nursing notes.  Pertinent labs & imaging results that were available during my care of the patient were reviewed by me and considered in my medical decision making (see chart for details).      Final Clinical Impressions(s) / ED Diagnoses   Final diagnoses:  Dental abscess    Labs:   Imaging:  Consults:  Therapeutics:  Discharge Meds:   Assessment/Plan: 51 year old female presents with dental abscess. Dental block unsuccessful. I&D successfully purulent drainage. Patient's pain improved after I&D. Started on antibiotics, outpatient dental follow-up. Return precautions given. Patient verbalizes understanding and agreement to today's plan had no further questions or concerns.   New Prescriptions Discharge Medication List as of 05/29/2017  5:02 AM  START taking these medications   Details  amoxicillin-clavulanate (AUGMENTIN) 875-125 MG tablet Take 1 tablet by mouth every 12 (twelve) hours., Starting Sat 05/29/2017, Print         Kay Shippy, [place], PA-C 05/29/17 [place], Delice Bison, DO 05/29/17 [phone]

## 2017-05-29 NOTE — ED Triage Notes (Signed)
Pt states she had a bad tooth and part of it broke and fell out a month or so ago  Pt states this week it has been sore but yesterday she started having facial swelling and an increase in pain

## 2017-05-29 NOTE — Discharge Instructions (Signed)
Please read attached information. If you experience any new or worsening signs or symptoms please return to the emergency room for evaluation. Please follow-up with your primary care provider or specialist as discussed. Please use medication prescribed only as directed and discontinue taking if you have any concerning signs or symptoms.   °

## 2017-06-06 ENCOUNTER — Emergency Department (HOSPITAL_COMMUNITY)
Admission: EM | Admit: 2017-06-06 | Discharge: 2017-06-06 | Disposition: A | Discharge status: Home / Self Care | Payer: No Typology Code available for payment source | Attending: Emergency Medicine | Admitting: Emergency Medicine

## 2017-06-06 ENCOUNTER — Encounter (HOSPITAL_COMMUNITY): Payer: Self-pay

## 2017-06-06 DIAGNOSIS — L03115 Cellulitis of right lower limb: Secondary | ICD-10-CM

## 2017-06-06 DIAGNOSIS — Z79899 Other long term (current) drug therapy: Secondary | ICD-10-CM | POA: Insufficient documentation

## 2017-06-06 DIAGNOSIS — Z791 Long term (current) use of non-steroidal anti-inflammatories (NSAID): Secondary | ICD-10-CM | POA: Insufficient documentation

## 2017-06-06 DIAGNOSIS — F17210 Nicotine dependence, cigarettes, uncomplicated: Secondary | ICD-10-CM | POA: Insufficient documentation

## 2017-06-06 DIAGNOSIS — B3789 Other sites of candidiasis: Secondary | ICD-10-CM | POA: Insufficient documentation

## 2017-06-06 LAB — CBG MONITORING, ED: GLUCOSE-CAPILLARY: 116 mg/dL — AB (ref 65–99)

## 2017-06-06 MED ORDER — SULFAMETHOXAZOLE-TRIMETHOPRIM 800-160 MG PO TABS: 1.0000 | ORAL_TABLET | Freq: Two times a day (BID) | ORAL | 0 refills | Status: AC

## 2017-06-06 MED ORDER — IBUPROFEN 200 MG PO TABS
600.0000 mg | ORAL_TABLET | Freq: Once | ORAL | Status: AC
  Administered 2017-06-06: 600 mg via ORAL
  Filled 2017-06-06: qty 3

## 2017-06-06 MED ORDER — VANCOMYCIN HCL 10 G IV SOLR
2000.0000 mg | Freq: Once | INTRAVENOUS | Status: AC
  Administered 2017-06-06: 2000 mg via INTRAVENOUS
  Filled 2017-06-06: qty 2000

## 2017-06-06 MED ORDER — KETOCONAZOLE 2 % EX CREA: 1.0000 "application " | TOPICAL_CREAM | Freq: Every day | CUTANEOUS | 0 refills | Status: DC

## 2017-06-06 MED ORDER — FLUCONAZOLE 150 MG PO TABS
150.0000 mg | ORAL_TABLET | Freq: Once | ORAL | Status: AC
  Administered 2017-06-06: 150 mg via ORAL
  Filled 2017-06-06: qty 1

## 2017-06-06 NOTE — ED Provider Notes (Signed)
[facility] DEPT Provider Note   CSN: [id number] Arrival date & time: 06/06/17  1339     History   Chief Complaint No chief complaint on file.   HPI [name] is a 51 y.o. female w PMHx polysubstance abuse, PTSD, PID, presenting with acute onset of swollen painful right foot that began last night. Pt states she was wearing new sandals yesterday and walked through the woods with a friend. She states at night she noticed her foot to be red, swollen and painful with a blister. She denies injecting IV drugs. She denies recent abx or new medications.  Also reports "chaffing" in her groin and external labia that has been ongoing and worsening. States it is worse with walking and movement. Denies vaginal bleeding or discharge. Denies F/C, N/V, abd pain.   The history is provided by the patient.    Past Medical History:  Diagnosis Date  . ADD (attention deficit disorder with hyperactivity)   . Bilateral ovarian cysts   . Chlamydia   . Depression   . PID (acute pelvic inflammatory disease)   . PTSD (post-traumatic stress disorder)   . Sciatica     Patient Active Problem List   Diagnosis Date Noted  . Heroin overdose 05/21/2017  . Acute respiratory failure ([place]) 05/21/2017  . Hypokalemia 05/21/2017  . Elevated LFTs 05/21/2017  . MDD (major depressive disorder), recurrent episode, severe ([place]) 04/09/2017  . Polysubstance (including opioids) dependence with physiological dependence ([place]) 04/09/2017  . Major depressive disorder, recurrent severe without psychotic features ([place]) 04/09/2017  . Chronic back pain 12/06/2013  . PTSD (post-traumatic stress disorder) 12/06/2013  . Other screening mammogram 12/06/2013  . ADD (attention deficit disorder) 12/06/2013    Past Surgical History:  Procedure Laterality Date  . ABDOMINAL HYSTERECTOMY    . BACK SURGERY     dbl lumbar fusion L5-S1  . BLADDER SURGERY    . LAPAROSCOPY    . mesh removel    . ROTATOR CUFF REPAIR Right  02/15/2013    OB History    No data available       Home Medications    Prior to Admission medications   Medication Sig Start Date End Date Taking? Authorizing Provider  acetaminophen (TYLENOL) 500 MG tablet Take 500 mg by mouth every 6 (six) hours as needed for moderate pain.    [provider]  amoxicillin-clavulanate (AUGMENTIN) 875-125 MG tablet Take 1 tablet by mouth every 12 (twelve) hours. 05/29/17   Hedges, Dellis Filbert, PA-C  gabapentin (NEURONTIN) 300 MG capsule Take 1 capsule (300 mg total) by mouth 2 (two) times daily. For agitation 04/15/17   Lindell Spar I, NP  ibuprofen (ADVIL,MOTRIN) 200 MG tablet Take 200 mg by mouth every 6 (six) hours as needed for moderate pain.    [provider]  ketoconazole (NIZORAL) 2 % cream Apply 1 application topically daily. 06/06/17   Russo, [place] N, PA-C  magic mouthwash w/lidocaine SOLN Take 5 mLs by mouth 3 (three) times daily as needed for mouth pain. 04/15/17   Lindell Spar I, NP  nicotine (NICODERM CQ - DOSED IN MG/24 HOURS) 21 mg/24hr patch Place 1 patch (21 mg total) onto the skin daily. For smoking cessation 04/16/17   Lindell Spar I, NP  sertraline (ZOLOFT) 100 MG tablet Take 2 tablets (200 mg total) by mouth daily. For depression 04/16/17   Lindell Spar I, NP  sulfamethoxazole-trimethoprim (BACTRIM DS,SEPTRA DS) 800-160 MG tablet Take 1 tablet by mouth 2 (two) times daily. 06/06/17 06/13/17  Russo, [place] N, PA-C  traZODone (DESYREL) 100 MG tablet Take 1 tablet (50 mg) at bedtime: For sleep 04/15/17   Encarnacion Slates, NP    Family History Family History  Problem Relation Age of Onset  . Hypertension Mother   . Heart failure Mother   . Hypertension Father   . Heart failure Father   . Hypertension Sister   . Cancer Other     Social History Social History  Substance Use Topics  . Smoking status: Current Every Day Smoker    Packs/day: 0.25    Years: 14.00    Types: Cigarettes  . Smokeless tobacco: Current User  .  Alcohol use No     Allergies   Imitrex [sumatriptan base]; Clarithromycin; Clindamycin/lincomycin; Morphine and related; and Vicodin [hydrocodone-acetaminophen]   Review of Systems Review of Systems  Constitutional: Negative for chills and fever.  HENT: Negative.   Eyes: Negative.   Respiratory: Negative for shortness of breath.   Cardiovascular: Negative for chest pain.  Gastrointestinal: Negative for abdominal pain, nausea and vomiting.  Genitourinary: Negative for dysuria, frequency, vaginal bleeding and vaginal discharge.  Musculoskeletal: Positive for myalgias. Negative for arthralgias.  Skin: Positive for color change and rash.  Neurological: Negative for headaches.     Physical Exam Updated Vital Signs BP 134/75   Pulse 87   Temp 98.3 F (36.8 C) (Oral)   Resp 18   Ht 5' 6.5" (1.689 m)   Wt 107 kg (236 lb)   SpO2 97%   BMI 37.52 kg/m   Physical Exam  Constitutional: She appears well-developed and well-nourished. No distress.  HENT:  Head: Normocephalic and atraumatic.  Mouth/Throat: Oropharynx is clear and moist.  Eyes: Conjunctivae are normal.  Neck: Normal range of motion.  Cardiovascular: Normal rate, regular rhythm, normal heart sounds and intact distal pulses.  Exam reveals no friction rub.   No murmur heard. Pulmonary/Chest: Effort normal and breath sounds normal. No respiratory distress. She has no wheezes. She has no rales.  Abdominal: Soft. Bowel sounds are normal. She exhibits no distension. There is no tenderness.  Musculoskeletal: Normal range of motion. She exhibits no deformity.  Ankle with nl ROM, non TTP  Neurological: She is alert.  Skin: Skin is warm.  Exam performed with chaperone present. Erythematous papular rash in groin and external labia, not vesicular or bulbous, no purulence or fluctuance. Right dorsal foot with erythema, tenderness and blister noted between base of 1st and 2nd toes. No purulence. See image. Patient also with  multiple superficial abrasions/scratches and ecchymosis over extremities, no purulence or surrounding erythema.  Psychiatric: She has a normal mood and affect. Her behavior is normal.  Nursing note and vitals reviewed.     ED Treatments / Results  Labs (all labs ordered are listed, but only abnormal results are displayed) Labs Reviewed  CBG MONITORING, ED - Abnormal; Notable for the following:       Result Value   Glucose-Capillary 116 (*)    All other components within normal limits  CBG MONITORING, ED    EKG  EKG Interpretation None       Radiology No results found.  Procedures Procedures (including critical care time)  Medications Ordered in ED Medications  ibuprofen (ADVIL,MOTRIN) tablet 600 mg (not administered)  fluconazole (DIFLUCAN) tablet 150 mg (150 mg Oral Given 06/06/17 1627)  vancomycin (VANCOCIN) 2,000 mg in sodium chloride 0.9 % 500 mL IVPB (2,000 mg Intravenous New Bag/Given 06/06/17 1646)     Initial Impression / Assessment  and Plan / ED Course  I have reviewed the triage vital signs and the nursing notes.  Pertinent labs & imaging results that were available during my care of the patient were reviewed by me and considered in my medical decision making (see chart for details).     Patient presenting with swelling and pain to right foot, consistent with cellulitis. Patient denies injecting IV drugs into her foot, and relates pain to wearing new sandals yesterday. Patient also with candidal infection in her groin. Patient is afebrile, not in distress. CBG 116.  IV vancomycin given in ED to treat cellulitis, and oral Diflucan for candidiasis. Will discharge with Bactrim and topical antifungal. Pt does not have PCP, recommend pt follow up with Urgent care or ER in 3 days for recheck of foot. Strict return precautions given. Pt is hemodynamically stable, safe for discharge.   Patient discussed with and seen by Dr. Lacinda Axon.  Discussed results, findings,  treatment and follow up. Patient advised of return precautions. Patient verbalized understanding and agreed with plan.  Final Clinical Impressions(s) / ED Diagnoses   Final diagnoses:  Cellulitis of right foot  Candida rash of groin    New Prescriptions New Prescriptions   KETOCONAZOLE (NIZORAL) 2 % CREAM    Apply 1 application topically daily.   SULFAMETHOXAZOLE-TRIMETHOPRIM (BACTRIM DS,SEPTRA DS) 800-160 MG TABLET    Take 1 tablet by mouth 2 (two) times daily.     Russo, [place] N, PA-C 06/06/17 Sherryl Manges, MD 06/06/17 2207

## 2017-06-06 NOTE — ED Notes (Signed)
Pt did not answer when called from lobby.

## 2017-06-06 NOTE — Progress Notes (Signed)
A consult was received from an ED physician for vancomycin per pharmacy dosing.  The patient's profile has been reviewed for ht/wt/allergies/indication/available labs.   A one time order has been placed for vancomycin 2g.  Further antibiotics/pharmacy consults should be ordered by admitting physician if indicated.                       Thank you, Hershal Coria, PharmD Pager: [phone] 06/06/2017

## 2017-06-06 NOTE — Discharge Instructions (Signed)
Please read instructions below. Begin taking the antibiotic, Bactrim, 2 times per day until gone. Apply the ketoconazole cream to your rash once per day. Wear loose clothing and avoid prolonged exposure and heat to help this rash. You can take Advil every 6 hours as needed for pain. Report to urgent care or the ER to recheck your foot in 3 days. Return to the ER sooner for fever, chills, nausea, vomiting, or new or concerning symptoms.

## 2017-06-06 NOTE — ED Triage Notes (Signed)
Patient here with c/o right foot pain. Pt right foot is red and swollen upon assessment and also blisters. Pt state this just happen yesterday evening.

## 2017-06-06 NOTE — ED Notes (Addendum)
No answer when called for triage 

## 2017-06-06 NOTE — ED Notes (Signed)
Pt falling asleep multiple times during triage.

## 2017-06-06 NOTE — ED Notes (Signed)
Pt asked for MD Lacinda Axon to check her outer vaginal area d/t itching & pain.  RN Learta Codding at bedside supervising.  Blotchy red spots around outer labia.

## 2017-09-09 ENCOUNTER — Encounter (HOSPITAL_COMMUNITY): Payer: Self-pay | Admitting: Emergency Medicine

## 2017-09-09 ENCOUNTER — Other Ambulatory Visit: Payer: Self-pay

## 2017-09-09 DIAGNOSIS — M62830 Muscle spasm of back: Secondary | ICD-10-CM | POA: Insufficient documentation

## 2017-09-09 DIAGNOSIS — F17210 Nicotine dependence, cigarettes, uncomplicated: Secondary | ICD-10-CM | POA: Insufficient documentation

## 2017-09-09 DIAGNOSIS — M549 Dorsalgia, unspecified: Secondary | ICD-10-CM | POA: Insufficient documentation

## 2017-09-09 DIAGNOSIS — R2240 Localized swelling, mass and lump, unspecified lower limb: Secondary | ICD-10-CM | POA: Insufficient documentation

## 2017-09-09 DIAGNOSIS — E876 Hypokalemia: Secondary | ICD-10-CM | POA: Insufficient documentation

## 2017-09-09 DIAGNOSIS — N390 Urinary tract infection, site not specified: Secondary | ICD-10-CM | POA: Insufficient documentation

## 2017-09-09 NOTE — ED Triage Notes (Signed)
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

## 2017-09-09 NOTE — ED Triage Notes (Signed)
Pt reports that her lower legs have been swelling for the past 3 or 4 days  Pt has pitting edema noted

## 2017-09-10 ENCOUNTER — Emergency Department (HOSPITAL_COMMUNITY)
Admission: EM | Admit: 2017-09-10 | Discharge: 2017-09-10 | Disposition: A | Discharge status: Home / Self Care | Payer: No Typology Code available for payment source | Attending: Emergency Medicine | Admitting: Emergency Medicine

## 2017-09-10 DIAGNOSIS — M546 Pain in thoracic spine: Secondary | ICD-10-CM

## 2017-09-10 DIAGNOSIS — R8281 Pyuria: Secondary | ICD-10-CM

## 2017-09-10 DIAGNOSIS — E876 Hypokalemia: Secondary | ICD-10-CM

## 2017-09-10 DIAGNOSIS — M62838 Other muscle spasm: Secondary | ICD-10-CM

## 2017-09-10 LAB — URINALYSIS, ROUTINE W REFLEX MICROSCOPIC
BILIRUBIN URINE: NEGATIVE
Glucose, UA: NEGATIVE mg/dL
KETONES UR: NEGATIVE mg/dL
Nitrite: NEGATIVE
PH: 5 (ref 5.0–8.0)
PROTEIN: 30 mg/dL — AB
SPECIFIC GRAVITY, URINE: 1.017 (ref 1.005–1.030)

## 2017-09-10 LAB — MAGNESIUM: Magnesium: 1.9 mg/dL (ref 1.7–2.4)

## 2017-09-10 LAB — COMPREHENSIVE METABOLIC PANEL
ALT: 28 U/L (ref 14–54)
AST: 32 U/L (ref 15–41)
Albumin: 3 g/dL — ABNORMAL LOW (ref 3.5–5.0)
Alkaline Phosphatase: 84 U/L (ref 38–126)
Anion gap: 10 (ref 5–15)
BUN: 9 mg/dL (ref 6–20)
CHLORIDE: 98 mmol/L — AB (ref 101–111)
CO2: 24 mmol/L (ref 22–32)
CREATININE: 0.88 mg/dL (ref 0.44–1.00)
Calcium: 8 mg/dL — ABNORMAL LOW (ref 8.9–10.3)
Glucose, Bld: 111 mg/dL — ABNORMAL HIGH (ref 65–99)
POTASSIUM: 2.6 mmol/L — AB (ref 3.5–5.1)
SODIUM: 132 mmol/L — AB (ref 135–145)
Total Bilirubin: 1.1 mg/dL (ref 0.3–1.2)
Total Protein: 6.6 g/dL (ref 6.5–8.1)

## 2017-09-10 LAB — GC/CHLAMYDIA PROBE AMP (~~LOC~~) NOT AT ARMC
Chlamydia: NEGATIVE
Neisseria Gonorrhea: NEGATIVE

## 2017-09-10 LAB — WET PREP, GENITAL
Clue Cells Wet Prep HPF POC: NONE SEEN
SPERM: NONE SEEN
TRICH WET PREP: NONE SEEN
YEAST WET PREP: NONE SEEN

## 2017-09-10 LAB — CBC WITH DIFFERENTIAL/PLATELET
BASOS ABS: 0 10*3/uL (ref 0.0–0.1)
Basophils Relative: 0 %
EOS ABS: 0.1 10*3/uL (ref 0.0–0.7)
EOS PCT: 1 %
HCT: 34.3 % — ABNORMAL LOW (ref 36.0–46.0)
Hemoglobin: 12.2 g/dL (ref 12.0–15.0)
Lymphocytes Relative: 8 %
Lymphs Abs: 1.5 10*3/uL (ref 0.7–4.0)
MCH: 31.6 pg (ref 26.0–34.0)
MCHC: 35.6 g/dL (ref 30.0–36.0)
MCV: 88.9 fL (ref 78.0–100.0)
Monocytes Absolute: 1.3 10*3/uL — ABNORMAL HIGH (ref 0.1–1.0)
Monocytes Relative: 7 %
Neutro Abs: 16 10*3/uL — ABNORMAL HIGH (ref 1.7–7.7)
Neutrophils Relative %: 84 %
PLATELETS: 334 10*3/uL (ref 150–400)
RBC: 3.86 MIL/uL — AB (ref 3.87–5.11)
RDW: 13.5 % (ref 11.5–15.5)
WBC: 18.9 10*3/uL — AB (ref 4.0–10.5)

## 2017-09-10 MED ORDER — METHOCARBAMOL 500 MG PO TABS: 500.0000 mg | ORAL_TABLET | Freq: Three times a day (TID) | ORAL | 0 refills | Status: DC | PRN

## 2017-09-10 MED ORDER — POTASSIUM CHLORIDE CRYS ER 20 MEQ PO TBCR
40.0000 meq | EXTENDED_RELEASE_TABLET | Freq: Once | ORAL | Status: AC
  Administered 2017-09-10: 40 meq via ORAL
  Filled 2017-09-10: qty 2

## 2017-09-10 MED ORDER — POTASSIUM CHLORIDE 10 MEQ/100ML IV SOLN
10.0000 meq | INTRAVENOUS | Status: AC
  Administered 2017-09-10 (×2): 10 meq via INTRAVENOUS
  Filled 2017-09-10 (×2): qty 100

## 2017-09-10 MED ORDER — KETOROLAC TROMETHAMINE 60 MG/2ML IM SOLN
60.0000 mg | Freq: Once | INTRAMUSCULAR | Status: AC
  Administered 2017-09-10: 60 mg via INTRAMUSCULAR
  Filled 2017-09-10: qty 2

## 2017-09-10 MED ORDER — DEXTROSE 5 % IV SOLN
500.0000 mg | Freq: Once | INTRAVENOUS | Status: AC
  Administered 2017-09-10: 500 mg via INTRAVENOUS
  Filled 2017-09-10: qty 550

## 2017-09-10 MED ORDER — POTASSIUM CHLORIDE ER 10 MEQ PO TBCR: 20.0000 meq | EXTENDED_RELEASE_TABLET | Freq: Two times a day (BID) | ORAL | 0 refills | Status: DC

## 2017-09-10 MED ORDER — LIDOCAINE 5 % EX PTCH
1.0000 | MEDICATED_PATCH | CUTANEOUS | Status: DC
  Administered 2017-09-10: 1 via TRANSDERMAL
  Filled 2017-09-10: qty 1

## 2017-09-10 MED ORDER — CEPHALEXIN 500 MG PO CAPS: 500.0000 mg | ORAL_CAPSULE | Freq: Two times a day (BID) | ORAL | 0 refills | Status: DC

## 2017-09-10 MED ORDER — DOXYCYCLINE HYCLATE 100 MG PO CAPS: 100.0000 mg | ORAL_CAPSULE | Freq: Two times a day (BID) | ORAL | 0 refills | Status: DC

## 2017-09-10 NOTE — ED Provider Notes (Signed)
[facility] DEPT Provider Note   CSN: [id number] Arrival date & time: 09/09/17  2340     History   Chief Complaint Chief Complaint  Patient presents with  . Back Pain  . Leg Swelling    HPI [name] is a 51 y.o. female.  51 year old female with a history of PTSD, ADD, and depression presents to the ED for c/o back pain. Patient states that she has been experiencing spasms to her back. Pain will radiate down her legs and is aggravated with movement. 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 She denies any trauma, injury or falls. Patient further reports that the pain will wax and wane in severity, sometimes causing her to catch her breath. She further notes some spasms in her bilateral lower extremities. She has tried OTC medications without relief. She has not has any known fevers, urinary symptoms, vaginal complaints, incontinence. She has hx of back surgeries x 2. She has not been sexually active with any partners "for many years".   The history is provided by the patient. No language interpreter was used.  Back Pain      Past Medical History:  Diagnosis Date  . ADD (attention deficit disorder with hyperactivity)   . Bilateral ovarian cysts   . Chlamydia   . Depression   . PID (acute pelvic inflammatory disease)   . PTSD (post-traumatic stress disorder)   . Sciatica     Patient Active Problem List   Diagnosis Date Noted  . Heroin overdose ([place]) 05/21/2017  . Acute respiratory failure ([place]) 05/21/2017  . Hypokalemia 05/21/2017  . Elevated LFTs 05/21/2017  . MDD (major depressive disorder), recurrent episode, severe ([place]) 04/09/2017  . Polysubstance (including opioids) dependence with physiological dependence ([place]) 04/09/2017  . Major depressive disorder, recurrent severe without psychotic features ([place]) 04/09/2017  . Chronic back pain 12/06/2013  . PTSD (post-traumatic stress  disorder) 12/06/2013  . Other screening mammogram 12/06/2013  . ADD (attention deficit disorder) 12/06/2013    Past Surgical History:  Procedure Laterality Date  . ABDOMINAL HYSTERECTOMY    . BACK SURGERY     dbl lumbar fusion L5-S1  . BLADDER SURGERY    . LAPAROSCOPY    . mesh removel    . ROTATOR CUFF REPAIR Right 02/15/2013    OB History    No data available       Home Medications    Prior to Admission medications   Medication Sig Start Date End Date Taking? Authorizing Provider  acetaminophen (TYLENOL) 500 MG tablet Take 500 mg by mouth every 6 (six) hours as needed for moderate pain.   Yes [provider]  ibuprofen (ADVIL,MOTRIN) 200 MG tablet Take 200 mg by mouth every 6 (six) hours as needed for moderate pain.   Yes [provider]  cephALEXin (KEFLEX) 500 MG capsule Take 1 capsule (500 mg total) 2 (two) times daily by mouth. 09/10/17   Antonietta Breach, PA-C  doxycycline (VIBRAMYCIN) 100 MG capsule Take 1 capsule (100 mg total) 2 (two) times daily by mouth. 09/10/17   Antonietta Breach, PA-C  methocarbamol (ROBAXIN) 500 MG tablet Take 1 tablet (500 mg total) every 8 (eight) hours as needed by mouth for muscle spasms. 09/10/17   Antonietta Breach, PA-C  potassium chloride (K-DUR) 10 MEQ tablet Take 2 tablets (20 mEq total) 2 (two) times daily by mouth. 09/10/17   Antonietta Breach, PA-C    Family History Family History  Problem Relation Age of Onset  . Hypertension Mother   . Heart failure Mother   . Hypertension Father   . Heart failure Father   . Hypertension Sister   . Cancer Other     Social History Social History   Tobacco Use  . Smoking status: Current Every Day Smoker    Packs/day: 0.25    Years: 14.00    Pack years: 3.50    Types: Cigarettes  . Smokeless tobacco: Current User  Substance Use Topics  . Alcohol use: No  . Drug use: No    Comment: former     Allergies   Imitrex [sumatriptan base]; Clarithromycin; Clindamycin/lincomycin;  Morphine and related; and Vicodin [hydrocodone-acetaminophen]   Review of Systems Review of Systems  Musculoskeletal: Positive for back pain.  Ten systems reviewed and are negative for acute change, except as noted in the HPI.    Physical Exam Updated Vital Signs BP 105/71 (BP Location: Left Arm)   Pulse 98   Temp 98.4 F (36.9 C) (Oral)   Resp 17   Ht 5\' 6"  (1.676 m)   Wt 113.4 kg (250 lb)   SpO2 98%   BMI 40.35 kg/m   Physical Exam  Constitutional: She is oriented to person, place, and time. She appears well-developed and well-nourished. No distress.  Nontoxic and in NAD  HENT:  Head: Normocephalic and atraumatic.  Eyes: Conjunctivae and EOM are normal. No scleral icterus.  Neck: Normal range of motion.  Cardiovascular: Normal rate, regular rhythm and intact distal pulses.  Pulmonary/Chest: Effort normal. No stridor. No respiratory distress.  Respirations even and unlabored  Genitourinary: There is no rash, tenderness or lesion on the right labia. There is no rash, tenderness or lesion on the left labia. Vaginal discharge (small amount, yellow) found.    Musculoskeletal: Normal range of motion. She exhibits tenderness.  TTP to the lower thoracic paraspinal muscles. No bony deformities, step-offs, or crepitus to the thoracic or lumbosacral midline. Normal ROM of the L knee. Trace edema in BLE, non-pitting.  Neurological: She is alert and oriented to person, place, and time. She exhibits normal muscle tone. Coordination normal.  Patient moving all extremities.  Skin: Skin is warm and dry. No rash noted. She is not diaphoretic. No erythema. No pallor.  Psychiatric: She has a normal mood and affect. Her behavior is normal.  Nursing note and vitals reviewed.    ED Treatments / Results  Labs (all labs ordered are listed, but only abnormal results are displayed) Labs Reviewed  WET PREP, GENITAL - Abnormal; Notable for the following components:      Result Value   WBC,  Wet Prep HPF POC FEW (*)    All other components within normal limits  URINALYSIS, ROUTINE W REFLEX MICROSCOPIC - Abnormal; Notable for the following components:   Color, Urine AMBER (*)    APPearance HAZY (*)    Hgb urine dipstick MODERATE (*)    Protein, ur 30 (*)    Leukocytes, UA LARGE (*)    Bacteria, UA RARE (*)    Squamous Epithelial / LPF 6-30 (*)    All other components within normal limits  CBC WITH DIFFERENTIAL/PLATELET - Abnormal; Notable for the following components:   WBC 18.9 (*)    RBC 3.86 (*)    HCT 34.3 (*)    Neutro Abs 16.0 (*)    Monocytes Absolute 1.3 (*)    All other components within normal limits  COMPREHENSIVE METABOLIC PANEL - Abnormal; Notable for  the following components:   Sodium 132 (*)    Potassium 2.6 (*)    Chloride 98 (*)    Glucose, Bld 111 (*)    Calcium 8.0 (*)    Albumin 3.0 (*)    All other components within normal limits  URINE CULTURE  MAGNESIUM  GC/CHLAMYDIA PROBE AMP ([place]) NOT AT [facility]    EKG  EKG Interpretation  Date/Time:  Friday September 10 2017 04:02:04 EST Ventricular Rate:  90 PR Interval:    QRS Duration: 107 QT Interval:  418 QTC Calculation: 512 R Axis:   68 Text Interpretation:  Sinus rhythm RSR' in V1 or V2, right VCD or RVH Borderline T abnormalities, anterior leads Prolonged QT interval Confirmed by Quintella Reichert [phone]) on 09/11/2017 7:25:47 PM       Radiology No results found.  Procedures Procedures (including critical care time)  Medications Ordered in ED Medications  lidocaine (LIDODERM) 5 % 1 patch (1 patch Transdermal Patch Applied 09/10/17 0138)  ketorolac (TORADOL) injection 60 mg (60 mg Intramuscular Given 09/10/17 0139)  methocarbamol (ROBAXIN) 500 mg in dextrose 5 % 50 mL IVPB (0 mg Intravenous Stopped 09/10/17 0350)  potassium chloride SA (K-DUR,KLOR-CON) CR tablet 40 mEq (40 mEq Oral Given 09/10/17 0404)  potassium chloride 10 mEq in 100 mL IVPB (0 mEq Intravenous Stopped 09/10/17  0512)     Initial Impression / Assessment and Plan / ED Course  I have reviewed the triage vital signs and the nursing notes.  Pertinent labs & imaging results that were available during my care of the patient were reviewed by me and considered in my medical decision making (see chart for details).     Patient presenting for back pain. She notes onset after construction work, though she is unsure of any specific injury. She has not had any falls or trauma. Patient reporting history of back surgeries x 2. She is neurovascularly intact on exam and moving all extremities. Patient afebrile and without hx of incontinence. Labs and UA were obtained which noted significant pyuria. Patient denies urinary symptoms; a pelvic exam was performed for this reason which was reassuring.  Leukocytosis with left shift suspected secondary to UTI. Plan for treatment with Keflex for this. Patient was also hypokalemic while in the ED. She has a hx of this in June. Hypocalcemia seems chronic; corrected calcium 8.8.   Patient given symptomatic management for back pain. She appears more comfortable on repeat assessment. Potassium repleted in the ED. I have advised continued follow up and recheck of symptoms by her PCP. In the absence of trauma, I do not believe additional imaging is currently indicated. UTI may also be progressing to a kidney infection which could account for degree of discomfort. No signs concerning for cauda equina. Return precautions discussed and provided. Patient discharged in stable condition with no unaddressed concerns.   Vitals:   09/10/17 0414 09/10/17 0512 09/10/17 0530 09/10/17 0615  BP:  105/71 99/70 99/67   Pulse: 84 98  82  Resp: 19 17  17   Temp:      TempSrc:      SpO2: 100% 98%  98%  Weight:      Height:         Final Clinical Impressions(s) / ED Diagnoses   Final diagnoses:  Acute thoracic back pain, unspecified back pain laterality  Other muscle spasm  Hypokalemia    Pyuria    ED Discharge Orders        Ordered    potassium  chloride (K-DUR) 10 MEQ tablet  2 times daily     09/10/17 0551    methocarbamol (ROBAXIN) 500 MG tablet  Every 8 hours PRN     09/10/17 0551    cephALEXin (KEFLEX) 500 MG capsule  2 times daily     09/10/17 0551    doxycycline (VIBRAMYCIN) 100 MG capsule  2 times daily     09/10/17 0551       Antonietta Breach, PA-C 09/14/17 0514    Antonietta Breach, PA-C 09/14/17 Tresa Endo, April, MD 09/14/17 Delrae Rend

## 2017-09-10 NOTE — Discharge Instructions (Addendum)
Your potassium was low today.  We recommend that you take potassium as prescribed for the next 5 days.  You may take Robaxin as needed for persistent back pain and spasms.  You were also found to have white blood cells in your urine which may be indicative of infection.  For this reason, take Keflex and doxycycline as prescribed until finished.  We recommend close follow-up with your primary care doctor regarding your ED visit today.  We also recommend close follow up with an OBGYN. You may return for new or concerning symptoms.

## 2017-09-10 NOTE — ED Provider Notes (Signed)
Medical screening examination/treatment/procedure(s) were performed by non-physician practitioner and as supervising physician I was immediately available for consultation/collaboration.   EKG Interpretation None        Xiana Carns, MD 09/10/17 [phone]

## 2017-09-11 ENCOUNTER — Other Ambulatory Visit: Payer: Self-pay

## 2017-09-11 ENCOUNTER — Emergency Department (HOSPITAL_COMMUNITY): Payer: Self-pay

## 2017-09-11 ENCOUNTER — Inpatient Hospital Stay (HOSPITAL_COMMUNITY): Payer: Self-pay

## 2017-09-11 ENCOUNTER — Encounter (HOSPITAL_COMMUNITY): Payer: Self-pay | Admitting: Emergency Medicine

## 2017-09-11 ENCOUNTER — Inpatient Hospital Stay (HOSPITAL_COMMUNITY)
Admission: EM | Admit: 2017-09-11 | Discharge: 2017-11-14 | DRG: 853 | Disposition: A | Discharge status: Home / Self Care | Payer: Self-pay | Attending: Internal Medicine | Admitting: Internal Medicine

## 2017-09-11 ENCOUNTER — Other Ambulatory Visit: Payer: Self-pay | Admitting: Oncology

## 2017-09-11 DIAGNOSIS — G061 Intraspinal abscess and granuloma: Secondary | ICD-10-CM | POA: Diagnosis present

## 2017-09-11 DIAGNOSIS — R339 Retention of urine, unspecified: Secondary | ICD-10-CM | POA: Diagnosis not present

## 2017-09-11 DIAGNOSIS — M25462 Effusion, left knee: Secondary | ICD-10-CM | POA: Diagnosis present

## 2017-09-11 DIAGNOSIS — R400 Somnolence: Secondary | ICD-10-CM | POA: Diagnosis present

## 2017-09-11 DIAGNOSIS — R609 Edema, unspecified: Secondary | ICD-10-CM

## 2017-09-11 DIAGNOSIS — B1920 Unspecified viral hepatitis C without hepatic coma: Secondary | ICD-10-CM | POA: Diagnosis present

## 2017-09-11 DIAGNOSIS — F1110 Opioid abuse, uncomplicated: Secondary | ICD-10-CM | POA: Diagnosis present

## 2017-09-11 DIAGNOSIS — Z881 Allergy status to other antibiotic agents status: Secondary | ICD-10-CM

## 2017-09-11 DIAGNOSIS — M549 Dorsalgia, unspecified: Secondary | ICD-10-CM | POA: Diagnosis present

## 2017-09-11 DIAGNOSIS — D72828 Other elevated white blood cell count: Secondary | ICD-10-CM

## 2017-09-11 DIAGNOSIS — Z79899 Other long term (current) drug therapy: Secondary | ICD-10-CM

## 2017-09-11 DIAGNOSIS — R197 Diarrhea, unspecified: Secondary | ICD-10-CM | POA: Diagnosis not present

## 2017-09-11 DIAGNOSIS — R600 Localized edema: Secondary | ICD-10-CM | POA: Diagnosis present

## 2017-09-11 DIAGNOSIS — N83201 Unspecified ovarian cyst, right side: Secondary | ICD-10-CM | POA: Diagnosis present

## 2017-09-11 DIAGNOSIS — D72829 Elevated white blood cell count, unspecified: Secondary | ICD-10-CM

## 2017-09-11 DIAGNOSIS — E66813 Obesity, class 3: Secondary | ICD-10-CM | POA: Diagnosis present

## 2017-09-11 DIAGNOSIS — R591 Generalized enlarged lymph nodes: Secondary | ICD-10-CM | POA: Diagnosis present

## 2017-09-11 DIAGNOSIS — G894 Chronic pain syndrome: Secondary | ICD-10-CM | POA: Diagnosis present

## 2017-09-11 DIAGNOSIS — B9561 Methicillin susceptible Staphylococcus aureus infection as the cause of diseases classified elsewhere: Secondary | ICD-10-CM | POA: Diagnosis present

## 2017-09-11 DIAGNOSIS — Z419 Encounter for procedure for purposes other than remedying health state, unspecified: Secondary | ICD-10-CM

## 2017-09-11 DIAGNOSIS — M5430 Sciatica, unspecified side: Secondary | ICD-10-CM | POA: Diagnosis present

## 2017-09-11 DIAGNOSIS — D509 Iron deficiency anemia, unspecified: Secondary | ICD-10-CM | POA: Diagnosis present

## 2017-09-11 DIAGNOSIS — R6520 Severe sepsis without septic shock: Secondary | ICD-10-CM | POA: Diagnosis present

## 2017-09-11 DIAGNOSIS — R52 Pain, unspecified: Secondary | ICD-10-CM

## 2017-09-11 DIAGNOSIS — Z6841 Body Mass Index (BMI) 40.0 and over, adult: Secondary | ICD-10-CM

## 2017-09-11 DIAGNOSIS — J9859 Other diseases of mediastinum, not elsewhere classified: Secondary | ICD-10-CM | POA: Diagnosis present

## 2017-09-11 DIAGNOSIS — N139 Obstructive and reflux uropathy, unspecified: Secondary | ICD-10-CM | POA: Diagnosis present

## 2017-09-11 DIAGNOSIS — F17210 Nicotine dependence, cigarettes, uncomplicated: Secondary | ICD-10-CM | POA: Diagnosis present

## 2017-09-11 DIAGNOSIS — A4101 Sepsis due to Methicillin susceptible Staphylococcus aureus: Principal | ICD-10-CM | POA: Diagnosis present

## 2017-09-11 DIAGNOSIS — Z888 Allergy status to other drugs, medicaments and biological substances status: Secondary | ICD-10-CM

## 2017-09-11 DIAGNOSIS — R7881 Bacteremia: Secondary | ICD-10-CM

## 2017-09-11 DIAGNOSIS — A419 Sepsis, unspecified organism: Secondary | ICD-10-CM | POA: Diagnosis present

## 2017-09-11 DIAGNOSIS — M542 Cervicalgia: Secondary | ICD-10-CM

## 2017-09-11 DIAGNOSIS — Z90710 Acquired absence of both cervix and uterus: Secondary | ICD-10-CM

## 2017-09-11 DIAGNOSIS — I959 Hypotension, unspecified: Secondary | ICD-10-CM | POA: Diagnosis present

## 2017-09-11 DIAGNOSIS — R590 Localized enlarged lymph nodes: Secondary | ICD-10-CM | POA: Diagnosis present

## 2017-09-11 DIAGNOSIS — E876 Hypokalemia: Secondary | ICD-10-CM | POA: Diagnosis present

## 2017-09-11 DIAGNOSIS — K5900 Constipation, unspecified: Secondary | ICD-10-CM | POA: Diagnosis not present

## 2017-09-11 DIAGNOSIS — Z981 Arthrodesis status: Secondary | ICD-10-CM

## 2017-09-11 DIAGNOSIS — Z885 Allergy status to narcotic agent status: Secondary | ICD-10-CM

## 2017-09-11 DIAGNOSIS — R748 Abnormal levels of other serum enzymes: Secondary | ICD-10-CM | POA: Diagnosis present

## 2017-09-11 DIAGNOSIS — F909 Attention-deficit hyperactivity disorder, unspecified type: Secondary | ICD-10-CM | POA: Diagnosis present

## 2017-09-11 DIAGNOSIS — R9389 Abnormal findings on diagnostic imaging of other specified body structures: Secondary | ICD-10-CM

## 2017-09-11 DIAGNOSIS — F4310 Post-traumatic stress disorder, unspecified: Secondary | ICD-10-CM | POA: Diagnosis present

## 2017-09-11 DIAGNOSIS — R599 Enlarged lymph nodes, unspecified: Secondary | ICD-10-CM

## 2017-09-11 DIAGNOSIS — E871 Hypo-osmolality and hyponatremia: Secondary | ICD-10-CM | POA: Diagnosis present

## 2017-09-11 DIAGNOSIS — F329 Major depressive disorder, single episode, unspecified: Secondary | ICD-10-CM | POA: Diagnosis present

## 2017-09-11 HISTORY — DX: Morbid (severe) obesity due to excess calories: E66.01

## 2017-09-11 LAB — URINALYSIS, ROUTINE W REFLEX MICROSCOPIC
Bilirubin Urine: NEGATIVE
Glucose, UA: NEGATIVE mg/dL
Ketones, ur: NEGATIVE mg/dL
Nitrite: NEGATIVE
Protein, ur: NEGATIVE mg/dL
Specific Gravity, Urine: 1.02 (ref 1.005–1.030)
pH: 6 (ref 5.0–8.0)

## 2017-09-11 LAB — CBC WITH DIFFERENTIAL/PLATELET
Basophils Absolute: 0 10*3/uL (ref 0.0–0.1)
Basophils Relative: 0 %
Eosinophils Absolute: 0.1 10*3/uL (ref 0.0–0.7)
Eosinophils Relative: 1 %
HCT: 34.2 % — ABNORMAL LOW (ref 36.0–46.0)
Hemoglobin: 12.1 g/dL (ref 12.0–15.0)
Lymphocytes Relative: 6 %
Lymphs Abs: 1.1 10*3/uL (ref 0.7–4.0)
MCH: 31.6 pg (ref 26.0–34.0)
MCHC: 35.4 g/dL (ref 30.0–36.0)
MCV: 89.3 fL (ref 78.0–100.0)
Monocytes Absolute: 1.6 10*3/uL — ABNORMAL HIGH (ref 0.1–1.0)
Monocytes Relative: 9 %
Neutro Abs: 14.4 10*3/uL — ABNORMAL HIGH (ref 1.7–7.7)
Neutrophils Relative %: 84 %
Platelets: 383 10*3/uL (ref 150–400)
RBC: 3.83 MIL/uL — ABNORMAL LOW (ref 3.87–5.11)
RDW: 13.4 % (ref 11.5–15.5)
WBC: 17.1 10*3/uL — ABNORMAL HIGH (ref 4.0–10.5)

## 2017-09-11 LAB — MRSA PCR SCREENING: MRSA BY PCR: NEGATIVE

## 2017-09-11 LAB — BASIC METABOLIC PANEL
Anion gap: 7 (ref 5–15)
BUN: 9 mg/dL (ref 6–20)
CO2: 30 mmol/L (ref 22–32)
Calcium: 8.3 mg/dL — ABNORMAL LOW (ref 8.9–10.3)
Chloride: 94 mmol/L — ABNORMAL LOW (ref 101–111)
Creatinine, Ser: 0.8 mg/dL (ref 0.44–1.00)
GFR calc Af Amer: >60 mL/min (ref >60)
GFR calc non Af Amer: >60 mL/min (ref >60)
Glucose, Bld: 109 mg/dL — ABNORMAL HIGH (ref 65–99)
Potassium: 2.9 mmol/L — ABNORMAL LOW (ref 3.5–5.1)
Sodium: 131 mmol/L — ABNORMAL LOW (ref 135–145)

## 2017-09-11 LAB — URINE CULTURE

## 2017-09-11 LAB — RAPID URINE DRUG SCREEN, HOSP PERFORMED
Amphetamines: POSITIVE — AB
BENZODIAZEPINES: POSITIVE — AB
Barbiturates: NOT DETECTED
Cocaine: NOT DETECTED
Opiates: NOT DETECTED
Tetrahydrocannabinol: NOT DETECTED

## 2017-09-11 LAB — SEDIMENTATION RATE: Sed Rate: 80 mm/hr — ABNORMAL HIGH (ref 0–22)

## 2017-09-11 LAB — SAVE SMEAR

## 2017-09-11 LAB — LACTATE DEHYDROGENASE: LDH: 202 U/L — AB (ref 98–192)

## 2017-09-11 LAB — CK: Total CK: 198 U/L (ref 38–234)

## 2017-09-11 LAB — C-REACTIVE PROTEIN: CRP: 21.7 mg/dL — ABNORMAL HIGH (ref <1.0)

## 2017-09-11 LAB — LACTIC ACID, PLASMA: LACTIC ACID, VENOUS: 1.3 mmol/L (ref 0.5–1.9)

## 2017-09-11 LAB — MAGNESIUM: Magnesium: 1.8 mg/dL (ref 1.7–2.4)

## 2017-09-11 MED ORDER — VANCOMYCIN HCL 10 G IV SOLR
2000.0000 mg | Freq: Once | INTRAVENOUS | Status: AC
  Administered 2017-09-11: 2000 mg via INTRAVENOUS
  Filled 2017-09-11: qty 2000

## 2017-09-11 MED ORDER — METHOCARBAMOL 1000 MG/10ML IJ SOLN
500.0000 mg | Freq: Once | INTRAVENOUS | Status: AC
  Administered 2017-09-11: 500 mg via INTRAVENOUS
  Filled 2017-09-11: qty 550

## 2017-09-11 MED ORDER — LORAZEPAM 2 MG/ML IJ SOLN
0.5000 mg | Freq: Once | INTRAMUSCULAR | Status: AC
  Administered 2017-09-11: 0.5 mg via INTRAVENOUS

## 2017-09-11 MED ORDER — POTASSIUM CHLORIDE IN NACL 20-0.9 MEQ/L-% IV SOLN
INTRAVENOUS | Status: DC
  Administered 2017-09-11 – 2017-09-19 (×7): via INTRAVENOUS
  Filled 2017-09-11 (×11): qty 1000

## 2017-09-11 MED ORDER — ACETAMINOPHEN 325 MG PO TABS
650.0000 mg | ORAL_TABLET | Freq: Four times a day (QID) | ORAL | Status: DC | PRN
  Administered 2017-09-11 – 2017-11-14 (×64): 650 mg via ORAL
  Filled 2017-09-11 (×65): qty 2

## 2017-09-11 MED ORDER — LORAZEPAM 2 MG/ML IJ SOLN
INTRAMUSCULAR | Status: AC
  Filled 2017-09-11: qty 1

## 2017-09-11 MED ORDER — METHOCARBAMOL 1000 MG/10ML IJ SOLN: 500.0000 mg | Freq: Once | INTRAMUSCULAR | Status: DC

## 2017-09-11 MED ORDER — LACTATED RINGERS IV BOLUS (SEPSIS)
1000.0000 mL | Freq: Once | INTRAVENOUS | Status: AC
  Administered 2017-09-11: 1000 mL via INTRAVENOUS

## 2017-09-11 MED ORDER — PIPERACILLIN-TAZOBACTAM 3.375 G IVPB: 3.3750 g | Freq: Three times a day (TID) | INTRAVENOUS | Status: DC

## 2017-09-11 MED ORDER — POTASSIUM CHLORIDE CRYS ER 20 MEQ PO TBCR
40.0000 meq | EXTENDED_RELEASE_TABLET | Freq: Once | ORAL | Status: DC
  Filled 2017-09-11: qty 2

## 2017-09-11 MED ORDER — SODIUM CHLORIDE 0.9 % IV BOLUS (SEPSIS)
500.0000 mL | Freq: Once | INTRAVENOUS | Status: AC
  Administered 2017-09-11: 500 mL via INTRAVENOUS

## 2017-09-11 MED ORDER — IOPAMIDOL (ISOVUE-300) INJECTION 61%
INTRAVENOUS | Status: AC
  Filled 2017-09-11: qty 75

## 2017-09-11 MED ORDER — GADOBENATE DIMEGLUMINE 529 MG/ML IV SOLN
20.0000 mL | Freq: Once | INTRAVENOUS | Status: AC | PRN
  Administered 2017-09-11: 20 mL via INTRAVENOUS

## 2017-09-11 MED ORDER — ACETAMINOPHEN 650 MG RE SUPP
650.0000 mg | Freq: Four times a day (QID) | RECTAL | Status: DC | PRN
Start: 2017-09-11 — End: 2017-11-14

## 2017-09-11 MED ORDER — DEXTROSE 5 % IV SOLN
2.0000 g | Freq: Once | INTRAVENOUS | Status: AC
  Administered 2017-09-11: 2 g via INTRAVENOUS
  Filled 2017-09-11: qty 2

## 2017-09-11 MED ORDER — LORAZEPAM 2 MG/ML IJ SOLN
1.0000 mg | Freq: Once | INTRAMUSCULAR | Status: AC
  Administered 2017-09-11: 1 mg via INTRAVENOUS
  Filled 2017-09-11: qty 1

## 2017-09-11 MED ORDER — ONDANSETRON HCL 4 MG PO TABS: 4.0000 mg | ORAL_TABLET | Freq: Four times a day (QID) | ORAL | Status: DC | PRN

## 2017-09-11 MED ORDER — IOPAMIDOL (ISOVUE-300) INJECTION 61%
75.0000 mL | Freq: Once | INTRAVENOUS | Status: AC | PRN
  Administered 2017-09-11: 75 mL via INTRAVENOUS

## 2017-09-11 MED ORDER — VANCOMYCIN HCL 10 G IV SOLR
1250.0000 mg | Freq: Two times a day (BID) | INTRAVENOUS | Status: DC
  Filled 2017-09-11: qty 1250

## 2017-09-11 MED ORDER — MAGNESIUM SULFATE 2 GM/50ML IV SOLN
2.0000 g | Freq: Once | INTRAVENOUS | Status: AC
  Administered 2017-09-11: 2 g via INTRAVENOUS
  Filled 2017-09-11: qty 50

## 2017-09-11 MED ORDER — POTASSIUM CHLORIDE 10 MEQ/100ML IV SOLN
10.0000 meq | INTRAVENOUS | Status: AC
  Administered 2017-09-11 (×2): 10 meq via INTRAVENOUS
  Filled 2017-09-11 (×3): qty 100

## 2017-09-11 MED ORDER — DEXTROSE 5 % IV SOLN
1.0000 g | Freq: Three times a day (TID) | INTRAVENOUS | Status: DC
  Administered 2017-09-12: 1 g via INTRAVENOUS
  Filled 2017-09-11 (×2): qty 1

## 2017-09-11 MED ORDER — DOCUSATE SODIUM 100 MG PO CAPS
100.0000 mg | ORAL_CAPSULE | Freq: Two times a day (BID) | ORAL | Status: DC
  Administered 2017-09-11 – 2017-09-23 (×22): 100 mg via ORAL
  Filled 2017-09-11 (×23): qty 1

## 2017-09-11 MED ORDER — ONDANSETRON HCL 4 MG/2ML IJ SOLN: 4.0000 mg | Freq: Four times a day (QID) | INTRAMUSCULAR | Status: DC | PRN

## 2017-09-11 MED ORDER — KETOROLAC TROMETHAMINE 15 MG/ML IJ SOLN
15.0000 mg | Freq: Once | INTRAMUSCULAR | Status: AC
  Administered 2017-09-11: 15 mg via INTRAVENOUS
  Filled 2017-09-11: qty 1

## 2017-09-11 MED ORDER — KETOROLAC TROMETHAMINE 15 MG/ML IJ SOLN
15.0000 mg | Freq: Four times a day (QID) | INTRAMUSCULAR | Status: DC | PRN
Start: 2017-09-11 — End: 2017-09-16
  Administered 2017-09-11 – 2017-09-16 (×16): 15 mg via INTRAVENOUS
  Filled 2017-09-11 (×16): qty 1

## 2017-09-11 MED ORDER — LORAZEPAM 2 MG/ML IJ SOLN
0.5000 mg | Freq: Once | INTRAMUSCULAR | Status: AC | PRN
  Administered 2017-09-14: 0.5 mg via INTRAVENOUS
  Filled 2017-09-11: qty 1

## 2017-09-11 NOTE — Progress Notes (Signed)
   09/11/17 2303  Vitals  BP (!) 81/45  MAP (mmHg) (!) 57  Pulse Rate 73  ECG Heart Rate 73   NP on call notified of blood pressure. Patient asleep but easily aroused & follows commands. Order written for 573mL NS bolus. Will continue to monitor.

## 2017-09-11 NOTE — Consult Note (Signed)
[facility]  [phone] [phone]     ID: [name] DOB: [birth year]  MR#: [id number]  JYN#:[id number]  Patient Care Team: Marliss Coots, NP as PCP - General Chauncey Cruel, MD OTHER MD:  CHIEF COMPLAINT: uncontrolled pain; paravertebral mass  CURRENT TREATMENT: workup in progress   HISTORY OF CURRENT ILLNESS: The patient is partly sedated and falls asleep when trying to answer questions. She tells me she has severe cramping pain in the neck and upper back, which goes away when she is lying down but returns when she tries to sit up. She was seen in the ED yesterday with a similar problem but now it's worse. She is unable to give me a reliable history at this point   INTERVAL HISTORY: I evaluated the patient in the ED 09/11/2017; no family in room  REVIEW OF SYSTEMS: Denies fever, bleeding or unexplained faitgue or weight loss. However ROS not reliable (told me she never had surgery in past and that she had a period 2 days ago).  PAST MEDICAL HISTORY: Past Medical History:  Diagnosis Date  . ADD (attention deficit disorder with hyperactivity)   . Bilateral ovarian cysts   . Chlamydia   . Depression   . PID (acute pelvic inflammatory disease)   . PTSD (post-traumatic stress disorder)   . Sciatica     PAST SURGICAL HISTORY: Past Surgical History:  Procedure Laterality Date  . ABDOMINAL HYSTERECTOMY    . BACK SURGERY     dbl lumbar fusion L5-S1  . BLADDER SURGERY    . LAPAROSCOPY    . mesh removel    . ROTATOR CUFF REPAIR Right 02/15/2013    FAMILY HISTORY Family History  Problem Relation Age of Onset  . Hypertension Mother   . Heart failure Mother   . Hypertension Father   . Heart failure Father   . Hypertension Sister   . Cancer Other   States her father and mother are living, in their mid 53's, and that she has no siblings  GYNECOLOGIC HISTORY:  No LMP recorded. Patient has had a hysterectomy. States  GXP0  SOCIAL HISTORY:  Tells me she lives by herself, no pets     ADVANCED DIRECTIVES:    HEALTH MAINTENANCE: Social History   Tobacco Use  . Smoking status: Current Every Day Smoker    Packs/day: 0.25    Years: 14.00    Pack years: 3.50    Types: Cigarettes  . Smokeless tobacco: Current User  Substance Use Topics  . Alcohol use: No  . Drug use: No    Comment: former     Colonoscopy:  PAP:  Bone density:   Allergies  Allergen Reactions  . Imitrex [Sumatriptan Base] Shortness Of Breath  . Clarithromycin Nausea And Vomiting  . Clindamycin/Lincomycin Diarrhea    Violently sick-projectile vomiting  . Morphine And Related Nausea And Vomiting  . Vicodin [Hydrocodone-Acetaminophen] Nausea And Vomiting    Current Facility-Administered Medications  Medication Dose Route Frequency Provider Last Rate Last Dose  . iopamidol (ISOVUE-300) 61 % injection           . potassium chloride 10 mEq in 100 mL IVPB  10 mEq Intravenous Q1 Hr x 3 Duffy Bruce, MD 100 mL/hr at 09/11/17 0940 10 mEq at 09/11/17 0940  . potassium chloride SA (K-DUR,KLOR-CON) CR tablet 40 mEq  40 mEq Oral Once Duffy Bruce, MD       Current Outpatient Medications  Medication Sig Dispense Refill  .  acetaminophen (TYLENOL) 500 MG tablet Take 500 mg by mouth every 6 (six) hours as needed for moderate pain.    Marland Kitchen ibuprofen (ADVIL,MOTRIN) 200 MG tablet Take 200 mg by mouth every 6 (six) hours as needed for moderate pain.    . cephALEXin (KEFLEX) 500 MG capsule Take 1 capsule (500 mg total) 2 (two) times daily by mouth. (Patient not taking: Reported on 09/11/2017) 14 capsule 0  . doxycycline (VIBRAMYCIN) 100 MG capsule Take 1 capsule (100 mg total) 2 (two) times daily by mouth. (Patient not taking: Reported on 09/11/2017) 14 capsule 0  . methocarbamol (ROBAXIN) 500 MG tablet Take 1 tablet (500 mg total) every 8 (eight) hours as needed by mouth for muscle spasms. (Patient not taking: Reported on 09/11/2017) 20  tablet 0  . potassium chloride (K-DUR) 10 MEQ tablet Take 2 tablets (20 mEq total) 2 (two) times daily by mouth. (Patient not taking: Reported on 09/11/2017) 20 tablet 0    OBJECTIVE: middle aged White woman examined in bed  Vitals:   09/11/17 0559  BP: (!) 152/80  Pulse: 67  Resp: 15  Temp: 99 F (37.2 C)  SpO2: 93%     There is no height or weight on file to calculate BMI.   Wt Readings from Last 3 Encounters:  09/09/17 250 lb (113.4 kg)  06/06/17 236 lb (107 kg)  05/29/17 232 lb (105.2 kg)       Ocular: Sclerae unicteric, pupils round and equal Ear-nose-throat: Oropharynx clear, slightly dry Lymphatic: No cervical or supraclavicular adenopathy, no axillary adenopathy Lungs no rales or rhonchi, auscultated anterolaterally Heart regular rate and rhythm Abd soft, nontender Neuro: non-focal, well-oriented, appropriate affect Breasts: deferred   LAB RESULTS:  CMP     Component Value Date/Time   NA 131 (L) 09/11/2017 0732   K 2.9 (L) 09/11/2017 0732   CL 94 (L) 09/11/2017 0732   CO2 30 09/11/2017 0732   GLUCOSE 109 (H) 09/11/2017 0732   BUN 9 09/11/2017 0732   CREATININE 0.80 09/11/2017 0732   CALCIUM 8.3 (L) 09/11/2017 0732   PROT 6.6 09/10/2017 0245   ALBUMIN 3.0 (L) 09/10/2017 0245   AST 32 09/10/2017 0245   ALT 28 09/10/2017 0245   ALKPHOS 84 09/10/2017 0245   BILITOT 1.1 09/10/2017 0245   GFRNONAA >60 09/11/2017 0732   GFRAA >60 09/11/2017 0732    No results found for: TOTALPROTELP, ALBUMINELP, A1GS, A2GS, BETS, BETA2SER, GAMS, MSPIKE, SPEI  No results found for: KPAFRELGTCHN, LAMBDASER, KAPLAMBRATIO  Lab Results  Component Value Date   WBC 17.1 (H) 09/11/2017   NEUTROABS 14.4 (H) 09/11/2017   HGB 12.1 09/11/2017   HCT 34.2 (L) 09/11/2017   MCV 89.3 09/11/2017   PLT 383 09/11/2017    _0 @  No results found for: LABCA2  No components found for: ELFYBO175  No results for input(s): INR in the last 168 hours.  No results found for:  LABCA2  No results found for: ZWC585  No results found for: IDP824  No results found for: MPN361  No results found for: CA2729  No components found for: HGQUANT  No results found for: CEA1 / No results found for: CEA1   No results found for: AFPTUMOR  No results found for: CHROMOGRNA  No results found for: PSA1  Admission on 09/11/2017  Component Date Value Ref Range Status  . Sodium 09/11/2017 131* 135 - 145 mmol/L Final  . Potassium 09/11/2017 2.9* 3.5 - 5.1 mmol/L Final  . Chloride 09/11/2017 94* 101 -  111 mmol/L Final  . CO2 09/11/2017 30  22 - 32 mmol/L Final  . Glucose, Bld 09/11/2017 109* 65 - 99 mg/dL Final  . BUN 09/11/2017 9  6 - 20 mg/dL Final  . Creatinine, Ser 09/11/2017 0.80  0.44 - 1.00 mg/dL Final  . Calcium 09/11/2017 8.3* 8.9 - 10.3 mg/dL Final  . GFR calc non Af Amer 09/11/2017 >60  >60 mL/min Final  . GFR calc Af Amer 09/11/2017 >60  >60 mL/min Final   Comment: (NOTE) The eGFR has been calculated using the CKD EPI equation. This calculation has not been validated in all clinical situations. eGFR's persistently <60 mL/min signify possible Chronic Kidney Disease.   . Anion gap 09/11/2017 7  5 - 15 Final  . Magnesium 09/11/2017 1.8  1.7 - 2.4 mg/dL Final  . WBC 09/11/2017 17.1* 4.0 - 10.5 K/uL Final  . RBC 09/11/2017 3.83* 3.87 - 5.11 MIL/uL Final  . Hemoglobin 09/11/2017 12.1  12.0 - 15.0 g/dL Final  . HCT 09/11/2017 34.2* 36.0 - 46.0 % Final  . MCV 09/11/2017 89.3  78.0 - 100.0 fL Final  . MCH 09/11/2017 31.6  26.0 - 34.0 pg Final  . MCHC 09/11/2017 35.4  30.0 - 36.0 g/dL Final  . RDW 09/11/2017 13.4  11.5 - 15.5 % Final  . Platelets 09/11/2017 383  150 - 400 K/uL Final  . Neutrophils Relative % 09/11/2017 84  % Final  . Neutro Abs 09/11/2017 14.4* 1.7 - 7.7 K/uL Final  . Lymphocytes Relative 09/11/2017 6  % Final  . Lymphs Abs 09/11/2017 1.1  0.7 - 4.0 K/uL Final  . Monocytes Relative 09/11/2017 9  % Final  . Monocytes Absolute 09/11/2017 1.6*  0.1 - 1.0 K/uL Final  . Eosinophils Relative 09/11/2017 1  % Final  . Eosinophils Absolute 09/11/2017 0.1  0.0 - 0.7 K/uL Final  . Basophils Relative 09/11/2017 0  % Final  . Basophils Absolute 09/11/2017 0.0  0.0 - 0.1 K/uL Final  . Total CK 09/11/2017 198  38 - 234 U/L Final  Admission on 09/10/2017, Discharged on 09/10/2017  Component Date Value Ref Range Status  . Color, Urine 09/10/2017 AMBER* YELLOW Final   BIOCHEMICALS MAY BE AFFECTED BY COLOR  . APPearance 09/10/2017 HAZY* CLEAR Final  . Specific Gravity, Urine 09/10/2017 1.017  1.005 - 1.030 Final  . pH 09/10/2017 5.0  5.0 - 8.0 Final  . Glucose, UA 09/10/2017 NEGATIVE  NEGATIVE mg/dL Final  . Hgb urine dipstick 09/10/2017 MODERATE* NEGATIVE Final  . Bilirubin Urine 09/10/2017 NEGATIVE  NEGATIVE Final  . Ketones, ur 09/10/2017 NEGATIVE  NEGATIVE mg/dL Final  . Protein, ur 09/10/2017 30* NEGATIVE mg/dL Final  . Nitrite 09/10/2017 NEGATIVE  NEGATIVE Final  . Leukocytes, UA 09/10/2017 LARGE* NEGATIVE Final  . RBC / HPF 09/10/2017 6-30  0 - 5 RBC/hpf Final  . WBC, UA 09/10/2017 TOO NUMEROUS TO COUNT  0 - 5 WBC/hpf Final  . Bacteria, UA 09/10/2017 RARE* NONE SEEN Final  . Squamous Epithelial / LPF 09/10/2017 6-30* NONE SEEN Final  . Mucus 09/10/2017 PRESENT   Final  . Hyaline Casts, UA 09/10/2017 PRESENT   Final  . WBC 09/10/2017 18.9* 4.0 - 10.5 K/uL Final  . RBC 09/10/2017 3.86* 3.87 - 5.11 MIL/uL Final  . Hemoglobin 09/10/2017 12.2  12.0 - 15.0 g/dL Final  . HCT 09/10/2017 34.3* 36.0 - 46.0 % Final  . MCV 09/10/2017 88.9  78.0 - 100.0 fL Final  . MCH 09/10/2017 31.6  26.0 -  34.0 pg Final  . MCHC 09/10/2017 35.6  30.0 - 36.0 g/dL Final  . RDW 09/10/2017 13.5  11.5 - 15.5 % Final  . Platelets 09/10/2017 334  150 - 400 K/uL Final  . Neutrophils Relative % 09/10/2017 84  % Final  . Neutro Abs 09/10/2017 16.0* 1.7 - 7.7 K/uL Final  . Lymphocytes Relative 09/10/2017 8  % Final  . Lymphs Abs 09/10/2017 1.5  0.7 - 4.0 K/uL Final   . Monocytes Relative 09/10/2017 7  % Final  . Monocytes Absolute 09/10/2017 1.3* 0.1 - 1.0 K/uL Final  . Eosinophils Relative 09/10/2017 1  % Final  . Eosinophils Absolute 09/10/2017 0.1  0.0 - 0.7 K/uL Final  . Basophils Relative 09/10/2017 0  % Final  . Basophils Absolute 09/10/2017 0.0  0.0 - 0.1 K/uL Final  . Sodium 09/10/2017 132* 135 - 145 mmol/L Final  . Potassium 09/10/2017 2.6* 3.5 - 5.1 mmol/L Final   Comment: REPEATED TO VERIFY CRITICAL RESULT CALLED TO, READ BACK BY AND VERIFIED WITH: COHEN,L RN 11.16.18 _0  ZANDO,C   . Chloride 09/10/2017 98* 101 - 111 mmol/L Final  . CO2 09/10/2017 24  22 - 32 mmol/L Final  . Glucose, Bld 09/10/2017 111* 65 - 99 mg/dL Final  . BUN 09/10/2017 9  6 - 20 mg/dL Final  . Creatinine, Ser 09/10/2017 0.88  0.44 - 1.00 mg/dL Final  . Calcium 09/10/2017 8.0* 8.9 - 10.3 mg/dL Final  . Total Protein 09/10/2017 6.6  6.5 - 8.1 g/dL Final  . Albumin 09/10/2017 3.0* 3.5 - 5.0 g/dL Final  . AST 09/10/2017 32  15 - 41 U/L Final  . ALT 09/10/2017 28  14 - 54 U/L Final  . Alkaline Phosphatase 09/10/2017 84  38 - 126 U/L Final  . Total Bilirubin 09/10/2017 1.1  0.3 - 1.2 mg/dL Final  . GFR calc non Af Amer 09/10/2017 >60  >60 mL/min Final  . GFR calc Af Amer 09/10/2017 >60  >60 mL/min Final   Comment: (NOTE) The eGFR has been calculated using the CKD EPI equation. This calculation has not been validated in all clinical situations. eGFR's persistently <60 mL/min signify possible Chronic Kidney Disease.   . Anion gap 09/10/2017 10  5 - 15 Final  . Yeast Wet Prep HPF POC 09/10/2017 NONE SEEN  NONE SEEN Final  . Trich, Wet Prep 09/10/2017 NONE SEEN  NONE SEEN Final  . Clue Cells Wet Prep HPF POC 09/10/2017 NONE SEEN  NONE SEEN Final  . WBC, Wet Prep HPF POC 09/10/2017 FEW* NONE SEEN Final  . Sperm 09/10/2017 NONE SEEN   Final  . Chlamydia 09/10/2017 Negative   Final   Normal Reference Range - Negative  . Neisseria gonorrhea 09/10/2017 Negative    Final   Normal Reference Range - Negative  . Magnesium 09/10/2017 1.9  1.7 - 2.4 mg/dL Final  . Specimen Description 09/10/2017 URINE, CLEAN CATCH   Final  . Special Requests 09/10/2017 NONE   Final  . Culture 09/10/2017 MULTIPLE SPECIES PRESENT, SUGGEST RECOLLECTION*  Final  . Report Status 09/10/2017 09/11/2017 FINAL   Final    (this displays the last labs from the last 3 days)  No results found for: TOTALPROTELP, ALBUMINELP, A1GS, A2GS, BETS, BETA2SER, GAMS, MSPIKE, SPEI (this displays SPEP labs)  No results found for: KPAFRELGTCHN, LAMBDASER, KAPLAMBRATIO (kappa/lambda light chains)  No results found for: HGBA, HGBA2QUANT, HGBFQUANT, HGBSQUAN (Hemoglobinopathy evaluation)   No results found for: LDH  No results found for: IRON, TIBC, IRONPCTSAT (  Iron and TIBC)  No results found for: FERRITIN  Urinalysis    Component Value Date/Time   COLORURINE AMBER (A) 09/10/2017 0057   APPEARANCEUR HAZY (A) 09/10/2017 0057   LABSPEC 1.017 09/10/2017 0057   PHURINE 5.0 09/10/2017 0057   GLUCOSEU NEGATIVE 09/10/2017 0057   HGBUR MODERATE (A) 09/10/2017 0057   BILIRUBINUR NEGATIVE 09/10/2017 0057   KETONESUR NEGATIVE 09/10/2017 0057   PROTEINUR 30 (A) 09/10/2017 0057   UROBILINOGEN 0.2 06/27/2015 0225   NITRITE NEGATIVE 09/10/2017 0057   LEUKOCYTESUR LARGE (A) 09/10/2017 0057     STUDIES: Ct Chest W Contrast  Result Date: 09/11/2017 CLINICAL DATA:  Back pain for 6 weeks. Abnormal paravertebral/posterior mediastinal soft tissue on earlier abdominal CT. EXAM: CT CHEST WITH CONTRAST TECHNIQUE: Multidetector CT imaging of the chest was performed during intravenous contrast administration. CONTRAST:  38m ISOVUE-300 IOPAMIDOL (ISOVUE-300) INJECTION 61% COMPARISON:  Abdominal CT earlier today. CT abdomen pelvis 06/25/2016. FINDINGS: Cardiovascular: Heart is borderline in size. Aorta is normal caliber. Mediastinum/Nodes: Abnormal soft tissue throughout the posterior mediastinum in the mid  and lower chest extending into the paravertebral regions as seen on abdominal CT. There are borderline sized and mildly prominent scattered mediastinal lymph nodes. Right paratracheal lymph node has a short axis diameter of 9 mm. Left prevascular lymph node between the left common carotid and left subclavian artery has a short axis diameter of 10 mm. Other similarly sized and smaller prevascular, AP window and subcarinal lymph nodes. No axillary or hilar adenopathy. Lungs/Pleura: Trace left and small right pleural effusions. Right basilar airspace opacities likely reflect atelectasis. Lingular scarring is stable since prior study. Upper Abdomen: Mildly prominent upper abdominal retroperitoneal lymph nodes in the celiac axis and gastrohepatic ligament as seen on prior abdominal CT. Musculoskeletal: Chest wall soft tissues are unremarkable. No acute bony abnormality. No radiographic changes of osteomyelitis or discitis to explain the abnormal posterior mediastinal/ perivertebral soft tissue. IMPRESSION: Abnormal soft tissue in the posterior mediastinum and paravertebral regions in the mid and lower chest. Borderline mediastinal lymph nodes as described above. Findings concerning for possible lymphoma. Consider consultation with cardiothoracic surgery or Oncology. Small right pleural effusion and trace left pleural effusion. Borderline enlarged upper abdominal lymph nodes in the gastrohepatic ligament and celiac axis region. Borderline heart size. Right basilar airspace opacities, favor atelectasis. Electronically Signed   By: KRolm BaptiseM.D.   On: 09/11/2017 09:27   Ct Renal Stone Study  Result Date: 09/11/2017 CLINICAL DATA:  Flank pain, back pain EXAM: CT ABDOMEN AND PELVIS WITHOUT CONTRAST TECHNIQUE: Multidetector CT imaging of the abdomen and pelvis was performed following the standard protocol without IV contrast. COMPARISON:  06/25/2016 FINDINGS: Lower chest: Small right pleural effusion. Scarring in the  lingula. Right basilar O atelectasis. Heart is normal size. Abnormal soft tissue is noted in the posterior mediastinum and paravertebral regions in the visualized lower chest, images 1 to 14. All all low all all Hepatobiliary: No focal hepatic abnormality. Gallbladder unremarkable. Pancreas: No focal abnormality or ductal dilatation. Spleen: No focal abnormality.  Normal size. Adrenals/Urinary Tract: No adrenal abnormality. No focal renal abnormality. No stones or hydronephrosis. Urinary bladder is unremarkable. Stomach/Bowel: Stomach, large and small bowel grossly unremarkable. Normal appendix Vascular/Lymphatic: Aorta is normal caliber. There are mildly prominent upper abdominal retroperitoneal lymph nodes in the celiac axis region. Index celiac axis node has a short axis diameter of 10 mm on image 26. Reproductive: Prior hysterectomy. Cysts within the right ovary 4.1 x 3.1 cm. Left ovary unremarkable. Other: No  free fluid or free air. Musculoskeletal: Postoperative changes from posterior fusion L3-S1. IMPRESSION: Abnormal posterior mediastinal/paravertebral soft tissue in the mid to lower chest. Associated small right pleural effusion. Mildly prominent upper abdominal retroperitoneal lymph nodes. This is of unknown etiology. Lymphoma is a possibility. Osteomyelitis or discitis is possible but felt less likely given the unremarkable appearance of the bony structures. I would recommend further evaluation with chest CT with IV contrast. Thoracic spine MRI may be helpful subsequently, but I want median with chest CT. Lingular scarring.  Right base atelectasis. These results were called by telephone at the time of interpretation on 09/11/2017 at 8:27 am to Dr. Duffy Bruce , who verbally acknowledged these results. Electronically Signed   By: Rolm Baptise M.D.   On: 09/11/2017 08:28    ELIGIBLE FOR AVAILABLE RESEARCH PROTOCOL: no  ASSESSMENT: 51 y.o. [place] woman admitted with uncontrolled pain 08/11/2017,  scans showing posterior mediastinal and paravertebral soft tissue masses as well as some adenopathy, with lymphoma in the differential  PLAN: MRI of the lumbar and thoracic spin have already been ordered; patient may need to be sedated further for the test.  I will add LDH, beta-2-microglobulin and CEA to labs, but definitive diagnosis may require biopsy and this of course cannot be done until 11/19 at the earliest.  Will follow with you  Chauncey Cruel, MD   09/11/2017 10:42 AM Medical Oncology and Hematology [facility] [place], [place] [address] Tel. [phone]    Fax. [phone]

## 2017-09-11 NOTE — Progress Notes (Signed)
VASCULAR LAB PRELIMINARY  PRELIMINARY  PRELIMINARY  PRELIMINARY  Bilateral lower extremity venous duplex completed.    Preliminary report:  There is no DVT or SVT noted in the bilateral lower extremities.   Yocheved Depner, RVT 09/11/2017, 4:27 PM

## 2017-09-11 NOTE — Progress Notes (Signed)
Pt transferred to ICU/SD. Tremorous. Upon moving between beds RN attempted to remove towel from pt lap. Pt very possessive over towel. Once pt situated RN assessed contents of towel and found 1 syringe with clear yellow fluid in it and needle attached along with a tournequet. RN notified security. Security to room to assist in search of belongings. 2 more syringes found. Pt made aware of all findings and explained that we had to dispose of them. RN disposed of all with security as witness.  Will continue to monitor pt.

## 2017-09-11 NOTE — Progress Notes (Signed)
Pharmacy Antibiotic Note  AIMI ESSNER is a 51 y.o. female admitted on 09/11/2017 with fever, IVDU, back pain, fever, abnormal lymphadenopathy.  Pharmacy has been consulted for Vancomycin, Cefepime dosing.  Tm 102 WBC 17.1 SCr 0.8, CrCl > 100 ml/min  Plan: Cefepime 2g IV x1 dose then 1g IV q8h.  Vancomycin 2g IV x1 dose, then 1250 mg IV q12h.  Measure Vanc trough at steady state as needed. Follow up renal fxn, culture results, and clinical course.   Height: 5\' 6"  (167.6 cm) Weight: 246 lb 0.5 oz (111.6 kg) IBW/kg (Calculated) : 59.3  Temp (24hrs), Avg:100.1 F (37.8 C), Min:99 F (37.2 C), Max:102 F (38.9 C)  Recent Labs  Lab 09/10/17 0245 09/11/17 0732 09/11/17 1343  WBC 18.9* 17.1*  --   CREATININE 0.88 0.80  --   LATICACIDVEN  --   --  1.3    Estimated Creatinine Clearance: 108.9 mL/min (by C-G formula based on SCr of 0.8 mg/dL).    Allergies  Allergen Reactions  . Imitrex [Sumatriptan Base] Shortness Of Breath  . Clarithromycin Nausea And Vomiting  . Clindamycin/Lincomycin Diarrhea    Violently sick-projectile vomiting  . Morphine And Related Nausea And Vomiting  . Vicodin [Hydrocodone-Acetaminophen] Nausea And Vomiting    Thank you for allowing pharmacy to be a part of this patient's care.  Gretta Arab PharmD, BCPS Pager [phone] 09/11/2017 5:56 PM

## 2017-09-11 NOTE — ED Provider Notes (Signed)
[facility] DEPT Provider Note   CSN: [id number] Arrival date & time: 09/11/17  6387     History   Chief Complaint Chief Complaint  Patient presents with  . Neck Pain  . Back Pain    HPI [name] is a 51 y.o. female.  HPI   51 year old female with extensive past medical history as below including chronic substance abuse here with severe cramping and flank pain.  The patient was just seen overnight.  Provider note is not available, but per review of records, patient had severe hypokalemia and leukocytosis with positive UTI.  She was treated for possible pyelonephritis and sent home.  She states that since then, she has had recurrence of severe right neck, bilateral flank and back pain, as well as left leg pain.  She describes the pain as severe cramp-like pain.  She felt significantly improved with Toradol yesterday, but the pain has since returned and is now severe.  The pain is worse with any movement and palpation.  She has not had any fevers but has had some chills.  No nausea or vomiting.  No diarrhea.  She has a history of hypokalemia of unknown etiology.  Denies any recent trauma.  Denies any lower extremity weakness or numbness.  No loss of bowel or bladder function.  Past Medical History:  Diagnosis Date  . ADD (attention deficit disorder with hyperactivity)   . Bilateral ovarian cysts   . Chlamydia   . Depression   . PID (acute pelvic inflammatory disease)   . PTSD (post-traumatic stress disorder)   . Sciatica     Patient Active Problem List   Diagnosis Date Noted  . Heroin overdose ([place]) 05/21/2017  . Acute respiratory failure ([place]) 05/21/2017  . Hypokalemia 05/21/2017  . Elevated LFTs 05/21/2017  . MDD (major depressive disorder), recurrent episode, severe ([place]) 04/09/2017  . Polysubstance (including opioids) dependence with physiological dependence ([place]) 04/09/2017  . Major depressive disorder, recurrent severe without  psychotic features ([place]) 04/09/2017  . Chronic back pain 12/06/2013  . PTSD (post-traumatic stress disorder) 12/06/2013  . Other screening mammogram 12/06/2013  . ADD (attention deficit disorder) 12/06/2013    Past Surgical History:  Procedure Laterality Date  . ABDOMINAL HYSTERECTOMY    . BACK SURGERY     dbl lumbar fusion L5-S1  . BLADDER SURGERY    . LAPAROSCOPY    . mesh removel    . ROTATOR CUFF REPAIR Right 02/15/2013    OB History    No data available       Home Medications    Prior to Admission medications   Medication Sig Start Date End Date Taking? Authorizing Provider  acetaminophen (TYLENOL) 500 MG tablet Take 500 mg by mouth every 6 (six) hours as needed for moderate pain.    [provider]  cephALEXin (KEFLEX) 500 MG capsule Take 1 capsule (500 mg total) 2 (two) times daily by mouth. 09/10/17   Antonietta Breach, PA-C  doxycycline (VIBRAMYCIN) 100 MG capsule Take 1 capsule (100 mg total) 2 (two) times daily by mouth. 09/10/17   Antonietta Breach, PA-C  ibuprofen (ADVIL,MOTRIN) 200 MG tablet Take 200 mg by mouth every 6 (six) hours as needed for moderate pain.    [provider]  methocarbamol (ROBAXIN) 500 MG tablet Take 1 tablet (500 mg total) every 8 (eight) hours as needed by mouth for muscle spasms. 09/10/17   Antonietta Breach, PA-C  potassium chloride (K-DUR) 10 MEQ tablet Take 2 tablets (20  mEq total) 2 (two) times daily by mouth. 09/10/17   Antonietta Breach, PA-C    Family History Family History  Problem Relation Age of Onset  . Hypertension Mother   . Heart failure Mother   . Hypertension Father   . Heart failure Father   . Hypertension Sister   . Cancer Other     Social History Social History   Tobacco Use  . Smoking status: Current Every Day Smoker    Packs/day: 0.25    Years: 14.00    Pack years: 3.50    Types: Cigarettes  . Smokeless tobacco: Current User  Substance Use Topics  . Alcohol use: No  . Drug use: No    Comment: former       Allergies   Imitrex [sumatriptan base]; Clarithromycin; Clindamycin/lincomycin; Morphine and related; and Vicodin [hydrocodone-acetaminophen]   Review of Systems Review of Systems  Constitutional: Positive for fatigue. Negative for fever.  Genitourinary: Positive for dysuria and flank pain.  Musculoskeletal: Positive for back pain, neck pain and neck stiffness.  Neurological: Positive for weakness.  All other systems reviewed and are negative.    Physical Exam Updated Vital Signs BP (!) 152/80   Pulse 67   Temp 99 F (37.2 C) (Oral)   Resp 15   SpO2 93%   Physical Exam  Constitutional: She is oriented to person, place, and time. She appears well-developed and well-nourished. She appears distressed (in pain, crying).  HENT:  Head: Normocephalic and atraumatic.  Eyes: Conjunctivae are normal.  Neck: Neck supple.  Severe TTP over right paraspinal areas, mild muscular spasm noted. No midline TTP.  Cardiovascular: Normal rate, regular rhythm and normal heart sounds. Exam reveals no friction rub.  No murmur heard. Pulmonary/Chest: Effort normal and breath sounds normal. No respiratory distress. She has no wheezes. She has no rales.  Abdominal: Soft. She exhibits no distension. There is tenderness (mild, diffuse).  Musculoskeletal: She exhibits no edema.  Diffuse TTP over paraspinal muscles. No midline deformity.  Neurological: She is alert and oriented to person, place, and time. She exhibits normal muscle tone.  Strength 5/5 b/l LE. Normal sensation to light touch.  Skin: Skin is warm. Capillary refill takes less than 2 seconds. No rash noted.  Psychiatric: She has a normal mood and affect.  Nursing note and vitals reviewed.    ED Treatments / Results  Labs (all labs ordered are listed, but only abnormal results are displayed) Labs Reviewed  BASIC METABOLIC PANEL - Abnormal; Notable for the following components:      Result Value   Sodium 131 (*)    Potassium 2.9  (*)    Chloride 94 (*)    Glucose, Bld 109 (*)    Calcium 8.3 (*)    All other components within normal limits  CBC WITH DIFFERENTIAL/PLATELET - Abnormal; Notable for the following components:   WBC 17.1 (*)    RBC 3.83 (*)    HCT 34.2 (*)    Neutro Abs 14.4 (*)    Monocytes Absolute 1.6 (*)    All other components within normal limits  CULTURE, BLOOD (ROUTINE X 2)  CULTURE, BLOOD (ROUTINE X 2)  MAGNESIUM  CK  URINALYSIS, ROUTINE W REFLEX MICROSCOPIC  SEDIMENTATION RATE  C-REACTIVE PROTEIN  SAVE SMEAR    EKG  EKG Interpretation  Date/Time:  Saturday September 11 2017 07:31:01 EST Ventricular Rate:  91 PR Interval:    QRS Duration: 128 QT Interval:  404 QTC Calculation: 498 R Axis:  45 Text Interpretation:  Sinus rhythm IVCD, consider atypical RBBB Baseline wander in lead(s) I II aVR Baseline wander No significant change since last tracing Confirmed by Duffy Bruce [id number]) on 09/11/2017 7:51:40 AM Also confirmed by Duffy Bruce [phone]), editor Philomena Doheny [phone])  on 09/11/2017 9:43:53 AM       Radiology Ct Chest W Contrast  Result Date: 09/11/2017 CLINICAL DATA:  Back pain for 6 weeks. Abnormal paravertebral/posterior mediastinal soft tissue on earlier abdominal CT. EXAM: CT CHEST WITH CONTRAST TECHNIQUE: Multidetector CT imaging of the chest was performed during intravenous contrast administration. CONTRAST:  42m ISOVUE-300 IOPAMIDOL (ISOVUE-300) INJECTION 61% COMPARISON:  Abdominal CT earlier today. CT abdomen pelvis 06/25/2016. FINDINGS: Cardiovascular: Heart is borderline in size. Aorta is normal caliber. Mediastinum/Nodes: Abnormal soft tissue throughout the posterior mediastinum in the mid and lower chest extending into the paravertebral regions as seen on abdominal CT. There are borderline sized and mildly prominent scattered mediastinal lymph nodes. Right paratracheal lymph node has a short axis diameter of 9 mm. Left prevascular lymph node between the left  common carotid and left subclavian artery has a short axis diameter of 10 mm. Other similarly sized and smaller prevascular, AP window and subcarinal lymph nodes. No axillary or hilar adenopathy. Lungs/Pleura: Trace left and small right pleural effusions. Right basilar airspace opacities likely reflect atelectasis. Lingular scarring is stable since prior study. Upper Abdomen: Mildly prominent upper abdominal retroperitoneal lymph nodes in the celiac axis and gastrohepatic ligament as seen on prior abdominal CT. Musculoskeletal: Chest wall soft tissues are unremarkable. No acute bony abnormality. No radiographic changes of osteomyelitis or discitis to explain the abnormal posterior mediastinal/ perivertebral soft tissue. IMPRESSION: Abnormal soft tissue in the posterior mediastinum and paravertebral regions in the mid and lower chest. Borderline mediastinal lymph nodes as described above. Findings concerning for possible lymphoma. Consider consultation with cardiothoracic surgery or Oncology. Small right pleural effusion and trace left pleural effusion. Borderline enlarged upper abdominal lymph nodes in the gastrohepatic ligament and celiac axis region. Borderline heart size. Right basilar airspace opacities, favor atelectasis. Electronically Signed   By: KRolm BaptiseM.D.   On: 09/11/2017 09:27   Ct Renal Stone Study  Result Date: 09/11/2017 CLINICAL DATA:  Flank pain, back pain EXAM: CT ABDOMEN AND PELVIS WITHOUT CONTRAST TECHNIQUE: Multidetector CT imaging of the abdomen and pelvis was performed following the standard protocol without IV contrast. COMPARISON:  06/25/2016 FINDINGS: Lower chest: Small right pleural effusion. Scarring in the lingula. Right basilar O atelectasis. Heart is normal size. Abnormal soft tissue is noted in the posterior mediastinum and paravertebral regions in the visualized lower chest, images 1 to 14. All all low all all Hepatobiliary: No focal hepatic abnormality. Gallbladder  unremarkable. Pancreas: No focal abnormality or ductal dilatation. Spleen: No focal abnormality.  Normal size. Adrenals/Urinary Tract: No adrenal abnormality. No focal renal abnormality. No stones or hydronephrosis. Urinary bladder is unremarkable. Stomach/Bowel: Stomach, large and small bowel grossly unremarkable. Normal appendix Vascular/Lymphatic: Aorta is normal caliber. There are mildly prominent upper abdominal retroperitoneal lymph nodes in the celiac axis region. Index celiac axis node has a short axis diameter of 10 mm on image 26. Reproductive: Prior hysterectomy. Cysts within the right ovary 4.1 x 3.1 cm. Left ovary unremarkable. Other: No free fluid or free air. Musculoskeletal: Postoperative changes from posterior fusion L3-S1. IMPRESSION: Abnormal posterior mediastinal/paravertebral soft tissue in the mid to lower chest. Associated small right pleural effusion. Mildly prominent upper abdominal retroperitoneal lymph nodes. This is of unknown etiology. Lymphoma is  a possibility. Osteomyelitis or discitis is possible but felt less likely given the unremarkable appearance of the bony structures. I would recommend further evaluation with chest CT with IV contrast. Thoracic spine MRI may be helpful subsequently, but I want median with chest CT. Lingular scarring.  Right base atelectasis. These results were called by telephone at the time of interpretation on 09/11/2017 at 8:27 am to Dr. Duffy Bruce , who verbally acknowledged these results. Electronically Signed   By: Rolm Baptise M.D.   On: 09/11/2017 08:28    Procedures Procedures (including critical care time)  Medications Ordered in ED Medications  potassium chloride SA (K-DUR,KLOR-CON) CR tablet 40 mEq (not administered)  potassium chloride 10 mEq in 100 mL IVPB (10 mEq Intravenous New Bag/Given 09/11/17 0940)  iopamidol (ISOVUE-300) 61 % injection (not administered)  LORazepam (ATIVAN) injection 1 mg (1 mg Intravenous Given 09/11/17 0735)   ketorolac (TORADOL) 15 MG/ML injection 15 mg (15 mg Intravenous Given 09/11/17 0735)  lactated ringers bolus 1,000 mL (0 mLs Intravenous Stopped 09/11/17 0847)  methocarbamol (ROBAXIN) 500 mg in dextrose 5 % 50 mL IVPB (0 mg Intravenous Stopped 09/11/17 0848)  magnesium sulfate IVPB 2 g 50 mL (0 g Intravenous Stopped 09/11/17 0946)  iopamidol (ISOVUE-300) 61 % injection 75 mL (75 mLs Intravenous Contrast Given 09/11/17 0855)     Initial Impression / Assessment and Plan / ED Course  I have reviewed the triage vital signs and the nursing notes.  Pertinent labs & imaging results that were available during my care of the patient were reviewed by me and considered in my medical decision making (see chart for details).     51 year old female here with diffuse neck and back pain.  Patient was just seen yesterday for similar symptoms.  She was noted to have significant leukocytosis as well as possible UTI and was given antibiotics and sent home.  She states her symptoms have since worsened.  On my exam, she is in significant distress due to pain with diffuse paraspinal as well as some midline back tenderness.  Initial differential is broad.  Her symptoms may be secondary to persistent hypokalemia causing muscular spasm, but in the setting of her IV heroin use, leukocytosis, and nondescript back pain with subacute onset, must always consider possible vertebral abnormality, osteomyelitis, or infectious process.  Will start with CT scan given her flank and lower back pain with evaluation for possible stone given her UTI, with treatment for muscular spasm and pain.  Notified by CT that patient has significant lymphadenopathy and bulky soft tissue in the retro-peritoneal and mediastinal space.  Concern for possible osteomyelitis versus cancer.  Radiology recommends CT chest which has been ordered.  Lab work otherwise shows persistent leukocytosis, hypokalemia.  Will replace.  ESR, CRP, and blood culture  sent.  Discussed with Dr. Jana Hakim. He will see pt in consultation. Agrees with plan for admission, pain control, further work-up. May benefit from MRI T spine per Radiology, but will need ankle bracelet removed prior to this. Pt drowsy after ativan but in agreement with plan. Differential discussed.  Final Clinical Impressions(s) / ED Diagnoses   Final diagnoses:  Other elevated white blood cell (WBC) count  Hypokalemia  Mediastinal mass    ED Discharge Orders    None       Duffy Bruce, MD 09/11/17 [phone]

## 2017-09-11 NOTE — ED Notes (Signed)
Please contact EMU at [phone] Need time for MRI.

## 2017-09-11 NOTE — H&P (Addendum)
[facility] History and Physical  [name] CZY:[id number] DOB: 07/15/1966 DOA: 09/11/2017   PCP: Marliss Coots, NP  Specialists: Unknown  Chief Complaint: Back pain  HPI: [name] is a 51 y.o. female with a past medical history of substance abuse, chronic back pain who presented to the emergency department on 11/15 with complains of back spasms and pain.  She was evaluated.  She was found to have an abnormal UA but it is unclear if she really had any urinary symptoms or not.  It appears that she was discharged on Robaxin, potassium, Keflex and doxycycline.  Unclear if she has filled these medications.  Presented back today with worsening symptoms.  Currently very somnolent due to the Ativan that she was given a little while ago.  Most of the history obtained from the ED physician notes.  Patient denied any recent trauma.  Denied any lower extremity weakness or numbness.  No bowel or bladder dysfunction.  In the emergency department she initially underwent a CT scan renal study which raised concern for abnormal lymphadenopathy in the mediastinal area.  She subsequently underwent CT scan of the chest which showed significant mediastinal lymphadenopathy along with a soft tissue density in that area.  This raised concern for lymphoma.  Concern was also raised for discitis.  Patient will need hospitalization for further management.  Home Medications: Prior to Admission medications   Medication Sig Start Date End Date Taking? Authorizing Provider  acetaminophen (TYLENOL) 500 MG tablet Take 500 mg by mouth every 6 (six) hours as needed for moderate pain.   Yes [provider]  ibuprofen (ADVIL,MOTRIN) 200 MG tablet Take 200 mg by mouth every 6 (six) hours as needed for moderate pain.   Yes [provider]  cephALEXin (KEFLEX) 500 MG capsule Take 1 capsule (500 mg total) 2 (two) times daily by mouth. Patient not taking: Reported on 09/11/2017 09/10/17    Antonietta Breach, PA-C  doxycycline (VIBRAMYCIN) 100 MG capsule Take 1 capsule (100 mg total) 2 (two) times daily by mouth. Patient not taking: Reported on 09/11/2017 09/10/17   Antonietta Breach, PA-C  methocarbamol (ROBAXIN) 500 MG tablet Take 1 tablet (500 mg total) every 8 (eight) hours as needed by mouth for muscle spasms. Patient not taking: Reported on 09/11/2017 09/10/17   Antonietta Breach, PA-C  potassium chloride (K-DUR) 10 MEQ tablet Take 2 tablets (20 mEq total) 2 (two) times daily by mouth. Patient not taking: Reported on 09/11/2017 09/10/17   Antonietta Breach, PA-C    Allergies:  Allergies  Allergen Reactions  . Imitrex [Sumatriptan Base] Shortness Of Breath  . Clarithromycin Nausea And Vomiting  . Clindamycin/Lincomycin Diarrhea    Violently sick-projectile vomiting  . Morphine And Related Nausea And Vomiting  . Vicodin [Hydrocodone-Acetaminophen] Nausea And Vomiting    Past Medical History: Past Medical History:  Diagnosis Date  . ADD (attention deficit disorder with hyperactivity)   . Bilateral ovarian cysts   . Chlamydia   . Depression   . PID (acute pelvic inflammatory disease)   . PTSD (post-traumatic stress disorder)   . Sciatica     Past Surgical History:  Procedure Laterality Date  . ABDOMINAL HYSTERECTOMY    . BACK SURGERY     dbl lumbar fusion L5-S1  . BLADDER SURGERY    . LAPAROSCOPY    . mesh removel    . ROTATOR CUFF REPAIR Right 02/15/2013    Social History:   Social History   Socioeconomic History  . Marital  status: Divorced    Spouse name: Not on file  . Number of children: Not on file  . Years of education: Not on file  . Highest education level: Not on file  Social Needs  . Financial resource strain: Not on file  . Food insecurity - worry: Not on file  . Food insecurity - inability: Not on file  . Transportation needs - medical: Not on file  . Transportation needs - non-medical: Not on file  Occupational History  . Not on file  Tobacco Use    . Smoking status: Current Every Day Smoker    Packs/day: 0.25    Years: 14.00    Pack years: 3.50    Types: Cigarettes  . Smokeless tobacco: Current User  Substance and Sexual Activity  . Alcohol use: No  . Drug use: No    Comment: former  . Sexual activity: Yes    Birth control/protection: Other-see comments    Comment: hysterectomy  Other Topics Concern  . Not on file  Social History Narrative  . Not on file    Family History:  Family History  Problem Relation Age of Onset  . Hypertension Mother   . Heart failure Mother   . Hypertension Father   . Heart failure Father   . Hypertension Sister   . Cancer Other      Review of Systems -unable to do currently due to somnolence  Physical Examination  Vitals:   09/11/17 0559  BP: (!) 152/80  Pulse: 67  Resp: 15  Temp: 99 F (37.2 C)  TempSrc: Oral  SpO2: 93%    BP (!) 152/80   Pulse 67   Temp 99 F (37.2 C) (Oral)   Resp 15   SpO2 93%   General appearance: Somnolent but arousable. Head: Normocephalic, without obvious abnormality, atraumatic Eyes: conjunctivae/corneas clear. PERRL, EOM's intact.  Throat: lips, mucosa, and tongue normal; teeth and gums normal Neck: no adenopathy, no carotid bruit, no JVD, supple, symmetrical, trachea midline and thyroid not enlarged, symmetric, no tenderness/mass/nodules Resp: clear to auscultation bilaterally Cardio: regular rate and rhythm, S1, S2 normal, no murmur, click, rub or gallop GI: soft, non-tender; bowel sounds normal; no masses,  no organomegaly Extremities: Nonpitting edema noted bilateral lower extremity Pulses: 2+ and symmetric Skin: Skin color, texture, turgor normal. No rashes or lesions Lymph nodes: Unable to appreciate any obvious lymphadenopathy Neurologic: She is somnolent but arousable.  Moving all her extremities.  Some limitation noted with the left leg however no abnormal reflexes.  She was able to push down on my hand with her foot.   Labs on  Admission: I have personally reviewed following labs and imaging studies  CBC: Recent Labs  Lab 09/10/17 0245 09/11/17 0732  WBC 18.9* 17.1*  NEUTROABS 16.0* 14.4*  HGB 12.2 12.1  HCT 34.3* 34.2*  MCV 88.9 89.3  PLT 334 627   Basic Metabolic Panel: Recent Labs  Lab 09/10/17 0245 09/10/17 0404 09/11/17 0732  NA 132*  --  131*  K 2.6*  --  2.9*  CL 98*  --  94*  CO2 24  --  30  GLUCOSE 111*  --  109*  BUN 9  --  9  CREATININE 0.88  --  0.80  CALCIUM 8.0*  --  8.3*  MG  --  1.9 1.8   GFR: Estimated Creatinine Clearance: 109.8 mL/min (by C-G formula based on SCr of 0.8 mg/dL). Liver Function Tests: Recent Labs  Lab 09/10/17 0245  AST  32  ALT 28  ALKPHOS 84  BILITOT 1.1  PROT 6.6  ALBUMIN 3.0*   Cardiac Enzymes: Recent Labs  Lab 09/11/17 0732  CKTOTAL 198     Radiological Exams on Admission: Ct Chest W Contrast  Result Date: 09/11/2017 CLINICAL DATA:  Back pain for 6 weeks. Abnormal paravertebral/posterior mediastinal soft tissue on earlier abdominal CT. EXAM: CT CHEST WITH CONTRAST TECHNIQUE: Multidetector CT imaging of the chest was performed during intravenous contrast administration. CONTRAST:  43mL ISOVUE-300 IOPAMIDOL (ISOVUE-300) INJECTION 61% COMPARISON:  Abdominal CT earlier today. CT abdomen pelvis 06/25/2016. FINDINGS: Cardiovascular: Heart is borderline in size. Aorta is normal caliber. Mediastinum/Nodes: Abnormal soft tissue throughout the posterior mediastinum in the mid and lower chest extending into the paravertebral regions as seen on abdominal CT. There are borderline sized and mildly prominent scattered mediastinal lymph nodes. Right paratracheal lymph node has a short axis diameter of 9 mm. Left prevascular lymph node between the left common carotid and left subclavian artery has a short axis diameter of 10 mm. Other similarly sized and smaller prevascular, AP window and subcarinal lymph nodes. No axillary or hilar adenopathy. Lungs/Pleura: Trace  left and small right pleural effusions. Right basilar airspace opacities likely reflect atelectasis. Lingular scarring is stable since prior study. Upper Abdomen: Mildly prominent upper abdominal retroperitoneal lymph nodes in the celiac axis and gastrohepatic ligament as seen on prior abdominal CT. Musculoskeletal: Chest wall soft tissues are unremarkable. No acute bony abnormality. No radiographic changes of osteomyelitis or discitis to explain the abnormal posterior mediastinal/ perivertebral soft tissue. IMPRESSION: Abnormal soft tissue in the posterior mediastinum and paravertebral regions in the mid and lower chest. Borderline mediastinal lymph nodes as described above. Findings concerning for possible lymphoma. Consider consultation with cardiothoracic surgery or Oncology. Small right pleural effusion and trace left pleural effusion. Borderline enlarged upper abdominal lymph nodes in the gastrohepatic ligament and celiac axis region. Borderline heart size. Right basilar airspace opacities, favor atelectasis. Electronically Signed   By: Rolm Baptise M.D.   On: 09/11/2017 09:27   Ct Renal Stone Study  Result Date: 09/11/2017 CLINICAL DATA:  Flank pain, back pain EXAM: CT ABDOMEN AND PELVIS WITHOUT CONTRAST TECHNIQUE: Multidetector CT imaging of the abdomen and pelvis was performed following the standard protocol without IV contrast. COMPARISON:  06/25/2016 FINDINGS: Lower chest: Small right pleural effusion. Scarring in the lingula. Right basilar O atelectasis. Heart is normal size. Abnormal soft tissue is noted in the posterior mediastinum and paravertebral regions in the visualized lower chest, images 1 to 14. All all low all all Hepatobiliary: No focal hepatic abnormality. Gallbladder unremarkable. Pancreas: No focal abnormality or ductal dilatation. Spleen: No focal abnormality.  Normal size. Adrenals/Urinary Tract: No adrenal abnormality. No focal renal abnormality. No stones or hydronephrosis.  Urinary bladder is unremarkable. Stomach/Bowel: Stomach, large and small bowel grossly unremarkable. Normal appendix Vascular/Lymphatic: Aorta is normal caliber. There are mildly prominent upper abdominal retroperitoneal lymph nodes in the celiac axis region. Index celiac axis node has a short axis diameter of 10 mm on image 26. Reproductive: Prior hysterectomy. Cysts within the right ovary 4.1 x 3.1 cm. Left ovary unremarkable. Other: No free fluid or free air. Musculoskeletal: Postoperative changes from posterior fusion L3-S1. IMPRESSION: Abnormal posterior mediastinal/paravertebral soft tissue in the mid to lower chest. Associated small right pleural effusion. Mildly prominent upper abdominal retroperitoneal lymph nodes. This is of unknown etiology. Lymphoma is a possibility. Osteomyelitis or discitis is possible but felt less likely given the unremarkable appearance of the bony structures.  I would recommend further evaluation with chest CT with IV contrast. Thoracic spine MRI may be helpful subsequently, but I want median with chest CT. Lingular scarring.  Right base atelectasis. These results were called by telephone at the time of interpretation on 09/11/2017 at 8:27 am to Dr. Duffy Bruce , who verbally acknowledged these results. Electronically Signed   By: Rolm Baptise M.D.   On: 09/11/2017 08:28    My interpretation of Electrocardiogram: Sinus rhythm at 91 bpm.  Normal axis.  No concerning ST-T wave changes.  Normal intervals.   Problem List  Principal Problem:   Lymphadenopathy Active Problems:   Hypokalemia   Back pain   Hyponatremia   Right ovarian cyst   Hypotension   Assessment: This is a 51 year old Caucasian female with a past medical history significant only for substance abuse who presented with complaints of back pain.  Review of previous records suggest that she has had chronic back pain.  However evaluation has revealed abnormal soft tissue density in the mediastinal area  along with abnormal appearing lymph nodes.  Concern is for lymphoma.  Plan: #1 abnormal lymphadenopathy with soft tissue density in the mediastinal area: All of this could be accounting for her symptoms.  However she also has chronic back pain.  Oncology has been consulted and we await their input.  Patient will need to have biopsy for diagnosis.  #2  Upper back pain: She has chronic back pain.  Acute component could be due to the abnormalities noted on the CT scan. On the CT scan at least there was no's findings suggestive of discitis.  However patient may benefit from MRI to further look at that area.  We will proceed with MRI thoracic and lumbar spine.  Of note patient does have a ankle bracelet.  This is due to a court order.  This will need to be removed for the MRI to be done. Per ED nurse, MRI and nursing staff to contact EMU at [phone] for this to be removed for the MRI.  Apparently they will need to remove the bracelet just for the patient to undergo MRI and then they will have to place it back.  #3 hypokalemia and hyponatremia: This will be repleted.  Magnesium level is normal.  Repeat labs tomorrow.  Potassium to be supplemented orally and intravenously.  #4  Somnolence with hypotension: Most likely this is secondary to medication effect.  She was given Ativan and Robaxin.  She is easily arousable.  Does not have any focal neurological deficits.  She will be given a fluid bolus.  We will check lactic acid level since she does have elevated WBC.  Monitor her in stepdown at least for today.  #5  Leukocytosis with recent abnormal UA: He did have leukocytes however also noted to have squamous epithelial cells.  No mention of any urinary symptoms.  Urine culture was without any growth.  We will hold off on antibiotics for now.  If she develops fever or there is concern for sepsis then we may have to initiate antibiotics.  #6  Right ovarian cyst: Noted incidentally on CT scan.  Outpatient  monitoring.  #7  Lower extremity edema: Patient mentioned swelling of the legs to the ED providers.  We will order Doppler studies.  #8 history of substance abuse: This is in the recent past.  Apparently patient has been clean.  We will check a urine drug screen.  ADDENDUM Patient spiked fever to 102. She was found to have  syringe with yellow fluid by nursing staff. This was taken away. Concern for bacteremia. Will treat with broad spectrum antibiotics, Vanc and cefepime. Follow up on culture data.  DVT Prophylaxis: SCDs will need to be ordered once Doppler studies do not show any DVT Code Status: Full code Family Communication: No family at bedside Consults called: Oncology  Severity of Illness: The appropriate patient status for this patient is INPATIENT. Inpatient status is judged to be reasonable and necessary in order to provide the required intensity of service to ensure the patient's safety. The patient's presenting symptoms, physical exam findings, and initial radiographic and laboratory data in the context of their chronic comorbidities is felt to place them at high risk for further clinical deterioration. Furthermore, it is not anticipated that the patient will be medically stable for discharge from the hospital within 2 midnights of admission. The following factors support the patient status of inpatient.   " The patient's presenting symptoms include neck pain. " The worrisome physical exam findings include lethargy. " The initial radiographic and laboratory data are worrisome because of abnormal lymph nodes and abnormal soft tissue density in the mediastinal area. " The chronic co-morbidities include history of substance abuse.   * I certify that at the point of admission it is my clinical judgment that the patient will require inpatient hospital care spanning beyond 2 midnights from the point of admission due to high intensity of service, high risk for further deterioration and  high frequency of surveillance required.*    Further management decisions will depend on results of further testing and patient's response to treatment.   Bonnielee Haff  [facility] Pager [phone]  If 7PM-7AM, please contact night-coverage [email or web address] Password [email or web address]  09/11/2017, 10:44 AM

## 2017-09-11 NOTE — ED Notes (Signed)
Pt stated, "I was here for the same thing yesterday, and that Toradol that they gave me really worked. When I was sitting out in the lobby waiting for my ride, I think that's what caused this pain."

## 2017-09-11 NOTE — ED Triage Notes (Signed)
Comes via ems, working a few days ago and hurt her back. Was here yesterday for back pain. V/s on arrival 146/78, spo2 100, pluse 96 , rr18.

## 2017-09-11 NOTE — ED Notes (Signed)
[facility] call to give report but nurse is off the floor. Will call back.

## 2017-09-12 ENCOUNTER — Inpatient Hospital Stay (HOSPITAL_COMMUNITY): Payer: Self-pay

## 2017-09-12 DIAGNOSIS — A419 Sepsis, unspecified organism: Secondary | ICD-10-CM

## 2017-09-12 DIAGNOSIS — G061 Intraspinal abscess and granuloma: Secondary | ICD-10-CM

## 2017-09-12 DIAGNOSIS — R7881 Bacteremia: Secondary | ICD-10-CM

## 2017-09-12 DIAGNOSIS — F1990 Other psychoactive substance use, unspecified, uncomplicated: Secondary | ICD-10-CM

## 2017-09-12 DIAGNOSIS — R6520 Severe sepsis without septic shock: Secondary | ICD-10-CM

## 2017-09-12 DIAGNOSIS — J9859 Other diseases of mediastinum, not elsewhere classified: Secondary | ICD-10-CM | POA: Diagnosis present

## 2017-09-12 DIAGNOSIS — I361 Nonrheumatic tricuspid (valve) insufficiency: Secondary | ICD-10-CM

## 2017-09-12 DIAGNOSIS — I959 Hypotension, unspecified: Secondary | ICD-10-CM

## 2017-09-12 DIAGNOSIS — M546 Pain in thoracic spine: Secondary | ICD-10-CM

## 2017-09-12 DIAGNOSIS — B9561 Methicillin susceptible Staphylococcus aureus infection as the cause of diseases classified elsewhere: Secondary | ICD-10-CM | POA: Diagnosis present

## 2017-09-12 HISTORY — DX: Intraspinal abscess and granuloma: G06.1

## 2017-09-12 LAB — CBC
HEMATOCRIT: 33.8 % — AB (ref 36.0–46.0)
HEMOGLOBIN: 11.5 g/dL — AB (ref 12.0–15.0)
MCH: 30.9 pg (ref 26.0–34.0)
MCHC: 34 g/dL (ref 30.0–36.0)
MCV: 90.9 fL (ref 78.0–100.0)
Platelets: 292 10*3/uL (ref 150–400)
RBC: 3.72 MIL/uL — ABNORMAL LOW (ref 3.87–5.11)
RDW: 13.6 % (ref 11.5–15.5)
WBC: 19.2 10*3/uL — AB (ref 4.0–10.5)

## 2017-09-12 LAB — COMPREHENSIVE METABOLIC PANEL
ALBUMIN: 2.2 g/dL — AB (ref 3.5–5.0)
ALT: 20 U/L (ref 14–54)
ANION GAP: 8 (ref 5–15)
AST: 31 U/L (ref 15–41)
Alkaline Phosphatase: 131 U/L — ABNORMAL HIGH (ref 38–126)
BUN: 7 mg/dL (ref 6–20)
CHLORIDE: 103 mmol/L (ref 101–111)
CO2: 26 mmol/L (ref 22–32)
Calcium: 7.5 mg/dL — ABNORMAL LOW (ref 8.9–10.3)
Creatinine, Ser: 0.66 mg/dL (ref 0.44–1.00)
GFR calc Af Amer: >60 mL/min (ref >60)
GFR calc non Af Amer: >60 mL/min (ref >60)
GLUCOSE: 88 mg/dL (ref 65–99)
POTASSIUM: 3.7 mmol/L (ref 3.5–5.1)
Sodium: 137 mmol/L (ref 135–145)
Total Bilirubin: 1.4 mg/dL — ABNORMAL HIGH (ref 0.3–1.2)
Total Protein: 5.3 g/dL — ABNORMAL LOW (ref 6.5–8.1)

## 2017-09-12 LAB — ECHOCARDIOGRAM COMPLETE
AVLVOTPG: 6 mmHg
CHL CUP TV REG PEAK VELOCITY: 278 cm/s
E/e' ratio: 8.45
EWDT: 257 ms
FS: 34 % (ref 28–44)
Height: 66 in
IVS/LV PW RATIO, ED: 1.03
LA ID, A-P, ES: 35 mm
LA diam index: 1.6 cm/m2
LA vol A4C: 54.7 ml
LAVOL: 44.6 mL
LAVOLIN: 20.4 mL/m2
LDCA: 4.52 cm2
LEFT ATRIUM END SYS DIAM: 35 mm
LV E/e' medial: 8.45
LV E/e'average: 8.45
LV e' LATERAL: 9.38 cm/s
LVOT SV: 113 mL
LVOT VTI: 25 cm
LVOT diameter: 24 mm
LVOT peak vel: 118 cm/s
Lateral S' vel: 14.5 cm/s
MV Dec: 257
MV Peak grad: 3 mmHg
MVPKAVEL: 77.1 m/s
MVPKEVEL: 79.3 m/s
PW: 12.5 mm — AB (ref 0.6–1.1)
RV TAPSE: 22 mm
RV sys press: 39 mmHg
TDI e' lateral: 9.38
TDI e' medial: 6.45
TR max vel: 278 cm/s
Weight: 3968 oz

## 2017-09-12 LAB — MAGNESIUM: MAGNESIUM: 2 mg/dL (ref 1.7–2.4)

## 2017-09-12 LAB — BLOOD CULTURE ID PANEL (REFLEXED)
Acinetobacter baumannii: NOT DETECTED
CANDIDA ALBICANS: NOT DETECTED
Candida glabrata: NOT DETECTED
Candida krusei: NOT DETECTED
Candida parapsilosis: NOT DETECTED
Candida tropicalis: NOT DETECTED
ENTEROBACTER CLOACAE COMPLEX: NOT DETECTED
ENTEROBACTERIACEAE SPECIES: NOT DETECTED
ENTEROCOCCUS SPECIES: NOT DETECTED
ESCHERICHIA COLI: NOT DETECTED
Haemophilus influenzae: NOT DETECTED
Klebsiella oxytoca: NOT DETECTED
Klebsiella pneumoniae: NOT DETECTED
LISTERIA MONOCYTOGENES: NOT DETECTED
Methicillin resistance: NOT DETECTED
NEISSERIA MENINGITIDIS: NOT DETECTED
Proteus species: NOT DETECTED
Pseudomonas aeruginosa: NOT DETECTED
STREPTOCOCCUS AGALACTIAE: NOT DETECTED
STREPTOCOCCUS PNEUMONIAE: NOT DETECTED
STREPTOCOCCUS PYOGENES: NOT DETECTED
STREPTOCOCCUS SPECIES: NOT DETECTED
Serratia marcescens: NOT DETECTED
Staphylococcus aureus (BCID): DETECTED — AB
Staphylococcus species: DETECTED — AB

## 2017-09-12 LAB — DIFFERENTIAL
BASOS PCT: 0 %
Basophils Absolute: 0 10*3/uL (ref 0.0–0.1)
EOS ABS: 0.2 10*3/uL (ref 0.0–0.7)
EOS PCT: 1 %
LYMPHS ABS: 0.6 10*3/uL — AB (ref 0.7–4.0)
Lymphocytes Relative: 3 %
Monocytes Absolute: 1.8 10*3/uL — ABNORMAL HIGH (ref 0.1–1.0)
Monocytes Relative: 10 %
NEUTROS PCT: 86 %
Neutro Abs: 15.8 10*3/uL — ABNORMAL HIGH (ref 1.7–7.7)

## 2017-09-12 LAB — PROCALCITONIN: PROCALCITONIN: 2.18 ng/mL

## 2017-09-12 LAB — LACTIC ACID, PLASMA: Lactic Acid, Venous: 2.9 mmol/L (ref 0.5–1.9)

## 2017-09-12 MED ORDER — LOPERAMIDE HCL 2 MG PO CAPS: 2.0000 mg | ORAL_CAPSULE | ORAL | Status: DC | PRN

## 2017-09-12 MED ORDER — NAPROXEN 250 MG PO TABS
500.0000 mg | ORAL_TABLET | Freq: Two times a day (BID) | ORAL | Status: DC | PRN
  Administered 2017-09-16: 500 mg via ORAL
  Filled 2017-09-12 (×2): qty 2

## 2017-09-12 MED ORDER — OXYCODONE-ACETAMINOPHEN 5-325 MG PO TABS
1.0000 | ORAL_TABLET | ORAL | Status: DC | PRN
  Administered 2017-09-12 – 2017-09-13 (×6): 2 via ORAL
  Filled 2017-09-12 (×6): qty 2

## 2017-09-12 MED ORDER — HYDROXYZINE HCL 25 MG PO TABS
25.0000 mg | ORAL_TABLET | Freq: Four times a day (QID) | ORAL | Status: AC | PRN
  Administered 2017-09-13 – 2017-09-17 (×5): 25 mg via ORAL
  Filled 2017-09-12 (×6): qty 1

## 2017-09-12 MED ORDER — SODIUM CHLORIDE 0.9 % IV BOLUS (SEPSIS)
1000.0000 mL | Freq: Once | INTRAVENOUS | Status: AC
  Administered 2017-09-12: 1000 mL via INTRAVENOUS

## 2017-09-12 MED ORDER — CEFAZOLIN SODIUM-DEXTROSE 2-4 GM/100ML-% IV SOLN
2.0000 g | Freq: Three times a day (TID) | INTRAVENOUS | Status: DC
  Administered 2017-09-12 – 2017-09-13 (×3): 2 g via INTRAVENOUS
  Filled 2017-09-12 (×7): qty 100

## 2017-09-12 MED ORDER — ONDANSETRON HCL 4 MG PO TABS
4.0000 mg | ORAL_TABLET | Freq: Four times a day (QID) | ORAL | Status: DC | PRN
  Administered 2017-09-27 – 2017-11-10 (×7): 4 mg via ORAL
  Filled 2017-09-12 (×8): qty 1

## 2017-09-12 MED ORDER — ONDANSETRON HCL 4 MG/2ML IJ SOLN: 4.0000 mg | Freq: Four times a day (QID) | INTRAMUSCULAR | Status: DC | PRN

## 2017-09-12 MED ORDER — ONDANSETRON 4 MG PO TBDP
4.0000 mg | ORAL_TABLET | Freq: Four times a day (QID) | ORAL | Status: DC | PRN
  Administered 2017-09-23 – 2017-10-16 (×9): 4 mg via ORAL
  Filled 2017-09-12 (×10): qty 1

## 2017-09-12 MED ORDER — METHOCARBAMOL 500 MG PO TABS
500.0000 mg | ORAL_TABLET | Freq: Three times a day (TID) | ORAL | Status: AC | PRN
  Administered 2017-09-16 – 2017-09-17 (×2): 500 mg via ORAL
  Filled 2017-09-12 (×2): qty 1

## 2017-09-12 MED ORDER — DICYCLOMINE HCL 20 MG PO TABS
20.0000 mg | ORAL_TABLET | Freq: Four times a day (QID) | ORAL | Status: DC | PRN
  Filled 2017-09-12: qty 1

## 2017-09-12 MED ORDER — SODIUM CHLORIDE 0.9 % IV SOLN: INTRAVENOUS | Status: DC

## 2017-09-12 MED ORDER — ONDANSETRON 4 MG PO TBDP: 4.0000 mg | ORAL_TABLET | Freq: Four times a day (QID) | ORAL | Status: DC | PRN

## 2017-09-12 MED ORDER — MORPHINE SULFATE (PF) 4 MG/ML IV SOLN
2.0000 mg | INTRAVENOUS | Status: DC | PRN
  Administered 2017-09-15 – 2017-09-18 (×7): 2 mg via INTRAVENOUS
  Filled 2017-09-12 (×7): qty 1

## 2017-09-12 MED ORDER — ONDANSETRON HCL 4 MG/2ML IJ SOLN
4.0000 mg | Freq: Four times a day (QID) | INTRAMUSCULAR | Status: DC | PRN
  Administered 2017-09-24 – 2017-11-13 (×24): 4 mg via INTRAVENOUS
  Filled 2017-09-12 (×26): qty 2

## 2017-09-12 NOTE — Progress Notes (Signed)
CRITICAL VALUE ALERT  Critical Value:  Lactic Acid 2.9  Date & Time Notied:  09/12/2017 1020  Provider Notified: Grandville Silos, D V  Orders Received/Actions taken: No new orders at this time.

## 2017-09-12 NOTE — Progress Notes (Signed)
Pt was scheduled to have an MRI w/ contrast today but Radiology called and said we have to wait 48 hrs before the MRI bc she had contrast yesterday.  Dr Grandville Silos notified and stated that tomorrow would be fine and to make sure it does not get cancelled.  MRI was notified of this.

## 2017-09-12 NOTE — Progress Notes (Signed)
[name]   DOB:Sep 23, 1966   KG#:[id number]   JSE#:[id number]  Subjective:  Much more alert this AM; tells me the syringes found in her possession belonged to her room-mate, who uses heroin--she states she takes away her syringes and disposes of them; she denies recent drug use (but see urine drug scree results from 3 and 5 months ago). She states she is in pain. 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 No family in room   Objective: middle aged White woman examined in bed Vitals:   09/12/17 0700 09/12/17 0800  BP: (!) 104/57 (!) 100/45  Pulse: 67   Resp: (!) 24 (!) 27  Temp:    SpO2: 97%     Body mass index is 39.71 kg/m.  Intake/Output Summary (Last 24 hours) at 09/12/2017 0839 Last data filed at 09/12/2017 0630 Gross per 24 hour  Intake 2501.67 ml  Output 50 ml  Net 2451.67 ml    CBG (last 3)  No results for input(s): GLUCAP in the last 72 hours.   Labs:  Lab Results  Component Value Date   WBC 19.2 (H) 09/12/2017   HGB 11.5 (L) 09/12/2017   HCT 33.8 (L) 09/12/2017   MCV 90.9 09/12/2017   PLT 292 09/12/2017   NEUTROABS 14.4 (H) 09/11/2017    '@LASTCHEMISTRY'$ @  Urine Studies No results for input(s): UHGB, CRYS in the last 72 hours.  Invalid input(s): UACOL, UAPR, USPG, UPH, UTP, UGL, UKET, UBIL, UNIT, UROB, ULEU, UEPI, UWBC, URBC, UBAC, CAST, [place], [place]  Basic Metabolic Panel: Recent Labs  Lab 09/10/17 0245 09/10/17 0404 09/11/17 0732 09/12/17 0312  NA 132*  --  131* 137  K 2.6*  --  2.9* 3.7  CL 98*  --  94* 103  CO2 24  --  30 26  GLUCOSE 111*  --  109* 88  BUN 9  --  9 7  CREATININE 0.88  --  0.80 0.66  CALCIUM 8.0*  --  8.3* 7.5*  MG  --  1.9 1.8  --    GFR Estimated Creatinine Clearance: 108.9 mL/min (by C-G formula based on SCr of 0.66 mg/dL). Liver Function Tests: Recent Labs  Lab 09/10/17 0245 09/12/17 0312  AST 32 31  ALT 28 20  ALKPHOS 84 131*  BILITOT 1.1 1.4*  PROT 6.6 5.3*  ALBUMIN 3.0*  2.2*   No results for input(s): LIPASE, AMYLASE in the last 168 hours. No results for input(s): AMMONIA in the last 168 hours. Coagulation profile No results for input(s): INR, PROTIME in the last 168 hours.  CBC: Recent Labs  Lab 09/10/17 0245 09/11/17 0732 09/12/17 0312  WBC 18.9* 17.1* 19.2*  NEUTROABS 16.0* 14.4*  --   HGB 12.2 12.1 11.5*  HCT 34.3* 34.2* 33.8*  MCV 88.9 89.3 90.9  PLT 334 383 292   Cardiac Enzymes: Recent Labs  Lab 09/11/17 0732  CKTOTAL 198   BNP: Invalid input(s): POCBNP CBG: No results for input(s): GLUCAP in the last 168 hours. D-Dimer No results for input(s): DDIMER in the last 72 hours. Hgb A1c No results for input(s): HGBA1C in the last 72 hours. Lipid Profile No results for input(s): CHOL, HDL, LDLCALC, TRIG, CHOLHDL, LDLDIRECT in the last 72 hours. Thyroid function studies No results for input(s): TSH, T4TOTAL, T3FREE, THYROIDAB in the last 72 hours.  Invalid input(s): FREET3 Anemia work up No results for input(s): VITAMINB12, FOLATE, FERRITIN, TIBC, IRON, RETICCTPCT in the last 72 hours. Microbiology Recent Results (from  the past 240 hour(s))  Urine culture     Status: Abnormal   Collection Time: 09/10/17 12:57 AM  Result Value Ref Range Status   Specimen Description URINE, CLEAN CATCH  Final   Special Requests NONE  Final   Culture MULTIPLE SPECIES PRESENT, SUGGEST RECOLLECTION (A)  Final   Report Status 09/11/2017 FINAL  Final  Wet prep, genital     Status: Abnormal   Collection Time: 09/10/17  4:20 AM  Result Value Ref Range Status   Yeast Wet Prep HPF POC NONE SEEN NONE SEEN Final   Trich, Wet Prep NONE SEEN NONE SEEN Final   Clue Cells Wet Prep HPF POC NONE SEEN NONE SEEN Final   WBC, Wet Prep HPF POC FEW (A) NONE SEEN Final   Sperm NONE SEEN  Final  Blood culture (routine x 2)     Status: None (Preliminary result)   Collection Time: 09/11/17  9:20 AM  Result Value Ref Range Status   Specimen Description BLOOD RIGHT  ANTECUBITAL  Final   Special Requests   Final    BOTTLES DRAWN AEROBIC AND ANAEROBIC Blood Culture adequate volume   Culture  Setup Time   Final    GRAM POSITIVE COCCI IN CLUSTERS IN [place] AEROBIC AND ANAEROBIC BOTTLES CRITICAL RESULT CALLED TO, READ BACK BY AND VERIFIED WITH: Novella Rob [id number] 09/12/2017 Mena Goes Performed at [facility], [address], [place], [place] [address]    Culture GRAM POSITIVE COCCI  Final   Report Status PENDING  Incomplete  Blood Culture ID Panel (Reflexed)     Status: Abnormal   Collection Time: 09/11/17  9:20 AM  Result Value Ref Range Status   Enterococcus species NOT DETECTED NOT DETECTED Final   Listeria monocytogenes NOT DETECTED NOT DETECTED Final   Staphylococcus species DETECTED (A) NOT DETECTED Final    Comment: CRITICAL RESULT CALLED TO, READ BACK BY AND VERIFIED WITH: B. GREEN,PHARMD [id number] 09/12/2017 T. TYSOR    Staphylococcus aureus DETECTED (A) NOT DETECTED Final    Comment: Methicillin (oxacillin) susceptible Staphylococcus aureus (MSSA). Preferred therapy is anti staphylococcal beta lactam antibiotic (Cefazolin or Nafcillin), unless clinically contraindicated. CRITICAL RESULT CALLED TO, READ BACK BY AND VERIFIED WITH: B. GREEN,PHARMD [id number] 09/12/2017 T. TYSOR    Methicillin resistance NOT DETECTED NOT DETECTED Final   Streptococcus species NOT DETECTED NOT DETECTED Final   Streptococcus agalactiae NOT DETECTED NOT DETECTED Final   Streptococcus pneumoniae NOT DETECTED NOT DETECTED Final   Streptococcus pyogenes NOT DETECTED NOT DETECTED Final   Acinetobacter baumannii NOT DETECTED NOT DETECTED Final   Enterobacteriaceae species NOT DETECTED NOT DETECTED Final   Enterobacter cloacae complex NOT DETECTED NOT DETECTED Final   Escherichia coli NOT DETECTED NOT DETECTED Final   Klebsiella oxytoca NOT DETECTED NOT DETECTED Final   Klebsiella pneumoniae NOT DETECTED NOT DETECTED Final   Proteus species NOT DETECTED NOT DETECTED Final    Serratia marcescens NOT DETECTED NOT DETECTED Final   Haemophilus influenzae NOT DETECTED NOT DETECTED Final   Neisseria meningitidis NOT DETECTED NOT DETECTED Final   Pseudomonas aeruginosa NOT DETECTED NOT DETECTED Final   Candida albicans NOT DETECTED NOT DETECTED Final   Candida glabrata NOT DETECTED NOT DETECTED Final   Candida krusei NOT DETECTED NOT DETECTED Final   Candida parapsilosis NOT DETECTED NOT DETECTED Final   Candida tropicalis NOT DETECTED NOT DETECTED Final    Comment: Performed at [facility], [address], [place], [place] [address]  Blood culture (routine  x 2)     Status: None (Preliminary result)   Collection Time: 09/11/17  9:33 AM  Result Value Ref Range Status   Specimen Description BLOOD BLOOD LEFT HAND  Final   Special Requests   Final    BOTTLES DRAWN AEROBIC AND ANAEROBIC Blood Culture adequate volume   Culture  Setup Time   Final    GRAM POSITIVE COCCI IN CLUSTERS IN [place] AEROBIC AND ANAEROBIC BOTTLES CRITICAL VALUE NOTED.  VALUE IS CONSISTENT WITH PREVIOUSLY REPORTED AND CALLED VALUE. Performed at [facility], [place] [address] [place], [place] [address]    Culture GRAM POSITIVE COCCI  Final   Report Status PENDING  Incomplete  MRSA PCR Screening     Status: None   Collection Time: 09/11/17  1:03 PM  Result Value Ref Range Status   MRSA by PCR NEGATIVE NEGATIVE Final    Comment:        The GeneXpert MRSA Assay (FDA approved for NASAL specimens only), is one component of a comprehensive MRSA colonization surveillance program. It is not intended to diagnose MRSA infection nor to guide or monitor treatment for MRSA infections.       Studies:  Ct Chest W Contrast  Result Date: 09/11/2017 CLINICAL DATA:  Back pain for 6 weeks. Abnormal paravertebral/posterior mediastinal soft tissue on earlier abdominal CT. EXAM: CT CHEST WITH CONTRAST TECHNIQUE: Multidetector CT imaging of the chest was performed during intravenous contrast  administration. CONTRAST:  88m ISOVUE-300 IOPAMIDOL (ISOVUE-300) INJECTION 61% COMPARISON:  Abdominal CT earlier today. CT abdomen pelvis 06/25/2016. FINDINGS: Cardiovascular: Heart is borderline in size. Aorta is normal caliber. Mediastinum/Nodes: Abnormal soft tissue throughout the posterior mediastinum in the mid and lower chest extending into the paravertebral regions as seen on abdominal CT. There are borderline sized and mildly prominent scattered mediastinal lymph nodes. Right paratracheal lymph node has a short axis diameter of 9 mm. Left prevascular lymph node between the left common carotid and left subclavian artery has a short axis diameter of 10 mm. Other similarly sized and smaller prevascular, AP window and subcarinal lymph nodes. No axillary or hilar adenopathy. Lungs/Pleura: Trace left and small right pleural effusions. Right basilar airspace opacities likely reflect atelectasis. Lingular scarring is stable since prior study. Upper Abdomen: Mildly prominent upper abdominal retroperitoneal lymph nodes in the celiac axis and gastrohepatic ligament as seen on prior abdominal CT. Musculoskeletal: Chest wall soft tissues are unremarkable. No acute bony abnormality. No radiographic changes of osteomyelitis or discitis to explain the abnormal posterior mediastinal/ perivertebral soft tissue. IMPRESSION: Abnormal soft tissue in the posterior mediastinum and paravertebral regions in the mid and lower chest. Borderline mediastinal lymph nodes as described above. Findings concerning for possible lymphoma. Consider consultation with cardiothoracic surgery or Oncology. Small right pleural effusion and trace left pleural effusion. Borderline enlarged upper abdominal lymph nodes in the gastrohepatic ligament and celiac axis region. Borderline heart size. Right basilar airspace opacities, favor atelectasis. Electronically Signed   By: KRolm BaptiseM.D.   On: 09/11/2017 09:27   Mr Thoracic Spine W Wo  Contrast  Result Date: 09/11/2017 CLINICAL DATA:  Acute thoracic back pain. Elevated white count, CRP, ESR. EXAM: MRI THORACIC AND LUMBAR SPINE WITHOUT AND WITH CONTRAST TECHNIQUE: Multiplanar and multiecho pulse sequences of the thoracic and lumbar spine were obtained without and with intravenous contrast. CONTRAST:  234mMULTIHANCE GADOBENATE DIMEGLUMINE 529 MG/ML IV SOLN COMPARISON:  Chest CT from earlier today. FINDINGS: MRI THORACIC SPINE FINDINGS Alignment:  Normal Vertebrae: There is subtle  anterior corner T2 hyperintensity at T7-8 and T8-9. The anterior T7-8 disc is subtly T2 hyperintense. Ill-defined enhancing soft tissue intensity from T6-T9, maximal at the T7-8 level where there is hypoenhancing pockets about the disc space which may reflect collections. Also at T7-8 disc is ventral epidural thickening that extends cranially with fluid collection in the ventral epidural space reaching C7. This ventral epidural collection measures up to 5-6 mm AP. 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 No cord edema. This degree of paravertebral inflammation with limited disc involvement raises the possibility of the tuberculous or other atypical infection, but the abscess and clinical symptomatology favors pyogenic infection. Paravertebral thrombophlebitis is considered in addition to primary discitis. Cord:  As above Paraspinal and other soft tissues: Phlegmon as above Disc levels: No notable degenerative changes. MRI LUMBAR SPINE FINDINGS Segmentation:  Standard. Alignment:  Mild retrolisthesis at L2-3. Vertebrae: Status post L3-4 and L5-S1 discectomy with solid arthrodesis by CT. There is L3-S1 posterior arthrodesis. No evidence of discitis or osteomyelitis. No fracture or aggressive bone lesion. Conus medullaris: Extends to the L1 level and appears normal. Paraspinal and other soft tissues: Postoperative fatty atrophy of lower intrinsic  back muscles. Small volume chronic appearing fluid collections in the laminectomy defects of L3-4 and L4-5. No associated mass effect. Disc levels: T12- L1: Small central disc protrusion without impingement L1-L2: Spondylosis.  No impingement L2-L3: Degenerative disc narrowing and bulging. Facet arthropathy with gaping joints and mild listhesis. There is a down turning disc protrusion. Dorsal epidural fat expansion with moderate to advanced spinal stenosis. Bilateral noncompressive foraminal narrowing. L3-L4: Discectomy with solid arthrodesis by CT. No residual impingement L4-L5: Posterior arthrodesis.  No residual impingement. L5-S1:Discectomy with solid arthrodesis.  No residual impingement. These results were called by telephone at the time of interpretation on 09/11/2017 at 8:59 pm to [place], who verbally acknowledged these results. IMPRESSION: Thoracic spine: Previously noted paravertebral soft tissue is best attributed to phlegmon which is maximal around T7-8 where there is likely early discitis. Extending from T7-8 to C7 is a ventral epidural collection consistent with abscess. 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 Lumbar spine: 1. No acute finding. 2. L3-S1 solid arthrodesis. 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 Electronically Signed   By: Monte Fantasia M.D.   On: 09/11/2017 21:04   Mr Lumbar Spine W Wo Contrast  Result Date: 09/11/2017 CLINICAL DATA:  Acute thoracic back pain. Elevated white count, CRP, ESR. EXAM: MRI THORACIC AND LUMBAR SPINE WITHOUT AND WITH CONTRAST TECHNIQUE: Multiplanar and multiecho pulse sequences of the thoracic and lumbar spine were obtained without and with intravenous contrast. CONTRAST:  72m MULTIHANCE GADOBENATE DIMEGLUMINE 529 MG/ML IV SOLN COMPARISON:  Chest CT from earlier today. FINDINGS: MRI THORACIC SPINE FINDINGS Alignment:  Normal Vertebrae: There is subtle anterior  corner T2 hyperintensity at T7-8 and T8-9. The anterior T7-8 disc is subtly T2 hyperintense. Ill-defined enhancing soft tissue intensity from T6-T9, maximal at the T7-8 level where there is hypoenhancing pockets about the disc space which may reflect collections. Also at T7-8 disc is ventral epidural thickening that extends cranially with fluid collection in the ventral epidural space reaching C7. This ventral epidural collection measures up to 5-6 mm AP. 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 No cord edema. This degree of paravertebral inflammation with limited  disc involvement raises the possibility of the tuberculous or other atypical infection, but the abscess and clinical symptomatology favors pyogenic infection. Paravertebral thrombophlebitis is considered in addition to primary discitis. Cord:  As above Paraspinal and other soft tissues: Phlegmon as above Disc levels: No notable degenerative changes. MRI LUMBAR SPINE FINDINGS Segmentation:  Standard. Alignment:  Mild retrolisthesis at L2-3. Vertebrae: Status post L3-4 and L5-S1 discectomy with solid arthrodesis by CT. There is L3-S1 posterior arthrodesis. No evidence of discitis or osteomyelitis. No fracture or aggressive bone lesion. Conus medullaris: Extends to the L1 level and appears normal. Paraspinal and other soft tissues: Postoperative fatty atrophy of lower intrinsic back muscles. Small volume chronic appearing fluid collections in the laminectomy defects of L3-4 and L4-5. No associated mass effect. Disc levels: T12- L1: Small central disc protrusion without impingement L1-L2: Spondylosis.  No impingement L2-L3: Degenerative disc narrowing and bulging. Facet arthropathy with gaping joints and mild listhesis. There is a down turning disc protrusion. Dorsal epidural fat expansion with moderate to advanced spinal stenosis. Bilateral noncompressive foraminal narrowing. L3-L4:  Discectomy with solid arthrodesis by CT. No residual impingement L4-L5: Posterior arthrodesis.  No residual impingement. L5-S1:Discectomy with solid arthrodesis.  No residual impingement. These results were called by telephone at the time of interpretation on 09/11/2017 at 8:59 pm to [place], who verbally acknowledged these results. IMPRESSION: Thoracic spine: Previously noted paravertebral soft tissue is best attributed to phlegmon which is maximal around T7-8 where there is likely early discitis. Extending from T7-8 to C7 is a ventral epidural collection consistent with abscess. 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 Lumbar spine: 1. No acute finding. 2. L3-S1 solid arthrodesis. 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 Electronically Signed   By: Monte Fantasia M.D.   On: 09/11/2017 21:04   Ct Renal Stone Study  Result Date: 09/11/2017 CLINICAL DATA:  Flank pain, back pain EXAM: CT ABDOMEN AND PELVIS WITHOUT CONTRAST TECHNIQUE: Multidetector CT imaging of the abdomen and pelvis was performed following the standard protocol without IV contrast. COMPARISON:  06/25/2016 FINDINGS: Lower chest: Small right pleural effusion. Scarring in the lingula. Right basilar O atelectasis. Heart is normal size. Abnormal soft tissue is noted in the posterior mediastinum and paravertebral regions in the visualized lower chest, images 1 to 14. All all low all all Hepatobiliary: No focal hepatic abnormality. Gallbladder unremarkable. Pancreas: No focal abnormality or ductal dilatation. Spleen: No focal abnormality.  Normal size. Adrenals/Urinary Tract: No adrenal abnormality. No focal renal abnormality. No stones or hydronephrosis. Urinary bladder is unremarkable. Stomach/Bowel: Stomach, large and small bowel grossly unremarkable. Normal appendix Vascular/Lymphatic: Aorta is normal caliber. There are mildly prominent upper abdominal  retroperitoneal lymph nodes in the celiac axis region. Index celiac axis node has a short axis diameter of 10 mm on image 26. Reproductive: Prior hysterectomy. Cysts within the right ovary 4.1 x 3.1 cm. Left ovary unremarkable. Other: No free fluid or free air. Musculoskeletal: Postoperative changes from posterior fusion L3-S1. IMPRESSION: Abnormal posterior mediastinal/paravertebral soft tissue in the mid to lower chest. Associated small right pleural effusion. Mildly prominent upper abdominal retroperitoneal lymph nodes. This is of unknown etiology. Lymphoma is a possibility. Osteomyelitis or discitis is possible but felt less likely given the unremarkable appearance of the bony structures. I would recommend further evaluation with chest CT with IV contrast. Thoracic spine MRI may be helpful subsequently, but I want median with chest CT. Lingular scarring.  Right base atelectasis.  These results were called by telephone at the time of interpretation on 09/11/2017 at 8:27 am to Dr. Duffy Bruce , who verbally acknowledged these results. Electronically Signed   By: Rolm Baptise M.D.   On: 09/11/2017 08:28    Assessment: 51 y.o. [place] woman admitted with uncontrolled pain 08/11/2017, scans showing posterior mediastinal and paravertebral soft tissue masses as well as some adenopathy, with lymphoma in the differential-- however subsequent MRI of the thoraco-lumbar spine suggests phlegmon and blood culture is growing MSSA  Plan:  At this point the likelihood of cancer is low as it appears we may be dealing with infection perhaps related to her prior back surgery.   I will sign off. Please let me know if I can be of further help.   Chauncey Cruel, MD 09/12/2017  8:39 AM Medical Oncology and Hematology [facility] [address] [place], [place] [address] Tel. [phone]    Fax. [phone]

## 2017-09-12 NOTE — Progress Notes (Signed)
PROGRESS NOTE    [name]  YIR:[id number] DOB: [birth year] DOA: 09/11/2017 PCP: Marliss Coots, NP    Brief Narrative:  Patient is a 51 year old female history of IV drug use, chronic back pain status post previous back surgery, who presented to the ED with severe back and neck pain.  Patient found to have an abnormal urinalysis, MRI of the T and L-spine concerning for ventral epidural fluid collection/abscess.  Patient also noted to have MSSA bacteremia.  Patient placed empirically on IV antibiotics.  ID and neurosurgery consulted.  2D echo ordered.  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  Patient also presented with fevers with temp as high as 102, tachycardia, hypotension responding to IV fluids, significant leukocytosis, elevated CRP, lactic acid level of 1.3, urinalysis worrisome for UTI, MRI of the T and L-spine concerning for T7-8 to C7 ventral epidural collection consistent with abscess.  Patient also noted to have MSSA bacteremia.  Concern for also discitis.  Pressure responded to IV fluids.  Repeat blood cultures have been ordered.  Pro-calcitonin and lactic acid levels have been ordered.  2D echo pending to rule out endocarditis.  IV antibiotics have been narrowed to IV Ancef per ID recommendations.  Continue IV fluids, pain management.  Check a MRI of the C-spine per neurosurgical recommendations.  ID and neurosurgery following and appreciate their input and  recommendations.  We will transfer to [facility] per neurosurgical recommendations.  2.  MSSA bacteremia Patient with history of IV drug use.  2D echo pending.  Repeat blood cultures have been obtained and are pending.  IV antibiotics have been narrowed down to IV Ancef.  ID has been consulted and are following.  3.  Hypotension Likely secondary to problem #1.  Blood pressure responded to IV fluids.  Continue IV fluids and empiric IV antibiotics.  4.  Lymphadenopathy/mediastinal and paravertebral soft tissue masses Patient was seen in consultation by hematology oncology and likelihood of cancer was low as patient is septic with epidural abscess, MSSA bacteremia.  Oncology has assessed the patient and has signed off and appreciate their input and recommendations.  5.  Polysubstance abuse/IV drug use Patient states she has been clean since April 2018.  Per nursing patient noted on admission to have a syringe with clear yellow fluid in it and a needle attached along with a tourniquet noted in a towel which was confiscated.  Patient noted to have 2 more syringes found as well as a Narcan kit.  Monitor closely for withdrawal symptoms.  Placed on the clonidine detox protocol.  Hold clonidine and protocol for now secondary to borderline blood pressure.  Social work [place].   DVT prophylaxis: SCDs Code Status: Full Family Communication: Updated patient.  No family at bedside. Disposition Plan: Transfer to [facility] progressive in unit [place]   Consultants:   Hematology/oncology Dr.Magrinat 09/11/2017  Infectious disease: Dr. Tommy Medal 09/12/2017  Neurosurgery: Dr. Kathyrn Sheriff 09/12/2017  Procedures:   Lower extremity Dopplers 09/11/2017  2D echo 09/12/2017  CT chest 09/11/2017  CT stone protocol 09/11/2017  MRI of the L and T-spine 09/11/2017  MRI of the C-spine pending    Antimicrobials:   IV Ancef 09/12/2017  IV cefepime 09/11/2017>>>> 09/12/2017  IV vancomycin  09/11/2017>>>>> 09/12/2017   Subjective: Patient complaining of back and neck pain.  Patient denies any chest pain.  Patient with some shortness of breath which she states is associated with back pain.  Patient denies any recent IV drug use states last use was April 2018.  Objective: Vitals:   09/12/17 0420 09/12/17 0500 09/12/17 0700 09/12/17 0800  BP:  (!) 92/52 (!) 104/57 (!) 100/45  Pulse:  73 67   Resp:  (!) 27 (!) 24 (!) 27  Temp: 98.3 F (36.8 C)   97.6 F (36.4 C)  TempSrc: Oral   Axillary  SpO2:  95% 97%   Weight:      Height:        Intake/Output Summary (Last 24 hours) at 09/12/2017 1001 Last data filed at 09/12/2017 0630 Gross per 24 hour  Intake 2501.67 ml  Output 50 ml  Net 2451.67 ml   Filed Weights   09/11/17 1228  Weight: 111.6 kg (246 lb 0.5 oz)    Examination:  General exam: Alert. Respiratory system: Clear to auscultation. No wheezes, no rhonchi, no crackles. Respiratory effort normal. Cardiovascular system: S1 & S2 heard, RRR. No JVD, murmurs, rubs, gallops or clicks. No pedal edema. Gastrointestinal system: Abdomen is nondistended, soft and nontender. No organomegaly or masses felt. Normal bowel sounds heard. Central nervous system: Alert and oriented.  Renal nerves II through XII grossly intact.  Sensation intact.  Unable to elicit reflexes symmetrically and diffusely.  Decreased strength in the left lower extremity.  Gait not tested secondary to safety.  Extremities: Left lower extremity with 3/5 strength.  Right lower extremity 5/5 strength.  Left lower extremity with a ankle bracelet Skin: No rashes, lesions or ulcers Psychiatry: Judgement and insight appear normal. Mood & affect appropriate.     Data Reviewed: I have personally reviewed following labs and imaging studies  CBC: Recent Labs  Lab 09/10/17 0245 09/11/17 0732 09/12/17 0312 09/12/17 0910  WBC 18.9* 17.1* 19.2*  --   NEUTROABS 16.0* 14.4*  --  15.8*  HGB 12.2 12.1 11.5*  --    HCT 34.3* 34.2* 33.8*  --   MCV 88.9 89.3 90.9  --   PLT 334 383 292  --    Basic Metabolic Panel: Recent Labs  Lab 09/10/17 0245 09/10/17 0404 09/11/17 0732 09/12/17 0312 09/12/17 0910  NA 132*  --  131* 137  --   K 2.6*  --  2.9* 3.7  --   CL 98*  --  94* 103  --   CO2 24  --  30 26  --   GLUCOSE 111*  --  109* 88  --   BUN 9  --  9 7  --   CREATININE 0.88  --  0.80 0.66  --   CALCIUM 8.0*  --  8.3* 7.5*  --   MG  --  1.9 1.8  --  2.0   GFR: Estimated Creatinine Clearance: 108.9 mL/min (by C-G formula based on SCr of 0.66 mg/dL). Liver Function Tests: Recent Labs  Lab 09/10/17 0245 09/12/17 0312  AST 32 31  ALT 28 20  ALKPHOS 84 131*  BILITOT 1.1 1.4*  PROT 6.6 5.3*  ALBUMIN 3.0* 2.2*   No results for input(s): LIPASE, AMYLASE in the last 168 hours. No results for input(s): AMMONIA in the last 168 hours. Coagulation Profile: No results for input(s): INR, PROTIME in the last 168 hours. Cardiac Enzymes: Recent Labs  Lab 09/11/17 0732  CKTOTAL 198   BNP (last 3 results) No results for input(s): PROBNP in the last 8760 hours. HbA1C: No results for input(s): HGBA1C in the last 72 hours. CBG: No results for input(s): GLUCAP in the last 168 hours. Lipid Profile: No results for input(s): CHOL, HDL, LDLCALC, TRIG, CHOLHDL, LDLDIRECT in the last 72 hours. Thyroid Function Tests: No results for input(s): TSH, T4TOTAL, FREET4, T3FREE, THYROIDAB in the last 72 hours. Anemia Panel: No results for input(s): VITAMINB12, FOLATE, FERRITIN, TIBC, IRON, RETICCTPCT in the last 72 hours. Sepsis Labs: Recent Labs  Lab 09/11/17 1343  LATICACIDVEN 1.3    Recent Results (from the past 240 hour(s))  Urine culture     Status: Abnormal   Collection Time: 09/10/17 12:57 AM  Result Value Ref Range Status   Specimen Description URINE, CLEAN CATCH  Final   Special Requests NONE  Final   Culture MULTIPLE SPECIES PRESENT, SUGGEST RECOLLECTION (A)  Final   Report Status  09/11/2017 FINAL  Final  Wet prep, genital     Status: Abnormal   Collection Time: 09/10/17  4:20 AM  Result Value Ref Range Status   Yeast Wet Prep HPF POC NONE SEEN NONE SEEN Final   Trich, Wet Prep NONE SEEN NONE SEEN Final   Clue Cells Wet Prep HPF POC NONE SEEN NONE SEEN Final   WBC, Wet Prep HPF POC FEW (A) NONE SEEN Final   Sperm NONE SEEN  Final  Blood culture (routine x 2)     Status: None (Preliminary result)   Collection Time: 09/11/17  9:20 AM  Result Value Ref Range Status   Specimen Description BLOOD RIGHT ANTECUBITAL  Final   Special Requests   Final    BOTTLES DRAWN AEROBIC AND ANAEROBIC Blood Culture adequate volume   Culture  Setup Time   Final    GRAM POSITIVE COCCI IN CLUSTERS IN BOTH AEROBIC AND ANAEROBIC BOTTLES CRITICAL RESULT CALLED TO, READ BACK BY AND VERIFIED WITH: Novella Rob [id number] 09/12/2017 Mena Goes Performed at [facility], [address] [place], [place] [address]    Culture GRAM POSITIVE COCCI  Final   Report Status PENDING  Incomplete  Blood Culture ID Panel (Reflexed)     Status: Abnormal   Collection Time: 09/11/17  9:20 AM  Result Value Ref Range Status   Enterococcus species NOT DETECTED NOT DETECTED Final   Listeria monocytogenes NOT DETECTED NOT DETECTED Final   Staphylococcus species DETECTED (A) NOT DETECTED Final    Comment: CRITICAL RESULT CALLED TO, READ BACK BY AND VERIFIED WITH: B. GREEN,PHARMD [id number] 09/12/2017 T. TYSOR    Staphylococcus aureus DETECTED (A) NOT DETECTED Final    Comment: Methicillin (oxacillin) susceptible Staphylococcus aureus (MSSA). Preferred therapy is anti staphylococcal beta lactam antibiotic (Cefazolin or Nafcillin), unless clinically contraindicated. CRITICAL RESULT CALLED TO, READ BACK BY AND VERIFIED WITH: B. GREEN,PHARMD [id number] 09/12/2017 T. TYSOR    Methicillin resistance NOT DETECTED NOT DETECTED Final   Streptococcus species NOT DETECTED NOT DETECTED Final   Streptococcus agalactiae NOT  DETECTED NOT DETECTED Final   Streptococcus pneumoniae NOT DETECTED NOT DETECTED Final   Streptococcus pyogenes NOT DETECTED NOT DETECTED Final   Acinetobacter baumannii NOT DETECTED NOT DETECTED Final  Enterobacteriaceae species NOT DETECTED NOT DETECTED Final   Enterobacter cloacae complex NOT DETECTED NOT DETECTED Final   Escherichia coli NOT DETECTED NOT DETECTED Final   Klebsiella oxytoca NOT DETECTED NOT DETECTED Final   Klebsiella pneumoniae NOT DETECTED NOT DETECTED Final   Proteus species NOT DETECTED NOT DETECTED Final   Serratia marcescens NOT DETECTED NOT DETECTED Final   Haemophilus influenzae NOT DETECTED NOT DETECTED Final   Neisseria meningitidis NOT DETECTED NOT DETECTED Final   Pseudomonas aeruginosa NOT DETECTED NOT DETECTED Final   Candida albicans NOT DETECTED NOT DETECTED Final   Candida glabrata NOT DETECTED NOT DETECTED Final   Candida krusei NOT DETECTED NOT DETECTED Final   Candida parapsilosis NOT DETECTED NOT DETECTED Final   Candida tropicalis NOT DETECTED NOT DETECTED Final    Comment: Performed at [facility], [place] [address], [place], [place] [address]  Blood culture (routine x 2)     Status: None (Preliminary result)   Collection Time: 09/11/17  9:33 AM  Result Value Ref Range Status   Specimen Description BLOOD BLOOD LEFT HAND  Final   Special Requests   Final    BOTTLES DRAWN AEROBIC AND ANAEROBIC Blood Culture adequate volume   Culture  Setup Time   Final    GRAM POSITIVE COCCI IN CLUSTERS IN BOTH AEROBIC AND ANAEROBIC BOTTLES CRITICAL VALUE NOTED.  VALUE IS CONSISTENT WITH PREVIOUSLY REPORTED AND CALLED VALUE. Performed at [facility], [place] [address], [place], [place] [address]    Culture GRAM POSITIVE COCCI  Final   Report Status PENDING  Incomplete  MRSA PCR Screening     Status: None   Collection Time: 09/11/17  1:03 PM  Result Value Ref Range Status   MRSA by PCR NEGATIVE NEGATIVE Final    Comment:        The GeneXpert MRSA  Assay (FDA approved for NASAL specimens only), is one component of a comprehensive MRSA colonization surveillance program. It is not intended to diagnose MRSA infection nor to guide or monitor treatment for MRSA infections.          Radiology Studies: Ct Chest W Contrast  Result Date: 09/11/2017 CLINICAL DATA:  Back pain for 6 weeks. Abnormal paravertebral/posterior mediastinal soft tissue on earlier abdominal CT. EXAM: CT CHEST WITH CONTRAST TECHNIQUE: Multidetector CT imaging of the chest was performed during intravenous contrast administration. CONTRAST:  36m ISOVUE-300 IOPAMIDOL (ISOVUE-300) INJECTION 61% COMPARISON:  Abdominal CT earlier today. CT abdomen pelvis 06/25/2016. FINDINGS: Cardiovascular: Heart is borderline in size. Aorta is normal caliber. Mediastinum/Nodes: Abnormal soft tissue throughout the posterior mediastinum in the mid and lower chest extending into the paravertebral regions as seen on abdominal CT. There are borderline sized and mildly prominent scattered mediastinal lymph nodes. Right paratracheal lymph node has a short axis diameter of 9 mm. Left prevascular lymph node between the left common carotid and left subclavian artery has a short axis diameter of 10 mm. Other similarly sized and smaller prevascular, AP window and subcarinal lymph nodes. No axillary or hilar adenopathy. Lungs/Pleura: Trace left and small right pleural effusions. Right basilar airspace opacities likely reflect atelectasis. Lingular scarring is stable since prior study. Upper Abdomen: Mildly prominent upper abdominal retroperitoneal lymph nodes in the celiac axis and gastrohepatic ligament as seen on prior abdominal CT. Musculoskeletal: Chest wall soft tissues are unremarkable. No acute bony abnormality. No radiographic changes of osteomyelitis or discitis to explain the abnormal posterior mediastinal/ perivertebral soft tissue. IMPRESSION: Abnormal soft tissue in the posterior  mediastinum and  paravertebral regions in the mid and lower chest. Borderline mediastinal lymph nodes as described above. Findings concerning for possible lymphoma. Consider consultation with cardiothoracic surgery or Oncology. Small right pleural effusion and trace left pleural effusion. Borderline enlarged upper abdominal lymph nodes in the gastrohepatic ligament and celiac axis region. Borderline heart size. Right basilar airspace opacities, favor atelectasis. Electronically Signed   By: Rolm Baptise M.D.   On: 09/11/2017 09:27   Mr Thoracic Spine W Wo Contrast  Result Date: 09/11/2017 CLINICAL DATA:  Acute thoracic back pain. Elevated white count, CRP, ESR. EXAM: MRI THORACIC AND LUMBAR SPINE WITHOUT AND WITH CONTRAST TECHNIQUE: Multiplanar and multiecho pulse sequences of the thoracic and lumbar spine were obtained without and with intravenous contrast. CONTRAST:  75m MULTIHANCE GADOBENATE DIMEGLUMINE 529 MG/ML IV SOLN COMPARISON:  Chest CT from earlier today. FINDINGS: MRI THORACIC SPINE FINDINGS Alignment:  Normal Vertebrae: There is subtle anterior corner T2 hyperintensity at T7-8 and T8-9. The anterior T7-8 disc is subtly T2 hyperintense. Ill-defined enhancing soft tissue intensity from T6-T9, maximal at the T7-8 level where there is hypoenhancing pockets about the disc space which may reflect collections. Also at T7-8 disc is ventral epidural thickening that extends cranially with fluid collection in the ventral epidural space reaching C7. This ventral epidural collection measures up to 5-6 mm AP. 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 No cord edema. This degree of paravertebral inflammation with limited disc involvement raises the possibility of the tuberculous or other atypical infection, but the abscess and clinical symptomatology favors pyogenic infection. Paravertebral thrombophlebitis is considered in addition to primary discitis.  Cord:  As above Paraspinal and other soft tissues: Phlegmon as above Disc levels: No notable degenerative changes. MRI LUMBAR SPINE FINDINGS Segmentation:  Standard. Alignment:  Mild retrolisthesis at L2-3. Vertebrae: Status post L3-4 and L5-S1 discectomy with solid arthrodesis by CT. There is L3-S1 posterior arthrodesis. No evidence of discitis or osteomyelitis. No fracture or aggressive bone lesion. Conus medullaris: Extends to the L1 level and appears normal. Paraspinal and other soft tissues: Postoperative fatty atrophy of lower intrinsic back muscles. Small volume chronic appearing fluid collections in the laminectomy defects of L3-4 and L4-5. No associated mass effect. Disc levels: T12- L1: Small central disc protrusion without impingement L1-L2: Spondylosis.  No impingement L2-L3: Degenerative disc narrowing and bulging. Facet arthropathy with gaping joints and mild listhesis. There is a down turning disc protrusion. Dorsal epidural fat expansion with moderate to advanced spinal stenosis. Bilateral noncompressive foraminal narrowing. L3-L4: Discectomy with solid arthrodesis by CT. No residual impingement L4-L5: Posterior arthrodesis.  No residual impingement. L5-S1:Discectomy with solid arthrodesis.  No residual impingement. These results were called by telephone at the time of interpretation on 09/11/2017 at 8:59 pm to [place] who verbally acknowledged these results. IMPRESSION: Thoracic spine: Previously noted paravertebral soft tissue is best attributed to phlegmon which is maximal around T7-8 where there is likely early discitis. Extending from T7-8 to C7 is a ventral epidural collection consistent with abscess. 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 Lumbar spine: 1. No acute finding. 2. L3-S1 solid arthrodesis. 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 Electronically Signed   By: JMonte FantasiaM.D.   On: 09/11/2017  21:04   Mr Lumbar Spine W Wo Contrast  Result Date: 09/11/2017 CLINICAL DATA:  Acute thoracic back pain. Elevated white count, CRP, ESR.  EXAM: MRI THORACIC AND LUMBAR SPINE WITHOUT AND WITH CONTRAST TECHNIQUE: Multiplanar and multiecho pulse sequences of the thoracic and lumbar spine were obtained without and with intravenous contrast. CONTRAST:  68m MULTIHANCE GADOBENATE DIMEGLUMINE 529 MG/ML IV SOLN COMPARISON:  Chest CT from earlier today. FINDINGS: MRI THORACIC SPINE FINDINGS Alignment:  Normal Vertebrae: There is subtle anterior corner T2 hyperintensity at T7-8 and T8-9. The anterior T7-8 disc is subtly T2 hyperintense. Ill-defined enhancing soft tissue intensity from T6-T9, maximal at the T7-8 level where there is hypoenhancing pockets about the disc space which may reflect collections. Also at T7-8 disc is ventral epidural thickening that extends cranially with fluid collection in the ventral epidural space reaching C7. This ventral epidural collection measures up to 5-6 mm AP. 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 No cord edema. This degree of paravertebral inflammation with limited disc involvement raises the possibility of the tuberculous or other atypical infection, but the abscess and clinical symptomatology favors pyogenic infection. Paravertebral thrombophlebitis is considered in addition to primary discitis. Cord:  As above Paraspinal and other soft tissues: Phlegmon as above Disc levels: No notable degenerative changes. MRI LUMBAR SPINE FINDINGS Segmentation:  Standard. Alignment:  Mild retrolisthesis at L2-3. Vertebrae: Status post L3-4 and L5-S1 discectomy with solid arthrodesis by CT. There is L3-S1 posterior arthrodesis. No evidence of discitis or osteomyelitis. No fracture or aggressive bone lesion. Conus medullaris: Extends to the L1 level and appears normal. Paraspinal and other soft tissues: Postoperative  fatty atrophy of lower intrinsic back muscles. Small volume chronic appearing fluid collections in the laminectomy defects of L3-4 and L4-5. No associated mass effect. Disc levels: T12- L1: Small central disc protrusion without impingement L1-L2: Spondylosis.  No impingement L2-L3: Degenerative disc narrowing and bulging. Facet arthropathy with gaping joints and mild listhesis. There is a down turning disc protrusion. Dorsal epidural fat expansion with moderate to advanced spinal stenosis. Bilateral noncompressive foraminal narrowing. L3-L4: Discectomy with solid arthrodesis by CT. No residual impingement L4-L5: Posterior arthrodesis.  No residual impingement. L5-S1:Discectomy with solid arthrodesis.  No residual impingement. These results were called by telephone at the time of interpretation on 09/11/2017 at 8:59 pm to PEast [place] who verbally acknowledged these results. IMPRESSION: Thoracic spine: Previously noted paravertebral soft tissue is best attributed to phlegmon which is maximal around T7-8 where there is likely early discitis. Extending from T7-8 to C7 is a ventral epidural collection consistent with abscess. 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 Lumbar spine: 1. No acute finding. 2. L3-S1 solid arthrodesis. 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 Electronically Signed   By: JMonte FantasiaM.D.   On: 09/11/2017 21:04   Ct Renal Stone Study  Result Date: 09/11/2017 CLINICAL DATA:  Flank pain, back pain EXAM: CT ABDOMEN AND PELVIS WITHOUT CONTRAST TECHNIQUE: Multidetector CT imaging of the abdomen and pelvis was performed following the standard protocol without IV contrast. COMPARISON:  06/25/2016 FINDINGS: Lower chest: Small right pleural effusion. Scarring in the lingula. Right basilar O atelectasis. Heart is normal size. Abnormal soft tissue is noted in the posterior mediastinum and paravertebral regions in the  visualized lower chest, images 1 to 14. All all low all all Hepatobiliary: No focal hepatic abnormality. Gallbladder unremarkable. Pancreas: No focal abnormality or ductal dilatation. Spleen: No focal abnormality.  Normal size. Adrenals/Urinary Tract: No adrenal abnormality. No focal renal abnormality. No stones or hydronephrosis.  Urinary bladder is unremarkable. Stomach/Bowel: Stomach, large and small bowel grossly unremarkable. Normal appendix Vascular/Lymphatic: Aorta is normal caliber. There are mildly prominent upper abdominal retroperitoneal lymph nodes in the celiac axis region. Index celiac axis node has a short axis diameter of 10 mm on image 26. Reproductive: Prior hysterectomy. Cysts within the right ovary 4.1 x 3.1 cm. Left ovary unremarkable. Other: No free fluid or free air. Musculoskeletal: Postoperative changes from posterior fusion L3-S1. IMPRESSION: Abnormal posterior mediastinal/paravertebral soft tissue in the mid to lower chest. Associated small right pleural effusion. Mildly prominent upper abdominal retroperitoneal lymph nodes. This is of unknown etiology. Lymphoma is a possibility. Osteomyelitis or discitis is possible but felt less likely given the unremarkable appearance of the bony structures. I would recommend further evaluation with chest CT with IV contrast. Thoracic spine MRI may be helpful subsequently, but I want median with chest CT. Lingular scarring.  Right base atelectasis. These results were called by telephone at the time of interpretation on 09/11/2017 at 8:27 am to Dr. Duffy Bruce , who verbally acknowledged these results. Electronically Signed   By: Rolm Baptise M.D.   On: 09/11/2017 08:28        Scheduled Meds: . docusate sodium  100 mg Oral BID  . potassium chloride  40 mEq Oral Once   Continuous Infusions: . 0.9 % NaCl with KCl 20 mEq / L 125 mL/hr at 09/12/17 0821  .  ceFAZolin (ANCEF) IV       LOS: 1 day    Time spent: [place], MD [facility] Pager [phone]  If 7PM-7AM, please contact night-coverage [email or web address] Password [email or web address] 09/12/2017, 10:01 AM

## 2017-09-12 NOTE — Progress Notes (Signed)
  Echocardiogram 2D Echocardiogram has been performed.  Jennette Dubin 09/12/2017, 9:33 AM

## 2017-09-12 NOTE — Consult Note (Signed)
Chief Complaint   Chief Complaint  Patient presents with  . Neck Pain  . Back Pain    History of Present Illness  [name] is a 51 y.o. female admitted to [facility] with severe neck and back pain she says has been there for about 5 days. She says she has a difficult time turning/flexing her neck. Does not report N/T, does c/o generalized weakness. She was found to be septic with bacteremia, does have a hx of IVDU.  Past Medical History   Past Medical History:  Diagnosis Date  . ADD (attention deficit disorder with hyperactivity)   . Bilateral ovarian cysts   . Chlamydia   . Depression   . PID (acute pelvic inflammatory disease)   . PTSD (post-traumatic stress disorder)   . Sciatica     Past Surgical History   Past Surgical History:  Procedure Laterality Date  . ABDOMINAL HYSTERECTOMY    . BACK SURGERY     dbl lumbar fusion L5-S1  . BLADDER SURGERY    . LAPAROSCOPY    . mesh removel    . ROTATOR CUFF REPAIR Right 02/15/2013    Social History   Social History   Tobacco Use  . Smoking status: Current Every Day Smoker    Packs/day: 0.25    Years: 14.00    Pack years: 3.50    Types: Cigarettes  . Smokeless tobacco: Current User  Substance Use Topics  . Alcohol use: No  . Drug use: No    Comment: former    Medications   Prior to Admission medications   Medication Sig Start Date End Date Taking? Authorizing Provider  acetaminophen (TYLENOL) 500 MG tablet Take 500 mg by mouth every 6 (six) hours as needed for moderate pain.   Yes [provider]  ibuprofen (ADVIL,MOTRIN) 200 MG tablet Take 200 mg by mouth every 6 (six) hours as needed for moderate pain.   Yes [provider]  cephALEXin (KEFLEX) 500 MG capsule Take 1 capsule (500 mg total) 2 (two) times daily by mouth. Patient not taking: Reported on 09/11/2017 09/10/17   Antonietta Breach, PA-C  doxycycline (VIBRAMYCIN) 100 MG capsule Take 1 capsule (100 mg total) 2 (two) times daily by  mouth. Patient not taking: Reported on 09/11/2017 09/10/17   Antonietta Breach, PA-C  methocarbamol (ROBAXIN) 500 MG tablet Take 1 tablet (500 mg total) every 8 (eight) hours as needed by mouth for muscle spasms. Patient not taking: Reported on 09/11/2017 09/10/17   Antonietta Breach, PA-C  potassium chloride (K-DUR) 10 MEQ tablet Take 2 tablets (20 mEq total) 2 (two) times daily by mouth. Patient not taking: Reported on 09/11/2017 09/10/17   Antonietta Breach, PA-C    Allergies   Allergies  Allergen Reactions  . Imitrex [Sumatriptan Base] Shortness Of Breath  . Clarithromycin Nausea And Vomiting  . Clindamycin/Lincomycin Diarrhea    Violently sick-projectile vomiting  . Morphine And Related Nausea And Vomiting  . Vicodin [Hydrocodone-Acetaminophen] Nausea And Vomiting    Review of Systems  ROS  Neurologic Exam  Awake, alert, oriented Memory and concentration grossly intact Speech fluent, appropriate CN grossly intact Motor exam: Upper Extremities Deltoid Bicep Tricep Grip  Right 5/5 5/5 5/5 5/5  Left 5/5 5/5 5/5 5/5   Lower Extremities IP Quad PF DF EHL  Right 5/5 5/5 5/5 5/5 5/5  Left 5/5 5/5 5/5 5/5 5/5   Sensation grossly intact to LT  Imaging  MRI T-spine/L-spine reviewed, does demonstrate what appears to be a  ventral epidural fluid collection in the mid-thoracic spine extending to the lower cervical spine, in the region of the thoracic kyphosis. By my interpretation, the cord does not appear to be displaced. There may be small amount of hydromyelia in at T7-8.  Impression  - 51 y.o. female hx of IVDU with cervicothoracic SEA, appears to be neurologically intact. I think dorsal decompression over this region is unlikely to be of benefit given the thoracic kyphosis, and would have to extend over at least 8 levels. Thankfully she appears to be intact at this point.  Plan  - Would transfer to [facility] - Rec MRI C-spine w/w/o Gad also - Cont IV Abx, monitor culture results - Likely  repeat MRI in about 7-10 days to monitor abscess response to abx.

## 2017-09-12 NOTE — Progress Notes (Signed)
Pharmacy Antibiotic Note  ANTOINETTE BORGWARDT is a 51 y.o. female admitted on 09/11/2017 with fever, IVDU, back pain, fever, abnormal lymphadenopathy.  Pharmacy has been consulted for Vancomycin, Cefepime dosing then orders to narrow to Ancef per ID orders dosing due to MRSA bacteremia   Plan: 1) Ancef 2g IV q8. 2) Follow up renal fxn, culture results, and clinical course.   Height: 5\' 6"  (167.6 cm) Weight: 246 lb 0.5 oz (111.6 kg) IBW/kg (Calculated) : 59.3  Temp (24hrs), Avg:99.2 F (37.3 C), Min:97.7 F (36.5 C), Max:102 F (38.9 C)  Recent Labs  Lab 09/10/17 0245 09/11/17 0732 09/11/17 1343 09/12/17 0312  WBC 18.9* 17.1*  --  19.2*  CREATININE 0.88 0.80  --  0.66  LATICACIDVEN  --   --  1.3  --     Estimated Creatinine Clearance: 108.9 mL/min (by C-G formula based on SCr of 0.66 mg/dL).    Allergies  Allergen Reactions  . Imitrex [Sumatriptan Base] Shortness Of Breath  . Clarithromycin Nausea And Vomiting  . Clindamycin/Lincomycin Diarrhea    Violently sick-projectile vomiting  . Morphine And Related Nausea And Vomiting  . Vicodin [Hydrocodone-Acetaminophen] Nausea And Vomiting    Thank you for allowing pharmacy to be a part of this patient's care.  Adrian Saran, PharmD, BCPS Pager [phone] 09/12/2017 8:47 AM

## 2017-09-12 NOTE — Consult Note (Addendum)
Date of Admission:  09/11/2017          Reason for Consult: MSSA bacteremia    Referring Provider: Olin Pia "consult"   Assessment: 1. MSSA uremia with severe sepsis 2.  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 IVDU with syringes of yellow fluid (and also narcan) found on her person in treatment room 4. HCV +   Plan: 1. Narrow to cefazolin 2. She needs TEE 3. Neurosurgery--Dr. Kathyrn Sheriff has seen her and he is recommending transfer to [facility] for closer monitoring 4. Repeat blood cultures 5. DO NOT PLACE PICC OR CENTRAL LINE 6. Check HCV RNA quant, recheck HIV, HBV 7. Will ask Dr. Evette Doffing or Daryll Drown to see at [facility] for suboxone treatment  Principal Problem:   Severe sepsis [facility]) Active Problems:   Hypokalemia   Lymphadenopathy   Back pain   Hyponatremia   Right ovarian cyst   Hypotension   MSSA bacteremia   Abscess in epidural space of thoracic spine   Mediastinal mass   Scheduled Meds: . docusate sodium  100 mg Oral BID  . potassium chloride  40 mEq Oral Once   Continuous Infusions: . 0.9 % NaCl with KCl 20 mEq / L 125 mL/hr at 09/12/17 0821  .  ceFAZolin (ANCEF) IV     PRN Meds:.acetaminophen **OR** acetaminophen, dicyclomine, hydrOXYzine, ketorolac, loperamide, LORazepam, methocarbamol, morphine injection, naproxen, ondansetron (ZOFRAN) IV **OR** ondansetron **OR** ondansetron, oxyCODONE-acetaminophen  HPI: [name] is a 51 y.o. female with history of active IV drug use and hepatitis C seropositivity, initially presented to the emergency department November 15 with back pain spasms.  She was evaluated and was prescribed Keflex based on an abnormal UA though she had 0 symptoms consistent with a urinary tract infection other than the back pain.  She was discharged on Robaxin potassium Keflex and doxycycline.  Is not clear whether she took any of  these.  She came back to the emergency room on the 17th with worsening back pain and increased somnolence initially thought due to Ativan potentially.  She underwent a CT scan which raised the concern for some lymphadenopathy in the mediastinal area.  As well as soft tissue density near this area.  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 I have also independently reviewed MRI and the level of extension is dramatic indeed.   Blood cultures were drawn on admission and these have rapidly turned positive for methicillin sensitive Staphylococcus aureus.  I have narrowed her antibiotics to cefazolin repeated her blood cultures.  I have ordered a 2D echocardiogram which was being performed when I examined the patient earlier today.  Neurosurgery have been consulted and Dr. Kathyrn Sheriff has seen the patient.  He feels that at present surgery would be quite complicated and not of clear benefit with potential destabilization of the patient's spine.  He favors observation with continued IV antibiotics but in a more closely monitored neurology unit where also it would be easier to get her urgently into neurosurgery should her condition change.  The patient denies using recent IV drug use though nursing staff found her with 1 syringe with clear yellow fluid and and a needle attached with it along with a tourniquet that was wrapped in a towel that the  patient had been guarding. Security searched room and fuond 2 additional syringes. Narcan was found bu the syringe with yellow fluid looked suspicious for narcotic.   I spent greater than 80 minutes with the patient including greater than 50% of time in face to face counsel of the patient  Re her IVDU, her severe MSSA infection, bacteremia, epidural abscess, and in coordination of her care with Dr. Grandville Silos, Dr. Kathyrn Sheriff and Dr. Debara Pickett.  Dr [name]  will see the patient Orland Mustard when she transfers over to [facility].    Review of Systems: Review of Systems  Constitutional: Positive for fever and malaise/fatigue. Negative for chills and weight loss.  HENT: Negative for congestion and sore throat.   Eyes: Negative for blurred vision and photophobia.  Respiratory: Negative for cough, shortness of breath and wheezing.   Cardiovascular: Negative for chest pain, palpitations and leg swelling.  Gastrointestinal: Positive for nausea. Negative for abdominal pain, blood in stool, constipation, diarrhea, heartburn, melena and vomiting.  Genitourinary: Negative for dysuria, flank pain and hematuria.  Musculoskeletal: Positive for back pain, joint pain and myalgias. Negative for falls.  Skin: Negative for itching and rash.  Neurological: Positive for dizziness and weakness. Negative for sensory change, speech change, focal weakness, seizures and loss of consciousness.  Endo/Heme/Allergies: Does not bruise/bleed easily.  Psychiatric/Behavioral: Positive for substance abuse. Negative for depression, memory loss and suicidal ideas. The patient is nervous/anxious. The patient does not have insomnia.     Past Medical History:  Diagnosis Date  . ADD (attention deficit disorder with hyperactivity)   . Bilateral ovarian cysts   . Chlamydia   . Depression   . PID (acute pelvic inflammatory disease)   . PTSD (post-traumatic stress disorder)   . Sciatica     Social History   Tobacco Use  . Smoking status: Current Every Day Smoker    Packs/day: 0.25    Years: 14.00    Pack years: 3.50    Types: Cigarettes  . Smokeless tobacco: Current User  Substance Use Topics  . Alcohol use: No  . Drug use: No    Comment: former    Family History  Problem Relation Age of Onset  . Hypertension Mother   . Heart failure Mother   . Hypertension Father   . Heart failure Father   . Hypertension Sister   . Cancer Other    Allergies  Allergen Reactions    . Imitrex [Sumatriptan Base] Shortness Of Breath  . Clarithromycin Nausea And Vomiting  . Clindamycin/Lincomycin Diarrhea    Violently sick-projectile vomiting  . Morphine And Related Nausea And Vomiting  . Vicodin [Hydrocodone-Acetaminophen] Nausea And Vomiting    OBJECTIVE: Blood pressure 117/70, pulse 74, temperature 97.8 F (36.6 C), temperature source Axillary, resp. rate (!) 24, height 5\' 6"  (1.676 m), weight 246 lb 0.5 oz (111.6 kg), SpO2 99 %.  Physical Exam  Constitutional: She is oriented to person, place, and time and well-developed, well-nourished, and in no distress. No distress.  HENT:  Head: Normocephalic and atraumatic.  Right Ear: External ear normal.  Left Ear: External ear normal.  Nose: Nose normal.  Mouth/Throat: Oropharynx is clear and moist. No oropharyngeal exudate.  Eyes: Conjunctivae and EOM are normal. Pupils are equal, round, and reactive to light. No scleral icterus.  Neck: Normal range of motion. Neck supple.  Cardiovascular: Normal rate, regular rhythm and normal heart sounds. Exam reveals no gallop and no friction rub.  No murmur heard. Pulmonary/Chest: Effort normal and breath sounds normal. No  respiratory distress. She has no wheezes. She has no rales.  Abdominal: Soft. Bowel sounds are normal. She exhibits no distension. There is no tenderness. There is no rebound.  Musculoskeletal: Normal range of motion. She exhibits no edema or tenderness.  Lymphadenopathy:    She has no cervical adenopathy.  Neurological: She is oriented to person, place, and time. Coordination normal.  sleepy  Skin: Skin is warm. No rash noted. She is diaphoretic. No erythema. No pallor.  Psychiatric: Memory, affect and judgment normal. She exhibits a depressed mood.    Lab Results Lab Results  Component Value Date   WBC 19.2 (H) 09/12/2017   HGB 11.5 (L) 09/12/2017   HCT 33.8 (L) 09/12/2017   MCV 90.9 09/12/2017   PLT 292 09/12/2017    Lab Results  Component  Value Date   CREATININE 0.66 09/12/2017   BUN 7 09/12/2017   NA 137 09/12/2017   K 3.7 09/12/2017   CL 103 09/12/2017   CO2 26 09/12/2017    Lab Results  Component Value Date   ALT 20 09/12/2017   AST 31 09/12/2017   ALKPHOS 131 (H) 09/12/2017   BILITOT 1.4 (H) 09/12/2017     Microbiology: Recent Results (from the past 240 hour(s))  Urine culture     Status: Abnormal   Collection Time: 09/10/17 12:57 AM  Result Value Ref Range Status   Specimen Description URINE, CLEAN CATCH  Final   Special Requests NONE  Final   Culture MULTIPLE SPECIES PRESENT, SUGGEST RECOLLECTION (A)  Final   Report Status 09/11/2017 FINAL  Final  Wet prep, genital     Status: Abnormal   Collection Time: 09/10/17  4:20 AM  Result Value Ref Range Status   Yeast Wet Prep HPF POC NONE SEEN NONE SEEN Final   Trich, Wet Prep NONE SEEN NONE SEEN Final   Clue Cells Wet Prep HPF POC NONE SEEN NONE SEEN Final   WBC, Wet Prep HPF POC FEW (A) NONE SEEN Final   Sperm NONE SEEN  Final  Blood culture (routine x 2)     Status: None (Preliminary result)   Collection Time: 09/11/17  9:20 AM  Result Value Ref Range Status   Specimen Description BLOOD RIGHT ANTECUBITAL  Final   Special Requests   Final    BOTTLES DRAWN AEROBIC AND ANAEROBIC Blood Culture adequate volume   Culture  Setup Time   Final    GRAM POSITIVE COCCI IN CLUSTERS IN BOTH AEROBIC AND ANAEROBIC BOTTLES CRITICAL RESULT CALLED TO, READ BACK BY AND VERIFIED WITH: Novella Rob [id number] 09/12/2017 Mena Goes Performed at [facility], [address], [place], [place] [address]    Culture GRAM POSITIVE COCCI  Final   Report Status PENDING  Incomplete  Blood Culture ID Panel (Reflexed)     Status: Abnormal   Collection Time: 09/11/17  9:20 AM  Result Value Ref Range Status   Enterococcus species NOT DETECTED NOT DETECTED Final   Listeria monocytogenes NOT DETECTED NOT DETECTED Final   Staphylococcus species DETECTED (A) NOT DETECTED Final     Comment: CRITICAL RESULT CALLED TO, READ BACK BY AND VERIFIED WITH: B. GREEN,PHARMD [id number] 09/12/2017 T. TYSOR    Staphylococcus aureus DETECTED (A) NOT DETECTED Final    Comment: Methicillin (oxacillin) susceptible Staphylococcus aureus (MSSA). Preferred therapy is anti staphylococcal beta lactam antibiotic (Cefazolin or Nafcillin), unless clinically contraindicated. CRITICAL RESULT CALLED TO, READ BACK BY AND VERIFIED WITH: B. GREEN,PHARMD [id number] 09/12/2017 T. Rosalia Hammers  Methicillin resistance NOT DETECTED NOT DETECTED Final   Streptococcus species NOT DETECTED NOT DETECTED Final   Streptococcus agalactiae NOT DETECTED NOT DETECTED Final   Streptococcus pneumoniae NOT DETECTED NOT DETECTED Final   Streptococcus pyogenes NOT DETECTED NOT DETECTED Final   Acinetobacter baumannii NOT DETECTED NOT DETECTED Final   Enterobacteriaceae species NOT DETECTED NOT DETECTED Final   Enterobacter cloacae complex NOT DETECTED NOT DETECTED Final   Escherichia coli NOT DETECTED NOT DETECTED Final   Klebsiella oxytoca NOT DETECTED NOT DETECTED Final   Klebsiella pneumoniae NOT DETECTED NOT DETECTED Final   Proteus species NOT DETECTED NOT DETECTED Final   Serratia marcescens NOT DETECTED NOT DETECTED Final   Haemophilus influenzae NOT DETECTED NOT DETECTED Final   Neisseria meningitidis NOT DETECTED NOT DETECTED Final   Pseudomonas aeruginosa NOT DETECTED NOT DETECTED Final   Candida albicans NOT DETECTED NOT DETECTED Final   Candida glabrata NOT DETECTED NOT DETECTED Final   Candida krusei NOT DETECTED NOT DETECTED Final   Candida parapsilosis NOT DETECTED NOT DETECTED Final   Candida tropicalis NOT DETECTED NOT DETECTED Final    Comment: Performed at [facility], [place] [address] [place], [place] [address]  Blood culture (routine x 2)     Status: None (Preliminary result)   Collection Time: 09/11/17  9:33 AM  Result Value Ref Range Status   Specimen Description BLOOD BLOOD LEFT HAND  Final    Special Requests   Final    BOTTLES DRAWN AEROBIC AND ANAEROBIC Blood Culture adequate volume   Culture  Setup Time   Final    GRAM POSITIVE COCCI IN CLUSTERS IN BOTH AEROBIC AND ANAEROBIC BOTTLES CRITICAL VALUE NOTED.  VALUE IS CONSISTENT WITH PREVIOUSLY REPORTED AND CALLED VALUE. Performed at [facility], [place] [address], [place], [place] [address]    Culture GRAM POSITIVE COCCI  Final   Report Status PENDING  Incomplete  MRSA PCR Screening     Status: None   Collection Time: 09/11/17  1:03 PM  Result Value Ref Range Status   MRSA by PCR NEGATIVE NEGATIVE Final    Comment:        The GeneXpert MRSA Assay (FDA approved for NASAL specimens only), is one component of a comprehensive MRSA colonization surveillance program. It is not intended to diagnose MRSA infection nor to guide or monitor treatment for MRSA infections.   Culture, blood (Routine X 2) w Reflex to ID Panel     Status: None (Preliminary result)   Collection Time: 09/12/17  9:00 AM  Result Value Ref Range Status   Specimen Description BLOOD RIGHT HAND  Final   Special Requests   Final    IN PEDIATRIC BOTTLE Blood Culture adequate volume Performed at [facility] [address], [place], [place] [address]    Culture PENDING  Incomplete   Report Status PENDING  Incomplete  Culture, blood (Routine X 2) w Reflex to ID Panel     Status: None (Preliminary result)   Collection Time: 09/12/17  9:15 AM  Result Value Ref Range Status   Specimen Description BLOOD BLOOD RIGHT HAND  Final   Special Requests   Final    IN PEDIATRIC BOTTLE Blood Culture adequate volume Performed at [facility] [address], [place],  [address]    Culture PENDING  Incomplete   Report Status PENDING  Incomplete    Alcide Evener, [place] [facility] [phone]  pager   [phone] cell 09/12/2017, 12:50 PM

## 2017-09-12 NOTE — Progress Notes (Signed)
PHARMACY - PHYSICIAN COMMUNICATION CRITICAL VALUE ALERT - BLOOD CULTURE IDENTIFICATION (BCID)  Results for orders placed or performed during the hospital encounter of 09/11/17  Blood Culture ID Panel (Reflexed) (Collected: 09/11/2017  9:20 AM)  Result Value Ref Range   Enterococcus species NOT DETECTED NOT DETECTED   Listeria monocytogenes NOT DETECTED NOT DETECTED   Staphylococcus species DETECTED (A) NOT DETECTED   Staphylococcus aureus DETECTED (A) NOT DETECTED   Methicillin resistance NOT DETECTED NOT DETECTED   Streptococcus species NOT DETECTED NOT DETECTED   Streptococcus agalactiae NOT DETECTED NOT DETECTED   Streptococcus pneumoniae NOT DETECTED NOT DETECTED   Streptococcus pyogenes NOT DETECTED NOT DETECTED   Acinetobacter baumannii NOT DETECTED NOT DETECTED   Enterobacteriaceae species NOT DETECTED NOT DETECTED   Enterobacter cloacae complex NOT DETECTED NOT DETECTED   Escherichia coli NOT DETECTED NOT DETECTED   Klebsiella oxytoca NOT DETECTED NOT DETECTED   Klebsiella pneumoniae NOT DETECTED NOT DETECTED   Proteus species NOT DETECTED NOT DETECTED   Serratia marcescens NOT DETECTED NOT DETECTED   Haemophilus influenzae NOT DETECTED NOT DETECTED   Neisseria meningitidis NOT DETECTED NOT DETECTED   Pseudomonas aeruginosa NOT DETECTED NOT DETECTED   Candida albicans NOT DETECTED NOT DETECTED   Candida glabrata NOT DETECTED NOT DETECTED   Candida krusei NOT DETECTED NOT DETECTED   Candida parapsilosis NOT DETECTED NOT DETECTED   Candida tropicalis NOT DETECTED NOT DETECTED    Name of physician (or Provider) Contacted: Jeannette Corpus NP  Changes to prescribed antibiotics required: on vanc/cefepime.  Consider narrowing to ancef when clinically appropriate.   Dorrene German 09/12/2017  3:58 AM

## 2017-09-12 NOTE — Progress Notes (Signed)
1900: Handoff report received from dayshift nurse. Pt appears to be in mild distress. COWS = 5. Discussed pain management plan and goals for the night. Pt amenable.  2200: Spoke with sister, Beverlee Nims, with pt consent, about plan of care. She expressed concern over pt well-being after d/c. Will call back in the morning to check on pt after TEE.  0000: While getting up to the bedside commode, pt reports feeling better and states that mobilizing is easier. She is optimistic that therapy is effective.  0400: Pt resting comfortably.  0700: Handoff report given to RN. No acute events overnight.

## 2017-09-13 DIAGNOSIS — M25462 Effusion, left knee: Secondary | ICD-10-CM

## 2017-09-13 DIAGNOSIS — R768 Other specified abnormal immunological findings in serum: Secondary | ICD-10-CM

## 2017-09-13 DIAGNOSIS — M4643 Discitis, unspecified, cervicothoracic region: Secondary | ICD-10-CM

## 2017-09-13 LAB — COMPREHENSIVE METABOLIC PANEL
ALBUMIN: 2.1 g/dL — AB (ref 3.5–5.0)
ALK PHOS: 373 U/L — AB (ref 38–126)
ALT: 19 U/L (ref 14–54)
AST: 41 U/L (ref 15–41)
Anion gap: 6 (ref 5–15)
BUN: <5 mg/dL — ABNORMAL LOW (ref 6–20)
CALCIUM: 7.7 mg/dL — AB (ref 8.9–10.3)
CHLORIDE: 104 mmol/L (ref 101–111)
CO2: 27 mmol/L (ref 22–32)
CREATININE: 0.67 mg/dL (ref 0.44–1.00)
GFR calc non Af Amer: >60 mL/min (ref >60)
GLUCOSE: 89 mg/dL (ref 65–99)
Potassium: 3.6 mmol/L (ref 3.5–5.1)
SODIUM: 137 mmol/L (ref 135–145)
Total Bilirubin: 2 mg/dL — ABNORMAL HIGH (ref 0.3–1.2)
Total Protein: 5.6 g/dL — ABNORMAL LOW (ref 6.5–8.1)

## 2017-09-13 LAB — CBC WITH DIFFERENTIAL/PLATELET
BASOS ABS: 0 10*3/uL (ref 0.0–0.1)
Basophils Relative: 0 %
EOS ABS: 0.4 10*3/uL (ref 0.0–0.7)
Eosinophils Relative: 2 %
HCT: 38.1 % (ref 36.0–46.0)
HEMOGLOBIN: 13.1 g/dL (ref 12.0–15.0)
LYMPHS PCT: 9 %
Lymphs Abs: 1.7 10*3/uL (ref 0.7–4.0)
MCH: 31 pg (ref 26.0–34.0)
MCHC: 34.4 g/dL (ref 30.0–36.0)
MCV: 90.1 fL (ref 78.0–100.0)
MONO ABS: 1.7 10*3/uL — AB (ref 0.1–1.0)
Monocytes Relative: 9 %
NEUTROS PCT: 80 %
Neutro Abs: 15.3 10*3/uL — ABNORMAL HIGH (ref 1.7–7.7)
PLATELETS: 312 10*3/uL (ref 150–400)
RBC: 4.23 MIL/uL (ref 3.87–5.11)
RDW: 14.2 % (ref 11.5–15.5)
WBC: 19.1 10*3/uL — AB (ref 4.0–10.5)

## 2017-09-13 LAB — URINE CULTURE: Culture: NO GROWTH

## 2017-09-13 LAB — CEA: CEA: 1.4 ng/mL (ref 0.0–4.7)

## 2017-09-13 LAB — HIV ANTIBODY (ROUTINE TESTING W REFLEX): HIV Screen 4th Generation wRfx: NONREACTIVE

## 2017-09-13 LAB — BETA 2 MICROGLOBULIN, SERUM: Beta-2 Microglobulin: 2.8 mg/L — ABNORMAL HIGH (ref 0.6–2.4)

## 2017-09-13 LAB — PHOSPHORUS: Phosphorus: 3.7 mg/dL (ref 2.5–4.6)

## 2017-09-13 LAB — MAGNESIUM: MAGNESIUM: 2 mg/dL (ref 1.7–2.4)

## 2017-09-13 LAB — PROCALCITONIN: PROCALCITONIN: 4.61 ng/mL

## 2017-09-13 LAB — PROTIME-INR
INR: 1.25
Prothrombin Time: 15.6 seconds — ABNORMAL HIGH (ref 11.4–15.2)

## 2017-09-13 LAB — SEDIMENTATION RATE: SED RATE: 47 mm/h — AB (ref 0–22)

## 2017-09-13 LAB — C-REACTIVE PROTEIN: CRP: 24.8 mg/dL — AB (ref <1.0)

## 2017-09-13 MED ORDER — HYDRALAZINE HCL 20 MG/ML IJ SOLN
5.0000 mg | INTRAMUSCULAR | Status: DC | PRN
  Administered 2017-09-16: 5 mg via INTRAVENOUS
  Filled 2017-09-13: qty 1

## 2017-09-13 MED ORDER — OXYCODONE HCL 5 MG PO TABS
5.0000 mg | ORAL_TABLET | ORAL | Status: DC | PRN
  Administered 2017-09-13 – 2017-09-19 (×24): 10 mg via ORAL
  Administered 2017-09-19: 5 mg via ORAL
  Filled 2017-09-13 (×27): qty 2

## 2017-09-13 MED ORDER — NAFCILLIN SODIUM 2 G IJ SOLR
2.0000 g | INTRAVENOUS | Status: DC
  Administered 2017-09-13 – 2017-09-15 (×14): 2 g via INTRAVENOUS
  Filled 2017-09-13 (×20): qty 2000

## 2017-09-13 NOTE — Progress Notes (Signed)
No issues overnight. Pt cont to c/o neck pain.  EXAM:  BP (!) 133/92 (BP Location: Left Arm)   Pulse 84   Temp 97.7 F (36.5 C) (Oral)   Resp (!) 23   Ht 5\' 6"  (1.676 m)   Wt 117.4 kg (258 lb 13.1 oz)   SpO2 98%   BMI 41.77 kg/m   Awake, alert, oriented  Speech fluent, appropriate  CN grossly intact  Good strength BLE   IMPRESSION:  51 y.o. female with large ventral cervico-thoracic SEA but no clinical signs of myelopathy  PLAN: - Cont abx - TEE today - MRI c-spine w/w/o

## 2017-09-13 NOTE — Progress Notes (Signed)
Offered Pt oral care Pt declined at this time.

## 2017-09-13 NOTE — Progress Notes (Signed)
PHARMACY - PHYSICIAN COMMUNICATION CRITICAL VALUE ALERT - BLOOD CULTURE IDENTIFICATION (BCID)  Microbiology call re: positive blood cultures from 11/18. Previous cultures/BCID on 11/17 positive for MSSA and patient being followed by ID. Cultures from 11/18 also with GPCs in clusters, so no new BCID run.   Patient is currently on Ancef 2g IV q8hr.   Name of physician (or Provider) Contacted: Dr. Maudie Mercury  Changes to prescribed antibiotics required: No changes at this time, continue to follow cultures.   Lavonda Jumbo, PharmD Clinical Pharmacist 09/13/17 3:36 AM

## 2017-09-13 NOTE — Progress Notes (Signed)
[facility]    Date of Admission:  09/11/2017   Total days of antibiotics 3        Day 3 cefazolin   ID: [name] is a 51 y.o. 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 Has chills no fever.   She mentioned that drugs found in her purse are her roommates which she confiscates from her in order to minimize her roommate's risk for OD. She had previously used heroin but stopped after she overdosed in April. Drug screen doesn't show opiates but has benzo/amph noted  ROS: left knee swelling and pain with ambulation  Medications:  . docusate sodium  100 mg Oral BID  . potassium chloride  40 mEq Oral Once    Objective: Vital signs in last 24 hours: Temp:  [97.7 F (36.5 C)-99.5 F (37.5 C)] 98.1 F (36.7 C) (11/19 1214) Pulse Rate:  [78-95] 80 (11/19 1214) Resp:  [15-32] 15 (11/19 1214) BP: (101-145)/(57-123) 104/68 (11/19 1214) SpO2:  [76 %-99 %] 98 % (11/19 1214) Weight:  [255 lb 4.7 oz (115.8 kg)-258 lb 13.1 oz (117.4 kg)] 258 lb 13.1 oz (117.4 kg) (11/19 0730) Physical Exam  Constitutional:  oriented to person, place, and time. appears well-developed and well-nourished. No distress.  HENT: /AT, PERRLA, no scleral icterus Mouth/Throat: Oropharynx is clear and moist. No oropharyngeal exudate.  Cardiovascular: Normal rate, regular rhythm and normal heart sounds. Exam reveals no gallop and no friction rub.  No murmur heard.  Pulmonary/Chest: Effort normal and breath sounds normal. No respiratory distress.  has no wheezes.  Neck = supple, no nuchal rigidity. Limited ROM due to pain Abdominal: Soft. Bowel  sounds are normal.  exhibits no distension. There is no tenderness.  Lymphadenopathy: no cervical adenopathy. No axillary adenopathy Neurological: alert and oriented to person, place, and time.  Skin: Skin is warm and dry. No rash noted. No erythema.  Psychiatric: a normal mood and affect.  behavior is normal.    Lab Results Recent Labs    09/12/17 0312 09/13/17 0347  WBC 19.2* 19.1*  HGB 11.5* 13.1  HCT 33.8* 38.1  NA 137 137  K 3.7 3.6  CL 103 104  CO2 26 27  BUN 7 <5*  CREATININE 0.66 0.67   Liver Panel Recent Labs    09/12/17 0312 09/13/17 0347  PROT 5.3* 5.6*  ALBUMIN 2.2* 2.1*  AST 31 41  ALT 20 19  ALKPHOS 131* 373*  BILITOT 1.4* 2.0*   Sedimentation Rate Recent Labs    09/13/17 0347  ESRSEDRATE 47*   C-Reactive Protein Recent Labs    09/11/17 0930 09/13/17 0347  CRP 21.7* 24.8*    Microbiology: 11/18 blood cx + 11/17 blood cx MSSA Studies/Results: Mr Thoracic Spine W Wo Contrast  Result Date: 09/11/2017 CLINICAL DATA:  Acute thoracic back pain. Elevated white count, CRP, ESR. EXAM: MRI THORACIC AND LUMBAR SPINE WITHOUT AND WITH CONTRAST TECHNIQUE: Multiplanar and multiecho pulse sequences of the thoracic and lumbar spine were obtained without and with intravenous contrast. CONTRAST:  72m MULTIHANCE GADOBENATE DIMEGLUMINE 529 MG/ML IV SOLN COMPARISON:  Chest  CT from earlier today. FINDINGS: MRI THORACIC SPINE FINDINGS Alignment:  Normal Vertebrae: There is subtle anterior corner T2 hyperintensity at T7-8 and T8-9. The anterior T7-8 disc is subtly T2 hyperintense. Ill-defined enhancing soft tissue intensity from T6-T9, maximal at the T7-8 level where there is hypoenhancing pockets about the disc space which may reflect collections. Also at T7-8 disc is ventral epidural thickening that extends cranially with fluid collection in the ventral epidural space reaching C7. This ventral epidural collection measures up to 5-6 mm AP. This collection posteriorly  displaces the spinal cord, which shows hydromyelia from T5-T7 to a mild degree (1-2 mm). No cord edema. This degree of paravertebral inflammation with limited disc involvement raises the possibility of the tuberculous or other atypical infection, but the abscess and clinical symptomatology favors pyogenic infection. Paravertebral thrombophlebitis is considered in addition to primary discitis. Cord:  As above Paraspinal and other soft tissues: Phlegmon as above Disc levels: No notable degenerative changes. MRI LUMBAR SPINE FINDINGS Segmentation:  Standard. Alignment:  Mild retrolisthesis at L2-3. Vertebrae: Status post L3-4 and L5-S1 discectomy with solid arthrodesis by CT. There is L3-S1 posterior arthrodesis. No evidence of discitis or osteomyelitis. No fracture or aggressive bone lesion. Conus medullaris: Extends to the L1 level and appears normal. Paraspinal and other soft tissues: Postoperative fatty atrophy of lower intrinsic back muscles. Small volume chronic appearing fluid collections in the laminectomy defects of L3-4 and L4-5. No associated mass effect. Disc levels: T12- L1: Small central disc protrusion without impingement L1-L2: Spondylosis.  No impingement L2-L3: Degenerative disc narrowing and bulging. Facet arthropathy with gaping joints and mild listhesis. There is a down turning disc protrusion. Dorsal epidural fat expansion with moderate to advanced spinal stenosis. Bilateral noncompressive foraminal narrowing. L3-L4: Discectomy with solid arthrodesis by CT. No residual impingement L4-L5: Posterior arthrodesis.  No residual impingement. L5-S1:Discectomy with solid arthrodesis.  No residual impingement. These results were called by telephone at the time of interpretation on 09/11/2017 at 8:59 pm to [name], who verbally acknowledged these results. IMPRESSION: Thoracic spine: Previously noted paravertebral soft tissue is best attributed to phlegmon which is maximal around T7-8 where there is likely  early discitis. Extending from T7-8 to C7 is a ventral epidural collection consistent with abscess. 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 Lumbar spine: 1. No acute finding. 2. L3-S1 solid arthrodesis. 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 Electronically Signed   By: Monte Fantasia M.D.   On: 09/11/2017 21:04   Mr Lumbar Spine W Wo Contrast  Result Date: 09/11/2017 CLINICAL DATA:  Acute thoracic back pain. Elevated white count, CRP, ESR. EXAM: MRI THORACIC AND LUMBAR SPINE WITHOUT AND WITH CONTRAST TECHNIQUE: Multiplanar and multiecho pulse sequences of the thoracic and lumbar spine were obtained without and with intravenous contrast. CONTRAST:  35m MULTIHANCE GADOBENATE DIMEGLUMINE 529 MG/ML IV SOLN COMPARISON:  Chest CT from earlier today. FINDINGS: MRI THORACIC SPINE FINDINGS Alignment:  Normal Vertebrae: There is subtle anterior corner T2 hyperintensity at T7-8 and T8-9. The anterior T7-8 disc is subtly T2 hyperintense. Ill-defined enhancing soft tissue intensity from T6-T9, maximal at the T7-8 level where there is hypoenhancing pockets about the disc space which may reflect collections. Also at T7-8 disc is ventral epidural thickening that extends cranially with fluid collection in the ventral epidural space reaching C7. This ventral epidural collection measures up to 5-6 mm AP. This collection posteriorly displaces the spinal cord, which shows hydromyelia from T5-T7  to a mild degree (1-2 mm). No cord edema. This degree of paravertebral inflammation with limited disc involvement raises the possibility of the tuberculous or other atypical infection, but the abscess and clinical symptomatology favors pyogenic infection. Paravertebral thrombophlebitis is considered in addition to primary discitis. Cord:  As above Paraspinal and other soft tissues: Phlegmon as above Disc levels: No notable degenerative changes. MRI  LUMBAR SPINE FINDINGS Segmentation:  Standard. Alignment:  Mild retrolisthesis at L2-3. Vertebrae: Status post L3-4 and L5-S1 discectomy with solid arthrodesis by CT. There is L3-S1 posterior arthrodesis. No evidence of discitis or osteomyelitis. No fracture or aggressive bone lesion. Conus medullaris: Extends to the L1 level and appears normal. Paraspinal and other soft tissues: Postoperative fatty atrophy of lower intrinsic back muscles. Small volume chronic appearing fluid collections in the laminectomy defects of L3-4 and L4-5. No associated mass effect. Disc levels: T12- L1: Small central disc protrusion without impingement L1-L2: Spondylosis.  No impingement L2-L3: Degenerative disc narrowing and bulging. Facet arthropathy with gaping joints and mild listhesis. There is a down turning disc protrusion. Dorsal epidural fat expansion with moderate to advanced spinal stenosis. Bilateral noncompressive foraminal narrowing. L3-L4: Discectomy with solid arthrodesis by CT. No residual impingement L4-L5: Posterior arthrodesis.  No residual impingement. L5-S1:Discectomy with solid arthrodesis.  No residual impingement. These results were called by telephone at the time of interpretation on 09/11/2017 at 8:59 pm to [place], who verbally acknowledged these results. IMPRESSION: Thoracic spine: Previously noted paravertebral soft tissue is best attributed to phlegmon which is maximal around T7-8 where there is likely early discitis. Extending from T7-8 to C7 is a ventral epidural collection consistent with abscess. 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 Lumbar spine: 1. No acute finding. 2. L3-S1 solid arthrodesis. 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 Electronically Signed   By: Monte Fantasia M.D.   On: 09/11/2017 21:04     Assessment/Plan: 51 y.o. 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

## 2017-09-13 NOTE — Progress Notes (Signed)
TEAM 1 - Stepdown/ICU TEAM  [name]  OEV:[id number] DOB: Mar 17, 1966 DOA: 09/11/2017 PCP: Marliss Coots, NP    Brief Narrative:  51 year old female w/ a history of IV drug use and chronic back pain status post previous back surgery who presented to the ED with severe back and neck pain.  An MRI of the T and L-spine revealed a ventral epidural abscess.  Patient was also noted to have MSSA bacteremia.    Patient was placed empirically on IV antibiotics.  ID and neurosurgery consulted.  Patient was subsequently transferred to [facility].  Significant Events: 11/17 admit to [facility]  11/18 transfer to [facility]  Subjective: Somewhat groggy/drowsy.  C/o uncontrolled pain and reports she needs more pain med, but is close to dozing off as we speak.    Assessment & Plan:  Severe sepsis secondary to MSSA bacteremia, ventral epidural abscess extending from T7-8 to C7 ID and Neurosurgery directing care of this issue - to have TEE Wednesday  Hypotension responded to IV fluids  Lymphadenopathy/mediastinal and paravertebral soft tissue masses A consequence of her severe infection   Polysubstance abuse/IV drug use Possessed suspected drugs and definite drug paraphernalia at time of admission - no clear evidence of withdrawal at this time   DVT prophylaxis: SCDs Code Status: FULL CODE Family Communication: no family present at time of exam  Disposition Plan: SDU  Consultants:  Heme/Onc ID NS  Antimicrobials:  Nafcillin 11/19 > Cefazolin 11/18 > 11/19 cefepime 09/11/2017 > 09/12/2017 vancomycin 11/17  Objective: Blood pressure 118/84, pulse 77, temperature 97.9 F (36.6 C), resp. rate 14, height 5\' 6"  (1.676 m), weight 117.4 kg (258 lb 13.1 oz), SpO2 100 %.  Intake/Output Summary (Last 24 hours) at 09/13/2017 1737 Last data filed at 09/13/2017 1416 Gross per 24 hour  Intake 4291.25 ml  Output -  Net 4291.25 ml   Filed Weights   09/11/17 1228 09/12/17  1319 09/13/17 0730  Weight: 111.6 kg (246 lb 0.5 oz) 115.8 kg (255 lb 4.7 oz) 117.4 kg (258 lb 13.1 oz)    Examination: General: No acute respiratory distress Lungs: Clear to auscultation bilaterally without wheezes or crackles Cardiovascular: Regular rate and rhythm without murmur gallop or rub normal S1 and S2 Abdomen: Nontender, nondistended, soft, bowel sounds positive, no rebound, no ascites, no appreciable mass Extremities: No significant cyanosis, clubbing, or edema bilateral lower extremities  CBC: Recent Labs  Lab 09/10/17 0245 09/11/17 0732 09/12/17 0312 09/12/17 0910 09/13/17 0347  WBC 18.9* 17.1* 19.2*  --  19.1*  NEUTROABS 16.0* 14.4*  --  15.8* 15.3*  HGB 12.2 12.1 11.5*  --  13.1  HCT 34.3* 34.2* 33.8*  --  38.1  MCV 88.9 89.3 90.9  --  90.1  PLT 334 383 292  --  829   Basic Metabolic Panel: Recent Labs  Lab 09/10/17 0245 09/10/17 0404 09/11/17 0732 09/12/17 0312 09/12/17 0910 09/13/17 0347  NA 132*  --  131* 137  --  137  K 2.6*  --  2.9* 3.7  --  3.6  CL 98*  --  94* 103  --  104  CO2 24  --  30 26  --  27  GLUCOSE 111*  --  109* 88  --  89  BUN 9  --  9 7  --  <5*  CREATININE 0.88  --  0.80 0.66  --  0.67  CALCIUM 8.0*  --  8.3* 7.5*  --  7.7*  MG  --  1.9 1.8  --  2.0 2.0  PHOS  --   --   --   --   --  3.7   GFR: Estimated Creatinine Clearance: 112 mL/min (by C-G formula based on SCr of 0.67 mg/dL).  Liver Function Tests: Recent Labs  Lab 09/10/17 0245 09/12/17 0312 09/13/17 0347  AST 32 31 41  ALT 28 20 19   ALKPHOS 84 131* 373*  BILITOT 1.1 1.4* 2.0*  PROT 6.6 5.3* 5.6*  ALBUMIN 3.0* 2.2* 2.1*    Coagulation Profile: Recent Labs  Lab 09/13/17 0347  INR 1.25    Cardiac Enzymes: Recent Labs  Lab 09/11/17 0732  CKTOTAL 198    HbA1C: Hgb A1c MFr Bld  Date/Time Value Ref Range Status  04/11/2017 06:20 PM 5.9 (H) 4.8 - 5.6 % Final    Comment:    (NOTE)         Pre-diabetes: 5.7 - 6.4         Diabetes: >6.4          Glycemic control for adults with diabetes: <7.0      Recent Results (from the past 240 hour(s))  Urine culture     Status: Abnormal   Collection Time: 09/10/17 12:57 AM  Result Value Ref Range Status   Specimen Description URINE, CLEAN CATCH  Final   Special Requests NONE  Final   Culture MULTIPLE SPECIES PRESENT, SUGGEST RECOLLECTION (A)  Final   Report Status 09/11/2017 FINAL  Final  Wet prep, genital     Status: Abnormal   Collection Time: 09/10/17  4:20 AM  Result Value Ref Range Status   Yeast Wet Prep HPF POC NONE SEEN NONE SEEN Final   Trich, Wet Prep NONE SEEN NONE SEEN Final   Clue Cells Wet Prep HPF POC NONE SEEN NONE SEEN Final   WBC, Wet Prep HPF POC FEW (A) NONE SEEN Final   Sperm NONE SEEN  Final  Blood culture (routine x 2)     Status: Abnormal (Preliminary result)   Collection Time: 09/11/17  9:20 AM  Result Value Ref Range Status   Specimen Description BLOOD RIGHT ANTECUBITAL  Final   Special Requests   Final    BOTTLES DRAWN AEROBIC AND ANAEROBIC Blood Culture adequate volume   Culture  Setup Time   Final    GRAM POSITIVE COCCI IN CLUSTERS IN BOTH AEROBIC AND ANAEROBIC BOTTLES CRITICAL RESULT CALLED TO, READ BACK BY AND VERIFIED WITH: BShirlee Latch [id number] 09/12/2017 T. TYSOR    Culture (A)  Final    STAPHYLOCOCCUS AUREUS SUSCEPTIBILITIES TO FOLLOW Performed at [facility], [address] [place], [place] [address]    Report Status PENDING  Incomplete  Blood Culture ID Panel (Reflexed)     Status: Abnormal   Collection Time: 09/11/17  9:20 AM  Result Value Ref Range Status   Enterococcus species NOT DETECTED NOT DETECTED Final   Listeria monocytogenes NOT DETECTED NOT DETECTED Final   Staphylococcus species DETECTED (A) NOT DETECTED Final    Comment: CRITICAL RESULT CALLED TO, READ BACK BY AND VERIFIED WITH: B. GREEN,PHARMD [id number] 09/12/2017 T. TYSOR    Staphylococcus aureus DETECTED (A) NOT DETECTED Final    Comment: Methicillin (oxacillin)  susceptible Staphylococcus aureus (MSSA). Preferred therapy is anti staphylococcal beta lactam antibiotic (Cefazolin or Nafcillin), unless clinically contraindicated. CRITICAL RESULT CALLED TO, READ BACK BY AND VERIFIED WITH: B. GREEN,PHARMD [id number] 09/12/2017 T. TYSOR    Methicillin resistance NOT DETECTED NOT DETECTED Final   Streptococcus  species NOT DETECTED NOT DETECTED Final   Streptococcus agalactiae NOT DETECTED NOT DETECTED Final   Streptococcus pneumoniae NOT DETECTED NOT DETECTED Final   Streptococcus pyogenes NOT DETECTED NOT DETECTED Final   Acinetobacter baumannii NOT DETECTED NOT DETECTED Final   Enterobacteriaceae species NOT DETECTED NOT DETECTED Final   Enterobacter cloacae complex NOT DETECTED NOT DETECTED Final   Escherichia coli NOT DETECTED NOT DETECTED Final   Klebsiella oxytoca NOT DETECTED NOT DETECTED Final   Klebsiella pneumoniae NOT DETECTED NOT DETECTED Final   Proteus species NOT DETECTED NOT DETECTED Final   Serratia marcescens NOT DETECTED NOT DETECTED Final   Haemophilus influenzae NOT DETECTED NOT DETECTED Final   Neisseria meningitidis NOT DETECTED NOT DETECTED Final   Pseudomonas aeruginosa NOT DETECTED NOT DETECTED Final   Candida albicans NOT DETECTED NOT DETECTED Final   Candida glabrata NOT DETECTED NOT DETECTED Final   Candida krusei NOT DETECTED NOT DETECTED Final   Candida parapsilosis NOT DETECTED NOT DETECTED Final   Candida tropicalis NOT DETECTED NOT DETECTED Final    Comment: Performed at [facility], [place] [address] [place], [place] [address]  Blood culture (routine x 2)     Status: Abnormal (Preliminary result)   Collection Time: 09/11/17  9:33 AM  Result Value Ref Range Status   Specimen Description BLOOD BLOOD LEFT HAND  Final   Special Requests   Final    BOTTLES DRAWN AEROBIC AND ANAEROBIC Blood Culture adequate volume   Culture  Setup Time   Final    GRAM POSITIVE COCCI IN CLUSTERS IN BOTH AEROBIC AND ANAEROBIC  BOTTLES CRITICAL VALUE NOTED.  VALUE IS CONSISTENT WITH PREVIOUSLY REPORTED AND CALLED VALUE. Performed at [facility], [place] [address] [place], [place] [address]    Culture STAPHYLOCOCCUS AUREUS (A)  Final   Report Status PENDING  Incomplete  MRSA PCR Screening     Status: None   Collection Time: 09/11/17  1:03 PM  Result Value Ref Range Status   MRSA by PCR NEGATIVE NEGATIVE Final    Comment:        The GeneXpert MRSA Assay (FDA approved for NASAL specimens only), is one component of a comprehensive MRSA colonization surveillance program. It is not intended to diagnose MRSA infection nor to guide or monitor treatment for MRSA infections.   Culture, blood (Routine X 2) w Reflex to ID Panel     Status: None (Preliminary result)   Collection Time: 09/12/17  9:00 AM  Result Value Ref Range Status   Specimen Description BLOOD RIGHT HAND  Final   Special Requests   Final    IN PEDIATRIC BOTTLE Blood Culture adequate volume Performed at [place] [address], [place], [place] [address]    Culture  Setup Time   Final    GRAM POSITIVE COCCI IN CLUSTERS IN PEDIATRIC BOTTLE CRITICAL RESULT CALLED TO, READ BACK BY AND VERIFIED WITH: Rosanne Ashing [id number] 09/13/2017 T. TYSOR    Culture GRAM POSITIVE COCCI  Final   Report Status PENDING  Incomplete  Culture, blood (Routine X 2) w Reflex to ID Panel     Status: None (Preliminary result)   Collection Time: 09/12/17  9:15 AM  Result Value Ref Range Status   Specimen Description BLOOD BLOOD RIGHT HAND  Final   Special Requests   Final    IN PEDIATRIC BOTTLE Blood Culture adequate volume Performed at [facility], [place] [address], [place],  [address]  Culture  Setup Time   Final    GRAM POSITIVE COCCI IN CLUSTERS CRITICAL RESULT CALLED TO, READ BACK BY AND VERIFIED WITH: Rosanne Ashing [id number] 09/13/2017 T. TYSOR    Culture GRAM POSITIVE COCCI  Final   Report Status PENDING  Incomplete  Culture, Urine      Status: None   Collection Time: 09/12/17 11:45 AM  Result Value Ref Range Status   Specimen Description URINE, CLEAN CATCH  Final   Special Requests NONE  Final   Culture   Final    NO GROWTH Performed at [facility], [address] [place], [place] [address]    Report Status 09/13/2017 FINAL  Final     Scheduled Meds: . docusate sodium  100 mg Oral BID  . potassium chloride  40 mEq Oral Once     LOS: 2 days   Cherene Altes, MD [facility] Office  [phone] Pager - Text Page per Shea Evans as per below:  On-Call/Text Page:      [email or web address]      password [email or web address]  If 7PM-7AM, please contact night-coverage [email or web address] Password [facility] 09/13/2017, 5:37 PM

## 2017-09-13 NOTE — Progress Notes (Signed)
Tech offered Pt a bath. Pt stated she is in pain and would prefer to wait. Tech will check back with Pt regarding bath later.

## 2017-09-13 NOTE — Progress Notes (Signed)
CSW went to speak with pt at bedside. Pt asked that CSW come back at a later time as pt is unable to speak very clearly at this time. CSW was informed by pt that pt is suppose to have a procedure completed today and would rather talk with CSW after this. Pt expressed being dehydrated also saying that this is a reason that pt cant talk very well at this time. CSW will try an complete assessment at another time.     Virgie Dad Winston Misner, MSW, [facility] Clinical Social Worker [phone]

## 2017-09-14 ENCOUNTER — Inpatient Hospital Stay (HOSPITAL_COMMUNITY): Payer: Self-pay

## 2017-09-14 LAB — COMPREHENSIVE METABOLIC PANEL
ALT: 16 U/L (ref 14–54)
AST: 36 U/L (ref 15–41)
Albumin: 1.9 g/dL — ABNORMAL LOW (ref 3.5–5.0)
Alkaline Phosphatase: 513 U/L — ABNORMAL HIGH (ref 38–126)
Anion gap: 7 (ref 5–15)
BUN: 6 mg/dL (ref 6–20)
CHLORIDE: 102 mmol/L (ref 101–111)
CO2: 28 mmol/L (ref 22–32)
Calcium: 7.6 mg/dL — ABNORMAL LOW (ref 8.9–10.3)
Creatinine, Ser: 0.71 mg/dL (ref 0.44–1.00)
Glucose, Bld: 93 mg/dL (ref 65–99)
POTASSIUM: 3.3 mmol/L — AB (ref 3.5–5.1)
SODIUM: 137 mmol/L (ref 135–145)
Total Bilirubin: 3.5 mg/dL — ABNORMAL HIGH (ref 0.3–1.2)
Total Protein: 5.2 g/dL — ABNORMAL LOW (ref 6.5–8.1)

## 2017-09-14 LAB — CBC
HEMATOCRIT: 37.4 % (ref 36.0–46.0)
Hemoglobin: 12.2 g/dL (ref 12.0–15.0)
MCH: 29.6 pg (ref 26.0–34.0)
MCHC: 32.6 g/dL (ref 30.0–36.0)
MCV: 90.8 fL (ref 78.0–100.0)
Platelets: 307 10*3/uL (ref 150–400)
RBC: 4.12 MIL/uL (ref 3.87–5.11)
RDW: 14.5 % (ref 11.5–15.5)
WBC: 19 10*3/uL — AB (ref 4.0–10.5)

## 2017-09-14 LAB — CULTURE, BLOOD (ROUTINE X 2)
SPECIAL REQUESTS: ADEQUATE
SPECIAL REQUESTS: ADEQUATE

## 2017-09-14 LAB — HEPATITIS A ANTIBODY, TOTAL: HEP A TOTAL AB: NEGATIVE

## 2017-09-14 LAB — PHOSPHORUS: Phosphorus: 5 mg/dL — ABNORMAL HIGH (ref 2.5–4.6)

## 2017-09-14 LAB — HEPATITIS B SURFACE ANTIBODY, QUANTITATIVE: HEPATITIS B-POST: 22.4 m[IU]/mL

## 2017-09-14 LAB — MAGNESIUM: Magnesium: 1.9 mg/dL (ref 1.7–2.4)

## 2017-09-14 LAB — HEPATITIS B SURFACE ANTIGEN: Hepatitis B Surface Ag: NEGATIVE

## 2017-09-14 MED ORDER — LORAZEPAM 2 MG/ML IJ SOLN
2.0000 mg | Freq: Once | INTRAMUSCULAR | Status: AC
  Administered 2017-09-14: 2 mg via INTRAVENOUS
  Filled 2017-09-14: qty 1

## 2017-09-14 MED ORDER — POTASSIUM CHLORIDE CRYS ER 20 MEQ PO TBCR
40.0000 meq | EXTENDED_RELEASE_TABLET | Freq: Once | ORAL | Status: DC
  Filled 2017-09-14: qty 2

## 2017-09-14 MED ORDER — GADOBENATE DIMEGLUMINE 529 MG/ML IV SOLN
20.0000 mL | Freq: Once | INTRAVENOUS | Status: AC | PRN
  Administered 2017-09-14: 20 mL via INTRAVENOUS

## 2017-09-14 NOTE — Progress Notes (Signed)
[place] for Infectious Disease    Date of Admission:  09/11/2017   Total days of antibiotics 4        Day 2 nafcillin   ID: [name] is a 51 y.o. female with MSSA bacteremia and C2-T7 ventral epidural abscess Principal Problem:   Severe sepsis (HCC) Active Problems:   Hypokalemia   Lymphadenopathy   Back pain   Hyponatremia   Right ovarian cyst   Hypotension   MSSA bacteremia   Abscess in epidural space of thoracic spine   Mediastinal mass    Subjective: Having neck pain, but noted to have drowsiness and some desaturation overnight. Patient remains afebrile  24hr event = blood cx still positive  Medications:  . docusate sodium  100 mg Oral BID    Objective: Vital signs in last 24 hours: Temp:  [97.9 F (36.6 C)-98.4 F (36.9 C)] 98.4 F (36.9 C) (11/20 0831) Pulse Rate:  [76-102] 95 (11/20 1200) Resp:  [12-22] 17 (11/20 1200) BP: (101-118)/(59-84) 109/73 (11/20 1200) SpO2:  [87 %-100 %] 96 % (11/20 1200) Weight:  [255 lb 4.7 oz (115.8 kg)] 255 lb 4.7 oz (115.8 kg) (11/20 0831)  Went to [facility]  Lab Results Recent Labs    09/13/17 0347 09/14/17 0401  WBC 19.1* 19.0*  HGB 13.1 12.2  HCT 38.1 37.4  NA 137 137  K 3.6 3.3*  CL 104 102  CO2 27 28  BUN <5* 6  CREATININE 0.67 0.71   Liver Panel Recent Labs    09/13/17 0347 09/14/17 0401  PROT 5.6* 5.2*  ALBUMIN 2.1* 1.9*  AST 41 36  ALT 19 16  ALKPHOS 373* 513*  BILITOT 2.0* 3.5*   Sedimentation Rate Recent Labs    09/13/17 0347  ESRSEDRATE 47*   C-Reactive Protein Recent Labs    09/13/17 0347  CRP 24.8*    Microbiology: 11/19 blood cx gpc 11/18 blood cx MSSA 11/17 blood cx MSSA Studies/Results: Mr Cervical Spine W Wo Contrast  Result Date: 09/14/2017 CLINICAL DATA:  History of IV drug abuse presenting with severe back pain status post recent back surgery. MRI of the thoracic and lumbar spine showed epidural abscess extending into the cervical spine. EXAM: MRI  CERVICAL SPINE WITHOUT AND WITH CONTRAST TECHNIQUE: Multiplanar and multiecho pulse sequences of the cervical spine, to include the craniocervical junction and cervicothoracic junction, were obtained without and with intravenous contrast. CONTRAST:  13mL MULTIHANCE GADOBENATE DIMEGLUMINE 529 MG/ML IV SOLN COMPARISON:  None. FINDINGS: Despite efforts by the technologist and patient, motion artifact is present on today's exam and could not be eliminated. This reduces exam sensitivity and specificity. Alignment: Reversal of the normal cervical lordosis centered at C4-C5. Vertebrae: Large right facet joint effusion at C2-C3 with surrounding periarticular soft tissue inflammatory changes. Small right facet joint effusion at C3-C4. No fracture, evidence of discitis, or bone lesion. Cord: Again seen is a ventral epidural fluid collection extending from C7 into the thoracic spine below the field of view. At the level of C7-T1, this fluid collection appears to wrap around into the posterior epidural space, where it continues superiorly to the level of C2, measuring up to 5 mm in AP dimension. The epidural fluid collection also involves the right lateral epidural space at C2. The spinal cord is displaced anteriorly from C2 through C6, with severe narrowing of thecal sac. No spinal cord edema. Posterior Fossa, vertebral arteries, paraspinal tissues: Nonspecific suboccipital and posterior upper cervical paraspinal soft tissue edema. Otherwise negative.  Disc levels: C2-C3: Bilateral facet arthropathy. No spinal canal or neuroforaminal stenosis. C3-C4: Right facet uncovertebral hypertrophy resulting in mild right neuroforaminal stenosis. C4-C5: Posterior disc osteophyte complex and bilateral uncovertebral hypertrophy resulting in mild central spinal canal stenosis and mild bilateral neuroforaminal stenosis. C5-C6: Posterior disc osteophyte complex and bilateral facet uncovertebral hypertrophy resulting in moderate left and mild  right neuroforaminal stenosis. C6-C7: Left paracentral and foraminal disc protrusion resulting in mild narrowing of left aspect of the spinal canal and severe left neuroforaminal stenosis. Moderate right neuroforaminal stenosis due to uncovertebral hypertrophy. C7-T1:  Negative. IMPRESSION: 1. Epidural abscess extending from C2 into the thoracic spine. At the C7 level, the ventral epidural abscess seen on prior thoracic spine MRI wraps around into the posterior epidural space, continuing superiorly to the level of C2. There is anterior displacement and slight deformity of the cervical cord without definite evidence of cord edema, although evaluation is extremely limited by motion artifact. 2. Large right facet joint effusion at C2-C3 with surrounding periarticular soft tissue inflammatory changes, concerning for septic arthritis. Small facet joint effusion on the right at C3-C4 may also reflect septic arthritis. 3. Suboccipital and posterior upper cervical paraspinal soft tissue inflammatory changes. 4. Severe left neuroforaminal stenosis at C6-C7 due to paracentral/left foraminal disc protrusion and uncovertebral hypertrophy. Electronically Signed   By: Titus Dubin M.D.   On: 09/14/2017 13:38     Assessment/Plan: Disseminated MSSA infection with bacteremia and cervicothoracic ventral epidural abscess = latest imaging shows evidence of posterior epidural involvement at C7. C2-C3-C4 changes concerning for septic arthritis and anterior displacement of cervical cord. I am concerned that Her blood cx remain positive, likely due to heavy burden of disease. From ID standpoint, she would benefit from debridement/evacuation of epidural abscess though ventral SEA with signs of posterior SEA. She is too sleepy to get full neuro exam to see if she has signs of myelopathy. Defer to neurosurgery to whether she would benefit for surgery  Continue on nafcillin. And await when to see TEE findings  [name]  [facility]ious [facility] Cell: [phone] Pager: [phone]  09/14/2017, 2:26 PM

## 2017-09-14 NOTE — Progress Notes (Signed)
1900: Report received from RN. Goals of care for the shift addressed with pt. Landlord visiting, asking for keys to temporary residence.  2000: MRI called for pt. [employer] states that there is no one available after hours to remove electronic ankle monitor. Advised to cut bracelet if necessary for pt care, but did not confirm or deny pt liability. Provider-on-call okay with delaying MRI until morning so that [employer] can come to manage the ankle monitor. Pt amenable to plan. MRI updated.  0000: Multiple episodes of desaturation while sleeping. O2 sats to mid 80s on 3L with humidifier, HOB@30  degrees. Pt states that she is awoken by pain. The waking is objectively consistent with pulse ox alarms.  0400: Pt continues sleeping well tonight with occasional drops in O2 saturation  0700: Handoff report given to RN.

## 2017-09-14 NOTE — Progress Notes (Signed)
CSW went to speak with pt at bedside. Pt was gone to MRI per RN. CSW will continue to assist in getting assessment done.       Virgie Dad Kalvyn Desa, MSW, [place] [facility] Clinical Social Worker [phone]

## 2017-09-14 NOTE — Progress Notes (Signed)
[facility] TEAM 1 - Stepdown/ICU TEAM  [name]  IOE:[id number] DOB: 10-Oct-1966 DOA: 09/11/2017 PCP: Marliss Coots, NP    Brief Narrative:  51 year old female w/ a history of IV drug use and chronic back pain status post previous back surgery who presented to the ED with severe back and neck pain.  An MRI of the T and L-spine revealed a ventral epidural abscess.  Patient was also noted to have MSSA bacteremia.    Patient was placed empirically on IV antibiotics.  ID and neurosurgery consulted.  Patient was subsequently transferred to [facility].  Significant Events: 11/17 admit to [facility]  11/18 transfer to [facility]  Subjective: Patient is sedate but easily awakens to my voice.  She is able to spontaneously move all 4 extremities displaying 4+ out of 5 strength in both her arms and legs.  She complains of pain in her neck and headache.  She denies chest pain or shortness of breath.  She denies abdominal pain.  Assessment & Plan:  Severe sepsis secondary to MSSA bacteremia - complex ventral epidural abscess extending from C2 to T8 ID and Neurosurgery directing care of this issue - to have TEE Wednesday - ID now concerned that surgical intervention indicated - NS following - MRI C spine was able to be accomplished today under sedation and did not reveal evidence of cord edema  Hypotension Resolved with volume resuscitation and treatment of sepsis  Lymphadenopathy/mediastinal and paravertebral soft tissue masses A consequence of her severe infection   Polysubstance abuse/IV drug use Possessed suspected drugs and definite drug paraphernalia at time of admission - no clear evidence of withdrawal at this time   DVT prophylaxis: SCDs Code Status: FULL CODE Family Communication: no family present at time of exam  Disposition Plan: SDU  Consultants:  Heme/Onc ID NS  Antimicrobials:  Nafcillin 11/19 > Cefazolin 11/18 > 11/19 Cefepime 09/11/2017 >  09/12/2017 Vancomycin 11/17  Objective: Blood pressure 109/73, pulse 95, temperature 98.4 F (36.9 C), temperature source Oral, resp. rate 17, height 5\' 6"  (1.676 m), weight 115.8 kg (255 lb 4.7 oz), SpO2 96 %.  Intake/Output Summary (Last 24 hours) at 09/14/2017 1629 Last data filed at 09/14/2017 1500 Gross per 24 hour  Intake 1915 ml  Output 253 ml  Net 1662 ml   Filed Weights   09/12/17 1319 09/13/17 0730 09/14/17 0831  Weight: 115.8 kg (255 lb 4.7 oz) 117.4 kg (258 lb 13.1 oz) 115.8 kg (255 lb 4.7 oz)    Examination: General: No acute respiratory distress - sedate from ativan for MRI Lungs: CTA B - no wheezing  Cardiovascular: RRR - no M or rub  Abdomen: NT/ND, soft, overweight, no rebound, no mass  Extremities: No signif edema bilateral lower extremities  CBC: Recent Labs  Lab 09/10/17 0245 09/11/17 0732 09/12/17 0312 09/12/17 0910 09/13/17 0347 09/14/17 0401  WBC 18.9* 17.1* 19.2*  --  19.1* 19.0*  NEUTROABS 16.0* 14.4*  --  15.8* 15.3*  --   HGB 12.2 12.1 11.5*  --  13.1 12.2  HCT 34.3* 34.2* 33.8*  --  38.1 37.4  MCV 88.9 89.3 90.9  --  90.1 90.8  PLT 334 383 292  --  312 182   Basic Metabolic Panel: Recent Labs  Lab 09/10/17 0245 09/10/17 0404 09/11/17 0732 09/12/17 0312 09/12/17 0910 09/13/17 0347 09/14/17 0401  NA 132*  --  131* 137  --  137 137  K 2.6*  --  2.9* 3.7  --  3.6 3.3*  CL 98*  --  94* 103  --  104 102  CO2 24  --  30 26  --  27 28  GLUCOSE 111*  --  109* 88  --  89 93  BUN 9  --  9 7  --  <5* 6  CREATININE 0.88  --  0.80 0.66  --  0.67 0.71  CALCIUM 8.0*  --  8.3* 7.5*  --  7.7* 7.6*  MG  --  1.9 1.8  --  2.0 2.0 1.9  PHOS  --   --   --   --   --  3.7 5.0*   GFR: Estimated Creatinine Clearance: 111.2 mL/min (by C-G formula based on SCr of 0.71 mg/dL).  Liver Function Tests: Recent Labs  Lab 09/10/17 0245 09/12/17 0312 09/13/17 0347 09/14/17 0401  AST 32 31 41 36  ALT 28 20 19 16   ALKPHOS 84 131* 373* 513*  BILITOT 1.1  1.4* 2.0* 3.5*  PROT 6.6 5.3* 5.6* 5.2*  ALBUMIN 3.0* 2.2* 2.1* 1.9*    Coagulation Profile: Recent Labs  Lab 09/13/17 0347  INR 1.25    Cardiac Enzymes: Recent Labs  Lab 09/11/17 0732  CKTOTAL 198    HbA1C: Hgb A1c MFr Bld  Date/Time Value Ref Range Status  04/11/2017 06:20 PM 5.9 (H) 4.8 - 5.6 % Final    Comment:    (NOTE)         Pre-diabetes: 5.7 - 6.4         Diabetes: >6.4         Glycemic control for adults with diabetes: <7.0      Recent Results (from the past 240 hour(s))  Urine culture     Status: Abnormal   Collection Time: 09/10/17 12:57 AM  Result Value Ref Range Status   Specimen Description URINE, CLEAN CATCH  Final   Special Requests NONE  Final   Culture MULTIPLE SPECIES PRESENT, SUGGEST RECOLLECTION (A)  Final   Report Status 09/11/2017 FINAL  Final  Wet prep, genital     Status: Abnormal   Collection Time: 09/10/17  4:20 AM  Result Value Ref Range Status   Yeast Wet Prep HPF POC NONE SEEN NONE SEEN Final   Trich, Wet Prep NONE SEEN NONE SEEN Final   Clue Cells Wet Prep HPF POC NONE SEEN NONE SEEN Final   WBC, Wet Prep HPF POC FEW (A) NONE SEEN Final   Sperm NONE SEEN  Final  Blood culture (routine x 2)     Status: Abnormal   Collection Time: 09/11/17  9:20 AM  Result Value Ref Range Status   Specimen Description BLOOD RIGHT ANTECUBITAL  Final   Special Requests   Final    BOTTLES DRAWN AEROBIC AND ANAEROBIC Blood Culture adequate volume   Culture  Setup Time   Final    GRAM POSITIVE COCCI IN CLUSTERS IN BOTH AEROBIC AND ANAEROBIC BOTTLES CRITICAL RESULT CALLED TO, READ BACK BY AND VERIFIED WITH: Novella Rob [id number] 09/12/2017 Mena Goes Performed at [facility], [address] [place], [place] [address]    Culture STAPHYLOCOCCUS AUREUS (A)  Final   Report Status 09/14/2017 FINAL  Final   Organism ID, Bacteria STAPHYLOCOCCUS AUREUS  Final      Susceptibility   Staphylococcus aureus - MIC*    CIPROFLOXACIN <=0.5 SENSITIVE  Sensitive     ERYTHROMYCIN >=8 RESISTANT Resistant     GENTAMICIN <=0.5 SENSITIVE Sensitive     OXACILLIN  0.5 SENSITIVE Sensitive     TETRACYCLINE <=1 SENSITIVE Sensitive     VANCOMYCIN <=0.5 SENSITIVE Sensitive     TRIMETH/SULFA <=10 SENSITIVE Sensitive     CLINDAMYCIN <=0.25 SENSITIVE Sensitive     RIFAMPIN <=0.5 SENSITIVE Sensitive     Inducible Clindamycin NEGATIVE Sensitive     * STAPHYLOCOCCUS AUREUS  Blood Culture ID Panel (Reflexed)     Status: Abnormal   Collection Time: 09/11/17  9:20 AM  Result Value Ref Range Status   Enterococcus species NOT DETECTED NOT DETECTED Final   Listeria monocytogenes NOT DETECTED NOT DETECTED Final   Staphylococcus species DETECTED (A) NOT DETECTED Final    Comment: CRITICAL RESULT CALLED TO, READ BACK BY AND VERIFIED WITH: B. GREEN,PHARMD [id number] 09/12/2017 T. TYSOR    Staphylococcus aureus DETECTED (A) NOT DETECTED Final    Comment: Methicillin (oxacillin) susceptible Staphylococcus aureus (MSSA). Preferred therapy is anti staphylococcal beta lactam antibiotic (Cefazolin or Nafcillin), unless clinically contraindicated. CRITICAL RESULT CALLED TO, READ BACK BY AND VERIFIED WITH: B. GREEN,PHARMD 1443 09/12/2017 T. TYSOR    Methicillin resistance NOT DETECTED NOT DETECTED Final   Streptococcus species NOT DETECTED NOT DETECTED Final   Streptococcus agalactiae NOT DETECTED NOT DETECTED Final   Streptococcus pneumoniae NOT DETECTED NOT DETECTED Final   Streptococcus pyogenes NOT DETECTED NOT DETECTED Final   Acinetobacter baumannii NOT DETECTED NOT DETECTED Final   Enterobacteriaceae species NOT DETECTED NOT DETECTED Final   Enterobacter cloacae complex NOT DETECTED NOT DETECTED Final   Escherichia coli NOT DETECTED NOT DETECTED Final   Klebsiella oxytoca NOT DETECTED NOT DETECTED Final   Klebsiella pneumoniae NOT DETECTED NOT DETECTED Final   Proteus species NOT DETECTED NOT DETECTED Final   Serratia marcescens NOT DETECTED NOT DETECTED Final    Haemophilus influenzae NOT DETECTED NOT DETECTED Final   Neisseria meningitidis NOT DETECTED NOT DETECTED Final   Pseudomonas aeruginosa NOT DETECTED NOT DETECTED Final   Candida albicans NOT DETECTED NOT DETECTED Final   Candida glabrata NOT DETECTED NOT DETECTED Final   Candida krusei NOT DETECTED NOT DETECTED Final   Candida parapsilosis NOT DETECTED NOT DETECTED Final   Candida tropicalis NOT DETECTED NOT DETECTED Final    Comment: Performed at [facility], [address] [place], [place] [address]  Blood culture (routine x 2)     Status: Abnormal   Collection Time: 09/11/17  9:33 AM  Result Value Ref Range Status   Specimen Description BLOOD BLOOD LEFT HAND  Final   Special Requests   Final    BOTTLES DRAWN AEROBIC AND ANAEROBIC Blood Culture adequate volume   Culture  Setup Time   Final    GRAM POSITIVE COCCI IN CLUSTERS IN BOTH AEROBIC AND ANAEROBIC BOTTLES CRITICAL VALUE NOTED.  VALUE IS CONSISTENT WITH PREVIOUSLY REPORTED AND CALLED VALUE.    Culture (A)  Final    STAPHYLOCOCCUS AUREUS SUSCEPTIBILITIES PERFORMED ON PREVIOUS CULTURE WITHIN THE LAST 5 DAYS. Performed at [facility], [place] [address] [place], [place] [address]    Report Status 09/14/2017 FINAL  Final  MRSA PCR Screening     Status: None   Collection Time: 09/11/17  1:03 PM  Result Value Ref Range Status   MRSA by PCR NEGATIVE NEGATIVE Final    Comment:        The GeneXpert MRSA Assay (FDA approved for NASAL specimens only), is one component of a comprehensive MRSA colonization surveillance program. It is not intended to diagnose MRSA infection nor to guide or  monitor treatment for MRSA infections.   Culture, blood (Routine X 2) w Reflex to ID Panel     Status: Abnormal (Preliminary result)   Collection Time: 09/12/17  9:00 AM  Result Value Ref Range Status   Specimen Description BLOOD RIGHT HAND  Final   Special Requests IN PEDIATRIC BOTTLE Blood Culture adequate volume  Final    Culture  Setup Time   Final    GRAM POSITIVE COCCI IN CLUSTERS IN PEDIATRIC BOTTLE CRITICAL RESULT CALLED TO, READ BACK BY AND VERIFIED WITH: K. Caroline Sauger 1308 09/13/2017 T. TYSOR    Culture (A)  Final    STAPHYLOCOCCUS AUREUS SUSCEPTIBILITIES PERFORMED ON PREVIOUS CULTURE WITHIN THE LAST 5 DAYS. Performed at [facility], [place] [address], [place], [place] [address]    Report Status PENDING  Incomplete  Culture, blood (Routine X 2) w Reflex to ID Panel     Status: Abnormal (Preliminary result)   Collection Time: 09/12/17  9:15 AM  Result Value Ref Range Status   Specimen Description BLOOD BLOOD RIGHT HAND  Final   Special Requests IN PEDIATRIC BOTTLE Blood Culture adequate volume  Final   Culture  Setup Time   Final    GRAM POSITIVE COCCI IN CLUSTERS CRITICAL RESULT CALLED TO, READ BACK BY AND VERIFIED WITH: K. Caroline Sauger [id number] 09/13/2017 T. TYSOR    Culture (A)  Final    STAPHYLOCOCCUS AUREUS SUSCEPTIBILITIES PERFORMED ON PREVIOUS CULTURE WITHIN THE LAST 5 DAYS. Performed at [facility], [place] [address], [place], [place] [address]    Report Status PENDING  Incomplete  Culture, Urine     Status: None   Collection Time: 09/12/17 11:45 AM  Result Value Ref Range Status   Specimen Description URINE, CLEAN CATCH  Final   Special Requests NONE  Final   Culture   Final    NO GROWTH Performed at [facility], [place] [address], [place], [place] [address]    Report Status 09/13/2017 FINAL  Final  Culture, blood (routine x 2)     Status: None (Preliminary result)   Collection Time: 09/13/17  9:02 AM  Result Value Ref Range Status   Specimen Description BLOOD LEFT HAND  Final   Special Requests IN PEDIATRIC BOTTLE Blood Culture adequate volume  Final   Culture  Setup Time   Final    GRAM POSITIVE COCCI IN CLUSTERS IN PEDIATRIC BOTTLE IDENTIFICATION TO FOLLOW    Culture GRAM POSITIVE COCCI  Final   Report Status PENDING  Incomplete  Culture, blood (routine x  2)     Status: None (Preliminary result)   Collection Time: 09/13/17  9:07 AM  Result Value Ref Range Status   Specimen Description BLOOD RIGHT HAND  Final   Special Requests IN PEDIATRIC BOTTLE Blood Culture adequate volume  Final   Culture  Setup Time   Final    GRAM POSITIVE COCCI IN CLUSTERS IN PEDIATRIC BOTTLE CRITICAL RESULT CALLED TO, READ BACK BY AND VERIFIED WITHAlona Bene PHARMD 0102 09/14/17 A BROWNING    Culture GRAM POSITIVE COCCI  Final   Report Status PENDING  Incomplete     Scheduled Meds: . docusate sodium  100 mg Oral BID     LOS: 3 days   Cherene Altes, MD [facility] Office  [phone] Pager - Text Page per Amion as per below:  On-Call/Text Page:      [email or web address]      password [email or web address]  If 7PM-7AM, please contact night-coverage  [email or web address] Password [facility] 09/14/2017, 4:29 PM

## 2017-09-14 NOTE — Progress Notes (Signed)
    [facility] has been requested to perform a transesophageal echocardiogram on [name] for sepsis/bacteremia.  After careful review of history and examination, the risks and benefits of transesophageal echocardiogram have been explained including risks of esophageal damage, perforation (1:10,000 risk), bleeding, pharyngeal hematoma as well as other potential complications associated with monitored anesthesia care including aspiration, arrhythmia, respiratory failure and death. Alternatives to treatment were discussed, questions were answered. Patient is willing to proceed.   Cecilie Kicks, NP  09/14/2017 3:11 PM

## 2017-09-14 NOTE — Progress Notes (Addendum)
MRI called for patient procedure. Called [employer] spolke with [employer] [place] about the need for electronics to come for patient to get an MRI. He stated he will find an Garment/textile technologist and send them over.

## 2017-09-15 ENCOUNTER — Encounter (HOSPITAL_COMMUNITY): Payer: Self-pay | Admitting: Certified Registered Nurse Anesthetist

## 2017-09-15 ENCOUNTER — Inpatient Hospital Stay (HOSPITAL_COMMUNITY): Payer: Self-pay | Admitting: Anesthesiology

## 2017-09-15 ENCOUNTER — Other Ambulatory Visit (HOSPITAL_COMMUNITY): Payer: Self-pay

## 2017-09-15 ENCOUNTER — Inpatient Hospital Stay (HOSPITAL_COMMUNITY): Payer: Self-pay

## 2017-09-15 ENCOUNTER — Encounter (HOSPITAL_COMMUNITY)
Admission: EM | Disposition: A | Discharge status: Home / Self Care | Payer: Self-pay | Source: Home / Self Care | Attending: Internal Medicine

## 2017-09-15 ENCOUNTER — Encounter (HOSPITAL_COMMUNITY): Payer: Self-pay | Admitting: Internal Medicine

## 2017-09-15 ENCOUNTER — Ambulatory Visit (HOSPITAL_COMMUNITY): Admit: 2017-09-15 | Payer: No Typology Code available for payment source | Admitting: Internal Medicine

## 2017-09-15 DIAGNOSIS — E66813 Obesity, class 3: Secondary | ICD-10-CM

## 2017-09-15 DIAGNOSIS — R339 Retention of urine, unspecified: Secondary | ICD-10-CM

## 2017-09-15 DIAGNOSIS — D72828 Other elevated white blood cell count: Secondary | ICD-10-CM

## 2017-09-15 DIAGNOSIS — D72829 Elevated white blood cell count, unspecified: Secondary | ICD-10-CM

## 2017-09-15 DIAGNOSIS — G061 Intraspinal abscess and granuloma: Secondary | ICD-10-CM

## 2017-09-15 DIAGNOSIS — M542 Cervicalgia: Secondary | ICD-10-CM

## 2017-09-15 HISTORY — PX: POSTERIOR CERVICAL FUSION/FORAMINOTOMY: SHX5038

## 2017-09-15 HISTORY — DX: Obesity, class 3: E66.813

## 2017-09-15 HISTORY — DX: Morbid (severe) obesity due to excess calories: E66.01

## 2017-09-15 LAB — CULTURE, BLOOD (ROUTINE X 2)
SPECIAL REQUESTS: ADEQUATE
Special Requests: ADEQUATE

## 2017-09-15 LAB — HCV RNA QUANT RFLX ULTRA OR GENOTYP
HCV RNA QNT(LOG COPY/ML): 6.086 {Log_IU}/mL
HEPATITIS C QUANTITATION: 1220000 [IU]/mL

## 2017-09-15 LAB — BASIC METABOLIC PANEL
ANION GAP: 9 (ref 5–15)
BUN: 7 mg/dL (ref 6–20)
CHLORIDE: 101 mmol/L (ref 101–111)
CO2: 28 mmol/L (ref 22–32)
Calcium: 7.8 mg/dL — ABNORMAL LOW (ref 8.9–10.3)
Creatinine, Ser: 0.76 mg/dL (ref 0.44–1.00)
GFR calc Af Amer: >60 mL/min (ref >60)
GFR calc non Af Amer: >60 mL/min (ref >60)
GLUCOSE: 77 mg/dL (ref 65–99)
POTASSIUM: 3.9 mmol/L (ref 3.5–5.1)
Sodium: 138 mmol/L (ref 135–145)

## 2017-09-15 LAB — CBC
HEMATOCRIT: 35.3 % — AB (ref 36.0–46.0)
HEMOGLOBIN: 11.7 g/dL — AB (ref 12.0–15.0)
MCH: 30.4 pg (ref 26.0–34.0)
MCHC: 33.1 g/dL (ref 30.0–36.0)
MCV: 91.7 fL (ref 78.0–100.0)
Platelets: 362 10*3/uL (ref 150–400)
RBC: 3.85 MIL/uL — ABNORMAL LOW (ref 3.87–5.11)
RDW: 15.2 % (ref 11.5–15.5)
WBC: 21.1 10*3/uL — ABNORMAL HIGH (ref 4.0–10.5)

## 2017-09-15 LAB — TYPE AND SCREEN
ABO/RH(D): A POS
ANTIBODY SCREEN: NEGATIVE

## 2017-09-15 LAB — PROTIME-INR
INR: 1.19
Prothrombin Time: 15 seconds (ref 11.4–15.2)

## 2017-09-15 LAB — MRSA PCR SCREENING: MRSA by PCR: NEGATIVE

## 2017-09-15 LAB — ALT: ALT: 15 U/L (ref 14–54)

## 2017-09-15 LAB — HEPATITIS C GENOTYPE

## 2017-09-15 LAB — BILIRUBIN, DIRECT: BILIRUBIN DIRECT: 3.1 mg/dL — AB (ref 0.1–0.5)

## 2017-09-15 SURGERY — POSTERIOR CERVICAL FUSION/FORAMINOTOMY LEVEL 4
Anesthesia: General | Site: Spine Cervical

## 2017-09-15 SURGERY — ECHOCARDIOGRAM, TRANSESOPHAGEAL
Anesthesia: Monitor Anesthesia Care

## 2017-09-15 MED ORDER — LIDOCAINE 2% (20 MG/ML) 5 ML SYRINGE
INTRAMUSCULAR | Status: AC
  Filled 2017-09-15: qty 10

## 2017-09-15 MED ORDER — SUGAMMADEX SODIUM 200 MG/2ML IV SOLN
INTRAVENOUS | Status: AC
  Filled 2017-09-15: qty 2

## 2017-09-15 MED ORDER — ONDANSETRON HCL 4 MG/2ML IJ SOLN
INTRAMUSCULAR | Status: DC | PRN
  Administered 2017-09-15: 4 mg via INTRAVENOUS

## 2017-09-15 MED ORDER — SUGAMMADEX SODIUM 200 MG/2ML IV SOLN
INTRAVENOUS | Status: DC | PRN
  Administered 2017-09-15: 300 mg via INTRAVENOUS

## 2017-09-15 MED ORDER — PHENYLEPHRINE HCL 10 MG/ML IJ SOLN
INTRAMUSCULAR | Status: DC | PRN
  Administered 2017-09-15 (×2): 80 ug via INTRAVENOUS
  Administered 2017-09-15: 160 ug via INTRAVENOUS
  Administered 2017-09-15: 80 ug via INTRAVENOUS

## 2017-09-15 MED ORDER — PROPOFOL 10 MG/ML IV BOLUS
INTRAVENOUS | Status: AC
  Filled 2017-09-15: qty 40

## 2017-09-15 MED ORDER — LACTATED RINGERS IV SOLN
INTRAVENOUS | Status: DC
  Administered 2017-09-15 (×2): via INTRAVENOUS

## 2017-09-15 MED ORDER — ESMOLOL HCL 100 MG/10ML IV SOLN
INTRAVENOUS | Status: AC
  Filled 2017-09-15: qty 10

## 2017-09-15 MED ORDER — THROMBIN (RECOMBINANT) 20000 UNITS EX SOLR
CUTANEOUS | Status: DC | PRN
  Administered 2017-09-15: 13:00:00 via TOPICAL

## 2017-09-15 MED ORDER — SODIUM CHLORIDE 0.9 % IR SOLN
Status: DC | PRN
  Administered 2017-09-15: 13:00:00

## 2017-09-15 MED ORDER — HYDROMORPHONE HCL 1 MG/ML IJ SOLN: 0.2500 mg | INTRAMUSCULAR | Status: DC | PRN

## 2017-09-15 MED ORDER — THROMBIN (RECOMBINANT) 20000 UNITS EX SOLR
CUTANEOUS | Status: AC
  Filled 2017-09-15: qty 20000

## 2017-09-15 MED ORDER — ONDANSETRON HCL 4 MG/2ML IJ SOLN
INTRAMUSCULAR | Status: AC
  Filled 2017-09-15: qty 2

## 2017-09-15 MED ORDER — SUGAMMADEX SODIUM 500 MG/5ML IV SOLN
INTRAVENOUS | Status: AC
  Filled 2017-09-15: qty 5

## 2017-09-15 MED ORDER — KETAMINE HCL 10 MG/ML IJ SOLN
INTRAMUSCULAR | Status: DC | PRN
  Administered 2017-09-15: 50 mg via INTRAVENOUS
  Administered 2017-09-15: 10 mg via INTRAVENOUS

## 2017-09-15 MED ORDER — LIDOCAINE-EPINEPHRINE 1 %-1:100000 IJ SOLN
INTRAMUSCULAR | Status: AC
  Filled 2017-09-15: qty 1

## 2017-09-15 MED ORDER — LIDOCAINE-EPINEPHRINE 1 %-1:100000 IJ SOLN
INTRAMUSCULAR | Status: DC | PRN
  Administered 2017-09-15: 9.5 mL

## 2017-09-15 MED ORDER — DEXAMETHASONE SODIUM PHOSPHATE 10 MG/ML IJ SOLN
INTRAMUSCULAR | Status: DC | PRN
  Administered 2017-09-15: 10 mg via INTRAVENOUS

## 2017-09-15 MED ORDER — MIDAZOLAM HCL 5 MG/5ML IJ SOLN
INTRAMUSCULAR | Status: DC | PRN
  Administered 2017-09-15: 2 mg via INTRAVENOUS

## 2017-09-15 MED ORDER — FENTANYL CITRATE (PF) 100 MCG/2ML IJ SOLN
INTRAMUSCULAR | Status: DC | PRN
  Administered 2017-09-15 (×2): 50 ug via INTRAVENOUS
  Administered 2017-09-15: 100 ug via INTRAVENOUS
  Administered 2017-09-15: 50 ug via INTRAVENOUS
  Administered 2017-09-15: 100 ug via INTRAVENOUS
  Administered 2017-09-15 (×2): 50 ug via INTRAVENOUS

## 2017-09-15 MED ORDER — BUPIVACAINE HCL (PF) 0.5 % IJ SOLN
INTRAMUSCULAR | Status: AC
  Filled 2017-09-15: qty 30

## 2017-09-15 MED ORDER — KETAMINE HCL-SODIUM CHLORIDE 100-0.9 MG/10ML-% IV SOSY
PREFILLED_SYRINGE | INTRAVENOUS | Status: AC
  Filled 2017-09-15: qty 10

## 2017-09-15 MED ORDER — LIDOCAINE HCL (CARDIAC) 20 MG/ML IV SOLN
INTRAVENOUS | Status: DC | PRN
  Administered 2017-09-15: 60 mg via INTRAVENOUS

## 2017-09-15 MED ORDER — MIDAZOLAM HCL 2 MG/2ML IJ SOLN
INTRAMUSCULAR | Status: AC
  Filled 2017-09-15: qty 2

## 2017-09-15 MED ORDER — NAFCILLIN SODIUM 2 G IJ SOLR: 2.0000 g | INTRAVENOUS | Status: DC

## 2017-09-15 MED ORDER — SODIUM CHLORIDE 0.9 % IV SOLN: INTRAVENOUS | Status: DC

## 2017-09-15 MED ORDER — ROCURONIUM BROMIDE 100 MG/10ML IV SOLN
INTRAVENOUS | Status: DC | PRN
  Administered 2017-09-15: 10 mg via INTRAVENOUS
  Administered 2017-09-15: 60 mg via INTRAVENOUS
  Administered 2017-09-15: 30 mg via INTRAVENOUS

## 2017-09-15 MED ORDER — SUCCINYLCHOLINE CHLORIDE 20 MG/ML IJ SOLN
INTRAMUSCULAR | Status: DC | PRN
  Administered 2017-09-15: 120 mg via INTRAVENOUS

## 2017-09-15 MED ORDER — BACITRACIN ZINC 500 UNIT/GM EX OINT
TOPICAL_OINTMENT | CUTANEOUS | Status: DC | PRN
  Administered 2017-09-15: 1 via TOPICAL

## 2017-09-15 MED ORDER — THROMBIN (RECOMBINANT) 5000 UNITS EX SOLR
CUTANEOUS | Status: AC
  Filled 2017-09-15: qty 5000

## 2017-09-15 MED ORDER — SODIUM CHLORIDE 0.9 % IV SOLN
INTRAVENOUS | Status: DC
  Administered 2017-09-15: 17:00:00 via INTRAVENOUS
  Administered 2017-09-16: 125 mL via INTRAVENOUS

## 2017-09-15 MED ORDER — FENTANYL CITRATE (PF) 250 MCG/5ML IJ SOLN
INTRAMUSCULAR | Status: AC
  Filled 2017-09-15: qty 5

## 2017-09-15 MED ORDER — MENTHOL 3 MG MT LOZG: 1.0000 | LOZENGE | OROMUCOSAL | Status: DC | PRN

## 2017-09-15 MED ORDER — ESMOLOL HCL 100 MG/10ML IV SOLN
INTRAVENOUS | Status: DC | PRN
  Administered 2017-09-15 (×2): 40 mg via INTRAVENOUS

## 2017-09-15 MED ORDER — PHENYLEPHRINE 40 MCG/ML (10ML) SYRINGE FOR IV PUSH (FOR BLOOD PRESSURE SUPPORT)
PREFILLED_SYRINGE | INTRAVENOUS | Status: AC
  Filled 2017-09-15: qty 10

## 2017-09-15 MED ORDER — BACITRACIN ZINC 500 UNIT/GM EX OINT
TOPICAL_OINTMENT | CUTANEOUS | Status: AC
  Filled 2017-09-15: qty 28.35

## 2017-09-15 MED ORDER — BUPIVACAINE HCL (PF) 0.5 % IJ SOLN
INTRAMUSCULAR | Status: DC | PRN
  Administered 2017-09-15: 9.5 mL

## 2017-09-15 MED ORDER — PHENYLEPHRINE HCL 10 MG/ML IJ SOLN
INTRAMUSCULAR | Status: DC | PRN
  Administered 2017-09-15: 20 ug/min via INTRAVENOUS

## 2017-09-15 MED ORDER — NAFCILLIN SODIUM 2 G IJ SOLR
2.0000 g | INTRAVENOUS | Status: DC
  Administered 2017-09-15 – 2017-09-27 (×69): 2 g via INTRAVENOUS
  Filled 2017-09-15 (×79): qty 2000

## 2017-09-15 MED ORDER — PHENOL 1.4 % MT LIQD: 1.0000 | OROMUCOSAL | Status: DC | PRN

## 2017-09-15 MED ORDER — LACTATED RINGERS IV SOLN
INTRAVENOUS | Status: DC
  Administered 2017-09-15: 12:00:00 via INTRAVENOUS

## 2017-09-15 MED ORDER — DEXAMETHASONE SODIUM PHOSPHATE 10 MG/ML IJ SOLN
INTRAMUSCULAR | Status: AC
  Filled 2017-09-15: qty 1

## 2017-09-15 MED ORDER — ROCURONIUM BROMIDE 10 MG/ML (PF) SYRINGE
PREFILLED_SYRINGE | INTRAVENOUS | Status: AC
  Filled 2017-09-15: qty 10

## 2017-09-15 MED ORDER — PROPOFOL 10 MG/ML IV BOLUS
INTRAVENOUS | Status: DC | PRN
  Administered 2017-09-15: 200 mg via INTRAVENOUS
  Administered 2017-09-15: 150 mg via INTRAVENOUS

## 2017-09-15 MED ORDER — THROMBIN (RECOMBINANT) 5000 UNITS EX SOLR
CUTANEOUS | Status: DC | PRN
  Administered 2017-09-15: 13:00:00 via TOPICAL

## 2017-09-15 SURGICAL SUPPLY — 71 items
BAG DECANTER FOR FLEXI CONT (MISCELLANEOUS) ×3 IMPLANT
BENZOIN TINCTURE PRP APPL 2/3 (GAUZE/BANDAGES/DRESSINGS) ×3 IMPLANT
BIT DRILL 2.4 (BIT) ×2 IMPLANT
BIT DRILL 2.4MM (BIT) ×1
BLADE CLIPPER SURG (BLADE) ×3 IMPLANT
BLADE ULTRA TIP 2M (BLADE) IMPLANT
BUR MATCHSTICK NEURO 3.0 LAGG (BURR) ×3 IMPLANT
BUR PRECISION FLUTE 5.0 (BURR) ×6 IMPLANT
CANISTER SUCT 3000ML PPV (MISCELLANEOUS) ×3 IMPLANT
CARTRIDGE OIL MAESTRO DRILL (MISCELLANEOUS) ×1 IMPLANT
CLOSURE WOUND 1/2 X4 (GAUZE/BANDAGES/DRESSINGS) ×1
DECANTER SPIKE VIAL GLASS SM (MISCELLANEOUS) ×3 IMPLANT
DERMABOND ADVANCED (GAUZE/BANDAGES/DRESSINGS) ×2
DERMABOND ADVANCED .7 DNX12 (GAUZE/BANDAGES/DRESSINGS) ×1 IMPLANT
DIFFUSER DRILL AIR PNEUMATIC (MISCELLANEOUS) ×3 IMPLANT
DRAPE C-ARM 42X72 X-RAY (DRAPES) ×6 IMPLANT
DRAPE LAPAROTOMY 100X72 PEDS (DRAPES) ×3 IMPLANT
DRAPE POUCH INSTRU U-SHP 10X18 (DRAPES) ×3 IMPLANT
DRSG OPSITE POSTOP 4X8 (GAUZE/BANDAGES/DRESSINGS) ×3 IMPLANT
DURAPREP 6ML APPLICATOR 50/CS (WOUND CARE) ×3 IMPLANT
ELECT REM PT RETURN 9FT ADLT (ELECTROSURGICAL) ×3
ELECTRODE REM PT RTRN 9FT ADLT (ELECTROSURGICAL) ×1 IMPLANT
EVACUATOR 1/8 PVC DRAIN (DRAIN) ×3 IMPLANT
GAUZE SPONGE 4X4 12PLY STRL (GAUZE/BANDAGES/DRESSINGS) IMPLANT
GAUZE SPONGE 4X4 12PLY STRL LF (GAUZE/BANDAGES/DRESSINGS) ×3 IMPLANT
GAUZE SPONGE 4X4 16PLY XRAY LF (GAUZE/BANDAGES/DRESSINGS) IMPLANT
GLOVE BIO SURGEON STRL SZ7 (GLOVE) IMPLANT
GLOVE BIO SURGEON STRL SZ8 (GLOVE) ×3 IMPLANT
GLOVE BIO SURGEON STRL SZ8.5 (GLOVE) ×3 IMPLANT
GLOVE BIOGEL PI IND STRL 7.0 (GLOVE) IMPLANT
GLOVE BIOGEL PI IND STRL 7.5 (GLOVE) ×3 IMPLANT
GLOVE BIOGEL PI INDICATOR 7.0 (GLOVE)
GLOVE BIOGEL PI INDICATOR 7.5 (GLOVE) ×6
GLOVE ECLIPSE 7.0 STRL STRAW (GLOVE) ×3 IMPLANT
GOWN STRL REUS W/ TWL LRG LVL3 (GOWN DISPOSABLE) ×2 IMPLANT
GOWN STRL REUS W/ TWL XL LVL3 (GOWN DISPOSABLE) ×1 IMPLANT
GOWN STRL REUS W/TWL 2XL LVL3 (GOWN DISPOSABLE) IMPLANT
GOWN STRL REUS W/TWL LRG LVL3 (GOWN DISPOSABLE) ×4
GOWN STRL REUS W/TWL XL LVL3 (GOWN DISPOSABLE) ×2
GRAFT BN 5X1XSPNE CVD POST DBM (Bone Implant) ×1 IMPLANT
GRAFT BONE MAGNIFUSE 1X5CM (Bone Implant) ×2 IMPLANT
HEMOSTAT POWDER KIT SURGIFOAM (HEMOSTASIS) ×3 IMPLANT
KIT BASIN OR (CUSTOM PROCEDURE TRAY) ×3 IMPLANT
KIT ROOM TURNOVER OR (KITS) ×3 IMPLANT
NEEDLE HYPO 25X1 1.5 SAFETY (NEEDLE) ×3 IMPLANT
NS IRRIG 1000ML POUR BTL (IV SOLUTION) ×3 IMPLANT
OIL CARTRIDGE MAESTRO DRILL (MISCELLANEOUS) ×3
PACK LAMINECTOMY NEURO (CUSTOM PROCEDURE TRAY) ×3 IMPLANT
PAD ARMBOARD 7.5X6 YLW CONV (MISCELLANEOUS) ×9 IMPLANT
PIN MAYFIELD SKULL DISP (PIN) ×3 IMPLANT
ROD PRE CUT 3.5X40MM SPINAL (Rod) ×3 IMPLANT
ROD PRECUT 3.5X60 (Rod) ×3 IMPLANT
SCREW MULTI AXIAL 3.5X14MM (Screw) ×18 IMPLANT
SCREW MULTI AXIAL 3.5X16MM (Screw) ×3 IMPLANT
SCREW SET 3600315 STANDARD (Screw) ×21 IMPLANT
SCREW SET 3600315 STD (Screw) ×7 IMPLANT
SPONGE LAP 4X18 X RAY DECT (DISPOSABLE) IMPLANT
SPONGE SURGIFOAM ABS GEL 100 (HEMOSTASIS) ×3 IMPLANT
STAPLER SKIN PROX WIDE 3.9 (STAPLE) ×3 IMPLANT
STRIP CLOSURE SKIN 1/2X4 (GAUZE/BANDAGES/DRESSINGS) ×2 IMPLANT
SUT ETHILON 3 0 FSL (SUTURE) IMPLANT
SUT VIC AB 0 CT1 18XCR BRD8 (SUTURE) ×3 IMPLANT
SUT VIC AB 0 CT1 8-18 (SUTURE) ×6
SUT VIC AB 2-0 CT1 18 (SUTURE) ×6 IMPLANT
SUT VICRYL 3-0 RB1 18 ABS (SUTURE) ×3 IMPLANT
SWAB CULTURE LIQ STUART DBL (MISCELLANEOUS) ×6 IMPLANT
SWAB CULTURE LIQUID MINI MALE (MISCELLANEOUS) ×6 IMPLANT
TAPE CLOTH SURG 4X10 WHT LF (GAUZE/BANDAGES/DRESSINGS) ×3 IMPLANT
TOWEL GREEN STERILE (TOWEL DISPOSABLE) ×3 IMPLANT
TOWEL GREEN STERILE FF (TOWEL DISPOSABLE) ×3 IMPLANT
WATER STERILE IRR 1000ML POUR (IV SOLUTION) ×3 IMPLANT

## 2017-09-15 NOTE — Transfer of Care (Signed)
Immediate Anesthesia Transfer of Care Note  Patient: [name]  Procedure(s) Performed: POSTERIOR CERVICAL FUSION/FORAMINOTOMY CERVICAL THREE-FOUR, CERVICAL FOUR-FIVE, CERVICAL FIVE-SIX, CERVICAL SIX-SEVEN (N/A Spine Cervical)  Patient Location: PACU  Anesthesia Type:General  Level of Consciousness: drowsy, patient cooperative and responds to stimulation  Airway & Oxygen Therapy: Patient Spontanous Breathing and Patient connected to nasal cannula oxygen  Post-op Assessment: Report given to RN and Post -op Vital signs reviewed and stable  Post vital signs: Reviewed and stable  Last Vitals:  Vitals:   09/15/17 0400 09/15/17 0730  BP: 136/80 (!) 140/91  Pulse: 63   Resp: (!) 36   Temp: 36.6 C 36.6 C  SpO2: 94%     Last Pain:  Vitals:   09/15/17 0841  TempSrc:   PainSc: 0-No pain      Patients Stated Pain Goal: 0 (01/07/93 5859)  Complications: No apparent anesthesia complications

## 2017-09-15 NOTE — Clinical Social Work Note (Signed)
Clinical Social Work Assessment  Patient Details  Name: [name] MRN: [id number] Date of Birth: April 25, 1966  Date of referral:  09/15/17               Reason for consult:  Substance Use/ETOH Abuse, Housing Concerns/Homelessness                Permission sought to share information with:  Case Manager Permission granted to share information::  Yes, Verbal Permission Granted  Name::        Agency::     Relationship::     Contact Information:     Housing/Transportation Living arrangements for the past 2 months:  International Paper of Information:  Patient Patient Interpreter Needed:  None Criminal Activity/Legal Involvement Pertinent to Current Situation/Hospitalization:  No - Comment as needed Significant Relationships:  Siblings Lives with:  Other (Comment)(Boarding House) Do you feel safe going back to the place where you live?  Yes Need for family participation in patient care:  Yes (Comment)  Care giving concerns: CSW was consulted for homeless issues and substance abuse. Pt was withdrawn during consult. Pt did not express substance abuse concerns to CSW at this time.    Social Worker assessment / plan:  CSW was consulted for homeless issues and substance abuse. CSW met with pt via bedside. Pt explained to CSW that she was recently released from prison (2 weeks ago). Pt stated she has been staying at a boarding house since then. Pt informed CSW when discharged she could potentially go stay with her sister. When asked about wanting substance abuse resources, pt stated she did not need them. Pt was agreeable to receiving resources for housing.  CSW provided pt resources for housing and included substance abuse resources if pt changes her mind. CSW informed pt that social worker department will continue to check in on her as her status changes and her discharge approaches.   Employment status:  Unemployed(Released from jail 2 weeks ago) Insurance underwriter information:  Self Pay  (Medicaid Pending) PT Recommendations:  Not assessed at this time Information / Referral to community resources:  Shelter, Outpatient Substance Abuse Treatment Options  Patient/Family's Response to care:  Pt was withdrawn, but agreeable to care plan.   Patient/Family's Understanding of and Emotional Response to Diagnosis, Current Treatment, and Prognosis: No questions or concerns were presented to CSW at this time.   Emotional Assessment Appearance:  Appears stated age Attitude/Demeanor/Rapport:    Affect (typically observed):  Restless, Quiet, Agitated, Flat Orientation:  Oriented to Self, Oriented to Situation, Oriented to Place, Oriented to  Time Alcohol / Substance use:    Psych involvement (Current and /or in the community):  No (Comment)  Discharge Needs  Concerns to be addressed:  No discharge needs identified Readmission within the last 30 days:  No Current discharge risk:  Substance Abuse Barriers to Discharge:  Active Substance Use   Wetzel Bjornstad, LCSWA 09/15/2017, 9:48 AM

## 2017-09-15 NOTE — Anesthesia Postprocedure Evaluation (Signed)
Anesthesia Post Note  Patient: MATEA STANARD  Procedure(s) Performed: POSTERIOR CERVICAL FUSION/FORAMINOTOMY CERVICAL THREE-FOUR, CERVICAL FOUR-FIVE, CERVICAL FIVE-SIX, CERVICAL SIX-SEVEN (N/A Spine Cervical)     Patient location during evaluation: PACU Anesthesia Type: General Level of consciousness: awake and alert Pain management: pain level controlled Vital Signs Assessment: post-procedure vital signs reviewed and stable Respiratory status: spontaneous breathing, nonlabored ventilation, respiratory function stable and patient connected to nasal cannula oxygen Cardiovascular status: blood pressure returned to baseline and stable Postop Assessment: no apparent nausea or vomiting Anesthetic complications: no    Last Vitals:  Vitals:   09/15/17 1453 09/15/17 1508  BP: 127/85 124/75  Pulse: (!) 101   Resp: (!) 25 (!) 22  Temp:    SpO2: 90% 97%    Last Pain:  Vitals:   09/15/17 0841  TempSrc:   PainSc: 0-No pain    LLE Motor Response: Purposeful movement (09/15/17 1508)   RLE Motor Response: Purposeful movement (09/15/17 1508)        Tiajuana Amass

## 2017-09-15 NOTE — Anesthesia Procedure Notes (Signed)
Central Venous Catheter Insertion Performed by: Suzette Battiest, MD, anesthesiologist Start/End11/21/2018 11:45 AM, 09/15/2017 11:55 AM Patient location: Pre-op. Preanesthetic checklist: patient identified, IV checked, site marked, risks and benefits discussed, surgical consent, monitors and equipment checked, pre-op evaluation, timeout performed and anesthesia consent Position: Trendelenburg Lidocaine 1% used for infiltration and patient sedated Hand hygiene performed , maximum sterile barriers used  and Seldinger technique used Catheter size: 8 Fr Total catheter length 16. Central line was placed.Double lumen Procedure performed using ultrasound guided technique. Ultrasound Notes:anatomy identified, needle tip was noted to be adjacent to the nerve/plexus identified, no ultrasound evidence of intravascular and/or intraneural injection and image(s) printed for medical record Attempts: 1 Following insertion, dressing applied, line sutured and Biopatch. Post procedure assessment: blood return through all ports  Patient tolerated the procedure well with no immediate complications.

## 2017-09-15 NOTE — Anesthesia Procedure Notes (Addendum)
Procedure Name: Intubation Date/Time: 09/15/2017 11:35 AM Performed by: White, Amedeo Plenty, CRNA Pre-anesthesia Checklist: Patient identified, Emergency Drugs available, Suction available and Patient being monitored Patient Re-evaluated:Patient Re-evaluated prior to induction Oxygen Delivery Method: Circle System Utilized Preoxygenation: Pre-oxygenation with 100% oxygen Induction Type: IV induction Ventilation: Mask ventilation without difficulty Laryngoscope Size: Glidescope and 3 (elective glidescope due to neuropathies) Grade View: Grade I Tube type: Oral Tube size: 7.0 mm Number of attempts: 1 Airway Equipment and Method: Stylet and Video-laryngoscopy Placement Confirmation: ETT inserted through vocal cords under direct vision,  positive ETCO2 and breath sounds checked- equal and bilateral Secured at: 22 cm Tube secured with: Tape Dental Injury: Teeth and Oropharynx as per pre-operative assessment

## 2017-09-15 NOTE — OR Nursing (Signed)
Patient arrived to OR with ankle monitoring bracelet attached to left lower leg. Bovie pad was placed on right posterior thigh and gauze was used to cushion/insulate the ankle bracelet.

## 2017-09-15 NOTE — Op Note (Signed)
PREOP DIAGNOSIS:  1. Spinal epidural abscess 2. Spinal cord compression   POSTOP DIAGNOSIS: Same  PROCEDURE: 1. C3-C7 laminectomy for decompression of spinal cord and debridement of epidural abscess 2. Posterior segmental instrumentation, C3-C7 lateral mass screws -Medtronic vertex 3. Posterolateral arthrodesis, C3-C7 4. Use of nonstructural bone allograft -[employer]tronic Magnifuse  SURGEON: Dr. Consuella Lose, MD  ASSISTANT:  Dr. Newman Pies, MD  ANESTHESIA: General Endotracheal  EBL:  100 cc  SPECIMENS:   Paraspinal abscess for Gram stain, culture  Spinal epidural abscess for Gram stain, culture  DRAINS:  Subfascial medium Hemovac  COMPLICATIONS:  None immediate  CONDITION:  Stable to postanesthesia care unit  HISTORY: [name] is a 51 y.o. female  Admitted to the hospital with severe neck pain and a history of intravenous drug use.  She was found to be septic upon admission.  MRI of the thoracic and cervical spine demonstrated ventral epidural abscess in the upper thoracic spine, and dorsal epidural abscess in the cervical spine.  Initially after admission she appeared to be neurologically intact, however the morning of surgery she was found to have new urinary retention.  Decompression was therefore indicated.  Risks and benefits of the surgery were explained in detail to the patient.  After all questions were answered informed consent was obtained and witnessed.  PROCEDURE IN DETAIL: After informed consent was obtained and witnessed, the patient was brought to the operating room. After induction of general anesthesia, the patient was positioned on the operative table in the   Mayfield head holder in the prone position. All pressure points were meticulously padded. Skin incision was then marked out and prepped and draped in the usual sterile fashion.   after time-out was conducted, incision was infiltrated with local anestheticinephrine.  Incision was then made  sharply and Bovie electrocautery was used to dissect through subcutaneous tissue.  The nuchal fascia was then divided in the midline, and the avascular midline plane was dissected until the spinous processes of the cervical spine were identified.  Subperiosteal dissection was then carried out along the bottom of C2 down to the top of C7.   On the right side, adjacent to the right C2-3 facet complex, there was a pocket of pus which was cultured. Self-retaining retractors were then placed.  I had dissected was placed to confirm location at the correct levels.  High-speed drill was then used to create entry points for lateral mass screws bilaterally at C3, C4, C5, and C6.  Drill was then used to create pilot holes which were tapped to approximately 14 millimeters bilaterally except for left C6 which was tapped to 16 millimeters.  I did note significantly  Destroyed C2-3 facet complex on the right.  The  C3 lateral mass was relatively small.  I was unable to place a lateral mass screw pilot hole at this level.  Also during creation of the right C6 pilot hole, bone quality was noted to be very soft, and the lateral portion of the lateral mass was fractured.  I was able to create a more medial trajectory to instrument C6.    Attention was then turned to decompression.  Using the high-speed drill, troughs were drilled bilaterally from the top of C3 down to C6.  Kerrison Stann Mainland within used to cut the ligamentum flavum at the top of C3 as well as the bottom of C6.  The lamina was then removed en bloc.  I noted thick epidural phlegmon on the dorsal surface of the thecal sac.  This was also cultured.  I did remove the top of C7 with large Kerrison Rogers.  There did not appear to be any significant dorsal phlegmon at that level. There was no liquid pus making any attempt at debriding the ventral thoracic abscess futile.    High-speed drill was then used to decorticate the lateral masses bilaterally from C3-C7.  The  lateral mass screws were then placed, set screws were placed after a rod was placed and final tightened.  Magnifuse was then placed in the lateral gutters.  Final AP and lateral fluoroscopy was used to confirm good location of the implanted hardware.  Hemostasis was then achieved with a combination of morselized Gelfoam with thrombin and bipolar electrocautery.    Subfascial drain was then placed and tunneled subcutaneously.  The wound was then irrigated with copious amounts of antibiotic saline.  The wound was then closed in multiple layers with a combination of 0 and 2 0 Vicryl stitches.  The skin was closed with standard surgical skin staples.  Sterile dressing was then applied.  The patient was then transferred to the stretcher and the Mayfield head holder was removed.  At the end of the case all sponge, needle, instrument, and cottonoid counts were correct.

## 2017-09-15 NOTE — Anesthesia Procedure Notes (Signed)
Arterial Line Insertion Start/End11/21/2018 11:10 AM Performed by: White, Amedeo Plenty, Immunologist, CRNA  Preanesthetic checklist: patient identified, IV checked, site marked, risks and benefits discussed, surgical consent, monitors and equipment checked, pre-op evaluation, timeout performed and anesthesia consent Lidocaine 1% used for infiltration radial was placed Catheter size: 20 G Hand hygiene performed  and maximum sterile barriers used   Attempts: 1 Procedure performed without using ultrasound guided technique. Following insertion, Biopatch and dressing applied. Post procedure assessment: normal  Patient tolerated the procedure well with no immediate complications.

## 2017-09-15 NOTE — Anesthesia Preprocedure Evaluation (Addendum)
Anesthesia Evaluation  Patient identified by MRN, date of birth, ID band Patient awake    Reviewed: Allergy & Precautions, NPO status , Patient's Chart, lab work & pertinent test results  Airway Mallampati: III  TM Distance: >3 FB Neck ROM: Full    Dental  (+) Dental Advisory Given   Pulmonary Current Smoker,    breath sounds clear to auscultation       Cardiovascular negative cardio ROS   Rhythm:Regular Rate:Normal     Neuro/Psych Anxiety Depression Large epidural abscess    GI/Hepatic negative GI ROS, (+)     substance abuse  methamphetamine use,   Endo/Other  negative endocrine ROS  Renal/GU negative Renal ROS     Musculoskeletal   Abdominal   Peds  Hematology  (+) anemia ,   Anesthesia Other Findings   Reproductive/Obstetrics                            Lab Results  Component Value Date   WBC 21.1 (H) 09/15/2017   HGB 11.7 (L) 09/15/2017   HCT 35.3 (L) 09/15/2017   MCV 91.7 09/15/2017   PLT 362 09/15/2017   Lab Results  Component Value Date   CREATININE 0.76 09/15/2017   BUN 7 09/15/2017   NA 138 09/15/2017   K 3.9 09/15/2017   CL 101 09/15/2017   CO2 28 09/15/2017    Anesthesia Physical Anesthesia Plan  ASA: III and emergent  Anesthesia Plan: General   Post-op Pain Management:    Induction: Intravenous  PONV Risk Score and Plan: 2 and Treatment may vary due to age or medical condition, Dexamethasone and Ondansetron  Airway Management Planned: Oral ETT  Additional Equipment: Arterial line, CVP and Ultrasound Guidance Line Placement  Intra-op Plan:   Post-operative Plan: Extubation in OR and Possible Post-op intubation/ventilation  Informed Consent: I have reviewed the patients History and Physical, chart, labs and discussed the procedure including the risks, benefits and alternatives for the proposed anesthesia with the patient or authorized representative  who has indicated his/her understanding and acceptance.   Dental advisory given  Plan Discussed with: CRNA  Anesthesia Plan Comments:        Anesthesia Quick Evaluation

## 2017-09-15 NOTE — Progress Notes (Signed)
[facility] for Infectious Disease    Date of Admission:  09/11/2017   Total days of antibiotics 5        Day 3 nafcillin           ID: CASONDRA GASCA is a 51 y.o. female with MSSA bacteremia with cervico-thoracic epidural SEA Principal Problem:   Severe sepsis ([place]) Active Problems:   Hypokalemia   Lymphadenopathy   Back pain   Hyponatremia   Right ovarian cyst   Hypotension   MSSA bacteremia   Abscess in epidural space of thoracic spine   Mediastinal mass   Obesity, Class III, BMI 40-49.9 (morbid obesity) (H[facility])    Subjective: has urinary obstruction, patient reports having worsening neck pain  Medications:  . [MAR Hold] docusate sodium  100 mg Oral BID  . [MAR Hold] potassium chloride  40 mEq Oral Once    Objective: Vital signs in last 24 hours: Temp:  [97.8 F (36.6 C)-98.6 F (37 C)] 97.8 F (36.6 C) (11/21 0730) Pulse Rate:  [63-100] 63 (11/21 0400) Resp:  [14-36] 36 (11/21 0400) BP: (109-140)/(73-91) 140/91 (11/21 0730) SpO2:  [92 %-97 %] 94 % (11/21 0400) Physical Exam  Constitutional:  oriented to person, place, and time. appears well-developed and well-nourished. No distress.  HENT: [place]/AT, PERRLA, no scleral icterus Mouth/Throat: Oropharynx is clear and moist. No oropharyngeal exudate.  Cardiovascular: Normal rate, regular rhythm and normal heart sounds. Exam reveals no gallop and no friction rub.  No murmur heard.  Pulmonary/Chest: Effort normal and breath sounds normal. No respiratory distress.  has no wheezes.  Neck = supple, no nuchal rigidity Abdominal: Soft. Bowel sounds are normal.  exhibits no distension. There is no tenderness.  Neurological: alert and oriented to person, place, and time. Moves hands and feet, limited neck movement due to pain Skin: Skin is warm and dry. No rash noted. No erythema.  Psychiatric: a normal mood and affect.  behavior is normal.    Lab Results Recent Labs    09/14/17 0401 09/15/17 0557  WBC 19.0*  21.1*  HGB 12.2 11.7*  HCT 37.4 35.3*  NA 137 138  K 3.3* 3.9  CL 102 101  CO2 28 28  BUN 6 7  CREATININE 0.71 0.76   Liver Panel Recent Labs    09/13/17 0347 09/14/17 0401  PROT 5.6* 5.2*  ALBUMIN 2.1* 1.9*  AST 41 36  ALT 19 16  ALKPHOS 373* 513*  BILITOT 2.0* 3.5*   Sedimentation Rate Recent Labs    09/13/17 0347  ESRSEDRATE 47*   C-Reactive Protein Recent Labs    09/13/17 0347  CRP 24.8*    Microbiology: 11/17 blood cx -MSSA 11/18 blood cx -MSSA 11/19 blood cx -MSSA Studies/Results: Mr Cervical Spine W Wo Contrast  Result Date: 09/14/2017 CLINICAL DATA:  History of IV drug abuse presenting with severe back pain status post recent back surgery. MRI of the thoracic and lumbar spine showed epidural abscess extending into the cervical spine. EXAM: MRI CERVICAL SPINE WITHOUT AND WITH CONTRAST TECHNIQUE: Multiplanar and multiecho pulse sequences of the cervical spine, to include the craniocervical junction and cervicothoracic junction, were obtained without and with intravenous contrast. CONTRAST:  22mL MULTIHANCE GADOBENATE DIMEGLUMINE 529 MG/ML IV SOLN COMPARISON:  None. FINDINGS: Despite efforts by the technologist and patient, motion artifact is present on today's exam and could not be eliminated. This reduces exam sensitivity and specificity. Alignment: Reversal of the normal cervical lordosis centered at C4-C5. Vertebrae: Large right facet joint  effusion at C2-C3 with surrounding periarticular soft tissue inflammatory changes. Small right facet joint effusion at C3-C4. No fracture, evidence of discitis, or bone lesion. Cord: Again seen is a ventral epidural fluid collection extending from C7 into the thoracic spine below the field of view. At the level of C7-T1, this fluid collection appears to wrap around into the posterior epidural space, where it continues superiorly to the level of C2, measuring up to 5 mm in AP dimension. The epidural fluid collection also involves  the right lateral epidural space at C2. The spinal cord is displaced anteriorly from C2 through C6, with severe narrowing of thecal sac. No spinal cord edema. Posterior Fossa, vertebral arteries, paraspinal tissues: Nonspecific suboccipital and posterior upper cervical paraspinal soft tissue edema. Otherwise negative. Disc levels: C2-C3: Bilateral facet arthropathy. No spinal canal or neuroforaminal stenosis. C3-C4: Right facet uncovertebral hypertrophy resulting in mild right neuroforaminal stenosis. C4-C5: Posterior disc osteophyte complex and bilateral uncovertebral hypertrophy resulting in mild central spinal canal stenosis and mild bilateral neuroforaminal stenosis. C5-C6: Posterior disc osteophyte complex and bilateral facet uncovertebral hypertrophy resulting in moderate left and mild right neuroforaminal stenosis. C6-C7: Left paracentral and foraminal disc protrusion resulting in mild narrowing of left aspect of the spinal canal and severe left neuroforaminal stenosis. Moderate right neuroforaminal stenosis due to uncovertebral hypertrophy. C7-T1:  Negative. IMPRESSION: 1. Epidural abscess extending from C2 into the thoracic spine. At the C7 level, the ventral epidural abscess seen on prior thoracic spine MRI wraps around into the posterior epidural space, continuing superiorly to the level of C2. There is anterior displacement and slight deformity of the cervical cord without definite evidence of cord edema, although evaluation is extremely limited by motion artifact. 2. Large right facet joint effusion at C2-C3 with surrounding periarticular soft tissue inflammatory changes, concerning for septic arthritis. Small facet joint effusion on the right at C3-C4 may also reflect septic arthritis. 3. Suboccipital and posterior upper cervical paraspinal soft tissue inflammatory changes. 4. Severe left neuroforaminal stenosis at C6-C7 due to paracentral/left foraminal disc protrusion and uncovertebral hypertrophy.  Electronically Signed   By: Titus Dubin M.D.   On: 09/14/2017 13:38     Assessment/Plan: 51yo M with MSSA bacteremia and large cervical-thoracic SEA with symptoms of urinary retention. - going to surgery for cervical laminectomy & debridement of abscess and possibly to drain ventral thoracic abscess  - continue on nafcillin 2gm IV Q 4hr. - will repeat blood cx tomorrow to see if she has cleared her bacteremia - anticipate to be treated for 8 wks - will still need TEE though wonder if can be done intra-operatively  Dr hatcher to see patient tomorrow.  [name] [facility] for Infectious Diseases Cell: [phone] Pager: [phone]  09/15/2017, 11:25 AM

## 2017-09-15 NOTE — Progress Notes (Signed)
Patient having issues with urinary rentention throughout the shift. Could only void 75-100 ml at a time on the [facility] or purewick. Bladder scanner showed 562ml in bladder, pt voided 100 ml, then did an in and out cath at 0600 and got 451ml. Post scan showed 0 ml.

## 2017-09-15 NOTE — Progress Notes (Signed)
Pt has developed urinary retention overnight, requiring in-and-out catheterization with >400cc PVR. No subjective change in strength of extremities.  EXAM:  BP (!) 140/91 (BP Location: Left Arm)   Pulse 63   Temp 97.8 F (36.6 C) (Oral)   Resp (!) 36   Ht 5\' 6"  (1.676 m)   Wt 115.8 kg (255 lb 4.7 oz)   SpO2 94%   BMI 41.21 kg/m   Awake, alert, oriented  Speech fluent, appropriate  CN grossly intact  Good strength BUE/BLE, ?slight left pronator drift but otherwise essentially normal with mild generalized weakness, 4+/5 throughout  IMPRESSION:  51 y.o. female with sepsis and large cervico-thoracic SEA with development of urinary retention.  With the development of retention, I do think that surgical decompression would be indicated at this point.  Unfortunately, the location of the upper thoracic abscess is ventral to the spinal cord making it nearly impossible to debride in its entirety over 8 thoracic levels.  The cervical abscess however is dorsal, and can be decompressed and debrided in a straightforward manner.  I therefore believe that the most appropriate surgical option for this patient would be cervical laminectomy from X8-B3-3 with the application of lateral mass screws in order to prevent subsequent kyphotic deformity.  I can attempt to pass a small catheter to try to drain the ventral thoracic abscess, however this may or may not work.   PLAN: -   We will plan on proceeding with cervical laminectomy debridement of abscess and lateral mass instrumented stabilization and fusion later this morning   I did review the indications for surgery at this point with the patient.   Basic details of the surgery were reviewed with the patient.  Expected postoperative course was also discussed.  We talked about the risks of the procedure including the risk of postoperative worsening of numbness, tingling, weakness, paralysis, bladder dysfunction.  We also discussed the risks of bleeding, and  postoperative wound infection.   We discussed general risks of anesthesia including heart attack, stroke, and blood clots.  We also discussed the possibility of future surgery for deformity.  Patient appeared to understand our discussion.  All her questions were answered.  She provided consent to proceed.

## 2017-09-15 NOTE — Progress Notes (Signed)
Progress Note    [name]  NWG:[id number] DOB: [birth year]  DOA: 09/11/2017 PCP: Marliss Coots, NP    Brief Narrative:   Chief complaint: Follow-up epidural abscesses/MSSA bacteremia.  Medical records reviewed and are as summarized below:  [name] is an 51 y.o. female w/ a history of IV drug use and chronic back pain status post previous back surgery who presented to the ED with severe back and neck pain.  An MRI of the T and L-spine revealed a ventral epidural abscess. Patient was also noted to have MSSA bacteremia.   Patient was placed empirically on IV antibiotics. ID and neurosurgery consulted.  Patient was subsequently transferred to [facility].  Significant Events: 11/17 admit to [facility]  11/18 transfer to [facility]  Assessment/Plan:   Principal Problem: Severe sepsis secondary to MSSA bacteremia - complex ventral epidural abscess extending from C2 to T8 with cord compression ID and Neurosurgery directing care of this issue. TEE scheduled. ID now concerned that surgical intervention indicated - NS following. MRI C spine completed under sedation 09/14/17 and did not reveal evidence of cord edema. Developed new urinary retention today necessitating neurosurgical intervention. Neurosurgery performed C3-C7 laminectomy for decompression of spinal cord and debridement of epidural abscess (see operative note for full details).  Active problems: Elevated alkaline phosphatase and bilirubin/hepatitis C antibody positive Levels rising. Hepatitis C antibody positive noted 05/21/2017. Monitor trend. May need an abdominal ultrasound if levels continue to rise.  Hypotension Resolved with volume resuscitation and treatment of sepsis.  Lymphadenopathy/mediastinal and paravertebral soft tissue masses A consequence of her severe infection.   Polysubstance abuse/IV drug use Possessed suspected drugs and definite drug paraphernalia at time of admission - no  clear evidence of withdrawal at this time. HIV nonreactive.  Hypokalemia Potassium repleted and added to IV fluids.  Hyponatremia Resolved.  Morbid obesity Body mass index is 41.21 kg/m.   Family Communication/Anticipated D/C date and plan/Code Status   DVT prophylaxis: SCDs ordered. Code Status: Full Code.  Family Communication: No family at bedside. Disposition Plan: Likely will need several more days in the hospital while recovering from neurosurgical intervention.   Medical Consultants:    Oncology  Infectious Disease  Neurosurgery   Anti-Infectives:   Cefazolin 11/18 > 11/19 Cefepime 09/11/2017 >09/12/2017 Vancomycin 11/17 > 11/19 Nafcillin 09/13/17 >  Subjective:   Patient feels anxious and short of breath. She continues to have ongoing back and neck pain. No nausea or vomiting. Reported new urinary retention.  Objective:    Vitals:   09/14/17 1929 09/14/17 2349 09/15/17 0400 09/15/17 0730  BP: 111/75 139/77 136/80 (!) 140/91  Pulse: 98 100 63   Resp: 20 (!) 24 (!) 36   Temp: 98.6 F (37 C) 98.3 F (36.8 C) 97.8 F (36.6 C) 97.8 F (36.6 C)  TempSrc: Oral Oral Oral Oral  SpO2: 97% 96% 94%   Weight:      Height:        Intake/Output Summary (Last 24 hours) at 09/15/2017 0820 Last data filed at 09/15/2017 0600 Gross per 24 hour  Intake 1125 ml  Output 1061 ml  Net 64 ml   Filed Weights   09/12/17 1319 09/13/17 0730 09/14/17 0831  Weight: 115.8 kg (255 lb 4.7 oz) 117.4 kg (258 lb 13.1 oz) 115.8 kg (255 lb 4.7 oz)    Exam: General: Appears older than stated age, mildly anxious. Cardiovascular: Heart sounds show a regular rate, and rhythm. No gallops or rubs. No  murmurs. No JVD. Lungs: Clear to auscultation bilaterally with good air movement. Tachypneic. No rales, rhonchi or wheezes. Abdomen: Soft, nontender, nondistended with normal active bowel sounds. No masses. No hepatosplenomegaly. Neurological: Alert and oriented 3. Moves all  extremities 4 with equal strength. Cranial nerves II through XII grossly intact. Skin: Warm and dry. No rashes or lesions. Extremities: No clubbing or cyanosis. No edema. Pedal pulses 2+. Psychiatric: Mood and affect are anxious. Insight and judgment are poor.   Data Reviewed:   I have personally reviewed following labs and imaging studies:  Labs: Labs show the following:   Basic Metabolic Panel: Recent Labs  Lab 09/10/17 0245 09/10/17 0404 09/11/17 0732 09/12/17 0312 09/12/17 0910 09/13/17 0347 09/14/17 0401  NA 132*  --  131* 137  --  137 137  K 2.6*  --  2.9* 3.7  --  3.6 3.3*  CL 98*  --  94* 103  --  104 102  CO2 24  --  30 26  --  27 28  GLUCOSE 111*  --  109* 88  --  89 93  BUN 9  --  9 7  --  <5* 6  CREATININE 0.88  --  0.80 0.66  --  0.67 0.71  CALCIUM 8.0*  --  8.3* 7.5*  --  7.7* 7.6*  MG  --  1.9 1.8  --  2.0 2.0 1.9  PHOS  --   --   --   --   --  3.7 5.0*   GFR Estimated Creatinine Clearance: 111.2 mL/min (by C-G formula based on SCr of 0.71 mg/dL). Liver Function Tests: Recent Labs  Lab 09/10/17 0245 09/12/17 0312 09/13/17 0347 09/14/17 0401  AST 32 31 41 36  ALT 28 20 19 16   ALKPHOS 84 131* 373* 513*  BILITOT 1.1 1.4* 2.0* 3.5*  PROT 6.6 5.3* 5.6* 5.2*  ALBUMIN 3.0* 2.2* 2.1* 1.9*   Coagulation profile Recent Labs  Lab 09/13/17 0347  INR 1.25    CBC: Recent Labs  Lab 09/10/17 0245 09/11/17 0732 09/12/17 0312 09/12/17 0910 09/13/17 0347 09/14/17 0401  WBC 18.9* 17.1* 19.2*  --  19.1* 19.0*  NEUTROABS 16.0* 14.4*  --  15.8* 15.3*  --   HGB 12.2 12.1 11.5*  --  13.1 12.2  HCT 34.3* 34.2* 33.8*  --  38.1 37.4  MCV 88.9 89.3 90.9  --  90.1 90.8  PLT 334 383 292  --  312 307   Cardiac Enzymes: Recent Labs  Lab 09/11/17 0732  CKTOTAL 198   Sepsis Labs: Recent Labs  Lab 09/11/17 0732 09/11/17 1343 09/12/17 0312 09/12/17 0910 09/13/17 0347 09/14/17 0401  PROCALCITON  --   --   --  2.18 4.61  --   WBC 17.1*  --  19.2*  --   19.1* 19.0*  LATICACIDVEN  --  1.3  --  2.9*  --   --     Microbiology Recent Results (from the past 240 hour(s))  Urine culture     Status: Abnormal   Collection Time: 09/10/17 12:57 AM  Result Value Ref Range Status   Specimen Description URINE, CLEAN CATCH  Final   Special Requests NONE  Final   Culture MULTIPLE SPECIES PRESENT, SUGGEST RECOLLECTION (A)  Final   Report Status 09/11/2017 FINAL  Final  Wet prep, genital     Status: Abnormal   Collection Time: 09/10/17  4:20 AM  Result Value Ref Range Status   Yeast Wet Prep HPF POC  NONE SEEN NONE SEEN Final   Trich, Wet Prep NONE SEEN NONE SEEN Final   Clue Cells Wet Prep HPF POC NONE SEEN NONE SEEN Final   WBC, Wet Prep HPF POC FEW (A) NONE SEEN Final   Sperm NONE SEEN  Final  Blood culture (routine x 2)     Status: Abnormal   Collection Time: 09/11/17  9:20 AM  Result Value Ref Range Status   Specimen Description BLOOD RIGHT ANTECUBITAL  Final   Special Requests   Final    BOTTLES DRAWN AEROBIC AND ANAEROBIC Blood Culture adequate volume   Culture  Setup Time   Final    GRAM POSITIVE COCCI IN CLUSTERS IN BOTH AEROBIC AND ANAEROBIC BOTTLES CRITICAL RESULT CALLED TO, READ BACK BY AND VERIFIED WITH: Novella Rob [id number] 09/12/2017 Mena Goes Performed at [facility], [place] [address], [place], [place] [address]    Culture STAPHYLOCOCCUS AUREUS (A)  Final   Report Status 09/14/2017 FINAL  Final   Organism ID, Bacteria STAPHYLOCOCCUS AUREUS  Final      Susceptibility   Staphylococcus aureus - MIC*    CIPROFLOXACIN <=0.5 SENSITIVE Sensitive     ERYTHROMYCIN >=8 RESISTANT Resistant     GENTAMICIN <=0.5 SENSITIVE Sensitive     OXACILLIN 0.5 SENSITIVE Sensitive     TETRACYCLINE <=1 SENSITIVE Sensitive     VANCOMYCIN <=0.5 SENSITIVE Sensitive     TRIMETH/SULFA <=10 SENSITIVE Sensitive     CLINDAMYCIN <=0.25 SENSITIVE Sensitive     RIFAMPIN <=0.5 SENSITIVE Sensitive     Inducible Clindamycin NEGATIVE Sensitive     *  STAPHYLOCOCCUS AUREUS  Blood Culture ID Panel (Reflexed)     Status: Abnormal   Collection Time: 09/11/17  9:20 AM  Result Value Ref Range Status   Enterococcus species NOT DETECTED NOT DETECTED Final   Listeria monocytogenes NOT DETECTED NOT DETECTED Final   Staphylococcus species DETECTED (A) NOT DETECTED Final    Comment: CRITICAL RESULT CALLED TO, READ BACK BY AND VERIFIED WITH: B. GREEN,PHARMD [id number] 09/12/2017 T. TYSOR    Staphylococcus aureus DETECTED (A) NOT DETECTED Final    Comment: Methicillin (oxacillin) susceptible Staphylococcus aureus (MSSA). Preferred therapy is anti staphylococcal beta lactam antibiotic (Cefazolin or Nafcillin), unless clinically contraindicated. CRITICAL RESULT CALLED TO, READ BACK BY AND VERIFIED WITH: B. GREEN,PHARMD [id number] 09/12/2017 T. TYSOR    Methicillin resistance NOT DETECTED NOT DETECTED Final   Streptococcus species NOT DETECTED NOT DETECTED Final   Streptococcus agalactiae NOT DETECTED NOT DETECTED Final   Streptococcus pneumoniae NOT DETECTED NOT DETECTED Final   Streptococcus pyogenes NOT DETECTED NOT DETECTED Final   Acinetobacter baumannii NOT DETECTED NOT DETECTED Final   Enterobacteriaceae species NOT DETECTED NOT DETECTED Final   Enterobacter cloacae complex NOT DETECTED NOT DETECTED Final   Escherichia coli NOT DETECTED NOT DETECTED Final   Klebsiella oxytoca NOT DETECTED NOT DETECTED Final   Klebsiella pneumoniae NOT DETECTED NOT DETECTED Final   Proteus species NOT DETECTED NOT DETECTED Final   Serratia marcescens NOT DETECTED NOT DETECTED Final   Haemophilus influenzae NOT DETECTED NOT DETECTED Final   Neisseria meningitidis NOT DETECTED NOT DETECTED Final   Pseudomonas aeruginosa NOT DETECTED NOT DETECTED Final   Candida albicans NOT DETECTED NOT DETECTED Final   Candida glabrata NOT DETECTED NOT DETECTED Final   Candida krusei NOT DETECTED NOT DETECTED Final   Candida parapsilosis NOT DETECTED NOT DETECTED Final   Candida  tropicalis NOT DETECTED NOT DETECTED Final    Comment: Performed at [facility], [place] [address], [place], [place] [address]  Blood culture (routine x 2)     Status: Abnormal   Collection Time: 09/11/17  9:33 AM  Result Value Ref Range Status   Specimen Description BLOOD BLOOD LEFT HAND  Final   Special Requests   Final    BOTTLES DRAWN AEROBIC AND ANAEROBIC Blood Culture adequate volume   Culture  Setup Time   Final    GRAM POSITIVE COCCI IN CLUSTERS IN BOTH AEROBIC AND ANAEROBIC BOTTLES CRITICAL VALUE NOTED.  VALUE IS CONSISTENT WITH PREVIOUSLY REPORTED AND CALLED VALUE.    Culture (A)  Final    STAPHYLOCOCCUS AUREUS SUSCEPTIBILITIES PERFORMED ON PREVIOUS CULTURE WITHIN THE LAST 5 DAYS. Performed at [facility], [place] [address], [place], [place] [address]    Report Status 09/14/2017 FINAL  Final  MRSA PCR Screening     Status: None   Collection Time: 09/11/17  1:03 PM  Result Value Ref Range Status   MRSA by PCR NEGATIVE NEGATIVE Final    Comment:        The GeneXpert MRSA Assay (FDA approved for NASAL specimens only), is one component of a comprehensive MRSA colonization surveillance program. It is not intended to diagnose MRSA infection nor to guide or monitor treatment for MRSA infections.   Culture, blood (Routine X 2) w Reflex to ID Panel     Status: Abnormal   Collection Time: 09/12/17  9:00 AM  Result Value Ref Range Status   Specimen Description BLOOD RIGHT HAND  Final   Special Requests IN PEDIATRIC BOTTLE Blood Culture adequate volume  Final   Culture  Setup Time   Final    GRAM POSITIVE COCCI IN CLUSTERS IN PEDIATRIC BOTTLE CRITICAL RESULT CALLED TO, READ BACK BY AND VERIFIED WITH: K. Caroline Sauger [id number] 09/13/2017 T. TYSOR    Culture (A)  Final    STAPHYLOCOCCUS AUREUS SUSCEPTIBILITIES PERFORMED ON PREVIOUS CULTURE WITHIN THE LAST 5 DAYS. Performed at [facility], [place] [address] [place], [place] [address]    Report Status 09/15/2017  FINAL  Final  Culture, blood (Routine X 2) w Reflex to ID Panel     Status: Abnormal   Collection Time: 09/12/17  9:15 AM  Result Value Ref Range Status   Specimen Description BLOOD BLOOD RIGHT HAND  Final   Special Requests IN PEDIATRIC BOTTLE Blood Culture adequate volume  Final   Culture  Setup Time   Final    GRAM POSITIVE COCCI IN CLUSTERS CRITICAL RESULT CALLED TO, READ BACK BY AND VERIFIED WITH: K. Caroline Sauger [id number] 09/13/2017 T. TYSOR    Culture (A)  Final    STAPHYLOCOCCUS AUREUS SUSCEPTIBILITIES PERFORMED ON PREVIOUS CULTURE WITHIN THE LAST 5 DAYS. Performed at [place] [facility], [place] [address], [place], [place] [address]    Report Status 09/15/2017 FINAL  Final  Culture, Urine     Status: None   Collection Time: 09/12/17 11:45 AM  Result Value Ref Range Status   Specimen Description URINE, CLEAN CATCH  Final   Special Requests NONE  Final   Culture   Final    NO GROWTH Performed at [facility], [place] [address], [place], [place] [address]    Report Status 09/13/2017 FINAL  Final  Culture, blood (routine x 2)     Status: Abnormal (Preliminary result)   Collection Time: 09/13/17  9:02 AM  Result Value Ref Range Status   Specimen Description BLOOD LEFT HAND  Final  Special Requests IN PEDIATRIC BOTTLE Blood Culture adequate volume  Final   Culture  Setup Time   Final    GRAM POSITIVE COCCI IN CLUSTERS IN PEDIATRIC BOTTLE CRITICAL VALUE NOTED.  VALUE IS CONSISTENT WITH PREVIOUSLY REPORTED AND CALLED VALUE.    Culture STAPHYLOCOCCUS AUREUS (A)  Final   Report Status PENDING  Incomplete  Culture, blood (routine x 2)     Status: Abnormal (Preliminary result)   Collection Time: 09/13/17  9:07 AM  Result Value Ref Range Status   Specimen Description BLOOD RIGHT HAND  Final   Special Requests IN PEDIATRIC BOTTLE Blood Culture adequate volume  Final   Culture  Setup Time   Final    GRAM POSITIVE COCCI IN CLUSTERS IN PEDIATRIC BOTTLE CRITICAL RESULT CALLED TO,  READ BACK BY AND VERIFIED WITHAlona Bene PHARMD [id number] 09/14/17 A BROWNING    Culture (A)  Final    STAPHYLOCOCCUS AUREUS SUSCEPTIBILITIES TO FOLLOW    Report Status PENDING  Incomplete    Procedures and diagnostic studies:  Mr Cervical Spine W Wo Contrast  Result Date: 09/14/2017 CLINICAL DATA:  History of IV drug abuse presenting with severe back pain status post recent back surgery. MRI of the thoracic and lumbar spine showed epidural abscess extending into the cervical spine. EXAM: MRI CERVICAL SPINE WITHOUT AND WITH CONTRAST TECHNIQUE: Multiplanar and multiecho pulse sequences of the cervical spine, to include the craniocervical junction and cervicothoracic junction, were obtained without and with intravenous contrast. CONTRAST:  31mL MULTIHANCE GADOBENATE DIMEGLUMINE 529 MG/ML IV SOLN COMPARISON:  None. FINDINGS: Despite efforts by the technologist and patient, motion artifact is present on today's exam and could not be eliminated. This reduces exam sensitivity and specificity. Alignment: Reversal of the normal cervical lordosis centered at C4-C5. Vertebrae: Large right facet joint effusion at C2-C3 with surrounding periarticular soft tissue inflammatory changes. Small right facet joint effusion at C3-C4. No fracture, evidence of discitis, or bone lesion. Cord: Again seen is a ventral epidural fluid collection extending from C7 into the thoracic spine below the field of view. At the level of C7-T1, this fluid collection appears to wrap around into the posterior epidural space, where it continues superiorly to the level of C2, measuring up to 5 mm in AP dimension. The epidural fluid collection also involves the right lateral epidural space at C2. The spinal cord is displaced anteriorly from C2 through C6, with severe narrowing of thecal sac. No spinal cord edema. Posterior Fossa, vertebral arteries, paraspinal tissues: Nonspecific suboccipital and posterior upper cervical paraspinal soft tissue edema.  Otherwise negative. Disc levels: C2-C3: Bilateral facet arthropathy. No spinal canal or neuroforaminal stenosis. C3-C4: Right facet uncovertebral hypertrophy resulting in mild right neuroforaminal stenosis. C4-C5: Posterior disc osteophyte complex and bilateral uncovertebral hypertrophy resulting in mild central spinal canal stenosis and mild bilateral neuroforaminal stenosis. C5-C6: Posterior disc osteophyte complex and bilateral facet uncovertebral hypertrophy resulting in moderate left and mild right neuroforaminal stenosis. C6-C7: Left paracentral and foraminal disc protrusion resulting in mild narrowing of left aspect of the spinal canal and severe left neuroforaminal stenosis. Moderate right neuroforaminal stenosis due to uncovertebral hypertrophy. C7-T1:  Negative. IMPRESSION: 1. Epidural abscess extending from C2 into the thoracic spine. At the C7 level, the ventral epidural abscess seen on prior thoracic spine MRI wraps around into the posterior epidural space, continuing superiorly to the level of C2. There is anterior displacement and slight deformity of the cervical cord without definite evidence of cord edema, although evaluation is extremely limited by  motion artifact. 2. Large right facet joint effusion at C2-C3 with surrounding periarticular soft tissue inflammatory changes, concerning for septic arthritis. Small facet joint effusion on the right at C3-C4 may also reflect septic arthritis. 3. Suboccipital and posterior upper cervical paraspinal soft tissue inflammatory changes. 4. Severe left neuroforaminal stenosis at C6-C7 due to paracentral/left foraminal disc protrusion and uncovertebral hypertrophy. Electronically Signed   By: Titus Dubin M.D.   On: 09/14/2017 13:38    Medications:   . docusate sodium  100 mg Oral BID  . potassium chloride  40 mEq Oral Once   Continuous Infusions: . 0.9 % NaCl with KCl 20 mEq / L 50 mL/hr at 09/13/17 1850  . nafcillin IV Stopped (09/15/17 0415)      LOS: 4 days   Jacquelynn Cree  [facility] Pager [phone]. If unable to reach me by pager, please call my cell phone at [phone].  *Please refer to [email or web address], password [email or web address] to get updated schedule on who will round on this patient, as hospitalists switch teams weekly. If 7PM-7AM, please contact night-coverage at [email or web address], password [email or web address] for any overnight needs.  09/15/2017, 8:20 AM

## 2017-09-16 DIAGNOSIS — B182 Chronic viral hepatitis C: Secondary | ICD-10-CM

## 2017-09-16 LAB — COMPREHENSIVE METABOLIC PANEL
ALBUMIN: 1.7 g/dL — AB (ref 3.5–5.0)
ALT: 12 U/L — AB (ref 14–54)
AST: 24 U/L (ref 15–41)
Alkaline Phosphatase: 621 U/L — ABNORMAL HIGH (ref 38–126)
Anion gap: 7 (ref 5–15)
BUN: 8 mg/dL (ref 6–20)
CHLORIDE: 101 mmol/L (ref 101–111)
CO2: 29 mmol/L (ref 22–32)
CREATININE: 0.63 mg/dL (ref 0.44–1.00)
Calcium: 7.4 mg/dL — ABNORMAL LOW (ref 8.9–10.3)
GFR calc Af Amer: >60 mL/min (ref >60)
GLUCOSE: 128 mg/dL — AB (ref 65–99)
POTASSIUM: 3.8 mmol/L (ref 3.5–5.1)
Sodium: 137 mmol/L (ref 135–145)
Total Bilirubin: 3.4 mg/dL — ABNORMAL HIGH (ref 0.3–1.2)
Total Protein: 5.2 g/dL — ABNORMAL LOW (ref 6.5–8.1)

## 2017-09-16 LAB — CBC
HEMATOCRIT: 31.5 % — AB (ref 36.0–46.0)
Hemoglobin: 10.6 g/dL — ABNORMAL LOW (ref 12.0–15.0)
MCH: 30.2 pg (ref 26.0–34.0)
MCHC: 33.7 g/dL (ref 30.0–36.0)
MCV: 89.7 fL (ref 78.0–100.0)
PLATELETS: 350 10*3/uL (ref 150–400)
RBC: 3.51 MIL/uL — ABNORMAL LOW (ref 3.87–5.11)
RDW: 15.2 % (ref 11.5–15.5)
WBC: 15 10*3/uL — AB (ref 4.0–10.5)

## 2017-09-16 MED ORDER — POLYETHYLENE GLYCOL 3350 17 G PO PACK
17.0000 g | PACK | Freq: Every day | ORAL | Status: DC
  Administered 2017-09-16 – 2017-09-20 (×4): 17 g via ORAL
  Filled 2017-09-16 (×7): qty 1

## 2017-09-16 MED ORDER — FLEET ENEMA 7-19 GM/118ML RE ENEM: 1.0000 | ENEMA | Freq: Every day | RECTAL | Status: DC | PRN

## 2017-09-16 MED ORDER — BISACODYL 5 MG PO TBEC: 5.0000 mg | DELAYED_RELEASE_TABLET | Freq: Every day | ORAL | Status: DC | PRN

## 2017-09-16 MED ORDER — DOCUSATE SODIUM 100 MG PO CAPS: 100.0000 mg | ORAL_CAPSULE | Freq: Every day | ORAL | Status: DC | PRN

## 2017-09-16 MED ORDER — RIFAMPIN 300 MG PO CAPS
300.0000 mg | ORAL_CAPSULE | Freq: Two times a day (BID) | ORAL | Status: DC
  Administered 2017-09-16 – 2017-11-14 (×119): 300 mg via ORAL
  Filled 2017-09-16 (×121): qty 1

## 2017-09-16 NOTE — Progress Notes (Addendum)
INFECTIOUS DISEASE PROGRESS NOTE  ID: [name] is a 51 y.o. 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   Abtx:  Anti-infectives (From admission, onward)   Start     Dose/Rate Route Frequency Ordered Stop   09/16/17 2300  nafcillin 2 g in dextrose 5 % 100 mL IVPB  Status:  Discontinued     2 g 200 mL/hr over 30 Minutes Intravenous Every 4 hours 09/15/17 2035 09/15/17 2303   09/15/17 2315  nafcillin 2 g in dextrose 5 % 100 mL IVPB     2 g 200 mL/hr over 30 Minutes Intravenous Every 4 hours 09/15/17 2303     09/15/17 1237  bacitracin 50,000 Units in sodium chloride irrigation 0.9 % 500 mL irrigation  Status:  Discontinued       As needed 09/15/17 1237 09/15/17 1433   09/13/17 1200  nafcillin 2 g in dextrose 5 % 100 mL IVPB  Status:  Discontinued     2 g 200 mL/hr over 30 Minutes Intravenous Every 4 hours 09/13/17 0918 09/15/17 2035   09/12/17 1400  ceFAZolin (ANCEF) IVPB 2g/100 mL premix  Status:  Discontinued     2 g 200 mL/hr over 30 Minutes Intravenous Every 8 hours 09/12/17 0848 09/13/17 0918   09/12/17 1000  vancomycin (VANCOCIN) 1,250 mg in sodium chloride 0.9 % 250 mL IVPB  Status:  Discontinued     1,250 mg 166.7 mL/hr over 90 Minutes Intravenous Every 12 hours 09/11/17 1904 09/12/17 0828   09/12/17 0600  ceFEPIme (MAXIPIME) 1 g in dextrose 5 % 50 mL IVPB  Status:  Discontinued     1 g 100 mL/hr over 30 Minutes Intravenous Every 8 hours 09/11/17 1904 09/12/17 0827   09/11/17 1830  vancomycin (VANCOCIN) 2,000 mg in sodium chloride 0.9 % 500 mL IVPB     2,000 mg 250  mL/hr over 120 Minutes Intravenous  Once 09/11/17 1758 09/12/17 0026   09/11/17 1815  ceFEPIme (MAXIPIME) 2 g in dextrose 5 % 50 mL IVPB     2 g 100 mL/hr over 30 Minutes Intravenous  Once 09/11/17 1809 09/11/17 2208   09/11/17 1800  piperacillin-tazobactam (ZOSYN) IVPB 3.375 g  Status:  Discontinued     3.375 g 12.5 mL/hr over 240 Minutes Intravenous Every 8 hours 09/11/17 1757 09/11/17 1808      Medications:  Scheduled: . docusate sodium  100 mg Oral BID  . potassium chloride  40 mEq Oral Once    Objective: Vital signs in last 24 hours: Temp:  [97.1 F (36.2 C)-98.8 F (37.1 C)] 97.1 F (36.2 C) (11/22 1200) Pulse Rate:  [72-107] 86 (11/22 1200) Resp:  [16-29] 21 (11/22 1200) BP: (120-179)/(68-98) 163/78 (11/22 1200) SpO2:  [90 %-100 %] 94 % (11/22 1200) Arterial Line BP: (120-174)/(65-111) 155/72 (11/22 1200)   General appearance: alert, cooperative and moderate distress Resp: clear to auscultation bilaterally Cardio: regular rate and rhythm GI: normal findings: bowel sounds normal and soft, non-tender Extremities: edema trace BLE  Lab Results Recent Labs  09/15/17 0557 09/16/17 0431  WBC 21.1* 15.0*  HGB 11.7* 10.6*  HCT 35.3* 31.5*  NA 138 137  K 3.9 3.8  CL 101 101  CO2 28 29  BUN 7 8  CREATININE 0.76 0.63   Liver Panel Recent Labs    09/14/17 0401 09/15/17 1822 09/16/17 0431  PROT 5.2*  --  5.2*  ALBUMIN 1.9*  --  1.7*  AST 36  --  24  ALT 16 15 12*  ALKPHOS 513*  --  621*  BILITOT 3.5*  --  3.4*  BILIDIR  --  3.1*  --    Sedimentation Rate No results for input(s): ESRSEDRATE in the last 72 hours. C-Reactive Protein No results for input(s): CRP in the last 72 hours.  Microbiology: Recent Results (from the past 240 hour(s))  Urine culture     Status: Abnormal   Collection Time: 09/10/17 12:57 AM  Result Value Ref Range Status   Specimen Description URINE, CLEAN CATCH  Final   Special Requests NONE  Final   Culture MULTIPLE SPECIES  PRESENT, SUGGEST RECOLLECTION (A)  Final   Report Status 09/11/2017 FINAL  Final  Wet prep, genital     Status: Abnormal   Collection Time: 09/10/17  4:20 AM  Result Value Ref Range Status   Yeast Wet Prep HPF POC NONE SEEN NONE SEEN Final   Trich, Wet Prep NONE SEEN NONE SEEN Final   Clue Cells Wet Prep HPF POC NONE SEEN NONE SEEN Final   WBC, Wet Prep HPF POC FEW (A) NONE SEEN Final   Sperm NONE SEEN  Final  Blood culture (routine x 2)     Status: Abnormal   Collection Time: 09/11/17  9:20 AM  Result Value Ref Range Status   Specimen Description BLOOD RIGHT ANTECUBITAL  Final   Special Requests   Final    BOTTLES DRAWN AEROBIC AND ANAEROBIC Blood Culture adequate volume   Culture  Setup Time   Final    GRAM POSITIVE COCCI IN CLUSTERS IN BOTH AEROBIC AND ANAEROBIC BOTTLES CRITICAL RESULT CALLED TO, READ BACK BY AND VERIFIED WITH: Novella Rob [id number] 09/12/2017 Mena Goes Performed at [facility], [address], [place], [place] [address]    Culture STAPHYLOCOCCUS AUREUS (A)  Final   Report Status 09/14/2017 FINAL  Final   Organism ID, Bacteria STAPHYLOCOCCUS AUREUS  Final      Susceptibility   Staphylococcus aureus - MIC*    CIPROFLOXACIN <=0.5 SENSITIVE Sensitive     ERYTHROMYCIN >=8 RESISTANT Resistant     GENTAMICIN <=0.5 SENSITIVE Sensitive     OXACILLIN 0.5 SENSITIVE Sensitive     TETRACYCLINE <=1 SENSITIVE Sensitive     VANCOMYCIN <=0.5 SENSITIVE Sensitive     TRIMETH/SULFA <=10 SENSITIVE Sensitive     CLINDAMYCIN <=0.25 SENSITIVE Sensitive     RIFAMPIN <=0.5 SENSITIVE Sensitive     Inducible Clindamycin NEGATIVE Sensitive     * STAPHYLOCOCCUS AUREUS  Blood Culture ID Panel (Reflexed)     Status: Abnormal   Collection Time: 09/11/17  9:20 AM  Result Value Ref Range Status   Enterococcus species NOT DETECTED NOT DETECTED Final   Listeria monocytogenes NOT DETECTED NOT DETECTED Final   Staphylococcus species DETECTED (A) NOT DETECTED Final    Comment: CRITICAL  RESULT CALLED TO, READ BACK BY AND VERIFIED WITH: B. GREEN,PHARMD [id number] 09/12/2017 T. TYSOR    Staphylococcus aureus DETECTED (A) NOT DETECTED Final    Comment: Methicillin (oxacillin) susceptible Staphylococcus aureus (MSSA). Preferred  therapy is anti staphylococcal beta lactam antibiotic (Cefazolin or Nafcillin), unless clinically contraindicated. CRITICAL RESULT CALLED TO, READ BACK BY AND VERIFIED WITH: B. GREEN,PHARMD [id number] 09/12/2017 T. TYSOR    Methicillin resistance NOT DETECTED NOT DETECTED Final   Streptococcus species NOT DETECTED NOT DETECTED Final   Streptococcus agalactiae NOT DETECTED NOT DETECTED Final   Streptococcus pneumoniae NOT DETECTED NOT DETECTED Final   Streptococcus pyogenes NOT DETECTED NOT DETECTED Final   Acinetobacter baumannii NOT DETECTED NOT DETECTED Final   Enterobacteriaceae species NOT DETECTED NOT DETECTED Final   Enterobacter cloacae complex NOT DETECTED NOT DETECTED Final   Escherichia coli NOT DETECTED NOT DETECTED Final   Klebsiella oxytoca NOT DETECTED NOT DETECTED Final   Klebsiella pneumoniae NOT DETECTED NOT DETECTED Final   Proteus species NOT DETECTED NOT DETECTED Final   Serratia marcescens NOT DETECTED NOT DETECTED Final   Haemophilus influenzae NOT DETECTED NOT DETECTED Final   Neisseria meningitidis NOT DETECTED NOT DETECTED Final   Pseudomonas aeruginosa NOT DETECTED NOT DETECTED Final   Candida albicans NOT DETECTED NOT DETECTED Final   Candida glabrata NOT DETECTED NOT DETECTED Final   Candida krusei NOT DETECTED NOT DETECTED Final   Candida parapsilosis NOT DETECTED NOT DETECTED Final   Candida tropicalis NOT DETECTED NOT DETECTED Final    Comment: Performed at [facility], [address] [place], [place] [address]  Blood culture (routine x 2)     Status: Abnormal   Collection Time: 09/11/17  9:33 AM  Result Value Ref Range Status   Specimen Description BLOOD BLOOD LEFT HAND  Final   Special Requests   Final    BOTTLES  DRAWN AEROBIC AND ANAEROBIC Blood Culture adequate volume   Culture  Setup Time   Final    GRAM POSITIVE COCCI IN CLUSTERS IN BOTH AEROBIC AND ANAEROBIC BOTTLES CRITICAL VALUE NOTED.  VALUE IS CONSISTENT WITH PREVIOUSLY REPORTED AND CALLED VALUE.    Culture (A)  Final    STAPHYLOCOCCUS AUREUS SUSCEPTIBILITIES PERFORMED ON PREVIOUS CULTURE WITHIN THE LAST 5 DAYS. Performed at [facility], [place] [address] [place], [place] [address]    Report Status 09/14/2017 FINAL  Final  MRSA PCR Screening     Status: None   Collection Time: 09/11/17  1:03 PM  Result Value Ref Range Status   MRSA by PCR NEGATIVE NEGATIVE Final    Comment:        The GeneXpert MRSA Assay (FDA approved for NASAL specimens only), is one component of a comprehensive MRSA colonization surveillance program. It is not intended to diagnose MRSA infection nor to guide or monitor treatment for MRSA infections.   Culture, blood (Routine X 2) w Reflex to ID Panel     Status: Abnormal   Collection Time: 09/12/17  9:00 AM  Result Value Ref Range Status   Specimen Description BLOOD RIGHT HAND  Final   Special Requests IN PEDIATRIC BOTTLE Blood Culture adequate volume  Final   Culture  Setup Time   Final    GRAM POSITIVE COCCI IN CLUSTERS IN PEDIATRIC BOTTLE CRITICAL RESULT CALLED TO, READ BACK BY AND VERIFIED WITH: K. Caroline Sauger [id number] 09/13/2017 T. TYSOR    Culture (A)  Final    STAPHYLOCOCCUS AUREUS SUSCEPTIBILITIES PERFORMED ON PREVIOUS CULTURE WITHIN THE LAST 5 DAYS. Performed at [facility], [place] [address] [place], [place] [address]    Report Status 09/15/2017 FINAL  Final  Culture, blood (Routine X 2) w Reflex to ID Panel  Status: Abnormal   Collection Time: 09/12/17  9:15 AM  Result Value Ref Range Status   Specimen Description BLOOD BLOOD RIGHT HAND  Final   Special Requests IN PEDIATRIC BOTTLE Blood Culture adequate volume  Final   Culture  Setup Time   Final    GRAM POSITIVE COCCI IN  CLUSTERS CRITICAL RESULT CALLED TO, READ BACK BY AND VERIFIED WITH: K. WEIGLE,PHARMD [id number] 09/13/2017 T. TYSOR    Culture (A)  Final    STAPHYLOCOCCUS AUREUS SUSCEPTIBILITIES PERFORMED ON PREVIOUS CULTURE WITHIN THE LAST 5 DAYS. Performed at [facility], [place] [address], [place], [place] [address]    Report Status 09/15/2017 FINAL  Final  Culture, Urine     Status: None   Collection Time: 09/12/17 11:45 AM  Result Value Ref Range Status   Specimen Description URINE, CLEAN CATCH  Final   Special Requests NONE  Final   Culture   Final    NO GROWTH Performed at [facility], [place] [address], [place], [place] [address]    Report Status 09/13/2017 FINAL  Final  Culture, blood (routine x 2)     Status: Abnormal (Preliminary result)   Collection Time: 09/13/17  9:02 AM  Result Value Ref Range Status   Specimen Description BLOOD LEFT HAND  Final   Special Requests IN PEDIATRIC BOTTLE Blood Culture adequate volume  Final   Culture  Setup Time   Final    GRAM POSITIVE COCCI IN CLUSTERS IN PEDIATRIC BOTTLE CRITICAL VALUE NOTED.  VALUE IS CONSISTENT WITH PREVIOUSLY REPORTED AND CALLED VALUE.    Culture STAPHYLOCOCCUS AUREUS (A)  Final   Report Status PENDING  Incomplete  Culture, blood (routine x 2)     Status: Abnormal (Preliminary result)   Collection Time: 09/13/17  9:07 AM  Result Value Ref Range Status   Specimen Description BLOOD RIGHT HAND  Final   Special Requests IN PEDIATRIC BOTTLE Blood Culture adequate volume  Final   Culture  Setup Time   Final    GRAM POSITIVE COCCI IN CLUSTERS IN PEDIATRIC BOTTLE CRITICAL RESULT CALLED TO, READ BACK BY AND VERIFIED WITHAlona Bene PHARMD [id number] 09/14/17 A BROWNING    Culture STAPHYLOCOCCUS AUREUS REPEATING SENSITIVITIES  (A)  Final   Report Status PENDING  Incomplete  MRSA PCR Screening     Status: None   Collection Time: 09/15/17 10:46 AM  Result Value Ref Range Status   MRSA by PCR NEGATIVE NEGATIVE Final    Comment:          The GeneXpert MRSA Assay (FDA approved for NASAL specimens only), is one component of a comprehensive MRSA colonization surveillance program. It is not intended to diagnose MRSA infection nor to guide or monitor treatment for MRSA infections.   Aerobic/Anaerobic Culture (surgical/deep wound)     Status: None (Preliminary result)   Collection Time: 09/15/17 12:33 PM  Result Value Ref Range Status   Specimen Description ABSCESS PARASPINAL  Final   Special Requests NONE  Final   Gram Stain   Final    RARE WBC PRESENT, PREDOMINANTLY PMN FEW GRAM POSITIVE COCCI IN CLUSTERS    Culture FEW STAPHYLOCOCCUS AUREUS  Final   Report Status PENDING  Incomplete  Aerobic/Anaerobic Culture (surgical/deep wound)     Status: None (Preliminary result)   Collection Time: 09/15/17 12:41 PM  Result Value Ref Range Status   Specimen Description ABSCESS SPINAL EPIDURAL  Final   Special Requests NONE  Final   Gram Stain  Final    RARE WBC PRESENT, PREDOMINANTLY PMN FEW GRAM POSITIVE COCCI IN CLUSTERS    Culture MODERATE STAPHYLOCOCCUS AUREUS  Final   Report Status PENDING  Incomplete    Studies/Results: Dg Cervical Spine 2-3 Views  Result Date: 09/15/2017 CLINICAL DATA:  Posterior cervical fusion EXAM: DG C-ARM 61-120 MIN; CERVICAL SPINE - 2-3 VIEW COMPARISON:  None. FLUOROSCOPY TIME:  Fluoroscopy Time:  5 seconds Radiation Exposure Index (if provided by the fluoroscopic device): Available Number of Acquired Spot Images: 2 FINDINGS: Bilateral posterior cervical fusion is noted extending from C3 to what appears to be C6 on the frontal film on the left and C4-C6 on the right. The inferior aspect of the hardware on the lateral projection is poorly visualized. IMPRESSION: Posterior cervical fusion. Electronically Signed   By: Inez Catalina M.D.   On: 09/15/2017 14:07   Dg C-arm 1-60 Min  Result Date: 09/15/2017 CLINICAL DATA:  Posterior cervical fusion EXAM: DG C-ARM 61-120 MIN; CERVICAL SPINE - 2-3  VIEW COMPARISON:  None. FLUOROSCOPY TIME:  Fluoroscopy Time:  5 seconds Radiation Exposure Index (if provided by the fluoroscopic device): Available Number of Acquired Spot Images: 2 FINDINGS: Bilateral posterior cervical fusion is noted extending from C3 to what appears to be C6 on the frontal film on the left and C4-C6 on the right. The inferior aspect of the hardware on the lateral projection is poorly visualized. IMPRESSION: Posterior cervical fusion. Electronically Signed   By: Inez Catalina M.D.   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  She will need a TEE Afebrile, WBC improved Can /fu treatment for Hep C as an outpt She has court date after d/c.           Bobby Rumpf MD, FACP Infectious Diseases (pager) [phone] [email or web address] 09/16/2017, 1:26 PM  LOS: 5 days

## 2017-09-16 NOTE — Progress Notes (Signed)
Patient ID: [name], female   DOB: 09/15/1966, 51 y.o.   MRN: [id number] Patient overall with condition of neck pain but improved feeling in her arms and hands.  Neurologically stable 4+ out of 5 strength throughout no pronator drift incision clean dry and intact  Continue to mobilize with physical and occupational therapy continue cervical collar. Initial bacteria identified as staphylococcus.

## 2017-09-16 NOTE — Progress Notes (Signed)
[place] TEAM 1 - Stepdown/ICU TEAM  [name]  GYJ:[id number] DOB: 06/26/1966 DOA: 09/11/2017 PCP: Marliss Coots, NP    Brief Narrative:  51 year old female w/ a history of IV drug use and chronic back pain status post previous back surgery who presented to the ED with severe back and neck pain.  An MRI of the T and L-spine revealed a ventral epidural abscess.  Patient was also noted to have MSSA bacteremia.    Patient was placed empirically on IV antibiotics.  ID and neurosurgery consulted.  Patient was subsequently transferred to [facility].  Significant Events: 11/17 admit to [facility]  11/18 transfer to [facility] 11/21 developed urinary retention - emergent C3-C7 laminectomy for decompression of spinal cord and debridement of epidural abscess  Subjective: C/o severe pain in her neck.  Is alert and oriented.  Denies cp, sob, or abdom pain.    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 ID and Neurosurgery directing care of this issue - was to have TEE 11/21 but this was interupted by need for urgent OR decompression - post-op care per NS - will need TEE rescheduled when clear for same per NS   Hypotension Resolved with volume resuscitation and treatment of sepsis  Lymphadenopathy/mediastinal and paravertebral soft tissue masses A consequence of her severe infection   Polysubstance abuse/IV drug use Possessed suspected drugs and definite drug paraphernalia at time of admission - no clear evidence of withdrawal at this time   Hep C+ Will need f/u after d/c in the ID clinic  Morbid Obesity - Body mass index is 41.21 kg/m.   DVT prophylaxis: SCDs Code Status: FULL CODE Family Communication: no family present at time of exam  Disposition Plan: SDU  Consultants:  Heme/Onc ID NS  Antimicrobials:  Nafcillin 11/19 > Rifampin 11/22 > Cefazolin 11/18 > 11/19 Cefepime 09/11/2017 >  09/12/2017 Vancomycin 11/17  Objective: Blood pressure (!) 163/78, pulse 86, temperature (!) 97.1 F (36.2 C), temperature source Oral, resp. rate (!) 21, height 5\' 6"  (1.676 m), weight 115.8 kg (255 lb 4.7 oz), SpO2 94 %.  Intake/Output Summary (Last 24 hours) at 09/16/2017 1558 Last data filed at 09/16/2017 1405 Gross per 24 hour  Intake 5305 ml  Output 1590 ml  Net 3715 ml   Filed Weights   09/12/17 1319 09/13/17 0730 09/14/17 0831  Weight: 115.8 kg (255 lb 4.7 oz) 117.4 kg (258 lb 13.1 oz) 115.8 kg (255 lb 4.7 oz)    Examination: General: No acute respiratory distress  Lungs: CTA B Cardiovascular: RRR Abdomen: NT/ND, soft, overweight, no rebound, no mass  Extremities: trace B LE edema   CBC: Recent Labs  Lab 09/10/17 0245 09/11/17 0732 09/12/17 0312 09/12/17 0910 09/13/17 0347 09/14/17 0401 09/15/17 0557 09/16/17 0431  WBC 18.9* 17.1* 19.2*  --  19.1* 19.0* 21.1* 15.0*  NEUTROABS 16.0* 14.4*  --  15.8* 15.3*  --   --   --   HGB 12.2 12.1 11.5*  --  13.1 12.2 11.7* 10.6*  HCT 34.3* 34.2* 33.8*  --  38.1 37.4 35.3* 31.5*  MCV 88.9 89.3 90.9  --  90.1 90.8 91.7 89.7  PLT 334 383 292  --  312 307 362 263   Basic Metabolic Panel: Recent Labs  Lab 09/10/17 0404 09/11/17 0732 09/12/17 0312 09/12/17 0910 09/13/17 0347 09/14/17 0401 09/15/17 0557 09/16/17 0431  NA  --  131* 137  --  137 137 138 137  K  --  2.9* 3.7  --  3.6 3.3* 3.9 3.8  CL  --  94* 103  --  104 102 101 101  CO2  --  30 26  --  27 28 28 29   GLUCOSE  --  109* 88  --  89 93 77 128*  BUN  --  9 7  --  <5* 6 7 8   CREATININE  --  0.80 0.66  --  0.67 0.71 0.76 0.63  CALCIUM  --  8.3* 7.5*  --  7.7* 7.6* 7.8* 7.4*  MG 1.9 1.8  --  2.0 2.0 1.9  --   --   PHOS  --   --   --   --  3.7 5.0*  --   --    GFR: Estimated Creatinine Clearance: 111.2 mL/min (by C-G formula based on SCr of 0.63 mg/dL).  Liver Function Tests: Recent Labs  Lab 09/10/17 0245 09/12/17 0312 09/13/17 0347 09/14/17 0401  09/15/17 1822 09/16/17 0431  AST 32 31 41 36  --  24  ALT 28 20 19 16 15  12*  ALKPHOS 84 131* 373* 513*  --  621*  BILITOT 1.1 1.4* 2.0* 3.5*  --  3.4*  PROT 6.6 5.3* 5.6* 5.2*  --  5.2*  ALBUMIN 3.0* 2.2* 2.1* 1.9*  --  1.7*    Coagulation Profile: Recent Labs  Lab 09/13/17 0347 09/15/17 0803  INR 1.25 1.19    Cardiac Enzymes: Recent Labs  Lab 09/11/17 0732  CKTOTAL 198    HbA1C: Hgb A1c MFr Bld  Date/Time Value Ref Range Status  04/11/2017 06:20 PM 5.9 (H) 4.8 - 5.6 % Final    Comment:    (NOTE)         Pre-diabetes: 5.7 - 6.4         Diabetes: >6.4         Glycemic control for adults with diabetes: <7.0      Recent Results (from the past 240 hour(s))  Urine culture     Status: Abnormal   Collection Time: 09/10/17 12:57 AM  Result Value Ref Range Status   Specimen Description URINE, CLEAN CATCH  Final   Special Requests NONE  Final   Culture MULTIPLE SPECIES PRESENT, SUGGEST RECOLLECTION (A)  Final   Report Status 09/11/2017 FINAL  Final  Wet prep, genital     Status: Abnormal   Collection Time: 09/10/17  4:20 AM  Result Value Ref Range Status   Yeast Wet Prep HPF POC NONE SEEN NONE SEEN Final   Trich, Wet Prep NONE SEEN NONE SEEN Final   Clue Cells Wet Prep HPF POC NONE SEEN NONE SEEN Final   WBC, Wet Prep HPF POC FEW (A) NONE SEEN Final   Sperm NONE SEEN  Final  Blood culture (routine x 2)     Status: Abnormal   Collection Time: 09/11/17  9:20 AM  Result Value Ref Range Status   Specimen Description BLOOD RIGHT ANTECUBITAL  Final   Special Requests   Final    BOTTLES DRAWN AEROBIC AND ANAEROBIC Blood Culture adequate volume   Culture  Setup Time   Final    GRAM POSITIVE COCCI IN CLUSTERS IN BOTH AEROBIC AND ANAEROBIC BOTTLES CRITICAL RESULT CALLED TO, READ BACK BY AND VERIFIED WITH: Novella Rob [id number] 09/12/2017 Mena Goes Performed at [facility], [address] [place], [place] [address]    Culture STAPHYLOCOCCUS AUREUS (A)  Final  Report Status 09/14/2017 FINAL  Final   Organism ID, Bacteria STAPHYLOCOCCUS AUREUS  Final      Susceptibility   Staphylococcus aureus - MIC*    CIPROFLOXACIN <=0.5 SENSITIVE Sensitive     ERYTHROMYCIN >=8 RESISTANT Resistant     GENTAMICIN <=0.5 SENSITIVE Sensitive     OXACILLIN 0.5 SENSITIVE Sensitive     TETRACYCLINE <=1 SENSITIVE Sensitive     VANCOMYCIN <=0.5 SENSITIVE Sensitive     TRIMETH/SULFA <=10 SENSITIVE Sensitive     CLINDAMYCIN <=0.25 SENSITIVE Sensitive     RIFAMPIN <=0.5 SENSITIVE Sensitive     Inducible Clindamycin NEGATIVE Sensitive     * STAPHYLOCOCCUS AUREUS  Blood Culture ID Panel (Reflexed)     Status: Abnormal   Collection Time: 09/11/17  9:20 AM  Result Value Ref Range Status   Enterococcus species NOT DETECTED NOT DETECTED Final   Listeria monocytogenes NOT DETECTED NOT DETECTED Final   Staphylococcus species DETECTED (A) NOT DETECTED Final    Comment: CRITICAL RESULT CALLED TO, READ BACK BY AND VERIFIED WITH: B. GREEN,PHARMD [id number] 09/12/2017 T. TYSOR    Staphylococcus aureus DETECTED (A) NOT DETECTED Final    Comment: Methicillin (oxacillin) susceptible Staphylococcus aureus (MSSA). Preferred therapy is anti staphylococcal beta lactam antibiotic (Cefazolin or Nafcillin), unless clinically contraindicated. CRITICAL RESULT CALLED TO, READ BACK BY AND VERIFIED WITH: B. GREEN,PHARMD [id number] 09/12/2017 T. TYSOR    Methicillin resistance NOT DETECTED NOT DETECTED Final   Streptococcus species NOT DETECTED NOT DETECTED Final   Streptococcus agalactiae NOT DETECTED NOT DETECTED Final   Streptococcus pneumoniae NOT DETECTED NOT DETECTED Final   Streptococcus pyogenes NOT DETECTED NOT DETECTED Final   Acinetobacter baumannii NOT DETECTED NOT DETECTED Final   Enterobacteriaceae species NOT DETECTED NOT DETECTED Final   Enterobacter cloacae complex NOT DETECTED NOT DETECTED Final   Escherichia coli NOT DETECTED NOT DETECTED Final   Klebsiella oxytoca NOT DETECTED NOT  DETECTED Final   Klebsiella pneumoniae NOT DETECTED NOT DETECTED Final   Proteus species NOT DETECTED NOT DETECTED Final   Serratia marcescens NOT DETECTED NOT DETECTED Final   Haemophilus influenzae NOT DETECTED NOT DETECTED Final   Neisseria meningitidis NOT DETECTED NOT DETECTED Final   Pseudomonas aeruginosa NOT DETECTED NOT DETECTED Final   Candida albicans NOT DETECTED NOT DETECTED Final   Candida glabrata NOT DETECTED NOT DETECTED Final   Candida krusei NOT DETECTED NOT DETECTED Final   Candida parapsilosis NOT DETECTED NOT DETECTED Final   Candida tropicalis NOT DETECTED NOT DETECTED Final    Comment: Performed at [facility], [address] [place], [place] [address]  Blood culture (routine x 2)     Status: Abnormal   Collection Time: 09/11/17  9:33 AM  Result Value Ref Range Status   Specimen Description BLOOD BLOOD LEFT HAND  Final   Special Requests   Final    BOTTLES DRAWN AEROBIC AND ANAEROBIC Blood Culture adequate volume   Culture  Setup Time   Final    GRAM POSITIVE COCCI IN CLUSTERS IN BOTH AEROBIC AND ANAEROBIC BOTTLES CRITICAL VALUE NOTED.  VALUE IS CONSISTENT WITH PREVIOUSLY REPORTED AND CALLED VALUE.    Culture (A)  Final    STAPHYLOCOCCUS AUREUS SUSCEPTIBILITIES PERFORMED ON PREVIOUS CULTURE WITHIN THE LAST 5 DAYS. Performed at [facility], [place] [address] [place], [place] [address]    Report Status 09/14/2017 FINAL  Final  MRSA PCR Screening     Status: None   Collection Time: 09/11/17  1:03 PM  Result  Value Ref Range Status   MRSA by PCR NEGATIVE NEGATIVE Final    Comment:        The GeneXpert MRSA Assay (FDA approved for NASAL specimens only), is one component of a comprehensive MRSA colonization surveillance program. It is not intended to diagnose MRSA infection nor to guide or monitor treatment for MRSA infections.   Culture, blood (Routine X 2) w Reflex to ID Panel     Status: Abnormal   Collection Time: 09/12/17  9:00 AM    Result Value Ref Range Status   Specimen Description BLOOD RIGHT HAND  Final   Special Requests IN PEDIATRIC BOTTLE Blood Culture adequate volume  Final   Culture  Setup Time   Final    GRAM POSITIVE COCCI IN CLUSTERS IN PEDIATRIC BOTTLE CRITICAL RESULT CALLED TO, READ BACK BY AND VERIFIED WITH: K. Caroline Sauger 0938 09/13/2017 T. TYSOR    Culture (A)  Final    STAPHYLOCOCCUS AUREUS SUSCEPTIBILITIES PERFORMED ON PREVIOUS CULTURE WITHIN THE LAST 5 DAYS. Performed at [facility], [place] [address], [place], [place] [address]    Report Status 09/15/2017 FINAL  Final  Culture, blood (Routine X 2) w Reflex to ID Panel     Status: Abnormal   Collection Time: 09/12/17  9:15 AM  Result Value Ref Range Status   Specimen Description BLOOD BLOOD RIGHT HAND  Final   Special Requests IN PEDIATRIC BOTTLE Blood Culture adequate volume  Final   Culture  Setup Time   Final    GRAM POSITIVE COCCI IN CLUSTERS CRITICAL RESULT CALLED TO, READ BACK BY AND VERIFIED WITH: K. Caroline Sauger [id number] 09/13/2017 T. TYSOR    Culture (A)  Final    STAPHYLOCOCCUS AUREUS SUSCEPTIBILITIES PERFORMED ON PREVIOUS CULTURE WITHIN THE LAST 5 DAYS. Performed at [facility], [place] [address], [place], [place] [address]    Report Status 09/15/2017 FINAL  Final  Culture, Urine     Status: None   Collection Time: 09/12/17 11:45 AM  Result Value Ref Range Status   Specimen Description URINE, CLEAN CATCH  Final   Special Requests NONE  Final   Culture   Final    NO GROWTH Performed at [facility], [place] [address] [place], [place] [address]    Report Status 09/13/2017 FINAL  Final  Culture, blood (routine x 2)     Status: Abnormal (Preliminary result)   Collection Time: 09/13/17  9:02 AM  Result Value Ref Range Status   Specimen Description BLOOD LEFT HAND  Final   Special Requests IN PEDIATRIC BOTTLE Blood Culture adequate volume  Final   Culture  Setup Time   Final    GRAM POSITIVE COCCI IN  CLUSTERS IN PEDIATRIC BOTTLE CRITICAL VALUE NOTED.  VALUE IS CONSISTENT WITH PREVIOUSLY REPORTED AND CALLED VALUE.    Culture STAPHYLOCOCCUS AUREUS (A)  Final   Report Status PENDING  Incomplete  Culture, blood (routine x 2)     Status: Abnormal (Preliminary result)   Collection Time: 09/13/17  9:07 AM  Result Value Ref Range Status   Specimen Description BLOOD RIGHT HAND  Final   Special Requests IN PEDIATRIC BOTTLE Blood Culture adequate volume  Final   Culture  Setup Time   Final    GRAM POSITIVE COCCI IN CLUSTERS IN PEDIATRIC BOTTLE CRITICAL RESULT CALLED TO, READ BACK BY AND VERIFIED WITHAlona Bene PHARMD [id number] 09/14/17 A BROWNING    Culture STAPHYLOCOCCUS AUREUS REPEATING SENSITIVITIES  (A)  Final   Report  Status PENDING  Incomplete  MRSA PCR Screening     Status: None   Collection Time: 09/15/17 10:46 AM  Result Value Ref Range Status   MRSA by PCR NEGATIVE NEGATIVE Final    Comment:        The GeneXpert MRSA Assay (FDA approved for NASAL specimens only), is one component of a comprehensive MRSA colonization surveillance program. It is not intended to diagnose MRSA infection nor to guide or monitor treatment for MRSA infections.   Aerobic/Anaerobic Culture (surgical/deep wound)     Status: None (Preliminary result)   Collection Time: 09/15/17 12:33 PM  Result Value Ref Range Status   Specimen Description ABSCESS PARASPINAL  Final   Special Requests NONE  Final   Gram Stain   Final    RARE WBC PRESENT, PREDOMINANTLY PMN FEW GRAM POSITIVE COCCI IN CLUSTERS    Culture FEW STAPHYLOCOCCUS AUREUS  Final   Report Status PENDING  Incomplete  Aerobic/Anaerobic Culture (surgical/deep wound)     Status: None (Preliminary result)   Collection Time: 09/15/17 12:41 PM  Result Value Ref Range Status   Specimen Description ABSCESS SPINAL EPIDURAL  Final   Special Requests NONE  Final   Gram Stain   Final    RARE WBC PRESENT, PREDOMINANTLY PMN FEW GRAM POSITIVE COCCI IN  CLUSTERS    Culture MODERATE STAPHYLOCOCCUS AUREUS  Final   Report Status PENDING  Incomplete     Scheduled Meds: . docusate sodium  100 mg Oral BID  . potassium chloride  40 mEq Oral Once  . rifampin  300 mg Oral Q12H     LOS: 5 days   Cherene Altes, MD [facility] Office  [phone] Pager - Text Page per Amion as per below:  On-Call/Text Page:      [email or web address]      password [email or web address]  If 7PM-7AM, please contact night-coverage [email or web address] Password [email or web address] 09/16/2017, 3:58 PM

## 2017-09-16 NOTE — Progress Notes (Signed)
PT Cancellation Note  Patient Details Name: [name] MRN: [id number] DOB: 07/27/1966   Cancelled Treatment:    Reason Eval/Treat Not Completed: Other (comment). Attempted to see patient 2x this AM. Pt difficult to arouse, snoring. RN stated she was "wild" yesterday and it's probably best to let her sleep. PT to return as able to complete PT eval.   Dory Verdun M Autumm Hattery 09/16/2017, 9:13 AM   Kittie Plater, PT, DPT Pager #: [phone] Office #: [phone]

## 2017-09-17 DIAGNOSIS — B9561 Methicillin susceptible Staphylococcus aureus infection as the cause of diseases classified elsewhere: Secondary | ICD-10-CM

## 2017-09-17 DIAGNOSIS — B1920 Unspecified viral hepatitis C without hepatic coma: Secondary | ICD-10-CM

## 2017-09-17 DIAGNOSIS — G061 Intraspinal abscess and granuloma: Secondary | ICD-10-CM

## 2017-09-17 DIAGNOSIS — Z967 Presence of other bone and tendon implants: Secondary | ICD-10-CM

## 2017-09-17 DIAGNOSIS — K5900 Constipation, unspecified: Secondary | ICD-10-CM

## 2017-09-17 DIAGNOSIS — K5903 Drug induced constipation: Secondary | ICD-10-CM

## 2017-09-17 LAB — CULTURE, BLOOD (ROUTINE X 2)
SPECIAL REQUESTS: ADEQUATE
Special Requests: ADEQUATE

## 2017-09-17 MED ORDER — SODIUM CHLORIDE 0.9% FLUSH
10.0000 mL | Freq: Two times a day (BID) | INTRAVENOUS | Status: DC
  Administered 2017-09-17 – 2017-10-14 (×47): 10 mL
  Administered 2017-10-15: 40 mL
  Administered 2017-10-15: 10 mL
  Administered 2017-10-16: 40 mL
  Administered 2017-10-16: 10 mL
  Administered 2017-10-17: 40 mL
  Administered 2017-10-17 – 2017-10-18 (×2): 10 mL
  Administered 2017-10-18 – 2017-10-19 (×2): 40 mL
  Administered 2017-10-19 – 2017-10-22 (×6): 10 mL
  Administered 2017-10-22: 40 mL
  Administered 2017-10-23 – 2017-10-29 (×13): 10 mL
  Administered 2017-10-29: 40 mL
  Administered 2017-10-30 – 2017-11-14 (×29): 10 mL

## 2017-09-17 MED ORDER — HYDROXYZINE HCL 25 MG PO TABS
25.0000 mg | ORAL_TABLET | Freq: Four times a day (QID) | ORAL | Status: DC | PRN
  Administered 2017-09-17 – 2017-11-12 (×63): 25 mg via ORAL
  Filled 2017-09-17 (×65): qty 1

## 2017-09-17 MED ORDER — METHOCARBAMOL 500 MG PO TABS
500.0000 mg | ORAL_TABLET | Freq: Three times a day (TID) | ORAL | Status: DC | PRN
  Administered 2017-09-17 – 2017-09-22 (×14): 500 mg via ORAL
  Filled 2017-09-17 (×14): qty 1

## 2017-09-17 MED ORDER — METHOCARBAMOL 500 MG PO TABS: 500.0000 mg | ORAL_TABLET | Freq: Three times a day (TID) | ORAL | Status: DC | PRN

## 2017-09-17 MED ORDER — SODIUM CHLORIDE 0.9% FLUSH
10.0000 mL | INTRAVENOUS | Status: DC | PRN
  Administered 2017-09-23: 10 mL
  Filled 2017-09-17: qty 40

## 2017-09-17 MED ORDER — CHLORHEXIDINE GLUCONATE CLOTH 2 % EX PADS
6.0000 | MEDICATED_PAD | Freq: Every day | CUTANEOUS | Status: DC
  Administered 2017-09-17 – 2017-11-04 (×49): 6 via TOPICAL

## 2017-09-17 MED ORDER — HYDROXYZINE HCL 25 MG PO TABS: 25.0000 mg | ORAL_TABLET | Freq: Four times a day (QID) | ORAL | Status: DC | PRN

## 2017-09-17 NOTE — Progress Notes (Signed)
[place] TEAM 1 - Stepdown/ICU TEAM  MAELYNN MORONEY  YBW:[id number] DOB: 31-May-1966 DOA: 09/11/2017 PCP: Marliss Coots, NP    Brief Narrative:  51 year old female w/ a history of IV drug use and chronic back pain status post previous back surgery who presented to the ED with severe back and neck pain.  An MRI of the T and L-spine revealed a ventral epidural abscess.  Patient was also noted to have MSSA bacteremia.    Patient was placed empirically on IV antibiotics.  ID and neurosurgery consulted.  Patient was subsequently transferred to [facility]  Significant Events: 11/17 admit to [facility]  11/18 transfer to [facility] 11/21 developed urinary retention - emergent C3-C7 laminectomy for decompression of spinal cord and debridement of epidural abscess  Subjective: Resting comfortably at the time of my visit.    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 ID and Neurosurgery directing care of this issue - was to have TEE 11/21 but this was interrupted by need for urgent OR decompression - post-op care per NS - will need TEE rescheduled when clear for same per NS   Hypotension Resolved with volume resuscitation and treatment of sepsis  Lymphadenopathy / mediastinal and paravertebral soft tissue masses A consequence of her severe infection   Polysubstance abuse/IV drug use Possessed suspected drugs and definite drug paraphernalia at time of admission - no clear evidence of withdrawal at this time   Hep C+ Will need f/u after d/c in the ID clinic  Morbid Obesity - Body mass index is 41.21 kg/m.   DVT prophylaxis: SCDs Code Status: FULL CODE Family Communication: no family present at time of exam  Disposition Plan: SDU  Consultants:  Heme/Onc ID NS  Antimicrobials:  Nafcillin 11/19 > Rifampin 11/22 > Cefazolin 11/18 > 11/19 Cefepime 09/11/2017 > 09/12/2017 Vancomycin  11/17  Objective: Blood pressure (!) 150/83, pulse 90, temperature 98.6 F (37 C), temperature source Oral, resp. rate 20, height 5\' 6"  (1.676 m), weight 115.8 kg (255 lb 4.7 oz), SpO2 96 %.  Intake/Output Summary (Last 24 hours) at 09/17/2017 1627 Last data filed at 09/17/2017 1522 Gross per 24 hour  Intake 2170 ml  Output 2900 ml  Net -730 ml   Filed Weights   09/12/17 1319 09/13/17 0730 09/14/17 0831  Weight: 115.8 kg (255 lb 4.7 oz) 117.4 kg (258 lb 13.1 oz) 115.8 kg (255 lb 4.7 oz)    Examination: General: No acute respiratory distress  Lungs: CTA B Cardiovascular: RRR  CBC: Recent Labs  Lab 09/11/17 0732 09/12/17 0312 09/12/17 0910 09/13/17 0347 09/14/17 0401 09/15/17 0557 09/16/17 0431  WBC 17.1* 19.2*  --  19.1* 19.0* 21.1* 15.0*  NEUTROABS 14.4*  --  15.8* 15.3*  --   --   --   HGB 12.1 11.5*  --  13.1 12.2 11.7* 10.6*  HCT 34.2* 33.8*  --  38.1 37.4 35.3* 31.5*  MCV 89.3 90.9  --  90.1 90.8 91.7 89.7  PLT 383 292  --  312 307 362 768   Basic Metabolic Panel: Recent Labs  Lab 09/11/17 0732 09/12/17 0312 09/12/17 0910 09/13/17 0347 09/14/17 0401 09/15/17 0557 09/16/17 0431  NA 131* 137  --  137 137 138 137  K 2.9* 3.7  --  3.6 3.3* 3.9 3.8  CL 94* 103  --  104 102 101 101  CO2 30 26  --  27 28 28  29  GLUCOSE 109* 88  --  89 93 77 128*  BUN 9 7  --  <5* 6 7 8   CREATININE 0.80 0.66  --  0.67 0.71 0.76 0.63  CALCIUM 8.3* 7.5*  --  7.7* 7.6* 7.8* 7.4*  MG 1.8  --  2.0 2.0 1.9  --   --   PHOS  --   --   --  3.7 5.0*  --   --    GFR: Estimated Creatinine Clearance: 111.2 mL/min (by C-G formula based on SCr of 0.63 mg/dL).  Liver Function Tests: Recent Labs  Lab 09/12/17 0312 09/13/17 0347 09/14/17 0401 09/15/17 1822 09/16/17 0431  AST 31 41 36  --  24  ALT 20 19 16 15  12*  ALKPHOS 131* 373* 513*  --  621*  BILITOT 1.4* 2.0* 3.5*  --  3.4*  PROT 5.3* 5.6* 5.2*  --  5.2*  ALBUMIN 2.2* 2.1* 1.9*  --  1.7*    Coagulation Profile: Recent  Labs  Lab 09/13/17 0347 09/15/17 0803  INR 1.25 1.19    Cardiac Enzymes: Recent Labs  Lab 09/11/17 0732  CKTOTAL 198    HbA1C: Hgb A1c MFr Bld  Date/Time Value Ref Range Status  04/11/2017 06:20 PM 5.9 (H) 4.8 - 5.6 % Final    Comment:    (NOTE)         Pre-diabetes: 5.7 - 6.4         Diabetes: >6.4         Glycemic control for adults with diabetes: <7.0      Recent Results (from the past 240 hour(s))  Urine culture     Status: Abnormal   Collection Time: 09/10/17 12:57 AM  Result Value Ref Range Status   Specimen Description URINE, CLEAN CATCH  Final   Special Requests NONE  Final   Culture MULTIPLE SPECIES PRESENT, SUGGEST RECOLLECTION (A)  Final   Report Status 09/11/2017 FINAL  Final  Wet prep, genital     Status: Abnormal   Collection Time: 09/10/17  4:20 AM  Result Value Ref Range Status   Yeast Wet Prep HPF POC NONE SEEN NONE SEEN Final   Trich, Wet Prep NONE SEEN NONE SEEN Final   Clue Cells Wet Prep HPF POC NONE SEEN NONE SEEN Final   WBC, Wet Prep HPF POC FEW (A) NONE SEEN Final   Sperm NONE SEEN  Final  Blood culture (routine x 2)     Status: Abnormal   Collection Time: 09/11/17  9:20 AM  Result Value Ref Range Status   Specimen Description BLOOD RIGHT ANTECUBITAL  Final   Special Requests   Final    BOTTLES DRAWN AEROBIC AND ANAEROBIC Blood Culture adequate volume   Culture  Setup Time   Final    GRAM POSITIVE COCCI IN CLUSTERS IN BOTH AEROBIC AND ANAEROBIC BOTTLES CRITICAL RESULT CALLED TO, READ BACK BY AND VERIFIED WITH: Novella Rob [id number] 09/12/2017 Mena Goes Performed at [facility], [address], [place], [place] [address]    Culture STAPHYLOCOCCUS AUREUS (A)  Final   Report Status 09/14/2017 FINAL  Final   Organism ID, Bacteria STAPHYLOCOCCUS AUREUS  Final      Susceptibility   Staphylococcus aureus - MIC*    CIPROFLOXACIN <=0.5 SENSITIVE Sensitive     ERYTHROMYCIN >=8 RESISTANT Resistant     GENTAMICIN <=0.5 SENSITIVE  Sensitive     OXACILLIN 0.5 SENSITIVE Sensitive     TETRACYCLINE <=1 SENSITIVE Sensitive  VANCOMYCIN <=0.5 SENSITIVE Sensitive     TRIMETH/SULFA <=10 SENSITIVE Sensitive     CLINDAMYCIN <=0.25 SENSITIVE Sensitive     RIFAMPIN <=0.5 SENSITIVE Sensitive     Inducible Clindamycin NEGATIVE Sensitive     * STAPHYLOCOCCUS AUREUS  Blood Culture ID Panel (Reflexed)     Status: Abnormal   Collection Time: 09/11/17  9:20 AM  Result Value Ref Range Status   Enterococcus species NOT DETECTED NOT DETECTED Final   Listeria monocytogenes NOT DETECTED NOT DETECTED Final   Staphylococcus species DETECTED (A) NOT DETECTED Final    Comment: CRITICAL RESULT CALLED TO, READ BACK BY AND VERIFIED WITH: B. GREEN,PHARMD [id number] 09/12/2017 T. TYSOR    Staphylococcus aureus DETECTED (A) NOT DETECTED Final    Comment: Methicillin (oxacillin) susceptible Staphylococcus aureus (MSSA). Preferred therapy is anti staphylococcal beta lactam antibiotic (Cefazolin or Nafcillin), unless clinically contraindicated. CRITICAL RESULT CALLED TO, READ BACK BY AND VERIFIED WITH: B. GREEN,PHARMD 0086 09/12/2017 T. TYSOR    Methicillin resistance NOT DETECTED NOT DETECTED Final   Streptococcus species NOT DETECTED NOT DETECTED Final   Streptococcus agalactiae NOT DETECTED NOT DETECTED Final   Streptococcus pneumoniae NOT DETECTED NOT DETECTED Final   Streptococcus pyogenes NOT DETECTED NOT DETECTED Final   Acinetobacter baumannii NOT DETECTED NOT DETECTED Final   Enterobacteriaceae species NOT DETECTED NOT DETECTED Final   Enterobacter cloacae complex NOT DETECTED NOT DETECTED Final   Escherichia coli NOT DETECTED NOT DETECTED Final   Klebsiella oxytoca NOT DETECTED NOT DETECTED Final   Klebsiella pneumoniae NOT DETECTED NOT DETECTED Final   Proteus species NOT DETECTED NOT DETECTED Final   Serratia marcescens NOT DETECTED NOT DETECTED Final   Haemophilus influenzae NOT DETECTED NOT DETECTED Final   Neisseria meningitidis  NOT DETECTED NOT DETECTED Final   Pseudomonas aeruginosa NOT DETECTED NOT DETECTED Final   Candida albicans NOT DETECTED NOT DETECTED Final   Candida glabrata NOT DETECTED NOT DETECTED Final   Candida krusei NOT DETECTED NOT DETECTED Final   Candida parapsilosis NOT DETECTED NOT DETECTED Final   Candida tropicalis NOT DETECTED NOT DETECTED Final    Comment: Performed at [facility], [address], [place], [place] [address]  Blood culture (routine x 2)     Status: Abnormal   Collection Time: 09/11/17  9:33 AM  Result Value Ref Range Status   Specimen Description BLOOD BLOOD LEFT HAND  Final   Special Requests   Final    BOTTLES DRAWN AEROBIC AND ANAEROBIC Blood Culture adequate volume   Culture  Setup Time   Final    GRAM POSITIVE COCCI IN CLUSTERS IN BOTH AEROBIC AND ANAEROBIC BOTTLES CRITICAL VALUE NOTED.  VALUE IS CONSISTENT WITH PREVIOUSLY REPORTED AND CALLED VALUE.    Culture (A)  Final    STAPHYLOCOCCUS AUREUS SUSCEPTIBILITIES PERFORMED ON PREVIOUS CULTURE WITHIN THE LAST 5 DAYS. Performed at [facility], [place] [address] [place], [place] [address]    Report Status 09/14/2017 FINAL  Final  MRSA PCR Screening     Status: None   Collection Time: 09/11/17  1:03 PM  Result Value Ref Range Status   MRSA by PCR NEGATIVE NEGATIVE Final    Comment:        The GeneXpert MRSA Assay (FDA approved for NASAL specimens only), is one component of a comprehensive MRSA colonization surveillance program. It is not intended to diagnose MRSA infection nor to guide or monitor treatment for MRSA infections.   Culture, blood (Routine X 2) w Reflex to  ID Panel     Status: Abnormal   Collection Time: 09/12/17  9:00 AM  Result Value Ref Range Status   Specimen Description BLOOD RIGHT HAND  Final   Special Requests IN PEDIATRIC BOTTLE Blood Culture adequate volume  Final   Culture  Setup Time   Final    GRAM POSITIVE COCCI IN CLUSTERS IN PEDIATRIC BOTTLE CRITICAL RESULT  CALLED TO, READ BACK BY AND VERIFIED WITH: K. WEIGLE,PHARMD [id number] 09/13/2017 T. TYSOR    Culture (A)  Final    STAPHYLOCOCCUS AUREUS SUSCEPTIBILITIES PERFORMED ON PREVIOUS CULTURE WITHIN THE LAST 5 DAYS. Performed at [facility], [place] [address] [place], [place] [address]    Report Status 09/15/2017 FINAL  Final  Culture, blood (Routine X 2) w Reflex to ID Panel     Status: Abnormal   Collection Time: 09/12/17  9:15 AM  Result Value Ref Range Status   Specimen Description BLOOD BLOOD RIGHT HAND  Final   Special Requests IN PEDIATRIC BOTTLE Blood Culture adequate volume  Final   Culture  Setup Time   Final    GRAM POSITIVE COCCI IN CLUSTERS CRITICAL RESULT CALLED TO, READ BACK BY AND VERIFIED WITH: K. Caroline Sauger [id number] 09/13/2017 T. TYSOR    Culture (A)  Final    STAPHYLOCOCCUS AUREUS SUSCEPTIBILITIES PERFORMED ON PREVIOUS CULTURE WITHIN THE LAST 5 DAYS. Performed at [facility], [place] [address] [place], [place] [address]    Report Status 09/15/2017 FINAL  Final  Culture, Urine     Status: None   Collection Time: 09/12/17 11:45 AM  Result Value Ref Range Status   Specimen Description URINE, CLEAN CATCH  Final   Special Requests NONE  Final   Culture   Final    NO GROWTH Performed at [facility], [place] [address], [place], [place] [address]    Report Status 09/13/2017 FINAL  Final  Culture, blood (routine x 2)     Status: Abnormal   Collection Time: 09/13/17  9:02 AM  Result Value Ref Range Status   Specimen Description BLOOD LEFT HAND  Final   Special Requests IN PEDIATRIC BOTTLE Blood Culture adequate volume  Final   Culture  Setup Time   Final    GRAM POSITIVE COCCI IN CLUSTERS IN PEDIATRIC BOTTLE CRITICAL VALUE NOTED.  VALUE IS CONSISTENT WITH PREVIOUSLY REPORTED AND CALLED VALUE.    Culture (A)  Final    STAPHYLOCOCCUS AUREUS SUSCEPTIBILITIES PERFORMED ON PREVIOUS CULTURE WITHIN THE LAST 5 DAYS.    Report Status 09/17/2017 FINAL  Final  Culture,  blood (routine x 2)     Status: Abnormal   Collection Time: 09/13/17  9:07 AM  Result Value Ref Range Status   Specimen Description BLOOD RIGHT HAND  Final   Special Requests IN PEDIATRIC BOTTLE Blood Culture adequate volume  Final   Culture  Setup Time   Final    GRAM POSITIVE COCCI IN CLUSTERS IN PEDIATRIC BOTTLE CRITICAL RESULT CALLED TO, READ BACK BY AND VERIFIED WITHAlona Bene PHARMD [id number] 09/14/17 A BROWNING    Culture STAPHYLOCOCCUS AUREUS (A)  Final   Report Status 09/17/2017 FINAL  Final   Organism ID, Bacteria STAPHYLOCOCCUS AUREUS  Final      Susceptibility   Staphylococcus aureus - MIC*    CIPROFLOXACIN <=0.5 SENSITIVE Sensitive     ERYTHROMYCIN >=8 RESISTANT Resistant     GENTAMICIN <=0.5 SENSITIVE Sensitive     OXACILLIN 0.5 SENSITIVE Sensitive     TETRACYCLINE <=1  SENSITIVE Sensitive     VANCOMYCIN 1 SENSITIVE Sensitive     TRIMETH/SULFA <=10 SENSITIVE Sensitive     CLINDAMYCIN <=0.25 SENSITIVE Sensitive     RIFAMPIN <=0.5 SENSITIVE Sensitive     Inducible Clindamycin NEGATIVE Sensitive     * STAPHYLOCOCCUS AUREUS  MRSA PCR Screening     Status: None   Collection Time: 09/15/17 10:46 AM  Result Value Ref Range Status   MRSA by PCR NEGATIVE NEGATIVE Final    Comment:        The GeneXpert MRSA Assay (FDA approved for NASAL specimens only), is one component of a comprehensive MRSA colonization surveillance program. It is not intended to diagnose MRSA infection nor to guide or monitor treatment for MRSA infections.   Aerobic/Anaerobic Culture (surgical/deep wound)     Status: None (Preliminary result)   Collection Time: 09/15/17 12:33 PM  Result Value Ref Range Status   Specimen Description ABSCESS PARASPINAL  Final   Special Requests NONE  Final   Gram Stain   Final    RARE WBC PRESENT, PREDOMINANTLY PMN FEW GRAM POSITIVE COCCI IN CLUSTERS    Culture   Final    FEW STAPHYLOCOCCUS AUREUS NO ANAEROBES ISOLATED; CULTURE IN PROGRESS FOR 5 DAYS    Report  Status PENDING  Incomplete   Organism ID, Bacteria STAPHYLOCOCCUS AUREUS  Final      Susceptibility   Staphylococcus aureus - MIC*    CIPROFLOXACIN <=0.5 SENSITIVE Sensitive     ERYTHROMYCIN >=8 RESISTANT Resistant     GENTAMICIN <=0.5 SENSITIVE Sensitive     OXACILLIN <=0.25 SENSITIVE Sensitive     TETRACYCLINE <=1 SENSITIVE Sensitive     VANCOMYCIN <=0.5 SENSITIVE Sensitive     TRIMETH/SULFA <=10 SENSITIVE Sensitive     CLINDAMYCIN <=0.25 SENSITIVE Sensitive     RIFAMPIN <=0.5 SENSITIVE Sensitive     Inducible Clindamycin NEGATIVE Sensitive     * FEW STAPHYLOCOCCUS AUREUS  Aerobic/Anaerobic Culture (surgical/deep wound)     Status: None (Preliminary result)   Collection Time: 09/15/17 12:41 PM  Result Value Ref Range Status   Specimen Description ABSCESS SPINAL EPIDURAL  Final   Special Requests NONE  Final   Gram Stain   Final    RARE WBC PRESENT, PREDOMINANTLY PMN FEW GRAM POSITIVE COCCI IN CLUSTERS    Culture   Final    MODERATE STAPHYLOCOCCUS AUREUS NO ANAEROBES ISOLATED; CULTURE IN PROGRESS FOR 5 DAYS    Report Status PENDING  Incomplete   Organism ID, Bacteria STAPHYLOCOCCUS AUREUS  Final      Susceptibility   Staphylococcus aureus - MIC*    CIPROFLOXACIN <=0.5 SENSITIVE Sensitive     ERYTHROMYCIN >=8 RESISTANT Resistant     GENTAMICIN <=0.5 SENSITIVE Sensitive     OXACILLIN 0.5 SENSITIVE Sensitive     TETRACYCLINE <=1 SENSITIVE Sensitive     VANCOMYCIN 1 SENSITIVE Sensitive     TRIMETH/SULFA <=10 SENSITIVE Sensitive     CLINDAMYCIN <=0.25 SENSITIVE Sensitive     RIFAMPIN <=0.5 SENSITIVE Sensitive     Inducible Clindamycin NEGATIVE Sensitive     * MODERATE STAPHYLOCOCCUS AUREUS     Scheduled Meds: . docusate sodium  100 mg Oral BID  . polyethylene glycol  17 g Oral Daily  . potassium chloride  40 mEq Oral Once  . rifampin  300 mg Oral Q12H  . sodium chloride flush  10-40 mL Intracatheter Q12H     LOS: 6 days   Cherene Altes, MD [facility] Office  [phone]  Pager - Text Page per Shea Evans as per below:  On-Call/Text Page:      [email or web address]      password [email or web address]  If 7PM-7AM, please contact night-coverage [email or web address] Password [email or web address] 09/17/2017, 4:27 PM

## 2017-09-17 NOTE — Evaluation (Signed)
Physical Therapy Evaluation Patient Details Name: NKECHI LINEHAN MRN: [id number] DOB: 08-Jul-1966 Today's Date: 09/17/2017   History of Present Illness  51 year old female w/ a history of IV drug use, hep C and chronic back pain status post previous back surgery who presented to the ED with severe back and neck pain.  An MRI of the T and L-spine revealed a ventral epidural abscess C2-t8. 1/21 Pt with emergent C3-7 lami and epidural abscess debridement  Clinical Impression  Pt presents with neck pain, impaired mobility, generalized muscular weakness, and decreased balance secondary to above. Pt tolerated ambulation today, however complains of neck pain throughout. Pt was educated on neck precautions and neck brace. Pt indicates that she will live with sister upon leaving and unsure of home environment. Recommending home health PT in order to assess safe home environment and follow up with neck pain. Pt is a good candidate for skilled PT acutely in order to improve upon aforementioned deficits and increase level of independence while in hospital .     Follow Up Recommendations Home health PT;Supervision for mobility/OOB    Equipment Recommendations  Rolling walker with 5" wheels    Recommendations for Other Services       Precautions / Restrictions Precautions Precautions: Cervical;Fall Required Braces or Orthoses: Cervical Brace Cervical Brace: Hard collar;At all times Restrictions Weight Bearing Restrictions: No      Mobility  Bed Mobility Overal bed mobility: Needs Assistance Bed Mobility: Rolling;Sidelying to Sit Rolling: Min guard Sidelying to sit: Min assist          Transfers Overall transfer level: Needs assistance Equipment used: Rolling walker (2 wheeled) Transfers: Sit to/from Stand Sit to Stand: Min guard         General transfer comment: sit-to-stand x1 from EOB. Pt demonstrates controlled stand-to-sit into chair post-ambulation.    Ambulation/Gait Ambulation/Gait assistance: Min guard Ambulation Distance (Feet): 125 Feet Assistive device: Rolling walker (2 wheeled) Gait Pattern/deviations: Step-to pattern;Trunk flexed;Decreased stride length;Decreased step length - left Gait velocity: decreased   General Gait Details: Pt ambulates with head down towards floor and required VC to look straight ahead. Pt complains of dizziness when adjusting head position. Pt also reports that her left knee has been hurting for a few days. Upon verbal cueing, pt able to demonstrate step-through pattern.  Stairs            Wheelchair Mobility    Modified Rankin (Stroke Patients Only)       Balance Overall balance assessment: Needs assistance Sitting-balance support: Feet supported;No upper extremity supported Sitting balance-Leahy Scale: Fair Sitting balance - Comments: Pt able to sit EOB without UE support. Pt uses [place] freely to eat breakfast while seated in chair with feet supported.    Standing balance support: Bilateral upper extremity supported;During functional activity Standing balance-Leahy Scale: Poor Standing balance comment: Pt uses RW for UE support while standing.                             Pertinent Vitals/Pain Pain Assessment: 0-10 Pain Score: 10-Worst pain ever Pain Location: neck Pain Descriptors / Indicators: Moaning Pain Intervention(s): Limited activity within patient's tolerance;Monitored during session;Premedicated before session    [place] expects to be discharged to:: Other (Comment)(pt will live with sister while healing, then back to boarding house) Living Arrangements: Other relatives(sister)               Additional Comments: Pt will live with sister (  available 24-7) upon discharge from hospital. Pt unsure of sister's home set-up.    Prior Function Level of Independence: Independent               Hand Dominance        Extremity/Trunk  Assessment   Upper Extremity Assessment Upper Extremity Assessment: Defer to OT evaluation    Lower Extremity Assessment Lower Extremity Assessment: Generalized weakness    Cervical / Trunk Assessment Cervical / Trunk Assessment: Normal  Communication   Communication: No difficulties  Cognition Arousal/Alertness: Awake/alert Behavior During Therapy: WFL for tasks assessed/performed Overall Cognitive Status: Within Functional Limits for tasks assessed                                        General Comments General comments (skin integrity, edema, etc.): Pt concentrated on neck pain throughout session.     Exercises     Assessment/Plan    PT Assessment Patient needs continued PT services  PT Problem List Decreased strength;Decreased mobility;Decreased balance;Pain       PT Treatment Interventions DME instruction;Therapeutic activities;Gait training;Therapeutic exercise;Patient/family education;Stair training;Balance training;Functional mobility training;Neuromuscular re-education    PT Goals (Current goals can be found in the Care Plan section)  Acute Rehab PT Goals Patient Stated Goal: get better and reduce pain PT Goal Formulation: With patient Time For Goal Achievement: 10/01/17 Potential to Achieve Goals: Good    Frequency Min 5X/week   Barriers to discharge        Co-evaluation               AM-PAC PT "6 Clicks" Daily Activity  Outcome Measure Difficulty turning over in bed (including adjusting bedclothes, sheets and blankets)?: None Difficulty moving from lying on back to sitting on the side of the bed? : Unable Difficulty sitting down on and standing up from a chair with arms (e.g., wheelchair, bedside commode, etc,.)?: A Little Help needed moving to and from a bed to chair (including a wheelchair)?: A Little Help needed walking in hospital room?: A Little Help needed climbing 3-5 steps with a railing? : A Little 6 Click Score:  17    End of Session Equipment Utilized During Treatment: Gait belt;Cervical collar Activity Tolerance: Patient tolerated treatment well;Patient limited by pain Patient left: with call bell/phone within reach;in chair Nurse Communication: Mobility status PT Visit Diagnosis: Other abnormalities of gait and mobility (R26.89);Muscle weakness (generalized) (M62.81);Difficulty in walking, not elsewhere classified (R26.2);Pain Pain - part of body: (neck)    Time: 4174-0814 PT Time Calculation (min) (ACUTE ONLY): 25 min   Charges:   PT Evaluation $PT Eval Moderate Complexity: 1 Mod PT Treatments $Gait Training: 8-22 mins   PT G Codes:        Judee Clara, SPT  Judee Clara 09/17/2017, 8:56 AM

## 2017-09-17 NOTE — Progress Notes (Signed)
OT Evaluation  PTA, pt independent with mobility and ADL. Pt currently requires min guard A with mobility and Mod A with ADL. Will follow acutely to maximize independence with ADL and mobility with compensatory techniques and use of AE. Pt living in a boarding house PTA but plans to stay with her sister after DC. Will follow acutely to facilitate safe DC home @ a modified independent level.    09/17/17 1700  OT Visit Information  Last OT Received On 09/17/17  Assistance Needed +1  History of Present Illness 51 year old female w/ a history of IV drug use, hep C and chronic back pain status post previous back surgery who presented to the ED with severe back and neck pain.  An MRI of the T and L-spine revealed a ventral epidural abscess C2-t8. 1/21 Pt with emergent C3-7 lami and epidural abscess debridement  Precautions  Precautions Cervical;Fall  Required Braces or Orthoses Cervical Brace  Cervical Brace Hard collar;At all times (orders state  off for bathing/dresisng/when in bed)  Restrictions  Weight Bearing Restrictions No  Home Living  Family/patient expects to be discharged to: Other (Comment) (pt will live with sister while healing, then back to boarding house)  Living Arrangements Other relatives (sister)  Additional Comments Pt will live with sister (available 24-7) upon discharge from hospital. Pt unsure of sister's home set-up.  Prior Function  Level of Independence Independent  Comments Released from prison 2 wks PTA  Communication  Communication No difficulties  Pain Assessment  Pain Assessment Faces  Faces Pain Scale 6  Pain Location neck  Pain Descriptors / Indicators Discomfort  Pain Intervention(s) Limited activity within patient's tolerance;Ice applied (upper traps)  Cognition  Arousal/Alertness Awake/alert  Behavior During Therapy Flat affect  Overall Cognitive Status Within Functional Limits for tasks assessed  Upper Extremity Assessment  Upper Extremity  Assessment Generalized weakness  Lower Extremity Assessment  Lower Extremity Assessment Defer to PT evaluation  Cervical / Trunk Assessment  Cervical / Trunk Assessment Other exceptions (cervical surgery)  ADL  Overall ADL's  Needs assistance/impaired  Eating/Feeding Set up;Sitting  Eating/Feeding Details (indicate cue type and reason) pt states "it hurts"  Grooming Moderate assistance;Sitting  Upper Body Bathing Minimal assistance;Sitting  Lower Body Bathing Moderate assistance;Sit to/from stand  Lower Body Bathing Details (indicate cue type and reason) unable to cross feet over knees at this time  Upper Body Dressing  Moderate assistance;Sitting  Lower Body Dressing Moderate assistance;Sit to/from Environmental education officer guard;Ambulation  Toileting- Clothing Manipulation and Hygiene Moderate assistance;Sit to/from stand  Functional mobility during ADLs Min guard  General ADL Comments will benefit form use of AE  Bed Mobility  Overal bed mobility Needs Assistance  Bed Mobility Rolling;Sidelying to Sit  Rolling Min guard  Sidelying to sit Min assist  Transfers  Overall transfer level Needs assistance  Transfers Sit to/from Stand  Sit to Stand Min guard  Balance  Overall balance assessment Needs assistance  Sitting-balance support Feet supported;No upper extremity supported  Sitting balance-Leahy Scale Good  Sitting balance - Comments Pt able to sit EOB without UE support. Pt uses [place] freely to eat breakfast while seated in chair with feet supported.   Standing balance-Leahy Scale Fair  OT - End of Session  Equipment Utilized During Treatment Cervical collar  Activity Tolerance Patient tolerated treatment well  Patient left in bed;with call bell/phone within reach;with bed alarm set  Nurse Communication Precautions;Mobility status;Other (comment) (need to clarify brace orders)  OT Assessment  OT Recommendation/Assessment  Patient needs continued OT Services  OT Visit  Diagnosis Unsteadiness on feet (R26.81);Muscle weakness (generalized) (M62.81);Pain  Pain - part of body (neck)  OT Problem List Decreased strength;Decreased range of motion;Decreased coordination;Decreased knowledge of use of DME or AE;Decreased knowledge of precautions;Obesity;Impaired UE functional use;Pain  OT Plan  OT Frequency (ACUTE ONLY) Min 3X/week  OT Treatment/Interventions (ACUTE ONLY) Self-care/ADL training;Therapeutic exercise;Neuromuscular education;DME and/or AE instruction;Therapeutic activities;Patient/family education  AM-PAC OT "6 Clicks" Daily Activity Outcome Measure  Help from another person eating meals? 3  Help from another person taking care of personal grooming? 2  Help from another person toileting, which includes using toliet, bedpan, or urinal? 3  Help from another person bathing (including washing, rinsing, drying)? 2  Help from another person to put on and taking off regular upper body clothing? 2  Help from another person to put on and taking off regular lower body clothing? 2  6 Click Score 14  ADL G Code Conversion CK  OT Recommendation  Follow Up Recommendations DC plan and follow up therapy as arranged by surgeon;Supervision - Intermittent  OT Equipment 3 in 1 bedside commode;Other (comment) (AE)  Individuals Consulted  Consulted and Agree with Results and Recommendations Patient  Acute Rehab OT Goals  Patient Stated Goal get better and reduce pain  OT Goal Formulation With patient  Time For Goal Achievement 10/01/17  Potential to Achieve Goals Good  OT Time Calculation  OT Start Time (ACUTE ONLY) 1650  OT Stop Time (ACUTE ONLY) 1706  OT Time Calculation (min) 16 min  OT General Charges  $OT Visit 1 Visit  OT Evaluation  $OT Eval Moderate Complexity 1 Mod  Written Expression  Dominant Hand Right  [facility], OT/L  [phone] 09/17/2017

## 2017-09-17 NOTE — Progress Notes (Signed)
Patient ID: [name], female   DOB: 23-Mar-1966, 51 y.o.   MRN: [id number] Patient doing well condition of pain but improved strength and numbness in her arms and hands.  Strength 4+ out of 5 all 4 extremities incision clean dry and intact  Mobilize with physical and occupational therapy , continue IV antibiotics. When Hemovac drain is putting out less than 30 mL over shift will be able to discontinue it.

## 2017-09-17 NOTE — Progress Notes (Signed)
INFECTIOUS DISEASE PROGRESS NOTE  ID: [name] is a 51 y.o. 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  Constipation.   Abtx:  Anti-infectives (From admission, onward)   Start     Dose/Rate Route Frequency Ordered Stop   09/16/17 2300  nafcillin 2 g in dextrose 5 % 100 mL IVPB  Status:  Discontinued     2 g 200 mL/hr over 30 Minutes Intravenous Every 4 hours 09/15/17 2035 09/15/17 2303   09/16/17 1400  rifampin (RIFADIN) capsule 300 mg     300 mg Oral Every 12 hours 09/16/17 1344     09/15/17 2315  nafcillin 2 g in dextrose 5 % 100 mL IVPB     2 g 200 mL/hr over 30 Minutes Intravenous Every 4 hours 09/15/17 2303     09/15/17 1237  bacitracin 50,000 Units in sodium chloride irrigation 0.9 % 500 mL irrigation  Status:  Discontinued       As needed 09/15/17 1237 09/15/17 1433   09/13/17 1200  nafcillin 2 g in dextrose 5 % 100 mL IVPB  Status:  Discontinued     2 g 200 mL/hr over 30 Minutes Intravenous Every 4 hours 09/13/17 0918 09/15/17 2035   09/12/17 1400  ceFAZolin (ANCEF) IVPB 2g/100 mL premix  Status:  Discontinued     2 g 200 mL/hr over 30 Minutes Intravenous Every 8 hours 09/12/17 0848 09/13/17 0918   09/12/17 1000  vancomycin (VANCOCIN) 1,250 mg in sodium chloride 0.9 % 250 mL IVPB  Status:  Discontinued     1,250 mg 166.7 mL/hr over 90 Minutes Intravenous Every 12 hours 09/11/17 1904 09/12/17 0828   09/12/17 0600  ceFEPIme (MAXIPIME) 1 g in dextrose 5 % 50 mL IVPB  Status:  Discontinued     1 g 100 mL/hr over 30 Minutes Intravenous Every 8 hours 09/11/17 1904 09/12/17 0827   09/11/17 1830   vancomycin (VANCOCIN) 2,000 mg in sodium chloride 0.9 % 500 mL IVPB     2,000 mg 250 mL/hr over 120 Minutes Intravenous  Once 09/11/17 1758 09/12/17 0026   09/11/17 1815  ceFEPIme (MAXIPIME) 2 g in dextrose 5 % 50 mL IVPB     2 g 100 mL/hr over 30 Minutes Intravenous  Once 09/11/17 1809 09/11/17 2208   09/11/17 1800  piperacillin-tazobactam (ZOSYN) IVPB 3.375 g  Status:  Discontinued     3.375 g 12.5 mL/hr over 240 Minutes Intravenous Every 8 hours 09/11/17 1757 09/11/17 1808      Medications:  Scheduled: . docusate sodium  100 mg Oral BID  . polyethylene glycol  17 g Oral Daily  . potassium chloride  40 mEq Oral Once  . rifampin  300 mg Oral Q12H    Objective: Vital signs in last 24 hours: Temp:  [97.1 F (36.2 C)-98.2 F (36.8 C)] 98.2 F (36.8 C) (11/23 0725) Pulse Rate:  [78-90] 79 (11/23 0725) Resp:  [20-23] 23 (11/23 0725) BP: (120-163)/(73-86) 121/77 (11/23 0725) SpO2:  [94 %-99 %] 96 % (11/23 1100) Arterial Line BP: (155-187)/(72-82) 187/82 (11/22 1600)  General appearance: fatigued and no distress Resp: clear to auscultation bilaterally Cardio: regular rate and rhythm GI: normal findings: bowel sounds normal and soft, non-tender Extremities: edema +  Lab Results Recent Labs    09/15/17 0557 09/16/17 0431  WBC 21.1* 15.0*  HGB 11.7* 10.6*  HCT 35.3* 31.5*  NA 138 137  K 3.9 3.8  CL 101 101  CO2 28 29  BUN 7 8  CREATININE 0.76 0.63   Liver Panel Recent Labs    09/15/17 1822 09/16/17 0431  PROT  --  5.2*  ALBUMIN  --  1.7*  AST  --  24  ALT 15 12*  ALKPHOS  --  621*  BILITOT  --  3.4*  BILIDIR 3.1*  --    Sedimentation Rate No results for input(s): ESRSEDRATE in the last 72 hours. C-Reactive Protein No results for input(s): CRP in the last 72 hours.  Microbiology: Recent Results (from the past 240 hour(s))  Urine culture     Status: Abnormal   Collection Time: 09/10/17 12:57 AM  Result Value Ref Range Status   Specimen Description  URINE, CLEAN CATCH  Final   Special Requests NONE  Final   Culture MULTIPLE SPECIES PRESENT, SUGGEST RECOLLECTION (A)  Final   Report Status 09/11/2017 FINAL  Final  Wet prep, genital     Status: Abnormal   Collection Time: 09/10/17  4:20 AM  Result Value Ref Range Status   Yeast Wet Prep HPF POC NONE SEEN NONE SEEN Final   Trich, Wet Prep NONE SEEN NONE SEEN Final   Clue Cells Wet Prep HPF POC NONE SEEN NONE SEEN Final   WBC, Wet Prep HPF POC FEW (A) NONE SEEN Final   Sperm NONE SEEN  Final  Blood culture (routine x 2)     Status: Abnormal   Collection Time: 09/11/17  9:20 AM  Result Value Ref Range Status   Specimen Description BLOOD RIGHT ANTECUBITAL  Final   Special Requests   Final    BOTTLES DRAWN AEROBIC AND ANAEROBIC Blood Culture adequate volume   Culture  Setup Time   Final    GRAM POSITIVE COCCI IN CLUSTERS IN BOTH AEROBIC AND ANAEROBIC BOTTLES CRITICAL RESULT CALLED TO, READ BACK BY AND VERIFIED WITH: Novella Rob 1443 09/12/2017 Mena Goes Performed at [facility], [address] [place], [place] [address]    Culture STAPHYLOCOCCUS AUREUS (A)  Final   Report Status 09/14/2017 FINAL  Final   Organism ID, Bacteria STAPHYLOCOCCUS AUREUS  Final      Susceptibility   Staphylococcus aureus - MIC*    CIPROFLOXACIN <=0.5 SENSITIVE Sensitive     ERYTHROMYCIN >=8 RESISTANT Resistant     GENTAMICIN <=0.5 SENSITIVE Sensitive     OXACILLIN 0.5 SENSITIVE Sensitive     TETRACYCLINE <=1 SENSITIVE Sensitive     VANCOMYCIN <=0.5 SENSITIVE Sensitive     TRIMETH/SULFA <=10 SENSITIVE Sensitive     CLINDAMYCIN <=0.25 SENSITIVE Sensitive     RIFAMPIN <=0.5 SENSITIVE Sensitive     Inducible Clindamycin NEGATIVE Sensitive     * STAPHYLOCOCCUS AUREUS  Blood Culture ID Panel (Reflexed)     Status: Abnormal   Collection Time: 09/11/17  9:20 AM  Result Value Ref Range Status   Enterococcus species NOT DETECTED NOT DETECTED Final   Listeria monocytogenes NOT DETECTED NOT DETECTED  Final   Staphylococcus species DETECTED (A) NOT DETECTED Final    Comment: CRITICAL RESULT CALLED TO, READ BACK BY AND VERIFIED WITH: B. GREEN,PHARMD  [id number] 09/12/2017 T. TYSOR    Staphylococcus aureus DETECTED (A) NOT DETECTED Final    Comment: Methicillin (oxacillin) susceptible Staphylococcus aureus (MSSA). Preferred therapy is anti staphylococcal beta lactam antibiotic (Cefazolin or Nafcillin), unless clinically contraindicated. CRITICAL RESULT CALLED TO, READ BACK BY AND VERIFIED WITH: B. GREEN,PHARMD 1478 09/12/2017 T. TYSOR    Methicillin resistance NOT DETECTED NOT DETECTED Final   Streptococcus species NOT DETECTED NOT DETECTED Final   Streptococcus agalactiae NOT DETECTED NOT DETECTED Final   Streptococcus pneumoniae NOT DETECTED NOT DETECTED Final   Streptococcus pyogenes NOT DETECTED NOT DETECTED Final   Acinetobacter baumannii NOT DETECTED NOT DETECTED Final   Enterobacteriaceae species NOT DETECTED NOT DETECTED Final   Enterobacter cloacae complex NOT DETECTED NOT DETECTED Final   Escherichia coli NOT DETECTED NOT DETECTED Final   Klebsiella oxytoca NOT DETECTED NOT DETECTED Final   Klebsiella pneumoniae NOT DETECTED NOT DETECTED Final   Proteus species NOT DETECTED NOT DETECTED Final   Serratia marcescens NOT DETECTED NOT DETECTED Final   Haemophilus influenzae NOT DETECTED NOT DETECTED Final   Neisseria meningitidis NOT DETECTED NOT DETECTED Final   Pseudomonas aeruginosa NOT DETECTED NOT DETECTED Final   Candida albicans NOT DETECTED NOT DETECTED Final   Candida glabrata NOT DETECTED NOT DETECTED Final   Candida krusei NOT DETECTED NOT DETECTED Final   Candida parapsilosis NOT DETECTED NOT DETECTED Final   Candida tropicalis NOT DETECTED NOT DETECTED Final    Comment: Performed at [facility], [address], [place], [place] [address]  Blood culture (routine x 2)     Status: Abnormal   Collection Time: 09/11/17  9:33 AM  Result Value Ref Range Status    Specimen Description BLOOD BLOOD LEFT HAND  Final   Special Requests   Final    BOTTLES DRAWN AEROBIC AND ANAEROBIC Blood Culture adequate volume   Culture  Setup Time   Final    GRAM POSITIVE COCCI IN CLUSTERS IN BOTH AEROBIC AND ANAEROBIC BOTTLES CRITICAL VALUE NOTED.  VALUE IS CONSISTENT WITH PREVIOUSLY REPORTED AND CALLED VALUE.    Culture (A)  Final    STAPHYLOCOCCUS AUREUS SUSCEPTIBILITIES PERFORMED ON PREVIOUS CULTURE WITHIN THE LAST 5 DAYS. Performed at [facility], [place] [address], [place], [place] [address]    Report Status 09/14/2017 FINAL  Final  MRSA PCR Screening     Status: None   Collection Time: 09/11/17  1:03 PM  Result Value Ref Range Status   MRSA by PCR NEGATIVE NEGATIVE Final    Comment:        The GeneXpert MRSA Assay (FDA approved for NASAL specimens only), is one component of a comprehensive MRSA colonization surveillance program. It is not intended to diagnose MRSA infection nor to guide or monitor treatment for MRSA infections.   Culture, blood (Routine X 2) w Reflex to ID Panel     Status: Abnormal   Collection Time: 09/12/17  9:00 AM  Result Value Ref Range Status   Specimen Description BLOOD RIGHT HAND  Final   Special Requests IN PEDIATRIC BOTTLE Blood Culture adequate volume  Final   Culture  Setup Time   Final    GRAM POSITIVE COCCI IN CLUSTERS IN PEDIATRIC BOTTLE CRITICAL RESULT CALLED TO, READ BACK BY AND VERIFIED WITH: K. Caroline Sauger [id number] 09/13/2017 T. TYSOR    Culture (A)  Final    STAPHYLOCOCCUS AUREUS SUSCEPTIBILITIES PERFORMED ON PREVIOUS CULTURE WITHIN THE LAST 5 DAYS. Performed at [facility], [place] [address],  [place], [place] [address]    Report Status 09/15/2017 FINAL  Final  Culture, blood (Routine X 2) w Reflex to ID Panel     Status: Abnormal   Collection Time: 09/12/17  9:15 AM  Result Value Ref Range Status   Specimen Description BLOOD BLOOD RIGHT HAND  Final   Special Requests IN PEDIATRIC BOTTLE Blood  Culture adequate volume  Final   Culture  Setup Time   Final    GRAM POSITIVE COCCI IN CLUSTERS CRITICAL RESULT CALLED TO, READ BACK BY AND VERIFIED WITH: K. Caroline Sauger [id number] 09/13/2017 T. TYSOR    Culture (A)  Final    STAPHYLOCOCCUS AUREUS SUSCEPTIBILITIES PERFORMED ON PREVIOUS CULTURE WITHIN THE LAST 5 DAYS. Performed at [facility], [place] [address], [place], [place] [address]    Report Status 09/15/2017 FINAL  Final  Culture, Urine     Status: None   Collection Time: 09/12/17 11:45 AM  Result Value Ref Range Status   Specimen Description URINE, CLEAN CATCH  Final   Special Requests NONE  Final   Culture   Final    NO GROWTH Performed at [facility], [place] [address], [place], [place] [address]    Report Status 09/13/2017 FINAL  Final  Culture, blood (routine x 2)     Status: Abnormal   Collection Time: 09/13/17  9:02 AM  Result Value Ref Range Status   Specimen Description BLOOD LEFT HAND  Final   Special Requests IN PEDIATRIC BOTTLE Blood Culture adequate volume  Final   Culture  Setup Time   Final    GRAM POSITIVE COCCI IN CLUSTERS IN PEDIATRIC BOTTLE CRITICAL VALUE NOTED.  VALUE IS CONSISTENT WITH PREVIOUSLY REPORTED AND CALLED VALUE.    Culture (A)  Final    STAPHYLOCOCCUS AUREUS SUSCEPTIBILITIES PERFORMED ON PREVIOUS CULTURE WITHIN THE LAST 5 DAYS.    Report Status 09/17/2017 FINAL  Final  Culture, blood (routine x 2)     Status: Abnormal   Collection Time: 09/13/17  9:07 AM  Result Value Ref Range Status   Specimen Description BLOOD RIGHT HAND  Final   Special Requests IN PEDIATRIC BOTTLE Blood Culture adequate volume  Final   Culture  Setup Time   Final    GRAM POSITIVE COCCI IN CLUSTERS IN PEDIATRIC BOTTLE CRITICAL RESULT CALLED TO, READ BACK BY AND VERIFIED WITHAlona Bene PHARMD [id number] 09/14/17 A BROWNING    Culture STAPHYLOCOCCUS AUREUS (A)  Final   Report Status 09/17/2017 FINAL  Final   Organism ID, Bacteria STAPHYLOCOCCUS AUREUS  Final       Susceptibility   Staphylococcus aureus - MIC*    CIPROFLOXACIN <=0.5 SENSITIVE Sensitive     ERYTHROMYCIN >=8 RESISTANT Resistant     GENTAMICIN <=0.5 SENSITIVE Sensitive     OXACILLIN 0.5 SENSITIVE Sensitive     TETRACYCLINE <=1 SENSITIVE Sensitive     VANCOMYCIN 1 SENSITIVE Sensitive     TRIMETH/SULFA <=10 SENSITIVE Sensitive     CLINDAMYCIN <=0.25 SENSITIVE Sensitive     RIFAMPIN <=0.5 SENSITIVE Sensitive     Inducible Clindamycin NEGATIVE Sensitive     * STAPHYLOCOCCUS AUREUS  MRSA PCR Screening     Status: None   Collection Time: 09/15/17 10:46 AM  Result Value Ref Range Status   MRSA by PCR NEGATIVE NEGATIVE Final    Comment:        The GeneXpert MRSA Assay (FDA approved for NASAL specimens only), is one component of a comprehensive MRSA colonization surveillance program. It  is not intended to diagnose MRSA infection nor to guide or monitor treatment for MRSA infections.   Aerobic/Anaerobic Culture (surgical/deep wound)     Status: None (Preliminary result)   Collection Time: 09/15/17 12:33 PM  Result Value Ref Range Status   Specimen Description ABSCESS PARASPINAL  Final   Special Requests NONE  Final   Gram Stain   Final    RARE WBC PRESENT, PREDOMINANTLY PMN FEW GRAM POSITIVE COCCI IN CLUSTERS    Culture   Final    FEW STAPHYLOCOCCUS AUREUS NO ANAEROBES ISOLATED; CULTURE IN PROGRESS FOR 5 DAYS    Report Status PENDING  Incomplete   Organism ID, Bacteria STAPHYLOCOCCUS AUREUS  Final      Susceptibility   Staphylococcus aureus - MIC*    CIPROFLOXACIN <=0.5 SENSITIVE Sensitive     ERYTHROMYCIN >=8 RESISTANT Resistant     GENTAMICIN <=0.5 SENSITIVE Sensitive     OXACILLIN <=0.25 SENSITIVE Sensitive     TETRACYCLINE <=1 SENSITIVE Sensitive     VANCOMYCIN <=0.5 SENSITIVE Sensitive     TRIMETH/SULFA <=10 SENSITIVE Sensitive     CLINDAMYCIN <=0.25 SENSITIVE Sensitive     RIFAMPIN <=0.5 SENSITIVE Sensitive     Inducible Clindamycin NEGATIVE Sensitive     * FEW  STAPHYLOCOCCUS AUREUS  Aerobic/Anaerobic Culture (surgical/deep wound)     Status: None (Preliminary result)   Collection Time: 09/15/17 12:41 PM  Result Value Ref Range Status   Specimen Description ABSCESS SPINAL EPIDURAL  Final   Special Requests NONE  Final   Gram Stain   Final    RARE WBC PRESENT, PREDOMINANTLY PMN FEW GRAM POSITIVE COCCI IN CLUSTERS    Culture   Final    MODERATE STAPHYLOCOCCUS AUREUS NO ANAEROBES ISOLATED; CULTURE IN PROGRESS FOR 5 DAYS    Report Status PENDING  Incomplete   Organism ID, Bacteria STAPHYLOCOCCUS AUREUS  Final      Susceptibility   Staphylococcus aureus - MIC*    CIPROFLOXACIN <=0.5 SENSITIVE Sensitive     ERYTHROMYCIN >=8 RESISTANT Resistant     GENTAMICIN <=0.5 SENSITIVE Sensitive     OXACILLIN 0.5 SENSITIVE Sensitive     TETRACYCLINE <=1 SENSITIVE Sensitive     VANCOMYCIN 1 SENSITIVE Sensitive     TRIMETH/SULFA <=10 SENSITIVE Sensitive     CLINDAMYCIN <=0.25 SENSITIVE Sensitive     RIFAMPIN <=0.5 SENSITIVE Sensitive     Inducible Clindamycin NEGATIVE Sensitive     * MODERATE STAPHYLOCOCCUS AUREUS    Studies/Results: Dg Cervical Spine 2-3 Views  Result Date: 09/15/2017 CLINICAL DATA:  Posterior cervical fusion EXAM: DG C-ARM 61-120 MIN; CERVICAL SPINE - 2-3 VIEW COMPARISON:  None. FLUOROSCOPY TIME:  Fluoroscopy Time:  5 seconds Radiation Exposure Index (if provided by the fluoroscopic device): Available Number of Acquired Spot Images: 2 FINDINGS: Bilateral posterior cervical fusion is noted extending from C3 to what appears to be C6 on the frontal film on the left and C4-C6 on the right. The inferior aspect of the hardware on the lateral projection is poorly visualized. IMPRESSION: Posterior cervical fusion. Electronically Signed   By: Inez Catalina M.D.   On: 09/15/2017 14:07   Dg C-arm 1-60 Min  Result Date: 09/15/2017 CLINICAL DATA:  Posterior cervical fusion EXAM: DG C-ARM 61-120 MIN; CERVICAL SPINE - 2-3 VIEW COMPARISON:  None.  FLUOROSCOPY TIME:  Fluoroscopy Time:  5 seconds Radiation Exposure Index (if provided by the fluoroscopic device): Available Number of Acquired Spot Images: 2 FINDINGS: Bilateral posterior cervical fusion is noted extending from C3 to what  appears to be C6 on the frontal film on the left and C4-C6 on the right. The inferior aspect of the hardware on the lateral projection is poorly visualized. IMPRESSION: Posterior cervical fusion. Electronically Signed   By: Inez Catalina M.D.   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           Bobby Rumpf MD, FACP Infectious Diseases (pager) [phone] [email or web address] 09/17/2017, 11:57 AM  LOS: 6 days

## 2017-09-18 MED ORDER — HYDROMORPHONE HCL 1 MG/ML IJ SOLN
1.0000 mg | INTRAMUSCULAR | Status: DC | PRN
  Administered 2017-09-18 – 2017-09-19 (×7): 1 mg via INTRAVENOUS
  Filled 2017-09-18 (×7): qty 1

## 2017-09-18 MED ORDER — CELECOXIB 200 MG PO CAPS
200.0000 mg | ORAL_CAPSULE | Freq: Two times a day (BID) | ORAL | Status: DC
  Administered 2017-09-18 (×2): 200 mg via ORAL
  Filled 2017-09-18 (×2): qty 1

## 2017-09-18 NOTE — Progress Notes (Signed)
Occupational Therapy Treatment Patient Details Name: ALEGRA ROST MRN: [id number] DOB: 1966-04-06 Today's Date: 09/18/2017    History of present illness 51 year old female w/ a history of IV drug use, hep C and chronic back pain status post previous back surgery who presented to the ED with severe back and neck pain.  An MRI of the T and L-spine revealed a ventral epidural abscess C2-t8. 1/21 Pt with emergent C3-7 lami and epidural abscess debridement   OT comments  Pt able to perform toilet transfer, LB ADL, and grooming task standing at the sink with min guard assist today. Requires min assist for peri care; pt would benefit from education on toilet aide for increased independence. Reviewed cervical precautions and pt able to maintain throughout session. D/c plan remains appropriate. Will continue to follow acutely.   Follow Up Recommendations  DC plan and follow up therapy as arranged by surgeon;Supervision - Intermittent    Equipment Recommendations  3 in 1 bedside commode;Other (comment)(toilet aide)    Recommendations for Other Services      Precautions / Restrictions Precautions Precautions: Cervical;Fall Precaution Comments: Reviewed cervical precautions with pt Required Braces or Orthoses: Cervical Brace Cervical Brace: Hard collar;At all times(orders state off for bathing/dressing/when in bed) Restrictions Weight Bearing Restrictions: No       Mobility Bed Mobility Overal bed mobility: Needs Assistance Bed Mobility: Rolling;Sidelying to Sit;Sit to Sidelying Rolling: Supervision Sidelying to sit: Supervision     Sit to sidelying: Supervision General bed mobility comments: Good technique for log roll with HOB flat and use of bed rails. Increased time and effort but no physical assist required  Transfers Overall transfer level: Needs assistance Equipment used: None Transfers: Sit to/from Stand Sit to Stand: Min guard         General transfer comment:  for safety, mild unsteadiness but no major LOB    Balance Overall balance assessment: Needs assistance Sitting-balance support: Feet supported;No upper extremity supported Sitting balance-Leahy Scale: Good     Standing balance support: No upper extremity supported;During functional activity Standing balance-Leahy Scale: Fair                             ADL either performed or assessed with clinical judgement   ADL Overall ADL's : Needs assistance/impaired     Grooming: Min guard;Standing;Wash/dry hands               Lower Body Dressing: Min guard;Sit to/from stand Lower Body Dressing Details (indicate cue type and reason): Pt able to cross foot over opposite knee bil. Educated pt on compensatory strategies for LB ADL; pt verbalized understanding Toilet Transfer: Min guard;Ambulation;Comfort height toilet;Grab bars   Toileting- Clothing Manipulation and Hygiene: Minimal assistance;Sit to/from stand;Sitting/lateral lean Toileting - Clothing Manipulation Details (indicate cue type and reason): Assist provided for peri care following BM; educated on compensatory strategies but pt may benefit from toilet aide for increased independence.     Functional mobility during ADLs: Min guard General ADL Comments: Encouraged mobility with RN staff throughout the day while in hospital and need to sit up for every meal; pt verbalized understanding     Vision       Perception     Praxis      Cognition Arousal/Alertness: Awake/alert Behavior During Therapy: Anxious Overall Cognitive Status: Within Functional Limits for tasks assessed  Exercises     Shoulder Instructions       General Comments      Pertinent Vitals/ Pain       Pain Assessment: Faces Faces Pain Scale: Hurts even more Pain Location: neck Pain Descriptors / Indicators: Grimacing;Guarding Pain Intervention(s): Monitored during  session  Home Living                                          Prior Functioning/Environment              Frequency  Min 3X/week        Progress Toward Goals  OT Goals(current goals can now be found in the care plan section)  Progress towards OT goals: Progressing toward goals  Acute Rehab OT Goals Patient Stated Goal: get better and reduce pain OT Goal Formulation: With patient  Plan Discharge plan remains appropriate    Co-evaluation                 AM-PAC PT "6 Clicks" Daily Activity     Outcome Measure   Help from another person eating meals?: A Little Help from another person taking care of personal grooming?: A Little Help from another person toileting, which includes using toliet, bedpan, or urinal?: A Little Help from another person bathing (including washing, rinsing, drying)?: A Little Help from another person to put on and taking off regular upper body clothing?: A Little Help from another person to put on and taking off regular lower body clothing?: A Little 6 Click Score: 18    End of Session Equipment Utilized During Treatment: Cervical collar  OT Visit Diagnosis: Unsteadiness on feet (R26.81);Muscle weakness (generalized) (M62.81);Pain Pain - part of body: (neck)   Activity Tolerance Patient tolerated treatment well   Patient Left in bed;with call bell/phone within reach   Nurse Communication Mobility status;Other (comment)(pt to be OOB in chair for dinner)        Time: 2979-8921 OT Time Calculation (min): 16 min  Charges: OT General Charges $OT Visit: 1 Visit OT Treatments $Self Care/Home Management : 8-22 mins  Alaa Mullally A. Ulice Brilliant, M.S., OTR/L Pager: [place] 09/18/2017, 4:29 PM

## 2017-09-18 NOTE — H&P (View-Only) (Signed)
INFECTIOUS DISEASE PROGRESS NOTE  ID: [name] is a 51 y.o. 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   Mediastinal mass   Obesity, Class III, BMI 40-49.9 (morbid obesity) (HCC)   Leucocytosis   Neck pain   Abscess in epidural space of cervical spine  Subjective: Continues to complain of neck pain  Abtx:  Anti-infectives (From admission, onward)   Start     Dose/Rate Route Frequency Ordered Stop   09/16/17 2300  nafcillin 2 g in dextrose 5 % 100 mL IVPB  Status:  Discontinued     2 g 200 mL/hr over 30 Minutes Intravenous Every 4 hours 09/15/17 2035 09/15/17 2303   09/16/17 1400  rifampin (RIFADIN) capsule 300 mg     300 mg Oral Every 12 hours 09/16/17 1344     09/15/17 2315  nafcillin 2 g in dextrose 5 % 100 mL IVPB     2 g 200 mL/hr over 30 Minutes Intravenous Every 4 hours 09/15/17 2303     09/15/17 1237  bacitracin 50,000 Units in sodium chloride irrigation 0.9 % 500 mL irrigation  Status:  Discontinued       As needed 09/15/17 1237 09/15/17 1433   09/13/17 1200  nafcillin 2 g in dextrose 5 % 100 mL IVPB  Status:  Discontinued     2 g 200 mL/hr over 30 Minutes Intravenous Every 4 hours 09/13/17 0918 09/15/17 2035   09/12/17 1400  ceFAZolin (ANCEF) IVPB 2g/100 mL premix  Status:  Discontinued     2 g 200 mL/hr over 30 Minutes Intravenous Every 8 hours 09/12/17 0848 09/13/17 0918   09/12/17 1000  vancomycin (VANCOCIN) 1,250 mg in sodium chloride 0.9 % 250 mL IVPB  Status:  Discontinued     1,250 mg 166.7 mL/hr over 90 Minutes Intravenous Every 12 hours 09/11/17 1904 09/12/17 0828   09/12/17 0600  ceFEPIme (MAXIPIME) 1 g in dextrose 5 % 50 mL IVPB  Status:  Discontinued     1 g 100 mL/hr over 30 Minutes Intravenous Every 8 hours 09/11/17 1904 09/12/17 0827   09/11/17 1830   vancomycin (VANCOCIN) 2,000 mg in sodium chloride 0.9 % 500 mL IVPB     2,000 mg 250 mL/hr over 120 Minutes Intravenous  Once 09/11/17 1758 09/12/17 0026   09/11/17 1815  ceFEPIme (MAXIPIME) 2 g in dextrose 5 % 50 mL IVPB     2 g 100 mL/hr over 30 Minutes Intravenous  Once 09/11/17 1809 09/11/17 2208   09/11/17 1800  piperacillin-tazobactam (ZOSYN) IVPB 3.375 g  Status:  Discontinued     3.375 g 12.5 mL/hr over 240 Minutes Intravenous Every 8 hours 09/11/17 1757 09/11/17 1808      Medications:  Scheduled: . celecoxib  200 mg Oral BID  . Chlorhexidine Gluconate Cloth  6 each Topical Q2200  . docusate sodium  100 mg Oral BID  . polyethylene glycol  17 g Oral Daily  . rifampin  300 mg Oral Q12H  . sodium chloride flush  10-40 mL Intracatheter Q12H    Objective: Vital signs in last 24 hours: Temp:  [98 F (36.7 C)-98.6 F (37 C)] 98 F (36.7 C) (11/24 1238) Pulse Rate:  [67-90] 72 (11/24 1238) Resp:  [16-21] 17 (11/24 1238) BP: (  133-151)/(72-97) 151/92 (11/24 1238) SpO2:  [94 %-100 %] 98 % (11/24 1238)   General appearance: alert, cooperative and mild distress Neck: posterior drain.  Resp: clear to auscultation bilaterally Cardio: regular rate and rhythm GI: normal findings: bowel sounds normal and soft, non-tender  Lab Results Recent Labs    09/16/17 0431  WBC 15.0*  HGB 10.6*  HCT 31.5*  NA 137  K 3.8  CL 101  CO2 29  BUN 8  CREATININE 0.63   Liver Panel Recent Labs    09/15/17 1822 09/16/17 0431  PROT  --  5.2*  ALBUMIN  --  1.7*  AST  --  24  ALT 15 12*  ALKPHOS  --  621*  BILITOT  --  3.4*  BILIDIR 3.1*  --    Sedimentation Rate No results for input(s): ESRSEDRATE in the last 72 hours. C-Reactive Protein No results for input(s): CRP in the last 72 hours.  Microbiology: Recent Results (from the past 240 hour(s))  Urine culture     Status: Abnormal   Collection Time: 09/10/17 12:57 AM  Result Value Ref Range Status   Specimen Description  URINE, CLEAN CATCH  Final   Special Requests NONE  Final   Culture MULTIPLE SPECIES PRESENT, SUGGEST RECOLLECTION (A)  Final   Report Status 09/11/2017 FINAL  Final  Wet prep, genital     Status: Abnormal   Collection Time: 09/10/17  4:20 AM  Result Value Ref Range Status   Yeast Wet Prep HPF POC NONE SEEN NONE SEEN Final   Trich, Wet Prep NONE SEEN NONE SEEN Final   Clue Cells Wet Prep HPF POC NONE SEEN NONE SEEN Final   WBC, Wet Prep HPF POC FEW (A) NONE SEEN Final   Sperm NONE SEEN  Final  Blood culture (routine x 2)     Status: Abnormal   Collection Time: 09/11/17  9:20 AM  Result Value Ref Range Status   Specimen Description BLOOD RIGHT ANTECUBITAL  Final   Special Requests   Final    BOTTLES DRAWN AEROBIC AND ANAEROBIC Blood Culture adequate volume   Culture  Setup Time   Final    GRAM POSITIVE COCCI IN CLUSTERS IN BOTH AEROBIC AND ANAEROBIC BOTTLES CRITICAL RESULT CALLED TO, READ BACK BY AND VERIFIED WITH: Novella Rob [id number] 09/12/2017 Mena Goes Performed at [facility], [address] [place], [place] [address]    Culture STAPHYLOCOCCUS AUREUS (A)  Final   Report Status 09/14/2017 FINAL  Final   Organism ID, Bacteria STAPHYLOCOCCUS AUREUS  Final      Susceptibility   Staphylococcus aureus - MIC*    CIPROFLOXACIN <=0.5 SENSITIVE Sensitive     ERYTHROMYCIN >=8 RESISTANT Resistant     GENTAMICIN <=0.5 SENSITIVE Sensitive     OXACILLIN 0.5 SENSITIVE Sensitive     TETRACYCLINE <=1 SENSITIVE Sensitive     VANCOMYCIN <=0.5 SENSITIVE Sensitive     TRIMETH/SULFA <=10 SENSITIVE Sensitive     CLINDAMYCIN <=0.25 SENSITIVE Sensitive     RIFAMPIN <=0.5 SENSITIVE Sensitive     Inducible Clindamycin NEGATIVE Sensitive     * STAPHYLOCOCCUS AUREUS  Blood Culture ID Panel (Reflexed)     Status: Abnormal   Collection Time: 09/11/17  9:20 AM  Result Value Ref Range Status   Enterococcus species NOT DETECTED NOT DETECTED Final   Listeria monocytogenes NOT DETECTED NOT DETECTED  Final   Staphylococcus species DETECTED (A) NOT DETECTED Final    Comment: CRITICAL RESULT CALLED TO, READ BACK  BY AND VERIFIED WITH: B. GREEN,PHARMD [id number] 09/12/2017 T. TYSOR    Staphylococcus aureus DETECTED (A) NOT DETECTED Final    Comment: Methicillin (oxacillin) susceptible Staphylococcus aureus (MSSA). Preferred therapy is anti staphylococcal beta lactam antibiotic (Cefazolin or Nafcillin), unless clinically contraindicated. CRITICAL RESULT CALLED TO, READ BACK BY AND VERIFIED WITH: B. GREEN,PHARMD [id number] 09/12/2017 T. TYSOR    Methicillin resistance NOT DETECTED NOT DETECTED Final   Streptococcus species NOT DETECTED NOT DETECTED Final   Streptococcus agalactiae NOT DETECTED NOT DETECTED Final   Streptococcus pneumoniae NOT DETECTED NOT DETECTED Final   Streptococcus pyogenes NOT DETECTED NOT DETECTED Final   Acinetobacter baumannii NOT DETECTED NOT DETECTED Final   Enterobacteriaceae species NOT DETECTED NOT DETECTED Final   Enterobacter cloacae complex NOT DETECTED NOT DETECTED Final   Escherichia coli NOT DETECTED NOT DETECTED Final   Klebsiella oxytoca NOT DETECTED NOT DETECTED Final   Klebsiella pneumoniae NOT DETECTED NOT DETECTED Final   Proteus species NOT DETECTED NOT DETECTED Final   Serratia marcescens NOT DETECTED NOT DETECTED Final   Haemophilus influenzae NOT DETECTED NOT DETECTED Final   Neisseria meningitidis NOT DETECTED NOT DETECTED Final   Pseudomonas aeruginosa NOT DETECTED NOT DETECTED Final   Candida albicans NOT DETECTED NOT DETECTED Final   Candida glabrata NOT DETECTED NOT DETECTED Final   Candida krusei NOT DETECTED NOT DETECTED Final   Candida parapsilosis NOT DETECTED NOT DETECTED Final   Candida tropicalis NOT DETECTED NOT DETECTED Final    Comment: Performed at [facility], [address], [place], [place] [address]  Blood culture (routine x 2)     Status: Abnormal   Collection Time: 09/11/17  9:33 AM  Result Value Ref Range Status    Specimen Description BLOOD BLOOD LEFT HAND  Final   Special Requests   Final    BOTTLES DRAWN AEROBIC AND ANAEROBIC Blood Culture adequate volume   Culture  Setup Time   Final    GRAM POSITIVE COCCI IN CLUSTERS IN BOTH AEROBIC AND ANAEROBIC BOTTLES CRITICAL VALUE NOTED.  VALUE IS CONSISTENT WITH PREVIOUSLY REPORTED AND CALLED VALUE.    Culture (A)  Final    STAPHYLOCOCCUS AUREUS SUSCEPTIBILITIES PERFORMED ON PREVIOUS CULTURE WITHIN THE LAST 5 DAYS. Performed at [facility], [place] [address] [place], [place] [address]    Report Status 09/14/2017 FINAL  Final  MRSA PCR Screening     Status: None   Collection Time: 09/11/17  1:03 PM  Result Value Ref Range Status   MRSA by PCR NEGATIVE NEGATIVE Final    Comment:        The GeneXpert MRSA Assay (FDA approved for NASAL specimens only), is one component of a comprehensive MRSA colonization surveillance program. It is not intended to diagnose MRSA infection nor to guide or monitor treatment for MRSA infections.   Culture, blood (Routine X 2) w Reflex to ID Panel     Status: Abnormal   Collection Time: 09/12/17  9:00 AM  Result Value Ref Range Status   Specimen Description BLOOD RIGHT HAND  Final   Special Requests IN PEDIATRIC BOTTLE Blood Culture adequate volume  Final   Culture  Setup Time   Final    GRAM POSITIVE COCCI IN CLUSTERS IN PEDIATRIC BOTTLE CRITICAL RESULT CALLED TO, READ BACK BY AND VERIFIED WITH: K. Caroline Sauger [id number] 09/13/2017 T. TYSOR    Culture (A)  Final    STAPHYLOCOCCUS AUREUS SUSCEPTIBILITIES PERFORMED ON PREVIOUS CULTURE WITHIN THE LAST 5 DAYS. Performed at [facility], [place] [address] [place], [place] [address]    Report Status 09/15/2017 FINAL  Final  Culture, blood (Routine X 2) w Reflex to ID Panel     Status: Abnormal   Collection Time: 09/12/17  9:15 AM  Result Value Ref Range Status   Specimen Description BLOOD BLOOD RIGHT HAND  Final   Special Requests IN PEDIATRIC BOTTLE Blood  Culture adequate volume  Final   Culture  Setup Time   Final    GRAM POSITIVE COCCI IN CLUSTERS CRITICAL RESULT CALLED TO, READ BACK BY AND VERIFIED WITH: K. Caroline Sauger [id number] 09/13/2017 T. TYSOR    Culture (A)  Final    STAPHYLOCOCCUS AUREUS SUSCEPTIBILITIES PERFORMED ON PREVIOUS CULTURE WITHIN THE LAST 5 DAYS. Performed at [facility], [place] [address] [place], [place] [address]    Report Status 09/15/2017 FINAL  Final  Culture, Urine     Status: None   Collection Time: 09/12/17 11:45 AM  Result Value Ref Range Status   Specimen Description URINE, CLEAN CATCH  Final   Special Requests NONE  Final   Culture   Final    NO GROWTH Performed at [facility], [place] [address], [place], [place] [address]    Report Status 09/13/2017 FINAL  Final  Culture, blood (routine x 2)     Status: Abnormal   Collection Time: 09/13/17  9:02 AM  Result Value Ref Range Status   Specimen Description BLOOD LEFT HAND  Final   Special Requests IN PEDIATRIC BOTTLE Blood Culture adequate volume  Final   Culture  Setup Time   Final    GRAM POSITIVE COCCI IN CLUSTERS IN PEDIATRIC BOTTLE CRITICAL VALUE NOTED.  VALUE IS CONSISTENT WITH PREVIOUSLY REPORTED AND CALLED VALUE.    Culture (A)  Final    STAPHYLOCOCCUS AUREUS SUSCEPTIBILITIES PERFORMED ON PREVIOUS CULTURE WITHIN THE LAST 5 DAYS.    Report Status 09/17/2017 FINAL  Final  Culture, blood (routine x 2)     Status: Abnormal   Collection Time: 09/13/17  9:07 AM  Result Value Ref Range Status   Specimen Description BLOOD RIGHT HAND  Final   Special Requests IN PEDIATRIC BOTTLE Blood Culture adequate volume  Final   Culture  Setup Time   Final    GRAM POSITIVE COCCI IN CLUSTERS IN PEDIATRIC BOTTLE CRITICAL RESULT CALLED TO, READ BACK BY AND VERIFIED WITHAlona Bene PHARMD [id number] 09/14/17 A BROWNING    Culture STAPHYLOCOCCUS AUREUS (A)  Final   Report Status 09/17/2017 FINAL  Final   Organism ID, Bacteria STAPHYLOCOCCUS AUREUS  Final       Susceptibility   Staphylococcus aureus - MIC*    CIPROFLOXACIN <=0.5 SENSITIVE Sensitive     ERYTHROMYCIN >=8 RESISTANT Resistant     GENTAMICIN <=0.5 SENSITIVE Sensitive     OXACILLIN 0.5 SENSITIVE Sensitive     TETRACYCLINE <=1 SENSITIVE Sensitive     VANCOMYCIN 1 SENSITIVE Sensitive     TRIMETH/SULFA <=10 SENSITIVE Sensitive     CLINDAMYCIN <=0.25 SENSITIVE Sensitive     RIFAMPIN <=0.5 SENSITIVE Sensitive     Inducible Clindamycin NEGATIVE Sensitive     * STAPHYLOCOCCUS AUREUS  MRSA PCR Screening     Status: None   Collection Time: 09/15/17 10:46 AM  Result Value Ref Range Status   MRSA by PCR NEGATIVE NEGATIVE Final    Comment:        The GeneXpert MRSA Assay (FDA approved for NASAL specimens only), is one component of a  comprehensive MRSA colonization surveillance program. It is not intended to diagnose MRSA infection nor to guide or monitor treatment for MRSA infections.   Aerobic/Anaerobic Culture (surgical/deep wound)     Status: None (Preliminary result)   Collection Time: 09/15/17 12:33 PM  Result Value Ref Range Status   Specimen Description ABSCESS PARASPINAL  Final   Special Requests NONE  Final   Gram Stain   Final    RARE WBC PRESENT, PREDOMINANTLY PMN FEW GRAM POSITIVE COCCI IN CLUSTERS    Culture   Final    FEW STAPHYLOCOCCUS AUREUS NO ANAEROBES ISOLATED; CULTURE IN PROGRESS FOR 5 DAYS    Report Status PENDING  Incomplete   Organism ID, Bacteria STAPHYLOCOCCUS AUREUS  Final      Susceptibility   Staphylococcus aureus - MIC*    CIPROFLOXACIN <=0.5 SENSITIVE Sensitive     ERYTHROMYCIN >=8 RESISTANT Resistant     GENTAMICIN <=0.5 SENSITIVE Sensitive     OXACILLIN <=0.25 SENSITIVE Sensitive     TETRACYCLINE <=1 SENSITIVE Sensitive     VANCOMYCIN <=0.5 SENSITIVE Sensitive     TRIMETH/SULFA <=10 SENSITIVE Sensitive     CLINDAMYCIN <=0.25 SENSITIVE Sensitive     RIFAMPIN <=0.5 SENSITIVE Sensitive     Inducible Clindamycin NEGATIVE Sensitive     * FEW  STAPHYLOCOCCUS AUREUS  Aerobic/Anaerobic Culture (surgical/deep wound)     Status: None (Preliminary result)   Collection Time: 09/15/17 12:41 PM  Result Value Ref Range Status   Specimen Description ABSCESS SPINAL EPIDURAL  Final   Special Requests NONE  Final   Gram Stain   Final    RARE WBC PRESENT, PREDOMINANTLY PMN FEW GRAM POSITIVE COCCI IN CLUSTERS    Culture   Final    MODERATE STAPHYLOCOCCUS AUREUS NO ANAEROBES ISOLATED; CULTURE IN PROGRESS FOR 5 DAYS    Report Status PENDING  Incomplete   Organism ID, Bacteria STAPHYLOCOCCUS AUREUS  Final      Susceptibility   Staphylococcus aureus - MIC*    CIPROFLOXACIN <=0.5 SENSITIVE Sensitive     ERYTHROMYCIN >=8 RESISTANT Resistant     GENTAMICIN <=0.5 SENSITIVE Sensitive     OXACILLIN 0.5 SENSITIVE Sensitive     TETRACYCLINE <=1 SENSITIVE Sensitive     VANCOMYCIN 1 SENSITIVE Sensitive     TRIMETH/SULFA <=10 SENSITIVE Sensitive     CLINDAMYCIN <=0.25 SENSITIVE Sensitive     RIFAMPIN <=0.5 SENSITIVE Sensitive     Inducible Clindamycin NEGATIVE Sensitive     * MODERATE STAPHYLOCOCCUS AUREUS  Culture, blood (Routine X 2) w Reflex to ID Panel     Status: None (Preliminary result)   Collection Time: 09/17/17  7:53 AM  Result Value Ref Range Status   Specimen Description BLOOD RIGHT HAND  Final   Special Requests IN PEDIATRIC BOTTLE Blood Culture adequate volume  Final   Culture  Setup Time   Final    GRAM POSITIVE COCCI AEROBIC BOTTLE ONLY CRITICAL VALUE NOTED.  VALUE IS CONSISTENT WITH PREVIOUSLY REPORTED AND CALLED VALUE.    Culture GRAM POSITIVE COCCI  Final   Report Status PENDING  Incomplete    Studies/Results: No results found.   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 Available 11-25 as needed. Dr [name] will return on 11-26         Bobby Rumpf MD, FACP Infectious Diseases (pager) [phone] [email or web address] 09/18/2017, 12:59 PM  LOS: 7 days

## 2017-09-18 NOTE — Progress Notes (Signed)
Patient ID: [name], female   DOB: July 04, 1966, 51 y.o.   MRN: [id number] Complains of neck pain only, seems to be moving her arms and legs very well. Walk multiple times as today. In her cervical collar. No arm pain or numbness or tingling.  Continue collar, continue antibiotics. Continue to mobilize.

## 2017-09-18 NOTE — Progress Notes (Signed)
[place] TEAM 1 - Stepdown/ICU TEAM  [name]  WUJ:[id number] DOB: 1966/09/27 DOA: 09/11/2017 PCP: Marliss Coots, NP    Brief Narrative:  51 year old female w/ a history of IV drug use and chronic back pain status post previous back surgery who presented to the ED with severe back and neck pain.  An MRI of the T and L-spine revealed a ventral epidural abscess.  Patient was also noted to have MSSA bacteremia.    Patient was placed empirically on IV antibiotics.  ID and neurosurgery consulted.  Patient was subsequently transferred to [facility].  Significant Events: 11/17 admit to [facility]  11/18 transfer to [facility] 11/21 developed urinary retention - emergent C3-C7 laminectomy for decompression of spinal cord and debridement of epidural abscess  Subjective: Reports continued severe pain in her neck but states that overall she is beginning to feel a little bit better.  Denies chest pain shortness of breath nausea or vomiting.  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 ID and Neurosurgery directing care of this issue - was to have TEE 11/21 but this was interrupted by need for urgent OR decompression - post-op care per NS - will need TEE rescheduled when clear for same per NS - will plan for this week  Hypotension Resolved with volume resuscitation and treatment of sepsis  Lymphadenopathy / mediastinal and paravertebral soft tissue masses A consequence of her severe infection   Polysubstance abuse/IV drug use Possessed suspected drugs and definite drug paraphernalia at time of admission - no clear evidence of withdrawal at this time   Hep C+ Will need f/u after d/c in the ID clinic  Morbid Obesity - Body mass index is 41.21 kg/m.   DVT prophylaxis: SCDs Code Status: FULL CODE Family Communication: no family present at time of exam  Disposition Plan: SDU  Consultants:   Heme/Onc ID NS  Antimicrobials:  Nafcillin 11/19 > Rifampin 11/22 > Cefazolin 11/18 > 11/19 Cefepime 09/11/2017 > 09/12/2017 Vancomycin 11/17  Objective: Blood pressure (!) 151/92, pulse 72, temperature 98 F (36.7 C), temperature source Oral, resp. rate 17, height 5\' 6"  (1.676 m), weight 115.8 kg (255 lb 4.7 oz), SpO2 98 %.  Intake/Output Summary (Last 24 hours) at 09/18/2017 1608 Last data filed at 09/18/2017 1524 Gross per 24 hour  Intake 1352 ml  Output 5265 ml  Net -3913 ml   Filed Weights   09/12/17 1319 09/13/17 0730 09/14/17 0831  Weight: 115.8 kg (255 lb 4.7 oz) 117.4 kg (258 lb 13.1 oz) 115.8 kg (255 lb 4.7 oz)    Examination: General: No acute respiratory distress Lungs: Clear to auscultation bilaterally without wheezes or crackles Cardiovascular: Regular rate and rhythm without murmur gallop or rub normal S1 and S2 Abdomen: Nontender, nondistended, soft, bowel sounds positive, no rebound, no ascites, no appreciable mass Extremities: No significant cyanosis, clubbing, or edema bilateral lower extremities  CBC: Recent Labs  Lab 09/12/17 0312 09/12/17 0910 09/13/17 0347 09/14/17 0401 09/15/17 0557 09/16/17 0431  WBC 19.2*  --  19.1* 19.0* 21.1* 15.0*  NEUTROABS  --  15.8* 15.3*  --   --   --   HGB 11.5*  --  13.1 12.2 11.7* 10.6*  HCT 33.8*  --  38.1 37.4 35.3* 31.5*  MCV 90.9  --  90.1 90.8 91.7 89.7  PLT 292  --  312 307 362 956   Basic Metabolic Panel: Recent Labs  Lab  09/12/17 0312 09/12/17 0910 09/13/17 0347 09/14/17 0401 09/15/17 0557 09/16/17 0431  NA 137  --  137 137 138 137  K 3.7  --  3.6 3.3* 3.9 3.8  CL 103  --  104 102 101 101  CO2 26  --  27 28 28 29   GLUCOSE 88  --  89 93 77 128*  BUN 7  --  <5* 6 7 8   CREATININE 0.66  --  0.67 0.71 0.76 0.63  CALCIUM 7.5*  --  7.7* 7.6* 7.8* 7.4*  MG  --  2.0 2.0 1.9  --   --   PHOS  --   --  3.7 5.0*  --   --    GFR: Estimated Creatinine Clearance: 111.2 mL/min (by C-G formula based on  SCr of 0.63 mg/dL).  Liver Function Tests: Recent Labs  Lab 09/12/17 0312 09/13/17 0347 09/14/17 0401 09/15/17 1822 09/16/17 0431  AST 31 41 36  --  24  ALT 20 19 16 15  12*  ALKPHOS 131* 373* 513*  --  621*  BILITOT 1.4* 2.0* 3.5*  --  3.4*  PROT 5.3* 5.6* 5.2*  --  5.2*  ALBUMIN 2.2* 2.1* 1.9*  --  1.7*    Coagulation Profile: Recent Labs  Lab 09/13/17 0347 09/15/17 0803  INR 1.25 1.19    HbA1C: Hgb A1c MFr Bld  Date/Time Value Ref Range Status  04/11/2017 06:20 PM 5.9 (H) 4.8 - 5.6 % Final    Comment:    (NOTE)         Pre-diabetes: 5.7 - 6.4         Diabetes: >6.4         Glycemic control for adults with diabetes: <7.0      Recent Results (from the past 240 hour(s))  Urine culture     Status: Abnormal   Collection Time: 09/10/17 12:57 AM  Result Value Ref Range Status   Specimen Description URINE, CLEAN CATCH  Final   Special Requests NONE  Final   Culture MULTIPLE SPECIES PRESENT, SUGGEST RECOLLECTION (A)  Final   Report Status 09/11/2017 FINAL  Final  Wet prep, genital     Status: Abnormal   Collection Time: 09/10/17  4:20 AM  Result Value Ref Range Status   Yeast Wet Prep HPF POC NONE SEEN NONE SEEN Final   Trich, Wet Prep NONE SEEN NONE SEEN Final   Clue Cells Wet Prep HPF POC NONE SEEN NONE SEEN Final   WBC, Wet Prep HPF POC FEW (A) NONE SEEN Final   Sperm NONE SEEN  Final  Blood culture (routine x 2)     Status: Abnormal   Collection Time: 09/11/17  9:20 AM  Result Value Ref Range Status   Specimen Description BLOOD RIGHT ANTECUBITAL  Final   Special Requests   Final    BOTTLES DRAWN AEROBIC AND ANAEROBIC Blood Culture adequate volume   Culture  Setup Time   Final    GRAM POSITIVE COCCI IN CLUSTERS IN BOTH AEROBIC AND ANAEROBIC BOTTLES CRITICAL RESULT CALLED TO, READ BACK BY AND VERIFIED WITH: Novella Rob [id number] 09/12/2017 Mena Goes Performed at [facility], [address] [place], [place] [address]    Culture STAPHYLOCOCCUS AUREUS  (A)  Final   Report Status 09/14/2017 FINAL  Final   Organism ID, Bacteria STAPHYLOCOCCUS AUREUS  Final      Susceptibility   Staphylococcus aureus - MIC*    CIPROFLOXACIN <=0.5 SENSITIVE Sensitive  ERYTHROMYCIN >=8 RESISTANT Resistant     GENTAMICIN <=0.5 SENSITIVE Sensitive     OXACILLIN 0.5 SENSITIVE Sensitive     TETRACYCLINE <=1 SENSITIVE Sensitive     VANCOMYCIN <=0.5 SENSITIVE Sensitive     TRIMETH/SULFA <=10 SENSITIVE Sensitive     CLINDAMYCIN <=0.25 SENSITIVE Sensitive     RIFAMPIN <=0.5 SENSITIVE Sensitive     Inducible Clindamycin NEGATIVE Sensitive     * STAPHYLOCOCCUS AUREUS  Blood Culture ID Panel (Reflexed)     Status: Abnormal   Collection Time: 09/11/17  9:20 AM  Result Value Ref Range Status   Enterococcus species NOT DETECTED NOT DETECTED Final   Listeria monocytogenes NOT DETECTED NOT DETECTED Final   Staphylococcus species DETECTED (A) NOT DETECTED Final    Comment: CRITICAL RESULT CALLED TO, READ BACK BY AND VERIFIED WITH: B. GREEN,PHARMD [id number] 09/12/2017 T. TYSOR    Staphylococcus aureus DETECTED (A) NOT DETECTED Final    Comment: Methicillin (oxacillin) susceptible Staphylococcus aureus (MSSA). Preferred therapy is anti staphylococcal beta lactam antibiotic (Cefazolin or Nafcillin), unless clinically contraindicated. CRITICAL RESULT CALLED TO, READ BACK BY AND VERIFIED WITH: B. GREEN,PHARMD [id number] 09/12/2017 T. TYSOR    Methicillin resistance NOT DETECTED NOT DETECTED Final   Streptococcus species NOT DETECTED NOT DETECTED Final   Streptococcus agalactiae NOT DETECTED NOT DETECTED Final   Streptococcus pneumoniae NOT DETECTED NOT DETECTED Final   Streptococcus pyogenes NOT DETECTED NOT DETECTED Final   Acinetobacter baumannii NOT DETECTED NOT DETECTED Final   Enterobacteriaceae species NOT DETECTED NOT DETECTED Final   Enterobacter cloacae complex NOT DETECTED NOT DETECTED Final   Escherichia coli NOT DETECTED NOT DETECTED Final   Klebsiella oxytoca NOT  DETECTED NOT DETECTED Final   Klebsiella pneumoniae NOT DETECTED NOT DETECTED Final   Proteus species NOT DETECTED NOT DETECTED Final   Serratia marcescens NOT DETECTED NOT DETECTED Final   Haemophilus influenzae NOT DETECTED NOT DETECTED Final   Neisseria meningitidis NOT DETECTED NOT DETECTED Final   Pseudomonas aeruginosa NOT DETECTED NOT DETECTED Final   Candida albicans NOT DETECTED NOT DETECTED Final   Candida glabrata NOT DETECTED NOT DETECTED Final   Candida krusei NOT DETECTED NOT DETECTED Final   Candida parapsilosis NOT DETECTED NOT DETECTED Final   Candida tropicalis NOT DETECTED NOT DETECTED Final    Comment: Performed at [facility], [address] [place], [place] [address]  Blood culture (routine x 2)     Status: Abnormal   Collection Time: 09/11/17  9:33 AM  Result Value Ref Range Status   Specimen Description BLOOD BLOOD LEFT HAND  Final   Special Requests   Final    BOTTLES DRAWN AEROBIC AND ANAEROBIC Blood Culture adequate volume   Culture  Setup Time   Final    GRAM POSITIVE COCCI IN CLUSTERS IN BOTH AEROBIC AND ANAEROBIC BOTTLES CRITICAL VALUE NOTED.  VALUE IS CONSISTENT WITH PREVIOUSLY REPORTED AND CALLED VALUE.    Culture (A)  Final    STAPHYLOCOCCUS AUREUS SUSCEPTIBILITIES PERFORMED ON PREVIOUS CULTURE WITHIN THE LAST 5 DAYS. Performed at [facility], [place] [address], [place], [place] [address]    Report Status 09/14/2017 FINAL  Final  MRSA PCR Screening     Status: None   Collection Time: 09/11/17  1:03 PM  Result Value Ref Range Status   MRSA by PCR NEGATIVE NEGATIVE Final    Comment:        The GeneXpert MRSA Assay (FDA approved for NASAL specimens only), is one component of a  comprehensive MRSA colonization surveillance program. It is not intended to diagnose MRSA infection nor to guide or monitor treatment for MRSA infections.   Culture, blood (Routine X 2) w Reflex to ID Panel     Status: Abnormal   Collection Time: 09/12/17   9:00 AM  Result Value Ref Range Status   Specimen Description BLOOD RIGHT HAND  Final   Special Requests IN PEDIATRIC BOTTLE Blood Culture adequate volume  Final   Culture  Setup Time   Final    GRAM POSITIVE COCCI IN CLUSTERS IN PEDIATRIC BOTTLE CRITICAL RESULT CALLED TO, READ BACK BY AND VERIFIED WITH: K. Caroline Sauger [id number] 09/13/2017 T. TYSOR    Culture (A)  Final    STAPHYLOCOCCUS AUREUS SUSCEPTIBILITIES PERFORMED ON PREVIOUS CULTURE WITHIN THE LAST 5 DAYS. Performed at [facility], [place] [address] [place], [place] [address]    Report Status 09/15/2017 FINAL  Final  Culture, blood (Routine X 2) w Reflex to ID Panel     Status: Abnormal   Collection Time: 09/12/17  9:15 AM  Result Value Ref Range Status   Specimen Description BLOOD BLOOD RIGHT HAND  Final   Special Requests IN PEDIATRIC BOTTLE Blood Culture adequate volume  Final   Culture  Setup Time   Final    GRAM POSITIVE COCCI IN CLUSTERS CRITICAL RESULT CALLED TO, READ BACK BY AND VERIFIED WITH: K. Caroline Sauger [id number] 09/13/2017 T. TYSOR    Culture (A)  Final    STAPHYLOCOCCUS AUREUS SUSCEPTIBILITIES PERFORMED ON PREVIOUS CULTURE WITHIN THE LAST 5 DAYS. Performed at [facility], [place] [address], [place], [place] [address]    Report Status 09/15/2017 FINAL  Final  Culture, Urine     Status: None   Collection Time: 09/12/17 11:45 AM  Result Value Ref Range Status   Specimen Description URINE, CLEAN CATCH  Final   Special Requests NONE  Final   Culture   Final    NO GROWTH Performed at [facility], [place] [address] [place], [place] [address]    Report Status 09/13/2017 FINAL  Final  Culture, blood (routine x 2)     Status: Abnormal   Collection Time: 09/13/17  9:02 AM  Result Value Ref Range Status   Specimen Description BLOOD LEFT HAND  Final   Special Requests IN PEDIATRIC BOTTLE Blood Culture adequate volume  Final   Culture  Setup Time   Final    GRAM POSITIVE COCCI IN CLUSTERS IN PEDIATRIC  BOTTLE CRITICAL VALUE NOTED.  VALUE IS CONSISTENT WITH PREVIOUSLY REPORTED AND CALLED VALUE.    Culture (A)  Final    STAPHYLOCOCCUS AUREUS SUSCEPTIBILITIES PERFORMED ON PREVIOUS CULTURE WITHIN THE LAST 5 DAYS.    Report Status 09/17/2017 FINAL  Final  Culture, blood (routine x 2)     Status: Abnormal   Collection Time: 09/13/17  9:07 AM  Result Value Ref Range Status   Specimen Description BLOOD RIGHT HAND  Final   Special Requests IN PEDIATRIC BOTTLE Blood Culture adequate volume  Final   Culture  Setup Time   Final    GRAM POSITIVE COCCI IN CLUSTERS IN PEDIATRIC BOTTLE CRITICAL RESULT CALLED TO, READ BACK BY AND VERIFIED WITHAlona Bene PHARMD [id number] 09/14/17 A BROWNING    Culture STAPHYLOCOCCUS AUREUS (A)  Final   Report Status 09/17/2017 FINAL  Final   Organism ID, Bacteria STAPHYLOCOCCUS AUREUS  Final      Susceptibility   Staphylococcus aureus - MIC*    CIPROFLOXACIN <=0.5  SENSITIVE Sensitive     ERYTHROMYCIN >=8 RESISTANT Resistant     GENTAMICIN <=0.5 SENSITIVE Sensitive     OXACILLIN 0.5 SENSITIVE Sensitive     TETRACYCLINE <=1 SENSITIVE Sensitive     VANCOMYCIN 1 SENSITIVE Sensitive     TRIMETH/SULFA <=10 SENSITIVE Sensitive     CLINDAMYCIN <=0.25 SENSITIVE Sensitive     RIFAMPIN <=0.5 SENSITIVE Sensitive     Inducible Clindamycin NEGATIVE Sensitive     * STAPHYLOCOCCUS AUREUS  MRSA PCR Screening     Status: None   Collection Time: 09/15/17 10:46 AM  Result Value Ref Range Status   MRSA by PCR NEGATIVE NEGATIVE Final    Comment:        The GeneXpert MRSA Assay (FDA approved for NASAL specimens only), is one component of a comprehensive MRSA colonization surveillance program. It is not intended to diagnose MRSA infection nor to guide or monitor treatment for MRSA infections.   Aerobic/Anaerobic Culture (surgical/deep wound)     Status: None (Preliminary result)   Collection Time: 09/15/17 12:33 PM  Result Value Ref Range Status   Specimen Description ABSCESS  PARASPINAL  Final   Special Requests NONE  Final   Gram Stain   Final    RARE WBC PRESENT, PREDOMINANTLY PMN FEW GRAM POSITIVE COCCI IN CLUSTERS    Culture   Final    FEW STAPHYLOCOCCUS AUREUS NO ANAEROBES ISOLATED; CULTURE IN PROGRESS FOR 5 DAYS    Report Status PENDING  Incomplete   Organism ID, Bacteria STAPHYLOCOCCUS AUREUS  Final      Susceptibility   Staphylococcus aureus - MIC*    CIPROFLOXACIN <=0.5 SENSITIVE Sensitive     ERYTHROMYCIN >=8 RESISTANT Resistant     GENTAMICIN <=0.5 SENSITIVE Sensitive     OXACILLIN <=0.25 SENSITIVE Sensitive     TETRACYCLINE <=1 SENSITIVE Sensitive     VANCOMYCIN <=0.5 SENSITIVE Sensitive     TRIMETH/SULFA <=10 SENSITIVE Sensitive     CLINDAMYCIN <=0.25 SENSITIVE Sensitive     RIFAMPIN <=0.5 SENSITIVE Sensitive     Inducible Clindamycin NEGATIVE Sensitive     * FEW STAPHYLOCOCCUS AUREUS  Aerobic/Anaerobic Culture (surgical/deep wound)     Status: None (Preliminary result)   Collection Time: 09/15/17 12:41 PM  Result Value Ref Range Status   Specimen Description ABSCESS SPINAL EPIDURAL  Final   Special Requests NONE  Final   Gram Stain   Final    RARE WBC PRESENT, PREDOMINANTLY PMN FEW GRAM POSITIVE COCCI IN CLUSTERS    Culture   Final    MODERATE STAPHYLOCOCCUS AUREUS NO ANAEROBES ISOLATED; CULTURE IN PROGRESS FOR 5 DAYS    Report Status PENDING  Incomplete   Organism ID, Bacteria STAPHYLOCOCCUS AUREUS  Final      Susceptibility   Staphylococcus aureus - MIC*    CIPROFLOXACIN <=0.5 SENSITIVE Sensitive     ERYTHROMYCIN >=8 RESISTANT Resistant     GENTAMICIN <=0.5 SENSITIVE Sensitive     OXACILLIN 0.5 SENSITIVE Sensitive     TETRACYCLINE <=1 SENSITIVE Sensitive     VANCOMYCIN 1 SENSITIVE Sensitive     TRIMETH/SULFA <=10 SENSITIVE Sensitive     CLINDAMYCIN <=0.25 SENSITIVE Sensitive     RIFAMPIN <=0.5 SENSITIVE Sensitive     Inducible Clindamycin NEGATIVE Sensitive     * MODERATE STAPHYLOCOCCUS AUREUS  Culture, blood (Routine X  2) w Reflex to ID Panel     Status: None (Preliminary result)   Collection Time: 09/17/17  7:53 AM  Result Value Ref Range Status   Specimen  Description BLOOD RIGHT HAND  Final   Special Requests IN PEDIATRIC BOTTLE Blood Culture adequate volume  Final   Culture  Setup Time   Final    GRAM POSITIVE COCCI AEROBIC BOTTLE ONLY CRITICAL VALUE NOTED.  VALUE IS CONSISTENT WITH PREVIOUSLY REPORTED AND CALLED VALUE.    Culture GRAM POSITIVE COCCI  Final   Report Status PENDING  Incomplete     Scheduled Meds: . celecoxib  200 mg Oral BID  . Chlorhexidine Gluconate Cloth  6 each Topical Q2200  . docusate sodium  100 mg Oral BID  . polyethylene glycol  17 g Oral Daily  . rifampin  300 mg Oral Q12H  . sodium chloride flush  10-40 mL Intracatheter Q12H     LOS: 7 days   Cherene Altes, MD [facility] Office  [phone] Pager - Text Page per Amion as per below:  On-Call/Text Page:      [email or web address]      password [email or web address]  If 7PM-7AM, please contact night-coverage [email or web address] Password [email or web address] 09/18/2017, 4:08 PM

## 2017-09-18 NOTE — Progress Notes (Signed)
1900: Received handoff report from RN. Pt found to be in severe pain, c/o poor pain management throughout the day. Developed a plan with the pt for pain mgmt overnight. Pt amenable to plan.  2200: Pt reports being able to get some rest between available PRN pain med administration.  0000: Pt endorses chewing oxy IR in hopes it will act faster. Pt educated on IR medication pharmacokinetics.  0500: C-collar applied. Pt ambulates well with minimal distress. Is able to sit up to get out of bed, sit down and twist to get back into bed. Pt reminded of importance of early mobility for recovery. Polyuria this am: 2871mL since 0000.  0700: Handoff report given to RN.

## 2017-09-18 NOTE — Progress Notes (Signed)
INFECTIOUS DISEASE PROGRESS NOTE  ID: [name] is a 51 y.o. 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 cervical spine  Subjective: Continues to complain of neck pain  Abtx:  Anti-infectives (From admission, onward)   Start     Dose/Rate Route Frequency Ordered Stop   09/16/17 2300  nafcillin 2 g in dextrose 5 % 100 mL IVPB  Status:  Discontinued     2 g 200 mL/hr over 30 Minutes Intravenous Every 4 hours 09/15/17 2035 09/15/17 2303   09/16/17 1400  rifampin (RIFADIN) capsule 300 mg     300 mg Oral Every 12 hours 09/16/17 1344     09/15/17 2315  nafcillin 2 g in dextrose 5 % 100 mL IVPB     2 g 200 mL/hr over 30 Minutes Intravenous Every 4 hours 09/15/17 2303     09/15/17 1237  bacitracin 50,000 Units in sodium chloride irrigation 0.9 % 500 mL irrigation  Status:  Discontinued       As needed 09/15/17 1237 09/15/17 1433   09/13/17 1200  nafcillin 2 g in dextrose 5 % 100 mL IVPB  Status:  Discontinued     2 g 200 mL/hr over 30 Minutes Intravenous Every 4 hours 09/13/17 0918 09/15/17 2035   09/12/17 1400  ceFAZolin (ANCEF) IVPB 2g/100 mL premix  Status:  Discontinued     2 g 200 mL/hr over 30 Minutes Intravenous Every 8 hours 09/12/17 0848 09/13/17 0918   09/12/17 1000  vancomycin (VANCOCIN) 1,250 mg in sodium chloride 0.9 % 250 mL IVPB  Status:  Discontinued     1,250 mg 166.7 mL/hr over 90 Minutes Intravenous Every 12 hours 09/11/17 1904 09/12/17 0828   09/12/17 0600  ceFEPIme (MAXIPIME) 1 g in dextrose 5 % 50 mL IVPB  Status:  Discontinued     1 g 100 mL/hr over 30 Minutes Intravenous Every 8 hours 09/11/17 1904 09/12/17 0827   09/11/17 1830   vancomycin (VANCOCIN) 2,000 mg in sodium chloride 0.9 % 500 mL IVPB     2,000 mg 250 mL/hr over 120 Minutes Intravenous  Once 09/11/17 1758 09/12/17 0026   09/11/17 1815  ceFEPIme (MAXIPIME) 2 g in dextrose 5 % 50 mL IVPB     2 g 100 mL/hr over 30 Minutes Intravenous  Once 09/11/17 1809 09/11/17 2208   09/11/17 1800  piperacillin-tazobactam (ZOSYN) IVPB 3.375 g  Status:  Discontinued     3.375 g 12.5 mL/hr over 240 Minutes Intravenous Every 8 hours 09/11/17 1757 09/11/17 1808      Medications:  Scheduled: . celecoxib  200 mg Oral BID  . Chlorhexidine Gluconate Cloth  6 each Topical Q2200  . docusate sodium  100 mg Oral BID  . polyethylene glycol  17 g Oral Daily  . rifampin  300 mg Oral Q12H  . sodium chloride flush  10-40 mL Intracatheter Q12H    Objective: Vital signs in last 24 hours: Temp:  [98 F (36.7 C)-98.6 F (37 C)] 98 F (36.7 C) (11/24 1238) Pulse Rate:  [67-90] 72 (11/24 1238) Resp:  [16-21] 17 (11/24 1238) BP: (  133-151)/(72-97) 151/92 (11/24 1238) SpO2:  [94 %-100 %] 98 % (11/24 1238)   General appearance: alert, cooperative and mild distress Neck: posterior drain.  Resp: clear to auscultation bilaterally Cardio: regular rate and rhythm GI: normal findings: bowel sounds normal and soft, non-tender  Lab Results Recent Labs    09/16/17 0431  WBC 15.0*  HGB 10.6*  HCT 31.5*  NA 137  K 3.8  CL 101  CO2 29  BUN 8  CREATININE 0.63   Liver Panel Recent Labs    09/15/17 1822 09/16/17 0431  PROT  --  5.2*  ALBUMIN  --  1.7*  AST  --  24  ALT 15 12*  ALKPHOS  --  621*  BILITOT  --  3.4*  BILIDIR 3.1*  --    Sedimentation Rate No results for input(s): ESRSEDRATE in the last 72 hours. C-Reactive Protein No results for input(s): CRP in the last 72 hours.  Microbiology: Recent Results (from the past 240 hour(s))  Urine culture     Status: Abnormal   Collection Time: 09/10/17 12:57 AM  Result Value Ref Range Status   Specimen Description  URINE, CLEAN CATCH  Final   Special Requests NONE  Final   Culture MULTIPLE SPECIES PRESENT, SUGGEST RECOLLECTION (A)  Final   Report Status 09/11/2017 FINAL  Final  Wet prep, genital     Status: Abnormal   Collection Time: 09/10/17  4:20 AM  Result Value Ref Range Status   Yeast Wet Prep HPF POC NONE SEEN NONE SEEN Final   Trich, Wet Prep NONE SEEN NONE SEEN Final   Clue Cells Wet Prep HPF POC NONE SEEN NONE SEEN Final   WBC, Wet Prep HPF POC FEW (A) NONE SEEN Final   Sperm NONE SEEN  Final  Blood culture (routine x 2)     Status: Abnormal   Collection Time: 09/11/17  9:20 AM  Result Value Ref Range Status   Specimen Description BLOOD RIGHT ANTECUBITAL  Final   Special Requests   Final    BOTTLES DRAWN AEROBIC AND ANAEROBIC Blood Culture adequate volume   Culture  Setup Time   Final    GRAM POSITIVE COCCI IN CLUSTERS IN BOTH AEROBIC AND ANAEROBIC BOTTLES CRITICAL RESULT CALLED TO, READ BACK BY AND VERIFIED WITH: Novella Rob [id number] 09/12/2017 Mena Goes Performed at [facility], [address], [place], [place] [address]    Culture STAPHYLOCOCCUS AUREUS (A)  Final   Report Status 09/14/2017 FINAL  Final   Organism ID, Bacteria STAPHYLOCOCCUS AUREUS  Final      Susceptibility   Staphylococcus aureus - MIC*    CIPROFLOXACIN <=0.5 SENSITIVE Sensitive     ERYTHROMYCIN >=8 RESISTANT Resistant     GENTAMICIN <=0.5 SENSITIVE Sensitive     OXACILLIN 0.5 SENSITIVE Sensitive     TETRACYCLINE <=1 SENSITIVE Sensitive     VANCOMYCIN <=0.5 SENSITIVE Sensitive     TRIMETH/SULFA <=10 SENSITIVE Sensitive     CLINDAMYCIN <=0.25 SENSITIVE Sensitive     RIFAMPIN <=0.5 SENSITIVE Sensitive     Inducible Clindamycin NEGATIVE Sensitive     * STAPHYLOCOCCUS AUREUS  Blood Culture ID Panel (Reflexed)     Status: Abnormal   Collection Time: 09/11/17  9:20 AM  Result Value Ref Range Status   Enterococcus species NOT DETECTED NOT DETECTED Final   Listeria monocytogenes NOT DETECTED NOT DETECTED  Final   Staphylococcus species DETECTED (A) NOT DETECTED Final    Comment: CRITICAL RESULT CALLED TO, READ BACK  BY AND VERIFIED WITH: B. GREEN,PHARMD [id number] 09/12/2017 T. TYSOR    Staphylococcus aureus DETECTED (A) NOT DETECTED Final    Comment: Methicillin (oxacillin) susceptible Staphylococcus aureus (MSSA). Preferred therapy is anti staphylococcal beta lactam antibiotic (Cefazolin or Nafcillin), unless clinically contraindicated. CRITICAL RESULT CALLED TO, READ BACK BY AND VERIFIED WITH: B. GREEN,PHARMD [id number] 09/12/2017 T. TYSOR    Methicillin resistance NOT DETECTED NOT DETECTED Final   Streptococcus species NOT DETECTED NOT DETECTED Final   Streptococcus agalactiae NOT DETECTED NOT DETECTED Final   Streptococcus pneumoniae NOT DETECTED NOT DETECTED Final   Streptococcus pyogenes NOT DETECTED NOT DETECTED Final   Acinetobacter baumannii NOT DETECTED NOT DETECTED Final   Enterobacteriaceae species NOT DETECTED NOT DETECTED Final   Enterobacter cloacae complex NOT DETECTED NOT DETECTED Final   Escherichia coli NOT DETECTED NOT DETECTED Final   Klebsiella oxytoca NOT DETECTED NOT DETECTED Final   Klebsiella pneumoniae NOT DETECTED NOT DETECTED Final   Proteus species NOT DETECTED NOT DETECTED Final   Serratia marcescens NOT DETECTED NOT DETECTED Final   Haemophilus influenzae NOT DETECTED NOT DETECTED Final   Neisseria meningitidis NOT DETECTED NOT DETECTED Final   Pseudomonas aeruginosa NOT DETECTED NOT DETECTED Final   Candida albicans NOT DETECTED NOT DETECTED Final   Candida glabrata NOT DETECTED NOT DETECTED Final   Candida krusei NOT DETECTED NOT DETECTED Final   Candida parapsilosis NOT DETECTED NOT DETECTED Final   Candida tropicalis NOT DETECTED NOT DETECTED Final    Comment: Performed at [facility], [address] [place], [place] [address]  Blood culture (routine x 2)     Status: Abnormal   Collection Time: 09/11/17  9:33 AM  Result Value Ref Range Status    Specimen Description BLOOD BLOOD LEFT HAND  Final   Special Requests   Final    BOTTLES DRAWN AEROBIC AND ANAEROBIC Blood Culture adequate volume   Culture  Setup Time   Final    GRAM POSITIVE COCCI IN CLUSTERS IN BOTH AEROBIC AND ANAEROBIC BOTTLES CRITICAL VALUE NOTED.  VALUE IS CONSISTENT WITH PREVIOUSLY REPORTED AND CALLED VALUE.    Culture (A)  Final    STAPHYLOCOCCUS AUREUS SUSCEPTIBILITIES PERFORMED ON PREVIOUS CULTURE WITHIN THE LAST 5 DAYS. Performed at [facility], [place] [address] [place], [place] [address]    Report Status 09/14/2017 FINAL  Final  MRSA PCR Screening     Status: None   Collection Time: 09/11/17  1:03 PM  Result Value Ref Range Status   MRSA by PCR NEGATIVE NEGATIVE Final    Comment:        The GeneXpert MRSA Assay (FDA approved for NASAL specimens only), is one component of a comprehensive MRSA colonization surveillance program. It is not intended to diagnose MRSA infection nor to guide or monitor treatment for MRSA infections.   Culture, blood (Routine X 2) w Reflex to ID Panel     Status: Abnormal   Collection Time: 09/12/17  9:00 AM  Result Value Ref Range Status   Specimen Description BLOOD RIGHT HAND  Final   Special Requests IN PEDIATRIC BOTTLE Blood Culture adequate volume  Final   Culture  Setup Time   Final    GRAM POSITIVE COCCI IN CLUSTERS IN PEDIATRIC BOTTLE CRITICAL RESULT CALLED TO, READ BACK BY AND VERIFIED WITH: K. Caroline Sauger [id number] 09/13/2017 T. TYSOR    Culture (A)  Final    STAPHYLOCOCCUS AUREUS SUSCEPTIBILITIES PERFORMED ON PREVIOUS CULTURE WITHIN THE LAST 5 DAYS. Performed at [facility], [place] [address], [place], [place] [address]    Report Status 09/15/2017 FINAL  Final  Culture, blood (Routine X 2) w Reflex to ID Panel     Status: Abnormal   Collection Time: 09/12/17  9:15 AM  Result Value Ref Range Status   Specimen Description BLOOD BLOOD RIGHT HAND  Final   Special Requests IN PEDIATRIC BOTTLE Blood  Culture adequate volume  Final   Culture  Setup Time   Final    GRAM POSITIVE COCCI IN CLUSTERS CRITICAL RESULT CALLED TO, READ BACK BY AND VERIFIED WITH: K. Caroline Sauger [id number] 09/13/2017 T. TYSOR    Culture (A)  Final    STAPHYLOCOCCUS AUREUS SUSCEPTIBILITIES PERFORMED ON PREVIOUS CULTURE WITHIN THE LAST 5 DAYS. Performed at [facility], [place] [address] [place], [place] [address]    Report Status 09/15/2017 FINAL  Final  Culture, Urine     Status: None   Collection Time: 09/12/17 11:45 AM  Result Value Ref Range Status   Specimen Description URINE, CLEAN CATCH  Final   Special Requests NONE  Final   Culture   Final    NO GROWTH Performed at [facility], [place] [address], [place], [place] [address]    Report Status 09/13/2017 FINAL  Final  Culture, blood (routine x 2)     Status: Abnormal   Collection Time: 09/13/17  9:02 AM  Result Value Ref Range Status   Specimen Description BLOOD LEFT HAND  Final   Special Requests IN PEDIATRIC BOTTLE Blood Culture adequate volume  Final   Culture  Setup Time   Final    GRAM POSITIVE COCCI IN CLUSTERS IN PEDIATRIC BOTTLE CRITICAL VALUE NOTED.  VALUE IS CONSISTENT WITH PREVIOUSLY REPORTED AND CALLED VALUE.    Culture (A)  Final    STAPHYLOCOCCUS AUREUS SUSCEPTIBILITIES PERFORMED ON PREVIOUS CULTURE WITHIN THE LAST 5 DAYS.    Report Status 09/17/2017 FINAL  Final  Culture, blood (routine x 2)     Status: Abnormal   Collection Time: 09/13/17  9:07 AM  Result Value Ref Range Status   Specimen Description BLOOD RIGHT HAND  Final   Special Requests IN PEDIATRIC BOTTLE Blood Culture adequate volume  Final   Culture  Setup Time   Final    GRAM POSITIVE COCCI IN CLUSTERS IN PEDIATRIC BOTTLE CRITICAL RESULT CALLED TO, READ BACK BY AND VERIFIED WITHAlona Bene PHARMD [id number] 09/14/17 A BROWNING    Culture STAPHYLOCOCCUS AUREUS (A)  Final   Report Status 09/17/2017 FINAL  Final   Organism ID, Bacteria STAPHYLOCOCCUS AUREUS  Final       Susceptibility   Staphylococcus aureus - MIC*    CIPROFLOXACIN <=0.5 SENSITIVE Sensitive     ERYTHROMYCIN >=8 RESISTANT Resistant     GENTAMICIN <=0.5 SENSITIVE Sensitive     OXACILLIN 0.5 SENSITIVE Sensitive     TETRACYCLINE <=1 SENSITIVE Sensitive     VANCOMYCIN 1 SENSITIVE Sensitive     TRIMETH/SULFA <=10 SENSITIVE Sensitive     CLINDAMYCIN <=0.25 SENSITIVE Sensitive     RIFAMPIN <=0.5 SENSITIVE Sensitive     Inducible Clindamycin NEGATIVE Sensitive     * STAPHYLOCOCCUS AUREUS  MRSA PCR Screening     Status: None   Collection Time: 09/15/17 10:46 AM  Result Value Ref Range Status   MRSA by PCR NEGATIVE NEGATIVE Final    Comment:        The GeneXpert MRSA Assay (FDA approved for NASAL specimens only), is one component of a  comprehensive MRSA colonization surveillance program. It is not intended to diagnose MRSA infection nor to guide or monitor treatment for MRSA infections.   Aerobic/Anaerobic Culture (surgical/deep wound)     Status: None (Preliminary result)   Collection Time: 09/15/17 12:33 PM  Result Value Ref Range Status   Specimen Description ABSCESS PARASPINAL  Final   Special Requests NONE  Final   Gram Stain   Final    RARE WBC PRESENT, PREDOMINANTLY PMN FEW GRAM POSITIVE COCCI IN CLUSTERS    Culture   Final    FEW STAPHYLOCOCCUS AUREUS NO ANAEROBES ISOLATED; CULTURE IN PROGRESS FOR 5 DAYS    Report Status PENDING  Incomplete   Organism ID, Bacteria STAPHYLOCOCCUS AUREUS  Final      Susceptibility   Staphylococcus aureus - MIC*    CIPROFLOXACIN <=0.5 SENSITIVE Sensitive     ERYTHROMYCIN >=8 RESISTANT Resistant     GENTAMICIN <=0.5 SENSITIVE Sensitive     OXACILLIN <=0.25 SENSITIVE Sensitive     TETRACYCLINE <=1 SENSITIVE Sensitive     VANCOMYCIN <=0.5 SENSITIVE Sensitive     TRIMETH/SULFA <=10 SENSITIVE Sensitive     CLINDAMYCIN <=0.25 SENSITIVE Sensitive     RIFAMPIN <=0.5 SENSITIVE Sensitive     Inducible Clindamycin NEGATIVE Sensitive     * FEW  STAPHYLOCOCCUS AUREUS  Aerobic/Anaerobic Culture (surgical/deep wound)     Status: None (Preliminary result)   Collection Time: 09/15/17 12:41 PM  Result Value Ref Range Status   Specimen Description ABSCESS SPINAL EPIDURAL  Final   Special Requests NONE  Final   Gram Stain   Final    RARE WBC PRESENT, PREDOMINANTLY PMN FEW GRAM POSITIVE COCCI IN CLUSTERS    Culture   Final    MODERATE STAPHYLOCOCCUS AUREUS NO ANAEROBES ISOLATED; CULTURE IN PROGRESS FOR 5 DAYS    Report Status PENDING  Incomplete   Organism ID, Bacteria STAPHYLOCOCCUS AUREUS  Final      Susceptibility   Staphylococcus aureus - MIC*    CIPROFLOXACIN <=0.5 SENSITIVE Sensitive     ERYTHROMYCIN >=8 RESISTANT Resistant     GENTAMICIN <=0.5 SENSITIVE Sensitive     OXACILLIN 0.5 SENSITIVE Sensitive     TETRACYCLINE <=1 SENSITIVE Sensitive     VANCOMYCIN 1 SENSITIVE Sensitive     TRIMETH/SULFA <=10 SENSITIVE Sensitive     CLINDAMYCIN <=0.25 SENSITIVE Sensitive     RIFAMPIN <=0.5 SENSITIVE Sensitive     Inducible Clindamycin NEGATIVE Sensitive     * MODERATE STAPHYLOCOCCUS AUREUS  Culture, blood (Routine X 2) w Reflex to ID Panel     Status: None (Preliminary result)   Collection Time: 09/17/17  7:53 AM  Result Value Ref Range Status   Specimen Description BLOOD RIGHT HAND  Final   Special Requests IN PEDIATRIC BOTTLE Blood Culture adequate volume  Final   Culture  Setup Time   Final    GRAM POSITIVE COCCI AEROBIC BOTTLE ONLY CRITICAL VALUE NOTED.  VALUE IS CONSISTENT WITH PREVIOUSLY REPORTED AND CALLED VALUE.    Culture GRAM POSITIVE COCCI  Final   Report Status PENDING  Incomplete    Studies/Results: No results found.   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 Available 11-25 as needed. Dr [name] will return on 11-26         Bobby Rumpf MD, FACP Infectious Diseases (pager) [phone] [email or web address] 09/18/2017, 12:59 PM  LOS: 7 days

## 2017-09-19 LAB — BASIC METABOLIC PANEL
Anion gap: 6 (ref 5–15)
BUN: 5 mg/dL — AB (ref 6–20)
CALCIUM: 7.5 mg/dL — AB (ref 8.9–10.3)
CHLORIDE: 97 mmol/L — AB (ref 101–111)
CO2: 27 mmol/L (ref 22–32)
CREATININE: 0.57 mg/dL (ref 0.44–1.00)
GFR calc Af Amer: >60 mL/min (ref >60)
GFR calc non Af Amer: >60 mL/min (ref >60)
GLUCOSE: 101 mg/dL — AB (ref 65–99)
Potassium: 3.2 mmol/L — ABNORMAL LOW (ref 3.5–5.1)
Sodium: 130 mmol/L — ABNORMAL LOW (ref 135–145)

## 2017-09-19 LAB — COMPREHENSIVE METABOLIC PANEL
ALBUMIN: 1.7 g/dL — AB (ref 3.5–5.0)
ALT: 13 U/L — ABNORMAL LOW (ref 14–54)
AST: 22 U/L (ref 15–41)
Alkaline Phosphatase: 371 U/L — ABNORMAL HIGH (ref 38–126)
Anion gap: 7 (ref 5–15)
BILIRUBIN TOTAL: 2.3 mg/dL — AB (ref 0.3–1.2)
BUN: 5 mg/dL — AB (ref 6–20)
CHLORIDE: 98 mmol/L — AB (ref 101–111)
CO2: 28 mmol/L (ref 22–32)
Calcium: 7.6 mg/dL — ABNORMAL LOW (ref 8.9–10.3)
Creatinine, Ser: 0.57 mg/dL (ref 0.44–1.00)
GFR calc Af Amer: >60 mL/min (ref >60)
GFR calc non Af Amer: >60 mL/min (ref >60)
GLUCOSE: 112 mg/dL — AB (ref 65–99)
POTASSIUM: 3 mmol/L — AB (ref 3.5–5.1)
SODIUM: 133 mmol/L — AB (ref 135–145)
TOTAL PROTEIN: 5.7 g/dL — AB (ref 6.5–8.1)

## 2017-09-19 LAB — CBC
HEMATOCRIT: 29.9 % — AB (ref 36.0–46.0)
HEMOGLOBIN: 10 g/dL — AB (ref 12.0–15.0)
MCH: 29.9 pg (ref 26.0–34.0)
MCHC: 33.4 g/dL (ref 30.0–36.0)
MCV: 89.5 fL (ref 78.0–100.0)
Platelets: 377 10*3/uL (ref 150–400)
RBC: 3.34 MIL/uL — ABNORMAL LOW (ref 3.87–5.11)
RDW: 15.4 % (ref 11.5–15.5)
WBC: 16.9 10*3/uL — ABNORMAL HIGH (ref 4.0–10.5)

## 2017-09-19 MED ORDER — KETOROLAC TROMETHAMINE 30 MG/ML IJ SOLN
30.0000 mg | Freq: Three times a day (TID) | INTRAMUSCULAR | Status: AC | PRN
  Administered 2017-09-19 – 2017-09-20 (×4): 30 mg via INTRAVENOUS
  Filled 2017-09-19 (×6): qty 1

## 2017-09-19 MED ORDER — OXYCODONE HCL 5 MG PO TABS
5.0000 mg | ORAL_TABLET | Freq: Four times a day (QID) | ORAL | Status: DC | PRN
  Administered 2017-09-19 – 2017-09-21 (×7): 10 mg via ORAL
  Filled 2017-09-19 (×7): qty 2

## 2017-09-19 MED ORDER — POTASSIUM CHLORIDE CRYS ER 20 MEQ PO TBCR
40.0000 meq | EXTENDED_RELEASE_TABLET | Freq: Two times a day (BID) | ORAL | Status: AC
  Administered 2017-09-19 – 2017-09-20 (×3): 40 meq via ORAL
  Filled 2017-09-19 (×4): qty 2

## 2017-09-19 MED ORDER — HYDROMORPHONE HCL 1 MG/ML IJ SOLN
1.0000 mg | INTRAMUSCULAR | Status: DC | PRN
  Administered 2017-09-19 – 2017-09-21 (×9): 1 mg via INTRAVENOUS
  Filled 2017-09-19 (×9): qty 1

## 2017-09-19 NOTE — Progress Notes (Signed)
1900: Received handoff report from RN. Pt still with poorly controlled pain. Reports brief relief from added toradol. Developed a plan with the pt for pain mgmt overnight. Pt amenable to plan.  2200: Pt continues with polyuria, despite KVO fluids and moderate po intake. Urine is colored from rifampin, so it is difficult to assess color for dilution. No s/s dehydration. Sodium 133 and creatinine 0.57 this morning (09/19/17 0324). UOP 09/17/17 = 4 L UOP 09/18/17 = 5.5 L UOP 09/19/17 day shift total = 3.5 L [facility] MD [facility] consulted. STAT BMET ordered.  2300: BMET results include Na 130, creatinine 0.57, glucose 101. MD aware; no new orders.  0000: Pt c/o throbbing/pulsing neck after heating pads were on. Recommended switching back to cold therapy.   0400: Pt resting comfortably.  0600: Pt reports having had a difficult night. She does not want to ambulate this morning, and feels that her poor pain control overnight is due to increased activity during the day.  She had much better relief with cold therapy than hot. Hemovac line is clotted off and no longer draining. Nsgy MD aware; will assess during rounds.   0700: Handoff report given to RN.

## 2017-09-19 NOTE — Progress Notes (Signed)
Patient ID: [name], female   DOB: [birth year], 51 y.o.   MRN: [id number] Subjective: Patient reports significant neck pain. No arm pain or numbness tingling or weakness. Still walking fairly often.  Objective: Vital signs in last 24 hours: Temp:  [98 F (36.7 C)-98.6 F (37 C)] 98.2 F (36.8 C) (11/25 0335) Pulse Rate:  [69-81] 70 (11/25 0400) Resp:  [15-20] 16 (11/25 0400) BP: (130-155)/(77-100) 137/86 (11/25 0335) SpO2:  [94 %-100 %] 97 % (11/25 0400)  Intake/Output from previous day: 11/24 0701 - 11/25 0700 In: 1482 [P.O.:702; I.V.:180; IV Piggyback:600] Out: 4854 [Urine:5350; Drains:90] Intake/Output this shift: No intake/output data recorded.  Neurologic: Grossly normal with good strength in all 4 extremities  Lab Results: Lab Results  Component Value Date   WBC 16.9 (H) 09/19/2017   HGB 10.0 (L) 09/19/2017   HCT 29.9 (L) 09/19/2017   MCV 89.5 09/19/2017   PLT 377 09/19/2017   Lab Results  Component Value Date   INR 1.19 09/15/2017   BMET Lab Results  Component Value Date   NA 133 (L) 09/19/2017   K 3.0 (L) 09/19/2017   CL 98 (L) 09/19/2017   CO2 28 09/19/2017   GLUCOSE 112 (H) 09/19/2017   BUN 5 (L) 09/19/2017   CREATININE 0.57 09/19/2017   CALCIUM 7.6 (L) 09/19/2017    Studies/Results: No results found.  Assessment/Plan: Stable. Neurologic exam normal. Continue pain control   LOS: 8 days    Camiya Vinal S 09/19/2017, 8:33 AM

## 2017-09-19 NOTE — Progress Notes (Signed)
[place] TEAM 1 - Stepdown/ICU TEAM  [name]  CHY:[id number] DOB: [birth year] DOA: 09/11/2017 PCP: Marliss Coots, NP    Brief Narrative:  51 year old female w/ a history of IV drug use and chronic back pain status post previous back surgery who presented to the ED with severe back and neck pain.  An MRI of the T and L-spine revealed a ventral epidural abscess.  Patient was also noted to have MSSA bacteremia.    Patient was placed empirically on IV antibiotics.  ID and neurosurgery consulted.  Patient was subsequently transferred to [facility].  Significant Events: 11/17 admit to [facility]  11/18 transfer to [facility] 11/21 developed urinary retention - emergent C3-C7 laminectomy for decompression of spinal cord and debridement of epidural abscess  Subjective: Resting comfortably at the time of my visit.  Did not awaken to my exam.    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   DVT prophylaxis: SCDs Code Status: FULL CODE Family Communication: no family present at time of exam  Disposition Plan: change status tele - PT/OT - arrange TEE   Consultants:  Heme/Onc ID NS  Antimicrobials:  Nafcillin 11/19 > Rifampin 11/22 > Cefazolin 11/18 > 11/19 Cefepime 09/11/2017 > 09/12/2017 Vancomycin 11/17  Objective: Blood pressure 139/89, pulse 79, temperature 97.9  F (36.6 C), temperature source Oral, resp. rate 18, height 5\' 6"  (1.676 m), weight 115.8 kg (255 lb 4.7 oz), SpO2 96 %.  Intake/Output Summary (Last 24 hours) at 09/19/2017 1656 Last data filed at 09/19/2017 1400 Gross per 24 hour  Intake 1930 ml  Output 6000 ml  Net -4070 ml   Filed Weights   09/12/17 1319 09/13/17 0730 09/14/17 0831  Weight: 115.8 kg (255 lb 4.7 oz) 117.4 kg (258 lb 13.1 oz) 115.8 kg (255 lb 4.7 oz)    Examination: General: No acute respiratory distress Lungs: CTA B  Cardiovascular: Regular rate and rhythm  CBC: Recent Labs  Lab 09/13/17 0347 09/14/17 0401 09/15/17 0557 09/16/17 0431 09/19/17 0324  WBC 19.1* 19.0* 21.1* 15.0* 16.9*  NEUTROABS 15.3*  --   --   --   --   HGB 13.1 12.2 11.7* 10.6* 10.0*  HCT 38.1 37.4 35.3* 31.5* 29.9*  MCV 90.1 90.8 91.7 89.7 89.5  PLT 312 307 362 350 878   Basic Metabolic Panel: Recent Labs  Lab 09/13/17 0347 09/14/17 0401 09/15/17 0557 09/16/17 0431 09/19/17 0324  NA 137 137 138 137 133*  K 3.6 3.3* 3.9 3.8 3.0*  CL 104 102 101 101 98*  CO2 27 28 28 29 28   GLUCOSE 89 93 77 128* 112*  BUN <5* 6 7 8  5*  CREATININE 0.67 0.71 0.76 0.63 0.57  CALCIUM 7.7* 7.6* 7.8* 7.4* 7.6*  MG 2.0 1.9  --   --   --   PHOS 3.7  5.0*  --   --   --    GFR: Estimated Creatinine Clearance: 111.2 mL/min (by C-G formula based on SCr of 0.57 mg/dL).  Liver Function Tests: Recent Labs  Lab 09/13/17 0347 09/14/17 0401 09/15/17 1822 09/16/17 0431 09/19/17 0324  AST 41 36  --  24 22  ALT 19 16 15  12* 13*  ALKPHOS 373* 513*  --  621* 371*  BILITOT 2.0* 3.5*  --  3.4* 2.3*  PROT 5.6* 5.2*  --  5.2* 5.7*  ALBUMIN 2.1* 1.9*  --  1.7* 1.7*    Coagulation Profile: Recent Labs  Lab 09/13/17 0347 09/15/17 0803  INR 1.25 1.19    HbA1C: Hgb A1c MFr Bld  Date/Time Value Ref Range Status  04/11/2017 06:20 PM 5.9 (H) 4.8 - 5.6 % Final    Comment:    (NOTE)         Pre-diabetes: 5.7 - 6.4         Diabetes: >6.4          Glycemic control for adults with diabetes: <7.0      Recent Results (from the past 240 hour(s))  Urine culture     Status: Abnormal   Collection Time: 09/10/17 12:57 AM  Result Value Ref Range Status   Specimen Description URINE, CLEAN CATCH  Final   Special Requests NONE  Final   Culture MULTIPLE SPECIES PRESENT, SUGGEST RECOLLECTION (A)  Final   Report Status 09/11/2017 FINAL  Final  Wet prep, genital     Status: Abnormal   Collection Time: 09/10/17  4:20 AM  Result Value Ref Range Status   Yeast Wet Prep HPF POC NONE SEEN NONE SEEN Final   Trich, Wet Prep NONE SEEN NONE SEEN Final   Clue Cells Wet Prep HPF POC NONE SEEN NONE SEEN Final   WBC, Wet Prep HPF POC FEW (A) NONE SEEN Final   Sperm NONE SEEN  Final  Blood culture (routine x 2)     Status: Abnormal   Collection Time: 09/11/17  9:20 AM  Result Value Ref Range Status   Specimen Description BLOOD RIGHT ANTECUBITAL  Final   Special Requests   Final    BOTTLES DRAWN AEROBIC AND ANAEROBIC Blood Culture adequate volume   Culture  Setup Time   Final    GRAM POSITIVE COCCI IN CLUSTERS IN BOTH AEROBIC AND ANAEROBIC BOTTLES CRITICAL RESULT CALLED TO, READ BACK BY AND VERIFIED WITH: Novella Rob [id number] 09/12/2017 Mena Goes Performed at [facility], [address] [place], [place] [address]    Culture STAPHYLOCOCCUS AUREUS (A)  Final   Report Status 09/14/2017 FINAL  Final   Organism ID, Bacteria STAPHYLOCOCCUS AUREUS  Final      Susceptibility   Staphylococcus aureus - MIC*    CIPROFLOXACIN <=0.5 SENSITIVE Sensitive     ERYTHROMYCIN >=8 RESISTANT Resistant     GENTAMICIN <=0.5 SENSITIVE Sensitive     OXACILLIN 0.5 SENSITIVE Sensitive     TETRACYCLINE <=1 SENSITIVE Sensitive     VANCOMYCIN <=0.5 SENSITIVE Sensitive     TRIMETH/SULFA <=10 SENSITIVE Sensitive     CLINDAMYCIN <=0.25 SENSITIVE Sensitive     RIFAMPIN <=0.5 SENSITIVE Sensitive     Inducible Clindamycin NEGATIVE Sensitive     * STAPHYLOCOCCUS AUREUS    Blood Culture ID Panel (Reflexed)     Status: Abnormal   Collection Time: 09/11/17  9:20 AM  Result Value Ref Range Status   Enterococcus species NOT DETECTED NOT DETECTED Final  Listeria monocytogenes NOT DETECTED NOT DETECTED Final   Staphylococcus species DETECTED (A) NOT DETECTED Final    Comment: CRITICAL RESULT CALLED TO, READ BACK BY AND VERIFIED WITH: B. GREEN,PHARMD [id number] 09/12/2017 T. TYSOR    Staphylococcus aureus DETECTED (A) NOT DETECTED Final    Comment: Methicillin (oxacillin) susceptible Staphylococcus aureus (MSSA). Preferred therapy is anti staphylococcal beta lactam antibiotic (Cefazolin or Nafcillin), unless clinically contraindicated. CRITICAL RESULT CALLED TO, READ BACK BY AND VERIFIED WITH: B. GREEN,PHARMD [id number] 09/12/2017 T. TYSOR    Methicillin resistance NOT DETECTED NOT DETECTED Final   Streptococcus species NOT DETECTED NOT DETECTED Final   Streptococcus agalactiae NOT DETECTED NOT DETECTED Final   Streptococcus pneumoniae NOT DETECTED NOT DETECTED Final   Streptococcus pyogenes NOT DETECTED NOT DETECTED Final   Acinetobacter baumannii NOT DETECTED NOT DETECTED Final   Enterobacteriaceae species NOT DETECTED NOT DETECTED Final   Enterobacter cloacae complex NOT DETECTED NOT DETECTED Final   Escherichia coli NOT DETECTED NOT DETECTED Final   Klebsiella oxytoca NOT DETECTED NOT DETECTED Final   Klebsiella pneumoniae NOT DETECTED NOT DETECTED Final   Proteus species NOT DETECTED NOT DETECTED Final   Serratia marcescens NOT DETECTED NOT DETECTED Final   Haemophilus influenzae NOT DETECTED NOT DETECTED Final   Neisseria meningitidis NOT DETECTED NOT DETECTED Final   Pseudomonas aeruginosa NOT DETECTED NOT DETECTED Final   Candida albicans NOT DETECTED NOT DETECTED Final   Candida glabrata NOT DETECTED NOT DETECTED Final   Candida krusei NOT DETECTED NOT DETECTED Final   Candida parapsilosis NOT DETECTED NOT DETECTED Final   Candida tropicalis NOT DETECTED NOT  DETECTED Final    Comment: Performed at [facility], [address] [place], [place] [address]  Blood culture (routine x 2)     Status: Abnormal   Collection Time: 09/11/17  9:33 AM  Result Value Ref Range Status   Specimen Description BLOOD BLOOD LEFT HAND  Final   Special Requests   Final    BOTTLES DRAWN AEROBIC AND ANAEROBIC Blood Culture adequate volume   Culture  Setup Time   Final    GRAM POSITIVE COCCI IN CLUSTERS IN BOTH AEROBIC AND ANAEROBIC BOTTLES CRITICAL VALUE NOTED.  VALUE IS CONSISTENT WITH PREVIOUSLY REPORTED AND CALLED VALUE.    Culture (A)  Final    STAPHYLOCOCCUS AUREUS SUSCEPTIBILITIES PERFORMED ON PREVIOUS CULTURE WITHIN THE LAST 5 DAYS. Performed at [facility], [place] [address] [place], [place] [address]    Report Status 09/14/2017 FINAL  Final  MRSA PCR Screening     Status: None   Collection Time: 09/11/17  1:03 PM  Result Value Ref Range Status   MRSA by PCR NEGATIVE NEGATIVE Final    Comment:        The GeneXpert MRSA Assay (FDA approved for NASAL specimens only), is one component of a comprehensive MRSA colonization surveillance program. It is not intended to diagnose MRSA infection nor to guide or monitor treatment for MRSA infections.   Culture, blood (Routine X 2) w Reflex to ID Panel     Status: Abnormal   Collection Time: 09/12/17  9:00 AM  Result Value Ref Range Status   Specimen Description BLOOD RIGHT HAND  Final   Special Requests IN PEDIATRIC BOTTLE Blood Culture adequate volume  Final   Culture  Setup Time   Final    GRAM POSITIVE COCCI IN CLUSTERS IN PEDIATRIC BOTTLE CRITICAL RESULT CALLED TO, READ BACK BY AND VERIFIED WITH: Rosanne Ashing [id number] 09/13/2017 T. Rosalia Hammers  Culture (A)  Final    STAPHYLOCOCCUS AUREUS SUSCEPTIBILITIES PERFORMED ON PREVIOUS CULTURE WITHIN THE LAST 5 DAYS. Performed at [facility], [place] [address] [place], [place] [address]    Report Status 09/15/2017 FINAL  Final  Culture, blood  (Routine X 2) w Reflex to ID Panel     Status: Abnormal   Collection Time: 09/12/17  9:15 AM  Result Value Ref Range Status   Specimen Description BLOOD BLOOD RIGHT HAND  Final   Special Requests IN PEDIATRIC BOTTLE Blood Culture adequate volume  Final   Culture  Setup Time   Final    GRAM POSITIVE COCCI IN CLUSTERS CRITICAL RESULT CALLED TO, READ BACK BY AND VERIFIED WITH: K. Caroline Sauger [id number] 09/13/2017 T. TYSOR    Culture (A)  Final    STAPHYLOCOCCUS AUREUS SUSCEPTIBILITIES PERFORMED ON PREVIOUS CULTURE WITHIN THE LAST 5 DAYS. Performed at [facility], [place] [address], [place], [place] [address]    Report Status 09/15/2017 FINAL  Final  Culture, Urine     Status: None   Collection Time: 09/12/17 11:45 AM  Result Value Ref Range Status   Specimen Description URINE, CLEAN CATCH  Final   Special Requests NONE  Final   Culture   Final    NO GROWTH Performed at [facility], [place] [address] [place], [place] [address]    Report Status 09/13/2017 FINAL  Final  Culture, blood (routine x 2)     Status: Abnormal   Collection Time: 09/13/17  9:02 AM  Result Value Ref Range Status   Specimen Description BLOOD LEFT HAND  Final   Special Requests IN PEDIATRIC BOTTLE Blood Culture adequate volume  Final   Culture  Setup Time   Final    GRAM POSITIVE COCCI IN CLUSTERS IN PEDIATRIC BOTTLE CRITICAL VALUE NOTED.  VALUE IS CONSISTENT WITH PREVIOUSLY REPORTED AND CALLED VALUE.    Culture (A)  Final    STAPHYLOCOCCUS AUREUS SUSCEPTIBILITIES PERFORMED ON PREVIOUS CULTURE WITHIN THE LAST 5 DAYS.    Report Status 09/17/2017 FINAL  Final  Culture, blood (routine x 2)     Status: Abnormal   Collection Time: 09/13/17  9:07 AM  Result Value Ref Range Status   Specimen Description BLOOD RIGHT HAND  Final   Special Requests IN PEDIATRIC BOTTLE Blood Culture adequate volume  Final   Culture  Setup Time   Final    GRAM POSITIVE COCCI IN CLUSTERS IN PEDIATRIC BOTTLE CRITICAL RESULT  CALLED TO, READ BACK BY AND VERIFIED WITHAlona Bene PHARMD [id number] 09/14/17 A BROWNING    Culture STAPHYLOCOCCUS AUREUS (A)  Final   Report Status 09/17/2017 FINAL  Final   Organism ID, Bacteria STAPHYLOCOCCUS AUREUS  Final      Susceptibility   Staphylococcus aureus - MIC*    CIPROFLOXACIN <=0.5 SENSITIVE Sensitive     ERYTHROMYCIN >=8 RESISTANT Resistant     GENTAMICIN <=0.5 SENSITIVE Sensitive     OXACILLIN 0.5 SENSITIVE Sensitive     TETRACYCLINE <=1 SENSITIVE Sensitive     VANCOMYCIN 1 SENSITIVE Sensitive     TRIMETH/SULFA <=10 SENSITIVE Sensitive     CLINDAMYCIN <=0.25 SENSITIVE Sensitive     RIFAMPIN <=0.5 SENSITIVE Sensitive     Inducible Clindamycin NEGATIVE Sensitive     * STAPHYLOCOCCUS AUREUS  MRSA PCR Screening     Status: None   Collection Time: 09/15/17 10:46 AM  Result Value Ref Range Status   MRSA by PCR NEGATIVE NEGATIVE Final  Comment:        The GeneXpert MRSA Assay (FDA approved for NASAL specimens only), is one component of a comprehensive MRSA colonization surveillance program. It is not intended to diagnose MRSA infection nor to guide or monitor treatment for MRSA infections.   Aerobic/Anaerobic Culture (surgical/deep wound)     Status: None (Preliminary result)   Collection Time: 09/15/17 12:33 PM  Result Value Ref Range Status   Specimen Description ABSCESS PARASPINAL  Final   Special Requests NONE  Final   Gram Stain   Final    RARE WBC PRESENT, PREDOMINANTLY PMN FEW GRAM POSITIVE COCCI IN CLUSTERS    Culture   Final    FEW STAPHYLOCOCCUS AUREUS NO ANAEROBES ISOLATED; CULTURE IN PROGRESS FOR 5 DAYS    Report Status PENDING  Incomplete   Organism ID, Bacteria STAPHYLOCOCCUS AUREUS  Final      Susceptibility   Staphylococcus aureus - MIC*    CIPROFLOXACIN <=0.5 SENSITIVE Sensitive     ERYTHROMYCIN >=8 RESISTANT Resistant     GENTAMICIN <=0.5 SENSITIVE Sensitive     OXACILLIN <=0.25 SENSITIVE Sensitive     TETRACYCLINE <=1 SENSITIVE Sensitive      VANCOMYCIN <=0.5 SENSITIVE Sensitive     TRIMETH/SULFA <=10 SENSITIVE Sensitive     CLINDAMYCIN <=0.25 SENSITIVE Sensitive     RIFAMPIN <=0.5 SENSITIVE Sensitive     Inducible Clindamycin NEGATIVE Sensitive     * FEW STAPHYLOCOCCUS AUREUS  Aerobic/Anaerobic Culture (surgical/deep wound)     Status: None (Preliminary result)   Collection Time: 09/15/17 12:41 PM  Result Value Ref Range Status   Specimen Description ABSCESS SPINAL EPIDURAL  Final   Special Requests NONE  Final   Gram Stain   Final    RARE WBC PRESENT, PREDOMINANTLY PMN FEW GRAM POSITIVE COCCI IN CLUSTERS    Culture   Final    MODERATE STAPHYLOCOCCUS AUREUS NO ANAEROBES ISOLATED; CULTURE IN PROGRESS FOR 5 DAYS    Report Status PENDING  Incomplete   Organism ID, Bacteria STAPHYLOCOCCUS AUREUS  Final      Susceptibility   Staphylococcus aureus - MIC*    CIPROFLOXACIN <=0.5 SENSITIVE Sensitive     ERYTHROMYCIN >=8 RESISTANT Resistant     GENTAMICIN <=0.5 SENSITIVE Sensitive     OXACILLIN 0.5 SENSITIVE Sensitive     TETRACYCLINE <=1 SENSITIVE Sensitive     VANCOMYCIN 1 SENSITIVE Sensitive     TRIMETH/SULFA <=10 SENSITIVE Sensitive     CLINDAMYCIN <=0.25 SENSITIVE Sensitive     RIFAMPIN <=0.5 SENSITIVE Sensitive     Inducible Clindamycin NEGATIVE Sensitive     * MODERATE STAPHYLOCOCCUS AUREUS  Culture, blood (Routine X 2) w Reflex to ID Panel     Status: Abnormal (Preliminary result)   Collection Time: 09/17/17  7:53 AM  Result Value Ref Range Status   Specimen Description BLOOD RIGHT HAND  Final   Special Requests IN PEDIATRIC BOTTLE Blood Culture adequate volume  Final   Culture  Setup Time   Final    GRAM POSITIVE COCCI AEROBIC BOTTLE ONLY CRITICAL VALUE NOTED.  VALUE IS CONSISTENT WITH PREVIOUSLY REPORTED AND CALLED VALUE.    Culture (A)  Final    STAPHYLOCOCCUS AUREUS SUSCEPTIBILITIES PERFORMED ON PREVIOUS CULTURE WITHIN THE LAST 5 DAYS.    Report Status PENDING  Incomplete     Scheduled Meds: .  Chlorhexidine Gluconate Cloth  6 each Topical Q2200  . docusate sodium  100 mg Oral BID  . polyethylene glycol  17 g Oral Daily  .  rifampin  300 mg Oral Q12H  . sodium chloride flush  10-40 mL Intracatheter Q12H     LOS: 8 days   Cherene Altes, MD [facility] Office  [phone] Pager - Text Page per Amion as per below:  On-Call/Text Page:      [email or web address]      password [email or web address]  If 7PM-7AM, please contact night-coverage [email or web address] Password [facility] 09/19/2017, 4:56 PM

## 2017-09-19 NOTE — Progress Notes (Signed)
Occupational Therapy Treatment Patient Details Name: [name] MRN: [id number] DOB: [birth year] Today's Date: 09/19/2017    History of present illness 51 year old female w/ a history of IV drug use, hep C and chronic back pain status post previous back surgery who presented to the ED with severe back and neck pain.  An MRI of the T and L-spine revealed a ventral epidural abscess C2-t8. 1/21 Pt with emergent C3-7 lami and epidural abscess debridement   OT comments  Pt able to perform toilet transfer, grooming in standing, and LB dressing with supervision today. Required max assist to don c-collar and min assist for peri care following BM. Educated pt on use of red foam for built up utensil--pt able to return demo with eating lunch, provided red foam. Educated pt on theraputty exercises; pt with difficulty performing due to pain, provided with yellow theraputty. D/c plan remains appropriate. Will continue to follow acutely.   Follow Up Recommendations  DC plan and follow up therapy as arranged by surgeon;Supervision - Intermittent    Equipment Recommendations  3 in 1 bedside commode;Other (comment)(toilet aide)    Recommendations for Other Services      Precautions / Restrictions Precautions Precautions: Cervical;Fall Precaution Comments: Reviewed cervical precautions with pt Required Braces or Orthoses: Cervical Brace Cervical Brace: Hard collar;At all times(off for bathing/dressing/when in bed) Restrictions Weight Bearing Restrictions: No       Mobility Bed Mobility Overal bed mobility: Needs Assistance Bed Mobility: Rolling;Sidelying to Sit Rolling: Supervision Sidelying to sit: Supervision       General bed mobility comments: Good technique, HOB flat with use of bed rails  Transfers Overall transfer level: Needs assistance Equipment used: None Transfers: Sit to/from Stand Sit to Stand: Supervision         General transfer comment: for safety    Balance  Overall balance assessment: Needs assistance Sitting-balance support: Feet supported;No upper extremity supported Sitting balance-Leahy Scale: Good     Standing balance support: No upper extremity supported;During functional activity Standing balance-Leahy Scale: Fair                             ADL either performed or assessed with clinical judgement   ADL Overall ADL's : Needs assistance/impaired Eating/Feeding: Set up;Sitting;With adaptive utensils Eating/Feeding Details (indicate cue type and reason): provided pt with red foam for built up utensils--pt able to demo self feeding with setup Grooming: Supervision/safety;Wash/dry hands;Standing           Upper Body Dressing : Maximal assistance;Sitting Upper Body Dressing Details (indicate cue type and reason): to don cervical collar Lower Body Dressing: Supervision/safety;Sit to/from stand Lower Body Dressing Details (indicate cue type and reason): Pt able to don socks at EOB using correct technique Toilet Transfer: Supervision/safety;Ambulation;Comfort height toilet   Toileting- Clothing Manipulation and Hygiene: Minimal assistance;Sit to/from stand Toileting - Clothing Manipulation Details (indicate cue type and reason): Continues to require min assist for peri care following BM     Functional mobility during ADLs: Supervision/safety       Vision       Perception     Praxis      Cognition Arousal/Alertness: Awake/alert Behavior During Therapy: Anxious Overall Cognitive Status: Within Functional Limits for tasks assessed  Exercises Exercises: Other exercises Other Exercises Other Exercises: Educated pt on theraputty exercises and provided yellow theraputty--pt able to return demo x3 each bil then reports "it hurts too much". Would benefit from written HEP, will plan to establish next session   Shoulder Instructions       General Comments       Pertinent Vitals/ Pain       Pain Assessment: 0-10 Pain Score: 9  Pain Location: neck with mobility Pain Descriptors / Indicators: Grimacing;Guarding Pain Intervention(s): Monitored during session;Repositioned;Patient requesting pain meds-RN notified  Home Living                                          Prior Functioning/Environment              Frequency  Min 3X/week        Progress Toward Goals  OT Goals(current goals can now be found in the care plan section)  Progress towards OT goals: Progressing toward goals  Acute Rehab OT Goals Patient Stated Goal: get better and reduce pain OT Goal Formulation: With patient  Plan Discharge plan remains appropriate    Co-evaluation                 AM-PAC PT "6 Clicks" Daily Activity     Outcome Measure   Help from another person eating meals?: A Little Help from another person taking care of personal grooming?: A Little Help from another person toileting, which includes using toliet, bedpan, or urinal?: A Little Help from another person bathing (including washing, rinsing, drying)?: A Little Help from another person to put on and taking off regular upper body clothing?: A Lot Help from another person to put on and taking off regular lower body clothing?: A Little 6 Click Score: 17    End of Session Equipment Utilized During Treatment: Cervical collar  OT Visit Diagnosis: Unsteadiness on feet (R26.81);Muscle weakness (generalized) (M62.81);Pain Pain - part of body: (neck)   Activity Tolerance Patient tolerated treatment well   Patient Left in chair;with call bell/phone within reach   Nurse Communication Mobility status;Patient requests pain meds        Time: 2992-4268 OT Time Calculation (min): 24 min  Charges: OT General Charges $OT Visit: 1 Visit OT Treatments $Self Care/Home Management : 23-37 mins  Freeman Borba A. Ulice Brilliant, M.S., OTR/L Pager: [place] 09/19/2017, 1:58 PM

## 2017-09-20 ENCOUNTER — Inpatient Hospital Stay (HOSPITAL_COMMUNITY): Payer: Self-pay

## 2017-09-20 ENCOUNTER — Inpatient Hospital Stay (HOSPITAL_COMMUNITY): Payer: Self-pay | Admitting: Certified Registered Nurse Anesthetist

## 2017-09-20 ENCOUNTER — Encounter (HOSPITAL_COMMUNITY): Payer: Self-pay | Admitting: *Deleted

## 2017-09-20 ENCOUNTER — Encounter (HOSPITAL_COMMUNITY)
Admission: EM | Disposition: A | Discharge status: Home / Self Care | Payer: Self-pay | Source: Home / Self Care | Attending: Internal Medicine

## 2017-09-20 DIAGNOSIS — R7881 Bacteremia: Secondary | ICD-10-CM

## 2017-09-20 HISTORY — PX: TEE WITHOUT CARDIOVERSION: SHX5443

## 2017-09-20 LAB — AEROBIC/ANAEROBIC CULTURE W GRAM STAIN (SURGICAL/DEEP WOUND)

## 2017-09-20 LAB — CULTURE, BLOOD (ROUTINE X 2): SPECIAL REQUESTS: ADEQUATE

## 2017-09-20 LAB — BASIC METABOLIC PANEL
ANION GAP: 6 (ref 5–15)
BUN: 5 mg/dL — ABNORMAL LOW (ref 6–20)
CO2: 28 mmol/L (ref 22–32)
Calcium: 7.7 mg/dL — ABNORMAL LOW (ref 8.9–10.3)
Chloride: 101 mmol/L (ref 101–111)
Creatinine, Ser: 0.54 mg/dL (ref 0.44–1.00)
GFR calc Af Amer: >60 mL/min (ref >60)
GLUCOSE: 103 mg/dL — AB (ref 65–99)
POTASSIUM: 3.4 mmol/L — AB (ref 3.5–5.1)
Sodium: 135 mmol/L (ref 135–145)

## 2017-09-20 LAB — AEROBIC/ANAEROBIC CULTURE (SURGICAL/DEEP WOUND)

## 2017-09-20 SURGERY — ECHOCARDIOGRAM, TRANSESOPHAGEAL
Anesthesia: General

## 2017-09-20 MED ORDER — POTASSIUM CHLORIDE CRYS ER 20 MEQ PO TBCR: 40.0000 meq | EXTENDED_RELEASE_TABLET | Freq: Once | ORAL | Status: DC

## 2017-09-20 MED ORDER — PROPOFOL 10 MG/ML IV BOLUS
INTRAVENOUS | Status: DC | PRN
  Administered 2017-09-20 (×3): 20 mg via INTRAVENOUS

## 2017-09-20 MED ORDER — PHENYLEPHRINE 40 MCG/ML (10ML) SYRINGE FOR IV PUSH (FOR BLOOD PRESSURE SUPPORT)
PREFILLED_SYRINGE | INTRAVENOUS | Status: DC | PRN
  Administered 2017-09-20: 120 ug via INTRAVENOUS
  Administered 2017-09-20 (×2): 80 ug via INTRAVENOUS

## 2017-09-20 MED ORDER — LACTATED RINGERS IV SOLN
INTRAVENOUS | Status: DC | PRN
  Administered 2017-09-20: 11:00:00 via INTRAVENOUS

## 2017-09-20 MED ORDER — PROPOFOL 500 MG/50ML IV EMUL
INTRAVENOUS | Status: DC | PRN
  Administered 2017-09-20: 75 ug/kg/min via INTRAVENOUS

## 2017-09-20 MED ORDER — BUTAMBEN-TETRACAINE-BENZOCAINE 2-2-14 % EX AERO
INHALATION_SPRAY | CUTANEOUS | Status: DC | PRN
  Administered 2017-09-20: 2 via TOPICAL

## 2017-09-20 NOTE — CV Procedure (Signed)
TEE Propofol Anesthesia  Normal valves No vegetation No SBE EF 65%  Jenkins Rouge

## 2017-09-20 NOTE — Progress Notes (Signed)
Focus of session on toileting (issued and instructed in use of toilet aid), standing grooming and theraputty home exercise program. Pt continues to be limited by neck pain. Mild unsteadiness with ambulation.    09/20/17 0900  OT Visit Information  Last OT Received On 09/20/17  Assistance Needed +1  History of Present Illness 51 year old female w/ a history of IV drug use, hep C and chronic back pain status post previous back surgery who presented to the ED with severe back and neck pain.  An MRI of the T and L-spine revealed a ventral epidural abscess C2-t8. 1/21 Pt with emergent C3-7 lami and epidural abscess debridement  Precautions  Precautions Cervical;Fall  Required Braces or Orthoses Cervical Brace  Cervical Brace Hard collar;At all times (off in bed and for ADL)  Pain Assessment  Pain Assessment Faces  Faces Pain Scale 6  Pain Location neck with mobility  Pain Descriptors / Indicators Grimacing;Guarding  Pain Intervention(s) Monitored during session;Repositioned;Ice applied  Cognition  Arousal/Alertness Awake/alert  Behavior During Therapy Anxious  Overall Cognitive Status Within Functional Limits for tasks assessed  ADL  Overall ADL's  Needs assistance/impaired  Grooming Wash/dry hands;Supervision/safety;Standing  Upper Body Dressing  Supervision/safety;Standing  Upper Body Dressing Details (indicate cue type and reason) to doff front opening Community education officer Min guard;Ambulation;Comfort height toilet  Toileting- Clothing Manipulation and Hygiene Maximal assistance;Sit to/from stand  Toileting - Clothing Manipulation Details (indicate cue type and reason) issued and instructed in use of toilet tongs  Functional mobility during ADLs Min guard  General ADL Comments Pt given handout with therapy putty exercises, reports having done exercises yesterday on her own. Reports being able to open own milk.   Bed Mobility  Overal bed mobility Needs Assistance  Bed Mobility Sit to  Sidelying;Rolling  Rolling Supervision  Sit to sidelying Supervision  General bed mobility comments cues for technique  Transfers  Overall transfer level Needs assistance  Equipment used None  Transfers Sit to/from Stand  Sit to Stand Supervision  General transfer comment for safety  OT - End of Session  Equipment Utilized During Treatment Cervical collar  Activity Tolerance Patient tolerated treatment well  Patient left in bed;with call bell/phone within reach  OT Assessment/Plan  OT Plan Discharge plan remains appropriate  OT Visit Diagnosis Unsteadiness on feet (R26.81);Muscle weakness (generalized) (M62.81);Pain  OT Frequency (ACUTE ONLY) Min 3X/week  Follow Up Recommendations DC plan and follow up therapy as arranged by surgeon;Supervision - Intermittent  OT Equipment 3 in 1 bedside commode  AM-PAC OT "6 Clicks" Daily Activity Outcome Measure  Help from another person eating meals? 4  Help from another person taking care of personal grooming? 3  Help from another person toileting, which includes using toliet, bedpan, or urinal? 3  Help from another person bathing (including washing, rinsing, drying)? 3  Help from another person to put on and taking off regular upper body clothing? 4  Help from another person to put on and taking off regular lower body clothing? 3  6 Click Score 20  ADL G Code Conversion CJ  OT Goal Progression  Progress towards OT goals Progressing toward goals  Acute Rehab OT Goals  Patient Stated Goal get better and reduce pain  OT Goal Formulation With patient  Time For Goal Achievement 10/01/17  Potential to Achieve Goals Good  OT Time Calculation  OT Start Time (ACUTE ONLY) 0846  OT Stop Time (ACUTE ONLY) 0905  OT Time Calculation (min) 19 min  OT General Charges  $  OT Visit 1 Visit  OT Treatments  $Self Care/Home Management  8-22 mins  09/20/2017 Nestor Lewandowsky, OTR/L Pager: [phone]

## 2017-09-20 NOTE — Anesthesia Preprocedure Evaluation (Signed)
Anesthesia Evaluation  Patient identified by MRN, date of birth, ID band Patient awake    Reviewed: Allergy & Precautions, NPO status , Patient's Chart, lab work & pertinent test results  Airway Mallampati: III  TM Distance: >3 FB Neck ROM: Full    Dental  (+) Dental Advisory Given   Pulmonary Current Smoker,    breath sounds clear to auscultation       Cardiovascular negative cardio ROS  + dysrhythmias Atrial Fibrillation  Rhythm:Regular Rate:Normal     Neuro/Psych Anxiety Depression Large epidural abscess    GI/Hepatic negative GI ROS, (+)     substance abuse  methamphetamine use,   Endo/Other  negative endocrine ROSMorbid obesity  Renal/GU negative Renal ROS     Musculoskeletal   Abdominal   Peds  Hematology  (+) anemia ,   Anesthesia Other Findings   Reproductive/Obstetrics                             Lab Results  Component Value Date   WBC 16.9 (H) 09/19/2017   HGB 10.0 (L) 09/19/2017   HCT 29.9 (L) 09/19/2017   MCV 89.5 09/19/2017   PLT 377 09/19/2017   Lab Results  Component Value Date   CREATININE 0.54 09/20/2017   BUN 5 (L) 09/20/2017   NA 135 09/20/2017   K 3.4 (L) 09/20/2017   CL 101 09/20/2017   CO2 28 09/20/2017    Anesthesia Physical  Anesthesia Plan  ASA: III  Anesthesia Plan: General   Post-op Pain Management:    Induction: Intravenous  PONV Risk Score and Plan: 2 and Treatment may vary due to age or medical condition  Airway Management Planned: Mask  Additional Equipment:   Intra-op Plan:   Post-operative Plan:   Informed Consent: I have reviewed the patients History and Physical, chart, labs and discussed the procedure including the risks, benefits and alternatives for the proposed anesthesia with the patient or authorized representative who has indicated his/her understanding and acceptance.   Dental advisory given  Plan Discussed  with: CRNA  Anesthesia Plan Comments:         Anesthesia Quick Evaluation

## 2017-09-20 NOTE — Interval H&P Note (Signed)
History and Physical Interval Note:  09/20/2017 9:53 AM  [name]  has presented today for surgery, with the diagnosis of bacteremia  The various methods of treatment have been discussed with the patient and family. After consideration of risks, benefits and other options for treatment, the patient has consented to  Procedure(s): TRANSESOPHAGEAL ECHOCARDIOGRAM (TEE) (N/A) as a surgical intervention .  The patient's history has been reviewed, patient examined, no change in status, stable for surgery.  I have reviewed the patient's chart and labs.  Questions were answered to the patient's satisfaction.     Jenkins Rouge

## 2017-09-20 NOTE — Progress Notes (Signed)
[employer] TEAM 1 - Stepdown/ICU TEAM  [name]  XLK:[id number] DOB: 1966/04/06 DOA: 09/11/2017 PCP: Marliss Coots, NP    Brief Narrative:  51 year old female w/ a history of IV drug use and chronic back pain status post previous back surgery who presented to the ED with severe back and neck pain.  An MRI of the T and L-spine revealed a ventral epidural abscess.  Patient was also noted to have MSSA bacteremia.    Patient was placed empirically on IV antibiotics.  ID and neurosurgery consulted.  Patient was subsequently transferred to [facility].  Significant Events: 11/17 admit to [facility]  11/18 transfer to [facility] 11/21 developed urinary retention - emergent C3-C7 laminectomy for decompression of spinal cord and debridement of epidural abscess 11/26 TEE   Subjective: Alert and conversant.  Reports some posterior neck pain today but this is somewhat better than it has been.  Denies chest pain shortness of breath nausea or vomiting.  Is optimistic but feels she will need significant rehabilitation before she can go home to live by herself.  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 s/p laminectomy  ID now directing care of this issue - TEE revealed normal valves w/ no vegetations - if IV abx required for full course of tx will requires SNF placement as she can not be sent home w/ IV access w/ hx of IVD abuse   Hypokalemia Cont to supplement and follow   Polysubstance abuse/IV drug use Possessed suspected drugs and definite drug paraphernalia at time of admission - no clear evidence of withdrawal at this time - DO NOT SEND  HOME w/ IV access   Hep C+ Will need f/u after d/c in the ID clinic  Morbid Obesity - Body mass index is 41.21 kg/m.   DVT prophylaxis: SCDs Code Status: FULL CODE Family Communication: no family present at time of exam  Disposition Plan: tele - PT/OT - need to make  final plan for abx tx which will dictate disposition   Consultants:  Heme/Onc ID NS  Antimicrobials:  Nafcillin 11/19 > Rifampin 11/22 > Cefazolin 11/18 > 11/19 Cefepime 09/11/2017 > 09/12/2017 Vancomycin 11/17  Objective: Blood pressure 140/87, pulse 84, temperature 98.2 F (36.8 C), temperature source Oral, resp. rate 18, height 5\' 6"  (1.676 m), weight 115.8 kg (255 lb 4.7 oz), SpO2 100 %.  Intake/Output Summary (Last 24 hours) at 09/20/2017 1558 Last data filed at 09/20/2017 1159 Gross per 24 hour  Intake 1481 ml  Output 4000 ml  Net -2519 ml   Filed Weights   09/12/17 1319 09/13/17 0730 09/14/17 0831  Weight: 115.8 kg (255 lb 4.7 oz) 117.4 kg (258 lb 13.1 oz) 115.8 kg (255 lb 4.7 oz)    Examination: General: No acute respiratory distress - alert and oriented  Lungs: CTA B - no wheezing o Cardiovascular: Regular rate and rhythm - no M or rub  Abdom:  Overweight, soft, no mass, BS+ Ext:  Trace B LE edema   CBC: Recent Labs  Lab 09/14/17 0401 09/15/17 0557 09/16/17 0431 09/19/17 0324  WBC 19.0* 21.1* 15.0* 16.9*  HGB 12.2 11.7* 10.6* 10.0*  HCT 37.4 35.3* 31.5* 29.9*  MCV 90.8 91.7 89.7 89.5  PLT 307 362 350 366   Basic Metabolic Panel: Recent Labs  Lab 09/14/17 0401 09/15/17 0557 09/16/17 0431 09/19/17 0324 09/19/17 2300 09/20/17 0357  NA 137 138 137 133* 130* 135  K 3.3* 3.9 3.8 3.0* 3.2* 3.4*  CL 102 101 101 98* 97* 101  CO2 28 28 29 28 27 28   GLUCOSE 93 77 128* 112* 101* 103*  BUN 6 7 8  5* 5* 5*  CREATININE 0.71 0.76 0.63 0.57 0.57 0.54  CALCIUM 7.6* 7.8* 7.4* 7.6* 7.5* 7.7*  MG 1.9  --   --   --   --   --   PHOS 5.0*  --   --   --   --   --    GFR: Estimated Creatinine Clearance: 111.2 mL/min (by C-G formula based on SCr of 0.54 mg/dL).  Liver Function Tests: Recent Labs  Lab 09/14/17 0401 09/15/17 1822 09/16/17 0431 09/19/17 0324  AST 36  --  24 22  ALT 16 15 12* 13*  ALKPHOS 513*  --  621* 371*  BILITOT 3.5*  --  3.4* 2.3*    PROT 5.2*  --  5.2* 5.7*  ALBUMIN 1.9*  --  1.7* 1.7*    Coagulation Profile: Recent Labs  Lab 09/15/17 0803  INR 1.19    HbA1C: Hgb A1c MFr Bld  Date/Time Value Ref Range Status  04/11/2017 06:20 PM 5.9 (H) 4.8 - 5.6 % Final    Comment:    (NOTE)         Pre-diabetes: 5.7 - 6.4         Diabetes: >6.4         Glycemic control for adults with diabetes: <7.0      Recent Results (from the past 240 hour(s))  Blood culture (routine x 2)     Status: Abnormal   Collection Time: 09/11/17  9:20 AM  Result Value Ref Range Status   Specimen Description BLOOD RIGHT ANTECUBITAL  Final   Special Requests   Final    BOTTLES DRAWN AEROBIC AND ANAEROBIC Blood Culture adequate volume   Culture  Setup Time   Final    GRAM POSITIVE COCCI IN CLUSTERS IN BOTH AEROBIC AND ANAEROBIC BOTTLES CRITICAL RESULT CALLED TO, READ BACK BY AND VERIFIED WITH: Novella Rob [id number] 09/12/2017 Mena Goes Performed at [facility], [address], [place],  [address]    Culture STAPHYLOCOCCUS AUREUS (A)  Final   Report Status 09/14/2017 FINAL  Final   Organism ID, Bacteria STAPHYLOCOCCUS AUREUS  Final      Susceptibility   Staphylococcus aureus - MIC*    CIPROFLOXACIN <=0.5 SENSITIVE Sensitive     ERYTHROMYCIN >=8 RESISTANT Resistant     GENTAMICIN <=0.5 SENSITIVE Sensitive     OXACILLIN 0.5 SENSITIVE Sensitive     TETRACYCLINE <=1 SENSITIVE Sensitive     VANCOMYCIN <=0.5 SENSITIVE Sensitive     TRIMETH/SULFA <=10 SENSITIVE Sensitive     CLINDAMYCIN <=0.25 SENSITIVE Sensitive     RIFAMPIN <=0.5 SENSITIVE Sensitive     Inducible Clindamycin NEGATIVE Sensitive     * STAPHYLOCOCCUS AUREUS  Blood Culture ID Panel (Reflexed)     Status: Abnormal   Collection Time: 09/11/17  9:20 AM  Result Value Ref Range Status   Enterococcus species NOT DETECTED NOT DETECTED Final   Listeria monocytogenes NOT DETECTED NOT DETECTED Final   Staphylococcus species DETECTED (A) NOT DETECTED Final     Comment: CRITICAL RESULT CALLED TO, READ BACK BY AND VERIFIED WITH: B. GREEN,PHARMD [id number] 09/12/2017 T. TYSOR    Staphylococcus aureus DETECTED (A) NOT DETECTED Final    Comment: Methicillin (oxacillin) susceptible Staphylococcus aureus (MSSA). Preferred therapy is anti staphylococcal beta lactam antibiotic (  Cefazolin or Nafcillin), unless clinically contraindicated. CRITICAL RESULT CALLED TO, READ BACK BY AND VERIFIED WITH: B. GREEN,PHARMD [id number] 09/12/2017 T. TYSOR    Methicillin resistance NOT DETECTED NOT DETECTED Final   Streptococcus species NOT DETECTED NOT DETECTED Final   Streptococcus agalactiae NOT DETECTED NOT DETECTED Final   Streptococcus pneumoniae NOT DETECTED NOT DETECTED Final   Streptococcus pyogenes NOT DETECTED NOT DETECTED Final   Acinetobacter baumannii NOT DETECTED NOT DETECTED Final   Enterobacteriaceae species NOT DETECTED NOT DETECTED Final   Enterobacter cloacae complex NOT DETECTED NOT DETECTED Final   Escherichia coli NOT DETECTED NOT DETECTED Final   Klebsiella oxytoca NOT DETECTED NOT DETECTED Final   Klebsiella pneumoniae NOT DETECTED NOT DETECTED Final   Proteus species NOT DETECTED NOT DETECTED Final   Serratia marcescens NOT DETECTED NOT DETECTED Final   Haemophilus influenzae NOT DETECTED NOT DETECTED Final   Neisseria meningitidis NOT DETECTED NOT DETECTED Final   Pseudomonas aeruginosa NOT DETECTED NOT DETECTED Final   Candida albicans NOT DETECTED NOT DETECTED Final   Candida glabrata NOT DETECTED NOT DETECTED Final   Candida krusei NOT DETECTED NOT DETECTED Final   Candida parapsilosis NOT DETECTED NOT DETECTED Final   Candida tropicalis NOT DETECTED NOT DETECTED Final    Comment: Performed at [facility], [address] [place], [place] [address]  Blood culture (routine x 2)     Status: Abnormal   Collection Time: 09/11/17  9:33 AM  Result Value Ref Range Status   Specimen Description BLOOD BLOOD LEFT HAND  Final   Special Requests    Final    BOTTLES DRAWN AEROBIC AND ANAEROBIC Blood Culture adequate volume   Culture  Setup Time   Final    GRAM POSITIVE COCCI IN CLUSTERS IN BOTH AEROBIC AND ANAEROBIC BOTTLES CRITICAL VALUE NOTED.  VALUE IS CONSISTENT WITH PREVIOUSLY REPORTED AND CALLED VALUE.    Culture (A)  Final    STAPHYLOCOCCUS AUREUS SUSCEPTIBILITIES PERFORMED ON PREVIOUS CULTURE WITHIN THE LAST 5 DAYS. Performed at [facility], [place] [address] [place], [place] [address]    Report Status 09/14/2017 FINAL  Final  MRSA PCR Screening     Status: None   Collection Time: 09/11/17  1:03 PM  Result Value Ref Range Status   MRSA by PCR NEGATIVE NEGATIVE Final    Comment:        The GeneXpert MRSA Assay (FDA approved for NASAL specimens only), is one component of a comprehensive MRSA colonization surveillance program. It is not intended to diagnose MRSA infection nor to guide or monitor treatment for MRSA infections.   Culture, blood (Routine X 2) w Reflex to ID Panel     Status: Abnormal   Collection Time: 09/12/17  9:00 AM  Result Value Ref Range Status   Specimen Description BLOOD RIGHT HAND  Final   Special Requests IN PEDIATRIC BOTTLE Blood Culture adequate volume  Final   Culture  Setup Time   Final    GRAM POSITIVE COCCI IN CLUSTERS IN PEDIATRIC BOTTLE CRITICAL RESULT CALLED TO, READ BACK BY AND VERIFIED WITH: K. Caroline Sauger [id number] 09/13/2017 T. TYSOR    Culture (A)  Final    STAPHYLOCOCCUS AUREUS SUSCEPTIBILITIES PERFORMED ON PREVIOUS CULTURE WITHIN THE LAST 5 DAYS. Performed at [facility], [place] [address], [place],  [address]    Report Status 09/15/2017 FINAL  Final  Culture, blood (Routine X 2) w Reflex to ID Panel     Status: Abnormal   Collection  Time: 09/12/17  9:15 AM  Result Value Ref Range Status   Specimen Description BLOOD BLOOD RIGHT HAND  Final   Special Requests IN PEDIATRIC BOTTLE Blood Culture adequate volume  Final   Culture  Setup Time   Final    GRAM  POSITIVE COCCI IN CLUSTERS CRITICAL RESULT CALLED TO, READ BACK BY AND VERIFIED WITH: K. WEIGLE,PHARMD 0865 09/13/2017 T. TYSOR    Culture (A)  Final    STAPHYLOCOCCUS AUREUS SUSCEPTIBILITIES PERFORMED ON PREVIOUS CULTURE WITHIN THE LAST 5 DAYS. Performed at [facility], [place] [address], [place], [place] [address]    Report Status 09/15/2017 FINAL  Final  Culture, Urine     Status: None   Collection Time: 09/12/17 11:45 AM  Result Value Ref Range Status   Specimen Description URINE, CLEAN CATCH  Final   Special Requests NONE  Final   Culture   Final    NO GROWTH Performed at [facility], [place] [address] [place], [place] [address]    Report Status 09/13/2017 FINAL  Final  Culture, blood (routine x 2)     Status: Abnormal   Collection Time: 09/13/17  9:02 AM  Result Value Ref Range Status   Specimen Description BLOOD LEFT HAND  Final   Special Requests IN PEDIATRIC BOTTLE Blood Culture adequate volume  Final   Culture  Setup Time   Final    GRAM POSITIVE COCCI IN CLUSTERS IN PEDIATRIC BOTTLE CRITICAL VALUE NOTED.  VALUE IS CONSISTENT WITH PREVIOUSLY REPORTED AND CALLED VALUE.    Culture (A)  Final    STAPHYLOCOCCUS AUREUS SUSCEPTIBILITIES PERFORMED ON PREVIOUS CULTURE WITHIN THE LAST 5 DAYS.    Report Status 09/17/2017 FINAL  Final  Culture, blood (routine x 2)     Status: Abnormal   Collection Time: 09/13/17  9:07 AM  Result Value Ref Range Status   Specimen Description BLOOD RIGHT HAND  Final   Special Requests IN PEDIATRIC BOTTLE Blood Culture adequate volume  Final   Culture  Setup Time   Final    GRAM POSITIVE COCCI IN CLUSTERS IN PEDIATRIC BOTTLE CRITICAL RESULT CALLED TO, READ BACK BY AND VERIFIED WITHAlona Bene PHARMD [id number] 09/14/17 A BROWNING    Culture STAPHYLOCOCCUS AUREUS (A)  Final   Report Status 09/17/2017 FINAL  Final   Organism ID, Bacteria STAPHYLOCOCCUS AUREUS  Final      Susceptibility   Staphylococcus aureus - MIC*    CIPROFLOXACIN <=0.5  SENSITIVE Sensitive     ERYTHROMYCIN >=8 RESISTANT Resistant     GENTAMICIN <=0.5 SENSITIVE Sensitive     OXACILLIN 0.5 SENSITIVE Sensitive     TETRACYCLINE <=1 SENSITIVE Sensitive     VANCOMYCIN 1 SENSITIVE Sensitive     TRIMETH/SULFA <=10 SENSITIVE Sensitive     CLINDAMYCIN <=0.25 SENSITIVE Sensitive     RIFAMPIN <=0.5 SENSITIVE Sensitive     Inducible Clindamycin NEGATIVE Sensitive     * STAPHYLOCOCCUS AUREUS  MRSA PCR Screening     Status: None   Collection Time: 09/15/17 10:46 AM  Result Value Ref Range Status   MRSA by PCR NEGATIVE NEGATIVE Final    Comment:        The GeneXpert MRSA Assay (FDA approved for NASAL specimens only), is one component of a comprehensive MRSA colonization surveillance program. It is not intended to diagnose MRSA infection nor to guide or monitor treatment for MRSA infections.   Aerobic/Anaerobic Culture (surgical/deep wound)     Status: None   Collection Time:  09/15/17 12:33 PM  Result Value Ref Range Status   Specimen Description ABSCESS PARASPINAL  Final   Special Requests NONE  Final   Gram Stain   Final    RARE WBC PRESENT, PREDOMINANTLY PMN FEW GRAM POSITIVE COCCI IN CLUSTERS    Culture FEW STAPHYLOCOCCUS AUREUS NO ANAEROBES ISOLATED   Final   Report Status 09/20/2017 FINAL  Final   Organism ID, Bacteria STAPHYLOCOCCUS AUREUS  Final      Susceptibility   Staphylococcus aureus - MIC*    CIPROFLOXACIN <=0.5 SENSITIVE Sensitive     ERYTHROMYCIN >=8 RESISTANT Resistant     GENTAMICIN <=0.5 SENSITIVE Sensitive     OXACILLIN <=0.25 SENSITIVE Sensitive     TETRACYCLINE <=1 SENSITIVE Sensitive     VANCOMYCIN <=0.5 SENSITIVE Sensitive     TRIMETH/SULFA <=10 SENSITIVE Sensitive     CLINDAMYCIN <=0.25 SENSITIVE Sensitive     RIFAMPIN <=0.5 SENSITIVE Sensitive     Inducible Clindamycin NEGATIVE Sensitive     * FEW STAPHYLOCOCCUS AUREUS  Aerobic/Anaerobic Culture (surgical/deep wound)     Status: None   Collection Time: 09/15/17 12:41 PM   Result Value Ref Range Status   Specimen Description ABSCESS SPINAL EPIDURAL  Final   Special Requests NONE  Final   Gram Stain   Final    RARE WBC PRESENT, PREDOMINANTLY PMN FEW GRAM POSITIVE COCCI IN CLUSTERS    Culture   Final    MODERATE STAPHYLOCOCCUS AUREUS NO ANAEROBES ISOLATED    Report Status 09/20/2017 FINAL  Final   Organism ID, Bacteria STAPHYLOCOCCUS AUREUS  Final      Susceptibility   Staphylococcus aureus - MIC*    CIPROFLOXACIN <=0.5 SENSITIVE Sensitive     ERYTHROMYCIN >=8 RESISTANT Resistant     GENTAMICIN <=0.5 SENSITIVE Sensitive     OXACILLIN 0.5 SENSITIVE Sensitive     TETRACYCLINE <=1 SENSITIVE Sensitive     VANCOMYCIN 1 SENSITIVE Sensitive     TRIMETH/SULFA <=10 SENSITIVE Sensitive     CLINDAMYCIN <=0.25 SENSITIVE Sensitive     RIFAMPIN <=0.5 SENSITIVE Sensitive     Inducible Clindamycin NEGATIVE Sensitive     * MODERATE STAPHYLOCOCCUS AUREUS  Culture, blood (Routine X 2) w Reflex to ID Panel     Status: Abnormal   Collection Time: 09/17/17  7:53 AM  Result Value Ref Range Status   Specimen Description BLOOD RIGHT HAND  Final   Special Requests IN PEDIATRIC BOTTLE Blood Culture adequate volume  Final   Culture  Setup Time   Final    GRAM POSITIVE COCCI AEROBIC BOTTLE ONLY CRITICAL VALUE NOTED.  VALUE IS CONSISTENT WITH PREVIOUSLY REPORTED AND CALLED VALUE.    Culture (A)  Final    STAPHYLOCOCCUS AUREUS SUSCEPTIBILITIES PERFORMED ON PREVIOUS CULTURE WITHIN THE LAST 5 DAYS.    Report Status 09/20/2017 FINAL  Final     Scheduled Meds: . Chlorhexidine Gluconate Cloth  6 each Topical Q2200  . docusate sodium  100 mg Oral BID  . polyethylene glycol  17 g Oral Daily  . potassium chloride  40 mEq Oral BID  . rifampin  300 mg Oral Q12H  . sodium chloride flush  10-40 mL Intracatheter Q12H     LOS: 9 days   Cherene Altes, MD [facility] Office  [phone] Pager - Text Page per Amion as per below:  On-Call/Text Page:       [email or web address]      password [email or web address]  If 7PM-7AM, please contact night-coverage [email or web address] Password [email or web address] 09/20/2017, 3:58 PM

## 2017-09-20 NOTE — Progress Notes (Signed)
No issues overnight. Neck feels better today. Up to chair and ambulating. Tolerating diet.  EXAM:  BP (!) 115/50   Pulse 70   Temp 98.2 F (36.8 C)   Resp 18   Ht 5\' 6"  (1.676 m)   Wt 115.8 kg (255 lb 4.7 oz)   SpO2 95%   BMI 41.21 kg/m   Awake, alert, oriented  Speech fluent, appropriate  CN grossly intact  Good strength BUE/BLE Drain in place, minimal output  IMPRESSION:  51 y.o. female with large cervicothoracic SEA, POD#5 C3-C7 lami, lateral mass fusion. Neurologically at baseline. The ventral thoracic abscess cannot reasonably be debrided or decompressed due to its location, extent, and based on intraoperative findings, the fact that it is more phlegmon rather than liquid pus. I therefore believe she has been maximally managed from a surgical standpoint.  PLAN: - D/C Hemovac today - Cont to mobilize as tolerated with Aspen collar when up - D/C staples in 7-10d - Can f/u with me in the office in 3 weeks.

## 2017-09-20 NOTE — Progress Notes (Signed)
  Echocardiogram Echocardiogram Transesophageal has been performed.  Bobbye Charleston 09/20/2017, 12:16 PM

## 2017-09-20 NOTE — Transfer of Care (Signed)
Immediate Anesthesia Transfer of Care Note  Patient: [name]  Procedure(s) Performed: TRANSESOPHAGEAL ECHOCARDIOGRAM (TEE) (N/A )  Patient Location: Endoscopy Unit  Anesthesia Type:MAC  Level of Consciousness: drowsy and patient cooperative  Airway & Oxygen Therapy: Patient Spontanous Breathing and Patient connected to nasal cannula oxygen  Post-op Assessment: Report given to RN and Post -op Vital signs reviewed and stable  Post vital signs: Reviewed and stable  Last Vitals:  Vitals:   09/20/17 0822 09/20/17 1011  BP: (!) 156/99 (!) 159/99  Pulse: 74 76  Resp:  15  Temp: 36.8 C 36.7 C  SpO2: 100% 99%    Last Pain:  Vitals:   09/20/17 1011  TempSrc: Oral  PainSc: 9       Patients Stated Pain Goal: 4 (72/09/47 0962)  Complications: No apparent anesthesia complications

## 2017-09-20 NOTE — Progress Notes (Signed)
    [facility] for Infectious Disease    Date of Admission:  09/11/2017   Total days of antibiotics 10        Day 8 nafcillin        Day 5 rifampin          ID: [name] is a 51 y.o. 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 RN reported that she was ambulating with PT. Blood cx still persistently febrile Medications:  . Chlorhexidine Gluconate Cloth  6 each Topical Q2200  . docusate sodium  100 mg Oral BID  . polyethylene glycol  17 g Oral Daily  . potassium chloride  40 mEq Oral BID  . rifampin  300 mg Oral Q12H  . sodium chloride flush  10-40 mL Intracatheter Q12H    Objective: Vital signs in last 24 hours: Temp:  [97.8 F (36.6 C)-98.5 F (36.9 C)] 98.2 F (36.8 C) (11/26 1309) Pulse Rate:  [52-89] 84 (11/26 1309) Resp:  [15-26] 18 (11/26 1220) BP: (112-159)/(30-99) 140/87 (11/26 1309) SpO2:  [92 %-100 %] 100 % (11/26 1309)   Did not examine Lab Results Recent Labs    09/19/17 0324 09/19/17 2300 09/20/17 0357  WBC 16.9*  --   --   HGB 10.0*  --   --   HCT 29.9*  --   --   NA 133* 130* 135  K 3.0* 3.2* 3.4*  CL 98* 97* 101  CO2 28 27 28   BUN 5* 5* 5*  CREATININE 0.57 0.57 0.54   Liver Panel Recent Labs    09/19/17 0324  PROT 5.7*  ALBUMIN 1.7*  AST 22  ALT 13*  ALKPHOS 371*  BILITOT 2.3*     Microbiology: 11/25 blood cx + MSSA Studies/Results: No results found.  TEE = no vegetation Assessment/Plan: 51 y.o. female with large cervicothoracic SEA, POD#5 C3-C7 lami, lateral mass fusion. Neurologically at baseline. The ventral thoracic abscess cannot reasonably be debrided or decompressed due to its location, extent, and based on intraoperative findings of phlegmon. Still bacteremic on nafcillin plus rifampin  - since TEE is negative. Recommend to get abd/pelvic CT to ensure there are no other nidus of infection. This is likely due to large burden of epidural abscess. Per dr Kathyrn Sheriff, she is maximally treated from surgery standpoint since ventral aspect infection appears to be more phlegmon-like.    [name] [facility] for Infectious Diseases Cell: [phone] Pager: [phone]  09/20/2017, 5:29 PM

## 2017-09-20 NOTE — Anesthesia Postprocedure Evaluation (Signed)
Anesthesia Post Note  Patient: [name]  Procedure(s) Performed: TRANSESOPHAGEAL ECHOCARDIOGRAM (TEE) (N/A )     Patient location during evaluation: PACU Anesthesia Type: MAC Level of consciousness: awake and alert Pain management: pain level controlled Vital Signs Assessment: post-procedure vital signs reviewed and stable Respiratory status: spontaneous breathing, nonlabored ventilation and respiratory function stable Cardiovascular status: stable and blood pressure returned to baseline Postop Assessment: no apparent nausea or vomiting Anesthetic complications: no    Last Vitals:  Vitals:   09/20/17 1220 09/20/17 1309  BP: (!) 115/50   Pulse: 70   Resp: 18   Temp:  36.8 C  SpO2: 95%     Last Pain:  Vitals:   09/20/17 1159  TempSrc: Oral  PainSc:                  Lynda Rainwater

## 2017-09-20 NOTE — Clinical Social Work Note (Signed)
CSW acknowledges SNF consult. PT recommending HHPT.  CSW signing off. Consult again if any other social work needs arise.  Kashia Brossard, CSW [phone]  

## 2017-09-20 NOTE — Progress Notes (Signed)
Physical Therapy Treatment Patient Details Name: [name] MRN: [id number] DOB: 06/12/1966 Today's Date: 09/20/2017    History of Present Illness 51 year old female w/ a history of IV drug use, hep C and chronic back pain status post previous back surgery who presented to the ED with severe back and neck pain.  An MRI of the T and L-spine revealed a ventral epidural abscess C2-t8. 1/21 Pt with emergent C3-7 lami and epidural abscess debridement    PT Comments    Pt progressing with activity with continued complaint of pain with all sitting and standing activity. Pt educated for and assisted with donning collar in bed. Pt educated for importance of mobility, gait and progression. Will continue to follow with pt benefiting from encouragement and reassurance.     Follow Up Recommendations  Home health PT;Supervision for mobility/OOB     Equipment Recommendations  Rolling walker with 5" wheels    Recommendations for Other Services       Precautions / Restrictions Precautions Precautions: Cervical;Fall Precaution Comments: Reviewed cervical precautions with pt Required Braces or Orthoses: Cervical Brace Cervical Brace: Hard collar;At all times(off in bed)    Mobility  Bed Mobility Overal bed mobility: Needs Assistance Bed Mobility: Rolling;Sidelying to Sit Rolling: Supervision Sidelying to sit: Supervision       General bed mobility comments: cues for holding neck still with donning brace prior to mobility, use of rail and bed flat  Transfers Overall transfer level: Modified independent   Transfers: Sit to/from Stand              Ambulation/Gait Ambulation/Gait assistance: Min guard Ambulation Distance (Feet): 325 Feet Assistive device: Rolling walker (2 wheeled) Gait Pattern/deviations: Step-through pattern;Decreased stride length   Gait velocity interpretation: Below normal speed for age/gender General Gait Details: pt slow and guarding with gait  without obvious balance deficit, cues to step into RW, look ahead and depress shoulders   Stairs Stairs: (pt declined attempting today)          Wheelchair Mobility    Modified Rankin (Stroke Patients Only)       Balance     Sitting balance-Leahy Scale: Good       Standing balance-Leahy Scale: Fair                              Cognition Arousal/Alertness: Awake/alert Behavior During Therapy: Anxious Overall Cognitive Status: Within Functional Limits for tasks assessed                                        Exercises      General Comments        Pertinent Vitals/Pain Pain Score: 8  Pain Location: neck with mobility Pain Descriptors / Indicators: Grimacing;Guarding Pain Intervention(s): Limited activity within patient's tolerance;Repositioned;Monitored during session;Premedicated before session    Home Living                      Prior Function            PT Goals (current goals can now be found in the care plan section) Progress towards PT goals: Progressing toward goals    Frequency    Min 5X/week      PT Plan Current plan remains appropriate    Co-evaluation  AM-PAC PT "6 Clicks" Daily Activity  Outcome Measure  Difficulty turning over in bed (including adjusting bedclothes, sheets and blankets)?: A Little Difficulty moving from lying on back to sitting on the side of the bed? : A Little Difficulty sitting down on and standing up from a chair with arms (e.g., wheelchair, bedside commode, etc,.)?: A Little Help needed moving to and from a bed to chair (including a wheelchair)?: A Little Help needed walking in hospital room?: A Little Help needed climbing 3-5 steps with a railing? : A Little 6 Click Score: 18    End of Session Equipment Utilized During Treatment: Gait belt;Cervical collar Activity Tolerance: Patient tolerated treatment well;Patient limited by pain Patient left: with  call bell/phone within reach;in chair Nurse Communication: Mobility status PT Visit Diagnosis: Other abnormalities of gait and mobility (R26.89)     Time: 4132-4401 PT Time Calculation (min) (ACUTE ONLY): 24 min  Charges:  $Gait Training: 8-22 mins $Therapeutic Activity: 8-22 mins                    G Codes:       Elwyn Reach, PT [phone]    [place] 09/20/2017, 8:53 AM

## 2017-09-20 NOTE — Anesthesia Procedure Notes (Signed)
Procedure Name: MAC Date/Time: 09/20/2017 11:35 AM Performed by: Colin Benton, CRNA Pre-anesthesia Checklist: Patient identified, Suction available, Patient being monitored and Emergency Drugs available Patient Re-evaluated:Patient Re-evaluated prior to induction Oxygen Delivery Method: Nasal cannula Induction Type: IV induction

## 2017-09-21 ENCOUNTER — Inpatient Hospital Stay (HOSPITAL_COMMUNITY): Payer: Self-pay

## 2017-09-21 ENCOUNTER — Encounter (HOSPITAL_COMMUNITY): Payer: Self-pay | Admitting: Radiology

## 2017-09-21 DIAGNOSIS — F1910 Other psychoactive substance abuse, uncomplicated: Secondary | ICD-10-CM

## 2017-09-21 DIAGNOSIS — G894 Chronic pain syndrome: Secondary | ICD-10-CM

## 2017-09-21 LAB — BASIC METABOLIC PANEL
Anion gap: 10 (ref 5–15)
BUN: 5 mg/dL — ABNORMAL LOW (ref 6–20)
CHLORIDE: 97 mmol/L — AB (ref 101–111)
CO2: 25 mmol/L (ref 22–32)
CREATININE: 0.62 mg/dL (ref 0.44–1.00)
Calcium: 7.7 mg/dL — ABNORMAL LOW (ref 8.9–10.3)
GFR calc non Af Amer: >60 mL/min (ref >60)
Glucose, Bld: 141 mg/dL — ABNORMAL HIGH (ref 65–99)
POTASSIUM: 3.6 mmol/L (ref 3.5–5.1)
Sodium: 132 mmol/L — ABNORMAL LOW (ref 135–145)

## 2017-09-21 LAB — CBC
HCT: 29.8 % — ABNORMAL LOW (ref 36.0–46.0)
Hemoglobin: 9.8 g/dL — ABNORMAL LOW (ref 12.0–15.0)
MCH: 30 pg (ref 26.0–34.0)
MCHC: 32.9 g/dL (ref 30.0–36.0)
MCV: 91.1 fL (ref 78.0–100.0)
PLATELETS: 594 10*3/uL — AB (ref 150–400)
RBC: 3.27 MIL/uL — ABNORMAL LOW (ref 3.87–5.11)
RDW: 16.1 % — AB (ref 11.5–15.5)
WBC: 14 10*3/uL — AB (ref 4.0–10.5)

## 2017-09-21 MED ORDER — OXYCODONE HCL ER 10 MG PO T12A
10.0000 mg | EXTENDED_RELEASE_TABLET | Freq: Two times a day (BID) | ORAL | Status: DC
  Administered 2017-09-21 – 2017-11-03 (×84): 10 mg via ORAL
  Filled 2017-09-21 (×87): qty 1

## 2017-09-21 MED ORDER — HYDROMORPHONE HCL 1 MG/ML IJ SOLN
1.0000 mg | Freq: Once | INTRAMUSCULAR | Status: AC
  Administered 2017-09-21: 1 mg via INTRAVENOUS
  Filled 2017-09-21: qty 1

## 2017-09-21 MED ORDER — DULOXETINE HCL 30 MG PO CPEP
30.0000 mg | ORAL_CAPSULE | Freq: Every day | ORAL | Status: DC
  Administered 2017-09-21 – 2017-09-22 (×2): 30 mg via ORAL
  Filled 2017-09-21 (×2): qty 1

## 2017-09-21 MED ORDER — OXYCODONE HCL 5 MG PO TABS
5.0000 mg | ORAL_TABLET | Freq: Four times a day (QID) | ORAL | Status: DC | PRN
  Administered 2017-09-22 – 2017-09-23 (×5): 5 mg via ORAL
  Filled 2017-09-21 (×5): qty 1

## 2017-09-21 MED ORDER — IOPAMIDOL (ISOVUE-300) INJECTION 61%
INTRAVENOUS | Status: AC
  Administered 2017-09-21: 100 mL
  Filled 2017-09-21: qty 100

## 2017-09-21 MED ORDER — IOPAMIDOL (ISOVUE-300) INJECTION 61%
INTRAVENOUS | Status: AC
  Filled 2017-09-21: qty 30

## 2017-09-21 NOTE — Progress Notes (Signed)
PROGRESS NOTE    [name]  XNA:[id number] DOB: [birth year] DOA: 09/11/2017 PCP: Marliss Coots, NP   Brief Narrative:  51 year old WF PMHx  PTSD, Depression IV drug use, Chronic Back Pain S/P previous back surgery ADD, Bilateral ovarian cysts,  PID, chlamydia, sciatica,    who presented to the ED with severe back and neck pain.  An MRI of the T and L-spine revealed a ventral epidural abscess.  Patient was also noted to have MSSA bacteremia.     Patient was placed empirically on IV antibiotics.  ID and neurosurgery consulted.  Patient was subsequently transferred to [facility]    Subjective: 11/27 A/O 4, negative CP, negative SOB, positive back pain especially C-spine, negative abdominal pain, negative N/V. States ambulated yesterday.   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 Carlyle Basques ID concerning treatment plan. Patient will require 8 weeks of IV antibiotics. -Obtain CT abdomen pelvis per ID recommendation -TEE negative vegetations -SNF placement pending    Hypokalemia Cont to supplement and follow    Polysubstance abuse/IV drug use  -Review of EMR shows Possessed suspected drugs and definite drug paraphernalia at time of admission - no clear evidence of withdrawal at this time - DO NOT SEND  HOME w/ IV access  -Patient cannot be discharged with IV access  Acute on Chronic pain syndrome/Pain management -DC IV narcotics -Cymbalta 30 mg  daily -Methocarbamol 500 mg TID -OxyContin 10 mg BID -OxyIR PRN   Hep C+ Will need f/u after d/c in the ID clinic   Morbid Obesity - Body mass index is 41.21 kg/m.    DVT prophylaxis: SCD Code Status: Full Family Communication: None Disposition Plan: TBD   Consultants:  Hematology/ Oncology ID Neurosurgery   Procedures/Significant Events:     I have personally reviewed and interpreted all radiology studies and my findings are as above.  VENTILATOR SETTINGS:    Cultures   Antimicrobials: Anti-infectives (From admission, onward)   Start     Stop   09/16/17 2300  nafcillin 2 g in dextrose 5 % 100 mL IVPB  Status:  Discontinued     09/15/17 2303   09/16/17 1400  rifampin (RIFADIN) capsule 300 mg         09/15/17 2315  nafcillin 2 g in dextrose 5 % 100 mL IVPB         09/15/17 1237  bacitracin 50,000 Units in sodium chloride irrigation 0.9 % 500 mL irrigation  Status:  Discontinued     09/15/17 1433   09/13/17 1200  nafcillin 2 g in dextrose 5 % 100 mL IVPB  Status:  Discontinued     09/15/17 2035   09/12/17 1400  ceFAZolin (ANCEF) IVPB 2g/100 mL premix  Status:  Discontinued     09/13/17 0918   09/12/17 1000  vancomycin (VANCOCIN) 1,250 mg in sodium chloride 0.9 % 250 mL IVPB  Status:  Discontinued  09/12/17 0828   09/12/17 0600  ceFEPIme (MAXIPIME) 1 g in dextrose 5 % 50 mL IVPB  Status:  Discontinued     09/12/17 0827   09/11/17 1830  vancomycin (VANCOCIN) 2,000 mg in sodium chloride 0.9 % 500 mL IVPB     09/12/17 0026   09/11/17 1815  ceFEPIme (MAXIPIME) 2 g in dextrose 5 % 50 mL IVPB     09/11/17 2208   09/11/17 1800  piperacillin-tazobactam (ZOSYN) IVPB 3.375 g  Status:  Discontinued     09/11/17 1808       Devices    LINES / TUBES:      Continuous Infusions: . nafcillin IV Stopped (09/21/17 0856)     Objective: Vitals:   09/21/17 0300 09/21/17 0400 09/21/17 0500 09/21/17 0600  BP:  138/87    Pulse: 75 83 80 65  Resp: (!) 23 (!)  33 20 (!) 22  Temp:  98.5 F (36.9 C)    TempSrc:  Oral    SpO2: 99% 95% 97% 95%  Weight:  244 lb 14.9 oz (111.1 kg)    Height:        Intake/Output Summary (Last 24 hours) at 09/21/2017 9242 Last data filed at 09/21/2017 0600 Gross per 24 hour  Intake 1742 ml  Output 3600 ml  Net -1858 ml   Filed Weights   09/13/17 0730 09/14/17 0831 09/21/17 0400  Weight: 258 lb 13.1 oz (117.4 kg) 255 lb 4.7 oz (115.8 kg) 244 lb 14.9 oz (111.1 kg)    Examination:  General: A/O 4, No acute respiratory distress Neck:  Negative scars, masses, torticollis, lymphadenopathy, JVD, right IJ CVL present covering clean negative sign of infection Lungs: Clear to auscultation bilaterally without wheezes or crackles Cardiovascular: Regular rate and rhythm without murmur gallop or rub normal S1 and S2 Abdomen: MORBIDLY OBESE, negative abdominal pain, nondistended, positive soft, bowel sounds, no rebound, no ascites, no appreciable mass Extremities: No significant cyanosis, clubbing, or edema bilateral lower extremities Skin: Negative rashes, lesions, ulcers Psychiatric:  Negative depression, negative anxiety, negative fatigue, negative mania  Central nervous system:  Cranial nerves II through XII intact, tongue/uvula midline, all extremities muscle strength 5/5, sensation intact throughout,  negative dysarthria, negative expressive aphasia, negative receptive aphasia.  .     Data Reviewed: Care during the described time interval was provided by me .  I have reviewed this patient's available data, including medical history, events of note, physical examination, and all test results as part of my evaluation.   CBC: Recent Labs  Lab 09/15/17 0557 09/16/17 0431 09/19/17 0324 09/21/17 0459  WBC 21.1* 15.0* 16.9* 14.0*  HGB 11.7* 10.6* 10.0* 9.8*  HCT 35.3* 31.5* 29.9* 29.8*  MCV 91.7 89.7 89.5 91.1  PLT 362 350 377 683*   Basic Metabolic Panel: Recent Labs  Lab 09/16/17 0431 09/19/17 0324  09/19/17 2300 09/20/17 0357 09/21/17 0459  NA 137 133* 130* 135 132*  K 3.8 3.0* 3.2* 3.4* 3.6  CL 101 98* 97* 101 97*  CO2 29 28 27 28 25   GLUCOSE 128* 112* 101* 103* 141*  BUN 8 5* 5* 5* 5*  CREATININE 0.63 0.57 0.57 0.54 0.62  CALCIUM 7.4* 7.6* 7.5* 7.7* 7.7*   GFR: Estimated Creatinine Clearance: 108.6 mL/min (by C-G formula based on SCr of 0.62 mg/dL). Liver Function Tests: Recent Labs  Lab 09/15/17 1822 09/16/17 0431 09/19/17 0324  AST  --  24 22  ALT 15 12* 13*  ALKPHOS  --  621* 371*  BILITOT  --  3.4* 2.3*  PROT  --  5.2* 5.7*  ALBUMIN  --  1.7* 1.7*   No results for input(s): LIPASE, AMYLASE in the last 168 hours. No results for input(s): AMMONIA in the last 168 hours. Coagulation Profile: Recent Labs  Lab 09/15/17 0803  INR 1.19   Cardiac Enzymes: No results for input(s): CKTOTAL, CKMB, CKMBINDEX, TROPONINI in the last 168 hours. BNP (last 3 results) No results for input(s): PROBNP in the last 8760 hours. HbA1C: No results for input(s): HGBA1C in the last 72 hours. CBG: No results for input(s): GLUCAP in the last 168 hours. Lipid Profile: No results for input(s): CHOL, HDL, LDLCALC, TRIG, CHOLHDL, LDLDIRECT in the last 72 hours. Thyroid Function Tests: No results for input(s): TSH, T4TOTAL, FREET4, T3FREE, THYROIDAB in the last 72 hours. Anemia Panel: No results for input(s): VITAMINB12, FOLATE, FERRITIN, TIBC, IRON, RETICCTPCT in the last 72 hours. Urine analysis:    Component Value Date/Time   COLORURINE YELLOW 09/11/2017 0709   APPEARANCEUR HAZY (A) 09/11/2017 0709   LABSPEC 1.020 09/11/2017 0709   PHURINE 6.0 09/11/2017 0709   GLUCOSEU NEGATIVE 09/11/2017 0709   HGBUR SMALL (A) 09/11/2017 0709   BILIRUBINUR NEGATIVE 09/11/2017 0709   KETONESUR NEGATIVE 09/11/2017 0709   PROTEINUR NEGATIVE 09/11/2017 0709   UROBILINOGEN 0.2 06/27/2015 0225   NITRITE NEGATIVE 09/11/2017 0709   LEUKOCYTESUR LARGE (A) 09/11/2017 0709   Sepsis  Labs: @LABRCNTIP (procalcitonin:4,lacticidven:4)  ) Recent Results (from the past 240 hour(s))  Blood culture (routine x 2)     Status: Abnormal   Collection Time: 09/11/17  9:20 AM  Result Value Ref Range Status   Specimen Description BLOOD RIGHT ANTECUBITAL  Final   Special Requests   Final    BOTTLES DRAWN AEROBIC AND ANAEROBIC Blood Culture adequate volume   Culture  Setup Time   Final    GRAM POSITIVE COCCI IN CLUSTERS IN BOTH AEROBIC AND ANAEROBIC BOTTLES CRITICAL RESULT CALLED TO, READ BACK BY AND VERIFIED WITH: Novella Rob 0160 09/12/2017 Mena Goes Performed at [facility], [place] [address] [place], [place] [address]    Culture STAPHYLOCOCCUS AUREUS (A)  Final   Report Status 09/14/2017 FINAL  Final   Organism ID, Bacteria STAPHYLOCOCCUS AUREUS  Final      Susceptibility   Staphylococcus aureus - MIC*    CIPROFLOXACIN <=0.5 SENSITIVE Sensitive     ERYTHROMYCIN >=8 RESISTANT Resistant     GENTAMICIN <=0.5 SENSITIVE Sensitive     OXACILLIN 0.5 SENSITIVE Sensitive     TETRACYCLINE <=1 SENSITIVE Sensitive     VANCOMYCIN <=0.5 SENSITIVE Sensitive     TRIMETH/SULFA <=10 SENSITIVE Sensitive     CLINDAMYCIN <=0.25 SENSITIVE Sensitive     RIFAMPIN <=0.5 SENSITIVE Sensitive     Inducible Clindamycin NEGATIVE Sensitive     * STAPHYLOCOCCUS AUREUS  Blood Culture ID Panel (Reflexed)     Status: Abnormal   Collection Time: 09/11/17  9:20 AM  Result Value Ref Range Status   Enterococcus species NOT DETECTED NOT DETECTED Final   Listeria monocytogenes NOT DETECTED NOT DETECTED Final   Staphylococcus species DETECTED (A) NOT DETECTED Final    Comment: CRITICAL RESULT CALLED TO, READ BACK BY AND VERIFIED WITH: B. GREEN,PHARMD [id number] 09/12/2017 T. TYSOR    Staphylococcus aureus DETECTED (A) NOT DETECTED Final    Comment: Methicillin (oxacillin) susceptible Staphylococcus aureus (MSSA). Preferred therapy is anti staphylococcal beta lactam antibiotic (Cefazolin or Nafcillin),  unless clinically contraindicated. CRITICAL RESULT CALLED TO, READ BACK  BY AND VERIFIED WITH: B. GREEN,PHARMD 0347 09/12/2017 T. TYSOR    Methicillin resistance NOT DETECTED NOT DETECTED Final   Streptococcus species NOT DETECTED NOT DETECTED Final   Streptococcus agalactiae NOT DETECTED NOT DETECTED Final   Streptococcus pneumoniae NOT DETECTED NOT DETECTED Final   Streptococcus pyogenes NOT DETECTED NOT DETECTED Final   Acinetobacter baumannii NOT DETECTED NOT DETECTED Final   Enterobacteriaceae species NOT DETECTED NOT DETECTED Final   Enterobacter cloacae complex NOT DETECTED NOT DETECTED Final   Escherichia coli NOT DETECTED NOT DETECTED Final   Klebsiella oxytoca NOT DETECTED NOT DETECTED Final   Klebsiella pneumoniae NOT DETECTED NOT DETECTED Final   Proteus species NOT DETECTED NOT DETECTED Final   Serratia marcescens NOT DETECTED NOT DETECTED Final   Haemophilus influenzae NOT DETECTED NOT DETECTED Final   Neisseria meningitidis NOT DETECTED NOT DETECTED Final   Pseudomonas aeruginosa NOT DETECTED NOT DETECTED Final   Candida albicans NOT DETECTED NOT DETECTED Final   Candida glabrata NOT DETECTED NOT DETECTED Final   Candida krusei NOT DETECTED NOT DETECTED Final   Candida parapsilosis NOT DETECTED NOT DETECTED Final   Candida tropicalis NOT DETECTED NOT DETECTED Final    Comment: Performed at [facility], [place] [address], [place], [place] [address]  Blood culture (routine x 2)     Status: Abnormal   Collection Time: 09/11/17  9:33 AM  Result Value Ref Range Status   Specimen Description BLOOD BLOOD LEFT HAND  Final   Special Requests   Final    BOTTLES DRAWN AEROBIC AND ANAEROBIC Blood Culture adequate volume   Culture  Setup Time   Final    GRAM POSITIVE COCCI IN CLUSTERS IN BOTH AEROBIC AND ANAEROBIC BOTTLES CRITICAL VALUE NOTED.  VALUE IS CONSISTENT WITH PREVIOUSLY REPORTED AND CALLED VALUE.    Culture (A)  Final    STAPHYLOCOCCUS AUREUS SUSCEPTIBILITIES  PERFORMED ON PREVIOUS CULTURE WITHIN THE LAST 5 DAYS. Performed at [facility], [place] [address] [place], [place] [address]    Report Status 09/14/2017 FINAL  Final  MRSA PCR Screening     Status: None   Collection Time: 09/11/17  1:03 PM  Result Value Ref Range Status   MRSA by PCR NEGATIVE NEGATIVE Final    Comment:        The GeneXpert MRSA Assay (FDA approved for NASAL specimens only), is one component of a comprehensive MRSA colonization surveillance program. It is not intended to diagnose MRSA infection nor to guide or monitor treatment for MRSA infections.   Culture, blood (Routine X 2) w Reflex to ID Panel     Status: Abnormal   Collection Time: 09/12/17  9:00 AM  Result Value Ref Range Status   Specimen Description BLOOD RIGHT HAND  Final   Special Requests IN PEDIATRIC BOTTLE Blood Culture adequate volume  Final   Culture  Setup Time   Final    GRAM POSITIVE COCCI IN CLUSTERS IN PEDIATRIC BOTTLE CRITICAL RESULT CALLED TO, READ BACK BY AND VERIFIED WITH: K. Caroline Sauger 1540 09/13/2017 T. TYSOR    Culture (A)  Final    STAPHYLOCOCCUS AUREUS SUSCEPTIBILITIES PERFORMED ON PREVIOUS CULTURE WITHIN THE LAST 5 DAYS. Performed at [facility], [place] [address], [place],  [address]    Report Status 09/15/2017 FINAL  Final  Culture, blood (Routine X 2) w Reflex to ID Panel     Status: Abnormal   Collection Time: 09/12/17  9:15 AM  Result Value Ref Range Status  Specimen Description BLOOD BLOOD RIGHT HAND  Final   Special Requests IN PEDIATRIC BOTTLE Blood Culture adequate volume  Final   Culture  Setup Time   Final    GRAM POSITIVE COCCI IN CLUSTERS CRITICAL RESULT CALLED TO, READ BACK BY AND VERIFIED WITH: K. WEIGLE,PHARMD [id number] 09/13/2017 T. TYSOR    Culture (A)  Final    STAPHYLOCOCCUS AUREUS SUSCEPTIBILITIES PERFORMED ON PREVIOUS CULTURE WITHIN THE LAST 5 DAYS. Performed at [facility], [place] [address], [place], [place] [address]    Report  Status 09/15/2017 FINAL  Final  Culture, Urine     Status: None   Collection Time: 09/12/17 11:45 AM  Result Value Ref Range Status   Specimen Description URINE, CLEAN CATCH  Final   Special Requests NONE  Final   Culture   Final    NO GROWTH Performed at [facility], [place] [address], [place], [place] [address]    Report Status 09/13/2017 FINAL  Final  Culture, blood (routine x 2)     Status: Abnormal   Collection Time: 09/13/17  9:02 AM  Result Value Ref Range Status   Specimen Description BLOOD LEFT HAND  Final   Special Requests IN PEDIATRIC BOTTLE Blood Culture adequate volume  Final   Culture  Setup Time   Final    GRAM POSITIVE COCCI IN CLUSTERS IN PEDIATRIC BOTTLE CRITICAL VALUE NOTED.  VALUE IS CONSISTENT WITH PREVIOUSLY REPORTED AND CALLED VALUE.    Culture (A)  Final    STAPHYLOCOCCUS AUREUS SUSCEPTIBILITIES PERFORMED ON PREVIOUS CULTURE WITHIN THE LAST 5 DAYS.    Report Status 09/17/2017 FINAL  Final  Culture, blood (routine x 2)     Status: Abnormal   Collection Time: 09/13/17  9:07 AM  Result Value Ref Range Status   Specimen Description BLOOD RIGHT HAND  Final   Special Requests IN PEDIATRIC BOTTLE Blood Culture adequate volume  Final   Culture  Setup Time   Final    GRAM POSITIVE COCCI IN CLUSTERS IN PEDIATRIC BOTTLE CRITICAL RESULT CALLED TO, READ BACK BY AND VERIFIED WITHAlona Bene PHARMD [id number] 09/14/17 A BROWNING    Culture STAPHYLOCOCCUS AUREUS (A)  Final   Report Status 09/17/2017 FINAL  Final   Organism ID, Bacteria STAPHYLOCOCCUS AUREUS  Final      Susceptibility   Staphylococcus aureus - MIC*    CIPROFLOXACIN <=0.5 SENSITIVE Sensitive     ERYTHROMYCIN >=8 RESISTANT Resistant     GENTAMICIN <=0.5 SENSITIVE Sensitive     OXACILLIN 0.5 SENSITIVE Sensitive     TETRACYCLINE <=1 SENSITIVE Sensitive     VANCOMYCIN 1 SENSITIVE Sensitive     TRIMETH/SULFA <=10 SENSITIVE Sensitive     CLINDAMYCIN <=0.25 SENSITIVE Sensitive     RIFAMPIN <=0.5 SENSITIVE  Sensitive     Inducible Clindamycin NEGATIVE Sensitive     * STAPHYLOCOCCUS AUREUS  MRSA PCR Screening     Status: None   Collection Time: 09/15/17 10:46 AM  Result Value Ref Range Status   MRSA by PCR NEGATIVE NEGATIVE Final    Comment:        The GeneXpert MRSA Assay (FDA approved for NASAL specimens only), is one component of a comprehensive MRSA colonization surveillance program. It is not intended to diagnose MRSA infection nor to guide or monitor treatment for MRSA infections.   Aerobic/Anaerobic Culture (surgical/deep wound)     Status: None   Collection Time: 09/15/17 12:33 PM  Result Value Ref Range Status   Specimen Description  ABSCESS PARASPINAL  Final   Special Requests NONE  Final   Gram Stain   Final    RARE WBC PRESENT, PREDOMINANTLY PMN FEW GRAM POSITIVE COCCI IN CLUSTERS    Culture FEW STAPHYLOCOCCUS AUREUS NO ANAEROBES ISOLATED   Final   Report Status 09/20/2017 FINAL  Final   Organism ID, Bacteria STAPHYLOCOCCUS AUREUS  Final      Susceptibility   Staphylococcus aureus - MIC*    CIPROFLOXACIN <=0.5 SENSITIVE Sensitive     ERYTHROMYCIN >=8 RESISTANT Resistant     GENTAMICIN <=0.5 SENSITIVE Sensitive     OXACILLIN <=0.25 SENSITIVE Sensitive     TETRACYCLINE <=1 SENSITIVE Sensitive     VANCOMYCIN <=0.5 SENSITIVE Sensitive     TRIMETH/SULFA <=10 SENSITIVE Sensitive     CLINDAMYCIN <=0.25 SENSITIVE Sensitive     RIFAMPIN <=0.5 SENSITIVE Sensitive     Inducible Clindamycin NEGATIVE Sensitive     * FEW STAPHYLOCOCCUS AUREUS  Aerobic/Anaerobic Culture (surgical/deep wound)     Status: None   Collection Time: 09/15/17 12:41 PM  Result Value Ref Range Status   Specimen Description ABSCESS SPINAL EPIDURAL  Final   Special Requests NONE  Final   Gram Stain   Final    RARE WBC PRESENT, PREDOMINANTLY PMN FEW GRAM POSITIVE COCCI IN CLUSTERS    Culture   Final    MODERATE STAPHYLOCOCCUS AUREUS NO ANAEROBES ISOLATED    Report Status 09/20/2017 FINAL  Final     Organism ID, Bacteria STAPHYLOCOCCUS AUREUS  Final      Susceptibility   Staphylococcus aureus - MIC*    CIPROFLOXACIN <=0.5 SENSITIVE Sensitive     ERYTHROMYCIN >=8 RESISTANT Resistant     GENTAMICIN <=0.5 SENSITIVE Sensitive     OXACILLIN 0.5 SENSITIVE Sensitive     TETRACYCLINE <=1 SENSITIVE Sensitive     VANCOMYCIN 1 SENSITIVE Sensitive     TRIMETH/SULFA <=10 SENSITIVE Sensitive     CLINDAMYCIN <=0.25 SENSITIVE Sensitive     RIFAMPIN <=0.5 SENSITIVE Sensitive     Inducible Clindamycin NEGATIVE Sensitive     * MODERATE STAPHYLOCOCCUS AUREUS  Culture, blood (Routine X 2) w Reflex to ID Panel     Status: Abnormal   Collection Time: 09/17/17  7:53 AM  Result Value Ref Range Status   Specimen Description BLOOD RIGHT HAND  Final   Special Requests IN PEDIATRIC BOTTLE Blood Culture adequate volume  Final   Culture  Setup Time   Final    GRAM POSITIVE COCCI AEROBIC BOTTLE ONLY CRITICAL VALUE NOTED.  VALUE IS CONSISTENT WITH PREVIOUSLY REPORTED AND CALLED VALUE.    Culture (A)  Final    STAPHYLOCOCCUS AUREUS SUSCEPTIBILITIES PERFORMED ON PREVIOUS CULTURE WITHIN THE LAST 5 DAYS.    Report Status 09/20/2017 FINAL  Final  Culture, blood (Routine X 2) w Reflex to ID Panel     Status: None (Preliminary result)   Collection Time: 09/19/17  8:25 AM  Result Value Ref Range Status   Specimen Description BLOOD RIGHT HAND  Final   Special Requests IN PEDIATRIC BOTTLE Blood Culture adequate volume  Final   Culture NO GROWTH 1 DAY  Final   Report Status PENDING  Incomplete         Radiology Studies: No results found.      Scheduled Meds: . Chlorhexidine Gluconate Cloth  6 each Topical Q2200  . docusate sodium  100 mg Oral BID  . polyethylene glycol  17 g Oral Daily  . rifampin  300 mg Oral Q12H  .  sodium chloride flush  10-40 mL Intracatheter Q12H   Continuous Infusions: . nafcillin IV Stopped (09/21/17 0856)     LOS: 10 days    Time spent: 40 minutes    Lailyn Appelbaum,  Geraldo Docker, MD [facility] Pager [phone]   If 7PM-7AM, please contact night-coverage [email or web address] Password [facility] 09/21/2017, 8:32 AM

## 2017-09-21 NOTE — Progress Notes (Signed)
Physical Therapy Treatment Patient Details Name: [name] MRN: [id number] DOB: [birth year] Today's Date: 09/21/2017    History of Present Illness 51 year old female w/ a history of IV drug use, hep C and chronic back pain status post previous back surgery who presented to the ED with severe back and neck pain.  An MRI of the T and L-spine revealed a ventral epidural abscess C2-t8. 1/21 Pt with emergent C3-7 lami and epidural abscess debridement    PT Comments    Pt continues to report 9/10 neck and shoulder pain at all times. Pt with increased gait speed but still slower than normal and able to perform stairs today. Pt reports she has been walking each shift and concerned about pain. Pt educated for collar wear and donning. Will continue to follow.     Follow Up Recommendations  Home health PT;Supervision for mobility/OOB     Equipment Recommendations  Rolling walker with 5" wheels    Recommendations for Other Services       Precautions / Restrictions Precautions Precautions: Cervical;Fall Required Braces or Orthoses: Cervical Brace Cervical Brace: Hard collar;At all times    Mobility  Bed Mobility Overal bed mobility: Needs Assistance Bed Mobility: Rolling;Sidelying to Sit Rolling: Modified independent (Device/Increase time) Sidelying to sit: Modified independent (Device/Increase time)       General bed mobility comments: use of rail   Transfers Overall transfer level: Needs assistance   Transfers: Sit to/from Stand Sit to Stand: Supervision         General transfer comment: cues for hand placement  Ambulation/Gait Ambulation/Gait assistance: Min guard Ambulation Distance (Feet): 325 Feet Assistive device: Rolling walker (2 wheeled) Gait Pattern/deviations: Step-through pattern;Decreased stride length   Gait velocity interpretation: Below normal speed for age/gender General Gait Details: guarding gait with periodic reports of increased neck and  shoulder pain with cues for upright posture   Stairs Stairs: Yes   Stair Management: One rail Left;Step to pattern;Forwards Number of Stairs: 10 General stair comments: pt with slow guarded gait with good stability with rail   Wheelchair Mobility    Modified Rankin (Stroke Patients Only)       Balance Overall balance assessment: Needs assistance   Sitting balance-Leahy Scale: Good       Standing balance-Leahy Scale: Fair                              Cognition Arousal/Alertness: Awake/alert Behavior During Therapy: Anxious Overall Cognitive Status: Within Functional Limits for tasks assessed                                        Exercises      General Comments        Pertinent Vitals/Pain Pain Score: 9  Pain Location: neck with mobility Pain Descriptors / Indicators: Burning Pain Intervention(s): Limited activity within patient's tolerance;Ice applied;Repositioned;Monitored during session    Home Living                      Prior Function            PT Goals (current goals can now be found in the care plan section) Progress towards PT goals: Progressing toward goals    Frequency    Min 3X/week      PT Plan Current plan remains appropriate;Frequency needs to be updated  Co-evaluation              AM-PAC PT "6 Clicks" Daily Activity  Outcome Measure  Difficulty turning over in bed (including adjusting bedclothes, sheets and blankets)?: A Little Difficulty moving from lying on back to sitting on the side of the bed? : A Little Difficulty sitting down on and standing up from a chair with arms (e.g., wheelchair, bedside commode, etc,.)?: A Little Help needed moving to and from a bed to chair (including a wheelchair)?: A Little Help needed walking in hospital room?: A Little Help needed climbing 3-5 steps with a railing? : None 6 Click Score: 19    End of Session Equipment Utilized During Treatment:  Gait belt;Cervical collar Activity Tolerance: Patient tolerated treatment well;Patient limited by pain Patient left: with call bell/phone within reach;in chair Nurse Communication: Mobility status PT Visit Diagnosis: Other abnormalities of gait and mobility (R26.89)     Time: 1275-1700 PT Time Calculation (min) (ACUTE ONLY): 24 min  Charges:  $Gait Training: 8-22 mins $Therapeutic Activity: 8-22 mins                    G Codes:       Elwyn Reach, PT [phone]    [place] 09/21/2017, 8:10 AM

## 2017-09-21 NOTE — Progress Notes (Signed)
1900: Received handoff report from RN. Pt still with poorly controlled pain, although she does endorse resting pain down to 8/10 from yesterday's 9/10. Pt is optimistic that pain will continue to decrease and that her management will continue to improve.Developed a plan with the pt for ain mgmt overnight. Pt amenable to plan.  2200: Daughter visited pt; positive experience for both. Pt continues with polyuria, despite no IV fluid maintenance, NPO status this morning, and moderate po intake after her TEE. Urine is discolored from rifampin, so it is difficult to assess color for dilution. No subjective s/s of dehydration.  0200: Pt endorses getting better sleep this shift.  0400: Pt calling out for poor pain control, but also requesting to perform physical tasks (bath, mobility) before next pain med administration. Educated on benefits of premedicating for improved activity tolerance.  0600: Pt sleeping in room.   0700: Handoff report given to RN. UOP this shift totals 3.6L

## 2017-09-21 NOTE — Progress Notes (Signed)
[facility] for Infectious Disease    Date of Admission:  09/11/2017   Total days of antibiotics 11        Day 9 nafcillin        Day 6 rifampin          ID: [name] is a 51 y.o. 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 Still having neck pain. Able to ambulate with PT. Wants to do PT/OT in order to get better  No urinary difficulties. Difficulty with wiping after BM due to strain in neck Medications:  . iopamidol      . Chlorhexidine Gluconate Cloth  6 each Topical Q2200  . docusate sodium  100 mg Oral BID  . polyethylene glycol  17 g Oral Daily  . rifampin  300 mg Oral Q12H  . sodium chloride flush  10-40 mL Intracatheter Q12H    Objective: Vital signs in last 24 hours: Temp:  [97.7 F (36.5 C)-98.5 F (36.9 C)] 97.7 F (36.5 C) (11/27 0900) Pulse Rate:  [65-89] 87 (11/27 0800) Resp:  [17-40] 24 (11/27 0800) BP: (138-150)/(87-99) 150/90 (11/27 0800) SpO2:  [94 %-100 %] 97 % (11/27 0800) Weight:  [244 lb 14.9 oz (111.1 kg)] 244 lb 14.9 oz (111.1 kg) (11/27 0400) Physical Exam  Constitutional:  oriented to person, place, and time. appears well-developed and well-nourished. No distress.  HENT: [place]/AT, PERRLA, no scleral icterus, Mouth/Throat: Oropharynx is clear and moist. No oropharyngeal exudate.  Cardiovascular:  Normal rate, regular rhythm and normal heart sounds. Exam reveals no gallop and no friction rub.  No murmur heard.  Pulmonary/Chest: Effort normal and breath sounds normal. No respiratory distress.  has no wheezes.  Abdominal: Soft. Bowel sounds are normal.  exhibits no distension. There is no tenderness.  Skin: Skin is warm and dry. No rash noted. No erythema.  Psychiatric: a normal mood and affect.  behavior is normal.   Lab Results Recent Labs    09/19/17 0324  09/20/17 0357 09/21/17 0459  WBC 16.9*  --   --  14.0*  HGB 10.0*  --   --  9.8*  HCT 29.9*  --   --  29.8*  NA 133*   < > 135 132*  K 3.0*   < > 3.4* 3.6  CL 98*   < > 101 97*  CO2 28   < > 28 25  BUN 5*   < > 5* 5*  CREATININE 0.57   < > 0.54 0.62   < > = values in this interval not displayed.   Liver Panel Recent Labs    09/19/17 0324  PROT 5.7*  ALBUMIN 1.7*  AST 22  ALT 13*  ALKPHOS 371*  BILITOT 2.3*    Microbiology: 11/25 blood cx NGTD 11/23 blood cx +  MSSA Studies/Results: No results found.  TEE = no vegetation Assessment/Plan: 51 y.o. female with large cervicothoracic SEA, POD#6 C3-C7 lami, lateral mass fusion on 11/21 Neurologically at baseline. The ventral thoracic abscess cannot reasonably be debrided or decompressed due to its location, extent, and based on intraoperative findings of phlegmon. Still bacteremic on nafcillin plus rifampin  Blood cx ngtd on 11/25. Will still wait to place picc line for the time being  Recommend to get abd/pelvic CT to ensure there are no other nidus of infection. This is likely due to large burden of epidural abscess.  She will need 8 wk of nafcillin plus rifampin. Will need SNF. Not a good candidate for home   [facility], [facility] for Infectious Diseases Cell: [phone] Pager: [phone]  09/21/2017, 4:00 PM

## 2017-09-21 NOTE — Care Management Note (Signed)
Case Management Note  Patient Details  Name: NYOMI HOWSER MRN: [id number] Date of Birth: 30-Apr-1966  Subjective/Objective:     51 year old female w/ a history of IV drug use, hep C and chronic back pain status post previous back surgery who presented to the ED with severe back and neck pain.  An MRI of the T and L-spine revealed a ventral epidural abscess C2-t8. 1/21 Pt with emergent C3-7 lami and epidural abscess debridement.  PTA, pt independent, lives in a boarding house.                Action/Plan: PT recommending HHPT, DME for home.  Pt plans to dc home with sister at dc. Pt uninsured; uncertain if pt will qualify for home therapies with current diagnosis as uninsured pt.  Will verify this.  Pt may need medication assistance at discharge.  Will follow progress.   Expected Discharge Date:                  Expected Discharge Plan:  [place]  In-House Referral:     Discharge planning Services  CM Consult  Post Acute Care Choice:    Choice offered to:     DME Arranged:    DME Agency:     HH Arranged:    [place] Agency:     Status of Service:  In process, will continue to follow  If discussed at Long Length of Stay Meetings, dates discussed:    Additional Comments:  Reinaldo Raddle, RN, BSN  Trauma/Neuro ICU Case Manager [phone]

## 2017-09-22 MED ORDER — DULOXETINE HCL 60 MG PO CPEP
60.0000 mg | ORAL_CAPSULE | Freq: Every day | ORAL | Status: DC
  Administered 2017-09-23 – 2017-11-14 (×53): 60 mg via ORAL
  Filled 2017-09-22 (×53): qty 1

## 2017-09-22 MED ORDER — METHOCARBAMOL 750 MG PO TABS
750.0000 mg | ORAL_TABLET | Freq: Three times a day (TID) | ORAL | Status: DC | PRN
  Administered 2017-09-22 – 2017-11-14 (×76): 750 mg via ORAL
  Filled 2017-09-22: qty 2
  Filled 2017-09-22: qty 1
  Filled 2017-09-22: qty 2
  Filled 2017-09-22: qty 1
  Filled 2017-09-22 (×4): qty 2
  Filled 2017-09-22: qty 1
  Filled 2017-09-22 (×6): qty 2
  Filled 2017-09-22 (×2): qty 1
  Filled 2017-09-22: qty 2
  Filled 2017-09-22: qty 1
  Filled 2017-09-22: qty 2
  Filled 2017-09-22 (×3): qty 1
  Filled 2017-09-22: qty 2
  Filled 2017-09-22: qty 1
  Filled 2017-09-22 (×2): qty 2
  Filled 2017-09-22: qty 1
  Filled 2017-09-22 (×8): qty 2
  Filled 2017-09-22: qty 1
  Filled 2017-09-22: qty 2
  Filled 2017-09-22 (×3): qty 1
  Filled 2017-09-22 (×14): qty 2
  Filled 2017-09-22: qty 1
  Filled 2017-09-22 (×2): qty 2
  Filled 2017-09-22: qty 1
  Filled 2017-09-22: qty 2
  Filled 2017-09-22: qty 1.5
  Filled 2017-09-22: qty 2
  Filled 2017-09-22: qty 1
  Filled 2017-09-22 (×3): qty 2
  Filled 2017-09-22: qty 1
  Filled 2017-09-22: qty 2
  Filled 2017-09-22 (×2): qty 1
  Filled 2017-09-22 (×5): qty 2
  Filled 2017-09-22 (×3): qty 1

## 2017-09-22 NOTE — Progress Notes (Signed)
[facility] for Infectious Disease    Date of Admission:  09/11/2017   Total days of antibiotics 12        Day 10 nafcillin        Day 7 rifampin        ID: [name] is a 51 y.o. female  with large cervicothoracic SEA 2/2 MSSA, POD#7 C3-C7 lami, lateral mass fusion. The ventral thoracic abscess cannot reasonably be debrided or decompressed due to its location, extent, and based on intraoperative findings, the fact that it is more phlegmon rather than liquid. Intra-op culture with MSSA. TEE negative for endocarditis. Repeat Bcx 11/25 NGTD.  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 Had hard time sleeping. Denied further fevers or chills. Denies numbness or tingling. No worsening weakness.   Medications:  . Chlorhexidine Gluconate Cloth  6 each Topical Q2200  . docusate sodium  100 mg Oral BID  . DULoxetine  30 mg Oral Daily  . oxyCODONE  10 mg Oral Q12H  . polyethylene glycol  17 g Oral Daily  . rifampin  300 mg Oral Q12H  . sodium chloride flush  10-40 mL Intracatheter Q12H   Objective: Vital signs in last 24 hours: Temp:  [97.9 F (36.6 C)-98.8 F (37.1 C)] 97.9 F (36.6 C) (11/28 0741) Pulse Rate:  [69-88] 69 (11/28 0400) Resp:  [19-28] 28 (11/28 0400) BP: (128-155)/(80-88) 155/86 (11/28 0400) SpO2:  [96 %-99 %] 99 % (11/28 0400) Physical Exam  Constitutional: Sleeping comfortably upon entering room, awakens easily. Appears well-developed and well-nourished. No distress.  Mouth/Throat: Oropharynx is clear and moist. No oropharyngeal  exudate. No thrush.  Cardiovascular: Normal rate, regular rhythm and normal heart sounds. No murmur heard.  Pulmonary/Chest: Effort normal and breath sounds normal. No respiratory distress,  Breathing comfortably on RA. Abdominal: Soft, obese abdomen. Bowel sounds are normal. Not distended.  Extremities: Able to move all 4 extremities spontaneously. Strength and sensation grossly intact BL UE and LE.  Skin: Skin is warm and dry. No rash noted. No erythema.   Lab Results Recent Labs    09/20/17 0357 09/21/17 0459  WBC  --  14.0*  HGB  --  9.8*  HCT  --  29.8*  NA 135 132*  K 3.4* 3.6  CL 101 97*  CO2 28 25  BUN 5* 5*  CREATININE 0.54 0.62   Liver Panel No results for input(s): PROT, ALBUMIN, AST, ALT, ALKPHOS, BILITOT, BILIDIR, IBILI in the last 72 hours.  Microbiology: 11/26 intra-op culture MSSA 11/25 blood cx NGTD 11/23 blood cx + MSSA Studies/Results: Ct Abdomen Pelvis W Contrast  Result Date: 09/21/2017 CLINICAL DATA:  Bacteremia. Known thoracic and cervical abscess with cervical decompression 6 days ago. EXAM: CT ABDOMEN AND PELVIS WITH CONTRAST TECHNIQUE: Multidetector CT imaging of the abdomen and pelvis was performed using the standard protocol following bolus administration of intravenous contrast. CONTRAST:  100 cc Isovue-300 intravenous COMPARISON:  09/11/2017 FINDINGS: Lower chest: Progression of posterior mediastinal disease centered at T7 and  T8 with discrete paravertebral abscesses bilaterally and ventrally. The collection on the right (which may communicate to the left) measures up to 21 x 41 mm on axial slices. Small bilateral pleural effusion that are dependent and without visible septation or pleural thickening. Lower lobe atelectasis. Hepatobiliary: No focal liver abnormality.No evidence of biliary obstruction or stone. Pancreas: Unremarkable. Spleen: Unremarkable. Adrenals/Urinary Tract: Negative adrenals. No hydronephrosis or stone. Subcentimeter presumed cyst  in the left kidney. Gas in the urinary bladder without bladder wall thickening or inflammation. Stomach/Bowel:  No obstruction. No appendicitis. Vascular/Lymphatic: No acute vascular abnormality. No mass or adenopathy. Reproductive:Hysterectomy. Tubular cystic structure in the right pelvis measuring up to 2 cm in thickness, length measurement limited by redundant shape. This is the appearance of hydrosalpinx. No wall thickening or surrounding fat inflammation. Other: No ascites or pneumoperitoneum. Musculoskeletal: No progressive or visible destructive changes in the thoracic spine. L3-S1 solid fusion. Known epidural abscess in the thoracic spine not well assessed by CT. IMPRESSION: 1. Progressive paravertebral infection at T7 and T8 with collections/abscess measuring up to 4 cm. No osteomyelitis by CT. 2. Small bilateral pleural effusions without visible complexity. Lower lobe atelectasis. 3. No abscess or other acute finding in the abdomen. 4. Right hydrosalpinx. Recommend pelvic ultrasound confirmation after convalescence. 5. Gas in the urinary bladder without wall thickening, please correlate with urinalysis. Electronically Signed   By: Monte Fantasia M.D.   On: 09/21/2017 21:12   TEE 11/26 = no vegetation  Assessment/Plan:1) MSSA bacteremia, large cervicothoracic SEA 51 y.o. female with MSSA bacteremia & large cervicothoracic SEA, POD#7 C3-C7 lami & lateral mass fusion on 11/21. Neurologically at baseline although still with significant pain. The ventral thoracic abscess cannot reasonably be debrided or decompressed due to its location, extent, and based on intraoperative findings of phlegmon. Intra-op cultures with MSSA. On Nafcillin plus rifampin. Blood cultures 11/25 without growth.  -CT obtained yesterday with progressive paravertebral infection at T7 &T8 and 4cm collection/abscess appreciated. No osteo appreciated on CT. No abscess or acute finding appreciated in the abdomen.  -Will need 8 weeks of  Nafcillin & Rifampin  -Will need SNF placement as not good candidate for home treament  Einar Gip, DO [place] for Infectious Diseases Cell: [phone] Pager: [phone]  09/22/2017, 9:15 AM

## 2017-09-22 NOTE — Progress Notes (Signed)
Noted in Infectious Disease MD's notes that pt will need 8 weeks total of Nafcillin and Rifampin.  Pt will need SNF placement, as she cannot go home with PICC line due to hx of IV drug use.  CSW [name] consulted to facilitate dc to SNF; placement likely difficult due to drug hx and no payor source.  Will follow.  Reinaldo Raddle, RN, BSN  Trauma/Neuro ICU Case Manager [phone]

## 2017-09-22 NOTE — Progress Notes (Signed)
Physical Therapy Treatment Patient Details Name: [name] MRN: [id number] DOB: [birth year] Today's Date: 09/22/2017    History of Present Illness 51 year old female w/ a history of IV drug use, hep C and chronic back pain status post previous back surgery who presented to the ED with severe back and neck pain.  An MRI of the T and L-spine revealed a ventral epidural abscess C2-t8. 1/21 Pt with emergent C3-7 lami and epidural abscess debridement    PT Comments    Patient not progressing this session due to pain and limited activity tolerance, though does relate she worked with OT earlier today.  Assisted to [facility] for toileting and encouraged mobility to [facility] instead of replacing the purewick.  Will continue to progress mobility as tolerated.  Remains appropriate for HHPT at d/c.  Follow Up Recommendations  Home health PT;Supervision for mobility/OOB     Equipment Recommendations  Rolling walker with 5" wheels    Recommendations for Other Services       Precautions / Restrictions Precautions Precautions: Cervical;Fall Required Braces or Orthoses: Cervical Brace Cervical Brace: Hard collar;At all times    Mobility  Bed Mobility     Rolling: Modified independent (Device/Increase time) Sidelying to sit: Modified independent (Device/Increase time)     Sit to sidelying: Modified independent (Device/Increase time) General bed mobility comments: assisted with lines  Transfers Overall transfer level: Needs assistance Equipment used: None Transfers: Sit to/from [employer] Transfers Sit to Stand: Supervision Stand pivot transfers: Supervision       General transfer comment: assist for safety; to and from [facility] only due to pt reporting more pain today with changes in medications  Ambulation/Gait                 Stairs            Wheelchair Mobility    Modified Rankin (Stroke Patients Only)       Balance             Standing  balance-Leahy Scale: Good                              Cognition Arousal/Alertness: Awake/alert Behavior During Therapy: Anxious Overall Cognitive Status: Within Functional Limits for tasks assessed                                        Exercises      General Comments        Pertinent Vitals/Pain Pain Score: 9  Pain Location: neck with mobility Pain Descriptors / Indicators: Discomfort;Burning Pain Intervention(s): Monitored during session;Limited activity within patient's tolerance;Other (comment)(RN aware)    Home Living                      Prior Function            PT Goals (current goals can now be found in the care plan section) Progress towards PT goals: Not progressing toward goals - comment(limited this session due to pain)    Frequency    Min 3X/week      PT Plan Current plan remains appropriate    Co-evaluation              AM-PAC PT "6 Clicks" Daily Activity  Outcome Measure  Difficulty turning over in bed (including adjusting bedclothes, sheets and blankets)?: A  Little Difficulty moving from lying on back to sitting on the side of the bed? : A Little Difficulty sitting down on and standing up from a chair with arms (e.g., wheelchair, bedside commode, etc,.)?: A Little Help needed moving to and from a bed to chair (including a wheelchair)?: A Little Help needed walking in hospital room?: A Little Help needed climbing 3-5 steps with a railing? : A Little 6 Click Score: 18    End of Session Equipment Utilized During Treatment: Cervical collar Activity Tolerance: Patient limited by pain Patient left: in bed;with call bell/phone within reach;with bed alarm set   PT Visit Diagnosis: Other abnormalities of gait and mobility (R26.89)     Time: 0737-1062 PT Time Calculation (min) (ACUTE ONLY): 16 min  Charges:  $Therapeutic Activity: 8-22 mins                    G CodesMagda Kiel,  [place] [phone] 09/22/2017    Reginia Naas 09/22/2017, 4:59 PM

## 2017-09-22 NOTE — Progress Notes (Signed)
Occupational Therapy Treatment Patient Details Name: [name] MRN: [id number] DOB: Sep 01, 1966 Today's Date: 09/22/2017    History of present illness 51 year old female w/ a history of IV drug use, hep C and chronic back pain status post previous back surgery who presented to the ED with severe back and neck pain.  An MRI of the T and L-spine revealed a ventral epidural abscess C2-t8. 1/21 Pt with emergent C3-7 lami and epidural abscess debridement   OT comments  Pt progressing towards established OT goals. Continues to report significant pain which limited pt's occupational performance and participation. Pt performing toilet transfer with Min guard and toilet hygiene with AE after BM. During toilet hygiene, pt requiring Min guard for safety and VCs for correct sequencing of toilet aide. Pt requiring Max A for donning cervical collar. Will continue to follow acutely to facilitate safe dc.   Follow Up Recommendations  DC plan and follow up therapy as arranged by surgeon;Supervision - Intermittent    Equipment Recommendations  3 in 1 bedside commode    Recommendations for Other Services      Precautions / Restrictions Precautions Precautions: Cervical;Fall Precaution Comments: Reviewed cervical precautions with pt Required Braces or Orthoses: Cervical Brace Cervical Brace: Hard collar;At all times Restrictions Weight Bearing Restrictions: No       Mobility Bed Mobility Overal bed mobility: Needs Assistance Bed Mobility: Rolling;Sidelying to Sit Rolling: Modified independent (Device/Increase time) Sidelying to sit: Modified independent (Device/Increase time)     Sit to sidelying: Modified independent (Device/Increase time) General bed mobility comments: assisted with lines  Transfers Overall transfer level: Needs assistance Equipment used: None Transfers: Sit to/from [employer] Sit to Stand: Supervision Stand pivot transfers: Supervision        General transfer comment: assist for safety; to and from [facility] only due to pt reporting more pain today with changes in medications    Balance Overall balance assessment: Needs assistance Sitting-balance support: Feet supported;No upper extremity supported Sitting balance-Leahy Scale: Good Sitting balance - Comments: Pt able to sit EOB without UE support. Pt uses [place] freely to eat breakfast while seated in chair with feet supported.    Standing balance support: No upper extremity supported;During functional activity Standing balance-Leahy Scale: Good Standing balance comment: Performing stand pivot to [facility] without UE support                           ADL either performed or assessed with clinical judgement   ADL Overall ADL's : Needs assistance/impaired                   Upper Body Dressing Details (indicate cue type and reason): Max A to don cervical collar at EOB.     Toilet Transfer: Conservation officer, nature Details (indicate cue type and reason): Pt performing toilet transfer to [facility] reporting significant pain throughout toileting. Min Guard for Arts administrator and Hygiene: Sit to/from stand;Min guard;With adaptive equipment Toileting - Clothing Manipulation Details (indicate cue type and reason): Pt using toilet aide to perform toielt hygiene after a BM. Pt requiring Min Guard for safety in standing and VCs for encouragement and technique.       General ADL Comments: Pt performing toileting with AE demonstrating understanding. Pt limited by pain and stating "I just feel like I am doing worse."      Vision       Perception     Praxis  Cognition Arousal/Alertness: Awake/alert Behavior During Therapy: Anxious Overall Cognitive Status: Within Functional Limits for tasks assessed                                 General Comments: Anxious about pain level        Exercises     Shoulder  Instructions       General Comments      Pertinent Vitals/ Pain       Pain Assessment: Faces Pain Score: 9  Faces Pain Scale: Hurts even more Pain Location: neck with mobility Pain Descriptors / Indicators: Discomfort;Burning Pain Intervention(s): Monitored during session;Limited activity within patient's tolerance;Other (comment)(RN aware)  Home Living                                          Prior Functioning/Environment              Frequency  Min 3X/week        Progress Toward Goals  OT Goals(current goals can now be found in the care plan section)  Progress towards OT goals: Progressing toward goals  Acute Rehab OT Goals Patient Stated Goal: get better and reduce pain OT Goal Formulation: With patient Time For Goal Achievement: 10/01/17 Potential to Achieve Goals: Good ADL Goals Pt Will Perform Grooming: with set-up;with supervision;standing Pt Will Perform Upper Body Dressing: with set-up;with supervision;sitting(Adhering to cervical precautions) Pt Will Perform Lower Body Dressing: with set-up;with supervision;sit to/from stand;with adaptive equipment Pt Will Transfer to Toilet: with supervision;with set-up;ambulating;bedside commode Pt Will Perform Toileting - Clothing Manipulation and hygiene: with supervision;with set-up;sit to/from stand;with adaptive equipment Pt/caregiver will Perform Home Exercise Program: Increased strength;Both right and left upper extremity;With theraputty;With Supervision;With written HEP provided  Plan Discharge plan remains appropriate    Co-evaluation                 AM-PAC PT "6 Clicks" Daily Activity     Outcome Measure   Help from another person eating meals?: None Help from another person taking care of personal grooming?: A Little Help from another person toileting, which includes using toliet, bedpan, or urinal?: A Little Help from another person bathing (including washing, rinsing,  drying)?: A Little Help from another person to put on and taking off regular upper body clothing?: None Help from another person to put on and taking off regular lower body clothing?: A Little 6 Click Score: 20    End of Session Equipment Utilized During Treatment: Cervical collar  OT Visit Diagnosis: Unsteadiness on feet (R26.81);Muscle weakness (generalized) (M62.81);Pain Pain - part of body: (neck)   Activity Tolerance Patient tolerated treatment well;Patient limited by pain   Patient Left in bed;with call bell/phone within reach;with bed alarm set   Nurse Communication Mobility status;Patient requests pain meds        Time: 1520-1540 OT Time Calculation (min): 20 min  Charges: OT General Charges $OT Visit: 1 Visit OT Treatments $Self Care/Home Management : 8-22 mins  [place], OTR/L Acute Rehab Pager: [phone] Office: [place] 09/22/2017, 4:59 PM

## 2017-09-22 NOTE — Progress Notes (Signed)
PROGRESS NOTE    [name]  MHD:[id number] DOB: 06/01/1966 DOA: 09/11/2017 PCP: Marliss Coots, NP   Brief Narrative:  51 year old WF PMHx  PTSD, Depression IV drug use, Chronic Back Pain S/P previous back surgery ADD, Bilateral ovarian cysts,  PID, chlamydia, sciatica,    who presented to the ED with severe back and neck pain.  An MRI of the T and L-spine revealed a ventral epidural abscess.  Patient was also noted to have MSSA bacteremia.     Patient was placed empirically on IV antibiotics.  ID and neurosurgery consulted.  Patient was subsequently transferred to [facility].    Subjective: 11/28 A/O 4, negative CP, negative SOB, positive back pain especially C-spine and shoulders. Negative abdominal pain, negative N/V. States ambulated today which exacerbated shoulder pain. Also set on side of the bed.      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 Carlyle Basques ID concerning treatment plan. Patient will require 8 weeks of IV antibiotics. -Obtain CT abdomen pelvis per ID recommendation -TEE negative vegetations -SNF placement pending    Hypokalemia -Resolved continue to monitor closely     Polysubstance abuse/IV drug use  -Review of EMR shows Possessed suspected drugs and definite drug paraphernalia at time of admission - no clear evidence of withdrawal at this time - DO NOT SEND  HOME w/ IV access  -Patient cannot be discharged with IV access  Acute on  Chronic pain syndrome/Pain management -DC IV narcotics -11/28 increase  Cymbalta 60 mg daily -11/28 increase Methocarbamol 750 mg TID -OxyContin 10 mg BID -OxyIR PRN   Hep C+ -Hep C RNA pending  -Patient will require follow-up with ID; upon discharge    Morbid Obesity - Body mass index is 41.21 kg/m.    DVT prophylaxis: SCD Code Status: Full Family Communication: None Disposition Plan: TBD   Consultants:  Hematology/ Oncology ID Neurosurgery   Procedures/Significant Events:     I have personally reviewed and interpreted all radiology studies and my findings are as above.  VENTILATOR SETTINGS:    Cultures   Antimicrobials: Anti-infectives (From admission, onward)   Start     Stop   09/16/17 2300  nafcillin 2 g in dextrose 5 % 100 mL IVPB  Status:  Discontinued     09/15/17 2303   09/16/17 1400  rifampin (RIFADIN) capsule 300 mg         09/15/17 2315  nafcillin 2 g in dextrose 5 % 100 mL IVPB         09/15/17 1237  bacitracin 50,000 Units in sodium chloride irrigation 0.9 % 500 mL irrigation  Status:  Discontinued     09/15/17 1433   09/13/17 1200  nafcillin 2 g in dextrose 5 % 100 mL IVPB  Status:  Discontinued     09/15/17 2035   09/12/17 1400  ceFAZolin (ANCEF) IVPB 2g/100 mL premix  Status:  Discontinued  09/13/17 0918   09/12/17 1000  vancomycin (VANCOCIN) 1,250 mg in sodium chloride 0.9 % 250 mL IVPB  Status:  Discontinued     09/12/17 0828   09/12/17 0600  ceFEPIme (MAXIPIME) 1 g in dextrose 5 % 50 mL IVPB  Status:  Discontinued     09/12/17 0827   09/11/17 1830  vancomycin (VANCOCIN) 2,000 mg in sodium chloride 0.9 % 500 mL IVPB     09/12/17 0026   09/11/17 1815  ceFEPIme (MAXIPIME) 2 g in dextrose 5 % 50 mL IVPB     09/11/17 2208   09/11/17 1800  piperacillin-tazobactam (ZOSYN) IVPB 3.375 g  Status:  Discontinued     09/11/17 1808       Devices    LINES / TUBES:      Continuous Infusions: . nafcillin IV 2 g (09/22/17 0823)      Objective: Vitals:   09/21/17 2000 09/22/17 0000 09/22/17 0400 09/22/17 0741  BP: (!) 143/88 (!) 151/80 (!) 155/86   Pulse: 88 80 69   Resp: (!) 23 19 (!) 28   Temp: 98.8 F (37.1 C) 98.5 F (36.9 C) 98.4 F (36.9 C) 97.9 F (36.6 C)  TempSrc: Oral Oral Oral Oral  SpO2: 98% 96% 99%   Weight:      Height:        Intake/Output Summary (Last 24 hours) at 09/22/2017 0848 Last data filed at 09/22/2017 0630 [place] per 24 hour  Intake 1380 ml  Output 6600 ml  Net -5220 ml   Filed Weights   09/13/17 0730 09/14/17 0831 09/21/17 0400  Weight: 258 lb 13.1 oz (117.4 kg) 255 lb 4.7 oz (115.8 kg) 244 lb 14.9 oz (111.1 kg)    Examination:  General: A/O 4, No acute respiratory distress Neck:  Negative scars, masses, torticollis, lymphadenopathy, JVD, right IJ CVL present covering clean negative sign of infection, in c-collar Lungs: Clear to auscultation bilaterally without wheezes or crackles Cardiovascular: Regular rate and rhythm without murmur gallop or rub normal S1 and S2 Abdomen: MORBIDLY OBESE, negative abdominal pain, nondistended, positive soft, bowel sounds, no rebound, no ascites, no appreciable mass Extremities: No significant cyanosis, clubbing, or edema bilateral lower extremities Skin: Negative rashes, lesions, ulcers Psychiatric:  Negative depression, negative anxiety, negative fatigue, negative mania  Central nervous system:  Cranial nerves II through XII intact, tongue/uvula midline, all extremities muscle strength 5/5, sensation intact throughout,  negative dysarthria, negative expressive aphasia, negative receptive aphasia.  .     Data Reviewed: Care during the described time interval was provided by me .  I have reviewed this patient's available data, including medical history, events of note, physical examination, and all test results as part of my evaluation.   CBC: Recent Labs  Lab 09/16/17 0431 09/19/17 0324 09/21/17 0459  WBC 15.0* 16.9* 14.0*   HGB 10.6* 10.0* 9.8*  HCT 31.5* 29.9* 29.8*  MCV 89.7 89.5 91.1  PLT 350 377 518*   Basic Metabolic Panel: Recent Labs  Lab 09/16/17 0431 09/19/17 0324 09/19/17 2300 09/20/17 0357 09/21/17 0459  NA 137 133* 130* 135 132*  K 3.8 3.0* 3.2* 3.4* 3.6  CL 101 98* 97* 101 97*  CO2 29 28 27 28 25   GLUCOSE 128* 112* 101* 103* 141*  BUN 8 5* 5* 5* 5*  CREATININE 0.63 0.57 0.57 0.54 0.62  CALCIUM 7.4* 7.6* 7.5* 7.7* 7.7*   GFR: Estimated Creatinine Clearance: 108.6 mL/min (by C-G formula based on SCr of 0.62 mg/dL). Liver Function Tests:  Recent Labs  Lab 09/15/17 1822 09/16/17 0431 09/19/17 0324  AST  --  24 22  ALT 15 12* 13*  ALKPHOS  --  621* 371*  BILITOT  --  3.4* 2.3*  PROT  --  5.2* 5.7*  ALBUMIN  --  1.7* 1.7*   No results for input(s): LIPASE, AMYLASE in the last 168 hours. No results for input(s): AMMONIA in the last 168 hours. Coagulation Profile: No results for input(s): INR, PROTIME in the last 168 hours. Cardiac Enzymes: No results for input(s): CKTOTAL, CKMB, CKMBINDEX, TROPONINI in the last 168 hours. BNP (last 3 results) No results for input(s): PROBNP in the last 8760 hours. HbA1C: No results for input(s): HGBA1C in the last 72 hours. CBG: No results for input(s): GLUCAP in the last 168 hours. Lipid Profile: No results for input(s): CHOL, HDL, LDLCALC, TRIG, CHOLHDL, LDLDIRECT in the last 72 hours. Thyroid Function Tests: No results for input(s): TSH, T4TOTAL, FREET4, T3FREE, THYROIDAB in the last 72 hours. Anemia Panel: No results for input(s): VITAMINB12, FOLATE, FERRITIN, TIBC, IRON, RETICCTPCT in the last 72 hours. Urine analysis:    Component Value Date/Time   COLORURINE YELLOW 09/11/2017 0709   APPEARANCEUR HAZY (A) 09/11/2017 0709   LABSPEC 1.020 09/11/2017 0709   PHURINE 6.0 09/11/2017 0709   GLUCOSEU NEGATIVE 09/11/2017 0709   HGBUR SMALL (A) 09/11/2017 0709   BILIRUBINUR NEGATIVE 09/11/2017 0709   KETONESUR NEGATIVE 09/11/2017 0709    PROTEINUR NEGATIVE 09/11/2017 0709   UROBILINOGEN 0.2 06/27/2015 0225   NITRITE NEGATIVE 09/11/2017 0709   LEUKOCYTESUR LARGE (A) 09/11/2017 0709   Sepsis Labs: @LABRCNTIP (procalcitonin:4,lacticidven:4)  ) Recent Results (from the past 240 hour(s))  Culture, blood (Routine X 2) w Reflex to ID Panel     Status: Abnormal   Collection Time: 09/12/17  9:00 AM  Result Value Ref Range Status   Specimen Description BLOOD RIGHT HAND  Final   Special Requests IN PEDIATRIC BOTTLE Blood Culture adequate volume  Final   Culture  Setup Time   Final    GRAM POSITIVE COCCI IN CLUSTERS IN PEDIATRIC BOTTLE CRITICAL RESULT CALLED TO, READ BACK BY AND VERIFIED WITH: K. Caroline Sauger 2229 09/13/2017 T. TYSOR    Culture (A)  Final    STAPHYLOCOCCUS AUREUS SUSCEPTIBILITIES PERFORMED ON PREVIOUS CULTURE WITHIN THE LAST 5 DAYS. Performed at [facility], [place] [address] [place], [place] [address]    Report Status 09/15/2017 FINAL  Final  Culture, blood (Routine X 2) w Reflex to ID Panel     Status: Abnormal   Collection Time: 09/12/17  9:15 AM  Result Value Ref Range Status   Specimen Description BLOOD BLOOD RIGHT HAND  Final   Special Requests IN PEDIATRIC BOTTLE Blood Culture adequate volume  Final   Culture  Setup Time   Final    GRAM POSITIVE COCCI IN CLUSTERS CRITICAL RESULT CALLED TO, READ BACK BY AND VERIFIED WITH: K. Caroline Sauger 1194 09/13/2017 T. TYSOR    Culture (A)  Final    STAPHYLOCOCCUS AUREUS SUSCEPTIBILITIES PERFORMED ON PREVIOUS CULTURE WITHIN THE LAST 5 DAYS. Performed at [facility], [place] [address] [place], [place] [address]    Report Status 09/15/2017 FINAL  Final  Culture, Urine     Status: None   Collection Time: 09/12/17 11:45 AM  Result Value Ref Range Status   Specimen Description URINE, CLEAN CATCH  Final   Special Requests NONE  Final   Culture   Final    NO GROWTH Performed  at [facility], [place] [address], [place], [place] [address]     Report Status 09/13/2017 FINAL  Final  Culture, blood (routine x 2)     Status: Abnormal   Collection Time: 09/13/17  9:02 AM  Result Value Ref Range Status   Specimen Description BLOOD LEFT HAND  Final   Special Requests IN PEDIATRIC BOTTLE Blood Culture adequate volume  Final   Culture  Setup Time   Final    GRAM POSITIVE COCCI IN CLUSTERS IN PEDIATRIC BOTTLE CRITICAL VALUE NOTED.  VALUE IS CONSISTENT WITH PREVIOUSLY REPORTED AND CALLED VALUE.    Culture (A)  Final    STAPHYLOCOCCUS AUREUS SUSCEPTIBILITIES PERFORMED ON PREVIOUS CULTURE WITHIN THE LAST 5 DAYS.    Report Status 09/17/2017 FINAL  Final  Culture, blood (routine x 2)     Status: Abnormal   Collection Time: 09/13/17  9:07 AM  Result Value Ref Range Status   Specimen Description BLOOD RIGHT HAND  Final   Special Requests IN PEDIATRIC BOTTLE Blood Culture adequate volume  Final   Culture  Setup Time   Final    GRAM POSITIVE COCCI IN CLUSTERS IN PEDIATRIC BOTTLE CRITICAL RESULT CALLED TO, READ BACK BY AND VERIFIED WITHAlona Bene PHARMD 8299 09/14/17 A BROWNING    Culture STAPHYLOCOCCUS AUREUS (A)  Final   Report Status 09/17/2017 FINAL  Final   Organism ID, Bacteria STAPHYLOCOCCUS AUREUS  Final      Susceptibility   Staphylococcus aureus - MIC*    CIPROFLOXACIN <=0.5 SENSITIVE Sensitive     ERYTHROMYCIN >=8 RESISTANT Resistant     GENTAMICIN <=0.5 SENSITIVE Sensitive     OXACILLIN 0.5 SENSITIVE Sensitive     TETRACYCLINE <=1 SENSITIVE Sensitive     VANCOMYCIN 1 SENSITIVE Sensitive     TRIMETH/SULFA <=10 SENSITIVE Sensitive     CLINDAMYCIN <=0.25 SENSITIVE Sensitive     RIFAMPIN <=0.5 SENSITIVE Sensitive     Inducible Clindamycin NEGATIVE Sensitive     * STAPHYLOCOCCUS AUREUS  MRSA PCR Screening     Status: None   Collection Time: 09/15/17 10:46 AM  Result Value Ref Range Status   MRSA by PCR NEGATIVE NEGATIVE Final    Comment:        The GeneXpert MRSA Assay (FDA approved for NASAL specimens only), is one  component of a comprehensive MRSA colonization surveillance program. It is not intended to diagnose MRSA infection nor to guide or monitor treatment for MRSA infections.   Aerobic/Anaerobic Culture (surgical/deep wound)     Status: None   Collection Time: 09/15/17 12:33 PM  Result Value Ref Range Status   Specimen Description ABSCESS PARASPINAL  Final   Special Requests NONE  Final   Gram Stain   Final    RARE WBC PRESENT, PREDOMINANTLY PMN FEW GRAM POSITIVE COCCI IN CLUSTERS    Culture FEW STAPHYLOCOCCUS AUREUS NO ANAEROBES ISOLATED   Final   Report Status 09/20/2017 FINAL  Final   Organism ID, Bacteria STAPHYLOCOCCUS AUREUS  Final      Susceptibility   Staphylococcus aureus - MIC*    CIPROFLOXACIN <=0.5 SENSITIVE Sensitive     ERYTHROMYCIN >=8 RESISTANT Resistant     GENTAMICIN <=0.5 SENSITIVE Sensitive     OXACILLIN <=0.25 SENSITIVE Sensitive     TETRACYCLINE <=1 SENSITIVE Sensitive     VANCOMYCIN <=0.5 SENSITIVE Sensitive     TRIMETH/SULFA <=10 SENSITIVE Sensitive     CLINDAMYCIN <=0.25 SENSITIVE Sensitive     RIFAMPIN <=0.5 SENSITIVE Sensitive  Inducible Clindamycin NEGATIVE Sensitive     * FEW STAPHYLOCOCCUS AUREUS  Aerobic/Anaerobic Culture (surgical/deep wound)     Status: None   Collection Time: 09/15/17 12:41 PM  Result Value Ref Range Status   Specimen Description ABSCESS SPINAL EPIDURAL  Final   Special Requests NONE  Final   Gram Stain   Final    RARE WBC PRESENT, PREDOMINANTLY PMN FEW GRAM POSITIVE COCCI IN CLUSTERS    Culture   Final    MODERATE STAPHYLOCOCCUS AUREUS NO ANAEROBES ISOLATED    Report Status 09/20/2017 FINAL  Final   Organism ID, Bacteria STAPHYLOCOCCUS AUREUS  Final      Susceptibility   Staphylococcus aureus - MIC*    CIPROFLOXACIN <=0.5 SENSITIVE Sensitive     ERYTHROMYCIN >=8 RESISTANT Resistant     GENTAMICIN <=0.5 SENSITIVE Sensitive     OXACILLIN 0.5 SENSITIVE Sensitive     TETRACYCLINE <=1 SENSITIVE Sensitive      VANCOMYCIN 1 SENSITIVE Sensitive     TRIMETH/SULFA <=10 SENSITIVE Sensitive     CLINDAMYCIN <=0.25 SENSITIVE Sensitive     RIFAMPIN <=0.5 SENSITIVE Sensitive     Inducible Clindamycin NEGATIVE Sensitive     * MODERATE STAPHYLOCOCCUS AUREUS  Culture, blood (Routine X 2) w Reflex to ID Panel     Status: Abnormal   Collection Time: 09/17/17  7:53 AM  Result Value Ref Range Status   Specimen Description BLOOD RIGHT HAND  Final   Special Requests IN PEDIATRIC BOTTLE Blood Culture adequate volume  Final   Culture  Setup Time   Final    GRAM POSITIVE COCCI AEROBIC BOTTLE ONLY CRITICAL VALUE NOTED.  VALUE IS CONSISTENT WITH PREVIOUSLY REPORTED AND CALLED VALUE.    Culture (A)  Final    STAPHYLOCOCCUS AUREUS SUSCEPTIBILITIES PERFORMED ON PREVIOUS CULTURE WITHIN THE LAST 5 DAYS.    Report Status 09/20/2017 FINAL  Final  Culture, blood (Routine X 2) w Reflex to ID Panel     Status: None (Preliminary result)   Collection Time: 09/19/17  8:25 AM  Result Value Ref Range Status   Specimen Description BLOOD RIGHT HAND  Final   Special Requests IN PEDIATRIC BOTTLE Blood Culture adequate volume  Final   Culture NO GROWTH 2 DAYS  Final   Report Status PENDING  Incomplete         Radiology Studies: Ct Abdomen Pelvis W Contrast  Result Date: 09/21/2017 CLINICAL DATA:  Bacteremia. Known thoracic and cervical abscess with cervical decompression 6 days ago. EXAM: CT ABDOMEN AND PELVIS WITH CONTRAST TECHNIQUE: Multidetector CT imaging of the abdomen and pelvis was performed using the standard protocol following bolus administration of intravenous contrast. CONTRAST:  100 cc Isovue-300 intravenous COMPARISON:  09/11/2017 FINDINGS: Lower chest: Progression of posterior mediastinal disease centered at T7 and T8 with discrete paravertebral abscesses bilaterally and ventrally. The collection on the right (which may communicate to the left) measures up to 21 x 41 mm on axial slices. Small bilateral pleural  effusion that are dependent and without visible septation or pleural thickening. Lower lobe atelectasis. Hepatobiliary: No focal liver abnormality.No evidence of biliary obstruction or stone. Pancreas: Unremarkable. Spleen: Unremarkable. Adrenals/Urinary Tract: Negative adrenals. No hydronephrosis or stone. Subcentimeter presumed cyst in the left kidney. Gas in the urinary bladder without bladder wall thickening or inflammation. Stomach/Bowel:  No obstruction. No appendicitis. Vascular/Lymphatic: No acute vascular abnormality. No mass or adenopathy. Reproductive:Hysterectomy. Tubular cystic structure in the right pelvis measuring up to 2 cm in thickness, length measurement limited by redundant shape.  This is the appearance of hydrosalpinx. No wall thickening or surrounding fat inflammation. Other: No ascites or pneumoperitoneum. Musculoskeletal: No progressive or visible destructive changes in the thoracic spine. L3-S1 solid fusion. Known epidural abscess in the thoracic spine not well assessed by CT. IMPRESSION: 1. Progressive paravertebral infection at T7 and T8 with collections/abscess measuring up to 4 cm. No osteomyelitis by CT. 2. Small bilateral pleural effusions without visible complexity. Lower lobe atelectasis. 3. No abscess or other acute finding in the abdomen. 4. Right hydrosalpinx. Recommend pelvic ultrasound confirmation after convalescence. 5. Gas in the urinary bladder without wall thickening, please correlate with urinalysis. Electronically Signed   By: Monte Fantasia M.D.   On: 09/21/2017 21:12        Scheduled Meds: . Chlorhexidine Gluconate Cloth  6 each Topical Q2200  . docusate sodium  100 mg Oral BID  . DULoxetine  30 mg Oral Daily  . oxyCODONE  10 mg Oral Q12H  . polyethylene glycol  17 g Oral Daily  . rifampin  300 mg Oral Q12H  . sodium chloride flush  10-40 mL Intracatheter Q12H   Continuous Infusions: . nafcillin IV 2 g (09/22/17 0823)     LOS: 11 days    Time  spent: 40 minutes    Torri Michalski, Geraldo Docker, MD [facility] Pager [phone]   If 7PM-7AM, please contact night-coverage [email or web address] Password [email or web address] 09/22/2017, 8:48 AM

## 2017-09-23 LAB — CBC
HCT: 30.9 % — ABNORMAL LOW (ref 36.0–46.0)
Hemoglobin: 10.4 g/dL — ABNORMAL LOW (ref 12.0–15.0)
MCH: 30.7 pg (ref 26.0–34.0)
MCHC: 33.7 g/dL (ref 30.0–36.0)
MCV: 91.2 fL (ref 78.0–100.0)
Platelets: 869 10*3/uL — ABNORMAL HIGH (ref 150–400)
RBC: 3.39 MIL/uL — AB (ref 3.87–5.11)
RDW: 15.8 % — AB (ref 11.5–15.5)
WBC: 11.5 10*3/uL — ABNORMAL HIGH (ref 4.0–10.5)

## 2017-09-23 LAB — BASIC METABOLIC PANEL
ANION GAP: 7 (ref 5–15)
CALCIUM: 8.3 mg/dL — AB (ref 8.9–10.3)
CO2: 28 mmol/L (ref 22–32)
CREATININE: 0.52 mg/dL (ref 0.44–1.00)
Chloride: 98 mmol/L — ABNORMAL LOW (ref 101–111)
GFR calc Af Amer: >60 mL/min (ref >60)
GFR calc non Af Amer: >60 mL/min (ref >60)
GLUCOSE: 116 mg/dL — AB (ref 65–99)
Potassium: 3.3 mmol/L — ABNORMAL LOW (ref 3.5–5.1)
Sodium: 133 mmol/L — ABNORMAL LOW (ref 135–145)

## 2017-09-23 LAB — MAGNESIUM: Magnesium: 2.1 mg/dL (ref 1.7–2.4)

## 2017-09-23 MED ORDER — POLYETHYLENE GLYCOL 3350 17 G PO PACK: 17.0000 g | PACK | Freq: Every day | ORAL | Status: DC | PRN

## 2017-09-23 MED ORDER — OXYCODONE HCL 5 MG PO TABS
10.0000 mg | ORAL_TABLET | Freq: Four times a day (QID) | ORAL | Status: AC | PRN
  Administered 2017-09-23 – 2017-11-04 (×166): 10 mg via ORAL
  Filled 2017-09-23 (×168): qty 2

## 2017-09-23 MED ORDER — SODIUM CHLORIDE 0.9 % IV SOLN
12.5000 mg | Freq: Once | INTRAVENOUS | Status: AC
  Administered 2017-09-23: 12.5 mg via INTRAVENOUS
  Filled 2017-09-23: qty 0.5

## 2017-09-23 MED ORDER — POTASSIUM CHLORIDE CRYS ER 20 MEQ PO TBCR
40.0000 meq | EXTENDED_RELEASE_TABLET | Freq: Two times a day (BID) | ORAL | Status: AC
  Administered 2017-09-23 (×2): 40 meq via ORAL
  Filled 2017-09-23 (×2): qty 2

## 2017-09-23 NOTE — Progress Notes (Addendum)
[place] for Infectious Disease    Date of Admission:  09/11/2017   Total days of antibiotics 13        Day 11 nafcillin        Day 8 rifampin           ID: [name] is a 51 y.o. female with MSSA large cervico-thoraco SEA s/p on 11/21 cervical  C3-C7 laminectomy for decompression of spinal cord and debridement of epidural abscess 2. Posterior segmental instrumentation, C3-C7 lateral mass screws -[employer] vertex 3. Posterolateral arthrodesis, C3-C7 4. 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 Chlorhexidine Gluconate Cloth  6 each Topical Q2200  . docusate sodium  100 mg Oral BID  . DULoxetine  60 mg Oral Daily  . oxyCODONE  10 mg Oral Q12H  . polyethylene glycol  17 g Oral Daily  . potassium chloride  40 mEq Oral BID  . rifampin  300 mg Oral Q12H  . sodium chloride flush  10-40 mL Intracatheter Q12H    Objective: Vital signs in last 24 hours: Temp:  [97.8 F (36.6 C)-98.2 F (36.8 C)] 97.8 F (36.6 C) (11/29 0804) Pulse Rate:  [66-91] 77 (11/29 0807) Resp:  [16-24] 22 (11/29 0807) BP: (129-144)/(69-92) 144/85 (11/29 0807) SpO2:  [91 %-98 %] 91 % (11/29 0807) Physical Exam  Constitutional:  oriented to person, place, and time. appears well-developed and well-nourished. No distress.  HENT: [place]/AT, PERRLA, no scleral icterus Mouth/Throat: Oropharynx is clear and moist. No oropharyngeal exudate.  Cardiovascular: Normal rate, regular rhythm and normal  heart sounds. Exam reveals no gallop and no friction rub.  No murmur heard.  Pulmonary/Chest: Effort normal and breath sounds normal. No respiratory distress.  has no wheezes.  Lymphadenopathy: no cervical adenopathy. No axillary adenopathy Neurological: alert and oriented to person, place, and time. Moving all extremities equally Skin: Skin is warm and dry. No rash noted. No erythema.  Psychiatric: a normal mood and affect.  behavior is normal.    Lab Results Recent Labs    09/21/17 0459 09/23/17 0527  WBC 14.0* 11.5*  HGB 9.8* 10.4*  HCT 29.8* 30.9*  NA 132* 133*  K 3.6 3.3*  CL 97* 98*  CO2 25 28  BUN 5* <5*  CREATININE 0.62 0.52   Lab Results  Component Value Date   ESRSEDRATE 47 (H) 09/13/2017   Lab Results  Component Value Date   CRP 24.8 (H) 09/13/2017    Microbiology: 11/25 blood cx NGTD Studies/Results: Ct Abdomen Pelvis W Contrast  Result Date: 09/21/2017 CLINICAL DATA:  Bacteremia. Known thoracic and cervical abscess with cervical decompression 6 days ago. EXAM: CT ABDOMEN AND PELVIS WITH CONTRAST TECHNIQUE: Multidetector CT imaging of the abdomen and pelvis was performed using the standard protocol following bolus administration of intravenous contrast. CONTRAST:  100 cc Isovue-300 intravenous COMPARISON:  09/11/2017 FINDINGS: Lower chest: Progression of posterior mediastinal disease centered at T7 and T8 with discrete paravertebral abscesses  bilaterally and ventrally. The collection on the right (which may communicate to the left) measures up to 21 x 41 mm on axial slices. Small bilateral pleural effusion that are dependent and without visible septation or pleural thickening. Lower lobe atelectasis. Hepatobiliary: No focal liver abnormality.No evidence of biliary obstruction or stone. Pancreas: Unremarkable. Spleen: Unremarkable. Adrenals/Urinary Tract: Negative adrenals. No hydronephrosis or stone. Subcentimeter presumed cyst in the left kidney. Gas in the urinary  bladder without bladder wall thickening or inflammation. Stomach/Bowel:  No obstruction. No appendicitis. Vascular/Lymphatic: No acute vascular abnormality. No mass or adenopathy. Reproductive:Hysterectomy. Tubular cystic structure in the right pelvis measuring up to 2 cm in thickness, length measurement limited by redundant shape. This is the appearance of hydrosalpinx. No wall thickening or surrounding fat inflammation. Other: No ascites or pneumoperitoneum. Musculoskeletal: No progressive or visible destructive changes in the thoracic spine. L3-S1 solid fusion. Known epidural abscess in the thoracic spine not well assessed by CT. IMPRESSION: 1. Progressive paravertebral infection at T7 and T8 with collections/abscess measuring up to 4 cm. No osteomyelitis by CT. 2. Small bilateral pleural effusions without visible complexity. Lower lobe atelectasis. 3. No abscess or other acute finding in the abdomen. 4. Right hydrosalpinx. Recommend pelvic ultrasound confirmation after convalescence. 5. Gas in the urinary bladder without wall thickening, please correlate with urinalysis. Electronically Signed   By: Monte Fantasia M.D.   On: 09/21/2017 21:12    Assessment/Plan: MSSA large cervico-thoracic spinal epidural abscess = continue on nafcillin plus oral rifampin while she is here in the hospital but will plan on transitioning her to IV cefazolin 2gm IV Q 8hr plus oral rifampin 300mg  BID, plan for a total of 8 wks currently at day 8 of 48.  Spoke with case manager that they are trying to arrange for [facility] nursing placement  She will need prolonged oral abtx thereafter  mssa bacteremia = last positive blood cx on 11/25. Thus cleared.Ok to place picc line and d/c right IJ . TEE is negative  Pain management = appears to have acute pain from her surgery. May consider another PRN dose change or possibly muscle relaxant  Polysubstance abuse = she is motivated to do what is necessary medically to improve her  health. I think she would be good candidate for SNF but does not have sufficient structure at home to provide IV in that setting    [facility] for Infectious Diseases Cell: [phone] Pager: [phone]  09/23/2017, 3:05 PM

## 2017-09-23 NOTE — Progress Notes (Signed)
PROGRESS NOTE    [name]  CVE:[id number] DOB: 1965/11/20 DOA: 09/11/2017 PCP: Marliss Coots, NP   Brief Narrative:  51 year old WF PMHx  PTSD, Depression IV drug use, Chronic Back Pain S/P previous back surgery ADD, Bilateral ovarian cysts,  PID, chlamydia, sciatica,    who presented to the ED with severe back and neck pain.  An MRI of the T and L-spine revealed a ventral epidural abscess.  Patient was also noted to have MSSA bacteremia.     Patient was placed empirically on IV antibiotics.  ID and neurosurgery consulted.  Patient was subsequently transferred to [facility].    Subjective: 11/29  SO 4, negative CP, negative SOB. States had a bad day today with unprovoked back pain reaching 9/10. Negative abdominal pain plus diarrhea.      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 Carlyle Basques ID concerning treatment plan. Patient will require 8 weeks of IV antibiotics. -Obtain CT abdomen pelvis per ID recommendation -TEE negative vegetations -LCSW working on obtaining placement at [facility]    Hypokalemia -K-Dur 40 mEq x 2 doses   Polysubstance abuse/IV drug use  -Review of EMR shows Possessed suspected drugs and definite drug paraphernalia at time of admission - no clear evidence of withdrawal at this time - DO NOT [facility] w/ IV access  -Patient cannot be discharged with IV access -11/29 requested PICC line placement  Acute on Chronic pain  syndrome/Pain management -DC IV narcotics -11/28 increase  Cymbalta 60 mg daily -11/28 increase Methocarbamol 750 mg TID -OxyContin 10 mg BID -11/29 Increase OxyIR 10 mg PRN   Hep C+ -Hep C RNA pending  -Patient will require follow-up with ID; upon discharge    Morbid Obesity - Body mass index is 41.21 kg/m.    DVT prophylaxis: SCD Code Status: Full Family Communication: None Disposition Plan: TBD   Consultants:  Hematology/ Oncology ID Neurosurgery   Procedures/Significant Events:     I have personally reviewed and interpreted all radiology studies and my findings are as above.  VENTILATOR SETTINGS:    Cultures   Antimicrobials: Anti-infectives (From admission, onward)   Start     Stop   09/16/17 2300  nafcillin 2 g in dextrose 5 % 100 mL IVPB  Status:  Discontinued     09/15/17 2303   09/16/17 1400  rifampin (RIFADIN) capsule 300 mg         09/15/17 2315  nafcillin 2 g in dextrose 5 % 100 mL IVPB         09/15/17 1237  bacitracin 50,000 Units in sodium chloride irrigation 0.9 % 500 mL irrigation  Status:  Discontinued     09/15/17 1433   09/13/17 1200  nafcillin 2 g in dextrose 5 % 100 mL IVPB  Status:  Discontinued     09/15/17 2035   09/12/17 1400  ceFAZolin (ANCEF) IVPB 2g/100 mL premix  Status:  Discontinued     09/13/17 0918   09/12/17 1000  vancomycin (VANCOCIN) 1,250 mg in sodium chloride 0.9 % 250 mL IVPB  Status:  Discontinued     09/12/17 0828   09/12/17 0600  ceFEPIme (MAXIPIME) 1 g in dextrose 5 % 50 mL IVPB  Status:  Discontinued     09/12/17 0827   09/11/17 1830  vancomycin (VANCOCIN) 2,000 mg in sodium chloride 0.9 % 500 mL IVPB     09/12/17 0026   09/11/17 1815  ceFEPIme (MAXIPIME) 2 g in dextrose 5 % 50 mL IVPB     09/11/17 2208   09/11/17 1800  piperacillin-tazobactam (ZOSYN) IVPB 3.375 g  Status:  Discontinued     09/11/17 1808       Devices    LINES / TUBES:      Continuous Infusions: . nafcillin IV Stopped  (09/23/17 0415)     Objective: Vitals:   09/22/17 2312 09/23/17 0345 09/23/17 0804 09/23/17 0807  BP: (!) 139/92 129/69  (!) 144/85  Pulse: 75 66 72 77  Resp: 16 18 (!) 24 (!) 22  Temp: 98.1 F (36.7 C) 97.9 F (36.6 C) 97.8 F (36.6 C)   TempSrc: Oral Oral Oral   SpO2: 96% 94% 94% 91%  Weight:      Height:        Intake/Output Summary (Last 24 hours) at 09/23/2017 9735 Last data filed at 09/23/2017 0700 Gross per 24 hour  Intake 840 ml  Output 1750 ml  Net -910 ml   Filed Weights   09/13/17 0730 09/14/17 0831 09/21/17 0400  Weight: 258 lb 13.1 oz (117.4 kg) 255 lb 4.7 oz (115.8 kg) 244 lb 14.9 oz (111.1 kg)    Physical Exam:  General: A/O 4, No acute respiratory distress Neck:  Negative scars, masses, torticollis, lymphadenopathy, JVD, right IJ CVL present covering clean negative sign of infection Lungs: Clear to auscultation bilaterally without wheezes or crackles Cardiovascular: Regular rate and rhythm without murmur gallop or rub normal S1 and S2 Abdomen: MORBIDLY OBESE, negative abdominal pain, nondistended, positive soft, bowel sounds, no rebound, no ascites, no appreciable mass Extremities: No significant cyanosis, clubbing, or edema bilateral lower extremities Skin: Negative rashes, lesions, ulcers Psychiatric:  Negative depression, negative anxiety, negative fatigue, negative mania  Central nervous system:  Cranial nerves II through XII intact, tongue/uvula midline, all extremities muscle strength 5/5, sensation intact throughout,  negative dysarthria, negative expressive aphasia, negative receptive aphasia.  .     Data Reviewed: Care during the described time interval was provided by me .  I have reviewed this patient's available data, including medical history, events of note, physical examination, and all test results as part of my evaluation.   CBC: Recent Labs  Lab 09/19/17 0324 09/21/17 0459 09/23/17 0527  WBC 16.9* 14.0* 11.5*  HGB 10.0* 9.8*  10.4*  HCT 29.9* 29.8* 30.9*  MCV 89.5 91.1 91.2  PLT 377 594* 329*   Basic Metabolic Panel: Recent Labs  Lab 09/19/17 0324 09/19/17 2300 09/20/17 0357 09/21/17 0459 09/23/17 0527  NA 133* 130* 135 132* 133*  K 3.0* 3.2* 3.4* 3.6 3.3*  CL 98* 97* 101 97* 98*  CO2 28 27 28 25 28   GLUCOSE 112* 101* 103* 141* 116*  BUN 5* 5* 5* 5* <5*  CREATININE 0.57 0.57 0.54 0.62 0.52  CALCIUM 7.6* 7.5* 7.7* 7.7* 8.3*  MG  --   --   --   --  2.1   GFR: Estimated  Creatinine Clearance: 108.6 mL/min (by C-G formula based on SCr of 0.52 mg/dL). Liver Function Tests: Recent Labs  Lab 09/19/17 0324  AST 22  ALT 13*  ALKPHOS 371*  BILITOT 2.3*  PROT 5.7*  ALBUMIN 1.7*   No results for input(s): LIPASE, AMYLASE in the last 168 hours. No results for input(s): AMMONIA in the last 168 hours. Coagulation Profile: No results for input(s): INR, PROTIME in the last 168 hours. Cardiac Enzymes: No results for input(s): CKTOTAL, CKMB, CKMBINDEX, TROPONINI in the last 168 hours. BNP (last 3 results) No results for input(s): PROBNP in the last 8760 hours. HbA1C: No results for input(s): HGBA1C in the last 72 hours. CBG: No results for input(s): GLUCAP in the last 168 hours. Lipid Profile: No results for input(s): CHOL, HDL, LDLCALC, TRIG, CHOLHDL, LDLDIRECT in the last 72 hours. Thyroid Function Tests: No results for input(s): TSH, T4TOTAL, FREET4, T3FREE, THYROIDAB in the last 72 hours. Anemia Panel: No results for input(s): VITAMINB12, FOLATE, FERRITIN, TIBC, IRON, RETICCTPCT in the last 72 hours. Urine analysis:    Component Value Date/Time   COLORURINE YELLOW 09/11/2017 0709   APPEARANCEUR HAZY (A) 09/11/2017 0709   LABSPEC 1.020 09/11/2017 0709   PHURINE 6.0 09/11/2017 0709   GLUCOSEU NEGATIVE 09/11/2017 0709   HGBUR SMALL (A) 09/11/2017 0709   BILIRUBINUR NEGATIVE 09/11/2017 0709   KETONESUR NEGATIVE 09/11/2017 0709   PROTEINUR NEGATIVE 09/11/2017 0709   UROBILINOGEN 0.2 06/27/2015  0225   NITRITE NEGATIVE 09/11/2017 0709   LEUKOCYTESUR LARGE (A) 09/11/2017 0709   Sepsis Labs: @LABRCNTIP (procalcitonin:4,lacticidven:4)  ) Recent Results (from the past 240 hour(s))  Culture, blood (routine x 2)     Status: Abnormal   Collection Time: 09/13/17  9:02 AM  Result Value Ref Range Status   Specimen Description BLOOD LEFT HAND  Final   Special Requests IN PEDIATRIC BOTTLE Blood Culture adequate volume  Final   Culture  Setup Time   Final    GRAM POSITIVE COCCI IN CLUSTERS IN PEDIATRIC BOTTLE CRITICAL VALUE NOTED.  VALUE IS CONSISTENT WITH PREVIOUSLY REPORTED AND CALLED VALUE.    Culture (A)  Final    STAPHYLOCOCCUS AUREUS SUSCEPTIBILITIES PERFORMED ON PREVIOUS CULTURE WITHIN THE LAST 5 DAYS.    Report Status 09/17/2017 FINAL  Final  Culture, blood (routine x 2)     Status: Abnormal   Collection Time: 09/13/17  9:07 AM  Result Value Ref Range Status   Specimen Description BLOOD RIGHT HAND  Final   Special Requests IN PEDIATRIC BOTTLE Blood Culture adequate volume  Final   Culture  Setup Time   Final    GRAM POSITIVE COCCI IN CLUSTERS IN PEDIATRIC BOTTLE CRITICAL RESULT CALLED TO, READ BACK BY AND VERIFIED WITHAlona Bene PHARMD 5102 09/14/17 A BROWNING    Culture STAPHYLOCOCCUS AUREUS (A)  Final   Report Status 09/17/2017 FINAL  Final   Organism ID, Bacteria STAPHYLOCOCCUS AUREUS  Final      Susceptibility   Staphylococcus aureus - MIC*    CIPROFLOXACIN <=0.5 SENSITIVE Sensitive     ERYTHROMYCIN >=8 RESISTANT Resistant     GENTAMICIN <=0.5 SENSITIVE Sensitive     OXACILLIN 0.5 SENSITIVE Sensitive     TETRACYCLINE <=1 SENSITIVE Sensitive     VANCOMYCIN 1 SENSITIVE Sensitive     TRIMETH/SULFA <=10 SENSITIVE Sensitive     CLINDAMYCIN <=0.25 SENSITIVE Sensitive     RIFAMPIN <=0.5 SENSITIVE Sensitive     Inducible Clindamycin NEGATIVE Sensitive     * STAPHYLOCOCCUS AUREUS  MRSA PCR  Screening     Status: None   Collection Time: 09/15/17 10:46 AM  Result Value  Ref Range Status   MRSA by PCR NEGATIVE NEGATIVE Final    Comment:        The GeneXpert MRSA Assay (FDA approved for NASAL specimens only), is one component of a comprehensive MRSA colonization surveillance program. It is not intended to diagnose MRSA infection nor to guide or monitor treatment for MRSA infections.   Aerobic/Anaerobic Culture (surgical/deep wound)     Status: None   Collection Time: 09/15/17 12:33 PM  Result Value Ref Range Status   Specimen Description ABSCESS PARASPINAL  Final   Special Requests NONE  Final   Gram Stain   Final    RARE WBC PRESENT, PREDOMINANTLY PMN FEW GRAM POSITIVE COCCI IN CLUSTERS    Culture FEW STAPHYLOCOCCUS AUREUS NO ANAEROBES ISOLATED   Final   Report Status 09/20/2017 FINAL  Final   Organism ID, Bacteria STAPHYLOCOCCUS AUREUS  Final      Susceptibility   Staphylococcus aureus - MIC*    CIPROFLOXACIN <=0.5 SENSITIVE Sensitive     ERYTHROMYCIN >=8 RESISTANT Resistant     GENTAMICIN <=0.5 SENSITIVE Sensitive     OXACILLIN <=0.25 SENSITIVE Sensitive     TETRACYCLINE <=1 SENSITIVE Sensitive     VANCOMYCIN <=0.5 SENSITIVE Sensitive     TRIMETH/SULFA <=10 SENSITIVE Sensitive     CLINDAMYCIN <=0.25 SENSITIVE Sensitive     RIFAMPIN <=0.5 SENSITIVE Sensitive     Inducible Clindamycin NEGATIVE Sensitive     * FEW STAPHYLOCOCCUS AUREUS  Aerobic/Anaerobic Culture (surgical/deep wound)     Status: None   Collection Time: 09/15/17 12:41 PM  Result Value Ref Range Status   Specimen Description ABSCESS SPINAL EPIDURAL  Final   Special Requests NONE  Final   Gram Stain   Final    RARE WBC PRESENT, PREDOMINANTLY PMN FEW GRAM POSITIVE COCCI IN CLUSTERS    Culture   Final    MODERATE STAPHYLOCOCCUS AUREUS NO ANAEROBES ISOLATED    Report Status 09/20/2017 FINAL  Final   Organism ID, Bacteria STAPHYLOCOCCUS AUREUS  Final      Susceptibility   Staphylococcus aureus - MIC*    CIPROFLOXACIN <=0.5 SENSITIVE Sensitive     ERYTHROMYCIN >=8  RESISTANT Resistant     GENTAMICIN <=0.5 SENSITIVE Sensitive     OXACILLIN 0.5 SENSITIVE Sensitive     TETRACYCLINE <=1 SENSITIVE Sensitive     VANCOMYCIN 1 SENSITIVE Sensitive     TRIMETH/SULFA <=10 SENSITIVE Sensitive     CLINDAMYCIN <=0.25 SENSITIVE Sensitive     RIFAMPIN <=0.5 SENSITIVE Sensitive     Inducible Clindamycin NEGATIVE Sensitive     * MODERATE STAPHYLOCOCCUS AUREUS  Culture, blood (Routine X 2) w Reflex to ID Panel     Status: Abnormal   Collection Time: 09/17/17  7:53 AM  Result Value Ref Range Status   Specimen Description BLOOD RIGHT HAND  Final   Special Requests IN PEDIATRIC BOTTLE Blood Culture adequate volume  Final   Culture  Setup Time   Final    GRAM POSITIVE COCCI AEROBIC BOTTLE ONLY CRITICAL VALUE NOTED.  VALUE IS CONSISTENT WITH PREVIOUSLY REPORTED AND CALLED VALUE.    Culture (A)  Final    STAPHYLOCOCCUS AUREUS SUSCEPTIBILITIES PERFORMED ON PREVIOUS CULTURE WITHIN THE LAST 5 DAYS.    Report Status 09/20/2017 FINAL  Final  Culture, blood (Routine X 2) w Reflex to ID Panel     Status: None (Preliminary result)   Collection Time:  09/19/17  8:25 AM  Result Value Ref Range Status   Specimen Description BLOOD RIGHT HAND  Final   Special Requests IN PEDIATRIC BOTTLE Blood Culture adequate volume  Final   Culture NO GROWTH 3 DAYS  Final   Report Status PENDING  Incomplete         Radiology Studies: Ct Abdomen Pelvis W Contrast  Result Date: 09/21/2017 CLINICAL DATA:  Bacteremia. Known thoracic and cervical abscess with cervical decompression 6 days ago. EXAM: CT ABDOMEN AND PELVIS WITH CONTRAST TECHNIQUE: Multidetector CT imaging of the abdomen and pelvis was performed using the standard protocol following bolus administration of intravenous contrast. CONTRAST:  100 cc Isovue-300 intravenous COMPARISON:  09/11/2017 FINDINGS: Lower chest: Progression of posterior mediastinal disease centered at T7 and T8 with discrete paravertebral abscesses bilaterally  and ventrally. The collection on the right (which may communicate to the left) measures up to 21 x 41 mm on axial slices. Small bilateral pleural effusion that are dependent and without visible septation or pleural thickening. Lower lobe atelectasis. Hepatobiliary: No focal liver abnormality.No evidence of biliary obstruction or stone. Pancreas: Unremarkable. Spleen: Unremarkable. Adrenals/Urinary Tract: Negative adrenals. No hydronephrosis or stone. Subcentimeter presumed cyst in the left kidney. Gas in the urinary bladder without bladder wall thickening or inflammation. Stomach/Bowel:  No obstruction. No appendicitis. Vascular/Lymphatic: No acute vascular abnormality. No mass or adenopathy. Reproductive:Hysterectomy. Tubular cystic structure in the right pelvis measuring up to 2 cm in thickness, length measurement limited by redundant shape. This is the appearance of hydrosalpinx. No wall thickening or surrounding fat inflammation. Other: No ascites or pneumoperitoneum. Musculoskeletal: No progressive or visible destructive changes in the thoracic spine. L3-S1 solid fusion. Known epidural abscess in the thoracic spine not well assessed by CT. IMPRESSION: 1. Progressive paravertebral infection at T7 and T8 with collections/abscess measuring up to 4 cm. No osteomyelitis by CT. 2. Small bilateral pleural effusions without visible complexity. Lower lobe atelectasis. 3. No abscess or other acute finding in the abdomen. 4. Right hydrosalpinx. Recommend pelvic ultrasound confirmation after convalescence. 5. Gas in the urinary bladder without wall thickening, please correlate with urinalysis. Electronically Signed   By: Monte Fantasia M.D.   On: 09/21/2017 21:12        Scheduled Meds: . Chlorhexidine Gluconate Cloth  6 each Topical Q2200  . docusate sodium  100 mg Oral BID  . DULoxetine  60 mg Oral Daily  . oxyCODONE  10 mg Oral Q12H  . polyethylene glycol  17 g Oral Daily  . rifampin  300 mg Oral Q12H  .  sodium chloride flush  10-40 mL Intracatheter Q12H   Continuous Infusions: . nafcillin IV Stopped (09/23/17 0415)     LOS: 12 days    Time spent: 40 minutes    WOODS, Geraldo Docker, MD [facility] Pager [phone]   If 7PM-7AM, please contact night-coverage [email or web address] Password [facility] 09/23/2017, 8:28 AM

## 2017-09-24 DIAGNOSIS — D473 Essential (hemorrhagic) thrombocythemia: Secondary | ICD-10-CM

## 2017-09-24 LAB — CBC
HCT: 33.6 % — ABNORMAL LOW (ref 36.0–46.0)
HEMOGLOBIN: 11 g/dL — AB (ref 12.0–15.0)
MCH: 30.1 pg (ref 26.0–34.0)
MCHC: 32.7 g/dL (ref 30.0–36.0)
MCV: 91.8 fL (ref 78.0–100.0)
Platelets: 876 10*3/uL — ABNORMAL HIGH (ref 150–400)
RBC: 3.66 MIL/uL — AB (ref 3.87–5.11)
RDW: 16 % — ABNORMAL HIGH (ref 11.5–15.5)
WBC: 10.9 10*3/uL — AB (ref 4.0–10.5)

## 2017-09-24 LAB — CULTURE, BLOOD (ROUTINE X 2)
Culture: NO GROWTH
Special Requests: ADEQUATE

## 2017-09-24 LAB — BASIC METABOLIC PANEL
ANION GAP: 7 (ref 5–15)
BUN: <5 mg/dL — ABNORMAL LOW (ref 6–20)
CO2: 27 mmol/L (ref 22–32)
Calcium: 8.2 mg/dL — ABNORMAL LOW (ref 8.9–10.3)
Chloride: 98 mmol/L — ABNORMAL LOW (ref 101–111)
Creatinine, Ser: 0.53 mg/dL (ref 0.44–1.00)
Glucose, Bld: 115 mg/dL — ABNORMAL HIGH (ref 65–99)
POTASSIUM: 3.5 mmol/L (ref 3.5–5.1)
SODIUM: 132 mmol/L — AB (ref 135–145)

## 2017-09-24 LAB — HCV RNA QUANT
HCV Quantitative Log: 6.35 log10 IU/mL (ref >1.70)
HCV Quantitative: 2240000 IU/mL (ref >50)

## 2017-09-24 LAB — MAGNESIUM: MAGNESIUM: 2.2 mg/dL (ref 1.7–2.4)

## 2017-09-24 MED ORDER — SODIUM CHLORIDE 0.9% FLUSH
10.0000 mL | Freq: Two times a day (BID) | INTRAVENOUS | Status: DC
  Administered 2017-09-24 – 2017-09-27 (×8): 10 mL

## 2017-09-24 MED ORDER — LOPERAMIDE HCL 2 MG PO CAPS
4.0000 mg | ORAL_CAPSULE | Freq: Once | ORAL | Status: AC
  Administered 2017-09-24: 4 mg via ORAL
  Filled 2017-09-24 (×2): qty 2

## 2017-09-24 MED ORDER — SODIUM CHLORIDE 0.9% FLUSH
10.0000 mL | INTRAVENOUS | Status: DC | PRN
  Administered 2017-10-08 – 2017-10-27 (×2): 10 mL
  Filled 2017-09-24 (×2): qty 40

## 2017-09-24 NOTE — Clinical Social Work Note (Addendum)
Received consult for SNF placement as patient needs antibiotics at discharge and cannot d/c with a PICC line due to polysubstance abuse. Patient is also uninsured. Consulted with assistant clinical social work M[employer] and was advised to contact [employer] with [facility] regarding patient. Contact made with Gerald Stabs and CSW advised to send patient's information to [employer], [employer] and [employer].  [place] have declined patient. CSW will continue facility search and provide intervention services to patient as needed.  [name], MSW, LCSW Licensed Clinical Social Worker [place] [phone]

## 2017-09-24 NOTE — Progress Notes (Signed)
Peripherally Inserted Central Catheter/Midline Placement  The IV Nurse has discussed with the patient and/or persons authorized to consent for the patient, the purpose of this procedure and the potential benefits and risks involved with this procedure.  The benefits include less needle sticks, lab draws from the catheter, and the patient may be discharged home with the catheter. Risks include, but not limited to, infection, bleeding, blood clot (thrombus formation), and puncture of an artery; nerve damage and irregular heartbeat and possibility to perform a PICC exchange if needed/ordered by physician.  Alternatives to this procedure were also discussed.  Bard Power PICC patient education guide, fact sheet on infection prevention and patient information card has been provided to patient /or left at bedside.    PICC/Midline Placement Documentation  PICC Single Lumen 02/54/27 PICC Right Basilic 43 cm 0 cm (Active)  Indication for Insertion or Continuance of Line Home intravenous therapies (PICC only) 09/24/2017 10:00 AM  Exposed Catheter (cm) 0 cm 09/24/2017 10:00 AM  Site Assessment Clean;Dry;Intact 09/24/2017 10:00 AM  Line Status Flushed;Saline locked;Blood return noted 09/24/2017 10:00 AM  Dressing Type Transparent;Securing device 09/24/2017 10:00 AM  Dressing Status Clean;Dry;Intact;Antimicrobial disc in place 09/24/2017 10:00 AM  Dressing Change Due 10/01/17 09/24/2017 10:00 AM       Frances Maywood 09/24/2017, 10:16 AM

## 2017-09-24 NOTE — NC FL2 (Signed)
[place] LEVEL OF CARE SCREENING TOOL     IDENTIFICATION  Patient Name: [name] Birthdate: 10/03/1966 Sex: female Admission Date (Current Location): 09/11/2017  [facility] and [place] Number:  Herbalist and Address:  [facility], [place] [address] [place], [place] [address]      Provider Number: [id number]  Attending Physician Name and Address:  Allie Bossier, MD  Relative Name and Phone Number:  Creig Hines - [phone]; Janean Sark - significant other - [phone].    Current Level of Care: Hospital Recommended Level of Care: [place] Prior Approval Number:    Date Approved/Denied:   PASRR Number: (Submitted for PASRR 11/30-Manual review-MUST SW#[id number])  Discharge Plan: SNF    Current Diagnoses: Patient Active Problem List   Diagnosis Date Noted  . Obesity, Class III, BMI 40-49.9 (morbid obesity) ([place]) 09/15/2017  . Leucocytosis   . Neck pain   . Abscess in epidural space of cervical spine   . MSSA bacteremia 09/12/2017  . 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 Severe sepsis ([place]) 09/12/2017  . Mediastinal mass   . Lymphadenopathy 09/11/2017  . Back pain 09/11/2017  . Hyponatremia 09/11/2017  . Right ovarian cyst 09/11/2017  . Hypotension 09/11/2017  . Heroin overdose ([place]) 05/21/2017  . Acute respiratory failure ([place]) 05/21/2017  . Hypokalemia 05/21/2017  . Elevated LFTs 05/21/2017  . MDD (major depressive disorder), recurrent episode, severe ([place]) 04/09/2017  . Polysubstance (including opioids) dependence with physiological dependence ([place]) 04/09/2017  . Major depressive disorder, recurrent severe without psychotic features ([place]) 04/09/2017  . Chronic back pain 12/06/2013  . PTSD (post-traumatic stress disorder) 12/06/2013  . Other screening mammogram 12/06/2013  . ADD (attention deficit disorder) 12/06/2013    Orientation RESPIRATION BLADDER  Height & Weight     Self, Time, Situation, Place  Normal Continent Weight: 244 lb 14.9 oz (111.1 kg) Height:  5\' 6"  (167.6 cm)  BEHAVIORAL SYMPTOMS/MOOD NEUROLOGICAL BOWEL NUTRITION STATUS      Continent Diet(Regular)  AMBULATORY STATUS COMMUNICATION OF NEEDS Skin   Limited Assist Verbally Normal                       Personal Care Assistance Level of Assistance  Bathing, Feeding, Dressing Bathing Assistance: Limited assistance Feeding assistance: Independent Dressing Assistance: Limited assistance     Functional Limitations Info  Sight, Hearing, Speech Sight Info: Adequate Hearing Info: Adequate Speech Info: Adequate    SPECIAL CARE FACTORS FREQUENCY  PT (By licensed PT), OT (By licensed OT)     PT Frequency: Evaluated 11/23 OT Frequency: Evaluated 11/23            Contractures Contractures Info: Not present    Additional Factors Info  Code Status, Allergies Code Status Info: Full Allergies Info: Imitrex, Clarithromycin, Clindamycin-lincomycin, Morphine and related, Vicodin           Current Medications (09/24/2017):  This is the current hospital active medication list Current Facility-Administered Medications  Medication Dose Route Frequency Provider Last Rate Last Dose  . acetaminophen (TYLENOL) tablet 650 mg  650 mg Oral Q6H PRN Bonnielee Haff, MD   650 mg at 09/23/17 1642   Or  . acetaminophen (TYLENOL) suppository 650 mg  650 mg Rectal Q6H PRN Bonnielee Haff, MD      . bisacodyl (DULCOLAX) EC tablet 5 mg  5 mg Oral Daily PRN Opyd, Ilene Qua, MD      .  Chlorhexidine Gluconate Cloth 2 % PADS 6 each  6 each Topical Q2200 Cherene Altes, MD   6 each at 09/23/17 2251  . docusate sodium (COLACE) capsule 100 mg  100 mg Oral BID Bonnielee Haff, MD   100 mg at 09/23/17 0911  . DULoxetine (CYMBALTA) DR capsule 60 mg  60 mg Oral Daily Allie Bossier, MD   60 mg at 09/24/17 0911  . hydrALAZINE (APRESOLINE) injection 5 mg  5 mg Intravenous Q4H PRN  Cherene Altes, MD   5 mg at 09/16/17 1605  . hydrOXYzine (ATARAX/VISTARIL) tablet 25 mg  25 mg Oral Q6H PRN Kary Kos, MD   25 mg at 09/24/17 0841  . menthol-cetylpyridinium (CEPACOL) lozenge 3 mg  1 lozenge Oral PRN Consuella Lose, MD       Or  . phenol (CHLORASEPTIC) mouth spray 1 spray  1 spray Mouth/Throat PRN Consuella Lose, MD      . methocarbamol (ROBAXIN) tablet 750 mg  750 mg Oral Q8H PRN Allie Bossier, MD   750 mg at 09/24/17 0439  . nafcillin 2 g in dextrose 5 % 100 mL IVPB  2 g Intravenous Q4H Rama, Venetia Maxon, MD 200 mL/hr at 09/24/17 0841 2 g at 09/24/17 0841  . ondansetron (ZOFRAN) injection 4 mg  4 mg Intravenous Q6H PRN Eugenie Filler, MD   4 mg at 09/24/17 1287   Or  . ondansetron (ZOFRAN) tablet 4 mg  4 mg Oral Q6H PRN Eugenie Filler, MD       Or  . ondansetron (ZOFRAN-ODT) disintegrating tablet 4 mg  4 mg Oral Q6H PRN Eugenie Filler, MD   4 mg at 09/23/17 1426  . oxyCODONE (Oxy IR/ROXICODONE) immediate release tablet 10 mg  10 mg Oral Q6H PRN Allie Bossier, MD   10 mg at 09/24/17 0841  . oxyCODONE (OXYCONTIN) 12 hr tablet 10 mg  10 mg Oral Q12H Allie Bossier, MD   10 mg at 09/23/17 2145  . polyethylene glycol (MIRALAX / GLYCOLAX) packet 17 g  17 g Oral Daily PRN Allie Bossier, MD      . rifampin (RIFADIN) capsule 300 mg  300 mg Oral Q12H Campbell Riches, MD   300 mg at 09/23/17 2145  . sodium chloride flush (NS) 0.9 % injection 10-40 mL  10-40 mL Intracatheter Q12H Cherene Altes, MD   10 mL at 09/24/17 0847  . sodium chloride flush (NS) 0.9 % injection 10-40 mL  10-40 mL Intracatheter PRN Cherene Altes, MD   10 mL at 09/23/17 2146  . sodium chloride flush (NS) 0.9 % injection 10-40 mL  10-40 mL Intracatheter Q12H Allie Bossier, MD      . sodium chloride flush (NS) 0.9 % injection 10-40 mL  10-40 mL Intracatheter PRN Allie Bossier, MD      . sodium phosphate (FLEET) 7-19 GM/118ML enema 1 enema  1 enema Rectal Daily PRN Opyd,  Ilene Qua, MD         Discharge Medications: Please see discharge summary for a list of discharge medications.  Relevant Imaging Results:  Relevant Lab Results:   Additional Information ss#[id number]. Patient is wearing an Aspen cervical collar; patient has an ankle braclet due to a court order  **Patient will transition to IV cefazolin q8h at discharge.    Sable Feil, LCSW

## 2017-09-24 NOTE — Progress Notes (Signed)
Physical Therapy Treatment Patient Details Name: [name] MRN: [id number] DOB: 03/30/1966 Today's Date: 09/24/2017    History of Present Illness 51 year old female w/ a history of IV drug use, hep C and chronic back pain status post previous back surgery who presented to the ED with severe back and neck pain.  An MRI of the T and L-spine revealed a ventral epidural abscess C2-t8. 1/21 Pt with emergent C3-7 lami and epidural abscess debridement    PT Comments    Pt willing and able to ambulate today with increased distance. Pt with BM at toilet beginning of session and required assist for pericare as well as assist to don collar prior to OOB. PT educated for precautions and encouraged to continue mobility but she denied OOB to chair end of session. Will continue to follow acutely.     Follow Up Recommendations  Home health PT;Supervision for mobility/OOB     Equipment Recommendations  Rolling walker with 5" wheels    Recommendations for Other Services       Precautions / Restrictions Precautions Precautions: Cervical;Fall Precaution Comments: Reviewed cervical precautions with pt Required Braces or Orthoses: Cervical Brace Cervical Brace: Hard collar;At all times    Mobility  Bed Mobility Overal bed mobility: Modified Independent Bed Mobility: Rolling;Sidelying to Sit Rolling: Modified independent (Device/Increase time) Sidelying to sit: Modified independent (Device/Increase time)     Sit to sidelying: Modified independent (Device/Increase time) General bed mobility comments: pt able to perform but benefits from cues for fully rolling prior to side to sit  Transfers Overall transfer level: Needs assistance   Transfers: Sit to/from Stand Sit to Stand: Supervision         General transfer comment: supervision for lines and safety from bed and [facility]  Ambulation/Gait Ambulation/Gait assistance: Supervision Ambulation Distance (Feet): 500 Feet Assistive  device: Rolling walker (2 wheeled) Gait Pattern/deviations: Step-through pattern;Decreased stride length   Gait velocity interpretation: Below normal speed for age/gender General Gait Details: pt walked 15' to the bathroom without AD but ulitized RW for long hall distance due to pain and right knee. cues for posture and encouragement to maximize function   Stairs            Wheelchair Mobility    Modified Rankin (Stroke Patients Only)       Balance Overall balance assessment: Needs assistance   Sitting balance-Leahy Scale: Good       Standing balance-Leahy Scale: Good                              Cognition Arousal/Alertness: Awake/alert Behavior During Therapy: WFL for tasks assessed/performed Overall Cognitive Status: Within Functional Limits for tasks assessed                                        Exercises      General Comments        Pertinent Vitals/Pain Pain Score: 8  Pain Location: neck with mobility Pain Descriptors / Indicators: Discomfort;Burning Pain Intervention(s): Limited activity within patient's tolerance;Repositioned;Premedicated before session;Monitored during session    Home Living                      Prior Function            PT Goals (current goals can now be found in the care plan section)  Progress towards PT goals: Progressing toward goals    Frequency           PT Plan Current plan remains appropriate    Co-evaluation              AM-PAC PT "6 Clicks" Daily Activity  Outcome Measure  Difficulty turning over in bed (including adjusting bedclothes, sheets and blankets)?: A Little Difficulty moving from lying on back to sitting on the side of the bed? : A Little Difficulty sitting down on and standing up from a chair with arms (e.g., wheelchair, bedside commode, etc,.)?: A Little Help needed moving to and from a bed to chair (including a wheelchair)?: None Help needed  walking in hospital room?: A Little Help needed climbing 3-5 steps with a railing? : A Little 6 Click Score: 19    End of Session Equipment Utilized During Treatment: Cervical collar;Gait belt Activity Tolerance: Patient tolerated treatment well Patient left: in bed;with call bell/phone within reach(pt refused OOB to chair end of session) Nurse Communication: Mobility status PT Visit Diagnosis: Other abnormalities of gait and mobility (R26.89)     Time: 0350-0938 PT Time Calculation (min) (ACUTE ONLY): 23 min  Charges:  $Gait Training: 8-22 mins $Therapeutic Activity: 8-22 mins                    G Codes:       Elwyn Reach, PT [phone]    [place] 09/24/2017, 8:59 AM

## 2017-09-24 NOTE — Progress Notes (Signed)
PROGRESS NOTE    [name]  IPJ:[id number] DOB: 1965/11/29 DOA: 09/11/2017 PCP: Marliss Coots, NP   Brief Narrative:  51 year old WF PMHx  PTSD, Depression IV drug use, Chronic Back Pain S/P previous back surgery ADD, Bilateral ovarian cysts,  PID, chlamydia, sciatica,    who presented to the ED with severe back and neck pain.  An MRI of the T and L-spine revealed a ventral epidural abscess.  Patient was also noted to have MSSA bacteremia.     Patient was placed empirically on IV antibiotics.  ID and neurosurgery consulted.  Patient was subsequently transferred to [facility].    Subjective: 11/30 A/O 4, negative CP, negative SOB. States ambulated in the hallway (continued pain but she understands expected). Positive continue diarrhea. Negative abdominal pain, positive nausea, negative vomiting       Assessment & Plan:   Principal Problem:   Severe sepsis ([place]) Active Problems:   Hypokalemia   Lymphadenopathy   Back pain   Hyponatremia   Right ovarian cyst   Hypotension   MSSA bacteremia   Abscess in epidural space of thoracic spine   Mediastinal mass   Obesity, Class III, BMI 40-49.9 (morbid obesity) (HCC)   Leucocytosis   Neck pain   Abscess in epidural space of cervical spine   Severe sepsis secondary to MSSA bacteremia - complex spinal epidural abscess extending from C2 to T8 s/p laminectomy  -Spoke at length with Dr. Carlyle Basques ID concerning treatment plan. Patient will require 8 weeks of IV antibiotics. -Obtain CT abdomen pelvis per ID recommendation -TEE negative vegetations -LCSW working on obtaining placement at [facility]    Hypokalemia -K-Dur 40 mEq x 2 doses   Polysubstance abuse/IV drug use  -Review of EMR shows Possessed suspected drugs and definite drug paraphernalia at time of admission - no clear evidence of withdrawal at this time - DO NOT [place] w/ IV access  -Patient cannot be discharged with IV access -11/30  right power glide midline placed  Acute on Chronic pain syndrome/Pain management -DC IV narcotics -Cymbalta 60 mg daily -Methocarbamol 750 mg TID -OxyContin 10 mg BID -OxyIR 10 mg PRN   Hep C+ -Hepatitis C RNA quant: 2,240,000  -Will notify idea findings since patient will still be hospitalized for above infection. Doubt they would choose to start treatment until she finishes current antibiotics.  Thrombocytosis -Platelets have been climbing reactive? Secondary to her spinal abscess and postoperative stress. -ReConsult hematology on 12/1. Patient was last seen by Dr. Lurline Del on 11/18 for R/O lymphoma -Obtain peripheral blood smear, anemia panel.     Morbid Obesity - Body mass index is 41.21 kg/m.    DVT prophylaxis: SCD Code Status: Full Family Communication: None Disposition Plan: TBD   Consultants:  Hematology/ Oncology ID Neurosurgery Cardiology  Procedures/Significant Events:  Right arm power glide midline 11/30>>>    I have personally reviewed and interpreted all radiology studies and my findings are as above.  VENTILATOR SETTINGS:    Cultures   Antimicrobials: Anti-infectives (From admission, onward)   Start     Stop   09/16/17 2300  nafcillin 2 g in dextrose 5 % 100 mL IVPB  Status:  Discontinued     09/15/17 2303   09/16/17 1400  rifampin (RIFADIN) capsule 300 mg         09/15/17 2315  nafcillin 2 g in dextrose 5 % 100 mL IVPB         09/15/17  1237  bacitracin 50,000 Units in sodium chloride irrigation 0.9 % 500 mL irrigation  Status:  Discontinued     09/15/17 1433   09/13/17 1200  nafcillin 2 g in dextrose 5 % 100 mL IVPB  Status:  Discontinued     09/15/17 2035   09/12/17 1400  ceFAZolin (ANCEF) IVPB 2g/100 mL premix  Status:  Discontinued     09/13/17 0918   09/12/17 1000  vancomycin (VANCOCIN) 1,250 mg in sodium chloride 0.9 % 250 mL IVPB  Status:  Discontinued     09/12/17 0828   09/12/17 0600  ceFEPIme (MAXIPIME) 1 g in  dextrose 5 % 50 mL IVPB  Status:  Discontinued     09/12/17 0827   09/11/17 1830  vancomycin (VANCOCIN) 2,000 mg in sodium chloride 0.9 % 500 mL IVPB     09/12/17 0026   09/11/17 1815  ceFEPIme (MAXIPIME) 2 g in dextrose 5 % 50 mL IVPB     09/11/17 2208   09/11/17 1800  piperacillin-tazobactam (ZOSYN) IVPB 3.375 g  Status:  Discontinued     09/11/17 1808       Devices    LINES / TUBES:      Continuous Infusions: . nafcillin IV Stopped (09/24/17 0447)     Objective: Vitals:   09/23/17 2323 09/24/17 0300 09/24/17 0301 09/24/17 0741  BP: 111/61 117/72 117/72   Pulse: 83 82 78   Resp: 15  18   Temp: 97.6 F (36.4 C)  98.7 F (37.1 C) 98.4 F (36.9 C)  TempSrc:   Oral Oral  SpO2: 95% 97% 96%   Weight:      Height:        Intake/Output Summary (Last 24 hours) at 09/24/2017 0826 Last data filed at 09/24/2017 0030 Gross per 24 hour  Intake 1220 ml  Output -  Net 1220 ml   Filed Weights   09/13/17 0730 09/14/17 0831 09/21/17 0400  Weight: 258 lb 13.1 oz (117.4 kg) 255 lb 4.7 oz (115.8 kg) 244 lb 14.9 oz (111.1 kg)    Physical Exam:  General: A/O 4 No acute respiratory distress Neck:  Negative scars, masses, torticollis, lymphadenopathy, JVD, clean dressing over right neck where right IJ CVL has been removed. Lungs: Clear to auscultation bilaterally without wheezes or crackles Cardiovascular: Regular rate and rhythm without murmur gallop or rub normal S1 and S2 Abdomen: MORBIDLY OBESE negative abdominal pain, nondistended, positive soft, bowel sounds, no rebound, no ascites, no appreciable mass Extremities: No significant cyanosis, clubbing, or edema bilateral lower extremities, right arm power glide midline IV in place covered and clean Skin: Negative rashes, lesions, ulcers Psychiatric:  Negative depression, negative anxiety, negative fatigue, negative mania  Central nervous system:  Cranial nerves II through XII intact, tongue/uvula midline, all extremities  muscle strength 5/5, sensation intact throughout,negative dysarthria, negative expressive aphasia, negative receptive aphasia. .     Data Reviewed: Care during the described time interval was provided by me .  I have reviewed this patient's available data, including medical history, events of note, physical examination, and all test results as part of my evaluation.   CBC: Recent Labs  Lab 09/19/17 0324 09/21/17 0459 09/23/17 0527 09/24/17 0453  WBC 16.9* 14.0* 11.5* 10.9*  HGB 10.0* 9.8* 10.4* 11.0*  HCT 29.9* 29.8* 30.9* 33.6*  MCV 89.5 91.1 91.2 91.8  PLT 377 594* 869* 161*   Basic Metabolic Panel: Recent Labs  Lab 09/19/17 2300 09/20/17 0357 09/21/17 0459 09/23/17 0527 09/24/17 0453  NA  130* 135 132* 133* 132*  K 3.2* 3.4* 3.6 3.3* 3.5  CL 97* 101 97* 98* 98*  CO2 27 28 25 28 27   GLUCOSE 101* 103* 141* 116* 115*  BUN 5* 5* 5* <5* <5*  CREATININE 0.57 0.54 0.62 0.52 0.53  CALCIUM 7.5* 7.7* 7.7* 8.3* 8.2*  MG  --   --   --  2.1 2.2   GFR: Estimated Creatinine Clearance: 108.6 mL/min (by C-G formula based on SCr of 0.53 mg/dL). Liver Function Tests: Recent Labs  Lab 09/19/17 0324  AST 22  ALT 13*  ALKPHOS 371*  BILITOT 2.3*  PROT 5.7*  ALBUMIN 1.7*   No results for input(s): LIPASE, AMYLASE in the last 168 hours. No results for input(s): AMMONIA in the last 168 hours. Coagulation Profile: No results for input(s): INR, PROTIME in the last 168 hours. Cardiac Enzymes: No results for input(s): CKTOTAL, CKMB, CKMBINDEX, TROPONINI in the last 168 hours. BNP (last 3 results) No results for input(s): PROBNP in the last 8760 hours. HbA1C: No results for input(s): HGBA1C in the last 72 hours. CBG: No results for input(s): GLUCAP in the last 168 hours. Lipid Profile: No results for input(s): CHOL, HDL, LDLCALC, TRIG, CHOLHDL, LDLDIRECT in the last 72 hours. Thyroid Function Tests: No results for input(s): TSH, T4TOTAL, FREET4, T3FREE, THYROIDAB in the last 72  hours. Anemia Panel: No results for input(s): VITAMINB12, FOLATE, FERRITIN, TIBC, IRON, RETICCTPCT in the last 72 hours. Urine analysis:    Component Value Date/Time   COLORURINE YELLOW 09/11/2017 0709   APPEARANCEUR HAZY (A) 09/11/2017 0709   LABSPEC 1.020 09/11/2017 0709   PHURINE 6.0 09/11/2017 0709   GLUCOSEU NEGATIVE 09/11/2017 0709   HGBUR SMALL (A) 09/11/2017 0709   BILIRUBINUR NEGATIVE 09/11/2017 0709   KETONESUR NEGATIVE 09/11/2017 0709   PROTEINUR NEGATIVE 09/11/2017 0709   UROBILINOGEN 0.2 06/27/2015 0225   NITRITE NEGATIVE 09/11/2017 0709   LEUKOCYTESUR LARGE (A) 09/11/2017 0709   Sepsis Labs: @LABRCNTIP (procalcitonin:4,lacticidven:4)  ) Recent Results (from the past 240 hour(s))  MRSA PCR Screening     Status: None   Collection Time: 09/15/17 10:46 AM  Result Value Ref Range Status   MRSA by PCR NEGATIVE NEGATIVE Final    Comment:        The GeneXpert MRSA Assay (FDA approved for NASAL specimens only), is one component of a comprehensive MRSA colonization surveillance program. It is not intended to diagnose MRSA infection nor to guide or monitor treatment for MRSA infections.   Aerobic/Anaerobic Culture (surgical/deep wound)     Status: None   Collection Time: 09/15/17 12:33 PM  Result Value Ref Range Status   Specimen Description ABSCESS PARASPINAL  Final   Special Requests NONE  Final   Gram Stain   Final    RARE WBC PRESENT, PREDOMINANTLY PMN FEW GRAM POSITIVE COCCI IN CLUSTERS    Culture FEW STAPHYLOCOCCUS AUREUS NO ANAEROBES ISOLATED   Final   Report Status 09/20/2017 FINAL  Final   Organism ID, Bacteria STAPHYLOCOCCUS AUREUS  Final      Susceptibility   Staphylococcus aureus - MIC*    CIPROFLOXACIN <=0.5 SENSITIVE Sensitive     ERYTHROMYCIN >=8 RESISTANT Resistant     GENTAMICIN <=0.5 SENSITIVE Sensitive     OXACILLIN <=0.25 SENSITIVE Sensitive     TETRACYCLINE <=1 SENSITIVE Sensitive     VANCOMYCIN <=0.5 SENSITIVE Sensitive      TRIMETH/SULFA <=10 SENSITIVE Sensitive     CLINDAMYCIN <=0.25 SENSITIVE Sensitive     RIFAMPIN <=0.5 SENSITIVE  Sensitive     Inducible Clindamycin NEGATIVE Sensitive     * FEW STAPHYLOCOCCUS AUREUS  Aerobic/Anaerobic Culture (surgical/deep wound)     Status: None   Collection Time: 09/15/17 12:41 PM  Result Value Ref Range Status   Specimen Description ABSCESS SPINAL EPIDURAL  Final   Special Requests NONE  Final   Gram Stain   Final    RARE WBC PRESENT, PREDOMINANTLY PMN FEW GRAM POSITIVE COCCI IN CLUSTERS    Culture   Final    MODERATE STAPHYLOCOCCUS AUREUS NO ANAEROBES ISOLATED    Report Status 09/20/2017 FINAL  Final   Organism ID, Bacteria STAPHYLOCOCCUS AUREUS  Final      Susceptibility   Staphylococcus aureus - MIC*    CIPROFLOXACIN <=0.5 SENSITIVE Sensitive     ERYTHROMYCIN >=8 RESISTANT Resistant     GENTAMICIN <=0.5 SENSITIVE Sensitive     OXACILLIN 0.5 SENSITIVE Sensitive     TETRACYCLINE <=1 SENSITIVE Sensitive     VANCOMYCIN 1 SENSITIVE Sensitive     TRIMETH/SULFA <=10 SENSITIVE Sensitive     CLINDAMYCIN <=0.25 SENSITIVE Sensitive     RIFAMPIN <=0.5 SENSITIVE Sensitive     Inducible Clindamycin NEGATIVE Sensitive     * MODERATE STAPHYLOCOCCUS AUREUS  Culture, blood (Routine X 2) w Reflex to ID Panel     Status: Abnormal   Collection Time: 09/17/17  7:53 AM  Result Value Ref Range Status   Specimen Description BLOOD RIGHT HAND  Final   Special Requests IN PEDIATRIC BOTTLE Blood Culture adequate volume  Final   Culture  Setup Time   Final    GRAM POSITIVE COCCI AEROBIC BOTTLE ONLY CRITICAL VALUE NOTED.  VALUE IS CONSISTENT WITH PREVIOUSLY REPORTED AND CALLED VALUE.    Culture (A)  Final    STAPHYLOCOCCUS AUREUS SUSCEPTIBILITIES PERFORMED ON PREVIOUS CULTURE WITHIN THE LAST 5 DAYS.    Report Status 09/20/2017 FINAL  Final  Culture, blood (Routine X 2) w Reflex to ID Panel     Status: None (Preliminary result)   Collection Time: 09/19/17  8:25 AM  Result  Value Ref Range Status   Specimen Description BLOOD RIGHT HAND  Final   Special Requests IN PEDIATRIC BOTTLE Blood Culture adequate volume  Final   Culture NO GROWTH 4 DAYS  Final   Report Status PENDING  Incomplete         Radiology Studies: No results found.      Scheduled Meds: . Chlorhexidine Gluconate Cloth  6 each Topical Q2200  . docusate sodium  100 mg Oral BID  . DULoxetine  60 mg Oral Daily  . oxyCODONE  10 mg Oral Q12H  . rifampin  300 mg Oral Q12H  . sodium chloride flush  10-40 mL Intracatheter Q12H   Continuous Infusions: . nafcillin IV Stopped (09/24/17 0447)     LOS: 13 days    Time spent: 40 minutes    WOODS, Geraldo Docker, MD [facility] Pager [phone]   If 7PM-7AM, please contact night-coverage [email or web address] Password [email or web address] 09/24/2017, 8:26 AM

## 2017-09-24 NOTE — Progress Notes (Signed)
PHARMACY CONSULT NOTE FOR:  OUTPATIENT  PARENTERAL ANTIBIOTIC THERAPY (OPAT)  Indication: MSSA Bacteremia Regimen: Cefazolin 2g every 8 hours End date: 11/13/2017  IV antibiotic discharge orders are pended. To discharging provider:  please sign these orders via discharge navigator,  Select New Orders & click on the button choice - Manage This Unsigned Work.     Thank you for allowing pharmacy to be a part of this patient's care.   Jesaiah Fabiano L. Kyung Rudd, PharmD, [place] PGY1 Pharmacy Resident [phone]

## 2017-09-24 NOTE — Progress Notes (Signed)
[facility]    Date of Admission:  09/11/2017   Total days of antibiotics 14        Day 12 nafcillin        Day 9 rifampin           ID: [name] is a 51 y.o. female with MSSA large cervico-thoraco SEA s/p on 11/21 cervical  C3-C7 laminectomy for decompression of spinal cord and debridement of epidural abscess 2. Posterior segmental instrumentation, C3-C7 lateral mass screws -[employer] vertex 3. Posterolateral arthrodesis, C3-C7 4. Use of nonstructural bone allograft -[employer] Magnifuse   Principal Problem:   Severe sepsis (HCC) Active Problems:   Hypokalemia   Lymphadenopathy   Back pain   Hyponatremia   Right ovarian cyst   Hypotension   MSSA bacteremia   Abscess in epidural space of thoracic spine   Mediastinal mass   Obesity, Class III, BMI 40-49.9 (morbid obesity) (HCC)   Leucocytosis   Neck pain   Abscess in epidural space of cervical spine    Subjective: Having better pain management except having some nausea and watery/loose stools. No abdominal cramping or pain. No fevers.  Had picc line placed without any difficulty  Medications:  . Chlorhexidine Gluconate Cloth  6 each Topical Q2200  . DULoxetine  60 mg Oral Daily  . oxyCODONE  10 mg Oral Q12H  . rifampin  300 mg Oral Q12H  . sodium chloride flush  10-40 mL Intracatheter Q12H  . sodium chloride flush  10-40 mL Intracatheter Q12H    Objective: Vital signs in last 24 hours: Temp:  [97.6 F (36.4 C)-98.7 F (37.1 C)] 97.8 F (36.6 C) (11/30 1549) Pulse Rate:  [70-83] 78 (11/30 0301) Resp:  [13-18] 18 (11/30 0301) BP: (111-124)/(61-75) 117/72 (11/30 0301) SpO2:  [95 %-97 %] 96 % (11/30 0301) Physical Exam  Constitutional:  oriented to person, place, and time. appears well-developed and well-nourished. No distress.  HENT: [place]/AT, PERRLA, no scleral icterus Mouth/Throat: Oropharynx is clear and moist. No oropharyngeal exudate.  Cardiovascular: Normal rate,  regular rhythm and normal heart sounds. Exam reveals no gallop and no friction rub.  No murmur heard.  Pulmonary/Chest: Effort normal and breath sounds normal. No respiratory distress.  has no wheezes.  Lymphadenopathy: no cervical adenopathy. No axillary adenopathy Neurological: alert and oriented to person, place, and time. Moving all extremities equally Skin: RUE picc line is c/d/i  Psychiatric: a normal mood and affect.  behavior is normal.    Lab Results Recent Labs    09/23/17 0527 09/24/17 0453  WBC 11.5* 10.9*  HGB 10.4* 11.0*  HCT 30.9* 33.6*  NA 133* 132*  K 3.3* 3.5  CL 98* 98*  CO2 28 27  BUN <5* <5*  CREATININE 0.52 0.53   Lab Results  Component Value Date   ESRSEDRATE 47 (H) 09/13/2017   Lab Results  Component Value Date   CRP 24.8 (H) 09/13/2017    Microbiology: 11/25 blood cx NGTD Studies/Results: No results found.  Assessment/Plan: MSSA large cervico-thoracic spinal epidural abscess = continue on nafcillin plus oral rifampin while she is here in the hospital but will plan on transitioning her to   For discharge -  IV cefazolin 2gm IV Q 8hr plus oral rifampin 300mg  BID, plan for a total of 8 wks. Will place OPAT orders  Spoke with case manager that they are trying to arrange for [place] nursing placement  She will need prolonged oral abtx thereafter. Recommend that  she has bactrim DS 1 bid plus rifampin 300mg  bid.  mssa bacteremia = last positive blood cx on 11/25. TEE is negative  Pain management = appears to have acute pain on chronic pain from her surgery. May consider another PRN dose change or possibly muscle relaxant  Polysubstance abuse = she is motivated to do what is necessary medically to improve her health. I think she would be good candidate for SNF but does not have sufficient structure at home.    [name] [facility] Cell: [phone] Pager: [phone]  09/24/2017, 3:59 PM

## 2017-09-25 DIAGNOSIS — R197 Diarrhea, unspecified: Secondary | ICD-10-CM

## 2017-09-25 LAB — CBC
HCT: 32.9 % — ABNORMAL LOW (ref 36.0–46.0)
Hemoglobin: 10.8 g/dL — ABNORMAL LOW (ref 12.0–15.0)
MCH: 30.5 pg (ref 26.0–34.0)
MCHC: 32.8 g/dL (ref 30.0–36.0)
MCV: 92.9 fL (ref 78.0–100.0)
PLATELETS: 972 10*3/uL — AB (ref 150–400)
RBC: 3.54 MIL/uL — AB (ref 3.87–5.11)
RDW: 16.1 % — ABNORMAL HIGH (ref 11.5–15.5)
WBC: 11.9 10*3/uL — ABNORMAL HIGH (ref 4.0–10.5)

## 2017-09-25 LAB — BASIC METABOLIC PANEL
ANION GAP: 7 (ref 5–15)
BUN: 5 mg/dL — AB (ref 6–20)
CALCIUM: 8.5 mg/dL — AB (ref 8.9–10.3)
CO2: 30 mmol/L (ref 22–32)
Chloride: 98 mmol/L — ABNORMAL LOW (ref 101–111)
Creatinine, Ser: 0.65 mg/dL (ref 0.44–1.00)
GFR calc Af Amer: >60 mL/min (ref >60)
GLUCOSE: 97 mg/dL (ref 65–99)
Potassium: 3.4 mmol/L — ABNORMAL LOW (ref 3.5–5.1)
Sodium: 135 mmol/L (ref 135–145)

## 2017-09-25 LAB — RETICULOCYTES
RBC.: 3.35 MIL/uL — ABNORMAL LOW (ref 3.87–5.11)
RETIC COUNT ABSOLUTE: 120.6 10*3/uL (ref 19.0–186.0)
RETIC CT PCT: 3.6 % — AB (ref 0.4–3.1)

## 2017-09-25 LAB — IRON AND TIBC
IRON: 24 ug/dL — AB (ref 28–170)
Saturation Ratios: 10 % — ABNORMAL LOW (ref 10.4–31.8)
TIBC: 232 ug/dL — AB (ref 250–450)
UIBC: 208 ug/dL

## 2017-09-25 LAB — FERRITIN: FERRITIN: 193 ng/mL (ref 11–307)

## 2017-09-25 LAB — SAVE SMEAR

## 2017-09-25 LAB — FOLATE: Folate: 12.5 ng/mL (ref >5.9)

## 2017-09-25 LAB — VITAMIN B12: Vitamin B-12: 679 pg/mL (ref 180–914)

## 2017-09-25 LAB — MAGNESIUM: Magnesium: 2 mg/dL (ref 1.7–2.4)

## 2017-09-25 NOTE — Progress Notes (Addendum)
PROGRESS NOTE    [name]  UUV:[id number] DOB: [birth year] DOA: 09/11/2017 PCP: Marliss Coots, NP   Brief Narrative:  51 year old WF PMHx  PTSD, Depression IV drug use, Chronic Back Pain S/P previous back surgery ADD, Bilateral ovarian cysts,  PID, chlamydia, sciatica,    who presented to the ED with severe back and neck pain.  An MRI of the T and L-spine revealed a ventral epidural abscess.  Patient was also noted to have MSSA bacteremia.     Patient was placed empirically on IV antibiotics.  ID and neurosurgery consulted.  Patient was subsequently transferred to [facility].    Subjective: 12/1 A/O 4, negative CP, negative SOB, did not ambulate today however did sit in chair. Positive continue diarrhea. Positive abdominal pain. Positive nausea but is eating.    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 Carlyle Basques ID concerning treatment plan. Patient will require 8 weeks of IV antibiotics. -Obtain CT abdomen pelvis per ID recommendation -TEE negative vegetations -LCSW working on obtaining placement [facility]. Also obtaining patient at [facility] in [place]. Today unsuccessful.     Hypokalemia -K-Dur meq x 2 doses   Polysubstance abuse/IV drug use  -Review of EMR shows Possessed suspected drugs and definite drug paraphernalia at time of admission - no clear evidence of withdrawal at this time - DO NOT [place] w/ IV access  -Patient cannot be  discharged with IV access -11/30 right power glide midline placed  Acute on Chronic pain syndrome/Pain management -DC IV narcotics -Cymbalta 60 mg daily -Methocarbamol 750 mg TID -OxyContin 10 mg BID -OxyIR 10 mg PRN   Hep C+ -Hepatitis C RNA quant: 2,240,000  -Will notify idea findings since patient will still be hospitalized for above infection. Doubt they would choose to start treatment until she finishes current antibiotics. -Reconsult ID and discuss if starting hepatitis C treatment concurrently would be a choice.  Thrombocytosis -Platelets have been climbing reactive? Secondary to her spinal abscess and postoperative stress. -ReConsult hematology on 12/1. Patient was last seen by Dr. Lurline Del on 11/18 for R/O lymphoma -Obtain peripheral blood smear, anemia panel.       Morbid Obesity - Body mass index is 41.21 kg/m.  Diarrhea -Most likely secondary to multiple laxatives which were discontinued on 11/30. If patient continues to have diarrhea on 12/3 will run stools for C. difficile given she is on long-term antibiotics   DVT prophylaxis: SCD Code Status: Full Family Communication: None Disposition Plan: TBD   Consultants:  Hematology/ Oncology ID Neurosurgery Cardiology  Procedures/Significant Events:  Right arm power glide midline 11/30>>>    I have personally reviewed and interpreted all radiology studies and my findings are as above.  VENTILATOR SETTINGS:    Cultures   Antimicrobials: Anti-infectives (From admission, onward)   Start     Stop   09/16/17 2300  nafcillin 2 g in dextrose 5 %  100 mL IVPB  Status:  Discontinued     09/15/17 2303   09/16/17 1400  rifampin (RIFADIN) capsule 300 mg         09/15/17 2315  nafcillin 2 g in dextrose 5 % 100 mL IVPB         09/15/17 1237  bacitracin 50,000 Units in sodium chloride irrigation 0.9 % 500 mL irrigation  Status:  Discontinued     09/15/17 1433   09/13/17 1200  nafcillin 2 g in dextrose  5 % 100 mL IVPB  Status:  Discontinued     09/15/17 2035   09/12/17 1400  ceFAZolin (ANCEF) IVPB 2g/100 mL premix  Status:  Discontinued     09/13/17 0918   09/12/17 1000  vancomycin (VANCOCIN) 1,250 mg in sodium chloride 0.9 % 250 mL IVPB  Status:  Discontinued     09/12/17 0828   09/12/17 0600  ceFEPIme (MAXIPIME) 1 g in dextrose 5 % 50 mL IVPB  Status:  Discontinued     09/12/17 0827   09/11/17 1830  vancomycin (VANCOCIN) 2,000 mg in sodium chloride 0.9 % 500 mL IVPB     09/12/17 0026   09/11/17 1815  ceFEPIme (MAXIPIME) 2 g in dextrose 5 % 50 mL IVPB     09/11/17 2208   09/11/17 1800  piperacillin-tazobactam (ZOSYN) IVPB 3.375 g  Status:  Discontinued     09/11/17 1808       Devices    LINES / TUBES:      Continuous Infusions: . nafcillin IV Stopped (09/25/17 1148)     Objective: Vitals:   09/25/17 0442 09/25/17 0811 09/25/17 1120 09/25/17 1549  BP: 116/70 (!) 102/55 131/83 124/77  Pulse: 73 68 72 81  Resp: 15 17 (!) 24 15  Temp: 98.3 F (36.8 C) 98.2 F (36.8 C) 98.3 F (36.8 C) 98.3 F (36.8 C)  TempSrc: Oral Oral Oral Oral  SpO2: 100% 96% 96% 96%  Weight:      Height:        Intake/Output Summary (Last 24 hours) at 09/25/2017 1650 Last data filed at 09/25/2017 1336 Gross per 24 hour  Intake 491 ml  Output 500 ml  Net -9 ml   Filed Weights   09/13/17 0730 09/14/17 0831 09/21/17 0400  Weight: 258 lb 13.1 oz (117.4 kg) 255 lb 4.7 oz (115.8 kg) 244 lb 14.9 oz (111.1 kg)     Physical Exam:  General: A/O 4, No acute respiratory distress Neck:  Negative scars, masses, torticollis, lymphadenopathy, JVD Lungs: Clear to auscultation bilaterally without wheezes or crackles Cardiovascular: Regular rate and rhythm without murmur gallop or rub normal S1 and S2 Abdomen: MORBIDLY OBESE, negative abdominal pain, nondistended, positive soft, bowel sounds, no rebound, no ascites, no appreciable mass Extremities: No significant cyanosis, clubbing, or edema  bilateral lower extremities Skin: Negative rashes, lesions, ulcers Psychiatric:  Negative depression, negative anxiety, negative fatigue, negative mania  Central nervous system:  Cranial nerves II through XII intact, tongue/uvula midline, all extremities muscle strength 5/5, sensation intact throughout, finger nose finger bilateral within normal limits, quick finger touch bilateral within normal limits,  negative dysarthria, negative expressive aphasia, negative receptive aphasia..     Data Reviewed: Care during the described time interval was provided by me .  I have reviewed this patient's available data, including medical history, events of note, physical examination, and all test results as part of my evaluation.   CBC: Recent Labs  Lab 09/19/17 0324 09/21/17 0459 09/23/17 0527 09/24/17  3149 09/25/17 0644  WBC 16.9* 14.0* 11.5* 10.9* 11.9*  HGB 10.0* 9.8* 10.4* 11.0* 10.8*  HCT 29.9* 29.8* 30.9* 33.6* 32.9*  MCV 89.5 91.1 91.2 91.8 92.9  PLT 377 594* 869* 876* 702*   Basic Metabolic Panel: Recent Labs  Lab 09/20/17 0357 09/21/17 0459 09/23/17 0527 09/24/17 0453 09/25/17 0644  NA 135 132* 133* 132* 135  K 3.4* 3.6 3.3* 3.5 3.4*  CL 101 97* 98* 98* 98*  CO2 28 25 28 27 30   GLUCOSE 103* 141* 116* 115* 97  BUN 5* 5* <5* <5* 5*  CREATININE 0.54 0.62 0.52 0.53 0.65  CALCIUM 7.7* 7.7* 8.3* 8.2* 8.5*  MG  --   --  2.1 2.2 2.0   GFR: Estimated Creatinine Clearance: 108.6 mL/min (by C-G formula based on SCr of 0.65 mg/dL). Liver Function Tests: Recent Labs  Lab 09/19/17 0324  AST 22  ALT 13*  ALKPHOS 371*  BILITOT 2.3*  PROT 5.7*  ALBUMIN 1.7*   No results for input(s): LIPASE, AMYLASE in the last 168 hours. No results for input(s): AMMONIA in the last 168 hours. Coagulation Profile: No results for input(s): INR, PROTIME in the last 168 hours. Cardiac Enzymes: No results for input(s): CKTOTAL, CKMB, CKMBINDEX, TROPONINI in the last 168 hours. BNP (last 3  results) No results for input(s): PROBNP in the last 8760 hours. HbA1C: No results for input(s): HGBA1C in the last 72 hours. CBG: No results for input(s): GLUCAP in the last 168 hours. Lipid Profile: No results for input(s): CHOL, HDL, LDLCALC, TRIG, CHOLHDL, LDLDIRECT in the last 72 hours. Thyroid Function Tests: No results for input(s): TSH, T4TOTAL, FREET4, T3FREE, THYROIDAB in the last 72 hours. Anemia Panel: Recent Labs    09/25/17 0859  VITAMINB12 679  FOLATE 12.5  FERRITIN 193  TIBC 232*  IRON 24*  RETICCTPCT 3.6*   Urine analysis:    Component Value Date/Time   COLORURINE YELLOW 09/11/2017 0709   APPEARANCEUR HAZY (A) 09/11/2017 0709   LABSPEC 1.020 09/11/2017 0709   PHURINE 6.0 09/11/2017 0709   GLUCOSEU NEGATIVE 09/11/2017 0709   HGBUR SMALL (A) 09/11/2017 0709   BILIRUBINUR NEGATIVE 09/11/2017 0709   KETONESUR NEGATIVE 09/11/2017 0709   PROTEINUR NEGATIVE 09/11/2017 0709   UROBILINOGEN 0.2 06/27/2015 0225   NITRITE NEGATIVE 09/11/2017 0709   LEUKOCYTESUR LARGE (A) 09/11/2017 0709   Sepsis Labs: @LABRCNTIP (procalcitonin:4,lacticidven:4)  ) Recent Results (from the past 240 hour(s))  Culture, blood (Routine X 2) w Reflex to ID Panel     Status: Abnormal   Collection Time: 09/17/17  7:53 AM  Result Value Ref Range Status   Specimen Description BLOOD RIGHT HAND  Final   Special Requests IN PEDIATRIC BOTTLE Blood Culture adequate volume  Final   Culture  Setup Time   Final    GRAM POSITIVE COCCI AEROBIC BOTTLE ONLY CRITICAL VALUE NOTED.  VALUE IS CONSISTENT WITH PREVIOUSLY REPORTED AND CALLED VALUE.    Culture (A)  Final    STAPHYLOCOCCUS AUREUS SUSCEPTIBILITIES PERFORMED ON PREVIOUS CULTURE WITHIN THE LAST 5 DAYS.    Report Status 09/20/2017 FINAL  Final  Culture, blood (Routine X 2) w Reflex to ID Panel     Status: None   Collection Time: 09/19/17  8:25 AM  Result Value Ref Range Status   Specimen Description BLOOD RIGHT HAND  Final   Special  Requests IN PEDIATRIC BOTTLE Blood Culture adequate volume  Final   Culture NO GROWTH 5 DAYS  Final   Report Status 09/24/2017 FINAL  Final         Radiology Studies: No results found.      Scheduled Meds: . Chlorhexidine Gluconate Cloth  6 each Topical Q2200  . DULoxetine  60 mg Oral Daily  . oxyCODONE  10 mg Oral Q12H  . rifampin  300 mg Oral Q12H  . sodium chloride flush  10-40 mL Intracatheter Q12H  . sodium chloride flush  10-40 mL Intracatheter Q12H   Continuous Infusions: . nafcillin IV Stopped (09/25/17 1148)     LOS: 14 days    Time spent: 40 minutes    WOODS, Geraldo Docker, MD [facility] Pager [phone]   If 7PM-7AM, please contact night-coverage [email or web address] Password [email or web address] 09/25/2017, 4:50 PM

## 2017-09-25 NOTE — Progress Notes (Signed)
CSW followed up with [facility] to see if referral had been reviewed by admissions staff at this point. Receptionist stated that no Education officer, museum or admissions counselor was working this weekend and she could not verify if this referral had been reviewed. CSW to continue to follow.  Madilyn Fireman, MSW, LCSW-A Weekend Clinical Social Worker [phone]

## 2017-09-25 NOTE — Progress Notes (Signed)
CRITICAL VALUE ALERT  Critical Value:  Platelets 972  Date & Time Notied: 09/25/17 @0740   Provider Notified: Dr Sherral Hammers via Text Page

## 2017-09-26 DIAGNOSIS — Z419 Encounter for procedure for purposes other than remedying health state, unspecified: Secondary | ICD-10-CM

## 2017-09-26 LAB — CBC
HCT: 32.4 % — ABNORMAL LOW (ref 36.0–46.0)
Hemoglobin: 10.5 g/dL — ABNORMAL LOW (ref 12.0–15.0)
MCH: 30 pg (ref 26.0–34.0)
MCHC: 32.4 g/dL (ref 30.0–36.0)
MCV: 92.6 fL (ref 78.0–100.0)
PLATELETS: 947 10*3/uL — AB (ref 150–400)
RBC: 3.5 MIL/uL — ABNORMAL LOW (ref 3.87–5.11)
RDW: 16.4 % — AB (ref 11.5–15.5)
WBC: 10.2 10*3/uL (ref 4.0–10.5)

## 2017-09-26 LAB — BASIC METABOLIC PANEL
Anion gap: 8 (ref 5–15)
BUN: 5 mg/dL — AB (ref 6–20)
CHLORIDE: 97 mmol/L — AB (ref 101–111)
CO2: 29 mmol/L (ref 22–32)
CREATININE: 0.56 mg/dL (ref 0.44–1.00)
Calcium: 8.5 mg/dL — ABNORMAL LOW (ref 8.9–10.3)
Glucose, Bld: 95 mg/dL (ref 65–99)
Potassium: 3.3 mmol/L — ABNORMAL LOW (ref 3.5–5.1)
SODIUM: 134 mmol/L — AB (ref 135–145)

## 2017-09-26 LAB — MAGNESIUM: MAGNESIUM: 2 mg/dL (ref 1.7–2.4)

## 2017-09-26 MED ORDER — POTASSIUM CHLORIDE CRYS ER 20 MEQ PO TBCR
40.0000 meq | EXTENDED_RELEASE_TABLET | Freq: Two times a day (BID) | ORAL | Status: AC
  Administered 2017-09-26 (×2): 40 meq via ORAL
  Filled 2017-09-26 (×2): qty 2

## 2017-09-26 NOTE — Progress Notes (Signed)
PROGRESS NOTE    [name]  BOF:[id number] DOB: 08-13-1966 DOA: 09/11/2017 PCP: Marliss Coots, NP   Brief Narrative:  51 year old WF PMHx  PTSD, Depression IV drug use, Chronic Back Pain S/P previous back surgery ADD, Bilateral ovarian cysts,  PID, chlamydia, sciatica,    who presented to the ED with severe back and neck pain.  An MRI of the T and L-spine revealed a ventral epidural abscess.  Patient was also noted to have MSSA bacteremia.     Patient was placed empirically on IV antibiotics.  ID and neurosurgery consulted.  Patient was subsequently transferred to [facility].    Subjective: 12/2 A/O 4, negative CP, negative SOB. Ambulated today. Set chair today. States continues to have pain but improving. 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 Carlyle Basques ID concerning treatment plan. Patient will require 8 weeks of IV antibiotics. -Obtain CT abdomen pelvis per ID recommendation -TEE negative vegetations -LCSW working on obtaining placement [facility]. Also obtaining patient at [facility] in [place]. Today unsuccessful.     Hypokalemia -K-Dur 40 meq 2 doses   Polysubstance abuse/IV drug use  -Review of EMR shows Possessed suspected drugs and definite drug paraphernalia at time of admission - no clear evidence of withdrawal at this time - DO NOT SEND  HOME w/ IV access  -Patient cannot be  discharged with IV access -11/30 right power glide midline placed  Acute on Chronic pain syndrome/Pain management -DC IV narcotics -Cymbalta 60 mg daily -Methocarbamol 750 mg TID -OxyContin 10 mg BID -OxyIR 10 mg PRN   Hep C+ -Hepatitis C RNA quant: 2,240,000  -Will notify idea findings since patient will still be hospitalized for above infection. Doubt they would choose to start treatment until she finishes current antibiotics. -On 12/3 Reconsult ID and discuss if they would start Hepatitis C treatment at this admission. My guess is that they will want patient to complete treatment for spinal abscess first and plan as outpatient began treatment. .  Thrombocytosis -Platelets have been climbing reactive? Secondary to her spinal abscess and postoperative stress. -ReConsult hematology on 12/1. Patient was last seen by Dr. Lurline Del on 11/18 for R/O lymphoma -Peripheral blood smear pathology reading pending (spoke with pathologist) -Appears may be resolving. Continue to trend   Morbid Obesity - Body mass index is 41.21 kg/m.  Diarrhea -Most likely secondary to multiple laxatives which were discontinued on 11/30. If patient continues to have diarrhea on 12/3 will run stools for C. difficile given she is on long-term antibiotics -12/2 per patient stools beginning to solidify described as soft today. Hold on sending for culture.   DVT prophylaxis: SCD Code Status: Full Family Communication: None Disposition Plan: Awaiting placement   Consultants:  Hematology/ Oncology ID Neurosurgery Cardiology    Procedures/Significant Events:  Right arm power glide midline 11/30>>>  I have personally reviewed and interpreted all radiology studies and my findings are as above.  VENTILATOR SETTINGS:    Cultures   Antimicrobials: Anti-infectives (From admission, onward)   Start     Stop   09/16/17 2300  nafcillin 2 g in dextrose 5 % 100 mL IVPB  Status:  Discontinued      09/15/17 2303   09/16/17 1400  rifampin (RIFADIN) capsule 300 mg         09/15/17 2315  nafcillin 2 g in dextrose 5 % 100 mL IVPB         09/15/17 1237  bacitracin 50,000 Units in sodium chloride irrigation 0.9 % 500 mL irrigation  Status:  Discontinued     09/15/17 1433   09/13/17 1200  nafcillin 2 g in dextrose 5 % 100 mL IVPB  Status:  Discontinued     09/15/17 2035   09/12/17 1400  ceFAZolin (ANCEF) IVPB 2g/100 mL premix  Status:  Discontinued     09/13/17 0918   09/12/17 1000  vancomycin (VANCOCIN) 1,250 mg in sodium chloride 0.9 % 250 mL IVPB  Status:  Discontinued     09/12/17 0828   09/12/17 0600  ceFEPIme (MAXIPIME) 1 g in dextrose 5 % 50 mL IVPB  Status:  Discontinued     09/12/17 0827   09/11/17 1830  vancomycin (VANCOCIN) 2,000 mg in sodium chloride 0.9 % 500 mL IVPB     09/12/17 0026   09/11/17 1815  ceFEPIme (MAXIPIME) 2 g in dextrose 5 % 50 mL IVPB     09/11/17 2208   09/11/17 1800  piperacillin-tazobactam (ZOSYN) IVPB 3.375 g  Status:  Discontinued     09/11/17 1808       Devices    LINES / TUBES:      Continuous Infusions: . nafcillin IV 2 g (09/26/17 0445)     Objective: Vitals:   09/25/17 1120 09/25/17 1549 09/25/17 1918 09/25/17 2355  BP: 131/83 124/77 119/68 118/64  Pulse: 72 81 70 67  Resp: (!) 24 15 (!) 22 18  Temp: 98.3 F (36.8 C) 98.3 F (36.8 C) 98.3 F (36.8 C) 98.1 F (36.7 C)  TempSrc: Oral Oral Oral Oral  SpO2: 96% 96% 98% 96%  Weight:      Height:        Intake/Output Summary (Last 24 hours) at 09/26/2017 9562 Last data filed at 09/26/2017 0159 Gross per 24 hour  Intake 730 ml  Output 1000 ml  Net -270 ml   Filed Weights   09/13/17 0730 09/14/17 0831 09/21/17 0400  Weight: 258 lb 13.1 oz (117.4 kg) 255 lb 4.7 oz (115.8 kg) 244 lb 14.9 oz (111.1 kg)     Physical Exam:  General: A/O 4, No acute respiratory distress Neck:  Negative scars, masses, torticollis, lymphadenopathy, JVD Lungs: Clear to auscultation  bilaterally without wheezes or crackles Cardiovascular: Regular rate and rhythm without murmur gallop or rub normal S1 and S2 Abdomen: MORBIDLY obese, negative abdominal pain, nondistended, positive soft, bowel sounds, no rebound, no ascites, no appreciable mass Extremities: No significant cyanosis, clubbing, or edema bilateral lower extremities Skin: Negative rashes, lesions, ulcers Psychiatric:  Negative depression, negative anxiety, negative fatigue, negative mania  Central nervous system:  Cranial nerves II through XII intact, tongue/uvula midline, all extremities muscle strength 5/5, sensation intact throughout,  negative dysarthria, negative expressive aphasia, negative receptive aphasia. .     Data Reviewed: Care during the described time interval was provided by me .  I  have reviewed this patient's available data, including medical history, events of note, physical examination, and all test results as part of my evaluation.   CBC: Recent Labs  Lab 09/21/17 0459 09/23/17 0527 09/24/17 0453 09/25/17 0644 09/26/17 0500  WBC 14.0* 11.5* 10.9* 11.9* 10.2  HGB 9.8* 10.4* 11.0* 10.8* 10.5*  HCT 29.8* 30.9* 33.6* 32.9* 32.4*  MCV 91.1 91.2 91.8 92.9 92.6  PLT 594* 869* 876* 972* 025*   Basic Metabolic Panel: Recent Labs  Lab 09/21/17 0459 09/23/17 0527 09/24/17 0453 09/25/17 0644 09/26/17 0500  NA 132* 133* 132* 135 134*  K 3.6 3.3* 3.5 3.4* 3.3*  CL 97* 98* 98* 98* 97*  CO2 25 28 27 30 29   GLUCOSE 141* 116* 115* 97 95  BUN 5* <5* <5* 5* 5*  CREATININE 0.62 0.52 0.53 0.65 0.56  CALCIUM 7.7* 8.3* 8.2* 8.5* 8.5*  MG  --  2.1 2.2 2.0 2.0   GFR: Estimated Creatinine Clearance: 108.6 mL/min (by C-G formula based on SCr of 0.56 mg/dL). Liver Function Tests: No results for input(s): AST, ALT, ALKPHOS, BILITOT, PROT, ALBUMIN in the last 168 hours. No results for input(s): LIPASE, AMYLASE in the last 168 hours. No results for input(s): AMMONIA in the last 168  hours. Coagulation Profile: No results for input(s): INR, PROTIME in the last 168 hours. Cardiac Enzymes: No results for input(s): CKTOTAL, CKMB, CKMBINDEX, TROPONINI in the last 168 hours. BNP (last 3 results) No results for input(s): PROBNP in the last 8760 hours. HbA1C: No results for input(s): HGBA1C in the last 72 hours. CBG: No results for input(s): GLUCAP in the last 168 hours. Lipid Profile: No results for input(s): CHOL, HDL, LDLCALC, TRIG, CHOLHDL, LDLDIRECT in the last 72 hours. Thyroid Function Tests: No results for input(s): TSH, T4TOTAL, FREET4, T3FREE, THYROIDAB in the last 72 hours. Anemia Panel: Recent Labs    09/25/17 0859  VITAMINB12 679  FOLATE 12.5  FERRITIN 193  TIBC 232*  IRON 24*  RETICCTPCT 3.6*   Urine analysis:    Component Value Date/Time   COLORURINE YELLOW 09/11/2017 0709   APPEARANCEUR HAZY (A) 09/11/2017 0709   LABSPEC 1.020 09/11/2017 0709   PHURINE 6.0 09/11/2017 0709   GLUCOSEU NEGATIVE 09/11/2017 0709   HGBUR SMALL (A) 09/11/2017 0709   BILIRUBINUR NEGATIVE 09/11/2017 0709   KETONESUR NEGATIVE 09/11/2017 0709   PROTEINUR NEGATIVE 09/11/2017 0709   UROBILINOGEN 0.2 06/27/2015 0225   NITRITE NEGATIVE 09/11/2017 0709   LEUKOCYTESUR LARGE (A) 09/11/2017 0709   Sepsis Labs: @LABRCNTIP (procalcitonin:4,lacticidven:4)  ) Recent Results (from the past 240 hour(s))  Culture, blood (Routine X 2) w Reflex to ID Panel     Status: Abnormal   Collection Time: 09/17/17  7:53 AM  Result Value Ref Range Status   Specimen Description BLOOD RIGHT HAND  Final   Special Requests IN PEDIATRIC BOTTLE Blood Culture adequate volume  Final   Culture  Setup Time   Final    GRAM POSITIVE COCCI AEROBIC BOTTLE ONLY CRITICAL VALUE NOTED.  VALUE IS CONSISTENT WITH PREVIOUSLY REPORTED AND CALLED VALUE.    Culture (A)  Final    STAPHYLOCOCCUS AUREUS SUSCEPTIBILITIES PERFORMED ON PREVIOUS CULTURE WITHIN THE LAST 5 DAYS.    Report Status 09/20/2017 FINAL   Final  Culture, blood (Routine X 2) w Reflex to ID Panel     Status: None   Collection Time: 09/19/17  8:25 AM  Result Value Ref Range Status   Specimen Description BLOOD RIGHT HAND  Final   Special Requests  IN PEDIATRIC BOTTLE Blood Culture adequate volume  Final   Culture NO GROWTH 5 DAYS  Final   Report Status 09/24/2017 FINAL  Final         Radiology Studies: No results found.      Scheduled Meds: . Chlorhexidine Gluconate Cloth  6 each Topical Q2200  . DULoxetine  60 mg Oral Daily  . oxyCODONE  10 mg Oral Q12H  . rifampin  300 mg Oral Q12H  . sodium chloride flush  10-40 mL Intracatheter Q12H  . sodium chloride flush  10-40 mL Intracatheter Q12H   Continuous Infusions: . nafcillin IV 2 g (09/26/17 0445)     LOS: 15 days    Time spent: 40 minutes    Shrinika Blatz, Geraldo Docker, MD [facility] Pager [phone]   If 7PM-7AM, please contact night-coverage [email or web address] Password [facility] 09/26/2017, 7:33 AM

## 2017-09-26 NOTE — Progress Notes (Signed)
Occupational Therapy Treatment Patient Details Name: [name] MRN: [id number] DOB: Oct 30, 1965 Today's Date: 09/26/2017    History of present illness 51 year old female w/ a history of IV drug use, hep C and chronic back pain status post previous back surgery who presented to the ED with severe back and neck pain.  An MRI of the T and L-spine revealed a ventral epidural abscess C2-t8. 1/21 Pt with emergent C3-7 lami and epidural abscess debridement   OT comments  Pt. Able to complete bed mobility, toileting, and continued education with techniques for performing pericare.    Follow Up Recommendations  DC plan and follow up therapy as arranged by surgeon;Supervision - Intermittent    Equipment Recommendations  3 in 1 bedside commode    Recommendations for Other Services      Precautions / Restrictions Precautions Precautions: Cervical;Fall Precaution Comments: Reviewed cervical precautions with pt Required Braces or Orthoses: Cervical Brace Cervical Brace: Hard collar;At all times       Mobility Bed Mobility Overal bed mobility: Modified Independent                Transfers Overall transfer level: Needs assistance Equipment used: None Transfers: Sit to/from [employer] Transfers Sit to Stand: Supervision Stand pivot transfers: Supervision       General transfer comment: supervision for lines and safety from bed and BSC, declined seated in recliner today due to neck pain    Balance                                           ADL either performed or assessed with clinical judgement   ADL Overall ADL's : Needs assistance/impaired                         Toilet Transfer: Min guard;Ambulation;Comfort height toilet   Toileting- Clothing Manipulation and Hygiene: Maximal assistance;Sit to/from stand Toileting - Clothing Manipulation Details (indicate cue type and reason): pt. able to perform pericare to the front but  unable to reach her buttocks, states i just cant     Functional mobility during ADLs: Min guard       Vision       Perception     Praxis      Cognition Arousal/Alertness: Awake/alert Behavior During Therapy: WFL for tasks assessed/performed Overall Cognitive Status: Within Functional Limits for tasks assessed                                          Exercises     Shoulder Instructions       General Comments      Pertinent Vitals/ Pain       Pain Assessment: 0-10 Pain Score: 8  Pain Location: neck with mobility Pain Descriptors / Indicators: Aching;Discomfort Pain Intervention(s): Limited activity within patient's tolerance;Monitored during session;Repositioned  Home Living                                          Prior Functioning/Environment              Frequency  Min 3X/week        Progress Toward Goals  OT  Goals(current goals can now be found in the care plan section)  Progress towards OT goals: Progressing toward goals     Plan Discharge plan remains appropriate    Co-evaluation                 AM-PAC PT "6 Clicks" Daily Activity     Outcome Measure   Help from another person eating meals?: None Help from another person taking care of personal grooming?: A Little Help from another person toileting, which includes using toliet, bedpan, or urinal?: A Little Help from another person bathing (including washing, rinsing, drying)?: A Little Help from another person to put on and taking off regular upper body clothing?: None Help from another person to put on and taking off regular lower body clothing?: A Little 6 Click Score: 20    End of Session Equipment Utilized During Treatment: Gait belt;Cervical collar  OT Visit Diagnosis: Unsteadiness on feet (R26.81);Muscle weakness (generalized) (M62.81);Pain   Activity Tolerance Patient tolerated treatment well;Patient limited by pain   Patient Left in  bed;with call bell/phone within reach;with bed alarm set   Nurse Communication          Time: [phone] OT Time Calculation (min): 11 min  Charges: OT General Charges $OT Visit: 1 Visit OT Treatments $Self Care/Home Management : 8-22 mins   Janice Coffin, COTA/L 09/26/2017, 9:53 AM

## 2017-09-27 DIAGNOSIS — Z8659 Personal history of other mental and behavioral disorders: Secondary | ICD-10-CM

## 2017-09-27 DIAGNOSIS — Z95828 Presence of other vascular implants and grafts: Secondary | ICD-10-CM

## 2017-09-27 LAB — BASIC METABOLIC PANEL
Anion gap: 7 (ref 5–15)
BUN: <5 mg/dL — ABNORMAL LOW (ref 6–20)
CALCIUM: 8.6 mg/dL — AB (ref 8.9–10.3)
CO2: 30 mmol/L (ref 22–32)
CREATININE: 0.56 mg/dL (ref 0.44–1.00)
Chloride: 98 mmol/L — ABNORMAL LOW (ref 101–111)
Glucose, Bld: 104 mg/dL — ABNORMAL HIGH (ref 65–99)
Potassium: 3.6 mmol/L (ref 3.5–5.1)
SODIUM: 135 mmol/L (ref 135–145)

## 2017-09-27 LAB — CBC
HCT: 32.8 % — ABNORMAL LOW (ref 36.0–46.0)
Hemoglobin: 10.5 g/dL — ABNORMAL LOW (ref 12.0–15.0)
MCH: 29.9 pg (ref 26.0–34.0)
MCHC: 32 g/dL (ref 30.0–36.0)
MCV: 93.4 fL (ref 78.0–100.0)
PLATELETS: 848 10*3/uL — AB (ref 150–400)
RBC: 3.51 MIL/uL — ABNORMAL LOW (ref 3.87–5.11)
RDW: 16.3 % — AB (ref 11.5–15.5)
WBC: 9 10*3/uL (ref 4.0–10.5)

## 2017-09-27 LAB — PATHOLOGIST SMEAR REVIEW: PATH REVIEW: REACTIVE

## 2017-09-27 LAB — MAGNESIUM: Magnesium: 2 mg/dL (ref 1.7–2.4)

## 2017-09-27 MED ORDER — CEFAZOLIN SODIUM-DEXTROSE 2-4 GM/100ML-% IV SOLN
2.0000 g | Freq: Three times a day (TID) | INTRAVENOUS | Status: AC
  Administered 2017-09-27 – 2017-11-13 (×142): 2 g via INTRAVENOUS
  Filled 2017-09-27 (×143): qty 100

## 2017-09-27 NOTE — Social Work (Signed)
CSW spoke with [facility] which is not currently accepting LOG's and [facility] who also denied placement.   CSW spoke with Dr. Reynaldo Minium and RN Case Manager about changes to antibiotics and compliance, and followed up with [facility] about looking into placement for patient again.   CSW will continue to follow up with [facility] for potential placement, and any further discharge planning needs.   Alexander Mt, [place] Work [phone]

## 2017-09-27 NOTE — Progress Notes (Addendum)
INFECTIOUS DISEASE PROGRESS NOTE  ID: [name] is a 51 y.o. female with  Principal Problem:   Severe sepsis ([place]) Active Problems:   Hypokalemia   Lymphadenopathy   Back pain   Hyponatremia   Right ovarian cyst   Hypotension   MSSA bacteremia   Abscess in epidural space of thoracic spine   Mediastinal mass   Obesity, Class III, BMI 40-49.9 (morbid obesity) (HCC)   Leucocytosis   Neck pain   Abscess in epidural space of cervical spine  Subjective: No complaints, continued back pain.   Abtx:  Anti-infectives (From admission, onward)   Start     Dose/Rate Route Frequency Ordered Stop   09/16/17 2300  nafcillin 2 g in dextrose 5 % 100 mL IVPB  Status:  Discontinued     2 g 200 mL/hr over 30 Minutes Intravenous Every 4 hours 09/15/17 2035 09/15/17 2303   09/16/17 1400  rifampin (RIFADIN) capsule 300 mg     300 mg Oral Every 12 hours 09/16/17 1344     09/15/17 2315  nafcillin 2 g in dextrose 5 % 100 mL IVPB     2 g 200 mL/hr over 30 Minutes Intravenous Every 4 hours 09/15/17 2303     09/15/17 1237  bacitracin 50,000 Units in sodium chloride irrigation 0.9 % 500 mL irrigation  Status:  Discontinued       As needed 09/15/17 1237 09/15/17 1433   09/13/17 1200  nafcillin 2 g in dextrose 5 % 100 mL IVPB  Status:  Discontinued     2 g 200 mL/hr over 30 Minutes Intravenous Every 4 hours 09/13/17 0918 09/15/17 2035   09/12/17 1400  ceFAZolin (ANCEF) IVPB 2g/100 mL premix  Status:  Discontinued     2 g 200 mL/hr over 30 Minutes Intravenous Every 8 hours 09/12/17 0848 09/13/17 0918   09/12/17 1000  vancomycin (VANCOCIN) 1,250 mg in sodium chloride 0.9 % 250 mL IVPB  Status:  Discontinued     1,250 mg 166.7 mL/hr over 90 Minutes Intravenous Every 12 hours 09/11/17 1904 09/12/17 0828   09/12/17 0600  ceFEPIme (MAXIPIME) 1 g in dextrose 5 % 50 mL IVPB  Status:  Discontinued     1 g 100 mL/hr over 30 Minutes Intravenous Every 8 hours 09/11/17 1904 09/12/17 0827   09/11/17  1830  vancomycin (VANCOCIN) 2,000 mg in sodium chloride 0.9 % 500 mL IVPB     2,000 mg 250 mL/hr over 120 Minutes Intravenous  Once 09/11/17 1758 09/12/17 0026   09/11/17 1815  ceFEPIme (MAXIPIME) 2 g in dextrose 5 % 50 mL IVPB     2 g 100 mL/hr over 30 Minutes Intravenous  Once 09/11/17 1809 09/11/17 2208   09/11/17 1800  piperacillin-tazobactam (ZOSYN) IVPB 3.375 g  Status:  Discontinued     3.375 g 12.5 mL/hr over 240 Minutes Intravenous Every 8 hours 09/11/17 1757 09/11/17 1808      Medications:  Scheduled: . Chlorhexidine Gluconate Cloth  6 each Topical Q2200  . DULoxetine  60 mg Oral Daily  . oxyCODONE  10 mg Oral Q12H  . rifampin  300 mg Oral Q12H  . sodium chloride flush  10-40 mL Intracatheter Q12H    Objective: Vital signs in last 24 hours: Temp:  [97.8 F (36.6 C)-98.3 F (36.8 C)] 98.3 F (36.8 C) (12/03 1521) Pulse Rate:  [67-91] 70 (12/03 1521) Resp:  [15-20] 15 (12/03 1521) BP: (104-139)/(56-89) 139/77 (12/03 1521) SpO2:  [95 %-100 %] 96 % (  12/03 1521)   General appearance: alert, cooperative and no distress Resp: clear to auscultation bilaterally Cardio: regular rate and rhythm GI: normal findings: bowel sounds normal and soft, non-tender Extremities: RUE PIC is clean.   Lab Results Recent Labs    09/26/17 0500 09/27/17 0555  WBC 10.2 9.0  HGB 10.5* 10.5*  HCT 32.4* 32.8*  NA 134* 135  K 3.3* 3.6  CL 97* 98*  CO2 29 30  BUN 5* <5*  CREATININE 0.56 0.56   Liver Panel No results for input(s): PROT, ALBUMIN, AST, ALT, ALKPHOS, BILITOT, BILIDIR, IBILI in the last 72 hours. Sedimentation Rate No results for input(s): ESRSEDRATE in the last 72 hours. C-Reactive Protein No results for input(s): CRP in the last 72 hours.  Microbiology: Recent Results (from the past 240 hour(s))  Culture, blood (Routine X 2) w Reflex to ID Panel     Status: None   Collection Time: 09/19/17  8:25 AM  Result Value Ref Range Status   Specimen Description BLOOD  RIGHT HAND  Final   Special Requests IN PEDIATRIC BOTTLE Blood Culture adequate volume  Final   Culture NO GROWTH 5 DAYS  Final   Report Status 09/24/2017 FINAL  Final    Studies/Results: No results found.   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  Will change nafcillin to ancef to facilitate dosing.  Plan for 6 weeks of anbx LFTs normal 11-25 She is going to be d/c to SNF Available as needed.           Bobby Rumpf MD, FACP Infectious Diseases (pager) [phone] [email or web address] 09/27/2017, 4:14 PM  LOS: 16 days

## 2017-09-27 NOTE — Progress Notes (Signed)
[facility] TEAM 1 - Stepdown/ICU TEAM  [name]  ALP:[id number] DOB: [birth year] DOA: 09/11/2017 PCP: Marliss Coots, NP    Brief Narrative:  51 year old female w/ a history of IV drug use and chronic back pain status post previous back surgery who presented to the ED with severe back and neck pain.  An MRI of the T and L-spine revealed a ventral epidural abscess.  Patient was also noted to have MSSA bacteremia.    Patient was placed empirically on IV antibiotics.  ID and Neurosurgery consulted.  Patient was subsequently transferred to [facility].  Significant Events: 11/17 admit to [facility]  11/18 transfer to [facility] 11/21 developed urinary retention - emergent C3-C7 laminectomy for decompression of spinal cord and debridement of epidural abscess 11/26 TEE   Subjective: Medically stable at this time.  Requires extensive rehab, as well as ongoing IV abx tx.  No new acute medical issues.  Placement challenging given lack of insurance.  Voices no new complaints.  Is dramatically improved since I last saw her.    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 s/p laminectomy  ID directing care of this issue - TEE revealed normal valves w/ no vegetations - awaiting placement in SNF for ongoing abx tx as well as rehab   Hypokalemia Cont to supplement and follow as needed   Polysubstance abuse/IV drug use Possessed suspected drugs and definite drug paraphernalia at time of admission - no clear evidence of withdrawal at this time - DO NOT SEND HOME w/ IV access - doing well at present w/ current pain med regimen  Hep C+ Will need f/u after d/c in the ID clinic  Morbid Obesity - Body mass index is 39.53 kg/m.   DVT prophylaxis: SCDs Code Status: FULL CODE Family Communication: no family present at time of exam  Disposition Plan: tele - PT/OT - ready for SNF   Consultants:   Heme/Onc ID NS  Antimicrobials:  Nafcillin 11/19 > Rifampin 11/22 > Cefazolin 11/18 > 11/19 Cefepime 09/11/2017 > 09/12/2017 Vancomycin 11/17  Objective: Blood pressure 139/77, pulse 70, temperature 98.3 F (36.8 C), temperature source Oral, resp. rate 15, height 5\' 6"  (1.676 m), weight 111.1 kg (244 lb 14.9 oz), SpO2 96 %.  Intake/Output Summary (Last 24 hours) at 09/27/2017 1715 Last data filed at 09/27/2017 1352 Gross per 24 hour  Intake 1220 ml  Output -  Net 1220 ml   Filed Weights   09/13/17 0730 09/14/17 0831 09/21/17 0400  Weight: 117.4 kg (258 lb 13.1 oz) 115.8 kg (255 lb 4.7 oz) 111.1 kg (244 lb 14.9 oz)    Examination: General: No acute respiratory distress  Lungs: CTA B Cardiovascular: Regular rate and rhythm - no M or rub   CBC: Recent Labs  Lab 09/23/17 0527 09/24/17 0453 09/25/17 0644 09/26/17 0500 09/27/17 0555  WBC 11.5* 10.9* 11.9* 10.2 9.0  HGB 10.4* 11.0* 10.8* 10.5* 10.5*  HCT 30.9* 33.6* 32.9* 32.4* 32.8*  MCV 91.2 91.8 92.9 92.6 93.4  PLT 869* 876* 972* 947* 353*   Basic Metabolic Panel: Recent Labs  Lab 09/23/17 0527 09/24/17 0453 09/25/17 0644 09/26/17 0500 09/27/17 0555  NA 133* 132* 135 134* 135  K 3.3* 3.5 3.4* 3.3* 3.6  CL 98* 98* 98* 97* 98*  CO2 28 27 30 29 30   GLUCOSE 116* 115* 97 95 104*  BUN <5* <5* 5* 5* <5*  CREATININE 0.52 0.53  0.65 0.56 0.56  CALCIUM 8.3* 8.2* 8.5* 8.5* 8.6*  MG 2.1 2.2 2.0 2.0 2.0   GFR: Estimated Creatinine Clearance: 108.6 mL/min (by C-G formula based on SCr of 0.56 mg/dL).  Liver Function Tests: No results for input(s): AST, ALT, ALKPHOS, BILITOT, PROT, ALBUMIN in the last 168 hours.  HbA1C: Hgb A1c MFr Bld  Date/Time Value Ref Range Status  04/11/2017 06:20 PM 5.9 (H) 4.8 - 5.6 % Final    Comment:    (NOTE)         Pre-diabetes: 5.7 - 6.4         Diabetes: >6.4         Glycemic control for adults with diabetes: <7.0      Recent Results (from the past 240 hour(s))  Culture,  blood (Routine X 2) w Reflex to ID Panel     Status: None   Collection Time: 09/19/17  8:25 AM  Result Value Ref Range Status   Specimen Description BLOOD RIGHT HAND  Final   Special Requests IN PEDIATRIC BOTTLE Blood Culture adequate volume  Final   Culture NO GROWTH 5 DAYS  Final   Report Status 09/24/2017 FINAL  Final     Scheduled Meds: . Chlorhexidine Gluconate Cloth  6 each Topical Q2200  . DULoxetine  60 mg Oral Daily  . oxyCODONE  10 mg Oral Q12H  . rifampin  300 mg Oral Q12H  . sodium chloride flush  10-40 mL Intracatheter Q12H     LOS: 16 days   Cherene Altes, MD [facility] Office  [phone] Pager - Text Page per Amion as per below:  On-Call/Text Page:      [email or web address]      password [email or web address]  If 7PM-7AM, please contact night-coverage [email or web address] Password [email or web address] 09/27/2017, 5:15 PM

## 2017-09-28 NOTE — Progress Notes (Signed)
Patient refused bed alarm. Fall education provided. Patient verbalizes understanding

## 2017-09-28 NOTE — Progress Notes (Signed)
RN gave patient some pain medication and noticed that her right hand stayed "closed" even after taking the pills. RN asked patient if she still had the medication in her hand and she said no- as she quickly placed her hand under the blanket. RN asked patient to open her hands and remove the blanket- no pills were found at that time. Agricultural consultant notified. Will continue to monitor

## 2017-09-28 NOTE — Progress Notes (Signed)
Occupational Therapy Treatment Patient Details Name: [name] MRN: [id number] DOB: 06-Dec-1965 Today's Date: 09/28/2017    History of present illness 51 year old female w/ a history of IV drug use, hep C and chronic back pain status post previous back surgery who presented to the ED with severe back and neck pain.  An MRI of the T and L-spine revealed a ventral epidural abscess C2-t8. 1/21 Pt with emergent C3-7 lami and epidural abscess debridement   OT comments  Pt progressing towards established OT goals and demonstrating increase in functional performance. Pt performing toileting, hand hygiene, and functional mobility at supervision level for safety. Required Min A for cervical collar management. Pt performing theraputty exercises with educational cues. Will continue to follow acutely to facilitate safe dc.   Follow Up Recommendations  DC plan and follow up therapy as arranged by surgeon;Supervision - Intermittent    Equipment Recommendations  3 in 1 bedside commode    Recommendations for Other Services      Precautions / Restrictions Precautions Precautions: Cervical;Fall Required Braces or Orthoses: Cervical Brace Cervical Brace: Hard collar;At all times Restrictions Weight Bearing Restrictions: No       Mobility Bed Mobility Overal bed mobility: Modified Independent Bed Mobility: Supine to Sit;Sit to Supine     Supine to sit: Modified independent (Device/Increase time) Sit to supine: Modified independent (Device/Increase time)   General bed mobility comments: Good control  Transfers Overall transfer level: Needs assistance Equipment used: None Transfers: Sit to/from Stand Sit to Stand: Supervision         General transfer comment: supervision for safety    Balance Overall balance assessment: Needs assistance Sitting-balance support: Feet supported;No upper extremity supported Sitting balance-Leahy Scale: Good     Standing balance support: No  upper extremity supported;During functional activity Standing balance-Leahy Scale: Good                             ADL either performed or assessed with clinical judgement   ADL Overall ADL's : Needs assistance/impaired     Grooming: Wash/dry hands;Supervision/safety;Standing Grooming Details (indicate cue type and reason): Pt performing hand hygiene with supervision at sink.          Upper Body Dressing : Minimal assistance;Sitting Upper Body Dressing Details (indicate cue type and reason): Pt donning cervical collar with Min A for repositioning.      Toilet Transfer: Ambulation;Comfort height [employer] Details (indicate cue type and reason): Pt performing toilet transfer with supervision Toileting- Clothing Manipulation and Hygiene: Sit to/from stand;Supervision/safety Toileting - Clothing Manipulation Details (indicate cue type and reason): Pt performing peri care with sueprvision and cues for safety     Functional mobility during ADLs: Supervision/safety General ADL Comments: Pt performing toileting, hand hygiene, and funcitonal mobility at supervision level.     Vision       Perception     Praxis      Cognition Arousal/Alertness: Awake/alert Behavior During Therapy: WFL for tasks assessed/performed Overall Cognitive Status: Within Functional Limits for tasks assessed                                          Exercises Exercises: Other exercises Other Exercises Other Exercises: Reviewed theraputty excercises. Had pt place 6 paper clips in theraputty and then pull them out of putty to increase finger/hand strength. Pt demonstrating  understanding   Shoulder Instructions       General Comments      Pertinent Vitals/ Pain       Pain Assessment: Faces Pain Score: 8  Faces Pain Scale: Hurts little more Pain Location: neck and shoulders with mobility Pain Descriptors / Indicators:  Aching;Discomfort Pain Intervention(s): Monitored during session;Limited activity within patient's tolerance;Repositioned  Home Living                                          Prior Functioning/Environment              Frequency  Min 3X/week        Progress Toward Goals  OT Goals(current goals can now be found in the care plan section)  Progress towards OT goals: Progressing toward goals  Acute Rehab OT Goals Patient Stated Goal: get better and reduce pain OT Goal Formulation: With patient Time For Goal Achievement: 10/01/17 Potential to Achieve Goals: Good ADL Goals Pt Will Perform Grooming: with set-up;with supervision;standing Pt Will Perform Upper Body Dressing: with set-up;with supervision;sitting(Adhering to cervical precautions) Pt Will Perform Lower Body Dressing: with set-up;with supervision;sit to/from stand;with adaptive equipment Pt Will Transfer to Toilet: with supervision;with set-up;ambulating;bedside commode Pt Will Perform Toileting - Clothing Manipulation and hygiene: with supervision;with set-up;sit to/from stand;with adaptive equipment Pt/caregiver will Perform Home Exercise Program: Increased strength;Both right and left upper extremity;With theraputty;With Supervision;With written HEP provided  Plan Discharge plan remains appropriate    Co-evaluation                 AM-PAC PT "6 Clicks" Daily Activity     Outcome Measure   Help from another person eating meals?: None Help from another person taking care of personal grooming?: A Little Help from another person toileting, which includes using toliet, bedpan, or urinal?: A Little Help from another person bathing (including washing, rinsing, drying)?: A Little Help from another person to put on and taking off regular upper body clothing?: None Help from another person to put on and taking off regular lower body clothing?: A Little 6 Click Score: 20    End of Session  Equipment Utilized During Treatment: Gait belt;Cervical collar  OT Visit Diagnosis: Unsteadiness on feet (R26.81);Muscle weakness (generalized) (M62.81);Pain Pain - part of body: (neck)   Activity Tolerance Patient tolerated treatment well;Patient limited by pain   Patient Left with call bell/phone within reach;in chair   Nurse Communication Mobility status;Patient requests pain meds        Time: 0981-1914 OT Time Calculation (min): 16 min  Charges: OT General Charges $OT Visit: 1 Visit OT Treatments $Self Care/Home Management : 8-22 mins  [place], OTR/L Acute Rehab Pager: [phone] Office: [place] 09/28/2017, 1:42 PM

## 2017-09-28 NOTE — Care Management Note (Signed)
Case Management Note  Patient Details  Name: [name] MRN: [id number] Date of Birth: 1965-12-13  Subjective/Objective:     51 year old female w/ a history of IV drug use, hep C and chronic back pain status post previous back surgery who presented to the ED with severe back and neck pain.  An MRI of the T and L-spine revealed a ventral epidural abscess C2-t8. 1/21 Pt with emergent C3-7 lami and epidural abscess debridement.  PTA, pt independent, lives in a boarding house.                Action/Plan: PT recommending HHPT, DME for home.  Pt plans to dc home with sister at dc. Pt uninsured; uncertain if pt will qualify for home therapies with current diagnosis as uninsured pt.  Will verify this.  Pt may need medication assistance at discharge.  Will follow progress.   Expected Discharge Date:                  Expected Discharge Plan:  Skilled Nursing Facility  In-House Referral:  Clinical Social Work  Discharge planning Services  CM Consult  Post Acute Care Choice:    Choice offered to:     DME Arranged:    DME Agency:     HH Arranged:    [place] Agency:     Status of Service:  In process, will continue to follow  If discussed at Long Length of Stay Meetings, dates discussed:    Additional Comments:  09/28/17 J. Roseann Kees, RN, BSN Pt requires total of 6 weeks IV antibiotic therapy for treatment of MSSA bacteremia and spinal abscess.  Pt with recent heroin use (4 months ago), and we will not dc home with PICC line.  CSW following to facilitate discharge to SNF for IV antibiotic therapy.  Disposition difficult due to no payor source and IVDU.  Will continue to follow/assist with discharge planning.    Reinaldo Raddle, RN, BSN  Trauma/Neuro ICU Case Manager [phone]

## 2017-09-28 NOTE — Progress Notes (Signed)
[place] TEAM 1 - Stepdown/ICU TEAM  [name]  AST:[id number] DOB: 05-Feb-1966 DOA: 09/11/2017 PCP: Marliss Coots, NP    Brief Narrative:  51 year old female w/ a history of IV drug use and chronic back pain status post previous back surgery who presented to the ED with severe back and neck pain.  An MRI of the T and L-spine revealed a ventral epidural abscess.  Patient was also noted to have MSSA bacteremia.    Patient was placed empirically on IV antibiotics.  ID and Neurosurgery consulted.  Patient was subsequently transferred to [facility].  Significant Events: 11/17 admit to [facility]  11/18 transfer to [facility] 11/21 developed urinary retention - emergent C3-C7 laminectomy for decompression of spinal cord and debridement of epidural abscess 11/26 TEE   Subjective: Resting comfortably in bed w/ no complaints at this time.    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   DVT prophylaxis: SCDs Code Status: FULL CODE Family Communication: no family present at time of exam  Disposition Plan: cont PT/OT - ready for SNF   Consultants:  Heme/Onc ID NS  Antimicrobials:  Nafcillin 11/19 > Cefazolin 12/03 > Rifampin 11/22 > 12/03 Cefazolin 11/18 > 11/19 Cefepime 09/11/2017 > 09/12/2017 Vancomycin 11/17  Objective: Blood pressure  (!) 134/91, pulse 77, temperature 98.5 F (36.9 C), temperature source Oral, resp. rate 16, height 5\' 6"  (1.676 m), weight 111.1 kg (244 lb 14.9 oz), SpO2 96 %.  Intake/Output Summary (Last 24 hours) at 09/28/2017 1733 Last data filed at 09/28/2017 1410 Gross per 24 hour  Intake 780 ml  Output 1 ml  Net 779 ml   Filed Weights   09/13/17 0730 09/14/17 0831 09/21/17 0400  Weight: 117.4 kg (258 lb 13.1 oz) 115.8 kg (255 lb 4.7 oz) 111.1 kg (244 lb 14.9 oz)    Examination: General: No acute respiratory distress  Lungs: CTA B w/o wheezing  Cardiovascular: RRR  CBC: Recent Labs  Lab 09/23/17 0527 09/24/17 0453 09/25/17 0644 09/26/17 0500 09/27/17 0555  WBC 11.5* 10.9* 11.9* 10.2 9.0  HGB 10.4* 11.0* 10.8* 10.5* 10.5*  HCT 30.9* 33.6* 32.9* 32.4* 32.8*  MCV 91.2 91.8 92.9 92.6 93.4  PLT 869* 876* 972* 947* 989*   Basic Metabolic Panel: Recent Labs  Lab 09/23/17 0527 09/24/17 0453 09/25/17 0644 09/26/17 0500 09/27/17 0555  NA 133* 132* 135 134* 135  K 3.3* 3.5 3.4* 3.3* 3.6  CL 98* 98* 98* 97* 98*  CO2 28 27 30 29 30   GLUCOSE 116* 115* 97 95 104*  BUN <5* <5* 5* 5* <5*  CREATININE 0.52 0.53 0.65 0.56 0.56  CALCIUM 8.3* 8.2* 8.5* 8.5* 8.6*  MG 2.1 2.2 2.0 2.0 2.0   GFR: Estimated Creatinine Clearance: 108.6 mL/min (by C-G formula based on SCr of 0.56  mg/dL).  Liver Function Tests: No results for input(s): AST, ALT, ALKPHOS, BILITOT, PROT, ALBUMIN in the last 168 hours.  HbA1C: Hgb A1c MFr Bld  Date/Time Value Ref Range Status  04/11/2017 06:20 PM 5.9 (H) 4.8 - 5.6 % Final    Comment:    (NOTE)         Pre-diabetes: 5.7 - 6.4         Diabetes: >6.4         Glycemic control for adults with diabetes: <7.0      Recent Results (from the past 240 hour(s))  Culture, blood (Routine X 2) w Reflex to ID Panel     Status: None   Collection Time: 09/19/17  8:25 AM  Result Value Ref Range Status   Specimen Description BLOOD RIGHT HAND  Final   Special Requests IN  PEDIATRIC BOTTLE Blood Culture adequate volume  Final   Culture NO GROWTH 5 DAYS  Final   Report Status 09/24/2017 FINAL  Final     Scheduled Meds: . Chlorhexidine Gluconate Cloth  6 each Topical Q2200  . DULoxetine  60 mg Oral Daily  . oxyCODONE  10 mg Oral Q12H  . rifampin  300 mg Oral Q12H  . sodium chloride flush  10-40 mL Intracatheter Q12H     LOS: 17 days   Cherene Altes, MD [facility] Office  [phone] Pager - Text Page per Amion as per below:  On-Call/Text Page:      [email or web address]      password [email or web address]  If 7PM-7AM, please contact night-coverage [email or web address] Password [email or web address] 09/28/2017, 5:33 PM

## 2017-09-28 NOTE — Progress Notes (Signed)
Physical Therapy Treatment Patient Details Name: [name] MRN: [id number] DOB: [birth year] Today's Date: 09/28/2017    History of Present Illness 51 year old female w/ a history of IV drug use, hep C and chronic back pain status post previous back surgery who presented to the ED with severe back and neck pain.  An MRI of the T and L-spine revealed a ventral epidural abscess C2-t8. 1/21 Pt with emergent C3-7 lami and epidural abscess debridement    PT Comments    Pt progressing with gait with ability to walk without RW short distances and increased ambulation distance with very limited HHA. Pt reports fatigue and deferred OOB but pt agreeable to get up later with RN. Pt with assist to don collar in bed and encouragement to continue to maximize function.     Follow Up Recommendations  Home health PT;Supervision for mobility/OOB     Equipment Recommendations       Recommendations for Other Services       Precautions / Restrictions Precautions Precautions: Cervical;Fall Required Braces or Orthoses: Cervical Brace Cervical Brace: Hard collar;At all times    Mobility  Bed Mobility Overal bed mobility: Modified Independent Bed Mobility: Supine to Sit;Sit to Supine     Supine to sit: Modified independent (Device/Increase time) Sit to supine: Modified independent (Device/Increase time)      Transfers Overall transfer level: Needs assistance   Transfers: Sit to/from Stand Sit to Stand: Supervision         General transfer comment: supervision for safety without need for cues or assist from bed and [facility]  Ambulation/Gait Ambulation/Gait assistance: Min assist Ambulation Distance (Feet): 500 Feet Assistive device: 1 person hand held assist Gait Pattern/deviations: Step-through pattern;Decreased stride length   Gait velocity interpretation: Below normal speed for age/gender General Gait Details: Pt walked to and from bathroom in room without AD but required min  HHA in hall for stability and safety but really more minguard    Stairs            Wheelchair Mobility    Modified Rankin (Stroke Patients Only)       Balance Overall balance assessment: Needs assistance   Sitting balance-Leahy Scale: Good       Standing balance-Leahy Scale: Good                              Cognition Arousal/Alertness: Awake/alert Behavior During Therapy: WFL for tasks assessed/performed Overall Cognitive Status: Within Functional Limits for tasks assessed                                        Exercises      General Comments        Pertinent Vitals/Pain Pain Score: 8  Pain Location: neck and shoulders with mobility Pain Descriptors / Indicators: Aching;Discomfort Pain Intervention(s): Limited activity within patient's tolerance;Repositioned;Monitored during session;Premedicated before session    Home Living                      Prior Function            PT Goals (current goals can now be found in the care plan section) Progress towards PT goals: Progressing toward goals    Frequency    Min 3X/week      PT Plan Current plan remains appropriate    Co-evaluation  AM-PAC PT "6 Clicks" Daily Activity  Outcome Measure  Difficulty turning over in bed (including adjusting bedclothes, sheets and blankets)?: None Difficulty moving from lying on back to sitting on the side of the bed? : A Little Difficulty sitting down on and standing up from a chair with arms (e.g., wheelchair, bedside commode, etc,.)?: A Little Help needed moving to and from a bed to chair (including a wheelchair)?: None Help needed walking in hospital room?: A Little Help needed climbing 3-5 steps with a railing? : A Little 6 Click Score: 20    End of Session Equipment Utilized During Treatment: Cervical collar;Gait belt Activity Tolerance: Patient tolerated treatment well Patient left: in bed;with call  bell/phone within reach;with nursing/sitter in room Nurse Communication: Mobility status PT Visit Diagnosis: Other abnormalities of gait and mobility (R26.89)     Time: 1000-1016 PT Time Calculation (min) (ACUTE ONLY): 16 min  Charges:  $Gait Training: 8-22 mins                    G Codes:       Elwyn Reach, PT [phone]    [place] 09/28/2017, 11:27 AM

## 2017-09-29 LAB — BASIC METABOLIC PANEL
ANION GAP: 13 (ref 5–15)
CALCIUM: 8.9 mg/dL (ref 8.9–10.3)
CO2: 29 mmol/L (ref 22–32)
Chloride: 90 mmol/L — ABNORMAL LOW (ref 101–111)
Creatinine, Ser: 0.61 mg/dL (ref 0.44–1.00)
GFR calc Af Amer: >60 mL/min (ref >60)
GLUCOSE: 107 mg/dL — AB (ref 65–99)
Potassium: 4 mmol/L (ref 3.5–5.1)
Sodium: 132 mmol/L — ABNORMAL LOW (ref 135–145)

## 2017-09-29 LAB — MAGNESIUM: Magnesium: 2 mg/dL (ref 1.7–2.4)

## 2017-09-29 NOTE — Progress Notes (Signed)
PROGRESS NOTE    BRIA SPARR  WUJ:[id number] DOB: 05-02-1966 DOA: 09/11/2017 PCP: Marliss Coots, NP   Brief Narrative:  51 year old WF PMHx  PTSD, Depression IV drug use, Chronic Back Pain S/P previous back surgery ADD, Bilateral ovarian cysts,  PID, chlamydia, sciatica,    who presented to the ED with severe back and neck pain.  An MRI of the T and L-spine revealed a ventral epidural abscess.  Patient was also noted to have MSSA bacteremia.     Patient was placed empirically on IV antibiotics.  ID and neurosurgery consulted.  Patient was subsequently transferred to [facility].    Subjective: 12/5 A/O 4, negative CP, negative SOB.     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 Carlyle Basques ID concerning treatment plan. Patient will require 8 weeks of IV antibiotics. -Obtain CT abdomen pelvis per ID recommendation -TEE negative vegetations -LCSW working on obtaining placement [facility]. Also obtaining patient at [facility] in [place]. Today unsuccessful.     Hypokalemia -K-Dur 40 meq 2 doses   Polysubstance abuse/IV drug use  -Review of EMR shows Possessed suspected drugs and definite drug paraphernalia at time of admission - no clear evidence of withdrawal at this time - DO NOT SEND  HOME w/ IV access  -Patient cannot be discharged with IV access -11/30 right power glide midline placed  Acute on Chronic pain syndrome/Pain management -DC IV  narcotics -Cymbalta 60 mg daily -Methocarbamol 750 mg TID -OxyContin 10 mg BID -OxyIR 10 mg PRN   Hep C+ -Hepatitis C RNA quant: 2,240,000  -Will notify idea findings since patient will still be hospitalized for above infection. Doubt they would choose to start treatment until she finishes current antibiotics. -On 12/3 Reconsult ID and discuss if they would start Hepatitis C treatment at this admission. My guess is that they will want patient to complete treatment for spinal abscess first and plan as outpatient began treatment. .  Thrombocytosis -Platelets have been climbing reactive? Secondary to her spinal abscess and postoperative stress. -ReConsult hematology on 12/1. Patient was last seen by Dr. Lurline Del on 11/18 for R/O lymphoma -Peripheral blood smear pathology reading pending (spoke with pathologist) -Appears may be resolving. Continue to trend   Morbid Obesity - Body mass index is 41.21 kg/m.  Diarrhea -Most likely secondary to multiple laxatives which were discontinued on 11/30. If patient continues to have diarrhea on 12/3 will run stools for C. difficile given she is on long-term antibiotics -12/2 per patient stools beginning to solidify described as soft today. Hold on sending for culture.   DVT prophylaxis: SCD Code Status: Full Family Communication: None Disposition Plan: Awaiting placement   Consultants:  Hematology/ Oncology ID Neurosurgery Cardiology    Procedures/Significant Events:  Right arm power glide midline 11/30>>>    I have personally reviewed and interpreted all radiology studies and my findings are as above.  VENTILATOR SETTINGS:  Cultures   Antimicrobials: Anti-infectives (From admission, onward)   Start     Stop   09/16/17 2300  nafcillin 2 g in dextrose 5 % 100 mL IVPB  Status:  Discontinued     09/15/17 2303   09/16/17 1400  rifampin (RIFADIN) capsule 300 mg         09/15/17 2315  nafcillin 2 g in dextrose 5 % 100  mL IVPB         09/15/17 1237  bacitracin 50,000 Units in sodium chloride irrigation 0.9 % 500 mL irrigation  Status:  Discontinued     09/15/17 1433   09/13/17 1200  nafcillin 2 g in dextrose 5 % 100 mL IVPB  Status:  Discontinued     09/15/17 2035   09/12/17 1400  ceFAZolin (ANCEF) IVPB 2g/100 mL premix  Status:  Discontinued     09/13/17 0918   09/12/17 1000  vancomycin (VANCOCIN) 1,250 mg in sodium chloride 0.9 % 250 mL IVPB  Status:  Discontinued     09/12/17 0828   09/12/17 0600  ceFEPIme (MAXIPIME) 1 g in dextrose 5 % 50 mL IVPB  Status:  Discontinued     09/12/17 0827   09/11/17 1830  vancomycin (VANCOCIN) 2,000 mg in sodium chloride 0.9 % 500 mL IVPB     09/12/17 0026   09/11/17 1815  ceFEPIme (MAXIPIME) 2 g in dextrose 5 % 50 mL IVPB     09/11/17 2208   09/11/17 1800  piperacillin-tazobactam (ZOSYN) IVPB 3.375 g  Status:  Discontinued     09/11/17 1808       Devices    LINES / TUBES:      Continuous Infusions: .  ceFAZolin (ANCEF) IV 2 g (09/29/17 0641)     Objective: Vitals:   09/28/17 1742 09/28/17 1954 09/28/17 2314 09/29/17 0355  BP: 117/79 120/75 127/76 125/87  Pulse: 79 70 70 66  Resp: 16 16 16 16   Temp: 98.2 F (36.8 C) 98 F (36.7 C) 97.7 F (36.5 C) 98.1 F (36.7 C)  TempSrc: Oral Oral Oral Oral  SpO2: 98% 95% 96% 98%  Weight:      Height:        Intake/Output Summary (Last 24 hours) at 09/29/2017 0756 Last data filed at 09/28/2017 1410 Gross per 24 hour  Intake 340 ml  Output 1 ml  Net 339 ml   Filed Weights   09/13/17 0730 09/14/17 0831 09/21/17 0400  Weight: 258 lb 13.1 oz (117.4 kg) 255 lb 4.7 oz (115.8 kg) 244 lb 14.9 oz (111.1 kg)     Physical Exam:  General: A/O 4, No acute respiratory distress Neck:  Negative scars, masses, torticollis, lymphadenopathy, JVD Lungs: Clear to auscultation bilaterally without wheezes or crackles Cardiovascular: Regular rate and rhythm without murmur gallop or rub normal S1 and S2 Abdomen:  MORBIDLY obese, negative abdominal pain, nondistended, positive soft, bowel sounds, no rebound, no ascites, no appreciable mass Extremities: No significant cyanosis, clubbing, or edema bilateral lower extremities Skin: Negative rashes, lesions, ulcers Psychiatric:  Negative depression, negative anxiety, negative fatigue, negative mania  Central nervous system:  Cranial nerves II through XII intact, tongue/uvula midline, all extremities muscle strength 5/5, sensation intact throughout,  negative dysarthria, negative expressive aphasia, negative receptive aphasia. .     Data Reviewed: Care during the described time interval was provided by me .  I have reviewed this patient's available data, including medical history, events of note, physical examination, and all test results as part of  my evaluation.   CBC: Recent Labs  Lab 09/23/17 0527 09/24/17 0453 09/25/17 0644 09/26/17 0500 09/27/17 0555  WBC 11.5* 10.9* 11.9* 10.2 9.0  HGB 10.4* 11.0* 10.8* 10.5* 10.5*  HCT 30.9* 33.6* 32.9* 32.4* 32.8*  MCV 91.2 91.8 92.9 92.6 93.4  PLT 869* 876* 972* 947* 956*   Basic Metabolic Panel: Recent Labs  Lab 09/23/17 0527 09/24/17 0453 09/25/17 0644 09/26/17 0500 09/27/17 0555  NA 133* 132* 135 134* 135  K 3.3* 3.5 3.4* 3.3* 3.6  CL 98* 98* 98* 97* 98*  CO2 28 27 30 29 30   GLUCOSE 116* 115* 97 95 104*  BUN <5* <5* 5* 5* <5*  CREATININE 0.52 0.53 0.65 0.56 0.56  CALCIUM 8.3* 8.2* 8.5* 8.5* 8.6*  MG 2.1 2.2 2.0 2.0 2.0   GFR: Estimated Creatinine Clearance: 108.6 mL/min (by C-G formula based on SCr of 0.56 mg/dL). Liver Function Tests: No results for input(s): AST, ALT, ALKPHOS, BILITOT, PROT, ALBUMIN in the last 168 hours. No results for input(s): LIPASE, AMYLASE in the last 168 hours. No results for input(s): AMMONIA in the last 168 hours. Coagulation Profile: No results for input(s): INR, PROTIME in the last 168 hours. Cardiac Enzymes: No results for input(s): CKTOTAL, CKMB,  CKMBINDEX, TROPONINI in the last 168 hours. BNP (last 3 results) No results for input(s): PROBNP in the last 8760 hours. HbA1C: No results for input(s): HGBA1C in the last 72 hours. CBG: No results for input(s): GLUCAP in the last 168 hours. Lipid Profile: No results for input(s): CHOL, HDL, LDLCALC, TRIG, CHOLHDL, LDLDIRECT in the last 72 hours. Thyroid Function Tests: No results for input(s): TSH, T4TOTAL, FREET4, T3FREE, THYROIDAB in the last 72 hours. Anemia Panel: No results for input(s): VITAMINB12, FOLATE, FERRITIN, TIBC, IRON, RETICCTPCT in the last 72 hours. Urine analysis:    Component Value Date/Time   COLORURINE YELLOW 09/11/2017 0709   APPEARANCEUR HAZY (A) 09/11/2017 0709   LABSPEC 1.020 09/11/2017 0709   PHURINE 6.0 09/11/2017 0709   GLUCOSEU NEGATIVE 09/11/2017 0709   HGBUR SMALL (A) 09/11/2017 0709   BILIRUBINUR NEGATIVE 09/11/2017 0709   KETONESUR NEGATIVE 09/11/2017 0709   PROTEINUR NEGATIVE 09/11/2017 0709   UROBILINOGEN 0.2 06/27/2015 0225   NITRITE NEGATIVE 09/11/2017 0709   LEUKOCYTESUR LARGE (A) 09/11/2017 0709   Sepsis Labs: @LABRCNTIP (procalcitonin:4,lacticidven:4)  ) Recent Results (from the past 240 hour(s))  Culture, blood (Routine X 2) w Reflex to ID Panel     Status: None   Collection Time: 09/19/17  8:25 AM  Result Value Ref Range Status   Specimen Description BLOOD RIGHT HAND  Final   Special Requests IN PEDIATRIC BOTTLE Blood Culture adequate volume  Final   Culture NO GROWTH 5 DAYS  Final   Report Status 09/24/2017 FINAL  Final         Radiology Studies: No results found.      Scheduled Meds: . Chlorhexidine Gluconate Cloth  6 each Topical Q2200  . DULoxetine  60 mg Oral Daily  . oxyCODONE  10 mg Oral Q12H  . rifampin  300 mg Oral Q12H  . sodium chloride flush  10-40 mL Intracatheter Q12H   Continuous Infusions: .  ceFAZolin (ANCEF) IV 2 g (09/29/17 0641)     LOS: 18 days    Time spent: 40 minutes    Roseline Ebarb,  Geraldo Docker, MD [facility] Pager [phone]   If 7PM-7AM, please contact night-coverage [email or web address] Password [email or web address] 09/29/2017, 7:56 AM

## 2017-09-30 ENCOUNTER — Inpatient Hospital Stay (HOSPITAL_COMMUNITY): Payer: Self-pay

## 2017-09-30 MED ORDER — MORPHINE SULFATE (PF) 4 MG/ML IV SOLN
2.0000 mg | Freq: Once | INTRAVENOUS | Status: AC
  Administered 2017-09-30: 2 mg via INTRAVENOUS
  Filled 2017-09-30: qty 1

## 2017-09-30 MED ORDER — HEPARIN SODIUM (PORCINE) 5000 UNIT/ML IJ SOLN
5000.0000 [IU] | Freq: Three times a day (TID) | INTRAMUSCULAR | Status: DC
  Administered 2017-09-30 – 2017-10-27 (×80): 5000 [IU] via SUBCUTANEOUS
  Filled 2017-09-30 (×78): qty 1

## 2017-09-30 MED ORDER — IOPAMIDOL (ISOVUE-370) INJECTION 76%
INTRAVENOUS | Status: AC
  Administered 2017-09-30: 100 mL
  Filled 2017-09-30: qty 100

## 2017-09-30 NOTE — Progress Notes (Signed)
PROGRESS NOTE    [name]  VPX:[id number] DOB: [birth year] DOA: 09/11/2017 PCP: Marliss Coots, NP   Brief Narrative:  51 year old WF PMHx  PTSD, Depression IV drug use, Chronic Back Pain S/P previous back surgery ADD, Bilateral ovarian cysts,  PID, chlamydia, sciatica,    who presented to the ED with severe back and neck pain.  An MRI of the T and L-spine revealed a ventral epidural abscess.  Patient was also noted to have MSSA bacteremia.     Patient was placed empirically on IV antibiotics.  ID and neurosurgery consulted.  Patient was subsequently transferred to [facility].    Subjective: 12/6  A/O 4, negative CP, positive SOB with deep inspiration, positive bilateral back pain just below shoulder blades..     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 Carlyle Basques ID concerning treatment plan. Patient will require 8 weeks of IV antibiotics. -Obtain CT abdomen pelvis per ID recommendation -TEE negative vegetations -12/6 per [place] note patient on place will therefore will have to remain at [facility] for 8 weeks of antibiotics.      Hypokalemia   Polysubstance abuse/IV drug use  -Review of EMR shows Possessed suspected drugs and definite drug paraphernalia at time of admission - no clear evidence of withdrawal at this time - DO NOT [place] w/ IV access  -Patient cannot be discharged with IV access -11/30 right power glide midline  placed  Acute on Chronic pain syndrome/Pain management -DC IV narcotics -Cymbalta 60 mg daily -Methocarbamol 750 mg TID -OxyContin 10 mg BID -OxyIR 10 mg PRN -12/6 morphine IV 2 g 1 dose (be EXTREMELY careful with any IV narcotics patient manipulative)  PE?/Back pain -Don't truly believe patient has PE however, refuses SCDs, thrombocytosis, recent surgery, new onset back pain with SOB. CT PE protocol pending -Back pain most likely secondary to immobility and body habitus.   Hep C+ -Hepatitis C RNA quant: 2,240,000  -Will notify idea findings since patient will still be hospitalized for above infection. Doubt they would choose to start treatment until she finishes current antibiotics. -On 12/3 Reconsult ID and discuss if they would start Hepatitis C treatment at this admission. My guess is that they will want patient to complete treatment for spinal abscess first and plan as outpatient began treatment. .  Thrombocytosis -Platelets have been climbing reactive? Secondary to her spinal abscess and postoperative stress. -Patient was last seen by Dr. Lurline Del on 11/18 for R/O lymphoma -Thrombocytosis resolving though extremely elevated -12/1 PBS; thrombocytosis and reactive appearing lymphocytes  Morbid Obesity - Body mass index is 41.21 kg/m.  Diarrhea -Most likely secondary to multiple laxatives which were discontinued on 11/30. If patient continues to have diarrhea on 12/3 will run stools for C. difficile given she is on long-term antibiotics -12/6 resolved    DVT prophylaxis: Subcutaneous heparin Code Status: Full Family Communication: None Disposition Plan: Will remain  at [facility] for 8 weeks of IV antibiotics   Consultants:  Hematology/ Oncology ID Neurosurgery Cardiology    Procedures/Significant Events:  Right arm power glide midline 11/30>>>    I have personally reviewed and interpreted all radiology studies and my findings are as above.  VENTILATOR  SETTINGS:    Cultures   Antimicrobials: Anti-infectives (From admission, onward)   Start     Stop   09/16/17 2300  nafcillin 2 g in dextrose 5 % 100 mL IVPB  Status:  Discontinued     09/15/17 2303   09/16/17 1400  rifampin (RIFADIN) capsule 300 mg         09/15/17 2315  nafcillin 2 g in dextrose 5 % 100 mL IVPB         09/15/17 1237  bacitracin 50,000 Units in sodium chloride irrigation 0.9 % 500 mL irrigation  Status:  Discontinued     09/15/17 1433   09/13/17 1200  nafcillin 2 g in dextrose 5 % 100 mL IVPB  Status:  Discontinued     09/15/17 2035   09/12/17 1400  ceFAZolin (ANCEF) IVPB 2g/100 mL premix  Status:  Discontinued     09/13/17 0918   09/12/17 1000  vancomycin (VANCOCIN) 1,250 mg in sodium chloride 0.9 % 250 mL IVPB  Status:  Discontinued     09/12/17 0828   09/12/17 0600  ceFEPIme (MAXIPIME) 1 g in dextrose 5 % 50 mL IVPB  Status:  Discontinued     09/12/17 0827   09/11/17 1830  vancomycin (VANCOCIN) 2,000 mg in sodium chloride 0.9 % 500 mL IVPB     09/12/17 0026   09/11/17 1815  ceFEPIme (MAXIPIME) 2 g in dextrose 5 % 50 mL IVPB     09/11/17 2208   09/11/17 1800  piperacillin-tazobactam (ZOSYN) IVPB 3.375 g  Status:  Discontinued     09/11/17 1808       Devices    LINES / TUBES:      Continuous Infusions: .  ceFAZolin (ANCEF) IV Stopped (09/30/17 1425)     Objective: Vitals:   09/30/17 1402 09/30/17 1600 09/30/17 1727 09/30/17 1941  BP: (!) 127/92  131/86 129/76  Pulse: 83  79 72  Resp: 16  16 16   Temp: 98.2 F (36.8 C) 97.7 F (36.5 C)  98.2 F (36.8 C)  TempSrc: Oral Oral  Oral  SpO2: 97%  97% 97%  Weight:      Height:        Intake/Output Summary (Last 24 hours) at 09/30/2017 2039 Last data filed at 09/30/2017 1425 Gross per 24 hour  Intake 1380 ml  Output 400 ml  Net 980 ml   Filed Weights   09/13/17 0730 09/14/17 0831 09/21/17 0400  Weight: 258 lb 13.1 oz (117.4 kg) 255 lb 4.7 oz (115.8 kg) 244 lb 14.9 oz (111.1 kg)      Physical Exam:   General: A/O 4, No acute respiratory distress Neck:  Negative scars, masses, torticollis, lymphadenopathy, JVD Lungs: Clear to auscultation bilaterally without wheezes or crackles Cardiovascular: Regular rate and rhythm without murmur gallop or rub normal S1 and S2 -pain to palpation under bilateral scapula (patient's reaction to palpation out of proportion to pressure applied) Abdomen: MORBIDLY obese, negative abdominal pain, nondistended, positive soft, bowel sounds, no rebound, no ascites, no appreciable mass Extremities: No significant cyanosis, clubbing, or edema bilateral lower extremities Skin: Negative rashes, lesions, ulcers Psychiatric:  Negative depression, negative anxiety, negative fatigue, negative mania  Central nervous  system:  Cranial nerves II through XII intact, tongue/uvula midline, all extremities muscle strength 5/5, sensation intact throughout,  negative dysarthria, negative expressive aphasia, negative receptive aphasia.       Data Reviewed: Care during the described time interval was provided by me .  I have reviewed this patient's available data, including medical history, events of note, physical examination, and all test results as part of my evaluation.   CBC: Recent Labs  Lab 09/24/17 0453 09/25/17 0644 09/26/17 0500 09/27/17 0555  WBC 10.9* 11.9* 10.2 9.0  HGB 11.0* 10.8* 10.5* 10.5*  HCT 33.6* 32.9* 32.4* 32.8*  MCV 91.8 92.9 92.6 93.4  PLT 876* 972* 947* 222*   Basic Metabolic Panel: Recent Labs  Lab 09/24/17 0453 09/25/17 0644 09/26/17 0500 09/27/17 0555 09/29/17 0749  NA 132* 135 134* 135 132*  K 3.5 3.4* 3.3* 3.6 4.0  CL 98* 98* 97* 98* 90*  CO2 27 30 29 30 29   GLUCOSE 115* 97 95 104* 107*  BUN <5* 5* 5* <5* <5*  CREATININE 0.53 0.65 0.56 0.56 0.61  CALCIUM 8.2* 8.5* 8.5* 8.6* 8.9  MG 2.2 2.0 2.0 2.0 2.0   GFR: Estimated Creatinine Clearance: 108.6 mL/min (by C-G formula based on SCr of 0.61  mg/dL). Liver Function Tests: No results for input(s): AST, ALT, ALKPHOS, BILITOT, PROT, ALBUMIN in the last 168 hours. No results for input(s): LIPASE, AMYLASE in the last 168 hours. No results for input(s): AMMONIA in the last 168 hours. Coagulation Profile: No results for input(s): INR, PROTIME in the last 168 hours. Cardiac Enzymes: No results for input(s): CKTOTAL, CKMB, CKMBINDEX, TROPONINI in the last 168 hours. BNP (last 3 results) No results for input(s): PROBNP in the last 8760 hours. HbA1C: No results for input(s): HGBA1C in the last 72 hours. CBG: No results for input(s): GLUCAP in the last 168 hours. Lipid Profile: No results for input(s): CHOL, HDL, LDLCALC, TRIG, CHOLHDL, LDLDIRECT in the last 72 hours. Thyroid Function Tests: No results for input(s): TSH, T4TOTAL, FREET4, T3FREE, THYROIDAB in the last 72 hours. Anemia Panel: No results for input(s): VITAMINB12, FOLATE, FERRITIN, TIBC, IRON, RETICCTPCT in the last 72 hours. Urine analysis:    Component Value Date/Time   COLORURINE YELLOW 09/11/2017 0709   APPEARANCEUR HAZY (A) 09/11/2017 0709   LABSPEC 1.020 09/11/2017 0709   PHURINE 6.0 09/11/2017 0709   GLUCOSEU NEGATIVE 09/11/2017 0709   HGBUR SMALL (A) 09/11/2017 0709   BILIRUBINUR NEGATIVE 09/11/2017 0709   KETONESUR NEGATIVE 09/11/2017 0709   PROTEINUR NEGATIVE 09/11/2017 0709   UROBILINOGEN 0.2 06/27/2015 0225   NITRITE NEGATIVE 09/11/2017 0709   LEUKOCYTESUR LARGE (A) 09/11/2017 0709   Sepsis Labs: @LABRCNTIP (procalcitonin:4,lacticidven:4)  ) No results found for this or any previous visit (from the past 240 hour(s)).       Radiology Studies: No results found.      Scheduled Meds: . iopamidol      . Chlorhexidine Gluconate Cloth  6 each Topical Q2200  . DULoxetine  60 mg Oral Daily  . heparin injection (subcutaneous)  5,000 Units Subcutaneous Q8H  . oxyCODONE  10 mg Oral Q12H  . rifampin  300 mg Oral Q12H  . sodium chloride flush   10-40 mL Intracatheter Q12H   Continuous Infusions: .  ceFAZolin (ANCEF) IV Stopped (09/30/17 1425)     LOS: 19 days    Time spent: 40 minutes    Abigial Newville, Geraldo Docker, MD [facility] Pager [phone]   If 7PM-7AM, please contact night-coverage [email or web address] Password [email or web address]  09/30/2017, 8:39 PM

## 2017-09-30 NOTE — Plan of Care (Signed)
Patient is doing much better. She is able to get out of the bed independently to the bathroom and able to wipe herself. Pain is still issue but giving pain medications as needed and got a k pad ordered for her to help with her back pain.

## 2017-09-30 NOTE — Progress Notes (Signed)
Physical Therapy Treatment Patient Details Name: [name] MRN: [id number] DOB: Oct 04, 1966 Today's Date: 09/30/2017    History of Present Illness 51 year old female w/ a history of IV drug use, hep C and chronic back pain status post previous back surgery who presented to the ED with severe back and neck pain.  An MRI of the T and L-spine revealed a ventral epidural abscess C2-t8. 1/21 Pt with emergent C3-7 lami and epidural abscess debridement    PT Comments    Pt progressing towards goals. Pt demonstrated stair negotiation of 2 steps using railing and ambulation with min guard assist and hand-held support. Pt c/o neck and back spasms during session. Pt requested to get back in bed, stating that lying on the heat pad in her bed seemed to be helping. Pt will be going to a SNF upon discharge due to her need for supervised IV antibiotics for 6-8 weeks. PT will follow.    Follow Up Recommendations  Supervision for mobility/OOB;SNF     Equipment Recommendations  Rolling walker with 5" wheels    Recommendations for Other Services       Precautions / Restrictions Precautions Precautions: Cervical Required Braces or Orthoses: Cervical Brace Cervical Brace: Hard collar;At all times Restrictions Weight Bearing Restrictions: No    Mobility  Bed Mobility Overal bed mobility: Modified Independent Bed Mobility: Supine to Sit     Supine to sit: Modified independent (Device/Increase time);HOB elevated     General bed mobility comments: Pt able to transfer from supine to sit EOB without assistance from therapist. Pt does require extra time and uses handrails.  Transfers Overall transfer level: Needs assistance Equipment used: None Transfers: Sit to/from Stand Sit to Stand: Supervision         General transfer comment: supervision for safety  Ambulation/Gait Ambulation/Gait assistance: Min guard Ambulation Distance (Feet): 150 Feet Assistive device: 1 person hand held  assist Gait Pattern/deviations: Step-through pattern;Decreased stride length Gait velocity: decreased   General Gait Details: Pt c/o increasing neck/back spasming during ambulation.   Stairs Stairs: Yes   Stair Management: One rail Left;Step to pattern;Forwards Number of Stairs: 2    Wheelchair Mobility    Modified Rankin (Stroke Patients Only)       Balance Overall balance assessment: Needs assistance Sitting-balance support: Feet supported;No upper extremity supported Sitting balance-Leahy Scale: Good Sitting balance - Comments: Pt able to sit EOB without UE support and shift weight.      Standing balance-Leahy Scale: Fair Standing balance comment: Pt is able to maintain balance without exernal support while standing, however use of hand-held assist for safety with weight shifting.                             Cognition Arousal/Alertness: Awake/alert Behavior During Therapy: WFL for tasks assessed/performed Overall Cognitive Status: Within Functional Limits for tasks assessed                                        Exercises      General Comments        Pertinent Vitals/Pain Pain Assessment: 0-10 Pain Score: 9  Pain Location: back  Pain Descriptors / Indicators: Spasm Pain Intervention(s): Limited activity within patient's tolerance;Patient requesting pain meds-RN notified;Monitored during session    Home Living  Prior Function            PT Goals (current goals can now be found in the care plan section) Progress towards PT goals: Progressing toward goals    Frequency    Min 3X/week      PT Plan Current plan remains appropriate    Co-evaluation              AM-PAC PT "6 Clicks" Daily Activity  Outcome Measure  Difficulty turning over in bed (including adjusting bedclothes, sheets and blankets)?: None Difficulty moving from lying on back to sitting on the side of the bed? : A  Little Difficulty sitting down on and standing up from a chair with arms (e.g., wheelchair, bedside commode, etc,.)?: A Little Help needed moving to and from a bed to chair (including a wheelchair)?: A Little Help needed walking in hospital room?: A Little Help needed climbing 3-5 steps with a railing? : A Little 6 Click Score: 19    End of Session Equipment Utilized During Treatment: Cervical collar;Gait belt Activity Tolerance: Patient limited by pain Patient left: in bed;with call bell/phone within reach Nurse Communication: Patient requests pain meds PT Visit Diagnosis: Other abnormalities of gait and mobility (R26.89)     Time: 7711-6579 PT Time Calculation (min) (ACUTE ONLY): 9 min  Charges:  $Gait Training: 8-22 mins                    G Codes:       Judee Clara, SPT   Judee Clara 09/30/2017, 4:02 PM

## 2017-09-30 NOTE — Social Work (Signed)
CSW spoke with Dr. Reynaldo Minium regarding inability to place pt due to criminal hx and prior substance use hx.  Dr. Reynaldo Minium aware and requested that care team complete pt antibiotics and dc pt when complete.  CSW notified RN case manager about care plan.   CSW continuing to follow as needed.  [place], [place] [facility] [phone]

## 2017-10-01 LAB — BASIC METABOLIC PANEL
ANION GAP: 9 (ref 5–15)
BUN: 6 mg/dL (ref 6–20)
CALCIUM: 9.1 mg/dL (ref 8.9–10.3)
CO2: 29 mmol/L (ref 22–32)
Chloride: 95 mmol/L — ABNORMAL LOW (ref 101–111)
Creatinine, Ser: 0.62 mg/dL (ref 0.44–1.00)
GFR calc Af Amer: >60 mL/min (ref >60)
GLUCOSE: 112 mg/dL — AB (ref 65–99)
Potassium: 3.7 mmol/L (ref 3.5–5.1)
Sodium: 133 mmol/L — ABNORMAL LOW (ref 135–145)

## 2017-10-01 LAB — MAGNESIUM: Magnesium: 2.1 mg/dL (ref 1.7–2.4)

## 2017-10-01 NOTE — Progress Notes (Signed)
Patient ID: [name], female   DOB: 1966/06/29, 51 y.o.   MRN: [id number]  PROGRESS NOTE    [name]  BMW:[id number] DOB: 08/23/1966 DOA: 09/11/2017 PCP: Marliss Coots, NP   Brief Narrative:  51 year old female with history of PTSD, depression, intravenous drug use, chronic back pain status post previous back surgery, ADD, bilateral ovarian cysts, PID, sciatica presented on 09/11/2017 with severe back and neck pain.  MRI of the T and L-spine revealed ventral epidural abscess.  She was found to have MSSA bacteremia.  She was put on IV antibiotics.  ID and neurosurgery were consulted.  Patient was transferred to [facility].  She developed urinary retention on 09/15/2017 for which she required emergent C3-C7 laminectomy for decompression of spinal cord and debridement of epidural abscess.  She had TEE on 09/20/2017 which was negative for vegetations.  ID recommends at least 6 weeks of IV antibiotics.   Assessment & Plan:   Principal Problem:   Severe sepsis ([place]) Active Problems:   Hypokalemia   Lymphadenopathy   Back pain   Hyponatremia   Right ovarian cyst   Hypotension   MSSA bacteremia   Abscess in epidural space of thoracic spine   Mediastinal mass   Obesity, Class III, BMI 40-49.9 (morbid obesity) (HCC)   Leucocytosis   Neck pain   Abscess in epidural space of cervical spine   Severe sepsis -Resolved.  Currently hemodynamically stable  MSSA bacteremia -Currently on cefazolin and rifampin as per ID recommendations.  ID recommends at least 6 weeks of antibiotics as per the last note from Dr. Johnnye Sima on 09/27/2017.  Patient will remain inpatient while on intravenous antibiotics. 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  Monitor intermittently -Patient was last seen by Dr. Lurline Del on 11/18 for R/O lymphoma -Thrombocytosis improving though extremely elevated -12/1 PBS; thrombocytosis and reactive appearing lymphocytes   Morbid obesity -Follow nutrition consult recommendations  Hypokalemia -Resolved  Hepatitis C -Outpatient follow-up with ID  Diarrhea -Resolved   DVT prophylaxis: Subcutaneous heparin Code Status: Full Family Communication: None at bedside Disposition Plan: To remain inpatient to complete intravenous antibiotics  Consultants:  Hematology/ Oncology ID Neurosurgery Cardiology   Procedures:  emergent C3-C7 laminectomy for decompression of spinal cord and debridement of epidural abscess on 09/15/2017  TEE on 09/20/2017: No evidence of vegetations  Echo on 09/12/2017 Study Conclusions  - Left ventricle: The cavity size was normal. Wall thickness was   normal. Systolic function was normal. The estimated ejection   fraction was in the range of 55% to 60%. Wall motion was normal;   there were no regional wall motion abnormalities. Left   ventricular diastolic function parameters were normal. - Mitral valve: There was mild regurgitation. - Pulmonary arteries: Systolic pressure was mildly increased. PA   peak pressure: 39 mm Hg (S).  Antimicrobials:  Nafcillin 11/19 -12/3 Rifampin 11/22 > Cefazolin 11/18 > 11/19; 12/3> Cefepime 09/11/2017 >09/12/2017 Vancomycin 11/17   Subjective: Patient seen and examined at bedside.  She complains of back spasms.  No overnight fever, nausea or vomiting.  Objective: Vitals:   09/30/17 1941 09/30/17 2328  10/01/17 0445 10/01/17 0741  BP: 129/76 139/86 128/88 129/81  Pulse: 72 73 68 72  Resp: 16 16  16   Temp: 98.2 F (36.8 C) 98.3 F (36.8 C) 98.1 F (36.7 C) 98.2 F (36.8 C)  TempSrc: Oral Oral Oral Oral  SpO2: 97% 97% 96% 96%  Weight:      Height:        Intake/Output Summary (Last 24 hours) at 10/01/2017 0806 Last data filed at 10/01/2017 0600 Gross per 24 hour  Intake 1380 ml  Output -  Net 1380 ml   Filed Weights   09/13/17 0730 09/14/17 0831 09/21/17 0400  Weight: 117.4 kg (258 lb 13.1 oz) 115.8 kg (255 lb 4.7 oz) 111.1 kg (244 lb 14.9 oz)    Examination:  General exam: Appears calm and comfortable  Respiratory system: Bilateral decreased breath sound at bases Cardiovascular system: S1 & S2 heard, controlled  gastrointestinal system: Abdomen is nondistended, soft and nontender. Normal bowel sounds heard. Extremities: No cyanosis, clubbing, edema    Data Reviewed: I have personally reviewed following labs and imaging studies  CBC: Recent Labs  Lab 09/25/17 0644 09/26/17 0500 09/27/17 0555  WBC 11.9* 10.2 9.0  HGB 10.8* 10.5* 10.5*  HCT 32.9* 32.4* 32.8*  MCV 92.9 92.6 93.4  PLT 972* 947* 725*   Basic Metabolic Panel: Recent Labs  Lab 09/25/17 0644 09/26/17 0500 09/27/17 0555 09/29/17 0749 10/01/17 0400  NA 135 134* 135 132* 133*  K 3.4* 3.3* 3.6 4.0 3.7  CL 98* 97* 98* 90* 95*  CO2 30 29 30 29 29   GLUCOSE 97 95 104* 107* 112*  BUN 5* 5* <5* <5* 6  CREATININE 0.65 0.56 0.56 0.61 0.62  CALCIUM 8.5* 8.5* 8.6* 8.9 9.1  MG 2.0 2.0 2.0 2.0 2.1   GFR: Estimated Creatinine Clearance: 108.6 mL/min (by C-G formula based on SCr of 0.62 mg/dL). Liver Function Tests: No results for input(s): AST, ALT, ALKPHOS, BILITOT, PROT, ALBUMIN in the last 168 hours. No results for input(s): LIPASE, AMYLASE in the last 168 hours. No results for input(s): AMMONIA in the last 168 hours. Coagulation Profile: No results for input(s): INR, PROTIME in the last 168  hours. Cardiac Enzymes: No results for input(s): CKTOTAL, CKMB, CKMBINDEX, TROPONINI in the last 168 hours. BNP (last 3 results) No results for input(s): PROBNP in the last 8760 hours. HbA1C: No results for input(s): HGBA1C in the last 72 hours. CBG: No results for input(s): GLUCAP in the last 168 hours. Lipid Profile: No results for input(s): CHOL, HDL, LDLCALC, TRIG, CHOLHDL, LDLDIRECT in the last 72 hours. Thyroid Function Tests: No results for input(s): TSH, T4TOTAL, FREET4, T3FREE, THYROIDAB in the last 72 hours. Anemia Panel: No results for input(s): VITAMINB12, FOLATE, FERRITIN, TIBC, IRON, RETICCTPCT in the last 72 hours. Sepsis Labs: No results for input(s): PROCALCITON, LATICACIDVEN in the last 168 hours.  No results found for this or any previous visit (from the past 240 hour(s)).       Radiology Studies: Ct Angio Chest Pe W Or Wo Contrast  Result Date: 09/30/2017 CLINICAL DATA:  51 y/o F; shortness of breath, PE suspected, high pretest probability. EXAM: CT ANGIOGRAPHY CHEST WITH CONTRAST TECHNIQUE: Multidetector CT imaging of the chest was performed using the standard protocol during bolus administration of intravenous contrast. Multiplanar CT image reconstructions and MIPs were obtained to evaluate the vascular anatomy. CONTRAST:  53 cc Isovue 370 COMPARISON:  09/11/2017 CT of the chest.  09/01/2017 thoracic spine MRI. FINDINGS: Cardiovascular: Satisfactory opacification of the pulmonary arteries to the segmental level. No evidence of pulmonary embolism. Normal heart size. No pericardial effusion. Mediastinum/Nodes: Prominent mediastinal lymph nodes are likely reactive. Normal thyroid gland. Normal thoracic esophagus. Right central venous catheter tip extends to the lower SVC. Lungs/Pleura: Small left pleural effusion. Upper Abdomen: No acute abnormality. Musculoskeletal: Paravertebral fluid collection predominantly on the right aspect of vertebral bodies and with a smaller  component anterior and left lateral to the vertebral bodies extends from the T3-4 level to the T8 level and demonstrates a faint rim of peripheral enhancement (series 5, image 50 and series 8, image 77). There are faint lucent changes within the T7-8 endplates which may represent discitis osteomyelitis. Review of the MIP images confirms the above findings. IMPRESSION: 1. No pulmonary embolus identified. 2. Paravertebral rim enhancing collection extending from T3-4 to the T8 level, greater on the right, compatible with abscess, is decreased in its craniocaudal extent from prior thoracic MRI. 3. Faint lucency within T7-8 anterior endplates may represent developing osteomyelitis. No gross bony destructive changes. 4. Small left pleural effusion. Electronically Signed   By: Kristine Garbe M.D.   On: 09/30/2017 22:15        Scheduled Meds: . Chlorhexidine Gluconate Cloth  6 each Topical Q2200  . DULoxetine  60 mg Oral Daily  . heparin injection (subcutaneous)  5,000 Units Subcutaneous Q8H  . oxyCODONE  10 mg Oral Q12H  . rifampin  300 mg Oral Q12H  . sodium chloride flush  10-40 mL Intracatheter Q12H   Continuous Infusions: .  ceFAZolin (ANCEF) IV 2 g (10/01/17 0500)     LOS: 20 days        Aline August, MD [facility] Pager [phone]  If 7PM-7AM, please contact night-coverage [email or web address] Password [email or web address] 10/01/2017, 8:06 AM

## 2017-10-01 NOTE — Progress Notes (Signed)
Physical Therapy Treatment Patient Details Name: [name] MRN: [id number] DOB: 29-Jul-1966 Today's Date: 10/01/2017    History of Present Illness 51 year old female w/ a history of IV drug use, hep C and chronic back pain status post previous back surgery who presented to the ED with severe back and neck pain.  An MRI of the T and L-spine revealed a ventral epidural abscess C2-t8. 1/21 Pt with emergent C3-7 lami and epidural abscess debridement    PT Comments    Pt pleasant and reports subscapular pain limiting function for the last several days. Pt able to tolerate limited gait with use of RW today and agreeable to OOB in chair with heating pad end of session. Continue to encourage daily mobility and ambulation with nursing .     Follow Up Recommendations  Supervision for mobility/OOB;Home health PT     Equipment Recommendations  Rolling walker with 5" wheels    Recommendations for Other Services       Precautions / Restrictions Precautions Precautions: Cervical Precaution Comments: Reviewed cervical precautions with pt Required Braces or Orthoses: Cervical Brace Cervical Brace: Hard collar;At all times    Mobility  Bed Mobility Overal bed mobility: Modified Independent Bed Mobility: Supine to Sit           General bed mobility comments: use of rail with mod I   Transfers Overall transfer level: Modified independent   Transfers: Sit to/from Stand Sit to Stand: Modified independent (Device/Increase time)            Ambulation/Gait Ambulation/Gait assistance: Supervision Ambulation Distance (Feet): 300 Feet Assistive device: Rolling walker (2 wheeled) Gait Pattern/deviations: Step-through pattern;Decreased stride length   Gait velocity interpretation: Below normal speed for age/gender General Gait Details: Pt with increased scapular pain today stating need for RW with gait today and able to perform limited gait due to pain   Stairs             Wheelchair Mobility    Modified Rankin (Stroke Patients Only)       Balance Overall balance assessment: Needs assistance   Sitting balance-Leahy Scale: Good       Standing balance-Leahy Scale: Good                              Cognition Arousal/Alertness: Awake/alert Behavior During Therapy: WFL for tasks assessed/performed Overall Cognitive Status: Within Functional Limits for tasks assessed                                        Exercises      General Comments        Pertinent Vitals/Pain Pain Score: 8  Pain Location: scapular Pain Descriptors / Indicators: Spasm;Aching Pain Intervention(s): Limited activity within patient's tolerance;Repositioned;Monitored during session;Heat applied    Home Living                      Prior Function            PT Goals (current goals can now be found in the care plan section) Progress towards PT goals: Progressing toward goals    Frequency    Min 2X/week      PT Plan Current plan remains appropriate;Frequency needs to be updated    Co-evaluation              AM-PAC  PT "6 Clicks" Daily Activity  Outcome Measure  Difficulty turning over in bed (including adjusting bedclothes, sheets and blankets)?: None Difficulty moving from lying on back to sitting on the side of the bed? : A Little Difficulty sitting down on and standing up from a chair with arms (e.g., wheelchair, bedside commode, etc,.)?: A Little Help needed moving to and from a bed to chair (including a wheelchair)?: A Little Help needed walking in hospital room?: A Little Help needed climbing 3-5 steps with a railing? : A Little 6 Click Score: 19    End of Session Equipment Utilized During Treatment: Cervical collar;Gait belt Activity Tolerance: Patient limited by pain Patient left: in chair;with call bell/phone within reach Nurse Communication: Mobility status PT Visit Diagnosis: Other abnormalities of  gait and mobility (R26.89)     Time: 2446-9507 PT Time Calculation (min) (ACUTE ONLY): 16 min  Charges:  $Gait Training: 8-22 mins                    G Codes:       Elwyn Reach, PT [phone]    [place] 10/01/2017, 12:24 PM

## 2017-10-02 LAB — CBC WITH DIFFERENTIAL/PLATELET
BASOS ABS: 0.1 10*3/uL (ref 0.0–0.1)
BASOS PCT: 2 %
EOS ABS: 0.7 10*3/uL (ref 0.0–0.7)
Eosinophils Relative: 9 %
HCT: 36.3 % (ref 36.0–46.0)
HEMOGLOBIN: 12 g/dL (ref 12.0–15.0)
Lymphocytes Relative: 29 %
Lymphs Abs: 2.2 10*3/uL (ref 0.7–4.0)
MCH: 30.5 pg (ref 26.0–34.0)
MCHC: 33.1 g/dL (ref 30.0–36.0)
MCV: 92.4 fL (ref 78.0–100.0)
Monocytes Absolute: 0.7 10*3/uL (ref 0.1–1.0)
Monocytes Relative: 10 %
NEUTROS PCT: 50 %
Neutro Abs: 3.8 10*3/uL (ref 1.7–7.7)
Platelets: 540 10*3/uL — ABNORMAL HIGH (ref 150–400)
RBC: 3.93 MIL/uL (ref 3.87–5.11)
RDW: 15.2 % (ref 11.5–15.5)
WBC: 7.6 10*3/uL (ref 4.0–10.5)

## 2017-10-02 LAB — BASIC METABOLIC PANEL
ANION GAP: 10 (ref 5–15)
BUN: 7 mg/dL (ref 6–20)
CALCIUM: 8.9 mg/dL (ref 8.9–10.3)
CO2: 27 mmol/L (ref 22–32)
Chloride: 97 mmol/L — ABNORMAL LOW (ref 101–111)
Creatinine, Ser: 0.61 mg/dL (ref 0.44–1.00)
Glucose, Bld: 110 mg/dL — ABNORMAL HIGH (ref 65–99)
Potassium: 3.9 mmol/L (ref 3.5–5.1)
Sodium: 134 mmol/L — ABNORMAL LOW (ref 135–145)

## 2017-10-02 LAB — MAGNESIUM: Magnesium: 2 mg/dL (ref 1.7–2.4)

## 2017-10-02 LAB — C-REACTIVE PROTEIN: CRP: 2.3 mg/dL — AB (ref <1.0)

## 2017-10-02 NOTE — Progress Notes (Signed)
Patient ID: [name], female   DOB: [birth year], 51 y.o.   MRN: [id number]  PROGRESS NOTE    MAVI UN  HDQ:[id number] DOB: [birth year] DOA: 09/11/2017 PCP: Marliss Coots, NP   Brief Narrative:  51 year old female with history of PTSD, depression, intravenous drug use, chronic back pain status post previous back surgery, ADD, bilateral ovarian cysts, PID, sciatica presented on 09/11/2017 with severe back and neck pain.  MRI of the T and L-spine revealed ventral epidural abscess.  She was found to have MSSA bacteremia.  She was put on IV antibiotics.  ID and neurosurgery were consulted.  Patient was transferred to [facility].  She developed urinary retention on 09/15/2017 for which she required emergent C3-C7 laminectomy for decompression of spinal cord and debridement of epidural abscess.  She had TEE on 09/20/2017 which was negative for vegetations.  ID recommends at least 6 weeks of IV antibiotics.   Assessment & Plan:   Principal Problem:   Severe sepsis ([place]) Active Problems:   Hypokalemia   Lymphadenopathy   Back pain   Hyponatremia   Right ovarian cyst   Hypotension   MSSA bacteremia   Abscess in epidural space of thoracic spine   Mediastinal mass   Obesity, Class III, BMI 40-49.9 (morbid obesity) (HCC)   Leucocytosis   Neck pain   Abscess in epidural space of cervical spine   Severe sepsis -Resolved.  Currently hemodynamically stable  MSSA bacteremia -Currently on cefazolin and rifampin as per ID recommendations.  ID recommends at least 6 weeks of antibiotics as per the last note from Dr. Johnnye Sima on 09/27/2017.  Patient will remain inpatient while on intravenous antibiotics. 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  Monitor intermittently -Patient was last seen by Dr. Lurline Del on 11/18 for R/O lymphoma -Thrombocytosis improving though extremely elevated -12/1 PBS; thrombocytosis and reactive appearing lymphocytes   Morbid obesity -Follow nutrition consult recommendations  Hypokalemia -Resolved  Hepatitis C -Outpatient follow-up with ID  Diarrhea -Resolved   DVT prophylaxis: Subcutaneous heparin Code Status: Full Family Communication: None at bedside Disposition Plan: To remain inpatient to complete intravenous antibiotics  Consultants:  Hematology/ Oncology ID Neurosurgery Cardiology   Procedures:  emergent C3-C7 laminectomy for decompression of spinal cord and debridement of epidural abscess on 09/15/2017  TEE on 09/20/2017: No evidence of vegetations  Echo on 09/12/2017 Study Conclusions  - Left ventricle: The cavity size was normal. Wall thickness was   normal. Systolic function was normal. The estimated ejection   fraction was in the range of 55% to 60%. Wall motion was normal;   there were no regional wall motion abnormalities. Left   ventricular diastolic function parameters were normal. - Mitral valve: There was mild regurgitation. - Pulmonary arteries: Systolic pressure was mildly increased. PA   peak pressure: 39 mm Hg (S).  Antimicrobials:  Nafcillin 11/19 -12/3 Rifampin 11/22 > Cefazolin 11/18 > 11/19; 12/3> Cefepime 09/11/2017 >09/12/2017 Vancomycin 11/17   Subjective: Patient seen and examined at bedside.  She complains of back spasms.  No overnight fever, nausea or vomiting.  Objective: Vitals:   10/01/17 1918 10/02/17 0400  10/02/17 0734 10/02/17 0800  BP: 129/78 135/66  137/89  Pulse: 86 88    Resp: 18     Temp: 98.2 F (36.8 C) 98.1 F (36.7 C) 98.1 F (36.7 C)   TempSrc: Oral Oral Oral   SpO2: 96%     Weight:      Height:        Intake/Output Summary (Last 24 hours) at 10/02/2017 1137 Last data filed at 10/02/2017 0617 Gross per 24 hour  Intake 1260 ml  Output -  Net 1260 ml   Filed Weights   09/13/17 0730 09/14/17 0831 09/21/17 0400  Weight: 117.4 kg (258 lb 13.1 oz) 115.8 kg (255 lb 4.7 oz) 111.1 kg (244 lb 14.9 oz)    Examination:  General exam: No acute distress Respiratory system: Bilateral decreased breath sound at bases Cardiovascular system: S1 & S2 heard, controlled  gastrointestinal system: Abdomen is nondistended, soft and nontender. Normal bowel sounds heard. Extremities: No cyanosis, clubbing, edema    Data Reviewed: I have personally reviewed following labs and imaging studies  CBC: Recent Labs  Lab 09/26/17 0500 09/27/17 0555 10/02/17 0552  WBC 10.2 9.0 7.6  NEUTROABS  --   --  3.8  HGB 10.5* 10.5* 12.0  HCT 32.4* 32.8* 36.3  MCV 92.6 93.4 92.4  PLT 947* 848* 951*   Basic Metabolic Panel: Recent Labs  Lab 09/26/17 0500 09/27/17 0555 09/29/17 0749 10/01/17 0400 10/02/17 0552  NA 134* 135 132* 133* 134*  K 3.3* 3.6 4.0 3.7 3.9  CL 97* 98* 90* 95* 97*  CO2 29 30 29 29 27   GLUCOSE 95 104* 107* 112* 110*  BUN 5* <5* <5* 6 7  CREATININE 0.56 0.56 0.61 0.62 0.61  CALCIUM 8.5* 8.6* 8.9 9.1 8.9  MG 2.0 2.0 2.0 2.1 2.0   GFR: Estimated Creatinine Clearance: 108.6 mL/min (by C-G formula based on SCr of 0.61 mg/dL). Liver Function Tests: No results for input(s): AST, ALT, ALKPHOS, BILITOT, PROT, ALBUMIN in the last 168 hours. No results for input(s): LIPASE, AMYLASE in the last 168 hours. No results for input(s): AMMONIA in the last 168 hours. Coagulation Profile: No results for input(s): INR, PROTIME in the last 168 hours. Cardiac Enzymes: No results for  input(s): CKTOTAL, CKMB, CKMBINDEX, TROPONINI in the last 168 hours. BNP (last 3 results) No results for input(s): PROBNP in the last 8760 hours. HbA1C: No results for input(s): HGBA1C in the last 72 hours. CBG: No results for input(s): GLUCAP in the last 168 hours. Lipid Profile: No results for input(s): CHOL, HDL, LDLCALC, TRIG, CHOLHDL, LDLDIRECT in the last 72 hours. Thyroid Function Tests: No results for input(s): TSH, T4TOTAL, FREET4, T3FREE, THYROIDAB in the last 72 hours. Anemia Panel: No results for input(s): VITAMINB12, FOLATE, FERRITIN, TIBC, IRON, RETICCTPCT in the last 72 hours. Sepsis Labs: No results for input(s): PROCALCITON, LATICACIDVEN in the last 168 hours.  No results found for this or any previous visit (from the past 240 hour(s)).       Radiology Studies: Ct Angio Chest Pe W Or Wo Contrast  Result Date: 09/30/2017 CLINICAL DATA:  51 y/o F; shortness of breath, PE suspected, high pretest probability. EXAM: CT ANGIOGRAPHY CHEST WITH CONTRAST TECHNIQUE: Multidetector CT imaging of the chest was performed using the standard protocol during bolus administration of intravenous contrast. Multiplanar CT image reconstructions and MIPs were obtained to evaluate the vascular anatomy. CONTRAST:  53 cc Isovue 370 COMPARISON:  09/11/2017  CT of the chest. 09/01/2017 thoracic spine MRI. FINDINGS: Cardiovascular: Satisfactory opacification of the pulmonary arteries to the segmental level. No evidence of pulmonary embolism. Normal heart size. No pericardial effusion. Mediastinum/Nodes: Prominent mediastinal lymph nodes are likely reactive. Normal thyroid gland. Normal thoracic esophagus. Right central venous catheter tip extends to the lower SVC. Lungs/Pleura: Small left pleural effusion. Upper Abdomen: No acute abnormality. Musculoskeletal: Paravertebral fluid collection predominantly on the right aspect of vertebral bodies and with a smaller component anterior and left lateral to the  vertebral bodies extends from the T3-4 level to the T8 level and demonstrates a faint rim of peripheral enhancement (series 5, image 50 and series 8, image 77). There are faint lucent changes within the T7-8 endplates which may represent discitis osteomyelitis. Review of the MIP images confirms the above findings. IMPRESSION: 1. No pulmonary embolus identified. 2. Paravertebral rim enhancing collection extending from T3-4 to the T8 level, greater on the right, compatible with abscess, is decreased in its craniocaudal extent from prior thoracic MRI. 3. Faint lucency within T7-8 anterior endplates may represent developing osteomyelitis. No gross bony destructive changes. 4. Small left pleural effusion. Electronically Signed   By: Kristine Garbe M.D.   On: 09/30/2017 22:15        Scheduled Meds: . Chlorhexidine Gluconate Cloth  6 each Topical Q2200  . DULoxetine  60 mg Oral Daily  . heparin injection (subcutaneous)  5,000 Units Subcutaneous Q8H  . oxyCODONE  10 mg Oral Q12H  . rifampin  300 mg Oral Q12H  . sodium chloride flush  10-40 mL Intracatheter Q12H   Continuous Infusions: .  ceFAZolin (ANCEF) IV 2 g (10/02/17 0617)     LOS: 21 days        Aline August, MD [facility] Pager [phone]  If 7PM-7AM, please contact night-coverage [email or web address] Password [email or web address] 10/02/2017, 11:37 AM

## 2017-10-03 LAB — BASIC METABOLIC PANEL
ANION GAP: 10 (ref 5–15)
BUN: 6 mg/dL (ref 6–20)
CALCIUM: 9 mg/dL (ref 8.9–10.3)
CO2: 27 mmol/L (ref 22–32)
Chloride: 95 mmol/L — ABNORMAL LOW (ref 101–111)
Creatinine, Ser: 0.73 mg/dL (ref 0.44–1.00)
GFR calc non Af Amer: >60 mL/min (ref >60)
GLUCOSE: 161 mg/dL — AB (ref 65–99)
POTASSIUM: 3.7 mmol/L (ref 3.5–5.1)
Sodium: 132 mmol/L — ABNORMAL LOW (ref 135–145)

## 2017-10-03 LAB — MAGNESIUM: Magnesium: 1.8 mg/dL (ref 1.7–2.4)

## 2017-10-03 NOTE — Progress Notes (Signed)
Patient ID: [name], female   DOB: 04/26/1966, 51 y.o.   MRN: [id number]  PROGRESS NOTE    [name]  VEL:[id number] DOB: 1965/11/17 DOA: 09/11/2017 PCP: Marliss Coots, NP   Brief Narrative:  51 year old female with history of PTSD, depression, intravenous drug use, chronic back pain status post previous back surgery, ADD, bilateral ovarian cysts, PID, sciatica presented on 09/11/2017 with severe back and neck pain.  MRI of the T and L-spine revealed ventral epidural abscess.  She was found to have MSSA bacteremia.  She was put on IV antibiotics.  ID and neurosurgery were consulted.  Patient was transferred to [facility].  She developed urinary retention on 09/15/2017 for which she required emergent C3-C7 laminectomy for decompression of spinal cord and debridement of epidural abscess.  She had TEE on 09/20/2017 which was negative for vegetations.  ID recommends at least 6 weeks of IV antibiotics.   Assessment & Plan:   Principal Problem:   Severe sepsis ([place]) Active Problems:   Hypokalemia   Lymphadenopathy   Back pain   Hyponatremia   Right ovarian cyst   Hypotension   MSSA bacteremia   Abscess in epidural space of thoracic spine   Mediastinal mass   Obesity, Class III, BMI 40-49.9 (morbid obesity) (HCC)   Leucocytosis   Neck pain   Abscess in epidural space of cervical spine   Severe sepsis -Resolved.  Currently hemodynamically stable  MSSA bacteremia -Currently on cefazolin and rifampin as per ID recommendations.  ID recommends at least 6 weeks of antibiotics as per the last note from Dr. Johnnye Sima on 09/27/2017.  Patient will remain inpatient while on intravenous antibiotics. 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  Monitor intermittently -Patient was last seen by Dr. Lurline Del on 11/18 for R/O lymphoma -Thrombocytosis improving  -12/1 PBS; thrombocytosis and reactive appearing lymphocytes   Morbid obesity -Follow nutrition consult recommendations  Hypokalemia -Resolved  Hepatitis C -Outpatient follow-up with ID  Diarrhea -Resolved   DVT prophylaxis: Subcutaneous heparin Code Status: Full Family Communication: None at bedside Disposition Plan: To remain inpatient to complete intravenous antibiotics  Consultants:  Hematology/ Oncology ID Neurosurgery Cardiology   Procedures:  emergent C3-C7 laminectomy for decompression of spinal cord and debridement of epidural abscess on 09/15/2017  TEE on 09/20/2017: No evidence of vegetations  Echo on 09/12/2017 Study Conclusions  - Left ventricle: The cavity size was normal. Wall thickness was   normal. Systolic function was normal. The estimated ejection   fraction was in the range of 55% to 60%. Wall motion was normal;   there were no regional wall motion abnormalities. Left   ventricular diastolic function parameters were normal. - Mitral valve: There was mild regurgitation. - Pulmonary arteries: Systolic pressure was mildly increased. PA   peak pressure: 39 mm Hg (S).  Antimicrobials:  Nafcillin 11/19 -12/3 Rifampin 11/22 > Cefazolin 11/18 > 11/19; 12/3> Cefepime 09/11/2017 >09/12/2017 Vancomycin 11/17   Subjective: Patient seen and examined at bedside.  She is sleepy, hardly wakes up and does not answer any questions.  No  overnight fever, nausea or vomiting. Objective: Vitals:   10/02/17 1534 10/02/17  1905 10/03/17 0356 10/03/17 0820  BP: (!) 141/84 130/84 (!) 154/92 (!) 131/96  Pulse: 81 79  70  Resp: 17 19  18   Temp: 98.1 F (36.7 C) 98.3 F (36.8 C) 97.8 F (36.6 C) 97.8 F (36.6 C)  TempSrc: Oral Oral Oral Oral  SpO2: 96% 98% 96% 97%  Weight:      Height:        Intake/Output Summary (Last 24 hours) at 10/03/2017 1039 Last data filed at 10/03/2017 0903 Gross per 24 hour  Intake 210 ml  Output -  Net 210 ml   Filed Weights   09/13/17 0730 09/14/17 0831 09/21/17 0400  Weight: 117.4 kg (258 lb 13.1 oz) 115.8 kg (255 lb 4.7 oz) 111.1 kg (244 lb 14.9 oz)    Examination:  General exam: No acute distress.  Very sleepy Respiratory system: Bilateral decreased breath sound at bases Cardiovascular system: S1 & S2 heard, controlled    Data Reviewed: I have personally reviewed following labs and imaging studies  CBC: Recent Labs  Lab 09/27/17 0555 10/02/17 0552  WBC 9.0 7.6  NEUTROABS  --  3.8  HGB 10.5* 12.0  HCT 32.8* 36.3  MCV 93.4 92.4  PLT 848* 644*   Basic Metabolic Panel: Recent Labs  Lab 09/27/17 0555 09/29/17 0749 10/01/17 0400 10/02/17 0552 10/03/17 0911  NA 135 132* 133* 134* 132*  K 3.6 4.0 3.7 3.9 3.7  CL 98* 90* 95* 97* 95*  CO2 30 29 29 27 27   GLUCOSE 104* 107* 112* 110* 161*  BUN <5* <5* 6 7 6   CREATININE 0.56 0.61 0.62 0.61 0.73  CALCIUM 8.6* 8.9 9.1 8.9 9.0  MG 2.0 2.0 2.1 2.0 1.8   GFR: Estimated Creatinine Clearance: 108.6 mL/min (by C-G formula based on SCr of 0.73 mg/dL). Liver Function Tests: No results for input(s): AST, ALT, ALKPHOS, BILITOT, PROT, ALBUMIN in the last 168 hours. No results for input(s): LIPASE, AMYLASE in the last 168 hours. No results for input(s): AMMONIA in the last 168 hours. Coagulation Profile: No results for input(s): INR, PROTIME in the last 168 hours. Cardiac Enzymes: No results for input(s): CKTOTAL, CKMB, CKMBINDEX, TROPONINI in the last 168 hours. BNP (last 3 results) No results for input(s):  PROBNP in the last 8760 hours. HbA1C: No results for input(s): HGBA1C in the last 72 hours. CBG: No results for input(s): GLUCAP in the last 168 hours. Lipid Profile: No results for input(s): CHOL, HDL, LDLCALC, TRIG, CHOLHDL, LDLDIRECT in the last 72 hours. Thyroid Function Tests: No results for input(s): TSH, T4TOTAL, FREET4, T3FREE, THYROIDAB in the last 72 hours. Anemia Panel: No results for input(s): VITAMINB12, FOLATE, FERRITIN, TIBC, IRON, RETICCTPCT in the last 72 hours. Sepsis Labs: No results for input(s): PROCALCITON, LATICACIDVEN in the last 168 hours.  No results found for this or any previous visit (from the past 240 hour(s)).       Radiology Studies: No results found.      Scheduled Meds: . Chlorhexidine Gluconate Cloth  6 each Topical Q2200  . DULoxetine  60 mg Oral Daily  . heparin injection (subcutaneous)  5,000 Units Subcutaneous Q8H  . oxyCODONE  10 mg Oral Q12H  . rifampin  300 mg Oral Q12H  . sodium chloride flush  10-40 mL Intracatheter Q12H   Continuous Infusions: .  ceFAZolin (ANCEF) IV Stopped (10/03/17 0700)     LOS: 22 days  Aline August, MD [facility] Pager [phone]  If 7PM-7AM, please contact night-coverage [email or web address] Password [email or web address] 10/03/2017, 10:39 AM

## 2017-10-04 LAB — BASIC METABOLIC PANEL
ANION GAP: 9 (ref 5–15)
BUN: 8 mg/dL (ref 6–20)
CHLORIDE: 95 mmol/L — AB (ref 101–111)
CO2: 29 mmol/L (ref 22–32)
Calcium: 9.2 mg/dL (ref 8.9–10.3)
Creatinine, Ser: 0.6 mg/dL (ref 0.44–1.00)
GFR calc Af Amer: >60 mL/min (ref >60)
GFR calc non Af Amer: >60 mL/min (ref >60)
GLUCOSE: 104 mg/dL — AB (ref 65–99)
POTASSIUM: 4 mmol/L (ref 3.5–5.1)
Sodium: 133 mmol/L — ABNORMAL LOW (ref 135–145)

## 2017-10-04 LAB — MAGNESIUM: Magnesium: 1.9 mg/dL (ref 1.7–2.4)

## 2017-10-04 MED ORDER — SENNOSIDES-DOCUSATE SODIUM 8.6-50 MG PO TABS
1.0000 | ORAL_TABLET | Freq: Two times a day (BID) | ORAL | Status: DC
  Administered 2017-10-04 – 2017-11-14 (×83): 1 via ORAL
  Filled 2017-10-04 (×83): qty 1

## 2017-10-04 MED ORDER — POLYETHYLENE GLYCOL 3350 17 G PO PACK: 17.0000 g | PACK | Freq: Every day | ORAL | Status: DC | PRN

## 2017-10-04 NOTE — Progress Notes (Signed)
Called the on-call neurosurgeon to take a look at the knot area to the right of her incision per the request of Dr. Starla Link. Dr. Ronnald Ramp stopped by and said it could be a little fluid and he wasn't concerned with it. He gave me verbal orders to remove her staples.

## 2017-10-04 NOTE — Progress Notes (Signed)
Patient ID: [name], female   DOB: August 15, 1966, 51 y.o.   MRN: [id number]  PROGRESS NOTE    [name]  [id number] DOB: [birth year] DOA: 09/11/2017 PCP: Marliss Coots, NP   Brief Narrative:  51 year old female with history of PTSD, depression, intravenous drug use, chronic back pain status post previous back surgery, ADD, bilateral ovarian cysts, PID, sciatica presented on 09/11/2017 with severe back and neck pain.  MRI of the T and L-spine revealed ventral epidural abscess.  She was found to have MSSA bacteremia.  She was put on IV antibiotics.  ID and neurosurgery were consulted.  Patient was transferred to [facility].  She developed urinary retention on 09/15/2017 for which she required emergent C3-C7 laminectomy for decompression of spinal cord and debridement of epidural abscess.  She had TEE on 09/20/2017 which was negative for vegetations.  ID recommends at least 6 weeks of IV antibiotics.   Assessment & Plan:   Principal Problem:   Severe sepsis ([place]) Active Problems:   Hypokalemia   Lymphadenopathy   Back pain   Hyponatremia   Right ovarian cyst   Hypotension   MSSA bacteremia   Abscess in epidural space of thoracic spine   Mediastinal mass   Obesity, Class III, BMI 40-49.9 (morbid obesity) (HCC)   Leucocytosis   Neck pain   Abscess in epidural space of cervical spine   Severe sepsis -Resolved.  Currently hemodynamically stable  MSSA bacteremia -Currently on cefazolin and rifampin as per ID recommendations.  ID recommends at least 6 weeks of antibiotics as per the last note from Dr. Johnnye Sima on 09/27/2017.  Patient will remain inpatient while on intravenous antibiotics. 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  Will ask neurosurgery to follow-up on the surgical site.  Polysubstance abuse/intravenous drug use -Patient cannot be discharged with IV access  Chronic pain syndrome -Continue with Cymbalta, methocarbamol, OxyContin and oxycodone immediate release -Avoid intravenous narcotics  Thrombocytosis -Probably reactive.  Monitor intermittently -Patient was last seen by Dr. Lurline Del on 11/18 for R/O lymphoma -Thrombocytosis improving  -12/1 PBS; thrombocytosis and reactive appearing lymphocytes   Morbid obesity -Follow nutrition consult recommendations  Hypokalemia -Resolved  Hepatitis C -Outpatient follow-up with ID  Diarrhea -Resolved   DVT prophylaxis: Subcutaneous heparin Code Status: Full Family Communication: None at bedside Disposition Plan: To remain inpatient to complete intravenous antibiotics  Consultants:  Hematology/ Oncology ID Neurosurgery Cardiology   Procedures:  emergent C3-C7 laminectomy for decompression of spinal cord and debridement of epidural abscess on 09/15/2017  TEE on 09/20/2017: No evidence of vegetations  Echo on 09/12/2017 Study Conclusions  - Left ventricle: The cavity size was normal. Wall thickness was   normal. Systolic function was normal. The estimated ejection   fraction was in the range of 55% to 60%. Wall motion was normal;   there were no regional wall motion abnormalities. Left   ventricular diastolic function parameters were normal. - Mitral valve: There was mild regurgitation. - Pulmonary arteries: Systolic pressure was mildly increased. PA   peak pressure: 39 mm Hg (S).  Antimicrobials:  Nafcillin 11/19 -12/3 Rifampin 11/22 > Cefazolin 11/18 > 11/19; 12/3> Cefepime 09/11/2017 >09/12/2017 Vancomycin 11/17   Subjective: Patient seen and examined at bedside.  She is more awake  this morning.  No overnight fever, nausea or  vomiting.  Objective: Vitals:   10/03/17 2113 10/04/17 0400 10/04/17 0634 10/04/17 0934  BP: 128/81 (!) 139/93 (!) 139/93 132/83  Pulse: 71 75 75 80  Resp:    16  Temp: 98.2 F (36.8 C) 98.5 F (36.9 C)  97.9 F (36.6 C)  TempSrc: Oral Oral  Oral  SpO2: 98% 97% 97% 96%  Weight:      Height:        Intake/Output Summary (Last 24 hours) at 10/04/2017 1104 Last data filed at 10/04/2017 0941 Gross per 24 hour  Intake 660 ml  Output -  Net 660 ml   Filed Weights   09/13/17 0730 09/14/17 0831 09/21/17 0400  Weight: 117.4 kg (258 lb 13.1 oz) 115.8 kg (255 lb 4.7 oz) 111.1 kg (244 lb 14.9 oz)    Examination:  General exam: No acute distress.   Respiratory system: Bilateral decreased breath sound at bases Cardiovascular system: S1 & S2 heard, controlled    Data Reviewed: I have personally reviewed following labs and imaging studies  CBC: Recent Labs  Lab 10/02/17 0552  WBC 7.6  NEUTROABS 3.8  HGB 12.0  HCT 36.3  MCV 92.4  PLT 573*   Basic Metabolic Panel: Recent Labs  Lab 09/29/17 0749 10/01/17 0400 10/02/17 0552 10/03/17 0911 10/04/17 0539  NA 132* 133* 134* 132* 133*  K 4.0 3.7 3.9 3.7 4.0  CL 90* 95* 97* 95* 95*  CO2 29 29 27 27 29   GLUCOSE 107* 112* 110* 161* 104*  BUN <5* 6 7 6 8   CREATININE 0.61 0.62 0.61 0.73 0.60  CALCIUM 8.9 9.1 8.9 9.0 9.2  MG 2.0 2.1 2.0 1.8 1.9   GFR: Estimated Creatinine Clearance: 108.6 mL/min (by C-G formula based on SCr of 0.6 mg/dL). Liver Function Tests: No results for input(s): AST, ALT, ALKPHOS, BILITOT, PROT, ALBUMIN in the last 168 hours. No results for input(s): LIPASE, AMYLASE in the last 168 hours. No results for input(s): AMMONIA in the last 168 hours. Coagulation Profile: No results for input(s): INR, PROTIME in the last 168 hours. Cardiac Enzymes: No results for input(s): CKTOTAL, CKMB, CKMBINDEX, TROPONINI in the last 168 hours. BNP (last 3 results) No results for input(s): PROBNP in the last 8760  hours. HbA1C: No results for input(s): HGBA1C in the last 72 hours. CBG: No results for input(s): GLUCAP in the last 168 hours. Lipid Profile: No results for input(s): CHOL, HDL, LDLCALC, TRIG, CHOLHDL, LDLDIRECT in the last 72 hours. Thyroid Function Tests: No results for input(s): TSH, T4TOTAL, FREET4, T3FREE, THYROIDAB in the last 72 hours. Anemia Panel: No results for input(s): VITAMINB12, FOLATE, FERRITIN, TIBC, IRON, RETICCTPCT in the last 72 hours. Sepsis Labs: No results for input(s): PROCALCITON, LATICACIDVEN in the last 168 hours.  No results found for this or any previous visit (from the past 240 hour(s)).       Radiology Studies: No results found.      Scheduled Meds: . Chlorhexidine Gluconate Cloth  6 each Topical Q2200  . DULoxetine  60 mg Oral Daily  . heparin injection (subcutaneous)  5,000 Units Subcutaneous Q8H  . oxyCODONE  10 mg Oral Q12H  . rifampin  300 mg Oral Q12H  . sodium chloride flush  10-40 mL Intracatheter Q12H   Continuous Infusions: .  ceFAZolin (ANCEF) IV Stopped (10/04/17 2202)     LOS: 23 days        Aline August, MD [facility] Pager [phone]  If 7PM-7AM, please contact night-coverage [email or web address] Password [email or web address] 10/04/2017, 11:04 AM

## 2017-10-05 LAB — CBC WITH DIFFERENTIAL/PLATELET
Basophils Absolute: 0.1 10*3/uL (ref 0.0–0.1)
Basophils Relative: 2 %
EOS ABS: 0.6 10*3/uL (ref 0.0–0.7)
Eosinophils Relative: 7 %
HEMATOCRIT: 37.2 % (ref 36.0–46.0)
HEMOGLOBIN: 11.9 g/dL — AB (ref 12.0–15.0)
LYMPHS ABS: 2.6 10*3/uL (ref 0.7–4.0)
Lymphocytes Relative: 31 %
MCH: 29.8 pg (ref 26.0–34.0)
MCHC: 32 g/dL (ref 30.0–36.0)
MCV: 93.2 fL (ref 78.0–100.0)
MONOS PCT: 9 %
Monocytes Absolute: 0.8 10*3/uL (ref 0.1–1.0)
NEUTROS PCT: 51 %
Neutro Abs: 4.3 10*3/uL (ref 1.7–7.7)
Platelets: 505 10*3/uL — ABNORMAL HIGH (ref 150–400)
RBC: 3.99 MIL/uL (ref 3.87–5.11)
RDW: 15.4 % (ref 11.5–15.5)
WBC: 8.4 10*3/uL (ref 4.0–10.5)

## 2017-10-05 LAB — COMPREHENSIVE METABOLIC PANEL
ALK PHOS: 193 U/L — AB (ref 38–126)
ALT: 20 U/L (ref 14–54)
ANION GAP: 9 (ref 5–15)
AST: 40 U/L (ref 15–41)
Albumin: 2.8 g/dL — ABNORMAL LOW (ref 3.5–5.0)
BILIRUBIN TOTAL: 1.3 mg/dL — AB (ref 0.3–1.2)
BUN: 6 mg/dL (ref 6–20)
CALCIUM: 9.3 mg/dL (ref 8.9–10.3)
CO2: 29 mmol/L (ref 22–32)
Chloride: 95 mmol/L — ABNORMAL LOW (ref 101–111)
Creatinine, Ser: 0.65 mg/dL (ref 0.44–1.00)
GFR calc non Af Amer: >60 mL/min (ref >60)
Glucose, Bld: 105 mg/dL — ABNORMAL HIGH (ref 65–99)
Potassium: 3.8 mmol/L (ref 3.5–5.1)
Sodium: 133 mmol/L — ABNORMAL LOW (ref 135–145)
TOTAL PROTEIN: 8.8 g/dL — AB (ref 6.5–8.1)

## 2017-10-05 LAB — MAGNESIUM: Magnesium: 1.9 mg/dL (ref 1.7–2.4)

## 2017-10-05 LAB — C-REACTIVE PROTEIN: CRP: 1.8 mg/dL — AB (ref <1.0)

## 2017-10-05 NOTE — Progress Notes (Addendum)
Physical Therapy Treatment Patient Details Name: [name] MRN: [id number] DOB: August 23, 1966 Today's Date: 10/05/2017    History of Present Illness 51 year old female w/ a history of IV drug use, hep C and chronic back pain status post previous back surgery who presented to the ED with severe back and neck pain.  An MRI of the T and L-spine revealed a ventral epidural abscess C2-t8. 1/21 Pt with emergent C3-7 lami and epidural abscess debridement    PT Comments    Patient progression well with mobility. Ambulated without assistive device. No difficulty. Supervision for safety with line management.  Current POC remains appropriate. Patient will likely reach PT goals within next few sessions.  Follow Up Recommendations  Supervision for mobility/OOB  Equipment Recommendations  None recommended by PT    Recommendations for Other Services       Precautions / Restrictions Precautions Precautions: Cervical Precaution Comments: Reviewed cervical precautions with pt Required Braces or Orthoses: Cervical Brace Cervical Brace: Hard collar;At all times Restrictions Weight Bearing Restrictions: No    Mobility  Bed Mobility Overal bed mobility: Modified Independent Bed Mobility: Supine to Sit Rolling: Modified independent (Device/Increase time) Sidelying to sit: Modified independent (Device/Increase time) Supine to sit: Modified independent (Device/Increase time);HOB elevated Sit to supine: Modified independent (Device/Increase time) Sit to sidelying: Modified independent (Device/Increase time) General bed mobility comments: use of rail with mod I   Transfers Overall transfer level: Modified independent Equipment used: None Transfers: Sit to/from Stand Sit to Stand: Modified independent (Device/Increase time)         General transfer comment: supervision for safety  Ambulation/Gait Ambulation/Gait assistance: Supervision Ambulation Distance (Feet): 520 Feet Assistive  device: None Gait Pattern/deviations: Step-through pattern;Decreased stride length     General Gait Details: steady with ambulation   Stairs            Wheelchair Mobility    Modified Rankin (Stroke Patients Only)       Balance Overall balance assessment: Needs assistance Sitting-balance support: Feet supported;No upper extremity supported Sitting balance-Leahy Scale: Good     Standing balance support: No upper extremity supported;During functional activity Standing balance-Leahy Scale: Good                              Cognition Arousal/Alertness: Awake/alert Behavior During Therapy: WFL for tasks assessed/performed Overall Cognitive Status: Within Functional Limits for tasks assessed                                        Exercises Other Exercises Other Exercises: Reviewed theraputty excercises. Had pt place 6 paper clips in theraputty and then pull them out of putty to increase finger/hand strength. Pt demonstrating understanding    General Comments        Pertinent Vitals/Pain Pain Assessment: 0-10 Faces Pain Scale: Hurts little more Pain Location: upper back Pain Descriptors / Indicators: Aching Pain Intervention(s): Monitored during session    Home Living                      Prior Function            PT Goals (current goals can now be found in the care plan section) Acute Rehab PT Goals Patient Stated Goal: get better and reduce pain PT Goal Formulation: With patient Time For Goal Achievement: 10/01/17 Potential to Achieve Goals:  Good Progress towards PT goals: Progressing toward goals    Frequency    Min 2X/week      PT Plan Current plan remains appropriate;Frequency needs to be updated    Co-evaluation              AM-PAC PT "6 Clicks" Daily Activity  Outcome Measure  Difficulty turning over in bed (including adjusting bedclothes, sheets and blankets)?: None Difficulty moving from  lying on back to sitting on the side of the bed? : None Difficulty sitting down on and standing up from a chair with arms (e.g., wheelchair, bedside commode, etc,.)?: None Help needed moving to and from a bed to chair (including a wheelchair)?: None Help needed walking in hospital room?: None Help needed climbing 3-5 steps with a railing? : A Little 6 Click Score: 23    End of Session Equipment Utilized During Treatment: Cervical collar Activity Tolerance: Patient limited by pain Patient left: in bed;with call bell/phone within reach;with nursing/sitter in room Nurse Communication: Mobility status PT Visit Diagnosis: Other abnormalities of gait and mobility (R26.89) Pain - part of body: (neck)     Time: 9357-0177 PT Time Calculation (min) (ACUTE ONLY): 19 min  Charges:  $Gait Training: 8-22 mins                    G Codes:       Alben Deeds, PT DPT  Board Certified Neurologic Specialist [place] 10/05/2017, 3:57 PM

## 2017-10-05 NOTE — Progress Notes (Signed)
Occupational Therapy Treatment Patient Details Name: [name] MRN: [id number] DOB: [birth year] Today's Date: 10/05/2017    History of present illness 51 year old female w/ a history of IV drug use, hep C and chronic back pain status post previous back surgery who presented to the ED with severe back and neck pain.  An MRI of the T and L-spine revealed a ventral epidural abscess C2-t8. 1/21 Pt with emergent C3-7 lami and epidural abscess debridement   OT comments  Pt progressing towards established OT goals. Providing eduction on safe tub transfer techniques in preparation for dc home; pt demonstrating understanding and performed simulated transfer with supervision. Provided education on cervical collar management for donning/doffing, washing, and switching padding; pt verbalized understanding. Update recommended frequency and DME due to pt's increased functional performance. Will continue to follow acutely and feel pt will be ready for DC from acute OT soon.    Follow Up Recommendations  DC plan and follow up therapy as arranged by surgeon;Supervision - Intermittent    Equipment Recommendations  None recommended by OT    Recommendations for Other Services      Precautions / Restrictions Precautions Precautions: Cervical Precaution Comments: Reviewed cervical precautions with pt Required Braces or Orthoses: Cervical Brace Cervical Brace: Hard collar;At all times Restrictions Weight Bearing Restrictions: No       Mobility Bed Mobility Overal bed mobility: Modified Independent Bed Mobility: Supine to Sit Rolling: Modified independent (Device/Increase time) Sidelying to sit: Modified independent (Device/Increase time) Supine to sit: Modified independent (Device/Increase time);HOB elevated Sit to supine: Modified independent (Device/Increase time) Sit to sidelying: Modified independent (Device/Increase time) General bed mobility comments: Increased  time  Transfers Overall transfer level: Modified independent Equipment used: None Transfers: Sit to/from Stand Sit to Stand: Modified independent (Device/Increase time)         General transfer comment: supervision for safety    Balance Overall balance assessment: Needs assistance Sitting-balance support: Feet supported;No upper extremity supported Sitting balance-Leahy Scale: Good     Standing balance support: No upper extremity supported;During functional activity Standing balance-Leahy Scale: Good                             ADL either performed or assessed with clinical judgement   ADL Overall ADL's : Needs assistance/impaired                   Upper Body Dressing Details (indicate cue type and reason): Discussed managing cervical collar in preparation for dc home. Educated pt on switching out collar's pads and how to clean them (hand wash and air dry). Pt verbalizing understanding             Tub/ Shower Transfer: Tub transfer;Supervision/safety;Ambulation Tub/Shower Transfer Details (indicate cue type and reason): Educated pt on safe tub trnasfer tehcnique. Simulated in room. Pt requiring supervision for safety but demonstrating safe tub transfer tehcnique. Functional mobility during ADLs: Supervision/safety General ADL Comments: Providing education on collar management and tub trnasfer. pt demonstrating and verbalizing understanding. pt progressing well and feel will be ready for dc from OT soon.     Vision       Perception     Praxis      Cognition Arousal/Alertness: Awake/alert Behavior During Therapy: WFL for tasks assessed/performed Overall Cognitive Status: Within Functional Limits for tasks assessed  Exercises Exercises: Other exercises Other Exercises Other Exercises: Reviewed theraputty excercises. Had pt place 6 paper clips in theraputty and then pull them out of putty  to increase finger/hand strength. Pt demonstrating understanding   Shoulder Instructions       General Comments      Pertinent Vitals/ Pain       Pain Assessment: Faces Faces Pain Scale: Hurts little more Pain Location: upper back Pain Descriptors / Indicators: Aching Pain Intervention(s): Monitored during session;Limited activity within patient's tolerance;Repositioned  Home Living                                          Prior Functioning/Environment              Frequency  Min 1X/week        Progress Toward Goals  OT Goals(current goals can now be found in the care plan section)  Progress towards OT goals: Progressing toward goals  Acute Rehab OT Goals Patient Stated Goal: get better and reduce pain OT Goal Formulation: With patient Time For Goal Achievement: 10/01/17 Potential to Achieve Goals: Good ADL Goals Pt Will Perform Grooming: with set-up;with supervision;standing Pt Will Perform Upper Body Dressing: with set-up;with supervision;sitting(Adhering to cervical precautions) Pt Will Perform Lower Body Dressing: with set-up;with supervision;sit to/from stand;with adaptive equipment Pt Will Transfer to Toilet: with supervision;with set-up;ambulating;bedside commode Pt Will Perform Toileting - Clothing Manipulation and hygiene: with supervision;with set-up;sit to/from stand;with adaptive equipment Pt/caregiver will Perform Home Exercise Program: Increased strength;Both right and left upper extremity;With theraputty;With Supervision;With written HEP provided  Plan Discharge plan remains appropriate;Frequency needs to be updated;Equipment recommendations need to be updated    Co-evaluation                 AM-PAC PT "6 Clicks" Daily Activity     Outcome Measure   Help from another person eating meals?: None Help from another person taking care of personal grooming?: A Little Help from another person toileting, which includes using  toliet, bedpan, or urinal?: A Little Help from another person bathing (including washing, rinsing, drying)?: A Little Help from another person to put on and taking off regular upper body clothing?: None Help from another person to put on and taking off regular lower body clothing?: A Little 6 Click Score: 20    End of Session Equipment Utilized During Treatment: Gait belt;Cervical collar  OT Visit Diagnosis: Unsteadiness on feet (R26.81);Muscle weakness (generalized) (M62.81);Pain Pain - part of body: (neck)   Activity Tolerance Patient tolerated treatment well;Patient limited by pain   Patient Left with call bell/phone within reach;in chair   Nurse Communication Mobility status;Patient requests pain meds        Time: 1445-1453 OT Time Calculation (min): 8 min  Charges: OT General Charges $OT Visit: 1 Visit OT Treatments $Self Care/Home Management : 8-22 mins  [place], OTR/L Acute Rehab Pager: [phone] Office: [place] 10/05/2017, 5:00 PM

## 2017-10-05 NOTE — Progress Notes (Signed)
Continue to have issues with disposition for SNF; verified with attending MD that there are NO other alternatives to IV antibiotics for pt's treatment for bacteremia.    Reinaldo Raddle, RN, BSN  Trauma/Neuro ICU Case Manager [phone]

## 2017-10-05 NOTE — Social Work (Signed)
CSW spoke with Dr. Reynaldo Minium again to follow up regarding inability to place pt in SNF.   Pt unable to place due to ankle monitoring bracelet. RN Case Manager also aware.  CSW continuing to follow.   Alexander Mt, [place] Work [phone]

## 2017-10-05 NOTE — Progress Notes (Addendum)
Patient ID: [name], female   DOB: [birth year], 51 y.o.   MRN: [id number]  PROGRESS NOTE    KHOLE ARTERBURN  HKV:[id number] DOB: December 09, 1965 DOA: 09/11/2017 PCP: Marliss Coots, NP   Brief Narrative:  24 ? PTSD, depression, intravenous drug use, chronic back pain status post previous back surgery, ADD, bilateral ovarian cysts, PID, sciatica presented on 09/11/2017 with severe back and neck pain.   MRI of the T and L-spine revealed ventral epidural abscess- MSSA bacteremia-  ID and neurosurgery were consulted.  uinary retention on 09/15/2017 for which she required emergent C3-C7 laminectomy for decompression of spinal cord and debridement of epidural abscess.  She had TEE on 09/20/2017 which was negative for vegetations.  ID recommends at least 6 weeks of IV antibiotics.   Assessment & Plan:   Principal Problem:   Severe sepsis ([place]) Active Problems:   Hypokalemia   Lymphadenopathy   Back pain   Hyponatremia   Right ovarian cyst   Hypotension   MSSA bacteremia   Abscess in epidural space of thoracic spine   Mediastinal mass   Obesity, Class III, BMI 40-49.9 (morbid obesity) (HCC)   Leucocytosis   Neck pain   Abscess in epidural space of cervical spine   Severe sepsis -Resolved.  Currently hemodynamically stable  MSSA bacteremia -Currently on cefazolin and rifampin as per ID recommendations.  ID recommends at least 6 weeks of antibiotics as per the last note from Dr. Johnnye Sima on 09/27/2017.  Patient will remain inpatient while on intravenous antibiotics. -Negative blood cultures from 09/19/2017.  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   -Dr. Ronnald Ramp followed knot on back 12/11 and felt was non-infectious  Polysubstance  abuse/intravenous drug use -Patient cannot be discharged with IV access -On on oral meds.  I had a detailed discussion with her regarding current pain regimen -she states it has been only 2 weeks since surgery and going down from q 6 prn oral opiates and requests after my discussion with her maybe 1 more week of the saem -I have explained to her usual post-op course and care would dictate a much more conservative approach--usually patients with back surgery would be given 1 week to 10 days -given extenuating circumstances given her Chr LBP will need taper in 2-3 days  Chronic pain syndrome -Continue with Cymbalta, methocarbamol, OxyContin and oxycodone immediate release -Avoid intravenous narcotics  Thrombocytosis -Probably reactive.  Monitor intermittently -Patient was last seen by Dr. Lurline Del on 11/18 for R/O lymphoma -Thrombocytosis improving  -12/1 PBS; thrombocytosis and reactive appearing lymphocytes  Morbid obesity -Follow nutrition consult recommendations, needs outpatient follow-up as well going forward  Hypokalemia -Resolved  Hepatitis C -Outpatient follow-up with ID for consideration of cure based treatments  Diarrhea -Resolved   DVT prophylaxis: Subcutaneous heparin Code Status: Full Family Communication: None at bedside Disposition Plan: To remain inpatient to complete intravenous antibiotics--Has ankle bracelet  Consultants:  Hematology/ Oncology ID Neurosurgery Cardiology   Procedures:  emergent C3-C7 laminectomy for decompression of spinal cord and debridement of epidural abscess on 09/15/2017  TEE on 09/20/2017: No evidence of vegetations  Echo on 09/12/2017 Study Conclusions  - Left ventricle: The cavity size was normal. Wall thickness was   normal. Systolic function was normal. The estimated ejection   fraction was in the range  of 55% to 60%. Wall motion was normal;   there were no regional wall motion abnormalities. Left   ventricular  diastolic function parameters were normal. - Mitral valve: There was mild regurgitation. - Pulmonary arteries: Systolic pressure was mildly increased. PA   peak pressure: 39 mm Hg (S).  Antimicrobials:  Nafcillin 11/19 -12/3 Rifampin 11/22 > Cefazolin 11/18 > 11/19; 12/3> Cefepime 09/11/2017 >09/12/2017 Vancomycin 11/17   Subjective:  Awake alert oriented no distress laying in bed had pain medication earlier this morning and overall seems comfortable with current regimen Understand she needs IV meds via PICC line No chest pain no nausea no vomiting   Objective: Vitals:   10/04/17 1849 10/04/17 2000 10/05/17 0000 10/05/17 0400  BP: (!) 137/95 125/84 124/87 127/90  Pulse: 88 75 77 75  Resp: 16 20 18 18   Temp: 98 F (36.7 C) 98.1 F (36.7 C) 98 F (36.7 C) 98.2 F (36.8 C)  TempSrc: Oral Oral Oral Oral  SpO2: 99% 96% 97% 95%  Weight:      Height:        Intake/Output Summary (Last 24 hours) at 10/05/2017 0805 Last data filed at 10/04/2017 1316 Gross per 24 hour  Intake 700 ml  Output -  Net 700 ml   Filed Weights   09/13/17 0730 09/14/17 0831 09/21/17 0400  Weight: 117.4 kg (258 lb 13.1 oz) 115.8 kg (255 lb 4.7 oz) 111.1 kg (244 lb 14.9 oz)    Examination:  General exam: No acute distress.   Respiratory system: Bilateral decreased breath sound at bases Cardiovascular system: S1 & S2 heard, controlled  Power is 5/5 in lower extremities, sensory is intact Reflexes are intact Smile is symmetric Multiple tattoos upper arms and back   Data Reviewed: I have personally reviewed following labs and imaging studies  CBC: Recent Labs  Lab 10/02/17 0552 10/05/17 0539  WBC 7.6 8.4  NEUTROABS 3.8 4.3  HGB 12.0 11.9*  HCT 36.3 37.2  MCV 92.4 93.2  PLT 540* 220*   Basic Metabolic Panel: Recent Labs  Lab 10/01/17 0400 10/02/17 0552 10/03/17 0911 10/04/17 0539 10/05/17 0539  NA 133* 134* 132* 133* 133*  K 3.7 3.9 3.7 4.0 3.8  CL 95* 97* 95* 95* 95*    CO2 29 27 27 29 29   GLUCOSE 112* 110* 161* 104* 105*  BUN 6 7 6 8 6   CREATININE 0.62 0.61 0.73 0.60 0.65  CALCIUM 9.1 8.9 9.0 9.2 9.3  MG 2.1 2.0 1.8 1.9 1.9   GFR: Estimated Creatinine Clearance: 108.6 mL/min (by C-G formula based on SCr of 0.65 mg/dL). Liver Function Tests: Recent Labs  Lab 10/05/17 0539  AST 40  ALT 20  ALKPHOS 193*  BILITOT 1.3*  PROT 8.8*  ALBUMIN 2.8*   No results for input(s): LIPASE, AMYLASE in the last 168 hours. No results for input(s): AMMONIA in the last 168 hours. Coagulation Profile: No results for input(s): INR, PROTIME in the last 168 hours. Cardiac Enzymes: No results for input(s): CKTOTAL, CKMB, CKMBINDEX, TROPONINI in the last 168 hours. BNP (last 3 results) No results for input(s): PROBNP in the last 8760 hours. HbA1C: No results for input(s): HGBA1C in the last 72 hours. CBG: No results for input(s): GLUCAP in the last 168 hours. Lipid Profile: No results for input(s): CHOL, HDL, LDLCALC, TRIG, CHOLHDL, LDLDIRECT in the last 72 hours. Thyroid Function Tests: No results for input(s): TSH, T4TOTAL, FREET4, T3FREE, THYROIDAB in the last 72 hours. Anemia Panel: No results for input(s):  VITAMINB12, FOLATE, FERRITIN, TIBC, IRON, RETICCTPCT in the last 72 hours. Sepsis Labs: No results for input(s): PROCALCITON, LATICACIDVEN in the last 168 hours.  No results found for this or any previous visit (from the past 240 hour(s)).       Radiology Studies: No results found.      Scheduled Meds: . Chlorhexidine Gluconate Cloth  6 each Topical Q2200  . DULoxetine  60 mg Oral Daily  . heparin injection (subcutaneous)  5,000 Units Subcutaneous Q8H  . oxyCODONE  10 mg Oral Q12H  . rifampin  300 mg Oral Q12H  . senna-docusate  1 tablet Oral BID  . sodium chloride flush  10-40 mL Intracatheter Q12H   Continuous Infusions: .  ceFAZolin (ANCEF) IV Stopped (10/05/17 3419)     LOS: 24 days   Verneita Griffes, MD [facility]   If 7PM-7AM, please contact night-coverage [email or web address] Password [email or web address] 10/05/2017, 8:05 AM

## 2017-10-06 NOTE — Progress Notes (Signed)
Patient ID: [name], female   DOB: 22-Jun-1966, 51 y.o.   MRN: [id number]  PROGRESS NOTE    MELAINIE KRINSKY  HEN:[id number] DOB: [birth year] DOA: 09/11/2017 PCP: Marliss Coots, NP   Brief Narrative:  51 year old female with history of PTSD, depression, intravenous drug use, chronic back pain status post previous back surgery, ADD, bilateral ovarian cysts, PID, sciatica presented on 09/11/2017 with severe back and neck pain.  MRI of the T and L-spine revealed ventral epidural abscess.  She was found to have MSSA bacteremia.  She was put on IV antibiotics.  ID and neurosurgery were consulted.  Patient was transferred to [facility].  She developed urinary retention on 09/15/2017 for which she required emergent C3-C7 laminectomy for decompression of spinal cord and debridement of epidural abscess.  She had TEE on 09/20/2017 which was negative for vegetations.  ID recommends at least 6 weeks of IV antibiotics.   Assessment & Plan:   Principal Problem:   Severe sepsis ([place]) Active Problems:   Hypokalemia   Lymphadenopathy   Back pain   Hyponatremia   Right ovarian cyst   Hypotension   MSSA bacteremia   Abscess in epidural space of thoracic spine   Mediastinal mass   Obesity, Class III, BMI 40-49.9 (morbid obesity) (HCC)   Leucocytosis   Neck pain   Abscess in epidural space of cervical spine   Severe sepsis -Resolved.  Currently hemodynamically stable  MSSA bacteremia -Currently on cefazolin and rifampin as per ID recommendations.  ID recommends at least 6 weeks of antibiotics as per the last note from Dr. Johnnye Sima on 09/27/2017.  Patient will remain inpatient while on intravenous antibiotics. 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    Polysubstance abuse/intravenous drug use -Patient cannot be discharged with IV access  Chronic pain syndrome -Continue with Cymbalta, methocarbamol, OxyContin and oxycodone immediate release -Avoid intravenous narcotics  Thrombocytosis -Probably reactive.  Monitor intermittently -Patient was last seen by Dr. Lurline Del on 11/18 for R/O lymphoma -Thrombocytosis improving  -12/1 PBS; thrombocytosis and reactive appearing lymphocytes   Morbid obesity -Follow nutrition consult recommendations  Hypokalemia -Resolved  Hepatitis C -Outpatient follow-up with ID  Diarrhea -Resolved   DVT prophylaxis: Subcutaneous heparin Code Status: Full Family Communication: None at bedside Disposition Plan: To remain inpatient to complete intravenous antibiotics  Consultants:  Hematology/ Oncology ID Neurosurgery Cardiology   Procedures:  emergent C3-C7 laminectomy for decompression of spinal cord and debridement of epidural abscess on 09/15/2017  TEE on 09/20/2017: No evidence of vegetations  Echo on 09/12/2017 Study Conclusions  - Left ventricle: The cavity size was normal. Wall thickness was   normal. Systolic function was normal. The estimated ejection   fraction was in the range of 55% to 60%. Wall motion was normal;   there were no regional wall motion abnormalities. Left   ventricular diastolic function parameters were normal. - Mitral valve: There was mild regurgitation. - Pulmonary arteries: Systolic pressure was mildly increased. PA   peak pressure: 39 mm Hg (S).  Antimicrobials:  Nafcillin 11/19 -12/3 Rifampin 11/22 > Cefazolin 11/18 > 11/19; 12/3> Cefepime 09/11/2017 >09/12/2017 Vancomycin 11/17   Subjective: Patient seen and examined at bedside.  She complains of back pain but she is able to walk around.  No overnight fever, nausea or vomiting.  Objective: Vitals:   10/05/17 1725 10/05/17  2140 10/06/17 0400 10/06/17 0728  BP: 125/85 122/86 121/82 125/77  Pulse: 80  80 72  Resp: 18     Temp: 98.4 F (36.9 C) 98.1 F (36.7 C) 98.2 F (36.8 C) 98.2 F (36.8 C)  TempSrc: Oral Oral Oral Oral  SpO2: 96%   94%  Weight:      Height:        Intake/Output Summary (Last 24 hours) at 10/06/2017 0830 Last data filed at 10/06/2017 0000 Gross per 24 hour  Intake 640 ml  Output 2 ml  Net 638 ml   Filed Weights   09/13/17 0730 09/14/17 0831 09/21/17 0400  Weight: 117.4 kg (258 lb 13.1 oz) 115.8 kg (255 lb 4.7 oz) 111.1 kg (244 lb 14.9 oz)    Examination:  General exam: No acute distress. Alert and awake Respiratory system: Bilateral decreased breath sound at bases Cardiovascular system: S1 & S2 heard, controlled    Data Reviewed: I have personally reviewed following labs and imaging studies  CBC: Recent Labs  Lab 10/02/17 0552 10/05/17 0539  WBC 7.6 8.4  NEUTROABS 3.8 4.3  HGB 12.0 11.9*  HCT 36.3 37.2  MCV 92.4 93.2  PLT 540* 614*   Basic Metabolic Panel: Recent Labs  Lab 10/01/17 0400 10/02/17 0552 10/03/17 0911 10/04/17 0539 10/05/17 0539  NA 133* 134* 132* 133* 133*  K 3.7 3.9 3.7 4.0 3.8  CL 95* 97* 95* 95* 95*  CO2 29 27 27 29 29   GLUCOSE 112* 110* 161* 104* 105*  BUN 6 7 6 8 6   CREATININE 0.62 0.61 0.73 0.60 0.65  CALCIUM 9.1 8.9 9.0 9.2 9.3  MG 2.1 2.0 1.8 1.9 1.9   GFR: Estimated Creatinine Clearance: 108.6 mL/min (by C-G formula based on SCr of 0.65 mg/dL). Liver Function Tests: Recent Labs  Lab 10/05/17 0539  AST 40  ALT 20  ALKPHOS 193*  BILITOT 1.3*  PROT 8.8*  ALBUMIN 2.8*   No results for input(s): LIPASE, AMYLASE in the last 168 hours. No results for input(s): AMMONIA in the last 168 hours. Coagulation Profile: No results for input(s): INR, PROTIME in the last 168 hours. Cardiac Enzymes: No results for input(s): CKTOTAL, CKMB, CKMBINDEX, TROPONINI in the last 168 hours. BNP (last 3 results) No results for input(s):  PROBNP in the last 8760 hours. HbA1C: No results for input(s): HGBA1C in the last 72 hours. CBG: No results for input(s): GLUCAP in the last 168 hours. Lipid Profile: No results for input(s): CHOL, HDL, LDLCALC, TRIG, CHOLHDL, LDLDIRECT in the last 72 hours. Thyroid Function Tests: No results for input(s): TSH, T4TOTAL, FREET4, T3FREE, THYROIDAB in the last 72 hours. Anemia Panel: No results for input(s): VITAMINB12, FOLATE, FERRITIN, TIBC, IRON, RETICCTPCT in the last 72 hours. Sepsis Labs: No results for input(s): PROCALCITON, LATICACIDVEN in the last 168 hours.  No results found for this or any previous visit (from the past 240 hour(s)).       Radiology Studies: No results found.      Scheduled Meds: . Chlorhexidine Gluconate Cloth  6 each Topical Q2200  . DULoxetine  60 mg Oral Daily  . heparin injection (subcutaneous)  5,000 Units Subcutaneous Q8H  . oxyCODONE  10 mg Oral Q12H  . rifampin  300 mg Oral Q12H  . senna-docusate  1 tablet Oral BID  . sodium chloride flush  10-40 mL Intracatheter Q12H   Continuous Infusions: .  ceFAZolin (  ANCEF) IV Stopped (10/06/17 0610)     LOS: 25 days        Aline August, MD [facility] Pager [phone]  If 7PM-7AM, please contact night-coverage [email or web address] Password [email or web address] 10/06/2017, 8:30 AM

## 2017-10-06 NOTE — Social Work (Signed)
CSW spoke with pt at bedside regarding upcoming court date.   Pt stated that her court date is on 11/05/2017, pt also confirmed that she has an ankle monitoring device that has been there since early November, placed by [facility] [place] [facility]. Pt states "I am not a bad person, I just made a bad decision." CSW provided active listening and supported patient as she talked about her upcoming court date.   CSW will continue to provide support for pt discharge when medically appropriate.   Alexander Mt, [place] Work [phone]

## 2017-10-07 NOTE — Progress Notes (Signed)
Patient ID: [name], female   DOB: 1966/01/24, 51 y.o.   MRN: [id number]  PROGRESS NOTE    KIANAH HARRIES  BTD:[id number] DOB: [birth year] DOA: 09/11/2017 PCP: Marliss Coots, NP   Brief Narrative:  51 year old female with history of PTSD, depression, intravenous drug use, chronic back pain status post previous back surgery, ADD, bilateral ovarian cysts, PID, sciatica presented on 09/11/2017 with severe back and neck pain.  MRI of the T and L-spine revealed ventral epidural abscess.  She was found to have MSSA bacteremia.  She was put on IV antibiotics.  ID and neurosurgery were consulted.  Patient was transferred to [facility].  She developed urinary retention on 09/15/2017 for which she required emergent C3-C7 laminectomy for decompression of spinal cord and debridement of epidural abscess.  She had TEE on 09/20/2017 which was negative for vegetations.  ID recommends at least 6 weeks of IV antibiotics.   Assessment & Plan:   Principal Problem:   Severe sepsis ([place]) Active Problems:   Hypokalemia   Lymphadenopathy   Back pain   Hyponatremia   Right ovarian cyst   Hypotension   MSSA bacteremia   Abscess in epidural space of thoracic spine   Mediastinal mass   Obesity, Class III, BMI 40-49.9 (morbid obesity) (HCC)   Leucocytosis   Neck pain   Abscess in epidural space of cervical spine   Severe sepsis -Resolved.  Currently hemodynamically stable  MSSA bacteremia -Currently on cefazolin and rifampin as per ID recommendations.  ID recommends at least 6 weeks of antibiotics as per the last note from Dr. Johnnye Sima on 09/27/2017.  Patient will remain inpatient while on intravenous antibiotics. 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    Polysubstance abuse/intravenous drug use -Patient cannot be discharged with IV access  Chronic pain syndrome -Continue with Cymbalta, methocarbamol, OxyContin and oxycodone immediate release -Avoid intravenous narcotics  Thrombocytosis -Probably reactive.  Monitor intermittently -Patient was last seen by Dr. Lurline Del on 11/18 for R/O lymphoma -Thrombocytosis improving  -12/1 PBS; thrombocytosis and reactive appearing lymphocytes   Morbid obesity -Follow nutrition consult recommendations  Hypokalemia -Resolved  Hepatitis C -Outpatient follow-up with ID  Diarrhea -Resolved   DVT prophylaxis: Subcutaneous heparin Code Status: Full Family Communication: None at bedside Disposition Plan: To remain inpatient to complete intravenous antibiotics  Consultants:  Hematology/ Oncology ID Neurosurgery Cardiology   Procedures:  emergent C3-C7 laminectomy for decompression of spinal cord and debridement of epidural abscess on 09/15/2017  TEE on 09/20/2017: No evidence of vegetations  Echo on 09/12/2017 Study Conclusions  - Left ventricle: The cavity size was normal. Wall thickness was   normal. Systolic function was normal. The estimated ejection   fraction was in the range of 55% to 60%. Wall motion was normal;   there were no regional wall motion abnormalities. Left   ventricular diastolic function parameters were normal. - Mitral valve: There was mild regurgitation. - Pulmonary arteries: Systolic pressure was mildly increased. PA   peak pressure: 39 mm Hg (S).  Antimicrobials:  Nafcillin 11/19 -12/3 Rifampin 11/22 > Cefazolin 11/18 > 11/19; 12/3> Cefepime 09/11/2017 >09/12/2017 Vancomycin 11/17   Subjective: Patient seen and examined at bedside. No overnight fever, nausea or vomiting.  No new complaints  Objective: Vitals:  10/06/17 2347 10/07/17 0411 10/07/17 0740 10/07/17 1200  BP:  106/77 105/68 117/79 126/78  Pulse: 77 75 70 76  Resp: 16 18    Temp: 98.5 F (36.9 C) 98.3 F (36.8 C) 98.1 F (36.7 C) 98.2 F (36.8 C)  TempSrc: Oral Oral Oral Oral  SpO2: 95% 95% 95%   Weight:      Height:        Intake/Output Summary (Last 24 hours) at 10/07/2017 1237 Last data filed at 10/07/2017 1000 Gross per 24 hour  Intake 2280 ml  Output 2 ml  Net 2278 ml   Filed Weights   09/13/17 0730 09/14/17 0831 09/21/17 0400  Weight: 117.4 kg (258 lb 13.1 oz) 115.8 kg (255 lb 4.7 oz) 111.1 kg (244 lb 14.9 oz)    Examination:  General exam: No acute distress. Respiratory system: Bilateral decreased breath sound at bases Cardiovascular system: S1 & S2 heard, controlled    Data Reviewed: I have personally reviewed following labs and imaging studies  CBC: Recent Labs  Lab 10/02/17 0552 10/05/17 0539  WBC 7.6 8.4  NEUTROABS 3.8 4.3  HGB 12.0 11.9*  HCT 36.3 37.2  MCV 92.4 93.2  PLT 540* 875*   Basic Metabolic Panel: Recent Labs  Lab 10/01/17 0400 10/02/17 0552 10/03/17 0911 10/04/17 0539 10/05/17 0539  NA 133* 134* 132* 133* 133*  K 3.7 3.9 3.7 4.0 3.8  CL 95* 97* 95* 95* 95*  CO2 29 27 27 29 29   GLUCOSE 112* 110* 161* 104* 105*  BUN 6 7 6 8 6   CREATININE 0.62 0.61 0.73 0.60 0.65  CALCIUM 9.1 8.9 9.0 9.2 9.3  MG 2.1 2.0 1.8 1.9 1.9   GFR: Estimated Creatinine Clearance: 108.6 mL/min (by C-G formula based on SCr of 0.65 mg/dL). Liver Function Tests: Recent Labs  Lab 10/05/17 0539  AST 40  ALT 20  ALKPHOS 193*  BILITOT 1.3*  PROT 8.8*  ALBUMIN 2.8*   No results for input(s): LIPASE, AMYLASE in the last 168 hours. No results for input(s): AMMONIA in the last 168 hours. Coagulation Profile: No results for input(s): INR, PROTIME in the last 168 hours. Cardiac Enzymes: No results for input(s): CKTOTAL, CKMB, CKMBINDEX, TROPONINI in the last 168 hours. BNP (last 3 results) No results for input(s): PROBNP in the last 8760 hours. HbA1C: No  results for input(s): HGBA1C in the last 72 hours. CBG: No results for input(s): GLUCAP in the last 168 hours. Lipid Profile: No results for input(s): CHOL, HDL, LDLCALC, TRIG, CHOLHDL, LDLDIRECT in the last 72 hours. Thyroid Function Tests: No results for input(s): TSH, T4TOTAL, FREET4, T3FREE, THYROIDAB in the last 72 hours. Anemia Panel: No results for input(s): VITAMINB12, FOLATE, FERRITIN, TIBC, IRON, RETICCTPCT in the last 72 hours. Sepsis Labs: No results for input(s): PROCALCITON, LATICACIDVEN in the last 168 hours.  No results found for this or any previous visit (from the past 240 hour(s)).       Radiology Studies: No results found.      Scheduled Meds: . Chlorhexidine Gluconate Cloth  6 each Topical Q2200  . DULoxetine  60 mg Oral Daily  . heparin injection (subcutaneous)  5,000 Units Subcutaneous Q8H  . oxyCODONE  10 mg Oral Q12H  . rifampin  300 mg Oral Q12H  . senna-docusate  1 tablet Oral BID  . sodium chloride flush  10-40 mL Intracatheter Q12H   Continuous Infusions: .  ceFAZolin (ANCEF) IV Stopped (10/07/17 6433)     LOS: 26 days  Aline August, MD [facility] Pager [phone]  If 7PM-7AM, please contact night-coverage [email or web address] Password [facility] 10/07/2017, 12:37 PM

## 2017-10-07 NOTE — Progress Notes (Signed)
Physical Therapy Treatment Patient Details Name: [name] MRN: [id number] DOB: 12-Apr-1966 Today's Date: 10/07/2017    History of Present Illness 51 year old female w/ a history of IV drug use, hep C and chronic back pain status post previous back surgery who presented to the ED with severe back and neck pain.  An MRI of the T and L-spine revealed a ventral epidural abscess C2-t8. 1/21 Pt with emergent C3-7 lami and epidural abscess debridement    PT Comments    Pt fatigued quickly this session. She reports she is just tired today. Pt able to ambulate 300 feet supervision and ascend/descend 12 steps with rail min guard assist. Goals reviewed and updated. Anticipate pt to achieve all PT goals in next 1-2 sessions.     Follow Up Recommendations  Supervision for mobility/OOB     Equipment Recommendations  None recommended by PT    Recommendations for Other Services       Precautions / Restrictions Precautions Precautions: Cervical Precaution Comments: Reviewed cervical precautions with pt Required Braces or Orthoses: Cervical Brace Cervical Brace: Hard collar;Other (comment)(when OOB walking) Restrictions Other Position/Activity Restrictions: Pt independent with don/doff brace.    Mobility  Bed Mobility Overal bed mobility: Modified Independent                Transfers Overall transfer level: Modified independent Equipment used: None                Ambulation/Gait Ambulation/Gait assistance: Supervision Ambulation Distance (Feet): 300 Feet Assistive device: None Gait Pattern/deviations: Step-through pattern;Decreased stride length Gait velocity: decreased Gait velocity interpretation: Below normal speed for age/gender General Gait Details: steady gait   Stairs Stairs: Yes   Stair Management: One rail Left;Forwards;Step to pattern Number of Stairs: 12 General stair comments: slow, guarded. No physical assist. Pt notably  fatigued.  Wheelchair Mobility    Modified Rankin (Stroke Patients Only)       Balance   Sitting-balance support: Feet supported;No upper extremity supported Sitting balance-Leahy Scale: Good     Standing balance support: No upper extremity supported;During functional activity Standing balance-Leahy Scale: Good                              Cognition Arousal/Alertness: Awake/alert Behavior During Therapy: WFL for tasks assessed/performed Overall Cognitive Status: Within Functional Limits for tasks assessed                                        Exercises      General Comments        Pertinent Vitals/Pain Pain Assessment: 0-10 Pain Score: 7  Pain Location: neck Pain Descriptors / Indicators: Sore Pain Intervention(s): Monitored during session;Repositioned    Home Living                      Prior Function            PT Goals (current goals can now be found in the care plan section) Acute Rehab PT Goals Patient Stated Goal: get better and reduce pain PT Goal Formulation: With patient Time For Goal Achievement: 10/15/17 Potential to Achieve Goals: Good Progress towards PT goals: Progressing toward goals    Frequency    Min 2X/week      PT Plan Current plan remains appropriate    Co-evaluation  AM-PAC PT "6 Clicks" Daily Activity  Outcome Measure  Difficulty turning over in bed (including adjusting bedclothes, sheets and blankets)?: None Difficulty moving from lying on back to sitting on the side of the bed? : None Difficulty sitting down on and standing up from a chair with arms (e.g., wheelchair, bedside commode, etc,.)?: None Help needed moving to and from a bed to chair (including a wheelchair)?: None Help needed walking in hospital room?: None Help needed climbing 3-5 steps with a railing? : A Little 6 Click Score: 23    End of Session Equipment Utilized During Treatment: Cervical  collar Activity Tolerance: Patient tolerated treatment well Patient left: Other (comment)(in bathroom) Nurse Communication: Mobility status PT Visit Diagnosis: Other abnormalities of gait and mobility (R26.89)     Time: 6579-0383 PT Time Calculation (min) (ACUTE ONLY): 11 min  Charges:  $Gait Training: 8-22 mins                    G Codes:       Lorrin Goodell, PT  Office # [phone] Pager [phone]    [name] 10/07/2017, 1:31 PM

## 2017-10-08 LAB — CBC WITH DIFFERENTIAL/PLATELET
Basophils Absolute: 0.1 10*3/uL (ref 0.0–0.1)
Basophils Relative: 1 %
EOS ABS: 0.9 10*3/uL — AB (ref 0.0–0.7)
Eosinophils Relative: 9 %
HCT: 35.9 % — ABNORMAL LOW (ref 36.0–46.0)
HEMOGLOBIN: 11.7 g/dL — AB (ref 12.0–15.0)
LYMPHS ABS: 2.7 10*3/uL (ref 0.7–4.0)
LYMPHS PCT: 27 %
MCH: 30.2 pg (ref 26.0–34.0)
MCHC: 32.6 g/dL (ref 30.0–36.0)
MCV: 92.8 fL (ref 78.0–100.0)
Monocytes Absolute: 1 10*3/uL (ref 0.1–1.0)
Monocytes Relative: 10 %
NEUTROS ABS: 5.5 10*3/uL (ref 1.7–7.7)
NEUTROS PCT: 53 %
Platelets: 467 10*3/uL — ABNORMAL HIGH (ref 150–400)
RBC: 3.87 MIL/uL (ref 3.87–5.11)
RDW: 15.2 % (ref 11.5–15.5)
WBC: 10.2 10*3/uL (ref 4.0–10.5)

## 2017-10-08 LAB — COMPREHENSIVE METABOLIC PANEL
ALK PHOS: 164 U/L — AB (ref 38–126)
ALT: 20 U/L (ref 14–54)
AST: 39 U/L (ref 15–41)
Albumin: 2.7 g/dL — ABNORMAL LOW (ref 3.5–5.0)
Anion gap: 9 (ref 5–15)
BUN: 8 mg/dL (ref 6–20)
CALCIUM: 9.1 mg/dL (ref 8.9–10.3)
CO2: 28 mmol/L (ref 22–32)
CREATININE: 0.71 mg/dL (ref 0.44–1.00)
Chloride: 96 mmol/L — ABNORMAL LOW (ref 101–111)
GFR calc non Af Amer: >60 mL/min (ref >60)
GLUCOSE: 152 mg/dL — AB (ref 65–99)
Potassium: 3.7 mmol/L (ref 3.5–5.1)
SODIUM: 133 mmol/L — AB (ref 135–145)
Total Bilirubin: 1 mg/dL (ref 0.3–1.2)
Total Protein: 8.1 g/dL (ref 6.5–8.1)

## 2017-10-08 LAB — C-REACTIVE PROTEIN: CRP: 2.6 mg/dL — AB (ref <1.0)

## 2017-10-08 LAB — MAGNESIUM: Magnesium: 1.8 mg/dL (ref 1.7–2.4)

## 2017-10-08 NOTE — Progress Notes (Signed)
Occupational Therapy Treatment Patient Details Name: [name] MRN: [id number] DOB: 04-10-1966 Today's Date: 10/08/2017    History of present illness 51 year old female w/ a history of IV drug use, hep C and chronic back pain status post previous back surgery who presented to the ED with severe back and neck pain.  An MRI of the T and L-spine revealed a ventral epidural abscess C2-t8. 1/21 Pt with emergent C3-7 lami and epidural abscess debridement   OT comments  All OT goals met and no further skilled OT needed at this time. Pt is at adequate level for d/c home from OT standpoing. Pt able to complete all adls and reach bil LE at this time with CCollar in place.    Follow Up Recommendations       Equipment Recommendations       Recommendations for Other Services      Precautions / Restrictions Precautions Precautions: Cervical Cervical Brace: Hard collar;Other (comment)       Mobility Bed Mobility Overal bed mobility: Modified Independent                Transfers Overall transfer level: Modified independent                    Balance                                           ADL either performed or assessed with clinical judgement   ADL Overall ADL's : Modified independent                                       General ADL Comments: pt able to complete full body bath with antibacterial wipes, static standing to don brace, able to complete bed mobility, able to demonstrate tub transfer, toilet transfer and don LB clothign     Vision       Perception     Praxis      Cognition Arousal/Alertness: Awake/alert Behavior During Therapy: WFL for tasks assessed/performed Overall Cognitive Status: Within Functional Limits for tasks assessed                                          Exercises     Shoulder Instructions       General Comments      Pertinent Vitals/ Pain       Pain  Assessment: No/denies pain  Home Living                                          Prior Functioning/Environment              Frequency           Progress Toward Goals  OT Goals(current goals can now be found in the care plan section)  Progress towards OT goals: Goals met/education completed, patient discharged from OT  Acute Rehab OT Goals Patient Stated Goal: to take a shower and wash her hair ( unable to get in shower due to picc line) OT Goal Formulation: With patient Time For Goal Achievement: 10/01/17 Potential  to Achieve Goals: Good ADL Goals Pt Will Perform Grooming: with set-up;with supervision;standing Pt Will Perform Upper Body Dressing: with set-up;with supervision;sitting Pt Will Perform Lower Body Dressing: with set-up;with supervision;sit to/from stand;with adaptive equipment Pt Will Transfer to Toilet: with supervision;with set-up;ambulating;bedside commode Pt Will Perform Toileting - Clothing Manipulation and hygiene: with supervision;with set-up;sit to/from stand;with adaptive equipment Pt/caregiver will Perform Home Exercise Program: Increased strength;Both right and left upper extremity;With theraputty;With Supervision;With written HEP provided  Plan All goals met and education completed, patient discharged from OT services    Co-evaluation                 AM-PAC PT "6 Clicks" Daily Activity     Outcome Measure   Help from another person eating meals?: None Help from another person taking care of personal grooming?: None Help from another person toileting, which includes using toliet, bedpan, or urinal?: None Help from another person bathing (including washing, rinsing, drying)?: None   Help from another person to put on and taking off regular lower body clothing?: None 6 Click Score: 20    End of Session Equipment Utilized During Treatment: Gait belt;Cervical collar      Activity Tolerance Patient tolerated treatment  well;Patient limited by pain   Patient Left with call bell/phone within reach;in chair   Nurse Communication Mobility status;Patient requests pain meds        Time: 1000-1010 OT Time Calculation (min): 10 min  Charges: OT General Charges $OT Visit: 1 Visit OT Treatments $Self Care/Home Management : 8-22 mins   Jeri Modena   OTR/L Pager: [phone] Office: [phone] .    Parke Poisson B 10/08/2017, 3:33 PM

## 2017-10-08 NOTE — Progress Notes (Signed)
Patient ID: [name], female   DOB: 11-09-1965, 51 y.o.   MRN: [id number]  PROGRESS NOTE    DESTYNEE Kennedy  JSH:[id number] DOB: May 14, 1966 DOA: 09/11/2017 PCP: Marliss Coots, NP   Brief Narrative:  51 year old female with history of PTSD, depression, intravenous drug use, chronic back pain status post previous back surgery, ADD, bilateral ovarian cysts, PID, sciatica presented on 09/11/2017 with severe back and neck pain.  MRI of the T and L-spine revealed ventral epidural abscess.  She was found to have MSSA bacteremia.  She was put on IV antibiotics.  ID and neurosurgery were consulted.  Patient was transferred to [facility].  She developed urinary retention on 09/15/2017 for which she required emergent C3-C7 laminectomy for decompression of spinal cord and debridement of epidural abscess.  She had TEE on 09/20/2017 which was negative for vegetations.  ID recommends at least 6 weeks of IV antibiotics.  10/08/17 - Patient seen alongside patient's Nurse. No new complaints. The plan is for patient to complete course of antibiotics. Need to desist from illicit drug use discussed with the patient extensively.   Assessment & Plan:   Principal Problem:   Severe sepsis ([place]) Active Problems:   Hypokalemia   Lymphadenopathy   Back pain   Hyponatremia   Right ovarian cyst   Hypotension   MSSA bacteremia   Abscess in epidural space of thoracic spine   Mediastinal mass   Obesity, Class III, BMI 40-49.9 (morbid obesity) (HCC)   Leucocytosis   Neck pain   Abscess in epidural space of cervical spine   Severe sepsis -Resolved.  Currently hemodynamically stable  MSSA bacteremia -Currently on cefazolin and rifampin as per ID recommendations.  ID recommends at least 6 weeks of antibiotics as per the last note from Dr. Johnnye Sima on 09/27/2017.  Patient will remain inpatient while on intravenous antibiotics. 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    Polysubstance abuse/intravenous drug use -Patient cannot be discharged with IV access  Chronic pain syndrome -Continue with Cymbalta, methocarbamol, OxyContin and oxycodone immediate release -Avoid intravenous narcotics  Thrombocytosis -Probably reactive.  Monitor intermittently -Patient was last seen by Dr. Lurline Del on 11/18 for R/O lymphoma -Thrombocytosis improving  -12/1 PBS; thrombocytosis and reactive appearing lymphocytes   Morbid obesity -Follow nutrition consult recommendations  Hypokalemia -Resolved  Hepatitis C -Outpatient follow-up with ID  Diarrhea -Resolved   DVT prophylaxis: Subcutaneous heparin Code Status: Full Family Communication: None at bedside Disposition Plan: To remain inpatient to complete intravenous antibiotics  Consultants:  Hematology/ Oncology ID Neurosurgery Cardiology   Procedures:  emergent C3-C7 laminectomy for decompression of spinal cord and debridement of epidural abscess on 09/15/2017  TEE on 09/20/2017: No evidence of vegetations  Echo on 09/12/2017 Study Conclusions  - Left ventricle: The cavity size was normal. Wall thickness was   normal. Systolic function was normal. The estimated ejection   fraction was in the range of 55% to 60%. Wall motion was normal;   there were no regional wall motion abnormalities. Left   ventricular diastolic function parameters were normal. - Mitral valve: There was mild regurgitation. - Pulmonary arteries: Systolic pressure was mildly increased. PA   peak pressure: 39 mm Hg (S).  Antimicrobials:  Nafcillin 11/19 -12/3 Rifampin 11/22 >  Cefazolin 11/18 > 11/19; 12/3> Cefepime 09/11/2017  >09/12/2017 Vancomycin 11/17   Subjective: No new complaints. No fever or chills.  Objective: Vitals:   10/08/17 0324 10/08/17 0818 10/08/17 1119 10/08/17 1538  BP: 110/72 (!) 116/92 130/74 99/65  Pulse: 80 64 73 86  Resp: 18  16 16   Temp: 98.6 F (37 C) 98.3 F (36.8 C) 97.9 F (36.6 C) 98.2 F (36.8 C)  TempSrc: Oral Oral Oral Oral  SpO2: 95% 96% 98% 98%  Weight:      Height:        Intake/Output Summary (Last 24 hours) at 10/08/2017 1540 Last data filed at 10/08/2017 1351 Gross per 24 hour  Intake 990 ml  Output -  Net 990 ml   Filed Weights   09/13/17 0730 09/14/17 0831 09/21/17 0400  Weight: 117.4 kg (258 lb 13.1 oz) 115.8 kg (255 lb 4.7 oz) 111.1 kg (244 lb 14.9 oz)    Examination:  General exam: No acute distress. Awake and alert NECK - No JVD Respiratory system: Clear to auscultation anteriorly Cardiovascular system: S1 & S2. Neuro - Awake and alert Extremities - No leg edema.  Data Reviewed: I have personally reviewed following labs and imaging studies  CBC: Recent Labs  Lab 10/02/17 0552 10/05/17 0539 10/08/17 0610  WBC 7.6 8.4 10.2  NEUTROABS 3.8 4.3 5.5  HGB 12.0 11.9* 11.7*  HCT 36.3 37.2 35.9*  MCV 92.4 93.2 92.8  PLT 540* 505* 127*   Basic Metabolic Panel: Recent Labs  Lab 10/02/17 0552 10/03/17 0911 10/04/17 0539 10/05/17 0539 10/08/17 0610  NA 134* 132* 133* 133* 133*  K 3.9 3.7 4.0 3.8 3.7  CL 97* 95* 95* 95* 96*  CO2 27 27 29 29 28   GLUCOSE 110* 161* 104* 105* 152*  BUN 7 6 8 6 8   CREATININE 0.61 0.73 0.60 0.65 0.71  CALCIUM 8.9 9.0 9.2 9.3 9.1  MG 2.0 1.8 1.9 1.9 1.8   GFR: Estimated Creatinine Clearance: 108.6 mL/min (by C-G formula based on SCr of 0.71 mg/dL). Liver Function Tests: Recent Labs  Lab 10/05/17 0539 10/08/17 0610  AST 40 39  ALT 20 20  ALKPHOS 193* 164*  BILITOT 1.3* 1.0  PROT 8.8* 8.1  ALBUMIN 2.8* 2.7*   No results for input(s): LIPASE, AMYLASE in the last 168 hours. No results for  input(s): AMMONIA in the last 168 hours. Coagulation Profile: No results for input(s): INR, PROTIME in the last 168 hours. Cardiac Enzymes: No results for input(s): CKTOTAL, CKMB, CKMBINDEX, TROPONINI in the last 168 hours. BNP (last 3 results) No results for input(s): PROBNP in the last 8760 hours. HbA1C: No results for input(s): HGBA1C in the last 72 hours. CBG: No results for input(s): GLUCAP in the last 168 hours. Lipid Profile: No results for input(s): CHOL, HDL, LDLCALC, TRIG, CHOLHDL, LDLDIRECT in the last 72 hours. Thyroid Function Tests: No results for input(s): TSH, T4TOTAL, FREET4, T3FREE, THYROIDAB in the last 72 hours. Anemia Panel: No results for input(s): VITAMINB12, FOLATE, FERRITIN, TIBC, IRON, RETICCTPCT in the last 72 hours. Sepsis Labs: No results for input(s): PROCALCITON, LATICACIDVEN in the last 168 hours.  No results found for this or any previous visit (from the past 240 hour(s)).       Radiology Studies: No results found.      Scheduled Meds: . Chlorhexidine Gluconate Cloth  6 each Topical Q2200  . DULoxetine  60 mg Oral Daily  . heparin injection (subcutaneous)  5,000 Units Subcutaneous Q8H  . oxyCODONE  10 mg Oral Q12H  . rifampin  300 mg Oral Q12H  . senna-docusate  1 tablet Oral BID  . sodium chloride flush  10-40 mL Intracatheter Q12H   Continuous Infusions: .  ceFAZolin (ANCEF) IV 2 g (10/08/17 1352)     LOS: 27 days        Bonnell Public, MD [facility] Pager [phone]  If 7PM-7AM, please contact night-coverage [email or web address] Password [email or web address] 10/08/2017, 3:40 PM

## 2017-10-09 NOTE — Progress Notes (Signed)
Patient ID: [name], female   DOB: 1966/06/05, 51 y.o.   MRN: [id number]  PROGRESS NOTE    [name]  UKG:[id number] DOB: Jun 10, 1966 DOA: 09/11/2017 PCP: Marliss Coots, NP   Brief Narrative:  51 year old female with history of PTSD, depression, intravenous drug use, chronic back pain status post previous back surgery, ADD, bilateral ovarian cysts, PID, sciatica presented on 09/11/2017 with severe back and neck pain.  MRI of the T and L-spine revealed ventral epidural abscess.  She was found to have MSSA bacteremia.  She was put on IV antibiotics.  ID and neurosurgery were consulted.  Patient was transferred to [facility].  She developed urinary retention on 09/15/2017 for which she required emergent C3-C7 laminectomy for decompression of spinal cord and debridement of epidural abscess.  She had TEE on 09/20/2017 which was negative for vegetations.  ID recommends at least 6 weeks of IV antibiotics.  10/09/17 - Patient seen. No new complaints. Patient wants to take a shower. The plan is for patient to complete course of antibiotics. Patient needs to desist from illicit drug use.   Assessment & Plan:   Principal Problem:   Severe sepsis ([place]) Active Problems:   Hypokalemia   Lymphadenopathy   Back pain   Hyponatremia   Right ovarian cyst   Hypotension   MSSA bacteremia   Abscess in epidural space of thoracic spine   Mediastinal mass   Obesity, Class III, BMI 40-49.9 (morbid obesity) (HCC)   Leucocytosis   Neck pain   Abscess in epidural space of cervical spine   Severe sepsis -Resolved.  Currently hemodynamically stable  MSSA bacteremia -Currently on cefazolin and rifampin as per ID recommendations.  ID recommends at least 6 weeks of antibiotics as per the last note from Dr. Johnnye Sima on 09/27/2017.  Patient will remain inpatient while on intravenous antibiotics. 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    Polysubstance abuse/intravenous drug use -Patient cannot be discharged with IV access  Chronic pain syndrome -Continue with Cymbalta, methocarbamol, OxyContin and oxycodone immediate release -Avoid intravenous narcotics  Thrombocytosis -Probably reactive.  Monitor intermittently -Patient was last seen by Dr. Lurline Del on 11/18 for R/O lymphoma -Thrombocytosis improving  -12/1 PBS; thrombocytosis and reactive appearing lymphocytes   Morbid obesity -Follow nutrition consult recommendations  Hypokalemia -Resolved  Hepatitis C -Outpatient follow-up with ID  Diarrhea -Resolved   DVT prophylaxis: Subcutaneous heparin Code Status: Full Family Communication: None at bedside Disposition Plan: To remain inpatient to complete intravenous antibiotics  Consultants:  Hematology/ Oncology ID Neurosurgery Cardiology   Procedures:  emergent C3-C7 laminectomy for decompression of spinal cord and debridement of epidural abscess on 09/15/2017  TEE on 09/20/2017: No evidence of vegetations  Echo on 09/12/2017 Study Conclusions  - Left ventricle: The cavity size was normal. Wall thickness was   normal. Systolic function was normal. The estimated ejection   fraction was in the range of 55% to 60%. Wall motion was normal;   there were no regional wall motion abnormalities. Left   ventricular diastolic function parameters were normal. - Mitral valve: There was mild regurgitation. - Pulmonary arteries: Systolic pressure was mildly increased. PA   peak pressure: 39 mm Hg (S).  Antimicrobials:  Nafcillin 11/19 -12/3 Rifampin 11/22 >  Cefazolin 11/18 > 11/19; 12/3> Cefepime 09/11/2017 >09/12/2017 Vancomycin 11/17   Subjective: No  new complaints. No fever or chills.  Objective: Vitals:   10/09/17 0311 10/09/17 0858 10/09/17 1239 10/09/17 1544  BP: 120/84 (!) 147/86 119/88 116/79  Pulse: 77 75 70 79  Resp: 16  16 16   Temp: 97.9 F (36.6 C) 97.8 F (36.6 C) 98.1 F (36.7 C) 98.4 F (36.9 C)  TempSrc: Oral Oral Oral Oral  SpO2: 99% 98% 96% 97%  Weight:      Height:        Intake/Output Summary (Last 24 hours) at 10/09/2017 1721 Last data filed at 10/09/2017 1501 Gross per 24 hour  Intake 660 ml  Output 475 ml  Net 185 ml   Filed Weights   09/13/17 0730 09/14/17 0831 09/21/17 0400  Weight: 117.4 kg (258 lb 13.1 oz) 115.8 kg (255 lb 4.7 oz) 111.1 kg (244 lb 14.9 oz)    Examination:  General exam: No acute distress. Awake and alert NECK - No JVD Respiratory system: Clear to auscultation anteriorly Cardiovascular system: S1 & S2. Neuro - Awake and alert Extremities - No leg edema.  Data Reviewed: I have personally reviewed following labs and imaging studies  CBC: Recent Labs  Lab 10/05/17 0539 10/08/17 0610  WBC 8.4 10.2  NEUTROABS 4.3 5.5  HGB 11.9* 11.7*  HCT 37.2 35.9*  MCV 93.2 92.8  PLT 505* 993*   Basic Metabolic Panel: Recent Labs  Lab 10/03/17 0911 10/04/17 0539 10/05/17 0539 10/08/17 0610  NA 132* 133* 133* 133*  K 3.7 4.0 3.8 3.7  CL 95* 95* 95* 96*  CO2 27 29 29 28   GLUCOSE 161* 104* 105* 152*  BUN 6 8 6 8   CREATININE 0.73 0.60 0.65 0.71  CALCIUM 9.0 9.2 9.3 9.1  MG 1.8 1.9 1.9 1.8   GFR: Estimated Creatinine Clearance: 108.6 mL/min (by C-G formula based on SCr of 0.71 mg/dL). Liver Function Tests: Recent Labs  Lab 10/05/17 0539 10/08/17 0610  AST 40 39  ALT 20 20  ALKPHOS 193* 164*  BILITOT 1.3* 1.0  PROT 8.8* 8.1  ALBUMIN 2.8* 2.7*   No results for input(s): LIPASE, AMYLASE in the last 168 hours. No results for input(s): AMMONIA in the last 168 hours. Coagulation Profile: No results for input(s): INR, PROTIME in the last 168 hours. Cardiac  Enzymes: No results for input(s): CKTOTAL, CKMB, CKMBINDEX, TROPONINI in the last 168 hours. BNP (last 3 results) No results for input(s): PROBNP in the last 8760 hours. HbA1C: No results for input(s): HGBA1C in the last 72 hours. CBG: No results for input(s): GLUCAP in the last 168 hours. Lipid Profile: No results for input(s): CHOL, HDL, LDLCALC, TRIG, CHOLHDL, LDLDIRECT in the last 72 hours. Thyroid Function Tests: No results for input(s): TSH, T4TOTAL, FREET4, T3FREE, THYROIDAB in the last 72 hours. Anemia Panel: No results for input(s): VITAMINB12, FOLATE, FERRITIN, TIBC, IRON, RETICCTPCT in the last 72 hours. Sepsis Labs: No results for input(s): PROCALCITON, LATICACIDVEN in the last 168 hours.  No results found for this or any previous visit (from the past 240 hour(s)).       Radiology Studies: No results found.      Scheduled Meds: . Chlorhexidine Gluconate Cloth  6 each Topical Q2200  . DULoxetine  60 mg Oral Daily  . heparin injection (subcutaneous)  5,000 Units Subcutaneous Q8H  . oxyCODONE  10 mg Oral Q12H  . rifampin  300 mg Oral Q12H  . senna-docusate  1  tablet Oral BID  . sodium chloride flush  10-40 mL Intracatheter Q12H   Continuous Infusions: .  ceFAZolin (ANCEF) IV Stopped (10/09/17 1501)     LOS: 28 days        Bonnell Public, MD [facility] Pager [phone]  If 7PM-7AM, please contact night-coverage [email or web address] Password [email or web address] 10/09/2017, 5:21 PM

## 2017-10-10 NOTE — Progress Notes (Signed)
Patient ID: [name], female   DOB: [birth year], 51 y.o.   MRN: [id number]  PROGRESS NOTE    TASHARA SUDER  HOO:[id number] DOB: 03-15-1966 DOA: 09/11/2017 PCP: Marliss Coots, NP   Brief Narrative:  51 year old female with history of PTSD, depression, intravenous drug use, chronic back pain status post previous back surgery, ADD, bilateral ovarian cysts, PID, sciatica presented on 09/11/2017 with severe back and neck pain.  MRI of the T and L-spine revealed ventral epidural abscess.  She was found to have MSSA bacteremia.  She was put on IV antibiotics.  ID and neurosurgery were consulted.  Patient was transferred to [facility].  She developed urinary retention on 09/15/2017 for which she required emergent C3-C7 laminectomy for decompression of spinal cord and debridement of epidural abscess.  She had TEE on 09/20/2017 which was negative for vegetations.  ID recommends at least 6 weeks of IV antibiotics.  12/16   /18 - Patient seen. No new complaints. The plan is for patient to complete course of antibiotics. Patient needs to desist from illicit drug use.   Assessment & Plan:   Principal Problem:   Severe sepsis ([place]) Active Problems:   Hypokalemia   Lymphadenopathy   Back pain   Hyponatremia   Right ovarian cyst   Hypotension   MSSA bacteremia   Abscess in epidural space of thoracic spine   Mediastinal mass   Obesity, Class III, BMI 40-49.9 (morbid obesity) (HCC)   Leucocytosis   Neck pain   Abscess in epidural space of cervical spine   Severe sepsis -Resolved.  Currently hemodynamically stable  MSSA bacteremia -Currently on cefazolin and rifampin as per ID recommendations.  ID recommends at least 6 weeks of antibiotics as per the last note from Dr. Johnnye Sima on 09/27/2017.  Patient will remain inpatient while on intravenous antibiotics. 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    Polysubstance abuse/intravenous drug use -Patient cannot be discharged with IV access  Chronic pain syndrome -Continue with Cymbalta, methocarbamol, OxyContin and oxycodone immediate release -Avoid intravenous narcotics  Thrombocytosis -Probably reactive.  Monitor intermittently -Patient was last seen by Dr. Lurline Del on 11/18 for R/O lymphoma -Thrombocytosis improving  -12/1 PBS; thrombocytosis and reactive appearing lymphocytes   Morbid obesity -Follow nutrition consult recommendations  Hypokalemia -Resolved  Hepatitis C -Outpatient follow-up with ID  Diarrhea -Resolved   DVT prophylaxis: Subcutaneous heparin Code Status: Full Family Communication: None at bedside Disposition Plan: To remain inpatient to complete intravenous antibiotics  Consultants:  Hematology/ Oncology ID Neurosurgery Cardiology   Procedures:  emergent C3-C7 laminectomy for decompression of spinal cord and debridement of epidural abscess on 09/15/2017  TEE on 09/20/2017: No evidence of vegetations  Echo on 09/12/2017 Study Conclusions  - Left ventricle: The cavity size was normal. Wall thickness was   normal. Systolic function was normal. The estimated ejection   fraction was in the range of 55% to 60%. Wall motion was normal;   there were no regional wall motion abnormalities. Left   ventricular diastolic function parameters were normal. - Mitral valve: There was mild regurgitation. - Pulmonary arteries: Systolic pressure was mildly increased. PA   peak pressure: 39 mm Hg (S).  Antimicrobials:  Nafcillin 11/19 -12/3 Rifampin 11/22 > Cefazolin 11/18 >  11/19; 12/3> Cefepime 09/11/2017 >09/12/2017 Vancomycin 11/17   Subjective: No new complaints. No fever or  chills.  Objective: Vitals:   10/09/17 2347 10/10/17 0333 10/10/17 0811 10/10/17 1212  BP: 123/82 105/75 105/67 (!) 121/93  Pulse: 72 65 78 (!) 102  Resp:   16 16  Temp: 98.1 F (36.7 C) 98 F (36.7 C) 98.4 F (36.9 C) 98.9 F (37.2 C)  TempSrc: Oral Oral Oral Oral  SpO2: 97% 95% 96% 98%  Weight:      Height:        Intake/Output Summary (Last 24 hours) at 10/10/2017 1850 Last data filed at 10/10/2017 1517 Gross per 24 hour  Intake 310 ml  Output -  Net 310 ml   Filed Weights   09/13/17 0730 09/14/17 0831 09/21/17 0400  Weight: 117.4 kg (258 lb 13.1 oz) 115.8 kg (255 lb 4.7 oz) 111.1 kg (244 lb 14.9 oz)    Examination:  General exam: No acute distress. Awake and alert NECK - No JVD Respiratory system: Clear to auscultation anteriorly Cardiovascular system: S1 & S2. Neuro - Awake and alert Extremities - No leg edema.  Data Reviewed: I have personally reviewed following labs and imaging studies  CBC: Recent Labs  Lab 10/05/17 0539 10/08/17 0610  WBC 8.4 10.2  NEUTROABS 4.3 5.5  HGB 11.9* 11.7*  HCT 37.2 35.9*  MCV 93.2 92.8  PLT 505* 425*   Basic Metabolic Panel: Recent Labs  Lab 10/04/17 0539 10/05/17 0539 10/08/17 0610  NA 133* 133* 133*  K 4.0 3.8 3.7  CL 95* 95* 96*  CO2 29 29 28   GLUCOSE 104* 105* 152*  BUN 8 6 8   CREATININE 0.60 0.65 0.71  CALCIUM 9.2 9.3 9.1  MG 1.9 1.9 1.8   GFR: Estimated Creatinine Clearance: 108.6 mL/min (by C-G formula based on SCr of 0.71 mg/dL). Liver Function Tests: Recent Labs  Lab 10/05/17 0539 10/08/17 0610  AST 40 39  ALT 20 20  ALKPHOS 193* 164*  BILITOT 1.3* 1.0  PROT 8.8* 8.1  ALBUMIN 2.8* 2.7*   No results for input(s): LIPASE, AMYLASE in the last 168 hours. No results for input(s): AMMONIA in the last 168 hours. Coagulation Profile: No results for input(s): INR, PROTIME in the last 168 hours. Cardiac Enzymes: No results for input(s): CKTOTAL, CKMB, CKMBINDEX, TROPONINI in the last 168  hours. BNP (last 3 results) No results for input(s): PROBNP in the last 8760 hours. HbA1C: No results for input(s): HGBA1C in the last 72 hours. CBG: No results for input(s): GLUCAP in the last 168 hours. Lipid Profile: No results for input(s): CHOL, HDL, LDLCALC, TRIG, CHOLHDL, LDLDIRECT in the last 72 hours. Thyroid Function Tests: No results for input(s): TSH, T4TOTAL, FREET4, T3FREE, THYROIDAB in the last 72 hours. Anemia Panel: No results for input(s): VITAMINB12, FOLATE, FERRITIN, TIBC, IRON, RETICCTPCT in the last 72 hours. Sepsis Labs: No results for input(s): PROCALCITON, LATICACIDVEN in the last 168 hours.  No results found for this or any previous visit (from the past 240 hour(s)).       Radiology Studies: No results found.      Scheduled Meds: . Chlorhexidine Gluconate Cloth  6 each Topical Q2200  . DULoxetine  60 mg Oral Daily  . heparin injection (subcutaneous)  5,000 Units Subcutaneous Q8H  . oxyCODONE  10 mg Oral Q12H  . rifampin  300 mg Oral Q12H  . senna-docusate  1 tablet Oral BID  . sodium chloride flush  10-40 mL Intracatheter Q12H  Continuous Infusions: .  ceFAZolin (ANCEF) IV Stopped (10/10/17 1517)     LOS: 29 days        Bonnell Public, MD [facility] Pager [phone]  If 7PM-7AM, please contact night-coverage [email or web address] Password [email or web address] 10/10/2017, 6:50 PM

## 2017-10-11 NOTE — Progress Notes (Signed)
[facility]                                                                              Patient Demographics  [name], is a 51 y.o. female, DOB - 14-Nov-1965, OEU:[id number]  Admit date - 09/11/2017   Admitting Physician Bonnielee Haff, MD  Outpatient Primary MD for the patient is [name], [name], NP  Outpatient specialists:   LOS - 30  days   Medical records reviewed and are as summarized below:    Chief Complaint  Patient presents with  . Neck Pain  . Back Pain       Brief summary   51 year old female with history of PTSD, depression, intravenous drug use, chronic back pain status post previous back surgery, ADD, bilateral ovarian cysts, PID, sciatica presented on 09/11/2017 with severe back and neck pain.  MRI of the T and L-spine revealed ventral epidural abscess.  She was found to have MSSA bacteremia.  She was put on IV antibiotics.  ID and neurosurgery were consulted.  Patient was transferred to [facility].  She developed urinary retention on 09/15/2017 for which she required emergent C3-C7 laminectomy for decompression of spinal cord and debridement of epidural abscess.  She had TEE on 09/20/2017 which was negative for vegetations.  ID recommends at least 6 weeks of IV antibiotics.   Assessment & Plan    Principal Problem:   Severe sepsis ([place]) -Sepsis physiology currently resolved  Active Problems:  MSSA bacteremia -TEE negative for endocarditis on 09/20/17, blood cultures negative from 11/25 -ID following, recommended to cefazolin and rifampin, 6 weeks of antibiotics per last note from Dr. Johnnye Sima on 12/3.  Patient will remain inpatient while on IV antibiotics. 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 -No neurological deficits currently -Status post emergent C3-C7 laminectomy for decompression of spinal cord, debridement of epidural abscess on 11/21 -Continue IV cefazolin,  rifampin  Polysubstance abuse/IV drug abuse -Patient to stay inpatient while on IV antibiotics and cannot be discharged with IV access/PICC line  Chronic pain syndrome -Currently stable, pain controlled, continue Cymbalta, Robaxin, oxycodone, OxyContin    Obesity, Class III, BMI 40-49.9 (morbid obesity) ([place]) -Patient counseled on diet and weight control, nutrition consult  Reactive thrombocytosis -Last seen by Dr. Jana Hakim on 11/18, improving  Hepatitis C -Outpatient follow-up with ID  Code Status: full  DVT Prophylaxis:  heparin  Family Communication: Discussed in detail with the patient, all imaging results, lab results explained to the patient   Disposition Plan:   Time Spent in minutes 35 minutes  Procedures:  emergent C3-C7 laminectomy for decompression of spinal cord and debridement of epidural abscess on 09/15/2017  TEE on 09/20/2017: No evidence of vegetations  Echo on 09/12/2017 Study Conclusions  - Left ventricle: The cavity size was normal. Wall thickness was normal. Systolic function was normal. The estimated ejection fraction was in the range of 55% to 60%. Wall motion was normal; there were no regional wall motion abnormalities. Left ventricular diastolic function parameters were normal. - Mitral valve: There was mild regurgitation. - Pulmonary arteries: Systolic pressure was mildly increased. PA peak pressure:  39 mm Hg (S    Consultants:   Hematology oncology Infectious disease Neurosurgery Cardiology  Antimicrobials:  Nafcillin 11/19 -12/3 Rifampin 11/22 > Cefazolin 11/18 > 11/19; 12/3> Cefepime 09/11/2017 >09/12/2017 Vancomycin 11/17>    Medications  Scheduled Meds: . Chlorhexidine Gluconate Cloth  6 each Topical Q2200  . DULoxetine  60 mg Oral Daily  . heparin injection (subcutaneous)  5,000 Units Subcutaneous Q8H  . oxyCODONE  10 mg Oral Q12H  . rifampin  300 mg Oral Q12H  . senna-docusate  1 tablet Oral BID  .  sodium chloride flush  10-40 mL Intracatheter Q12H   Continuous Infusions: .  ceFAZolin (ANCEF) IV 2 g (10/11/17 0634)   PRN Meds:.acetaminophen **OR** acetaminophen, hydrALAZINE, hydrOXYzine, menthol-cetylpyridinium **OR** phenol, methocarbamol, ondansetron (ZOFRAN) IV **OR** ondansetron **OR** ondansetron, oxyCODONE, polyethylene glycol, sodium chloride flush   Antibiotics   Anti-infectives (From admission, onward)   Start     Dose/Rate Route Frequency Ordered Stop   09/27/17 1800  ceFAZolin (ANCEF) IVPB 2g/100 mL premix     2 g 200 mL/hr over 30 Minutes Intravenous Every 8 hours 09/27/17 1622     09/16/17 2300  nafcillin 2 g in dextrose 5 % 100 mL IVPB  Status:  Discontinued     2 g 200 mL/hr over 30 Minutes Intravenous Every 4 hours 09/15/17 2035 09/15/17 2303   09/16/17 1400  rifampin (RIFADIN) capsule 300 mg     300 mg Oral Every 12 hours 09/16/17 1344     09/15/17 2315  nafcillin 2 g in dextrose 5 % 100 mL IVPB  Status:  Discontinued     2 g 200 mL/hr over 30 Minutes Intravenous Every 4 hours 09/15/17 2303 09/27/17 1622   09/15/17 1237  bacitracin 50,000 Units in sodium chloride irrigation 0.9 % 500 mL irrigation  Status:  Discontinued       As needed 09/15/17 1237 09/15/17 1433   09/13/17 1200  nafcillin 2 g in dextrose 5 % 100 mL IVPB  Status:  Discontinued     2 g 200 mL/hr over 30 Minutes Intravenous Every 4 hours 09/13/17 0918 09/15/17 2035   09/12/17 1400  ceFAZolin (ANCEF) IVPB 2g/100 mL premix  Status:  Discontinued     2 g 200 mL/hr over 30 Minutes Intravenous Every 8 hours 09/12/17 0848 09/13/17 0918   09/12/17 1000  vancomycin (VANCOCIN) 1,250 mg in sodium chloride 0.9 % 250 mL IVPB  Status:  Discontinued     1,250 mg 166.7 mL/hr over 90 Minutes Intravenous Every 12 hours 09/11/17 1904 09/12/17 0828   09/12/17 0600  ceFEPIme (MAXIPIME) 1 g in dextrose 5 % 50 mL IVPB  Status:  Discontinued     1 g 100 mL/hr over 30 Minutes Intravenous Every 8 hours 09/11/17 1904  09/12/17 0827   09/11/17 1830  vancomycin (VANCOCIN) 2,000 mg in sodium chloride 0.9 % 500 mL IVPB     2,000 mg 250 mL/hr over 120 Minutes Intravenous  Once 09/11/17 1758 09/12/17 0026   09/11/17 1815  ceFEPIme (MAXIPIME) 2 g in dextrose 5 % 50 mL IVPB     2 g 100 mL/hr over 30 Minutes Intravenous  Once 09/11/17 1809 09/11/17 2208   09/11/17 1800  piperacillin-tazobactam (ZOSYN) IVPB 3.375 g  Status:  Discontinued     3.375 g 12.5 mL/hr over 240 Minutes Intravenous Every 8 hours 09/11/17 1757 09/11/17 1808        Subjective:   [name] was seen and examined today.  Denies any  specific complaints, no fevers or chills.  Occasionally has back spasms otherwise no numbness or tingling or neurological deficits.  Patient denies dizziness, chest pain, shortness of breath, abdominal pain, N/V/D/C, new weakness. No acute events overnight.    Objective:   Vitals:   10/10/17 2011 10/11/17 0000 10/11/17 0300 10/11/17 0710  BP: 120/89 124/81    Pulse: 69 63    Resp: 16 16    Temp: 98.3 F (36.8 C) 97.8 F (36.6 C) 98.2 F (36.8 C) 98.1 F (36.7 C)  TempSrc: Oral Oral Oral Oral  SpO2: 97% 99%    Weight:      Height:        Intake/Output Summary (Last 24 hours) at 10/11/2017 1015 Last data filed at 10/11/2017 0835 Gross per 24 hour  Intake 700 ml  Output 751 ml  Net -51 ml     Wt Readings from Last 3 Encounters:  09/21/17 111.1 kg (244 lb 14.9 oz)  09/11/17 112.5 kg (248 lb)  09/11/17 112.5 kg (248 lb)     Exam  General: Alert and oriented x 3, NAD  Eyes:   HEENT:  Atraumatic, normocephalic  Cardiovascular: S1 S2 auscultated, no rubs, murmurs or gallops. Regular rate and rhythm.  Respiratory: Clear to auscultation bilaterally, no wheezing, rales or rhonchi  Gastrointestinal: Soft, nontender, nondistended, + bowel sounds  Ext: no pedal edema bilaterally  Neuro: no new deficits  Musculoskeletal: No digital cyanosis, clubbing  Skin: No rashes  Psych:  Normal affect and demeanor, alert and oriented x3    Data Reviewed:  I have personally reviewed following labs and imaging studies  Micro Results No results found for this or any previous visit (from the past 240 hour(s)).  Radiology Reports Dg Cervical Spine 2-3 Views  Result Date: 09/15/2017 CLINICAL DATA:  Posterior cervical fusion EXAM: DG C-ARM 61-120 MIN; CERVICAL SPINE - 2-3 VIEW COMPARISON:  None. FLUOROSCOPY TIME:  Fluoroscopy Time:  5 seconds Radiation Exposure Index (if provided by the fluoroscopic device): Available Number of Acquired Spot Images: 2 FINDINGS: Bilateral posterior cervical fusion is noted extending from C3 to what appears to be C6 on the frontal film on the left and C4-C6 on the right. The inferior aspect of the hardware on the lateral projection is poorly visualized. IMPRESSION: Posterior cervical fusion. Electronically Signed   By: Inez Catalina M.D.   On: 09/15/2017 14:07   Ct Angio Chest Pe W Or Wo Contrast  Result Date: 09/30/2017 CLINICAL DATA:  51 y/o F; shortness of breath, PE suspected, high pretest probability. EXAM: CT ANGIOGRAPHY CHEST WITH CONTRAST TECHNIQUE: Multidetector CT imaging of the chest was performed using the standard protocol during bolus administration of intravenous contrast. Multiplanar CT image reconstructions and MIPs were obtained to evaluate the vascular anatomy. CONTRAST:  53 cc Isovue 370 COMPARISON:  09/11/2017 CT of the chest. 09/01/2017 thoracic spine MRI. FINDINGS: Cardiovascular: Satisfactory opacification of the pulmonary arteries to the segmental level. No evidence of pulmonary embolism. Normal heart size. No pericardial effusion. Mediastinum/Nodes: Prominent mediastinal lymph nodes are likely reactive. Normal thyroid gland. Normal thoracic esophagus. Right central venous catheter tip extends to the lower SVC. Lungs/Pleura: Small left pleural effusion. Upper Abdomen: No acute abnormality. Musculoskeletal: Paravertebral fluid  collection predominantly on the right aspect of vertebral bodies and with a smaller component anterior and left lateral to the vertebral bodies extends from the T3-4 level to the T8 level and demonstrates a faint rim of peripheral enhancement (series 5, image 50 and series 8, image  77). There are faint lucent changes within the T7-8 endplates which may represent discitis osteomyelitis. Review of the MIP images confirms the above findings. IMPRESSION: 1. No pulmonary embolus identified. 2. Paravertebral rim enhancing collection extending from T3-4 to the T8 level, greater on the right, compatible with abscess, is decreased in its craniocaudal extent from prior thoracic MRI. 3. Faint lucency within T7-8 anterior endplates may represent developing osteomyelitis. No gross bony destructive changes. 4. Small left pleural effusion. Electronically Signed   By: Kristine Garbe M.D.   On: 09/30/2017 22:15   Mr Cervical Spine W Wo Contrast  Result Date: 09/14/2017 CLINICAL DATA:  History of IV drug abuse presenting with severe back pain status post recent back surgery. MRI of the thoracic and lumbar spine showed epidural abscess extending into the cervical spine. EXAM: MRI CERVICAL SPINE WITHOUT AND WITH CONTRAST TECHNIQUE: Multiplanar and multiecho pulse sequences of the cervical spine, to include the craniocervical junction and cervicothoracic junction, were obtained without and with intravenous contrast. CONTRAST:  92m MULTIHANCE GADOBENATE DIMEGLUMINE 529 MG/ML IV SOLN COMPARISON:  None. FINDINGS: Despite efforts by the technologist and patient, motion artifact is present on today's exam and could not be eliminated. This reduces exam sensitivity and specificity. Alignment: Reversal of the normal cervical lordosis centered at C4-C5. Vertebrae: Large right facet joint effusion at C2-C3 with surrounding periarticular soft tissue inflammatory changes. Small right facet joint effusion at C3-C4. No fracture,  evidence of discitis, or bone lesion. Cord: Again seen is a ventral epidural fluid collection extending from C7 into the thoracic spine below the field of view. At the level of C7-T1, this fluid collection appears to wrap around into the posterior epidural space, where it continues superiorly to the level of C2, measuring up to 5 mm in AP dimension. The epidural fluid collection also involves the right lateral epidural space at C2. The spinal cord is displaced anteriorly from C2 through C6, with severe narrowing of thecal sac. No spinal cord edema. Posterior Fossa, vertebral arteries, paraspinal tissues: Nonspecific suboccipital and posterior upper cervical paraspinal soft tissue edema. Otherwise negative. Disc levels: C2-C3: Bilateral facet arthropathy. No spinal canal or neuroforaminal stenosis. C3-C4: Right facet uncovertebral hypertrophy resulting in mild right neuroforaminal stenosis. C4-C5: Posterior disc osteophyte complex and bilateral uncovertebral hypertrophy resulting in mild central spinal canal stenosis and mild bilateral neuroforaminal stenosis. C5-C6: Posterior disc osteophyte complex and bilateral facet uncovertebral hypertrophy resulting in moderate left and mild right neuroforaminal stenosis. C6-C7: Left paracentral and foraminal disc protrusion resulting in mild narrowing of left aspect of the spinal canal and severe left neuroforaminal stenosis. Moderate right neuroforaminal stenosis due to uncovertebral hypertrophy. C7-T1:  Negative. IMPRESSION: 1. Epidural abscess extending from C2 into the thoracic spine. At the C7 level, the ventral epidural abscess seen on prior thoracic spine MRI wraps around into the posterior epidural space, continuing superiorly to the level of C2. There is anterior displacement and slight deformity of the cervical cord without definite evidence of cord edema, although evaluation is extremely limited by motion artifact. 2. Large right facet joint effusion at C2-C3 with  surrounding periarticular soft tissue inflammatory changes, concerning for septic arthritis. Small facet joint effusion on the right at C3-C4 may also reflect septic arthritis. 3. Suboccipital and posterior upper cervical paraspinal soft tissue inflammatory changes. 4. Severe left neuroforaminal stenosis at C6-C7 due to paracentral/left foraminal disc protrusion and uncovertebral hypertrophy. Electronically Signed   By: WTitus DubinM.D.   On: 09/14/2017 13:38   Mr Thoracic Spine W  Wo Contrast  Result Date: 09/11/2017 CLINICAL DATA:  Acute thoracic back pain. Elevated white count, CRP, ESR. EXAM: MRI THORACIC AND LUMBAR SPINE WITHOUT AND WITH CONTRAST TECHNIQUE: Multiplanar and multiecho pulse sequences of the thoracic and lumbar spine were obtained without and with intravenous contrast. CONTRAST:  21m MULTIHANCE GADOBENATE DIMEGLUMINE 529 MG/ML IV SOLN COMPARISON:  Chest CT from earlier today. FINDINGS: MRI THORACIC SPINE FINDINGS Alignment:  Normal Vertebrae: There is subtle anterior corner T2 hyperintensity at T7-8 and T8-9. The anterior T7-8 disc is subtly T2 hyperintense. Ill-defined enhancing soft tissue intensity from T6-T9, maximal at the T7-8 level where there is hypoenhancing pockets about the disc space which may reflect collections. Also at T7-8 disc is ventral epidural thickening that extends cranially with fluid collection in the ventral epidural space reaching C7. This ventral epidural collection measures up to 5-6 mm AP. 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 No cord edema. This degree of paravertebral inflammation with limited disc involvement raises the possibility of the tuberculous or other atypical infection, but the abscess and clinical symptomatology favors pyogenic infection. Paravertebral thrombophlebitis is considered in addition to primary discitis. Cord:  As above Paraspinal and other soft tissues:  Phlegmon as above Disc levels: No notable degenerative changes. MRI LUMBAR SPINE FINDINGS Segmentation:  Standard. Alignment:  Mild retrolisthesis at L2-3. Vertebrae: Status post L3-4 and L5-S1 discectomy with solid arthrodesis by CT. There is L3-S1 posterior arthrodesis. No evidence of discitis or osteomyelitis. No fracture or aggressive bone lesion. Conus medullaris: Extends to the L1 level and appears normal. Paraspinal and other soft tissues: Postoperative fatty atrophy of lower intrinsic back muscles. Small volume chronic appearing fluid collections in the laminectomy defects of L3-4 and L4-5. No associated mass effect. Disc levels: T12- L1: Small central disc protrusion without impingement L1-L2: Spondylosis.  No impingement L2-L3: Degenerative disc narrowing and bulging. Facet arthropathy with gaping joints and mild listhesis. There is a down turning disc protrusion. Dorsal epidural fat expansion with moderate to advanced spinal stenosis. Bilateral noncompressive foraminal narrowing. L3-L4: Discectomy with solid arthrodesis by CT. No residual impingement L4-L5: Posterior arthrodesis.  No residual impingement. L5-S1:Discectomy with solid arthrodesis.  No residual impingement. These results were called by telephone at the time of interpretation on 09/11/2017 at 8:59 pm to PAvon who verbally acknowledged these results. IMPRESSION: Thoracic spine: Previously noted paravertebral soft tissue is best attributed to phlegmon which is maximal around T7-8 where there is likely early discitis. Extending from T7-8 to C7 is a ventral epidural collection consistent with abscess. 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 Lumbar spine: 1. No acute finding. 2. L3-S1 solid arthrodesis. 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 Electronically Signed   By: JMonte FantasiaM.D.   On: 09/11/2017 21:04   Mr Lumbar Spine W Wo Contrast  Result  Date: 09/11/2017 CLINICAL DATA:  Acute thoracic back pain. Elevated white count, CRP, ESR. EXAM: MRI THORACIC AND LUMBAR SPINE WITHOUT AND WITH CONTRAST TECHNIQUE: Multiplanar and multiecho pulse sequences of the thoracic and lumbar spine were obtained without and with intravenous contrast. CONTRAST:  247mMULTIHANCE GADOBENATE DIMEGLUMINE 529 MG/ML IV SOLN COMPARISON:  Chest CT from earlier today. FINDINGS: MRI THORACIC SPINE FINDINGS Alignment:  Normal Vertebrae: There is subtle anterior corner T2 hyperintensity at T7-8 and T8-9. The anterior T7-8 disc is subtly T2 hyperintense. Ill-defined enhancing soft tissue intensity from T6-T9, maximal  at the T7-8 level where there is hypoenhancing pockets about the disc space which may reflect collections. Also at T7-8 disc is ventral epidural thickening that extends cranially with fluid collection in the ventral epidural space reaching C7. This ventral epidural collection measures up to 5-6 mm AP. 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 No cord edema. This degree of paravertebral inflammation with limited disc involvement raises the possibility of the tuberculous or other atypical infection, but the abscess and clinical symptomatology favors pyogenic infection. Paravertebral thrombophlebitis is considered in addition to primary discitis. Cord:  As above Paraspinal and other soft tissues: Phlegmon as above Disc levels: No notable degenerative changes. MRI LUMBAR SPINE FINDINGS Segmentation:  Standard. Alignment:  Mild retrolisthesis at L2-3. Vertebrae: Status post L3-4 and L5-S1 discectomy with solid arthrodesis by CT. There is L3-S1 posterior arthrodesis. No evidence of discitis or osteomyelitis. No fracture or aggressive bone lesion. Conus medullaris: Extends to the L1 level and appears normal. Paraspinal and other soft tissues: Postoperative fatty atrophy of lower intrinsic back muscles. Small  volume chronic appearing fluid collections in the laminectomy defects of L3-4 and L4-5. No associated mass effect. Disc levels: T12- L1: Small central disc protrusion without impingement L1-L2: Spondylosis.  No impingement L2-L3: Degenerative disc narrowing and bulging. Facet arthropathy with gaping joints and mild listhesis. There is a down turning disc protrusion. Dorsal epidural fat expansion with moderate to advanced spinal stenosis. Bilateral noncompressive foraminal narrowing. L3-L4: Discectomy with solid arthrodesis by CT. No residual impingement L4-L5: Posterior arthrodesis.  No residual impingement. L5-S1:Discectomy with solid arthrodesis.  No residual impingement. These results were called by telephone at the time of interpretation on 09/11/2017 at 8:59 pm to [place], who verbally acknowledged these results. IMPRESSION: Thoracic spine: Previously noted paravertebral soft tissue is best attributed to phlegmon which is maximal around T7-8 where there is likely early discitis. Extending from T7-8 to C7 is a ventral epidural collection consistent with abscess. 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 Lumbar spine: 1. No acute finding. 2. L3-S1 solid arthrodesis. 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 Electronically Signed   By: Monte Fantasia M.D.   On: 09/11/2017 21:04   Ct Abdomen Pelvis W Contrast  Result Date: 09/21/2017 CLINICAL DATA:  Bacteremia. Known thoracic and cervical abscess with cervical decompression 6 days ago. EXAM: CT ABDOMEN AND PELVIS WITH CONTRAST TECHNIQUE: Multidetector CT imaging of the abdomen and pelvis was performed using the standard protocol following bolus administration of intravenous contrast. CONTRAST:  100 cc Isovue-300 intravenous COMPARISON:  09/11/2017 FINDINGS: Lower chest: Progression of posterior mediastinal disease centered at T7 and T8 with discrete paravertebral abscesses  bilaterally and ventrally. The collection on the right (which may communicate to the left) measures up to 21 x 41 mm on axial slices. Small bilateral pleural effusion that are dependent and without visible septation or pleural thickening. Lower lobe atelectasis. Hepatobiliary: No focal liver abnormality.No evidence of biliary obstruction or stone. Pancreas: Unremarkable. Spleen: Unremarkable. Adrenals/Urinary Tract: Negative adrenals. No hydronephrosis or stone. Subcentimeter presumed cyst in the left kidney. Gas in the urinary bladder without bladder wall thickening or inflammation. Stomach/Bowel:  No obstruction. No appendicitis. Vascular/Lymphatic: No acute vascular abnormality. No mass or adenopathy. Reproductive:Hysterectomy. Tubular cystic structure in the right pelvis measuring up to 2 cm in thickness, length measurement limited by redundant shape. This is the appearance of hydrosalpinx. No wall  thickening or surrounding fat inflammation. Other: No ascites or pneumoperitoneum. Musculoskeletal: No progressive or visible destructive changes in the thoracic spine. L3-S1 solid fusion. Known epidural abscess in the thoracic spine not well assessed by CT. IMPRESSION: 1. Progressive paravertebral infection at T7 and T8 with collections/abscess measuring up to 4 cm. No osteomyelitis by CT. 2. Small bilateral pleural effusions without visible complexity. Lower lobe atelectasis. 3. No abscess or other acute finding in the abdomen. 4. Right hydrosalpinx. Recommend pelvic ultrasound confirmation after convalescence. 5. Gas in the urinary bladder without wall thickening, please correlate with urinalysis. Electronically Signed   By: Monte Fantasia M.D.   On: 09/21/2017 21:12   Dg C-arm 1-60 Min  Result Date: 09/15/2017 CLINICAL DATA:  Posterior cervical fusion EXAM: DG C-ARM 61-120 MIN; CERVICAL SPINE - 2-3 VIEW COMPARISON:  None. FLUOROSCOPY TIME:  Fluoroscopy Time:  5 seconds Radiation Exposure Index (if provided  by the fluoroscopic device): Available Number of Acquired Spot Images: 2 FINDINGS: Bilateral posterior cervical fusion is noted extending from C3 to what appears to be C6 on the frontal film on the left and C4-C6 on the right. The inferior aspect of the hardware on the lateral projection is poorly visualized. IMPRESSION: Posterior cervical fusion. Electronically Signed   By: Inez Catalina M.D.   On: 09/15/2017 14:07    Lab Data:  CBC: Recent Labs  Lab 10/05/17 0539 10/08/17 0610  WBC 8.4 10.2  NEUTROABS 4.3 5.5  HGB 11.9* 11.7*  HCT 37.2 35.9*  MCV 93.2 92.8  PLT 505* 696*   Basic Metabolic Panel: Recent Labs  Lab 10/05/17 0539 10/08/17 0610  NA 133* 133*  K 3.8 3.7  CL 95* 96*  CO2 29 28  GLUCOSE 105* 152*  BUN 6 8  CREATININE 0.65 0.71  CALCIUM 9.3 9.1  MG 1.9 1.8   GFR: Estimated Creatinine Clearance: 108.6 mL/min (by C-G formula based on SCr of 0.71 mg/dL). Liver Function Tests: Recent Labs  Lab 10/05/17 0539 10/08/17 0610  AST 40 39  ALT 20 20  ALKPHOS 193* 164*  BILITOT 1.3* 1.0  PROT 8.8* 8.1  ALBUMIN 2.8* 2.7*   No results for input(s): LIPASE, AMYLASE in the last 168 hours. No results for input(s): AMMONIA in the last 168 hours. Coagulation Profile: No results for input(s): INR, PROTIME in the last 168 hours. Cardiac Enzymes: No results for input(s): CKTOTAL, CKMB, CKMBINDEX, TROPONINI in the last 168 hours. BNP (last 3 results) No results for input(s): PROBNP in the last 8760 hours. HbA1C: No results for input(s): HGBA1C in the last 72 hours. CBG: No results for input(s): GLUCAP in the last 168 hours. Lipid Profile: No results for input(s): CHOL, HDL, LDLCALC, TRIG, CHOLHDL, LDLDIRECT in the last 72 hours. Thyroid Function Tests: No results for input(s): TSH, T4TOTAL, FREET4, T3FREE, THYROIDAB in the last 72 hours. Anemia Panel: No results for input(s): VITAMINB12, FOLATE, FERRITIN, TIBC, IRON, RETICCTPCT in the last 72 hours. Urine analysis:     Component Value Date/Time   COLORURINE YELLOW 09/11/2017 0709   APPEARANCEUR HAZY (A) 09/11/2017 0709   LABSPEC 1.020 09/11/2017 0709   PHURINE 6.0 09/11/2017 0709   GLUCOSEU NEGATIVE 09/11/2017 0709   HGBUR SMALL (A) 09/11/2017 0709   BILIRUBINUR NEGATIVE 09/11/2017 0709   KETONESUR NEGATIVE 09/11/2017 0709   PROTEINUR NEGATIVE 09/11/2017 0709   UROBILINOGEN 0.2 06/27/2015 0225   NITRITE NEGATIVE 09/11/2017 0709   LEUKOCYTESUR LARGE (A) 09/11/2017 0709     Ripudeep Rai M.D. [facility] 10/11/2017, 10:15 AM  Pager: [phone] Between 7am to 7pm - call Pager - [phone]  After 7pm go to [email or web address] - password [email or web address]  Call night coverage person covering after 7pm

## 2017-10-11 NOTE — Progress Notes (Signed)
Physical Therapy Treatment/ Discharge Patient Details Name: [name] MRN: [id number] DOB: 12/18/1965 Today's Date: 10/11/2017    History of Present Illness 51-year-old female w/ a history of IV drug use, hep C and chronic back pain status post previous back surgery who presented to the ED with severe back and neck pain.  An MRI of the T and L-spine revealed a ventral epidural abscess C2-t8. 1/21 Pt with emergent C3-7 lami and epidural abscess debridement    PT Comments    Pt moving well, walking halls without assist throughout the day. She is able to don/doff collar and perform all transfers without assist. All education complete and pt safe for D/C from therapy services with recommendation to continue mobility. Pt aware and agreeable.    Follow Up Recommendations  No PT follow up     Equipment Recommendations  None recommended by PT    Recommendations for Other Services       Precautions / Restrictions Precautions Precautions: Cervical Required Braces or Orthoses: Cervical Brace Cervical Brace: Hard collar Restrictions Other Position/Activity Restrictions: Pt independent with don/doff brace.    Mobility  Bed Mobility Overal bed mobility: Modified Independent                Transfers Overall transfer level: Modified independent                  Ambulation/Gait Ambulation/Gait assistance: Modified independent (Device/Increase time) Ambulation Distance (Feet): 700 Feet Assistive device: None Gait Pattern/deviations: Step-through pattern;Decreased stride length     General Gait Details: pt walked 200' then 700' with LOB only when looking right, pt aware and able to self correct LOB.    Stairs            Wheelchair Mobility    Modified Rankin (Stroke Patients Only)       Balance Overall balance assessment: Needs assistance   Sitting balance-Leahy Scale: Normal       Standing balance-Leahy Scale: Good                              Cognition Arousal/Alertness: Awake/alert Behavior During Therapy: WFL for tasks assessed/performed Overall Cognitive Status: Within Functional Limits for tasks assessed                                        Exercises      General Comments        Pertinent Vitals/Pain Pain Score: 7  Pain Location: neck Pain Descriptors / Indicators: Sore Pain Intervention(s): Limited activity within patient's tolerance;Repositioned;Patient requesting pain meds-RN notified;RN gave pain meds during session    Home Living                      Prior Function            PT Goals (current goals can now be found in the care plan section) Progress towards PT goals: Goals met/education completed, patient discharged from PT    Frequency    Min 2X/week      PT Plan Current plan remains appropriate    Co-evaluation              AM-PAC PT "6 Clicks" Daily Activity  Outcome Measure  Difficulty turning over in bed (including adjusting bedclothes, sheets and blankets)?: None Difficulty moving from lying on back to   sitting on the side of the bed? : None Difficulty sitting down on and standing up from a chair with arms (e.g., wheelchair, bedside commode, etc,.)?: None Help needed moving to and from a bed to chair (including a wheelchair)?: None Help needed walking in hospital room?: None Help needed climbing 3-5 steps with a railing? : None 6 Click Score: 24    End of Session Equipment Utilized During Treatment: Cervical collar Activity Tolerance: Patient tolerated treatment well Patient left: Other (comment)(in room with RN) Nurse Communication: Mobility status PT Visit Diagnosis: Other abnormalities of gait and mobility (R26.89)     Time: 1610-9604 PT Time Calculation (min) (ACUTE ONLY): 19 min  Charges:  $Gait Training: 8-22 mins                    G Codes:       Elwyn Reach, PT [phone]    [place] 10/11/2017,  12:59 PM

## 2017-10-12 DIAGNOSIS — R609 Edema, unspecified: Secondary | ICD-10-CM

## 2017-10-12 DIAGNOSIS — D72829 Elevated white blood cell count, unspecified: Secondary | ICD-10-CM

## 2017-10-12 DIAGNOSIS — D649 Anemia, unspecified: Secondary | ICD-10-CM

## 2017-10-12 LAB — CBC WITH DIFFERENTIAL/PLATELET
BASOS PCT: 1 %
Basophils Absolute: 0.1 10*3/uL (ref 0.0–0.1)
EOS ABS: 0.8 10*3/uL — AB (ref 0.0–0.7)
EOS PCT: 10 %
HCT: 35.5 % — ABNORMAL LOW (ref 36.0–46.0)
Hemoglobin: 11.4 g/dL — ABNORMAL LOW (ref 12.0–15.0)
LYMPHS ABS: 2.2 10*3/uL (ref 0.7–4.0)
Lymphocytes Relative: 28 %
MCH: 30.1 pg (ref 26.0–34.0)
MCHC: 32.1 g/dL (ref 30.0–36.0)
MCV: 93.7 fL (ref 78.0–100.0)
MONO ABS: 0.5 10*3/uL (ref 0.1–1.0)
MONOS PCT: 6 %
Neutro Abs: 4.3 10*3/uL (ref 1.7–7.7)
Neutrophils Relative %: 55 %
Platelets: 428 10*3/uL — ABNORMAL HIGH (ref 150–400)
RBC: 3.79 MIL/uL — ABNORMAL LOW (ref 3.87–5.11)
RDW: 14.9 % (ref 11.5–15.5)
WBC: 7.8 10*3/uL (ref 4.0–10.5)

## 2017-10-12 LAB — COMPREHENSIVE METABOLIC PANEL
ALBUMIN: 2.8 g/dL — AB (ref 3.5–5.0)
ALT: 20 U/L (ref 14–54)
ANION GAP: 9 (ref 5–15)
AST: 38 U/L (ref 15–41)
Alkaline Phosphatase: 155 U/L — ABNORMAL HIGH (ref 38–126)
BUN: 7 mg/dL (ref 6–20)
CALCIUM: 8.9 mg/dL (ref 8.9–10.3)
CO2: 28 mmol/L (ref 22–32)
Chloride: 98 mmol/L — ABNORMAL LOW (ref 101–111)
Creatinine, Ser: 0.65 mg/dL (ref 0.44–1.00)
GFR calc non Af Amer: >60 mL/min (ref >60)
GLUCOSE: 157 mg/dL — AB (ref 65–99)
POTASSIUM: 3.7 mmol/L (ref 3.5–5.1)
SODIUM: 135 mmol/L (ref 135–145)
TOTAL PROTEIN: 8.1 g/dL (ref 6.5–8.1)
Total Bilirubin: 0.8 mg/dL (ref 0.3–1.2)

## 2017-10-12 LAB — PHOSPHORUS: PHOSPHORUS: 4.4 mg/dL (ref 2.5–4.6)

## 2017-10-12 LAB — C-REACTIVE PROTEIN: CRP: 1.2 mg/dL — AB (ref <1.0)

## 2017-10-12 LAB — MAGNESIUM: Magnesium: 1.7 mg/dL (ref 1.7–2.4)

## 2017-10-12 NOTE — Progress Notes (Signed)
PROGRESS NOTE    [name]  YTK:[id number] DOB: July 21, 1966 DOA: 09/11/2017 PCP: Marliss Coots, NP  Brief Narrative:  51 year old female with history of PTSD, depression, intravenous drug use, chronic back pain status post previous back surgery, ADD, bilateral ovarian cysts, PID, sciatica presented on 09/11/2017 with severe back and neck pain. MRI of the T and L-spine revealed ventral epidural abscess. She was found to have MSSA bacteremia. She was put on IV antibiotics. ID and neurosurgery were consulted. Patient was transferred to [facility]. She developed urinary retention on 09/15/2017 for which she required emergent C3-C7 laminectomy for decompression of spinal cord and debridement of epidural abscess. She had TEE on 09/20/2017 which was negative for vegetations. ID recommends at least 6 weeks of IV antibiotics and last negative Blood Cx were 09/19/17  Assessment & Plan:   Principal Problem:   Severe sepsis (HCC) Active Problems:   Hypokalemia   Lymphadenopathy   Back pain   Hyponatremia   Right ovarian cyst   Hypotension   MSSA bacteremia   Abscess in epidural space of thoracic spine   Mediastinal mass   Obesity, Class III, BMI 40-49.9 (morbid obesity) (HCC)   Leucocytosis   Neck pain   Abscess in epidural space of cervical spine  Severe Sepsis in the setting of MSSA Bacteremia and Abscesses in the Cervical and Thoracic Spine  -Sepsis physiology currently resolved -WBC was 7.8 today' -CRP improved from 2.6 -> 1.2 -Repeat CBC intermittently  -Patient is Hemodynamically stable   MSSA Bacteremia -TEE negative for endocarditis on 09/20/17, blood cultures negative from 11/25 -ID following, recommended to Cefazolin and Rifampin, 6 weeks of antibiotics per last note from Dr. Johnnye Sima on 12/3.   -Negative Blood Cx from 09/19/17 -Patient will remain inpatient while on IV antibiotics. 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   Polysubstance Abuse/IV drug abuse -Patient to stay inpatient while on IV antibiotics and cannot be discharged with IV access/PICC line  Chronic Pain Syndrome -Currently Stable and pain controlled -Continue Duloxetine 60 mg po Daily, Methocarbamol 750 mg po q8hprn, Oxycodone 10 mg q6hprn Moderate Pain, and OxyContin 10 mg po q12h -Bowel Regimen with Senna-Docusate 1 tab po BID and Miralax 17 grams po Dailyprn  -Avoid IV Narcotics   Obesity, Class III, BMI 40-49.9 (Morbid obesity) ([place]) -Patient counseled on diet and weight control  -Nutrition consult  Reactive Thrombocytosis -Last seen by Dr. Jana Hakim on 11/18 to r/o Lymphoma  -Platelet Count improved went from 972 -> 428 -Repeat Blood work Intermittently   Hepatitis C -AST and ALT wnL -Hep C Genotype was 1a and HCV Quantitative was 2,240,000 and HCV Quantitave Log was 6.350 -Outpatient follow-up with ID once IV Abx Infection is complete   Normocytic Iron Deficiency Anemia -Iron Level was 24, UIBC 208, TIBC 232, Saturation Ratios 10, Ferritin 193, and Folate 12.5; Vitamin B12 was 679 -Stable. Hb/Hct now 11.4/35.5 -Continue to Monitor for S/Sx of Bleeding -Repeat CBC intermittently  Hyponatremia -Improved. Na+ was 135 -Continue to Monitor intermittently   DVT prophylaxis: Heparin 5,000 sq q8h Code Status: FULL CODE Family Communication: No family present at bedside Disposition Plan: Home when IV Abx Treatment is completed as PT recommended no follow up   Consultants:  Hematology/Oncology  Infectious Diseases  Neurosurgery  Cardiology   Procedures:  Emergent C3-C7 laminectomy for decompression of spinal cord and debridement  of epidural abscess on 09/15/2017  TEE on 09/20/2017: No evidence of vegetations  Transthoracic ECHO on 09/12/2017 Study Conclusions  - Left ventricle: The cavity size was normal. Wall thickness was normal. Systolic function was normal. The estimated ejection fraction was in the range of 55% to 60%. Wall motion was normal; there were no regional wall motion abnormalities. Left ventricular diastolic function parameters were normal. - Mitral valve: There was mild regurgitation. - Pulmonary arteries: Systolic pressure was mildly increased. PA peak pressure: 39 mm Hg (S).   Antimicrobials:  Anti-infectives (From admission, onward)   Start     Dose/Rate Route Frequency Ordered Stop   09/27/17 1800  ceFAZolin (ANCEF) IVPB 2g/100 mL premix     2 g 200 mL/hr over 30 Minutes Intravenous Every 8 hours 09/27/17 1622     09/16/17 2300  nafcillin 2 g in dextrose 5 % 100 mL IVPB  Status:  Discontinued     2 g 200 mL/hr over 30 Minutes Intravenous Every 4 hours 09/15/17 2035 09/15/17 2303   09/16/17 1400  rifampin (RIFADIN) capsule 300 mg     300 mg Oral Every 12 hours 09/16/17 1344     09/15/17 2315  nafcillin 2 g in dextrose 5 % 100 mL IVPB  Status:  Discontinued     2 g 200 mL/hr over 30 Minutes Intravenous Every 4 hours 09/15/17 2303 09/27/17 1622   09/15/17 1237  bacitracin 50,000 Units in sodium chloride irrigation 0.9 % 500 mL irrigation  Status:  Discontinued       As needed 09/15/17 1237 09/15/17 1433   09/13/17 1200  nafcillin 2 g in dextrose 5 % 100 mL IVPB  Status:  Discontinued     2 g 200 mL/hr over 30 Minutes Intravenous Every 4 hours 09/13/17 0918 09/15/17 2035   09/12/17 1400  ceFAZolin (ANCEF) IVPB 2g/100 mL premix  Status:  Discontinued     2 g 200 mL/hr over 30 Minutes Intravenous Every 8 hours 09/12/17 0848 09/13/17 0918   09/12/17 1000  vancomycin (VANCOCIN) 1,250 mg in sodium chloride 0.9 % 250 mL IVPB  Status:  Discontinued     1,250 mg 166.7 mL/hr over  90 Minutes Intravenous Every 12 hours 09/11/17 1904 09/12/17 0828   09/12/17 0600  ceFEPIme (MAXIPIME) 1 g in dextrose 5 % 50 mL IVPB  Status:  Discontinued     1 g 100 mL/hr over 30 Minutes Intravenous Every 8 hours 09/11/17 1904 09/12/17 0827   09/11/17 1830  vancomycin (VANCOCIN) 2,000 mg in sodium chloride 0.9 % 500 mL IVPB     2,000 mg 250 mL/hr over 120 Minutes Intravenous  Once 09/11/17 1758 09/12/17 0026   09/11/17 1815  ceFEPIme (MAXIPIME) 2 g in dextrose 5 % 50 mL IVPB     2 g 100 mL/hr over 30 Minutes Intravenous  Once 09/11/17 1809 09/11/17 2208   09/11/17 1800  piperacillin-tazobactam (ZOSYN) IVPB 3.375 g  Status:  Discontinued     3.375 g 12.5 mL/hr over 240 Minutes Intravenous Every 8 hours 09/11/17 1757 09/11/17 1808     Subjective: Seen and examined at bedside and stated she had no complaints. Did not sleep well last night because she slept most of the day. No CP or SOB. Denies any swelling in leg and states pain is controlled.   Objective: Vitals:   10/11/17 2025  10/12/17 0039 10/12/17 0413 10/12/17 0753  BP: 116/86 128/83 103/79 104/73  Pulse: 71 65 69 (!) 53  Resp: 18 18  16   Temp: 98.2 F (36.8 C) 98 F (36.7 C) 98.2 F (36.8 C) 97.9 F (36.6 C)  TempSrc: Oral Oral Oral Oral  SpO2: 99% 98% 97% 93%  Weight:      Height:        Intake/Output Summary (Last 24 hours) at 10/12/2017 0805 Last data filed at 10/12/2017 8119 Gross per 24 hour  Intake 1710 ml  Output 400 ml  Net 1310 ml   Filed Weights   09/13/17 0730 09/14/17 0831 09/21/17 0400  Weight: 117.4 kg (258 lb 13.1 oz) 115.8 kg (255 lb 4.7 oz) 111.1 kg (244 lb 14.9 oz)   Examination: Physical Exam:  Constitutional: WN/WD obese Caucasian female in NAD and appears calm Eyes: Lids and conjunctivae normal, sclerae anicteric  ENMT: External Ears, Nose appear normal. Grossly normal hearing. Mucous membranes are moist.  Neck: Appears normal, supple, no cervical masses, normal ROM, no appreciable  thyromegaly, no JVD Respiratory: Clear to auscultation bilaterally, no wheezing, rales, rhonchi or crackles. Normal respiratory effort and patient is not tachypenic. No accessory muscle use.  Cardiovascular: RRR, no murmurs / rubs / gallops. S1 and S2 auscultated. No extremity edema.   Abdomen: Soft, non-tender, non-distended. No masses palpated. No appreciable hepatosplenomegaly. Bowel sounds positive x4.  GU: Deferred. Musculoskeletal: No clubbing / cyanosis of digits/nails. No joint deformity upper and lower extremities.  Skin: No rashes, lesions, ulcers on a limited skin eval. No induration; Warm and dry.  Neurologic: CN 2-12 grossly intact with no focal deficits. Romberg sign and cerebellar reflexes not assessed.  Psychiatric: Normal judgment and insight. Alert and oriented x 3. Normal mood and appropriate affect.   Data Reviewed: I have personally reviewed following labs and imaging studies  CBC: Recent Labs  Lab 10/08/17 0610  WBC 10.2  NEUTROABS 5.5  HGB 11.7*  HCT 35.9*  MCV 92.8  PLT 147*   Basic Metabolic Panel: Recent Labs  Lab 10/08/17 0610  NA 133*  K 3.7  CL 96*  CO2 28  GLUCOSE 152*  BUN 8  CREATININE 0.71  CALCIUM 9.1  MG 1.8   GFR: Estimated Creatinine Clearance: 108.6 mL/min (by C-G formula based on SCr of 0.71 mg/dL). Liver Function Tests: Recent Labs  Lab 10/08/17 0610  AST 39  ALT 20  ALKPHOS 164*  BILITOT 1.0  PROT 8.1  ALBUMIN 2.7*   No results for input(s): LIPASE, AMYLASE in the last 168 hours. No results for input(s): AMMONIA in the last 168 hours. Coagulation Profile: No results for input(s): INR, PROTIME in the last 168 hours. Cardiac Enzymes: No results for input(s): CKTOTAL, CKMB, CKMBINDEX, TROPONINI in the last 168 hours. BNP (last 3 results) No results for input(s): PROBNP in the last 8760 hours. HbA1C: No results for input(s): HGBA1C in the last 72 hours. CBG: No results for input(s): GLUCAP in the last 168 hours. Lipid  Profile: No results for input(s): CHOL, HDL, LDLCALC, TRIG, CHOLHDL, LDLDIRECT in the last 72 hours. Thyroid Function Tests: No results for input(s): TSH, T4TOTAL, FREET4, T3FREE, THYROIDAB in the last 72 hours. Anemia Panel: No results for input(s): VITAMINB12, FOLATE, FERRITIN, TIBC, IRON, RETICCTPCT in the last 72 hours. Sepsis Labs: No results for input(s): PROCALCITON, LATICACIDVEN in the last 168 hours.  No results found for this or any previous visit (from the past 240 hour(s)).   Radiology Studies: No results  found.  Scheduled Meds: . Chlorhexidine Gluconate Cloth  6 each Topical Q2200  . DULoxetine  60 mg Oral Daily  . heparin injection (subcutaneous)  5,000 Units Subcutaneous Q8H  . oxyCODONE  10 mg Oral Q12H  . rifampin  300 mg Oral Q12H  . senna-docusate  1 tablet Oral BID  . sodium chloride flush  10-40 mL Intracatheter Q12H   Continuous Infusions: .  ceFAZolin (ANCEF) IV Stopped (10/12/17 0618)    LOS: 31 days   Kerney Elbe, DO [facility] Pager [phone]  If 7PM-7AM, please contact night-coverage [email or web address] Password [email or web address] 10/12/2017, 8:05 AM

## 2017-10-13 NOTE — Progress Notes (Signed)
PROGRESS NOTE    [name]  DPO:[id number] DOB: 03/02/1966 DOA: 09/11/2017 PCP: Marliss Coots, NP  Brief Narrative:  51 year old female with history of PTSD, depression, intravenous drug use, chronic back pain status post previous back surgery, ADD, bilateral ovarian cysts, PID, sciatica presented on 09/11/2017 with severe back and neck pain. MRI of the T and L-spine revealed ventral epidural abscess. She was found to have MSSA bacteremia. She was put on IV antibiotics. ID and neurosurgery were consulted. Patient was transferred to [facility]. She developed urinary retention on 09/15/2017 for which she required emergent C3-C7 laminectomy for decompression of spinal cord and debridement of epidural abscess. She had TEE on 09/20/2017 which was negative for vegetations. ID recommends at least 6 weeks of IV antibiotics and last negative Blood Cx were 09/19/17  Assessment & Plan:   Principal Problem:   Severe sepsis (HCC) Active Problems:   Hypokalemia   Lymphadenopathy   Back pain   Hyponatremia   Right ovarian cyst   Hypotension   MSSA bacteremia   Abscess in epidural space of thoracic spine   Mediastinal mass   Obesity, Class III, BMI 40-49.9 (morbid obesity) (HCC)   Leucocytosis   Neck pain   Abscess in epidural space of cervical spine  Severe Sepsis in the setting of MSSA Bacteremia and Abscesses in the Cervical and Thoracic Spine  -Sepsis physiology currently resolved -WBC was 7.8 today' -CRP improved from 2.6 -> 1.2 -Repeat CBC intermittently  -Patient is Hemodynamically stable   MSSA Bacteremia -TEE negative for endocarditis on 09/20/17, blood cultures negative from 11/25 -ID following, recommended to Cefazolin and Rifampin, 6 weeks of antibiotics per last note from Dr. Johnnye Sima on 12/3.   -Negative Blood Cx from 09/19/17 -Patient will remain inpatient while on IV antibiotics. 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   Polysubstance Abuse/IV drug abuse -Patient to stay inpatient while on IV antibiotics and cannot be discharged with IV access/PICC line  Chronic Pain Syndrome -Currently Stable and pain controlled -Continue Duloxetine 60 mg po Daily, Methocarbamol 750 mg po q8hprn, Oxycodone 10 mg q6hprn Moderate Pain, and OxyContin 10 mg po q12h -Bowel Regimen with Senna-Docusate 1 tab po BID and Miralax 17 grams po Dailyprn  -Avoid IV Narcotics   Obesity, Class III, BMI 40-49.9 (Morbid obesity) ([place]) -Patient counseled on diet and weight control  -Nutrition consult  Reactive Thrombocytosis -Last seen by Dr. Jana Hakim on 11/18 to r/o Lymphoma  -Platelet Count improved went from 972 -> 428 -Repeat Blood work Intermittently   Hepatitis C -AST and ALT wnL -Hep C Genotype was 1a and HCV Quantitative was 2,240,000 and HCV Quantitave Log was 6.350 -Outpatient follow-up with ID once IV Abx Infection is complete   Normocytic Iron Deficiency Anemia -Iron Level was 24, UIBC 208, TIBC 232, Saturation Ratios 10, Ferritin 193, and Folate 12.5; Vitamin B12 was 679 -Stable. Hb/Hct now 11.4/35.5 -Continue to Monitor for S/Sx of Bleeding -Repeat CBC intermittently  Hyponatremia -Improved. Na+ was 135 -Continue to Monitor intermittently   DVT prophylaxis: Heparin 5,000 sq q8h Code Status: FULL CODE Family Communication: No family present at bedside Disposition Plan: Home when IV Abx Treatment is completed as PT recommended no follow up   Consultants:  Hematology/Oncology  Infectious Diseases  Neurosurgery  Cardiology   Procedures:  Emergent C3-C7 laminectomy for decompression of spinal cord and debridement of  epidural abscess on 09/15/2017  TEE on 09/20/2017: No evidence of vegetations  Transthoracic ECHO on 09/12/2017 Study Conclusions  - Left ventricle: The cavity size was normal. Wall thickness was normal. Systolic function was normal. The estimated ejection fraction was in the range of 55% to 60%. Wall motion was normal; there were no regional wall motion abnormalities. Left ventricular diastolic function parameters were normal. - Mitral valve: There was mild regurgitation. - Pulmonary arteries: Systolic pressure was mildly increased. PA peak pressure: 39 mm Hg (S).   Antimicrobials:  Anti-infectives (From admission, onward)   Start     Dose/Rate Route Frequency Ordered Stop   09/27/17 1800  ceFAZolin (ANCEF) IVPB 2g/100 mL premix     2 g 200 mL/hr over 30 Minutes Intravenous Every 8 hours 09/27/17 1622     09/16/17 2300  nafcillin 2 g in dextrose 5 % 100 mL IVPB  Status:  Discontinued     2 g 200 mL/hr over 30 Minutes Intravenous Every 4 hours 09/15/17 2035 09/15/17 2303   09/16/17 1400  rifampin (RIFADIN) capsule 300 mg     300 mg Oral Every 12 hours 09/16/17 1344     09/15/17 2315  nafcillin 2 g in dextrose 5 % 100 mL IVPB  Status:  Discontinued     2 g 200 mL/hr over 30 Minutes Intravenous Every 4 hours 09/15/17 2303 09/27/17 1622   09/15/17 1237  bacitracin 50,000 Units in sodium chloride irrigation 0.9 % 500 mL irrigation  Status:  Discontinued       As needed 09/15/17 1237 09/15/17 1433   09/13/17 1200  nafcillin 2 g in dextrose 5 % 100 mL IVPB  Status:  Discontinued     2 g 200 mL/hr over 30 Minutes Intravenous Every 4 hours 09/13/17 0918 09/15/17 2035   09/12/17 1400  ceFAZolin (ANCEF) IVPB 2g/100 mL premix  Status:  Discontinued     2 g 200 mL/hr over 30 Minutes Intravenous Every 8 hours 09/12/17 0848 09/13/17 0918   09/12/17 1000  vancomycin (VANCOCIN) 1,250 mg in sodium chloride 0.9 % 250 mL IVPB  Status:  Discontinued     1,250 mg 166.7 mL/hr over 90  Minutes Intravenous Every 12 hours 09/11/17 1904 09/12/17 0828   09/12/17 0600  ceFEPIme (MAXIPIME) 1 g in dextrose 5 % 50 mL IVPB  Status:  Discontinued     1 g 100 mL/hr over 30 Minutes Intravenous Every 8 hours 09/11/17 1904 09/12/17 0827   09/11/17 1830  vancomycin (VANCOCIN) 2,000 mg in sodium chloride 0.9 % 500 mL IVPB     2,000 mg 250 mL/hr over 120 Minutes Intravenous  Once 09/11/17 1758 09/12/17 0026   09/11/17 1815  ceFEPIme (MAXIPIME) 2 g in dextrose 5 % 50 mL IVPB     2 g 100 mL/hr over 30 Minutes Intravenous  Once 09/11/17 1809 09/11/17 2208   09/11/17 1800  piperacillin-tazobactam (ZOSYN) IVPB 3.375 g  Status:  Discontinued     3.375 g 12.5 mL/hr over 240 Minutes Intravenous Every 8 hours 09/11/17 1757 09/11/17 1808     Subjective: Seen and examined at bedside and stated she slept better but was tired from walking around the unit. No CP or SOB. No other complaints or concerns at this time.   Objective: Vitals:   10/12/17 1630 10/12/17 1956 10/12/17 2328 10/13/17 0328  BP:  124/76 127/85 117/78 102/63  Pulse: 68 70 68 64  Resp: 16 16 16 16   Temp: 97.9 F (36.6 C) 98.1 F (36.7 C) 98 F (36.7 C) 98.6 F (37 C)  TempSrc: Oral Oral Oral Oral  SpO2: 97% 98% 100% 96%  Weight:      Height:        Intake/Output Summary (Last 24 hours) at 10/13/2017 0750 Last data filed at 10/13/2017 0100 Gross per 24 hour  Intake 1400 ml  Output -  Net 1400 ml   Filed Weights   09/13/17 0730 09/14/17 0831 09/21/17 0400  Weight: 117.4 kg (258 lb 13.1 oz) 115.8 kg (255 lb 4.7 oz) 111.1 kg (244 lb 14.9 oz)   Examination: Physical Exam:  Constitutional: WN/WD obese Caucasian female in NAD appears calm laying in bed  Eyes: Lids and conjunctivae normal. Sclerae anicteric  ENMT: External Ears and Nose appear normal. Grossly normal hearing. MMM Neck: Supple; No JVD Respiratory: CTAB; No appreciable wheezing/rales/rhonchi.  Cardiovascular: RRR. No appreciable m/r/g. No extremity  edema Abdomen: Soft, NT, ND. Bowel sounds present GU: Deferred Musculoskeletal: No contractures; No cyanosis Skin: Warm and dry. No rashes or lesions on a limited skin eval; Right PICC in place Neurologic: CN 2-12 grossly intact. No appreciable focal deficits  Psychiatric: Normal mood and affect. Intact judgement and insight  Data Reviewed: I have personally reviewed following labs and imaging studies  CBC: Recent Labs  Lab 10/08/17 0610 10/12/17 0830  WBC 10.2 7.8  NEUTROABS 5.5 4.3  HGB 11.7* 11.4*  HCT 35.9* 35.5*  MCV 92.8 93.7  PLT 467* 161*   Basic Metabolic Panel: Recent Labs  Lab 10/08/17 0610 10/12/17 0830  NA 133* 135  K 3.7 3.7  CL 96* 98*  CO2 28 28  GLUCOSE 152* 157*  BUN 8 7  CREATININE 0.71 0.65  CALCIUM 9.1 8.9  MG 1.8 1.7  PHOS  --  4.4   GFR: Estimated Creatinine Clearance: 108.6 mL/min (by C-G formula based on SCr of 0.65 mg/dL). Liver Function Tests: Recent Labs  Lab 10/08/17 0610 10/12/17 0830  AST 39 38  ALT 20 20  ALKPHOS 164* 155*  BILITOT 1.0 0.8  PROT 8.1 8.1  ALBUMIN 2.7* 2.8*   No results for input(s): LIPASE, AMYLASE in the last 168 hours. No results for input(s): AMMONIA in the last 168 hours. Coagulation Profile: No results for input(s): INR, PROTIME in the last 168 hours. Cardiac Enzymes: No results for input(s): CKTOTAL, CKMB, CKMBINDEX, TROPONINI in the last 168 hours. BNP (last 3 results) No results for input(s): PROBNP in the last 8760 hours. HbA1C: No results for input(s): HGBA1C in the last 72 hours. CBG: No results for input(s): GLUCAP in the last 168 hours. Lipid Profile: No results for input(s): CHOL, HDL, LDLCALC, TRIG, CHOLHDL, LDLDIRECT in the last 72 hours. Thyroid Function Tests: No results for input(s): TSH, T4TOTAL, FREET4, T3FREE, THYROIDAB in the last 72 hours. Anemia Panel: No results for input(s): VITAMINB12, FOLATE, FERRITIN, TIBC, IRON, RETICCTPCT in the last 72 hours. Sepsis Labs: No results  for input(s): PROCALCITON, LATICACIDVEN in the last 168 hours.  No results found for this or any previous visit (from the past 240 hour(s)).   Radiology Studies: No results found.  Scheduled Meds: . Chlorhexidine Gluconate Cloth  6 each Topical Q2200  . DULoxetine  60 mg Oral Daily  . heparin injection (subcutaneous)  5,000 Units Subcutaneous Q8H  . oxyCODONE  10 mg Oral Q12H  . rifampin  300 mg  Oral Q12H  . senna-docusate  1 tablet Oral BID  . sodium chloride flush  10-40 mL Intracatheter Q12H   Continuous Infusions: .  ceFAZolin (ANCEF) IV 2 g (10/13/17 0654)    LOS: 66 days   Kerney Elbe, DO [facility] Pager [phone]  If 7PM-7AM, please contact night-coverage [email or web address] Password [email or web address] 10/13/2017, 7:50 AM

## 2017-10-14 LAB — COMPREHENSIVE METABOLIC PANEL
ALT: 19 U/L (ref 14–54)
ANION GAP: 7 (ref 5–15)
AST: 33 U/L (ref 15–41)
Albumin: 3 g/dL — ABNORMAL LOW (ref 3.5–5.0)
Alkaline Phosphatase: 152 U/L — ABNORMAL HIGH (ref 38–126)
BILIRUBIN TOTAL: 0.8 mg/dL (ref 0.3–1.2)
BUN: 8 mg/dL (ref 6–20)
CO2: 30 mmol/L (ref 22–32)
Calcium: 9.1 mg/dL (ref 8.9–10.3)
Chloride: 100 mmol/L — ABNORMAL LOW (ref 101–111)
Creatinine, Ser: 0.58 mg/dL (ref 0.44–1.00)
GFR calc non Af Amer: >60 mL/min (ref >60)
Glucose, Bld: 83 mg/dL (ref 65–99)
POTASSIUM: 4.1 mmol/L (ref 3.5–5.1)
Sodium: 137 mmol/L (ref 135–145)
TOTAL PROTEIN: 8.7 g/dL — AB (ref 6.5–8.1)

## 2017-10-14 LAB — CBC WITH DIFFERENTIAL/PLATELET
Basophils Absolute: 0.1 10*3/uL (ref 0.0–0.1)
Basophils Relative: 1 %
Eosinophils Absolute: 0.8 10*3/uL — ABNORMAL HIGH (ref 0.0–0.7)
Eosinophils Relative: 9 %
HEMATOCRIT: 36.3 % (ref 36.0–46.0)
Hemoglobin: 12 g/dL (ref 12.0–15.0)
LYMPHS ABS: 3.1 10*3/uL (ref 0.7–4.0)
LYMPHS PCT: 38 %
MCH: 30.6 pg (ref 26.0–34.0)
MCHC: 33.1 g/dL (ref 30.0–36.0)
MCV: 92.6 fL (ref 78.0–100.0)
MONO ABS: 0.8 10*3/uL (ref 0.1–1.0)
MONOS PCT: 10 %
NEUTROS ABS: 3.4 10*3/uL (ref 1.7–7.7)
Neutrophils Relative %: 42 %
Platelets: 459 10*3/uL — ABNORMAL HIGH (ref 150–400)
RBC: 3.92 MIL/uL (ref 3.87–5.11)
RDW: 14.6 % (ref 11.5–15.5)
WBC: 8.1 10*3/uL (ref 4.0–10.5)

## 2017-10-14 LAB — PHOSPHORUS: PHOSPHORUS: 5.4 mg/dL — AB (ref 2.5–4.6)

## 2017-10-14 LAB — MRSA PCR SCREENING: MRSA BY PCR: NEGATIVE

## 2017-10-14 LAB — MAGNESIUM: Magnesium: 2 mg/dL (ref 1.7–2.4)

## 2017-10-14 NOTE — Progress Notes (Signed)
PROGRESS NOTE    [name]  SHF:[id number] DOB: January 23, 1966 DOA: 09/11/2017 PCP: Marliss Coots, NP  Brief Narrative:  51 year old female with history of PTSD, depression, intravenous drug use, chronic back pain status post previous back surgery, ADD, bilateral ovarian cysts, PID, sciatica presented on 09/11/2017 with severe back and neck pain. MRI of the T and L-spine revealed ventral epidural abscess. She was found to have MSSA bacteremia. She was put on IV antibiotics. ID and neurosurgery were consulted. Patient was transferred to [facility]. She developed urinary retention on 09/15/2017 for which she required emergent C3-C7 laminectomy for decompression of spinal cord and debridement of epidural abscess. She had TEE on 09/20/2017 which was negative for vegetations. ID recommends at least 6 weeks of IV antibiotics and last negative Blood Cx were 09/19/17.  Assessment & Plan:   Principal Problem:   Severe sepsis ([place]) Active Problems:   Hypokalemia   Lymphadenopathy   Back pain   Hyponatremia   Right ovarian cyst   Hypotension   MSSA bacteremia   Abscess in epidural space of thoracic spine   Mediastinal mass   Obesity, Class III, BMI 40-49.9 (morbid obesity) (HCC)   Leucocytosis   Neck pain   Abscess in epidural space of cervical spine  Severe Sepsis in the setting of MSSA Bacteremia and Abscesses in the Cervical and Thoracic Spine  -Sepsis physiology currently resolved -WBC was 7.8 today' -CRP improved from 2.6 -> 1.2 -Repeat CBC intermittently  -Patient is Hemodynamically stable   MSSA Bacteremia -TEE negative for endocarditis on 09/20/17, blood cultures negative from 11/25 -ID following, recommended to Cefazolin and Rifampin, 6 weeks of antibiotics per last note from Dr. Johnnye Sima on 12/3.   -Negative Blood Cx from 09/19/17 -Patient will remain inpatient while on IV antibiotics. -LFTs normal today (on rifampin); Check intermittently   Complex  spinal epidural abscess extending from C2-T8 -No neurological deficits currently -Status post emergent C3-C7 laminectomy for decompression of spinal cord, debridement of epidural abscess on 11/21 -Continue IV Cefazolin 2 grams q8h and Rifampin 300 mg po q12h -Neurosurgery was following.   Polysubstance Abuse/IV drug abuse -Patient to stay inpatient while on IV antibiotics and cannot be discharged with IV access/PICC line  Chronic Pain Syndrome -Currently Stable and pain controlled -Continue Duloxetine 60 mg po Daily, Methocarbamol 750 mg po q8hprn, Oxycodone 10 mg q6hprn Moderate Pain, and OxyContin 10 mg po q12h -Bowel Regimen with Senna-Docusate 1 tab po BID and Miralax 17 grams po Dailyprn  -Avoid IV Narcotics   Obesity, Class III, BMI 40-49.9 (Morbid obesity) ([place]) -Patient counseled on diet and weight control  -Nutrition consult  Reactive Thrombocytosis -Last seen by Dr. Jana Hakim on 11/18 to r/o Lymphoma  -Platelet Count improved went from 972 -> 428 -> 459 -Repeat Blood work Intermittently   Hepatitis C -AST (33) and ALT (19) wnL -Hep C Genotype was 1a and HCV Quantitative was 2,240,000 and HCV Quantitave Log was 6.350 -Outpatient follow-up with ID once IV Abx Infection is complete   Normocytic Iron Deficiency Anemia -Iron Level was 24, UIBC 208, TIBC 232, Saturation Ratios 10, Ferritin 193, and Folate 12.5; Vitamin B12 was 679 -Stable. Hb/Hct now 12.0/36.3 -Continue to Monitor for S/Sx of Bleeding -Repeat CBC intermittently  Hyponatremia -Improved. Na+ now 137 -Continue to Monitor intermittently   DVT prophylaxis: Heparin 5,000 sq q8h Code Status: FULL CODE Family Communication: No family present at bedside Disposition Plan: Home when IV Abx Treatment is completed as PT recommended no follow up  Consultants:   Hematology/Oncology  Infectious Diseases  Neurosurgery  Cardiology   Procedures:  Emergent C3-C7 laminectomy for decompression of spinal  cord and debridement of epidural abscess on 09/15/2017  TEE on 09/20/2017: No evidence of vegetations  Transthoracic ECHO on 09/12/2017 Study Conclusions  - Left ventricle: The cavity size was normal. Wall thickness was normal. Systolic function was normal. The estimated ejection fraction was in the range of 55% to 60%. Wall motion was normal; there were no regional wall motion abnormalities. Left ventricular diastolic function parameters were normal. - Mitral valve: There was mild regurgitation. - Pulmonary arteries: Systolic pressure was mildly increased. PA peak pressure: 39 mm Hg (S).   Antimicrobials:  Anti-infectives (From admission, onward)   Start     Dose/Rate Route Frequency Ordered Stop   09/27/17 1800  ceFAZolin (ANCEF) IVPB 2g/100 mL premix     2 g 200 mL/hr over 30 Minutes Intravenous Every 8 hours 09/27/17 1622     09/16/17 2300  nafcillin 2 g in dextrose 5 % 100 mL IVPB  Status:  Discontinued     2 g 200 mL/hr over 30 Minutes Intravenous Every 4 hours 09/15/17 2035 09/15/17 2303   09/16/17 1400  rifampin (RIFADIN) capsule 300 mg     300 mg Oral Every 12 hours 09/16/17 1344     09/15/17 2315  nafcillin 2 g in dextrose 5 % 100 mL IVPB  Status:  Discontinued     2 g 200 mL/hr over 30 Minutes Intravenous Every 4 hours 09/15/17 2303 09/27/17 1622   09/15/17 1237  bacitracin 50,000 Units in sodium chloride irrigation 0.9 % 500 mL irrigation  Status:  Discontinued       As needed 09/15/17 1237 09/15/17 1433   09/13/17 1200  nafcillin 2 g in dextrose 5 % 100 mL IVPB  Status:  Discontinued     2 g 200 mL/hr over 30 Minutes Intravenous Every 4 hours 09/13/17 0918 09/15/17 2035   09/12/17 1400  ceFAZolin (ANCEF) IVPB 2g/100 mL premix  Status:  Discontinued     2 g 200 mL/hr over 30 Minutes Intravenous Every 8 hours 09/12/17 0848 09/13/17 0918   09/12/17 1000  vancomycin (VANCOCIN) 1,250 mg in sodium chloride 0.9 % 250 mL IVPB  Status:  Discontinued     1,250  mg 166.7 mL/hr over 90 Minutes Intravenous Every 12 hours 09/11/17 1904 09/12/17 0828   09/12/17 0600  ceFEPIme (MAXIPIME) 1 g in dextrose 5 % 50 mL IVPB  Status:  Discontinued     1 g 100 mL/hr over 30 Minutes Intravenous Every 8 hours 09/11/17 1904 09/12/17 0827   09/11/17 1830  vancomycin (VANCOCIN) 2,000 mg in sodium chloride 0.9 % 500 mL IVPB     2,000 mg 250 mL/hr over 120 Minutes Intravenous  Once 09/11/17 1758 09/12/17 0026   09/11/17 1815  ceFEPIme (MAXIPIME) 2 g in dextrose 5 % 50 mL IVPB     2 g 100 mL/hr over 30 Minutes Intravenous  Once 09/11/17 1809 09/11/17 2208   09/11/17 1800  piperacillin-tazobactam (ZOSYN) IVPB 3.375 g  Status:  Discontinued     3.375 g 12.5 mL/hr over 240 Minutes Intravenous Every 8 hours 09/11/17 1757 09/11/17 1808     Subjective: Seen and examined at bedside and had no complaints. States she was walking around the unit and pushing herself to do more laps. No other complaints or concerns.   Objective: Vitals:   10/14/17 0340 10/14/17 0747 10/14/17 1150 10/14/17 1600  BP: 113/71 113/74 108/66 123/85  Pulse: 68 69 69 61  Resp: 16   18  Temp: 98.3 F (36.8 C) 98.1 F (36.7 C) 98.1 F (36.7 C) 97.8 F (36.6 C)  TempSrc: Oral Oral Oral Oral  SpO2: 95% 97% 95% 99%  Weight:      Height:        Intake/Output Summary (Last 24 hours) at 10/14/2017 1621 Last data filed at 10/14/2017 1600 Gross per 24 hour  Intake 1700 ml  Output -  Net 1700 ml   Filed Weights   09/13/17 0730 09/14/17 0831 09/21/17 0400  Weight: 117.4 kg (258 lb 13.1 oz) 115.8 kg (255 lb 4.7 oz) 111.1 kg (244 lb 14.9 oz)   Examination: Physical Exam:  Constitutional: WN/WD obese Caucasian female in NAD appears calm Eyes: Sclerae anicteric. Lids normal ENMT: External Ears and nose appear normal. Grossly normal hearing MMM Neck: Supple with no JVD Respiratory: CTAB; No appreciable wheezing/rales/rhonchi Cardiovascular: RRR; No appreciable m/r/g. No LE Edema Abdomen:  Soft, NT, ND. Bowel Sounds Present. GU: Deferred Musculoskeletal: No contractures; No cyanosis  Skin: Warm and dry. No rashes or lesions on a limited skin eval. Right PICC Neurologic: CN 2-12 grossly intact. No appreciable focal deficits Psychiatric: Normal mood and affect. Intact judgement and insight  Data Reviewed: I have personally reviewed following labs and imaging studies  CBC: Recent Labs  Lab 10/08/17 0610 10/12/17 0830 10/14/17 0500  WBC 10.2 7.8 8.1  NEUTROABS 5.5 4.3 3.4  HGB 11.7* 11.4* 12.0  HCT 35.9* 35.5* 36.3  MCV 92.8 93.7 92.6  PLT 467* 428* 979*   Basic Metabolic Panel: Recent Labs  Lab 10/08/17 0610 10/12/17 0830 10/14/17 0500  NA 133* 135 137  K 3.7 3.7 4.1  CL 96* 98* 100*  CO2 28 28 30   GLUCOSE 152* 157* 83  BUN 8 7 8   CREATININE 0.71 0.65 0.58  CALCIUM 9.1 8.9 9.1  MG 1.8 1.7 2.0  PHOS  --  4.4 5.4*   GFR: Estimated Creatinine Clearance: 108.6 mL/min (by C-G formula based on SCr of 0.58 mg/dL). Liver Function Tests: Recent Labs  Lab 10/08/17 0610 10/12/17 0830 10/14/17 0500  AST 39 38 33  ALT 20 20 19   ALKPHOS 164* 155* 152*  BILITOT 1.0 0.8 0.8  PROT 8.1 8.1 8.7*  ALBUMIN 2.7* 2.8* 3.0*   No results for input(s): LIPASE, AMYLASE in the last 168 hours. No results for input(s): AMMONIA in the last 168 hours. Coagulation Profile: No results for input(s): INR, PROTIME in the last 168 hours. Cardiac Enzymes: No results for input(s): CKTOTAL, CKMB, CKMBINDEX, TROPONINI in the last 168 hours. BNP (last 3 results) No results for input(s): PROBNP in the last 8760 hours. HbA1C: No results for input(s): HGBA1C in the last 72 hours. CBG: No results for input(s): GLUCAP in the last 168 hours. Lipid Profile: No results for input(s): CHOL, HDL, LDLCALC, TRIG, CHOLHDL, LDLDIRECT in the last 72 hours. Thyroid Function Tests: No results for input(s): TSH, T4TOTAL, FREET4, T3FREE, THYROIDAB in the last 72 hours. Anemia Panel: No results  for input(s): VITAMINB12, FOLATE, FERRITIN, TIBC, IRON, RETICCTPCT in the last 72 hours. Sepsis Labs: No results for input(s): PROCALCITON, LATICACIDVEN in the last 168 hours.  No results found for this or any previous visit (from the past 240 hour(s)).   Radiology Studies: No results found.  Scheduled Meds: . Chlorhexidine Gluconate Cloth  6 each Topical Q2200  . DULoxetine  60 mg Oral Daily  . heparin injection (  subcutaneous)  5,000 Units Subcutaneous Q8H  . oxyCODONE  10 mg Oral Q12H  . rifampin  300 mg Oral Q12H  . senna-docusate  1 tablet Oral BID  . sodium chloride flush  10-40 mL Intracatheter Q12H   Continuous Infusions: .  ceFAZolin (ANCEF) IV Stopped (10/14/17 1459)    LOS: 33 days   Kerney Elbe, DO [facility] Pager [phone]  If 7PM-7AM, please contact night-coverage [email or web address] Password [email or web address] 10/14/2017, 4:21 PM

## 2017-10-15 NOTE — Progress Notes (Signed)
[facility]                                                                              Patient Demographics  [name], is a 51 y.o. female, DOB - 05-Aug-1966, RJJ:[id number]  Admit date - 09/11/2017   Admitting Physician Bonnielee Haff, MD  Outpatient Primary MD for the patient is [name], [name], NP  Outpatient specialists:   LOS - 34  days   Medical records reviewed and are as summarized below:    Chief Complaint  Patient presents with  . Neck Pain  . Back Pain       Brief summary   51 year old female with history of PTSD, depression, intravenous drug use, chronic back pain status post previous back surgery, ADD, bilateral ovarian cysts, PID, sciatica presented on 09/11/2017 with severe back and neck pain. MRI of the T and L-spine revealed ventral epidural abscess. She was found to have MSSA bacteremia. She was put on IV antibiotics. ID and neurosurgery were consulted. Patient was transferred to [facility]. She developed urinary retention on 09/15/2017 for which she required emergent C3-C7 laminectomy for decompression of spinal cord and debridement of epidural abscess. She had TEE on 09/20/2017 which was negative for vegetations. ID recommends at least 6 weeks of IV antibiotics and last negative Blood Cx were 09/19/17.   Assessment & Plan    Principal Problem:   Severe sepsis ([place]) in the setting of MSSA bacteremia, cervical and thoracic spine abscess -Sepsis physiology resolved, hemodynamically stable.  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 -No neurological deficits, status post emergent C3-C7 laminectomy for decompression of the spinal cord,  debridement of epidural abscess on 11/21 -Continue IV cefazolin, rifampin per ID recommendations    Hypokalemia -Resolved  History of polysubstance abuse, IV drug use -Currently stable, cannot be discharged with IV access or PICC line, hence will remain inpatient while on IV antibiotics  Chronic pain syndrome -Currently stable, controlled, continue duloxetine, Robaxin, oxycodone, OxyContin with bowel regimen   Obesity, Class III, BMI 40-49.9 (morbid obesity) ([place]) -Counseled on diet and weight control.,  Nutrition consult  Reactive thrombocytosis -Last seen by Dr. Hazle Nordmann not on 11/18 to rule out lymphoma.  Platelet count improving  Hepatitis C -Outpatient follow-up with ID/GI once IV antibiotics is complete  Normocytic iron deficiency anemia -Follow CBC closely, outpatient follow-up with Dr. Jana Hakim  Hyponatremia -Resolved   Code Status: Full CODE STATUS DVT Prophylaxis: Heparin subcu Family Communication: Discussed in detail with the patient, all imaging results, lab results explained to the patient  Disposition Plan: Home once IV antibiotic treatment is open  Time Spent in minutes 25 minutes  Procedures:  Emergent C3-C7 laminectomy for decompression of spinal cord and debridement of epidural abscesson 09/15/2017  TEE on 09/20/2017: No evidence of vegetations  Transthoracic ECHO on 09/12/2017 Study Conclusions  - Left ventricle: The cavity size was normal. Wall thickness  was normal. Systolic function was normal. The estimated ejection fraction was in the range of 55% to 60%. Wall motion was normal; there were no regional wall motion abnormalities. Left ventricular diastolic function parameters were normal. - Mitral valve: There was mild regurgitation. - Pulmonary arteries: Systolic pressure was mildly increased. PA peak pressure: 39 mm Hg (S).    Consultants:    Hematology/Oncology  Infectious  Diseases  Neurosurgery  Cardiology  Antimicrobials:      Medications  Scheduled Meds: . Chlorhexidine Gluconate Cloth  6 each Topical Q2200  . DULoxetine  60 mg Oral Daily  . heparin injection (subcutaneous)  5,000 Units Subcutaneous Q8H  . oxyCODONE  10 mg Oral Q12H  . rifampin  300 mg Oral Q12H  . senna-docusate  1 tablet Oral BID  . sodium chloride flush  10-40 mL Intracatheter Q12H   Continuous Infusions: .  ceFAZolin (ANCEF) IV Stopped (10/15/17 9379)   PRN Meds:.acetaminophen **OR** acetaminophen, hydrALAZINE, hydrOXYzine, menthol-cetylpyridinium **OR** phenol, methocarbamol, ondansetron (ZOFRAN) IV **OR** ondansetron **OR** ondansetron, oxyCODONE, polyethylene glycol, sodium chloride flush   Antibiotics   Anti-infectives (From admission, onward)   Start     Dose/Rate Route Frequency Ordered Stop   09/27/17 1800  ceFAZolin (ANCEF) IVPB 2g/100 mL premix     2 g 200 mL/hr over 30 Minutes Intravenous Every 8 hours 09/27/17 1622     09/16/17 2300  nafcillin 2 g in dextrose 5 % 100 mL IVPB  Status:  Discontinued     2 g 200 mL/hr over 30 Minutes Intravenous Every 4 hours 09/15/17 2035 09/15/17 2303   09/16/17 1400  rifampin (RIFADIN) capsule 300 mg     300 mg Oral Every 12 hours 09/16/17 1344     09/15/17 2315  nafcillin 2 g in dextrose 5 % 100 mL IVPB  Status:  Discontinued     2 g 200 mL/hr over 30 Minutes Intravenous Every 4 hours 09/15/17 2303 09/27/17 1622   09/15/17 1237  bacitracin 50,000 Units in sodium chloride irrigation 0.9 % 500 mL irrigation  Status:  Discontinued       As needed 09/15/17 1237 09/15/17 1433   09/13/17 1200  nafcillin 2 g in dextrose 5 % 100 mL IVPB  Status:  Discontinued     2 g 200 mL/hr over 30 Minutes Intravenous Every 4 hours 09/13/17 0918 09/15/17 2035   09/12/17 1400  ceFAZolin (ANCEF) IVPB 2g/100 mL premix  Status:  Discontinued     2 g 200 mL/hr over 30 Minutes Intravenous Every 8 hours 09/12/17 0848 09/13/17 0918   09/12/17  1000  vancomycin (VANCOCIN) 1,250 mg in sodium chloride 0.9 % 250 mL IVPB  Status:  Discontinued     1,250 mg 166.7 mL/hr over 90 Minutes Intravenous Every 12 hours 09/11/17 1904 09/12/17 0828   09/12/17 0600  ceFEPIme (MAXIPIME) 1 g in dextrose 5 % 50 mL IVPB  Status:  Discontinued     1 g 100 mL/hr over 30 Minutes Intravenous Every 8 hours 09/11/17 1904 09/12/17 0827   09/11/17 1830  vancomycin (VANCOCIN) 2,000 mg in sodium chloride 0.9 % 500 mL IVPB     2,000 mg 250 mL/hr over 120 Minutes Intravenous  Once 09/11/17 1758 09/12/17 0026   09/11/17 1815  ceFEPIme (MAXIPIME) 2 g in dextrose 5 % 50 mL IVPB     2 g 100 mL/hr over 30 Minutes Intravenous  Once 09/11/17 1809 09/11/17 2208   09/11/17 1800  piperacillin-tazobactam (ZOSYN) IVPB 3.375 g  Status:  Discontinued     3.375 g 12.5 mL/hr over 240 Minutes Intravenous Every 8 hours 09/11/17 1757 09/11/17 1808        Subjective:   Syncere Kaminski was seen and examined today.  No specific complaints, no fevers.  Patient denies dizziness, chest pain, shortness of breath, abdominal pain, N/V/D/C, new weakness, numbess, tingling. No acute events overnight.    Objective:   Vitals:   10/14/17 2145 10/14/17 2342 10/15/17 0300 10/15/17 0752  BP: 114/80 (!) 130/91 130/83 120/82  Pulse: 65 71 63 60  Resp: 18 18 17    Temp: 98.1 F (36.7 C) 97.9 F (36.6 C) 98.3 F (36.8 C) 97.9 F (36.6 C)  TempSrc: Oral Oral Oral Oral  SpO2: 99% 99% 96% 97%  Weight:      Height:        Intake/Output Summary (Last 24 hours) at 10/15/2017 1117 Last data filed at 10/15/2017 4098 Gross per 24 hour  Intake 1166 ml  Output -  Net 1166 ml     Wt Readings from Last 3 Encounters:  09/21/17 111.1 kg (244 lb 14.9 oz)  09/11/17 112.5 kg (248 lb)  09/11/17 112.5 kg (248 lb)     Exam  General: Alert and oriented x 3, NAD  Eyes:  HEENT:  Atraumatic, normocephalic  Cardiovascular: S1 S2 auscultated, no rubs, murmurs or gallops. Regular rate and  rhythm.  Respiratory: Clear to auscultation bilaterally, no wheezing, rales or rhonchi  Gastrointestinal: Soft, nontender, nondistended, + bowel sounds  Ext: no pedal edema bilaterally  Neuro: AAOx3, Cr N's II- XII. Strength 5/5 upper and lower extremities bilaterally, speech clear, sensations grossly intact  Musculoskeletal: No digital cyanosis, clubbing  Skin: No rashes  Psych: Normal affect and demeanor, alert and oriented x3    Data Reviewed:  I have personally reviewed following labs and imaging studies  Micro Results Recent Results (from the past 240 hour(s))  MRSA PCR Screening     Status: None   Collection Time: 10/14/17  3:53 PM  Result Value Ref Range Status   MRSA by PCR NEGATIVE NEGATIVE Final    Comment:        The GeneXpert MRSA Assay (FDA approved for NASAL specimens only), is one component of a comprehensive MRSA colonization surveillance program. It is not intended to diagnose MRSA infection nor to guide or monitor treatment for MRSA infections.     Radiology Reports Dg Cervical Spine 2-3 Views  Result Date: 09/15/2017 CLINICAL DATA:  Posterior cervical fusion EXAM: DG C-ARM 61-120 MIN; CERVICAL SPINE - 2-3 VIEW COMPARISON:  None. FLUOROSCOPY TIME:  Fluoroscopy Time:  5 seconds Radiation Exposure Index (if provided by the fluoroscopic device): Available Number of Acquired Spot Images: 2 FINDINGS: Bilateral posterior cervical fusion is noted extending from C3 to what appears to be C6 on the frontal film on the left and C4-C6 on the right. The inferior aspect of the hardware on the lateral projection is poorly visualized. IMPRESSION: Posterior cervical fusion. Electronically Signed   By: Inez Catalina M.D.   On: 09/15/2017 14:07   Ct Angio Chest Pe W Or Wo Contrast  Result Date: 09/30/2017 CLINICAL DATA:  51 y/o F; shortness of breath, PE suspected, high pretest probability. EXAM: CT ANGIOGRAPHY CHEST WITH CONTRAST TECHNIQUE: Multidetector CT imaging of the  chest was performed using the standard protocol during bolus administration of intravenous contrast. Multiplanar CT image reconstructions and MIPs were obtained to evaluate the vascular anatomy. CONTRAST:  53 cc Isovue 370 COMPARISON:  09/11/2017  CT of the chest. 09/01/2017 thoracic spine MRI. FINDINGS: Cardiovascular: Satisfactory opacification of the pulmonary arteries to the segmental level. No evidence of pulmonary embolism. Normal heart size. No pericardial effusion. Mediastinum/Nodes: Prominent mediastinal lymph nodes are likely reactive. Normal thyroid gland. Normal thoracic esophagus. Right central venous catheter tip extends to the lower SVC. Lungs/Pleura: Small left pleural effusion. Upper Abdomen: No acute abnormality. Musculoskeletal: Paravertebral fluid collection predominantly on the right aspect of vertebral bodies and with a smaller component anterior and left lateral to the vertebral bodies extends from the T3-4 level to the T8 level and demonstrates a faint rim of peripheral enhancement (series 5, image 50 and series 8, image 77). There are faint lucent changes within the T7-8 endplates which may represent discitis osteomyelitis. Review of the MIP images confirms the above findings. IMPRESSION: 1. No pulmonary embolus identified. 2. Paravertebral rim enhancing collection extending from T3-4 to the T8 level, greater on the right, compatible with abscess, is decreased in its craniocaudal extent from prior thoracic MRI. 3. Faint lucency within T7-8 anterior endplates may represent developing osteomyelitis. No gross bony destructive changes. 4. Small left pleural effusion. Electronically Signed   By: Kristine Garbe M.D.   On: 09/30/2017 22:15   Ct Abdomen Pelvis W Contrast  Result Date: 09/21/2017 CLINICAL DATA:  Bacteremia. Known thoracic and cervical abscess with cervical decompression 6 days ago. EXAM: CT ABDOMEN AND PELVIS WITH CONTRAST TECHNIQUE: Multidetector CT imaging of the  abdomen and pelvis was performed using the standard protocol following bolus administration of intravenous contrast. CONTRAST:  100 cc Isovue-300 intravenous COMPARISON:  09/11/2017 FINDINGS: Lower chest: Progression of posterior mediastinal disease centered at T7 and T8 with discrete paravertebral abscesses bilaterally and ventrally. The collection on the right (which may communicate to the left) measures up to 21 x 41 mm on axial slices. Small bilateral pleural effusion that are dependent and without visible septation or pleural thickening. Lower lobe atelectasis. Hepatobiliary: No focal liver abnormality.No evidence of biliary obstruction or stone. Pancreas: Unremarkable. Spleen: Unremarkable. Adrenals/Urinary Tract: Negative adrenals. No hydronephrosis or stone. Subcentimeter presumed cyst in the left kidney. Gas in the urinary bladder without bladder wall thickening or inflammation. Stomach/Bowel:  No obstruction. No appendicitis. Vascular/Lymphatic: No acute vascular abnormality. No mass or adenopathy. Reproductive:Hysterectomy. Tubular cystic structure in the right pelvis measuring up to 2 cm in thickness, length measurement limited by redundant shape. This is the appearance of hydrosalpinx. No wall thickening or surrounding fat inflammation. Other: No ascites or pneumoperitoneum. Musculoskeletal: No progressive or visible destructive changes in the thoracic spine. L3-S1 solid fusion. Known epidural abscess in the thoracic spine not well assessed by CT. IMPRESSION: 1. Progressive paravertebral infection at T7 and T8 with collections/abscess measuring up to 4 cm. No osteomyelitis by CT. 2. Small bilateral pleural effusions without visible complexity. Lower lobe atelectasis. 3. No abscess or other acute finding in the abdomen. 4. Right hydrosalpinx. Recommend pelvic ultrasound confirmation after convalescence. 5. Gas in the urinary bladder without wall thickening, please correlate with urinalysis.  Electronically Signed   By: Monte Fantasia M.D.   On: 09/21/2017 21:12   Dg C-arm 1-60 Min  Result Date: 09/15/2017 CLINICAL DATA:  Posterior cervical fusion EXAM: DG C-ARM 61-120 MIN; CERVICAL SPINE - 2-3 VIEW COMPARISON:  None. FLUOROSCOPY TIME:  Fluoroscopy Time:  5 seconds Radiation Exposure Index (if provided by the fluoroscopic device): Available Number of Acquired Spot Images: 2 FINDINGS: Bilateral posterior cervical fusion is noted extending from C3 to what appears to be C6 on  the frontal film on the left and C4-C6 on the right. The inferior aspect of the hardware on the lateral projection is poorly visualized. IMPRESSION: Posterior cervical fusion. Electronically Signed   By: Inez Catalina M.D.   On: 09/15/2017 14:07    Lab Data:  CBC: Recent Labs  Lab 10/12/17 0830 10/14/17 0500  WBC 7.8 8.1  NEUTROABS 4.3 3.4  HGB 11.4* 12.0  HCT 35.5* 36.3  MCV 93.7 92.6  PLT 428* 774*   Basic Metabolic Panel: Recent Labs  Lab 10/12/17 0830 10/14/17 0500  NA 135 137  K 3.7 4.1  CL 98* 100*  CO2 28 30  GLUCOSE 157* 83  BUN 7 8  CREATININE 0.65 0.58  CALCIUM 8.9 9.1  MG 1.7 2.0  PHOS 4.4 5.4*   GFR: Estimated Creatinine Clearance: 108.6 mL/min (by C-G formula based on SCr of 0.58 mg/dL). Liver Function Tests: Recent Labs  Lab 10/12/17 0830 10/14/17 0500  AST 38 33  ALT 20 19  ALKPHOS 155* 152*  BILITOT 0.8 0.8  PROT 8.1 8.7*  ALBUMIN 2.8* 3.0*   No results for input(s): LIPASE, AMYLASE in the last 168 hours. No results for input(s): AMMONIA in the last 168 hours. Coagulation Profile: No results for input(s): INR, PROTIME in the last 168 hours. Cardiac Enzymes: No results for input(s): CKTOTAL, CKMB, CKMBINDEX, TROPONINI in the last 168 hours. BNP (last 3 results) No results for input(s): PROBNP in the last 8760 hours. HbA1C: No results for input(s): HGBA1C in the last 72 hours. CBG: No results for input(s): GLUCAP in the last 168 hours. Lipid Profile: No  results for input(s): CHOL, HDL, LDLCALC, TRIG, CHOLHDL, LDLDIRECT in the last 72 hours. Thyroid Function Tests: No results for input(s): TSH, T4TOTAL, FREET4, T3FREE, THYROIDAB in the last 72 hours. Anemia Panel: No results for input(s): VITAMINB12, FOLATE, FERRITIN, TIBC, IRON, RETICCTPCT in the last 72 hours. Urine analysis:    Component Value Date/Time   COLORURINE YELLOW 09/11/2017 0709   APPEARANCEUR HAZY (A) 09/11/2017 0709   LABSPEC 1.020 09/11/2017 0709   PHURINE 6.0 09/11/2017 0709   GLUCOSEU NEGATIVE 09/11/2017 0709   HGBUR SMALL (A) 09/11/2017 0709   BILIRUBINUR NEGATIVE 09/11/2017 0709   KETONESUR NEGATIVE 09/11/2017 0709   PROTEINUR NEGATIVE 09/11/2017 0709   UROBILINOGEN 0.2 06/27/2015 0225   NITRITE NEGATIVE 09/11/2017 0709   LEUKOCYTESUR LARGE (A) 09/11/2017 0709     Britian Jentz M.D. [facility] 10/15/2017, 11:17 AM  Pager: [phone] Between 7am to 7pm - call Pager - [phone]  After 7pm go to [email or web address] - password [email or web address]  Call night coverage person covering after 7pm

## 2017-10-16 NOTE — Progress Notes (Signed)
[facility]                                                                              Patient Demographics  [name], is a 51 y.o. female, DOB - 12-08-1965, WVP:[id number]  Admit date - 09/11/2017   Admitting Physician Bonnielee Haff, MD  Outpatient Primary MD for the patient is [name], Virginia Muscat, NP  Outpatient specialists:   LOS - 35  days   Medical records reviewed and are as summarized below:    Chief Complaint  Patient presents with  . Neck Pain  . Back Pain       Brief summary   51 year old female with history of PTSD, depression, intravenous drug use, chronic back pain status post previous back surgery, ADD, bilateral ovarian cysts, PID, sciatica presented on 09/11/2017 with severe back and neck pain. MRI of the T and L-spine revealed ventral epidural abscess. She was found to have MSSA bacteremia. She was put on IV antibiotics. ID and neurosurgery were consulted. Patient was transferred to [facility]. She developed urinary retention on 09/15/2017 for which she required emergent C3-C7 laminectomy for decompression of spinal cord and debridement of epidural abscess. She had TEE on 09/20/2017 which was negative for vegetations. ID recommends at least 6 weeks of IV antibiotics and last negative Blood Cx were 09/19/17.   Assessment & Plan    Principal Problem:   Severe sepsis ([place]) in the setting of MSSA bacteremia, cervical and thoracic spine abscess -Sepsis physiology resolved, hemodynamically stable.  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 -No neurological deficits, status post emergent C3-C7 laminectomy for  decompression of the spinal cord, debridement of epidural abscess on 11/21 -Continue IV cefazolin, rifampin per ID recommendations -LFTs normal except mild elevation of alkaline phosphatase which is improving    Hypokalemia -Resolved  History of polysubstance abuse, IV drug use -Currently stable, cannot be discharged with IV access or PICC line, hence will remain inpatient while on IV antibiotics  Chronic pain syndrome -Currently stable, controlled, continue duloxetine, Robaxin, oxycodone, OxyContin with bowel regimen   Obesity, Class III, BMI 40-49.9 (morbid obesity) ([place]) -Counseled on diet and weight control.,  Nutrition consult  Reactive thrombocytosis -Last seen by Dr. Jana Hakim on 11/18 to rule out lymphoma.    Hepatitis C -Outpatient follow-up with ID/GI once IV antibiotics is complete  Normocytic iron deficiency anemia -H&H stable and baseline, outpatient follow-up with Dr. Jana Hakim  Hyponatremia -Resolved   Code Status: Full CODE STATUS DVT Prophylaxis: Heparin subcu Family Communication: Discussed in detail with the patient, all imaging results, lab results explained to the patient  Disposition Plan: Home once IV antibiotic treatment is open  Time Spent in minutes 25 minutes  Procedures:  Emergent C3-C7 laminectomy for decompression of spinal cord and debridement of epidural abscesson 09/15/2017  TEE on 09/20/2017: No evidence of vegetations  Transthoracic ECHO on 09/12/2017  Study Conclusions  - Left ventricle: The cavity size was normal. Wall thickness was normal. Systolic function was normal. The estimated ejection fraction was in the range of 55% to 60%. Wall motion was normal; there were no regional wall motion abnormalities. Left ventricular diastolic function parameters were normal. - Mitral valve: There was mild regurgitation. - Pulmonary arteries: Systolic pressure was mildly increased. PA peak pressure: 39 mm Hg  (S).    Consultants:    Hematology/Oncology  Infectious Diseases  Neurosurgery  Cardiology  Antimicrobials:      Medications  Scheduled Meds: . Chlorhexidine Gluconate Cloth  6 each Topical Q2200  . DULoxetine  60 mg Oral Daily  . heparin injection (subcutaneous)  5,000 Units Subcutaneous Q8H  . oxyCODONE  10 mg Oral Q12H  . rifampin  300 mg Oral Q12H  . senna-docusate  1 tablet Oral BID  . sodium chloride flush  10-40 mL Intracatheter Q12H   Continuous Infusions: .  ceFAZolin (ANCEF) IV Stopped (10/16/17 0719)   PRN Meds:.acetaminophen **OR** acetaminophen, hydrALAZINE, hydrOXYzine, menthol-cetylpyridinium **OR** phenol, methocarbamol, ondansetron (ZOFRAN) IV **OR** ondansetron **OR** ondansetron, oxyCODONE, polyethylene glycol, sodium chloride flush   Antibiotics   Anti-infectives (From admission, onward)   Start     Dose/Rate Route Frequency Ordered Stop   09/27/17 1800  ceFAZolin (ANCEF) IVPB 2g/100 mL premix     2 g 200 mL/hr over 30 Minutes Intravenous Every 8 hours 09/27/17 1622     09/16/17 2300  nafcillin 2 g in dextrose 5 % 100 mL IVPB  Status:  Discontinued     2 g 200 mL/hr over 30 Minutes Intravenous Every 4 hours 09/15/17 2035 09/15/17 2303   09/16/17 1400  rifampin (RIFADIN) capsule 300 mg     300 mg Oral Every 12 hours 09/16/17 1344     09/15/17 2315  nafcillin 2 g in dextrose 5 % 100 mL IVPB  Status:  Discontinued     2 g 200 mL/hr over 30 Minutes Intravenous Every 4 hours 09/15/17 2303 09/27/17 1622   09/15/17 1237  bacitracin 50,000 Units in sodium chloride irrigation 0.9 % 500 mL irrigation  Status:  Discontinued       As needed 09/15/17 1237 09/15/17 1433   09/13/17 1200  nafcillin 2 g in dextrose 5 % 100 mL IVPB  Status:  Discontinued     2 g 200 mL/hr over 30 Minutes Intravenous Every 4 hours 09/13/17 0918 09/15/17 2035   09/12/17 1400  ceFAZolin (ANCEF) IVPB 2g/100 mL premix  Status:  Discontinued     2 g 200 mL/hr over 30 Minutes  Intravenous Every 8 hours 09/12/17 0848 09/13/17 0918   09/12/17 1000  vancomycin (VANCOCIN) 1,250 mg in sodium chloride 0.9 % 250 mL IVPB  Status:  Discontinued     1,250 mg 166.7 mL/hr over 90 Minutes Intravenous Every 12 hours 09/11/17 1904 09/12/17 0828   09/12/17 0600  ceFEPIme (MAXIPIME) 1 g in dextrose 5 % 50 mL IVPB  Status:  Discontinued     1 g 100 mL/hr over 30 Minutes Intravenous Every 8 hours 09/11/17 1904 09/12/17 0827   09/11/17 1830  vancomycin (VANCOCIN) 2,000 mg in sodium chloride 0.9 % 500 mL IVPB     2,000 mg 250 mL/hr over 120 Minutes Intravenous  Once 09/11/17 1758 09/12/17 0026   09/11/17 1815  ceFEPIme (MAXIPIME) 2 g in dextrose 5 % 50 mL IVPB     2 g 100 mL/hr over 30 Minutes Intravenous  Once 09/11/17 1809 09/11/17  2208   09/11/17 1800  piperacillin-tazobactam (ZOSYN) IVPB 3.375 g  Status:  Discontinued     3.375 g 12.5 mL/hr over 240 Minutes Intravenous Every 8 hours 09/11/17 1757 09/11/17 1808        Subjective:   Raymonde Hamblin was seen and examined today.  No complaints, no fevers, wants to take a nap.  Patient denies dizziness, chest pain, shortness of breath, abdominal pain, N/V/D/C, new weakness, numbess, tingling. No acute events overnight.    Objective:   Vitals:   10/16/17 0050 10/16/17 0422 10/16/17 0815 10/16/17 0845  BP: 126/85 (!) 125/91  111/65  Pulse: 67 72  78  Resp: 17 18  18   Temp: 97.8 F (36.6 C) 98.2 F (36.8 C) 98 F (36.7 C)   TempSrc: Oral Oral Oral   SpO2: 98% 100%  95%  Weight:      Height:        Intake/Output Summary (Last 24 hours) at 10/16/2017 1125 Last data filed at 10/15/2017 1841 Gross per 24 hour  Intake 880 ml  Output -  Net 880 ml     Wt Readings from Last 3 Encounters:  09/21/17 111.1 kg (244 lb 14.9 oz)  09/11/17 112.5 kg (248 lb)  09/11/17 112.5 kg (248 lb)     Exam General: Alert and oriented x 3, NAD Eyes:  HEENT:   Cardiovascular: S1-S2 clear, RRR Respiratory: CTA  B Gastrointestinal: Soft, NT, ND, NBS Ext: no pedal edema bilaterally Neuro: not assessed Musculoskeletal: No digital cyanosis, clubbing Skin: No rashes Psych: Normal affect and demeanor, alert and oriented x3      Data Reviewed:  I have personally reviewed following labs and imaging studies  Micro Results Recent Results (from the past 240 hour(s))  MRSA PCR Screening     Status: None   Collection Time: 10/14/17  3:53 PM  Result Value Ref Range Status   MRSA by PCR NEGATIVE NEGATIVE Final    Comment:        The GeneXpert MRSA Assay (FDA approved for NASAL specimens only), is one component of a comprehensive MRSA colonization surveillance program. It is not intended to diagnose MRSA infection nor to guide or monitor treatment for MRSA infections.     Radiology Reports Ct Angio Chest Pe W Or Wo Contrast  Result Date: 09/30/2017 CLINICAL DATA:  51 y/o F; shortness of breath, PE suspected, high pretest probability. EXAM: CT ANGIOGRAPHY CHEST WITH CONTRAST TECHNIQUE: Multidetector CT imaging of the chest was performed using the standard protocol during bolus administration of intravenous contrast. Multiplanar CT image reconstructions and MIPs were obtained to evaluate the vascular anatomy. CONTRAST:  53 cc Isovue 370 COMPARISON:  09/11/2017 CT of the chest. 09/01/2017 thoracic spine MRI. FINDINGS: Cardiovascular: Satisfactory opacification of the pulmonary arteries to the segmental level. No evidence of pulmonary embolism. Normal heart size. No pericardial effusion. Mediastinum/Nodes: Prominent mediastinal lymph nodes are likely reactive. Normal thyroid gland. Normal thoracic esophagus. Right central venous catheter tip extends to the lower SVC. Lungs/Pleura: Small left pleural effusion. Upper Abdomen: No acute abnormality. Musculoskeletal: Paravertebral fluid collection predominantly on the right aspect of vertebral bodies and with a smaller component anterior and left lateral to the  vertebral bodies extends from the T3-4 level to the T8 level and demonstrates a faint rim of peripheral enhancement (series 5, image 50 and series 8, image 77). There are faint lucent changes within the T7-8 endplates which may represent discitis osteomyelitis. Review of the MIP images confirms the above findings. IMPRESSION:  1. No pulmonary embolus identified. 2. Paravertebral rim enhancing collection extending from T3-4 to the T8 level, greater on the right, compatible with abscess, is decreased in its craniocaudal extent from prior thoracic MRI. 3. Faint lucency within T7-8 anterior endplates may represent developing osteomyelitis. No gross bony destructive changes. 4. Small left pleural effusion. Electronically Signed   By: Kristine Garbe M.D.   On: 09/30/2017 22:15   Ct Abdomen Pelvis W Contrast  Result Date: 09/21/2017 CLINICAL DATA:  Bacteremia. Known thoracic and cervical abscess with cervical decompression 6 days ago. EXAM: CT ABDOMEN AND PELVIS WITH CONTRAST TECHNIQUE: Multidetector CT imaging of the abdomen and pelvis was performed using the standard protocol following bolus administration of intravenous contrast. CONTRAST:  100 cc Isovue-300 intravenous COMPARISON:  09/11/2017 FINDINGS: Lower chest: Progression of posterior mediastinal disease centered at T7 and T8 with discrete paravertebral abscesses bilaterally and ventrally. The collection on the right (which may communicate to the left) measures up to 21 x 41 mm on axial slices. Small bilateral pleural effusion that are dependent and without visible septation or pleural thickening. Lower lobe atelectasis. Hepatobiliary: No focal liver abnormality.No evidence of biliary obstruction or stone. Pancreas: Unremarkable. Spleen: Unremarkable. Adrenals/Urinary Tract: Negative adrenals. No hydronephrosis or stone. Subcentimeter presumed cyst in the left kidney. Gas in the urinary bladder without bladder wall thickening or inflammation.  Stomach/Bowel:  No obstruction. No appendicitis. Vascular/Lymphatic: No acute vascular abnormality. No mass or adenopathy. Reproductive:Hysterectomy. Tubular cystic structure in the right pelvis measuring up to 2 cm in thickness, length measurement limited by redundant shape. This is the appearance of hydrosalpinx. No wall thickening or surrounding fat inflammation. Other: No ascites or pneumoperitoneum. Musculoskeletal: No progressive or visible destructive changes in the thoracic spine. L3-S1 solid fusion. Known epidural abscess in the thoracic spine not well assessed by CT. IMPRESSION: 1. Progressive paravertebral infection at T7 and T8 with collections/abscess measuring up to 4 cm. No osteomyelitis by CT. 2. Small bilateral pleural effusions without visible complexity. Lower lobe atelectasis. 3. No abscess or other acute finding in the abdomen. 4. Right hydrosalpinx. Recommend pelvic ultrasound confirmation after convalescence. 5. Gas in the urinary bladder without wall thickening, please correlate with urinalysis. Electronically Signed   By: Monte Fantasia M.D.   On: 09/21/2017 21:12    Lab Data:  CBC: Recent Labs  Lab 10/12/17 0830 10/14/17 0500  WBC 7.8 8.1  NEUTROABS 4.3 3.4  HGB 11.4* 12.0  HCT 35.5* 36.3  MCV 93.7 92.6  PLT 428* 623*   Basic Metabolic Panel: Recent Labs  Lab 10/12/17 0830 10/14/17 0500  NA 135 137  K 3.7 4.1  CL 98* 100*  CO2 28 30  GLUCOSE 157* 83  BUN 7 8  CREATININE 0.65 0.58  CALCIUM 8.9 9.1  MG 1.7 2.0  PHOS 4.4 5.4*   GFR: Estimated Creatinine Clearance: 108.6 mL/min (by C-G formula based on SCr of 0.58 mg/dL). Liver Function Tests: Recent Labs  Lab 10/12/17 0830 10/14/17 0500  AST 38 33  ALT 20 19  ALKPHOS 155* 152*  BILITOT 0.8 0.8  PROT 8.1 8.7*  ALBUMIN 2.8* 3.0*   No results for input(s): LIPASE, AMYLASE in the last 168 hours. No results for input(s): AMMONIA in the last 168 hours. Coagulation Profile: No results for input(s):  INR, PROTIME in the last 168 hours. Cardiac Enzymes: No results for input(s): CKTOTAL, CKMB, CKMBINDEX, TROPONINI in the last 168 hours. BNP (last 3 results) No results for input(s): PROBNP in the last 8760 hours. HbA1C:  No results for input(s): HGBA1C in the last 72 hours. CBG: No results for input(s): GLUCAP in the last 168 hours. Lipid Profile: No results for input(s): CHOL, HDL, LDLCALC, TRIG, CHOLHDL, LDLDIRECT in the last 72 hours. Thyroid Function Tests: No results for input(s): TSH, T4TOTAL, FREET4, T3FREE, THYROIDAB in the last 72 hours. Anemia Panel: No results for input(s): VITAMINB12, FOLATE, FERRITIN, TIBC, IRON, RETICCTPCT in the last 72 hours. Urine analysis:    Component Value Date/Time   COLORURINE YELLOW 09/11/2017 0709   APPEARANCEUR HAZY (A) 09/11/2017 0709   LABSPEC 1.020 09/11/2017 0709   PHURINE 6.0 09/11/2017 0709   GLUCOSEU NEGATIVE 09/11/2017 0709   HGBUR SMALL (A) 09/11/2017 0709   BILIRUBINUR NEGATIVE 09/11/2017 0709   KETONESUR NEGATIVE 09/11/2017 0709   PROTEINUR NEGATIVE 09/11/2017 0709   UROBILINOGEN 0.2 06/27/2015 0225   NITRITE NEGATIVE 09/11/2017 0709   LEUKOCYTESUR LARGE (A) 09/11/2017 0709     Carleena Mires M.D. [facility] 10/16/2017, 11:25 AM  Pager: [phone] Between 7am to 7pm - call Pager - [phone]  After 7pm go to [email or web address] - password [email or web address]  Call night coverage person covering after 7pm

## 2017-10-17 NOTE — Progress Notes (Signed)
[facility]                                                                              Patient Demographics  [name], is a 51 y.o. female, DOB - 03-Sep-1966, EXB:[id number]  Admit date - 09/11/2017   Admitting Physician Bonnielee Haff, MD  Outpatient Primary MD for the patient is [name], [name], NP  Outpatient specialists:   LOS - 36  days   Medical records reviewed and are as summarized below:    Chief Complaint  Patient presents with  . Neck Pain  . Back Pain       Brief summary   51 year old female with history of PTSD, depression, intravenous drug use, chronic back pain status post previous back surgery, ADD, bilateral ovarian cysts, PID, sciatica presented on 09/11/2017 with severe back and neck pain. MRI of the T and L-spine revealed ventral epidural abscess. She was found to have MSSA bacteremia. She was put on IV antibiotics. ID and neurosurgery were consulted. Patient was transferred to [facility]. She developed urinary retention on 09/15/2017 for which she required emergent C3-C7 laminectomy for decompression of spinal cord and debridement of epidural abscess. She had TEE on 09/20/2017 which was negative for vegetations. ID recommends at least 6 weeks of IV antibiotics and last negative Blood Cx were 09/19/17.   Assessment & Plan    Principal Problem:   Severe sepsis ([place]) in the setting of MSSA bacteremia, cervical and thoracic spine abscess -Sepsis physiology resolved, hemodynamically stable.  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  History of polysubstance abuse, IV drug use -Currently stable, cannot be discharged with IV access or PICC line, hence will remain inpatient while on IV antibiotics  Chronic pain syndrome -Currently stable, controlled, continue duloxetine, Robaxin, oxycodone, OxyContin with bowel regimen   Obesity, Class III, BMI 40-49.9 (morbid obesity) ([place]) -Counseled on diet and weight control.,  Nutrition consult  Reactive thrombocytosis -Last seen by Dr. Jana Hakim on 11/18 to rule out lymphoma.    Hepatitis C -Outpatient follow-up with ID/GI once IV antibiotics is complete  Normocytic iron deficiency anemia -H&H stable and baseline, outpatient follow-up with Dr. Jana Hakim -Recheck CBC in a.m.  Hyponatremia -Resolved, recheck BMET in a.m.   Code Status: Full CODE STATUS DVT Prophylaxis: Heparin subcu Family Communication: Discussed in detail with the patient, all imaging results, lab results explained to the patient  Disposition Plan:   Time Spent in minutes 25 minutes  Procedures:  Emergent C3-C7 laminectomy for decompression of spinal cord and debridement of epidural abscesson 09/15/2017  TEE on 09/20/2017: No  evidence of vegetations  Transthoracic ECHO on 09/12/2017 Study Conclusions  - Left ventricle: The cavity size was normal. Wall thickness was normal. Systolic function was normal. The estimated ejection fraction was in the range of 55% to 60%. Wall motion was normal; there were no regional wall motion abnormalities. Left ventricular diastolic function parameters were normal. - Mitral valve: There was mild regurgitation. - Pulmonary arteries: Systolic pressure was mildly increased. PA peak pressure:  39 mm Hg (S).    Consultants:    Hematology/Oncology  Infectious Diseases  Neurosurgery  Cardiology  Antimicrobials:      Medications  Scheduled Meds: . Chlorhexidine Gluconate Cloth  6 each Topical Q2200  . DULoxetine  60 mg Oral Daily  . heparin injection (subcutaneous)  5,000 Units Subcutaneous Q8H  . oxyCODONE  10 mg Oral Q12H  . rifampin  300 mg Oral Q12H  . senna-docusate  1 tablet Oral BID  . sodium chloride flush  10-40 mL Intracatheter Q12H   Continuous Infusions: .  ceFAZolin (ANCEF) IV Stopped (10/17/17 0748)   PRN Meds:.acetaminophen **OR** acetaminophen, hydrALAZINE, hydrOXYzine, menthol-cetylpyridinium **OR** phenol, methocarbamol, ondansetron (ZOFRAN) IV **OR** ondansetron **OR** ondansetron, oxyCODONE, polyethylene glycol, sodium chloride flush   Antibiotics   Anti-infectives (From admission, onward)   Start     Dose/Rate Route Frequency Ordered Stop   09/27/17 1800  ceFAZolin (ANCEF) IVPB 2g/100 mL premix     2 g 200 mL/hr over 30 Minutes Intravenous Every 8 hours 09/27/17 1622     09/16/17 2300  nafcillin 2 g in dextrose 5 % 100 mL IVPB  Status:  Discontinued     2 g 200 mL/hr over 30 Minutes Intravenous Every 4 hours 09/15/17 2035 09/15/17 2303   09/16/17 1400  rifampin (RIFADIN) capsule 300 mg     300 mg Oral Every 12 hours 09/16/17 1344     09/15/17 2315  nafcillin 2 g in dextrose 5 % 100 mL IVPB  Status:  Discontinued     2 g 200 mL/hr over 30 Minutes Intravenous Every 4 hours 09/15/17 2303 09/27/17 1622   09/15/17 1237  bacitracin 50,000 Units in sodium chloride irrigation 0.9 % 500 mL irrigation  Status:  Discontinued       As needed 09/15/17 1237 09/15/17 1433   09/13/17 1200  nafcillin 2 g in dextrose 5 % 100 mL IVPB  Status:  Discontinued     2 g 200 mL/hr over 30 Minutes Intravenous Every 4 hours 09/13/17 0918 09/15/17 2035   09/12/17 1400  ceFAZolin (ANCEF) IVPB 2g/100 mL premix  Status:  Discontinued     2 g 200 mL/hr over 30  Minutes Intravenous Every 8 hours 09/12/17 0848 09/13/17 0918   09/12/17 1000  vancomycin (VANCOCIN) 1,250 mg in sodium chloride 0.9 % 250 mL IVPB  Status:  Discontinued     1,250 mg 166.7 mL/hr over 90 Minutes Intravenous Every 12 hours 09/11/17 1904 09/12/17 0828   09/12/17 0600  ceFEPIme (MAXIPIME) 1 g in dextrose 5 % 50 mL IVPB  Status:  Discontinued     1 g 100 mL/hr over 30 Minutes Intravenous Every 8 hours 09/11/17 1904 09/12/17 0827   09/11/17 1830  vancomycin (VANCOCIN) 2,000 mg in sodium chloride 0.9 % 500 mL IVPB     2,000 mg 250 mL/hr over 120 Minutes Intravenous  Once 09/11/17 1758 09/12/17 0026   09/11/17 1815  ceFEPIme (MAXIPIME) 2 g in dextrose 5 % 50 mL IVPB     2 g 100 mL/hr over  30 Minutes Intravenous  Once 09/11/17 1809 09/11/17 2208   09/11/17 1800  piperacillin-tazobactam (ZOSYN) IVPB 3.375 g  Status:  Discontinued     3.375 g 12.5 mL/hr over 240 Minutes Intravenous Every 8 hours 09/11/17 1757 09/11/17 1808        Subjective:   Sarann Tregre was seen and examined today.  No complaints, watching TV.  No fevers.  Patient denies dizziness, chest pain, shortness of breath, abdominal pain, N/V/D/C, new weakness, numbess, tingling. No acute events overnight.    Objective:   Vitals:   10/16/17 1644 10/16/17 1950 10/16/17 2311 10/17/17 0346  BP: 120/74 121/79 118/78 119/80  Pulse: 70 71 66 68  Resp: 16 16 16 16   Temp: 97.9 F (36.6 C) 98.2 F (36.8 C) 97.8 F (36.6 C) 98.3 F (36.8 C)  TempSrc: Oral Oral Oral Oral  SpO2: 100% 99% 96% 100%  Weight:      Height:        Intake/Output Summary (Last 24 hours) at 10/17/2017 1119 Last data filed at 10/16/2017 1457 Gross per 24 hour  Intake 780 ml  Output -  Net 780 ml     Wt Readings from Last 3 Encounters:  09/21/17 111.1 kg (244 lb 14.9 oz)  09/11/17 112.5 kg (248 lb)  09/11/17 112.5 kg (248 lb)     Exam   General: Alert and oriented x 3, NAD  Eyes:   HEENT:   Cardiovascular: S1 S2  auscultated, no rubs, murmurs or gallops. Regular rate and rhythm. No pedal edema b/l  Respiratory: Clear to auscultation bilaterally, no wheezing, rales or rhonchi  Gastrointestinal: Soft, nontender, nondistended, + bowel sounds  Ext: no pedal edema bilaterally  Neuro: no new deficits  Musculoskeletal: No digital cyanosis, clubbing  Skin: No rashes  Psych: Normal affect and demeanor, alert and oriented x3    Data Reviewed:  I have personally reviewed following labs and imaging studies  Micro Results Recent Results (from the past 240 hour(s))  MRSA PCR Screening     Status: None   Collection Time: 10/14/17  3:53 PM  Result Value Ref Range Status   MRSA by PCR NEGATIVE NEGATIVE Final    Comment:        The GeneXpert MRSA Assay (FDA approved for NASAL specimens only), is one component of a comprehensive MRSA colonization surveillance program. It is not intended to diagnose MRSA infection nor to guide or monitor treatment for MRSA infections.     Radiology Reports Ct Angio Chest Pe W Or Wo Contrast  Result Date: 09/30/2017 CLINICAL DATA:  51 y/o F; shortness of breath, PE suspected, high pretest probability. EXAM: CT ANGIOGRAPHY CHEST WITH CONTRAST TECHNIQUE: Multidetector CT imaging of the chest was performed using the standard protocol during bolus administration of intravenous contrast. Multiplanar CT image reconstructions and MIPs were obtained to evaluate the vascular anatomy. CONTRAST:  53 cc Isovue 370 COMPARISON:  09/11/2017 CT of the chest. 09/01/2017 thoracic spine MRI. FINDINGS: Cardiovascular: Satisfactory opacification of the pulmonary arteries to the segmental level. No evidence of pulmonary embolism. Normal heart size. No pericardial effusion. Mediastinum/Nodes: Prominent mediastinal lymph nodes are likely reactive. Normal thyroid gland. Normal thoracic esophagus. Right central venous catheter tip extends to the lower SVC. Lungs/Pleura: Small left pleural  effusion. Upper Abdomen: No acute abnormality. Musculoskeletal: Paravertebral fluid collection predominantly on the right aspect of vertebral bodies and with a smaller component anterior and left lateral to the vertebral bodies extends from the T3-4 level to the T8 level and  demonstrates a faint rim of peripheral enhancement (series 5, image 50 and series 8, image 77). There are faint lucent changes within the T7-8 endplates which may represent discitis osteomyelitis. Review of the MIP images confirms the above findings. IMPRESSION: 1. No pulmonary embolus identified. 2. Paravertebral rim enhancing collection extending from T3-4 to the T8 level, greater on the right, compatible with abscess, is decreased in its craniocaudal extent from prior thoracic MRI. 3. Faint lucency within T7-8 anterior endplates may represent developing osteomyelitis. No gross bony destructive changes. 4. Small left pleural effusion. Electronically Signed   By: Kristine Garbe M.D.   On: 09/30/2017 22:15   Ct Abdomen Pelvis W Contrast  Result Date: 09/21/2017 CLINICAL DATA:  Bacteremia. Known thoracic and cervical abscess with cervical decompression 6 days ago. EXAM: CT ABDOMEN AND PELVIS WITH CONTRAST TECHNIQUE: Multidetector CT imaging of the abdomen and pelvis was performed using the standard protocol following bolus administration of intravenous contrast. CONTRAST:  100 cc Isovue-300 intravenous COMPARISON:  09/11/2017 FINDINGS: Lower chest: Progression of posterior mediastinal disease centered at T7 and T8 with discrete paravertebral abscesses bilaterally and ventrally. The collection on the right (which may communicate to the left) measures up to 21 x 41 mm on axial slices. Small bilateral pleural effusion that are dependent and without visible septation or pleural thickening. Lower lobe atelectasis. Hepatobiliary: No focal liver abnormality.No evidence of biliary obstruction or stone. Pancreas: Unremarkable. Spleen:  Unremarkable. Adrenals/Urinary Tract: Negative adrenals. No hydronephrosis or stone. Subcentimeter presumed cyst in the left kidney. Gas in the urinary bladder without bladder wall thickening or inflammation. Stomach/Bowel:  No obstruction. No appendicitis. Vascular/Lymphatic: No acute vascular abnormality. No mass or adenopathy. Reproductive:Hysterectomy. Tubular cystic structure in the right pelvis measuring up to 2 cm in thickness, length measurement limited by redundant shape. This is the appearance of hydrosalpinx. No wall thickening or surrounding fat inflammation. Other: No ascites or pneumoperitoneum. Musculoskeletal: No progressive or visible destructive changes in the thoracic spine. L3-S1 solid fusion. Known epidural abscess in the thoracic spine not well assessed by CT. IMPRESSION: 1. Progressive paravertebral infection at T7 and T8 with collections/abscess measuring up to 4 cm. No osteomyelitis by CT. 2. Small bilateral pleural effusions without visible complexity. Lower lobe atelectasis. 3. No abscess or other acute finding in the abdomen. 4. Right hydrosalpinx. Recommend pelvic ultrasound confirmation after convalescence. 5. Gas in the urinary bladder without wall thickening, please correlate with urinalysis. Electronically Signed   By: Monte Fantasia M.D.   On: 09/21/2017 21:12    Lab Data:  CBC: Recent Labs  Lab 10/12/17 0830 10/14/17 0500  WBC 7.8 8.1  NEUTROABS 4.3 3.4  HGB 11.4* 12.0  HCT 35.5* 36.3  MCV 93.7 92.6  PLT 428* 408*   Basic Metabolic Panel: Recent Labs  Lab 10/12/17 0830 10/14/17 0500  NA 135 137  K 3.7 4.1  CL 98* 100*  CO2 28 30  GLUCOSE 157* 83  BUN 7 8  CREATININE 0.65 0.58  CALCIUM 8.9 9.1  MG 1.7 2.0  PHOS 4.4 5.4*   GFR: Estimated Creatinine Clearance: 108.6 mL/min (by C-G formula based on SCr of 0.58 mg/dL). Liver Function Tests: Recent Labs  Lab 10/12/17 0830 10/14/17 0500  AST 38 33  ALT 20 19  ALKPHOS 155* 152*  BILITOT 0.8 0.8    PROT 8.1 8.7*  ALBUMIN 2.8* 3.0*   No results for input(s): LIPASE, AMYLASE in the last 168 hours. No results for input(s): AMMONIA in the last 168 hours. Coagulation Profile:  No results for input(s): INR, PROTIME in the last 168 hours. Cardiac Enzymes: No results for input(s): CKTOTAL, CKMB, CKMBINDEX, TROPONINI in the last 168 hours. BNP (last 3 results) No results for input(s): PROBNP in the last 8760 hours. HbA1C: No results for input(s): HGBA1C in the last 72 hours. CBG: No results for input(s): GLUCAP in the last 168 hours. Lipid Profile: No results for input(s): CHOL, HDL, LDLCALC, TRIG, CHOLHDL, LDLDIRECT in the last 72 hours. Thyroid Function Tests: No results for input(s): TSH, T4TOTAL, FREET4, T3FREE, THYROIDAB in the last 72 hours. Anemia Panel: No results for input(s): VITAMINB12, FOLATE, FERRITIN, TIBC, IRON, RETICCTPCT in the last 72 hours. Urine analysis:    Component Value Date/Time   COLORURINE YELLOW 09/11/2017 0709   APPEARANCEUR HAZY (A) 09/11/2017 0709   LABSPEC 1.020 09/11/2017 0709   PHURINE 6.0 09/11/2017 0709   GLUCOSEU NEGATIVE 09/11/2017 0709   HGBUR SMALL (A) 09/11/2017 0709   BILIRUBINUR NEGATIVE 09/11/2017 0709   KETONESUR NEGATIVE 09/11/2017 0709   PROTEINUR NEGATIVE 09/11/2017 0709   UROBILINOGEN 0.2 06/27/2015 0225   NITRITE NEGATIVE 09/11/2017 0709   LEUKOCYTESUR LARGE (A) 09/11/2017 0709     Ripudeep Rai M.D. [facility] 10/17/2017, 11:19 AM  Pager: [phone] Between 7am to 7pm - call Pager - [phone]  After 7pm go to [email or web address] - password [email or web address]  Call night coverage person covering after 7pm

## 2017-10-18 LAB — BASIC METABOLIC PANEL
ANION GAP: 8 (ref 5–15)
BUN: 9 mg/dL (ref 6–20)
CALCIUM: 9.5 mg/dL (ref 8.9–10.3)
CHLORIDE: 98 mmol/L — AB (ref 101–111)
CO2: 29 mmol/L (ref 22–32)
Creatinine, Ser: 0.54 mg/dL (ref 0.44–1.00)
GFR calc non Af Amer: >60 mL/min (ref >60)
Glucose, Bld: 89 mg/dL (ref 65–99)
Potassium: 4 mmol/L (ref 3.5–5.1)
Sodium: 135 mmol/L (ref 135–145)

## 2017-10-18 LAB — CBC
HEMATOCRIT: 36.8 % (ref 36.0–46.0)
HEMOGLOBIN: 12.1 g/dL (ref 12.0–15.0)
MCH: 30.6 pg (ref 26.0–34.0)
MCHC: 32.9 g/dL (ref 30.0–36.0)
MCV: 92.9 fL (ref 78.0–100.0)
Platelets: 450 10*3/uL — ABNORMAL HIGH (ref 150–400)
RBC: 3.96 MIL/uL (ref 3.87–5.11)
RDW: 14.3 % (ref 11.5–15.5)
WBC: 7.3 10*3/uL (ref 4.0–10.5)

## 2017-10-18 NOTE — Progress Notes (Signed)
[facility]                                                                              Patient Demographics  [name], is a 51 y.o. female, DOB - [birth year], OYD:[id number]  Admit date - 09/11/2017   Admitting Physician Bonnielee Haff, MD  Outpatient Primary MD for the patient is [name], [name], NP  Outpatient specialists:   LOS - 37  days   Medical records reviewed and are as summarized below:    Chief Complaint  Patient presents with  . Neck Pain  . Back Pain       Brief summary   51 year old female with history of PTSD, depression, intravenous drug use, chronic back pain status post previous back surgery, ADD, bilateral ovarian cysts, PID, sciatica presented on 09/11/2017 with severe back and neck pain. MRI of the T and L-spine revealed ventral epidural abscess. She was found to have MSSA bacteremia. She was put on IV antibiotics. ID and neurosurgery were consulted. Patient was transferred to [facility]. She developed urinary retention on 09/15/2017 for which she required emergent C3-C7 laminectomy for decompression of spinal cord and debridement of epidural abscess. She had TEE on 09/20/2017 which was negative for vegetations. ID recommends at least 6 weeks of IV antibiotics and last negative Blood Cx were 09/19/17.   Assessment & Plan    Principal Problem:   Severe sepsis ([place]) in the setting of MSSA bacteremia, cervical and thoracic spine abscess -Sepsis physiology resolved, hemodynamically stable.  - Leukocytosis resolved, afebrile -Continue IV cefazolin, rifampin for total of 6 weeks per Dr. Algis Downs note on 09/27/17. - last blood cultures negative on 11/25, will tentatively finish antibiotics on 10/31/17 (will need pharmacy to verify stop date)  Active Problems: MSSA bacteremia -TEE negative for endocarditis 11/26 -Blood cultures negative from 11/25 -Continue IV antibiotics for 6 weeks and remain inpatient due to  history of IV drug abuse  Spinal epidural abscess extending from C2-T8 -No neurological deficits, status post emergent C3-C7 laminectomy for decompression of the spinal cord, debridement of epidural abscess on 11/21 -Continue IV cefazolin, rifampin per ID recommendations -LFTs normal except mild elevation of alkaline phosphatase which is improving    Hypokalemia -Potassium 4.0  History of polysubstance abuse, IV drug use -Currently stable, cannot be discharged with IV access or PICC line, hence will remain inpatient while on IV antibiotics  Chronic pain syndrome -Currently stable, controlled, continue duloxetine, Robaxin, oxycodone, OxyContin with bowel regimen   Obesity, Class III, BMI 40-49.9 (morbid obesity) ([place]) -Counseled on diet and weight control.,  Nutrition consult  Reactive thrombocytosis -Last seen by Dr. Jana Hakim on 11/18 to rule out lymphoma.    Hepatitis C -Outpatient follow-up with ID/GI once IV antibiotics is complete  Normocytic iron deficiency anemia -H&H stable and baseline, outpatient follow-up with Dr. Jana Hakim -H&H stable  Hyponatremia -Resolved   Code Status: Full CODE STATUS DVT Prophylaxis: Heparin subcu Family Communication: Discussed in detail with the patient, all imaging results, lab results explained to the patient  Disposition Plan:   Time Spent in minutes 15 minutes  Procedures:  Emergent C3-C7 laminectomy for decompression of  spinal cord and debridement of epidural abscesson 09/15/2017  TEE on 09/20/2017: No evidence of vegetations  Transthoracic ECHO on 09/12/2017 Study Conclusions  - Left ventricle: The cavity size was normal. Wall thickness was normal. Systolic function was normal. The estimated ejection fraction was in the range of 55% to 60%. Wall motion was normal; there were no regional wall motion abnormalities. Left ventricular diastolic function parameters were normal. - Mitral valve: There was mild  regurgitation. - Pulmonary arteries: Systolic pressure was mildly increased. PA peak pressure: 39 mm Hg (S).    Consultants:    Hematology/Oncology  Infectious Diseases  Neurosurgery  Cardiology  Antimicrobials:      Medications  Scheduled Meds: . Chlorhexidine Gluconate Cloth  6 each Topical Q2200  . DULoxetine  60 mg Oral Daily  . heparin injection (subcutaneous)  5,000 Units Subcutaneous Q8H  . oxyCODONE  10 mg Oral Q12H  . rifampin  300 mg Oral Q12H  . senna-docusate  1 tablet Oral BID  . sodium chloride flush  10-40 mL Intracatheter Q12H   Continuous Infusions: .  ceFAZolin (ANCEF) IV Stopped (10/18/17 0546)   PRN Meds:.acetaminophen **OR** acetaminophen, hydrALAZINE, hydrOXYzine, menthol-cetylpyridinium **OR** phenol, methocarbamol, ondansetron (ZOFRAN) IV **OR** ondansetron **OR** ondansetron, oxyCODONE, polyethylene glycol, sodium chloride flush   Antibiotics   Anti-infectives (From admission, onward)   Start     Dose/Rate Route Frequency Ordered Stop   09/27/17 1800  ceFAZolin (ANCEF) IVPB 2g/100 mL premix     2 g 200 mL/hr over 30 Minutes Intravenous Every 8 hours 09/27/17 1622     09/16/17 2300  nafcillin 2 g in dextrose 5 % 100 mL IVPB  Status:  Discontinued     2 g 200 mL/hr over 30 Minutes Intravenous Every 4 hours 09/15/17 2035 09/15/17 2303   09/16/17 1400  rifampin (RIFADIN) capsule 300 mg     300 mg Oral Every 12 hours 09/16/17 1344     09/15/17 2315  nafcillin 2 g in dextrose 5 % 100 mL IVPB  Status:  Discontinued     2 g 200 mL/hr over 30 Minutes Intravenous Every 4 hours 09/15/17 2303 09/27/17 1622   09/15/17 1237  bacitracin 50,000 Units in sodium chloride irrigation 0.9 % 500 mL irrigation  Status:  Discontinued       As needed 09/15/17 1237 09/15/17 1433   09/13/17 1200  nafcillin 2 g in dextrose 5 % 100 mL IVPB  Status:  Discontinued     2 g 200 mL/hr over 30 Minutes Intravenous Every 4 hours 09/13/17 0918 09/15/17 2035   09/12/17  1400  ceFAZolin (ANCEF) IVPB 2g/100 mL premix  Status:  Discontinued     2 g 200 mL/hr over 30 Minutes Intravenous Every 8 hours 09/12/17 0848 09/13/17 0918   09/12/17 1000  vancomycin (VANCOCIN) 1,250 mg in sodium chloride 0.9 % 250 mL IVPB  Status:  Discontinued     1,250 mg 166.7 mL/hr over 90 Minutes Intravenous Every 12 hours 09/11/17 1904 09/12/17 0828   09/12/17 0600  ceFEPIme (MAXIPIME) 1 g in dextrose 5 % 50 mL IVPB  Status:  Discontinued     1 g 100 mL/hr over 30 Minutes Intravenous Every 8 hours 09/11/17 1904 09/12/17 0827   09/11/17 1830  vancomycin (VANCOCIN) 2,000 mg in sodium chloride 0.9 % 500 mL IVPB     2,000 mg 250 mL/hr over 120 Minutes Intravenous  Once 09/11/17 1758 09/12/17 0026   09/11/17 1815  ceFEPIme (MAXIPIME) 2 g in dextrose 5 %  50 mL IVPB     2 g 100 mL/hr over 30 Minutes Intravenous  Once 09/11/17 1809 09/11/17 2208   09/11/17 1800  piperacillin-tazobactam (ZOSYN) IVPB 3.375 g  Status:  Discontinued     3.375 g 12.5 mL/hr over 240 Minutes Intravenous Every 8 hours 09/11/17 1757 09/11/17 1808        Subjective:   Jisell Majer was seen and examined today.  No complaints.   Patient denies dizziness, chest pain, shortness of breath, abdominal pain, N/V/D/C, new weakness, numbess, tingling. No acute events overnight.    Objective:   Vitals:   10/17/17 1600 10/17/17 1933 10/18/17 0335 10/18/17 0848  BP: 116/78 122/88 119/78 123/81  Pulse: 82 71 69 68  Resp: 12  15 16   Temp: 51.7 F (61.6 C) 98.9 F (37.2 C) 98 F (36.7 C) 98.1 F (36.7 C)  TempSrc: Oral Oral Oral Oral  SpO2: 95% 100%  98%  Weight:      Height:        Intake/Output Summary (Last 24 hours) at 10/18/2017 1200 Last data filed at 10/17/2017 1330 Gross per 24 hour  Intake 120 ml  Output -  Net 120 ml     Wt Readings from Last 3 Encounters:  09/21/17 111.1 kg (244 lb 14.9 oz)  09/11/17 112.5 kg (248 lb)  09/11/17 112.5 kg (248 lb)     Exam    General: Alert and  oriented x 3, NAD  Eyes:   HEENT:    Cardiovascular: S1 S2 auscultated, no rubs, murmurs or gallops. Regular rate and rhythm. No pedal edema b/l  Respiratory: Clear to auscultation bilaterally, no wheezing, rales or rhonchi  Gastrointestinal: Soft, nontender, nondistended, + bowel sounds  Ext: no pedal edema bilaterally  Neuro: no new deficits  Musculoskeletal: No digital cyanosis, clubbing  Skin: No rashes  Psych: Normal affect and demeanor, alert and oriented x3   Data Reviewed:  I have personally reviewed following labs and imaging studies  Micro Results Recent Results (from the past 240 hour(s))  MRSA PCR Screening     Status: None   Collection Time: 10/14/17  3:53 PM  Result Value Ref Range Status   MRSA by PCR NEGATIVE NEGATIVE Final    Comment:        The GeneXpert MRSA Assay (FDA approved for NASAL specimens only), is one component of a comprehensive MRSA colonization surveillance program. It is not intended to diagnose MRSA infection nor to guide or monitor treatment for MRSA infections.     Radiology Reports Ct Angio Chest Pe W Or Wo Contrast  Result Date: 09/30/2017 CLINICAL DATA:  51 y/o F; shortness of breath, PE suspected, high pretest probability. EXAM: CT ANGIOGRAPHY CHEST WITH CONTRAST TECHNIQUE: Multidetector CT imaging of the chest was performed using the standard protocol during bolus administration of intravenous contrast. Multiplanar CT image reconstructions and MIPs were obtained to evaluate the vascular anatomy. CONTRAST:  53 cc Isovue 370 COMPARISON:  09/11/2017 CT of the chest. 09/01/2017 thoracic spine MRI. FINDINGS: Cardiovascular: Satisfactory opacification of the pulmonary arteries to the segmental level. No evidence of pulmonary embolism. Normal heart size. No pericardial effusion. Mediastinum/Nodes: Prominent mediastinal lymph nodes are likely reactive. Normal thyroid gland. Normal thoracic esophagus. Right central venous catheter tip  extends to the lower SVC. Lungs/Pleura: Small left pleural effusion. Upper Abdomen: No acute abnormality. Musculoskeletal: Paravertebral fluid collection predominantly on the right aspect of vertebral bodies and with a smaller component anterior and left lateral to the vertebral bodies extends  from the T3-4 level to the T8 level and demonstrates a faint rim of peripheral enhancement (series 5, image 50 and series 8, image 77). There are faint lucent changes within the T7-8 endplates which may represent discitis osteomyelitis. Review of the MIP images confirms the above findings. IMPRESSION: 1. No pulmonary embolus identified. 2. Paravertebral rim enhancing collection extending from T3-4 to the T8 level, greater on the right, compatible with abscess, is decreased in its craniocaudal extent from prior thoracic MRI. 3. Faint lucency within T7-8 anterior endplates may represent developing osteomyelitis. No gross bony destructive changes. 4. Small left pleural effusion. Electronically Signed   By: Kristine Garbe M.D.   On: 09/30/2017 22:15   Ct Abdomen Pelvis W Contrast  Result Date: 09/21/2017 CLINICAL DATA:  Bacteremia. Known thoracic and cervical abscess with cervical decompression 6 days ago. EXAM: CT ABDOMEN AND PELVIS WITH CONTRAST TECHNIQUE: Multidetector CT imaging of the abdomen and pelvis was performed using the standard protocol following bolus administration of intravenous contrast. CONTRAST:  100 cc Isovue-300 intravenous COMPARISON:  09/11/2017 FINDINGS: Lower chest: Progression of posterior mediastinal disease centered at T7 and T8 with discrete paravertebral abscesses bilaterally and ventrally. The collection on the right (which may communicate to the left) measures up to 21 x 41 mm on axial slices. Small bilateral pleural effusion that are dependent and without visible septation or pleural thickening. Lower lobe atelectasis. Hepatobiliary: No focal liver abnormality.No evidence of biliary  obstruction or stone. Pancreas: Unremarkable. Spleen: Unremarkable. Adrenals/Urinary Tract: Negative adrenals. No hydronephrosis or stone. Subcentimeter presumed cyst in the left kidney. Gas in the urinary bladder without bladder wall thickening or inflammation. Stomach/Bowel:  No obstruction. No appendicitis. Vascular/Lymphatic: No acute vascular abnormality. No mass or adenopathy. Reproductive:Hysterectomy. Tubular cystic structure in the right pelvis measuring up to 2 cm in thickness, length measurement limited by redundant shape. This is the appearance of hydrosalpinx. No wall thickening or surrounding fat inflammation. Other: No ascites or pneumoperitoneum. Musculoskeletal: No progressive or visible destructive changes in the thoracic spine. L3-S1 solid fusion. Known epidural abscess in the thoracic spine not well assessed by CT. IMPRESSION: 1. Progressive paravertebral infection at T7 and T8 with collections/abscess measuring up to 4 cm. No osteomyelitis by CT. 2. Small bilateral pleural effusions without visible complexity. Lower lobe atelectasis. 3. No abscess or other acute finding in the abdomen. 4. Right hydrosalpinx. Recommend pelvic ultrasound confirmation after convalescence. 5. Gas in the urinary bladder without wall thickening, please correlate with urinalysis. Electronically Signed   By: Monte Fantasia M.D.   On: 09/21/2017 21:12    Lab Data:  CBC: Recent Labs  Lab 10/12/17 0830 10/14/17 0500 10/18/17 0500  WBC 7.8 8.1 7.3  NEUTROABS 4.3 3.4  --   HGB 11.4* 12.0 12.1  HCT 35.5* 36.3 36.8  MCV 93.7 92.6 92.9  PLT 428* 459* 833*   Basic Metabolic Panel: Recent Labs  Lab 10/12/17 0830 10/14/17 0500 10/18/17 0500  NA 135 137 135  K 3.7 4.1 4.0  CL 98* 100* 98*  CO2 28 30 29   GLUCOSE 157* 83 89  BUN 7 8 9   CREATININE 0.65 0.58 0.54  CALCIUM 8.9 9.1 9.5  MG 1.7 2.0  --   PHOS 4.4 5.4*  --    GFR: Estimated Creatinine Clearance: 108.6 mL/min (by C-G formula based on SCr  of 0.54 mg/dL). Liver Function Tests: Recent Labs  Lab 10/12/17 0830 10/14/17 0500  AST 38 33  ALT 20 19  ALKPHOS 155* 152*  BILITOT 0.8  0.8  PROT 8.1 8.7*  ALBUMIN 2.8* 3.0*   No results for input(s): LIPASE, AMYLASE in the last 168 hours. No results for input(s): AMMONIA in the last 168 hours. Coagulation Profile: No results for input(s): INR, PROTIME in the last 168 hours. Cardiac Enzymes: No results for input(s): CKTOTAL, CKMB, CKMBINDEX, TROPONINI in the last 168 hours. BNP (last 3 results) No results for input(s): PROBNP in the last 8760 hours. HbA1C: No results for input(s): HGBA1C in the last 72 hours. CBG: No results for input(s): GLUCAP in the last 168 hours. Lipid Profile: No results for input(s): CHOL, HDL, LDLCALC, TRIG, CHOLHDL, LDLDIRECT in the last 72 hours. Thyroid Function Tests: No results for input(s): TSH, T4TOTAL, FREET4, T3FREE, THYROIDAB in the last 72 hours. Anemia Panel: No results for input(s): VITAMINB12, FOLATE, FERRITIN, TIBC, IRON, RETICCTPCT in the last 72 hours. Urine analysis:    Component Value Date/Time   COLORURINE YELLOW 09/11/2017 0709   APPEARANCEUR HAZY (A) 09/11/2017 0709   LABSPEC 1.020 09/11/2017 0709   PHURINE 6.0 09/11/2017 0709   GLUCOSEU NEGATIVE 09/11/2017 0709   HGBUR SMALL (A) 09/11/2017 0709   BILIRUBINUR NEGATIVE 09/11/2017 0709   KETONESUR NEGATIVE 09/11/2017 0709   PROTEINUR NEGATIVE 09/11/2017 0709   UROBILINOGEN 0.2 06/27/2015 0225   NITRITE NEGATIVE 09/11/2017 0709   LEUKOCYTESUR LARGE (A) 09/11/2017 0709     Daiquan Resnik M.D. [facility] 10/18/2017, 12:00 PM  Pager: [phone] Between 7am to 7pm - call Pager - [phone]  After 7pm go to [email or web address] - password [email or web address]  Call night coverage person covering after 7pm

## 2017-10-19 NOTE — Progress Notes (Signed)
[facility]                                                                              Patient Demographics  [name], is a 51 y.o. female, DOB - 06/04/1966, IOX:[id number]  Admit date - 09/11/2017   Admitting Physician Bonnielee Haff, MD  Outpatient Primary MD for the patient is [name], [name], NP  Outpatient specialists:   LOS - 38  days   Medical records reviewed and are as summarized below:    Chief Complaint  Patient presents with  . Neck Pain  . Back Pain       Brief summary   51 year old female with history of PTSD, depression, intravenous drug use, chronic back pain status post previous back surgery, ADD, bilateral ovarian cysts, PID, sciatica presented on 09/11/2017 with severe back and neck pain. MRI of the T and L-spine revealed ventral epidural abscess. She was found to have MSSA bacteremia. She was put on IV antibiotics. ID and neurosurgery were consulted. Patient was transferred to [facility]. She developed urinary retention on 09/15/2017 for which she required emergent C3-C7 laminectomy for decompression of spinal cord and debridement of epidural abscess. She had TEE on 09/20/2017 which was negative for vegetations. ID recommends at least 6 weeks of IV antibiotics and last negative Blood Cx were 09/19/17.   Assessment & Plan    Principal Problem:   Severe sepsis ([place]) in the setting of MSSA bacteremia, cervical and thoracic spine abscess -Sepsis physiology resolved, hemodynamically stable.  - Leukocytosis resolved, afebrile -Continue IV cefazolin, rifampin for total of 6 weeks per Dr. Algis Downs note on 09/27/17. 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 -No neurological deficits, status post emergent C3-C7 laminectomy for decompression of the spinal cord, debridement of epidural abscess on 11/21 -Continue IV cefazolin, rifampin per ID recommendations -LFTs normal except mild elevation of alkaline phosphatase which is improving    Hypokalemia -Potassium 4.0  History of polysubstance abuse, IV drug use -Currently stable, cannot be discharged with IV access or PICC line, hence will remain inpatient while on IV antibiotics  Chronic pain syndrome -Currently stable, controlled, continue duloxetine, Robaxin, oxycodone, OxyContin with bowel regimen   Obesity, Class III, BMI 40-49.9 (morbid obesity) ([place]) -Counseled on diet and weight control.,  Nutrition consult  Reactive thrombocytosis -Last seen by Dr. Jana Hakim on 11/18 to rule out lymphoma.    Hepatitis C -Outpatient follow-up with ID/GI once IV antibiotics is complete  Normocytic iron deficiency anemia -H&H stable and baseline, outpatient follow-up with Dr. Jana Hakim -H&H stable  Hyponatremia -Resolved   Code Status: Full CODE STATUS DVT Prophylaxis: Heparin subcu Family Communication: Discussed in detail with the patient, all imaging results, lab results explained to the patient  Disposition Plan:   Time Spent in minutes 15 minutes  Procedures:  Emergent C3-C7 laminectomy  for decompression of spinal cord and debridement of epidural abscesson 09/15/2017  TEE on 09/20/2017: No evidence of vegetations  Transthoracic ECHO on 09/12/2017 Study Conclusions  - Left ventricle: The cavity size was normal. Wall thickness was normal. Systolic function was normal. The estimated ejection fraction was in the range of 55% to 60%. Wall motion was normal; there were no regional wall motion abnormalities. Left ventricular diastolic function parameters were normal. - Mitral valve: There  was mild regurgitation. - Pulmonary arteries: Systolic pressure was mildly increased. PA peak pressure: 39 mm Hg (S).    Consultants:    Hematology/Oncology  Infectious Diseases  Neurosurgery  Cardiology  Antimicrobials:      Medications  Scheduled Meds: . Chlorhexidine Gluconate Cloth  6 each Topical Q2200  . DULoxetine  60 mg Oral Daily  . heparin injection (subcutaneous)  5,000 Units Subcutaneous Q8H  . oxyCODONE  10 mg Oral Q12H  . rifampin  300 mg Oral Q12H  . senna-docusate  1 tablet Oral BID  . sodium chloride flush  10-40 mL Intracatheter Q12H   Continuous Infusions: .  ceFAZolin (ANCEF) IV Stopped (10/19/17 0720)   PRN Meds:.acetaminophen **OR** acetaminophen, hydrALAZINE, hydrOXYzine, menthol-cetylpyridinium **OR** phenol, methocarbamol, ondansetron (ZOFRAN) IV **OR** ondansetron **OR** ondansetron, oxyCODONE, polyethylene glycol, sodium chloride flush   Antibiotics   Anti-infectives (From admission, onward)   Start     Dose/Rate Route Frequency Ordered Stop   09/27/17 1800  ceFAZolin (ANCEF) IVPB 2g/100 mL premix     2 g 200 mL/hr over 30 Minutes Intravenous Every 8 hours 09/27/17 1622     09/16/17 2300  nafcillin 2 g in dextrose 5 % 100 mL IVPB  Status:  Discontinued     2 g 200 mL/hr over 30 Minutes Intravenous Every 4 hours 09/15/17 2035 09/15/17 2303   09/16/17 1400  rifampin (RIFADIN) capsule 300 mg     300 mg Oral Every 12 hours 09/16/17 1344     09/15/17 2315  nafcillin 2 g in dextrose 5 % 100 mL IVPB  Status:  Discontinued     2 g 200 mL/hr over 30 Minutes Intravenous Every 4 hours 09/15/17 2303 09/27/17 1622   09/15/17 1237  bacitracin 50,000 Units in sodium chloride irrigation 0.9 % 500 mL irrigation  Status:  Discontinued       As needed 09/15/17 1237 09/15/17 1433   09/13/17 1200  nafcillin 2 g in dextrose 5 % 100 mL IVPB  Status:  Discontinued     2 g 200 mL/hr over 30 Minutes Intravenous Every 4 hours 09/13/17 0918 09/15/17 2035    09/12/17 1400  ceFAZolin (ANCEF) IVPB 2g/100 mL premix  Status:  Discontinued     2 g 200 mL/hr over 30 Minutes Intravenous Every 8 hours 09/12/17 0848 09/13/17 0918   09/12/17 1000  vancomycin (VANCOCIN) 1,250 mg in sodium chloride 0.9 % 250 mL IVPB  Status:  Discontinued     1,250 mg 166.7 mL/hr over 90 Minutes Intravenous Every 12 hours 09/11/17 1904 09/12/17 0828   09/12/17 0600  ceFEPIme (MAXIPIME) 1 g in dextrose 5 % 50 mL IVPB  Status:  Discontinued     1 g 100 mL/hr over 30 Minutes Intravenous Every 8 hours 09/11/17 1904 09/12/17 0827   09/11/17 1830  vancomycin (VANCOCIN) 2,000 mg in sodium chloride 0.9 % 500 mL IVPB     2,000 mg 250 mL/hr over 120 Minutes Intravenous  Once 09/11/17 1758 09/12/17 0026   09/11/17 1815  ceFEPIme (MAXIPIME) 2 g  in dextrose 5 % 50 mL IVPB     2 g 100 mL/hr over 30 Minutes Intravenous  Once 09/11/17 1809 09/11/17 2208   09/11/17 1800  piperacillin-tazobactam (ZOSYN) IVPB 3.375 g  Status:  Discontinued     3.375 g 12.5 mL/hr over 240 Minutes Intravenous Every 8 hours 09/11/17 1757 09/11/17 1808        Subjective:   Serin Thornell was seen and examined today.  No complaints, watching TV.     Patient denies dizziness, chest pain, shortness of breath, abdominal pain, N/V/D/C, new weakness, numbess, tingling. No acute events overnight.  No fevers  Objective:   Vitals:   10/18/17 2031 10/18/17 2314 10/19/17 0307 10/19/17 0957  BP: 106/77 126/81 108/76 (!) 124/91  Pulse:  72 66 85  Resp: 16 18 16 18   Temp: 98.4 F (36.9 C) 97.6 F (36.4 C) 97.7 F (36.5 C) 98.3 F (36.8 C)  TempSrc: Oral Oral Oral Oral  SpO2:  99% 96% 98%  Weight:      Height:        Intake/Output Summary (Last 24 hours) at 10/19/2017 1240 Last data filed at 10/19/2017 0800 Gross per 24 hour  Intake 1400 ml  Output -  Net 1400 ml     Wt Readings from Last 3 Encounters:  09/21/17 111.1 kg (244 lb 14.9 oz)  09/11/17 112.5 kg (248 lb)  09/11/17 112.5 kg (248 lb)       Exam   General: Alert and oriented x 3, NAD  Eyes:   HEENT:   Cardiovascular: S1 S2clear RRR No pedal edema b/l  Respiratory: CTAB  Gastrointestinal: Soft, NT, ND  Ext: no pedal edema bilaterally  Neuro: no new deficit   musculoskeletal: No digital cyanosis, clubbing  Skin: No rashes  Psych: pleasant and cooperative, normal affect   Data Reviewed:  I have personally reviewed following labs and imaging studies  Micro Results Recent Results (from the past 240 hour(s))  MRSA PCR Screening     Status: None   Collection Time: 10/14/17  3:53 PM  Result Value Ref Range Status   MRSA by PCR NEGATIVE NEGATIVE Final    Comment:        The GeneXpert MRSA Assay (FDA approved for NASAL specimens only), is one component of a comprehensive MRSA colonization surveillance program. It is not intended to diagnose MRSA infection nor to guide or monitor treatment for MRSA infections.     Radiology Reports Ct Angio Chest Pe W Or Wo Contrast  Result Date: 09/30/2017 CLINICAL DATA:  51 y/o F; shortness of breath, PE suspected, high pretest probability. EXAM: CT ANGIOGRAPHY CHEST WITH CONTRAST TECHNIQUE: Multidetector CT imaging of the chest was performed using the standard protocol during bolus administration of intravenous contrast. Multiplanar CT image reconstructions and MIPs were obtained to evaluate the vascular anatomy. CONTRAST:  53 cc Isovue 370 COMPARISON:  09/11/2017 CT of the chest. 09/01/2017 thoracic spine MRI. FINDINGS: Cardiovascular: Satisfactory opacification of the pulmonary arteries to the segmental level. No evidence of pulmonary embolism. Normal heart size. No pericardial effusion. Mediastinum/Nodes: Prominent mediastinal lymph nodes are likely reactive. Normal thyroid gland. Normal thoracic esophagus. Right central venous catheter tip extends to the lower SVC. Lungs/Pleura: Small left pleural effusion. Upper Abdomen: No acute abnormality. Musculoskeletal:  Paravertebral fluid collection predominantly on the right aspect of vertebral bodies and with a smaller component anterior and left lateral to the vertebral bodies extends from the T3-4 level to the T8 level and demonstrates a faint rim  of peripheral enhancement (series 5, image 50 and series 8, image 77). There are faint lucent changes within the T7-8 endplates which may represent discitis osteomyelitis. Review of the MIP images confirms the above findings. IMPRESSION: 1. No pulmonary embolus identified. 2. Paravertebral rim enhancing collection extending from T3-4 to the T8 level, greater on the right, compatible with abscess, is decreased in its craniocaudal extent from prior thoracic MRI. 3. Faint lucency within T7-8 anterior endplates may represent developing osteomyelitis. No gross bony destructive changes. 4. Small left pleural effusion. Electronically Signed   By: Kristine Garbe M.D.   On: 09/30/2017 22:15   Ct Abdomen Pelvis W Contrast  Result Date: 09/21/2017 CLINICAL DATA:  Bacteremia. Known thoracic and cervical abscess with cervical decompression 6 days ago. EXAM: CT ABDOMEN AND PELVIS WITH CONTRAST TECHNIQUE: Multidetector CT imaging of the abdomen and pelvis was performed using the standard protocol following bolus administration of intravenous contrast. CONTRAST:  100 cc Isovue-300 intravenous COMPARISON:  09/11/2017 FINDINGS: Lower chest: Progression of posterior mediastinal disease centered at T7 and T8 with discrete paravertebral abscesses bilaterally and ventrally. The collection on the right (which may communicate to the left) measures up to 21 x 41 mm on axial slices. Small bilateral pleural effusion that are dependent and without visible septation or pleural thickening. Lower lobe atelectasis. Hepatobiliary: No focal liver abnormality.No evidence of biliary obstruction or stone. Pancreas: Unremarkable. Spleen: Unremarkable. Adrenals/Urinary Tract: Negative adrenals. No  hydronephrosis or stone. Subcentimeter presumed cyst in the left kidney. Gas in the urinary bladder without bladder wall thickening or inflammation. Stomach/Bowel:  No obstruction. No appendicitis. Vascular/Lymphatic: No acute vascular abnormality. No mass or adenopathy. Reproductive:Hysterectomy. Tubular cystic structure in the right pelvis measuring up to 2 cm in thickness, length measurement limited by redundant shape. This is the appearance of hydrosalpinx. No wall thickening or surrounding fat inflammation. Other: No ascites or pneumoperitoneum. Musculoskeletal: No progressive or visible destructive changes in the thoracic spine. L3-S1 solid fusion. Known epidural abscess in the thoracic spine not well assessed by CT. IMPRESSION: 1. Progressive paravertebral infection at T7 and T8 with collections/abscess measuring up to 4 cm. No osteomyelitis by CT. 2. Small bilateral pleural effusions without visible complexity. Lower lobe atelectasis. 3. No abscess or other acute finding in the abdomen. 4. Right hydrosalpinx. Recommend pelvic ultrasound confirmation after convalescence. 5. Gas in the urinary bladder without wall thickening, please correlate with urinalysis. Electronically Signed   By: Monte Fantasia M.D.   On: 09/21/2017 21:12    Lab Data:  CBC: Recent Labs  Lab 10/14/17 0500 10/18/17 0500  WBC 8.1 7.3  NEUTROABS 3.4  --   HGB 12.0 12.1  HCT 36.3 36.8  MCV 92.6 92.9  PLT 459* 778*   Basic Metabolic Panel: Recent Labs  Lab 10/14/17 0500 10/18/17 0500  NA 137 135  K 4.1 4.0  CL 100* 98*  CO2 30 29  GLUCOSE 83 89  BUN 8 9  CREATININE 0.58 0.54  CALCIUM 9.1 9.5  MG 2.0  --   PHOS 5.4*  --    GFR: Estimated Creatinine Clearance: 108.6 mL/min (by C-G formula based on SCr of 0.54 mg/dL). Liver Function Tests: Recent Labs  Lab 10/14/17 0500  AST 33  ALT 19  ALKPHOS 152*  BILITOT 0.8  PROT 8.7*  ALBUMIN 3.0*   No results for input(s): LIPASE, AMYLASE in the last 168  hours. No results for input(s): AMMONIA in the last 168 hours. Coagulation Profile: No results for input(s): INR, PROTIME in  the last 168 hours. Cardiac Enzymes: No results for input(s): CKTOTAL, CKMB, CKMBINDEX, TROPONINI in the last 168 hours. BNP (last 3 results) No results for input(s): PROBNP in the last 8760 hours. HbA1C: No results for input(s): HGBA1C in the last 72 hours. CBG: No results for input(s): GLUCAP in the last 168 hours. Lipid Profile: No results for input(s): CHOL, HDL, LDLCALC, TRIG, CHOLHDL, LDLDIRECT in the last 72 hours. Thyroid Function Tests: No results for input(s): TSH, T4TOTAL, FREET4, T3FREE, THYROIDAB in the last 72 hours. Anemia Panel: No results for input(s): VITAMINB12, FOLATE, FERRITIN, TIBC, IRON, RETICCTPCT in the last 72 hours. Urine analysis:    Component Value Date/Time   COLORURINE YELLOW 09/11/2017 0709   APPEARANCEUR HAZY (A) 09/11/2017 0709   LABSPEC 1.020 09/11/2017 0709   PHURINE 6.0 09/11/2017 0709   GLUCOSEU NEGATIVE 09/11/2017 0709   HGBUR SMALL (A) 09/11/2017 0709   BILIRUBINUR NEGATIVE 09/11/2017 0709   KETONESUR NEGATIVE 09/11/2017 0709   PROTEINUR NEGATIVE 09/11/2017 0709   UROBILINOGEN 0.2 06/27/2015 0225   NITRITE NEGATIVE 09/11/2017 0709   LEUKOCYTESUR LARGE (A) 09/11/2017 0709     Frandy Basnett M.D. [facility] 10/19/2017, 12:40 PM  Pager: [phone] Between 7am to 7pm - call Pager - [phone]  After 7pm go to [email or web address] - password [email or web address]  Call night coverage person covering after 7pm

## 2017-10-20 NOTE — Progress Notes (Signed)
Planning to treat for 8 weeks First negative blood cx = 11/25 Antibiotics should end on 11/13/17  OPAT orders placed.  Harvel Quale  10/20/2017 12:12 PM

## 2017-10-20 NOTE — Progress Notes (Signed)
[facility]                                                                              Patient Demographics  [name], is a 51 y.o. female, DOB - 05-01-1966, GNF:[id number]  Admit date - 09/11/2017   Admitting Physician Bonnielee Haff, MD  Outpatient Primary MD for the patient is [name], [name], NP  Outpatient specialists:   LOS - 39  days   Medical records reviewed and are as summarized below:    Chief Complaint  Patient presents with  . Neck Pain  . Back Pain       Brief summary   51 year old female with history of PTSD, depression, intravenous drug use, chronic back pain status post previous back surgery, ADD, bilateral ovarian cysts, PID, sciatica presented on 09/11/2017 with severe back and neck pain. MRI of the T and L-spine revealed ventral epidural abscess. She was found to have MSSA bacteremia. She was put on IV antibiotics. ID and neurosurgery were consulted. Patient was transferred to [facility]. She developed urinary retention on 09/15/2017 for which she required emergent C3-C7 laminectomy for decompression of spinal cord and debridement of epidural abscess. She had TEE on 09/20/2017 which was negative for vegetations. ID recommends at least 6 weeks of IV antibiotics and last negative Blood Cx were 09/19/17.   Assessment & Plan    Principal Problem:   Severe sepsis ([place]) in the setting of MSSA bacteremia, cervical and thoracic spine abscess -Sepsis physiology resolved, hemodynamically stable.  - Leukocytosis resolved, afebrile -Continue IV cefazolin, rifampin for total of 6 weeks per Dr. Algis Downs note on 09/27/17. - last blood cultures negative on 11/25, will tentatively finish antibiotics on 10/31/17, requested pharmacy to verify stop date -Denies any acute issues.    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  History of polysubstance abuse, IV drug use -Currently stable, cannot be discharged with IV access or PICC line, hence will remain inpatient while on IV antibiotics  Chronic pain syndrome -Currently stable, controlled, continue duloxetine, Robaxin, oxycodone, OxyContin with bowel regimen   Obesity, Class III, BMI 40-49.9 (morbid obesity) ([place]) -Counseled on diet and weight control.,  Nutrition consult  Reactive thrombocytosis -Last seen by Dr. Jana Hakim on 11/18 to rule out lymphoma.    Hepatitis C -Outpatient follow-up with ID/GI once IV antibiotics is complete  Normocytic iron deficiency anemia -H&H stable and baseline, outpatient follow-up with Dr. Jana Hakim -Follow CBC in a.m.  Hyponatremia -Resolved   Code Status: Full CODE STATUS DVT Prophylaxis: Heparin subcu Family Communication: Discussed in detail with the patient, all imaging results, lab results explained to the patient  Disposition Plan:   Time Spent in minutes 15 minutes  Procedures:  Emergent C3-C7  laminectomy for decompression of spinal cord and debridement of epidural abscesson 09/15/2017  TEE on 09/20/2017: No evidence of vegetations  Transthoracic ECHO on 09/12/2017 Study Conclusions  - Left ventricle: The cavity size was normal. Wall thickness was normal. Systolic function was normal. The estimated ejection fraction was in the range of 55% to 60%. Wall motion was normal; there were no regional wall motion abnormalities. Left ventricular diastolic function parameters were normal. - Mitral valve: There was  mild regurgitation. - Pulmonary arteries: Systolic pressure was mildly increased. PA peak pressure: 39 mm Hg (S).    Consultants:    Hematology/Oncology  Infectious Diseases  Neurosurgery  Cardiology  Antimicrobials:      Medications  Scheduled Meds: . Chlorhexidine Gluconate Cloth  6 each Topical Q2200  . DULoxetine  60 mg Oral Daily  . heparin injection (subcutaneous)  5,000 Units Subcutaneous Q8H  . oxyCODONE  10 mg Oral Q12H  . rifampin  300 mg Oral Q12H  . senna-docusate  1 tablet Oral BID  . sodium chloride flush  10-40 mL Intracatheter Q12H   Continuous Infusions: .  ceFAZolin (ANCEF) IV Stopped (10/20/17 0708)   PRN Meds:.acetaminophen **OR** acetaminophen, hydrALAZINE, hydrOXYzine, menthol-cetylpyridinium **OR** phenol, methocarbamol, ondansetron (ZOFRAN) IV **OR** ondansetron **OR** ondansetron, oxyCODONE, polyethylene glycol, sodium chloride flush   Antibiotics   Anti-infectives (From admission, onward)   Start     Dose/Rate Route Frequency Ordered Stop   09/27/17 1800  ceFAZolin (ANCEF) IVPB 2g/100 mL premix     2 g 200 mL/hr over 30 Minutes Intravenous Every 8 hours 09/27/17 1622     09/16/17 2300  nafcillin 2 g in dextrose 5 % 100 mL IVPB  Status:  Discontinued     2 g 200 mL/hr over 30 Minutes Intravenous Every 4 hours 09/15/17 2035 09/15/17 2303   09/16/17 1400  rifampin (RIFADIN) capsule 300 mg     300 mg Oral Every 12 hours 09/16/17 1344     09/15/17 2315  nafcillin 2 g in dextrose 5 % 100 mL IVPB  Status:  Discontinued     2 g 200 mL/hr over 30 Minutes Intravenous Every 4 hours 09/15/17 2303 09/27/17 1622   09/15/17 1237  bacitracin 50,000 Units in sodium chloride irrigation 0.9 % 500 mL irrigation  Status:  Discontinued       As needed 09/15/17 1237 09/15/17 1433   09/13/17 1200  nafcillin 2 g in dextrose 5 % 100 mL IVPB  Status:  Discontinued     2 g 200 mL/hr over 30 Minutes Intravenous Every 4 hours 09/13/17 0918 09/15/17 2035    09/12/17 1400  ceFAZolin (ANCEF) IVPB 2g/100 mL premix  Status:  Discontinued     2 g 200 mL/hr over 30 Minutes Intravenous Every 8 hours 09/12/17 0848 09/13/17 0918   09/12/17 1000  vancomycin (VANCOCIN) 1,250 mg in sodium chloride 0.9 % 250 mL IVPB  Status:  Discontinued     1,250 mg 166.7 mL/hr over 90 Minutes Intravenous Every 12 hours 09/11/17 1904 09/12/17 0828   09/12/17 0600  ceFEPIme (MAXIPIME) 1 g in dextrose 5 % 50 mL IVPB  Status:  Discontinued     1 g 100 mL/hr over 30 Minutes Intravenous Every 8 hours 09/11/17 1904 09/12/17 0827   09/11/17 1830  vancomycin (VANCOCIN) 2,000 mg in sodium chloride 0.9 % 500 mL IVPB     2,000 mg 250 mL/hr over 120 Minutes Intravenous  Once 09/11/17 1758 09/12/17 0026   09/11/17 1815  ceFEPIme (MAXIPIME) 2  g in dextrose 5 % 50 mL IVPB     2 g 100 mL/hr over 30 Minutes Intravenous  Once 09/11/17 1809 09/11/17 2208   09/11/17 1800  piperacillin-tazobactam (ZOSYN) IVPB 3.375 g  Status:  Discontinued     3.375 g 12.5 mL/hr over 240 Minutes Intravenous Every 8 hours 09/11/17 1757 09/11/17 1808        Subjective:   [name] was seen and examined today.  No complaints.  Patient denies dizziness, chest pain, shortness of breath, abdominal pain, N/V/D/C, new weakness, numbess, tingling.  No acute issues overnight, no fevers  Objective:   Vitals:   10/19/17 2327 10/20/17 0335 10/20/17 0723 10/20/17 1121  BP: 113/80 105/75 116/85 116/72  Pulse: 72 71 63 64  Resp: 16     Temp: 98.2 F (36.8 C) 98.1 F (36.7 C) 97.7 F (36.5 C) 98 F (36.7 C)  TempSrc: Oral Oral Oral Oral  SpO2: 98% 96% 100% 99%  Weight:      Height:        Intake/Output Summary (Last 24 hours) at 10/20/2017 1125 Last data filed at 10/20/2017 4765 Gross per 24 hour  Intake 1020 ml  Output 2 ml  Net 1018 ml     Wt Readings from Last 3 Encounters:  09/21/17 111.1 kg (244 lb 14.9 oz)  09/11/17 112.5 kg (248 lb)  09/11/17 112.5 kg (248 lb)      Exam   General: Alert and oriented x 3, NAD  Eyes:   HEENT:    Cardiovascular: S1 S2 clear, RRR  Respiratory: CTA B  Gastrointestinal: Soft, NT, ND, NBS  Ext: no pedal edema bilaterally  Neuro: no neuro deficits  Musculoskeletal: No digital cyanosis, clubbing  Skin: No rashes  Psych: Normal affect and demeanor, alert and oriented x3    Data Reviewed:  I have personally reviewed following labs and imaging studies  Micro Results Recent Results (from the past 240 hour(s))  MRSA PCR Screening     Status: None   Collection Time: 10/14/17  3:53 PM  Result Value Ref Range Status   MRSA by PCR NEGATIVE NEGATIVE Final    Comment:        The GeneXpert MRSA Assay (FDA approved for NASAL specimens only), is one component of a comprehensive MRSA colonization surveillance program. It is not intended to diagnose MRSA infection nor to guide or monitor treatment for MRSA infections.     Radiology Reports Ct Angio Chest Pe W Or Wo Contrast  Result Date: 09/30/2017 CLINICAL DATA:  51 y/o F; shortness of breath, PE suspected, high pretest probability. EXAM: CT ANGIOGRAPHY CHEST WITH CONTRAST TECHNIQUE: Multidetector CT imaging of the chest was performed using the standard protocol during bolus administration of intravenous contrast. Multiplanar CT image reconstructions and MIPs were obtained to evaluate the vascular anatomy. CONTRAST:  53 cc Isovue 370 COMPARISON:  09/11/2017 CT of the chest. 09/01/2017 thoracic spine MRI. FINDINGS: Cardiovascular: Satisfactory opacification of the pulmonary arteries to the segmental level. No evidence of pulmonary embolism. Normal heart size. No pericardial effusion. Mediastinum/Nodes: Prominent mediastinal lymph nodes are likely reactive. Normal thyroid gland. Normal thoracic esophagus. Right central venous catheter tip extends to the lower SVC. Lungs/Pleura: Small left pleural effusion. Upper Abdomen: No acute abnormality. Musculoskeletal:  Paravertebral fluid collection predominantly on the right aspect of vertebral bodies and with a smaller component anterior and left lateral to the vertebral bodies extends from the T3-4 level to the T8 level and demonstrates a faint rim of peripheral  enhancement (series 5, image 50 and series 8, image 77). There are faint lucent changes within the T7-8 endplates which may represent discitis osteomyelitis. Review of the MIP images confirms the above findings. IMPRESSION: 1. No pulmonary embolus identified. 2. Paravertebral rim enhancing collection extending from T3-4 to the T8 level, greater on the right, compatible with abscess, is decreased in its craniocaudal extent from prior thoracic MRI. 3. Faint lucency within T7-8 anterior endplates may represent developing osteomyelitis. No gross bony destructive changes. 4. Small left pleural effusion. Electronically Signed   By: Kristine Garbe M.D.   On: 09/30/2017 22:15   Ct Abdomen Pelvis W Contrast  Result Date: 09/21/2017 CLINICAL DATA:  Bacteremia. Known thoracic and cervical abscess with cervical decompression 6 days ago. EXAM: CT ABDOMEN AND PELVIS WITH CONTRAST TECHNIQUE: Multidetector CT imaging of the abdomen and pelvis was performed using the standard protocol following bolus administration of intravenous contrast. CONTRAST:  100 cc Isovue-300 intravenous COMPARISON:  09/11/2017 FINDINGS: Lower chest: Progression of posterior mediastinal disease centered at T7 and T8 with discrete paravertebral abscesses bilaterally and ventrally. The collection on the right (which may communicate to the left) measures up to 21 x 41 mm on axial slices. Small bilateral pleural effusion that are dependent and without visible septation or pleural thickening. Lower lobe atelectasis. Hepatobiliary: No focal liver abnormality.No evidence of biliary obstruction or stone. Pancreas: Unremarkable. Spleen: Unremarkable. Adrenals/Urinary Tract: Negative adrenals. No  hydronephrosis or stone. Subcentimeter presumed cyst in the left kidney. Gas in the urinary bladder without bladder wall thickening or inflammation. Stomach/Bowel:  No obstruction. No appendicitis. Vascular/Lymphatic: No acute vascular abnormality. No mass or adenopathy. Reproductive:Hysterectomy. Tubular cystic structure in the right pelvis measuring up to 2 cm in thickness, length measurement limited by redundant shape. This is the appearance of hydrosalpinx. No wall thickening or surrounding fat inflammation. Other: No ascites or pneumoperitoneum. Musculoskeletal: No progressive or visible destructive changes in the thoracic spine. L3-S1 solid fusion. Known epidural abscess in the thoracic spine not well assessed by CT. IMPRESSION: 1. Progressive paravertebral infection at T7 and T8 with collections/abscess measuring up to 4 cm. No osteomyelitis by CT. 2. Small bilateral pleural effusions without visible complexity. Lower lobe atelectasis. 3. No abscess or other acute finding in the abdomen. 4. Right hydrosalpinx. Recommend pelvic ultrasound confirmation after convalescence. 5. Gas in the urinary bladder without wall thickening, please correlate with urinalysis. Electronically Signed   By: Monte Fantasia M.D.   On: 09/21/2017 21:12    Lab Data:  CBC: Recent Labs  Lab 10/14/17 0500 10/18/17 0500  WBC 8.1 7.3  NEUTROABS 3.4  --   HGB 12.0 12.1  HCT 36.3 36.8  MCV 92.6 92.9  PLT 459* 387*   Basic Metabolic Panel: Recent Labs  Lab 10/14/17 0500 10/18/17 0500  NA 137 135  K 4.1 4.0  CL 100* 98*  CO2 30 29  GLUCOSE 83 89  BUN 8 9  CREATININE 0.58 0.54  CALCIUM 9.1 9.5  MG 2.0  --   PHOS 5.4*  --    GFR: Estimated Creatinine Clearance: 108.6 mL/min (by C-G formula based on SCr of 0.54 mg/dL). Liver Function Tests: Recent Labs  Lab 10/14/17 0500  AST 33  ALT 19  ALKPHOS 152*  BILITOT 0.8  PROT 8.7*  ALBUMIN 3.0*   No results for input(s): LIPASE, AMYLASE in the last 168  hours. No results for input(s): AMMONIA in the last 168 hours. Coagulation Profile: No results for input(s): INR, PROTIME in the last  168 hours. Cardiac Enzymes: No results for input(s): CKTOTAL, CKMB, CKMBINDEX, TROPONINI in the last 168 hours. BNP (last 3 results) No results for input(s): PROBNP in the last 8760 hours. HbA1C: No results for input(s): HGBA1C in the last 72 hours. CBG: No results for input(s): GLUCAP in the last 168 hours. Lipid Profile: No results for input(s): CHOL, HDL, LDLCALC, TRIG, CHOLHDL, LDLDIRECT in the last 72 hours. Thyroid Function Tests: No results for input(s): TSH, T4TOTAL, FREET4, T3FREE, THYROIDAB in the last 72 hours. Anemia Panel: No results for input(s): VITAMINB12, FOLATE, FERRITIN, TIBC, IRON, RETICCTPCT in the last 72 hours. Urine analysis:    Component Value Date/Time   COLORURINE YELLOW 09/11/2017 0709   APPEARANCEUR HAZY (A) 09/11/2017 0709   LABSPEC 1.020 09/11/2017 0709   PHURINE 6.0 09/11/2017 0709   GLUCOSEU NEGATIVE 09/11/2017 0709   HGBUR SMALL (A) 09/11/2017 0709   BILIRUBINUR NEGATIVE 09/11/2017 0709   KETONESUR NEGATIVE 09/11/2017 0709   PROTEINUR NEGATIVE 09/11/2017 0709   UROBILINOGEN 0.2 06/27/2015 0225   NITRITE NEGATIVE 09/11/2017 0709   LEUKOCYTESUR LARGE (A) 09/11/2017 0709     Ripudeep Rai M.D. [facility] 10/20/2017, 11:25 AM  Pager: [phone] Between 7am to 7pm - call Pager - [phone]  After 7pm go to [email or web address] - password [email or web address]  Call night coverage person covering after 7pm

## 2017-10-21 LAB — CBC
HCT: 36.3 % (ref 36.0–46.0)
HEMOGLOBIN: 12.2 g/dL (ref 12.0–15.0)
MCH: 30.9 pg (ref 26.0–34.0)
MCHC: 33.6 g/dL (ref 30.0–36.0)
MCV: 91.9 fL (ref 78.0–100.0)
Platelets: 426 10*3/uL — ABNORMAL HIGH (ref 150–400)
RBC: 3.95 MIL/uL (ref 3.87–5.11)
RDW: 13.9 % (ref 11.5–15.5)
WBC: 7 10*3/uL (ref 4.0–10.5)

## 2017-10-21 LAB — COMPREHENSIVE METABOLIC PANEL
ALBUMIN: 3.1 g/dL — AB (ref 3.5–5.0)
ALK PHOS: 129 U/L — AB (ref 38–126)
ALT: 22 U/L (ref 14–54)
AST: 34 U/L (ref 15–41)
Anion gap: 7 (ref 5–15)
BUN: 8 mg/dL (ref 6–20)
CALCIUM: 9.3 mg/dL (ref 8.9–10.3)
CO2: 29 mmol/L (ref 22–32)
CREATININE: 0.6 mg/dL (ref 0.44–1.00)
Chloride: 99 mmol/L — ABNORMAL LOW (ref 101–111)
GFR calc non Af Amer: >60 mL/min (ref >60)
GLUCOSE: 91 mg/dL (ref 65–99)
Potassium: 4 mmol/L (ref 3.5–5.1)
SODIUM: 135 mmol/L (ref 135–145)
Total Bilirubin: 0.8 mg/dL (ref 0.3–1.2)
Total Protein: 8.4 g/dL — ABNORMAL HIGH (ref 6.5–8.1)

## 2017-10-21 NOTE — Progress Notes (Signed)
PROGRESS NOTE    [name]  EPP:[id number] DOB: [birth year] DOA: 09/11/2017 PCP: Marliss Coots, NP   Brief Narrative: [name] is a 51 y.o. female with history of PTSD, depression, intravenous drug use, chronic back pain status post previous back surgery, ADD, bilateral ovarian cysts, PID, sciatica presented on 09/11/2017 with severe back and neck pain. MRI of the T and L-spine revealed ventral epidural abscess. She was found to have MSSA bacteremia. She was put on IV antibiotics. ID and neurosurgery were consulted. Patient was transferred to [facility]. She developed urinary retention on 09/15/2017 for which she required emergent C3-C7 laminectomy for decompression of spinal cord and debridement of epidural abscess. She had TEE on 09/20/2017 which was negative for vegetations. ID recommends at least 6 weeks of IV antibiotics and last negative Blood Cx were 09/19/17.   Assessment & Plan:   Principal Problem:   Severe sepsis ([place]) Active Problems:   Hypokalemia   Lymphadenopathy   Back pain   Hyponatremia   Right ovarian cyst   Hypotension   MSSA bacteremia   Abscess in epidural space of thoracic spine   Mediastinal mass   Obesity, Class III, BMI 40-49.9 (morbid obesity) ([facility])   Leucocytosis   Neck pain   Abscess in epidural space of cervical spine   MSSA bacteremia -TEE negative for endocarditis 11/26 -Blood cultures negative from 11/25 -Clarified with ID on 12/27; plan is for Ancef and rifampin for 8 weeks total. Patient will get treatment in the hospital secondary to history of IV drug use.  Spinal epidural abscess extending from C2-T8 -No neurological deficits, status post emergent C3-C7 laminectomy for decompression of the spinal cord, debridement of epidural abscess on 11/21 -Continue IV cefazolin, rifampin per ID recommendations  History of polysubstance abuse, IV drug use -Currently stable, cannot be discharged with IV access or  PICC line, hence will remain inpatient while on IV antibiotics  Chronic pain syndrome -Currently stable, controlled, continue duloxetine, Robaxin, oxycodone, OxyContin with bowel regimen   Obesity, Class III, BMI 40-49.9 (morbid obesity) ([place]) -Counseled on diet and weight control.,  Nutrition consult  Reactive thrombocytosis -Last seen by Dr. Jana Hakim on 11/18 to rule out lymphoma.    Hepatitis C -Outpatient follow-up with ID/GI once IV antibiotics is complete  Normocytic iron deficiency anemia -H&H stable and baseline, outpatient follow-up with Dr. Jana Hakim -Follow CBC in a.m.  Hyponatremia -Resolved  Elevated alkaline phosphatase No abdominal pain. Resolving. Unknown etiology.   DVT prophylaxis: Heparin Code Status: Full code Family Communication: None at bedside Disposition Plan: Discharge home once IV antibiotics therapy completed   Consultants:   Infectious disease  Hematology  Neurosurgery  Cardiology  Procedures:  Emergent C3-C7 laminectomy for decompression of spinal cord and debridement of epidural abscesson 09/15/2017  TEE on 09/20/2017: No evidence of vegetations  Transthoracic ECHO on 09/12/2017 Study Conclusions  - Left ventricle: The cavity size was normal. Wall thickness was normal. Systolic function was normal. The estimated ejection fraction was in the range of 55% to 60%. Wall motion was normal; there were no regional wall motion abnormalities. Left ventricular diastolic function parameters were normal. - Mitral valve: There was mild regurgitation. - Pulmonary arteries: Systolic pressure was mildly increased. PA peak pressure: 39 mm Hg (S).     Antimicrobials:  Cefazolin  Rifampin  Nafcillin    Subjective: No concerns today  Objective: Vitals:   10/20/17 2341 10/21/17 0340 10/21/17 0736 10/21/17 1134  BP: 120/81 118/82 114/79 108/77  Pulse:  62 63 65 65  Resp:      Temp: 97.8 F (36.6 C) 98.2 F  (36.8 C) 97.6 F (36.4 C) 98 F (36.7 C)  TempSrc: Oral Oral Oral Oral  SpO2: 100% 95% 99% 95%  Weight:      Height:        Intake/Output Summary (Last 24 hours) at 10/21/2017 1351 Last data filed at 10/21/2017 2202 Gross per 24 hour  Intake 540 ml  Output -  Net 540 ml   Filed Weights   09/13/17 0730 09/14/17 0831 09/21/17 0400  Weight: 117.4 kg (258 lb 13.1 oz) 115.8 kg (255 lb 4.7 oz) 111.1 kg (244 lb 14.9 oz)    Examination:  General exam: Appears calm and comfortable Respiratory system: Clear to auscultation. Respiratory effort normal. Cardiovascular system: S1 & S2 heard, RRR. No murmurs, rubs, gallops or clicks. Gastrointestinal system: Abdomen is nondistended, soft and nontender. No organomegaly or masses felt. Normal bowel sounds heard. Central nervous system: Alert and oriented. No focal neurological deficits. Extremities: No edema. No calf tenderness Skin: No cyanosis. No rashes Psychiatry: Judgement and insight appear normal. Mood & affect appropriate.     Data Reviewed: I have personally reviewed following labs and imaging studies  CBC: Recent Labs  Lab 10/18/17 0500 10/21/17 0500  WBC 7.3 7.0  HGB 12.1 12.2  HCT 36.8 36.3  MCV 92.9 91.9  PLT 450* 542*   Basic Metabolic Panel: Recent Labs  Lab 10/18/17 0500 10/21/17 0500  NA 135 135  K 4.0 4.0  CL 98* 99*  CO2 29 29  GLUCOSE 89 91  BUN 9 8  CREATININE 0.54 0.60  CALCIUM 9.5 9.3   GFR: Estimated Creatinine Clearance: 108.6 mL/min (by C-G formula based on SCr of 0.6 mg/dL). Liver Function Tests: Recent Labs  Lab 10/21/17 0500  AST 34  ALT 22  ALKPHOS 129*  BILITOT 0.8  PROT 8.4*  ALBUMIN 3.1*   No results for input(s): LIPASE, AMYLASE in the last 168 hours. No results for input(s): AMMONIA in the last 168 hours. Coagulation Profile: No results for input(s): INR, PROTIME in the last 168 hours. Cardiac Enzymes: No results for input(s): CKTOTAL, CKMB, CKMBINDEX, TROPONINI in the  last 168 hours. BNP (last 3 results) No results for input(s): PROBNP in the last 8760 hours. HbA1C: No results for input(s): HGBA1C in the last 72 hours. CBG: No results for input(s): GLUCAP in the last 168 hours. Lipid Profile: No results for input(s): CHOL, HDL, LDLCALC, TRIG, CHOLHDL, LDLDIRECT in the last 72 hours. Thyroid Function Tests: No results for input(s): TSH, T4TOTAL, FREET4, T3FREE, THYROIDAB in the last 72 hours. Anemia Panel: No results for input(s): VITAMINB12, FOLATE, FERRITIN, TIBC, IRON, RETICCTPCT in the last 72 hours. Sepsis Labs: No results for input(s): PROCALCITON, LATICACIDVEN in the last 168 hours.  Recent Results (from the past 240 hour(s))  MRSA PCR Screening     Status: None   Collection Time: 10/14/17  3:53 PM  Result Value Ref Range Status   MRSA by PCR NEGATIVE NEGATIVE Final    Comment:        The GeneXpert MRSA Assay (FDA approved for NASAL specimens only), is one component of a comprehensive MRSA colonization surveillance program. It is not intended to diagnose MRSA infection nor to guide or monitor treatment for MRSA infections.          Radiology Studies: No results found.      Scheduled Meds: . Chlorhexidine Gluconate Cloth  6 each Topical  Q2200  . DULoxetine  60 mg Oral Daily  . heparin injection (subcutaneous)  5,000 Units Subcutaneous Q8H  . oxyCODONE  10 mg Oral Q12H  . rifampin  300 mg Oral Q12H  . senna-docusate  1 tablet Oral BID  . sodium chloride flush  10-40 mL Intracatheter Q12H   Continuous Infusions: .  ceFAZolin (ANCEF) IV Stopped (10/21/17 0721)     LOS: 40 days     Cordelia Poche, MD [facility] 10/21/2017, 1:51 PM Pager: [phone]  If 7PM-7AM, please contact night-coverage [email or web address] Password [email or web address] 10/21/2017, 1:51 PM

## 2017-10-22 NOTE — Progress Notes (Deleted)
Pharmacy Antibiotic Note: (Correction to earlier note)  Planning to treat for 6 weeks First negative blood cx = 11/25 Antibiotics should end on 10/31/17  OPAT orders placed.  Albertina Parr, PharmD., BCPS Clinical Pharmacist Pager [phone]

## 2017-10-22 NOTE — Progress Notes (Signed)
PROGRESS NOTE    [name]  IOX:[id number] DOB: 1966-04-19 DOA: 09/11/2017 PCP: Marliss Coots, NP   Chief Complaint  Patient presents with  . Neck Pain  . Back Pain    Brief Narrative:  [name] is a 51 y.o. female with history of PTSD, depression, intravenous drug use, chronic back pain status post previous back surgery, ADD, bilateral ovarian cysts, PID, sciatica presented on 09/11/2017 with severe back and neck pain. MRI of the T and L-spine revealed ventral epidural abscess. She was found to have MSSA bacteremia. She was put on IV antibiotics. ID and neurosurgery were consulted. Patient was transferred to [facility]. She developed urinary retention on 09/15/2017 for which she required emergent C3-C7 laminectomy for decompression of spinal cord and debridement of epidural abscess. She had TEE on 09/20/2017 which was negative for vegetations. ID recommends at least 6 weeks of IV antibiotics and last negative Blood Cx were 09/19/17.  Assessment & Plan   MSSA bacteremia -TEE negative for endocarditis on 09/20/17 -blood cultures show no growth from 09/19/17 -Previous hospitalist discussed with infectious disease on 10/21/2017, plan for Ancef and rifampin for 8 weeks total -requires hospital stay secondary to history of IV drug abuse  Spinal epidural abscess extending from C2-T8 -No neurological deficits, status post emergent C3-C7 laminectomy for decompression of the spinal cord, debridement of epidural abscess on 11/21 -Continue IV cefazolin, rifampin per ID recommendations- likely to continue through 11/13/2017  History of polysubstance abuse, IV drug use -Currently stable, cannot be discharged with IV access or PICC line, hence will remain inpatient while on IV antibiotics  Chronic pain syndrome -Currently stable, controlled, continue duloxetine, Robaxin, oxycodone, OxyContin with bowel regimen  Obesity, Class III, BMI 40-49.9 (morbid  obesity) ([place]) -Counseled on diet and weight control., Nutrition consult  Reactive thrombocytosis -Last seen by Dr. Jana Hakim on 11/18 to rule out lymphoma.   Hepatitis C -Outpatient follow-up with ID/GI once IV antibiotics is complete  Normocytic iron deficiency anemia -H&H stable and baseline, outpatient follow-up with Dr. Jana Hakim -Follow CBC in a.m.  Hyponatremia -Resolved  Elevated alkaline phosphatase -No abdominal pain. Resolving. Unknown etiology.  DVT Prophylaxis  heparin  Code Status: Full  Family Communication: None at bedside  Disposition Plan: Admitted, pending completion of IV antibiotics   Consultants Infectious disease Hematology Neurosurgery Cardiology  Procedures  Emergent C3-C7 laminectomy for decompression of spinal cord and debridement of epidural abscesson 09/15/2017  TEE on 09/20/2017: No evidence of vegetations  Transthoracic ECHO on 09/12/2017  Antibiotics   Anti-infectives (From admission, onward)   Start     Dose/Rate Route Frequency Ordered Stop   09/27/17 1800  ceFAZolin (ANCEF) IVPB 2g/100 mL premix     2 g 200 mL/hr over 30 Minutes Intravenous Every 8 hours 09/27/17 1622     09/16/17 2300  nafcillin 2 g in dextrose 5 % 100 mL IVPB  Status:  Discontinued     2 g 200 mL/hr over 30 Minutes Intravenous Every 4 hours 09/15/17 2035 09/15/17 2303   09/16/17 1400  rifampin (RIFADIN) capsule 300 mg     300 mg Oral Every 12 hours 09/16/17 1344     09/15/17 2315  nafcillin 2 g in dextrose 5 % 100 mL IVPB  Status:  Discontinued     2 g 200 mL/hr over 30 Minutes Intravenous Every 4 hours 09/15/17 2303 09/27/17 1622   09/15/17 1237  bacitracin 50,000 Units in sodium chloride irrigation 0.9 % 500 mL irrigation  Status:  Discontinued       As needed 09/15/17 1237 09/15/17 1433   09/13/17 1200  nafcillin 2 g in dextrose 5 % 100 mL IVPB  Status:  Discontinued     2 g 200 mL/hr over 30 Minutes Intravenous Every 4 hours 09/13/17 0918  09/15/17 2035   09/12/17 1400  ceFAZolin (ANCEF) IVPB 2g/100 mL premix  Status:  Discontinued     2 g 200 mL/hr over 30 Minutes Intravenous Every 8 hours 09/12/17 0848 09/13/17 0918   09/12/17 1000  vancomycin (VANCOCIN) 1,250 mg in sodium chloride 0.9 % 250 mL IVPB  Status:  Discontinued     1,250 mg 166.7 mL/hr over 90 Minutes Intravenous Every 12 hours 09/11/17 1904 09/12/17 0828   09/12/17 0600  ceFEPIme (MAXIPIME) 1 g in dextrose 5 % 50 mL IVPB  Status:  Discontinued     1 g 100 mL/hr over 30 Minutes Intravenous Every 8 hours 09/11/17 1904 09/12/17 0827   09/11/17 1830  vancomycin (VANCOCIN) 2,000 mg in sodium chloride 0.9 % 500 mL IVPB     2,000 mg 250 mL/hr over 120 Minutes Intravenous  Once 09/11/17 1758 09/12/17 0026   09/11/17 1815  ceFEPIme (MAXIPIME) 2 g in dextrose 5 % 50 mL IVPB     2 g 100 mL/hr over 30 Minutes Intravenous  Once 09/11/17 1809 09/11/17 2208   09/11/17 1800  piperacillin-tazobactam (ZOSYN) IVPB 3.375 g  Status:  Discontinued     3.375 g 12.5 mL/hr over 240 Minutes Intravenous Every 8 hours 09/11/17 1757 09/11/17 1808      Subjective:   [name] seen and examined today.  Has no complaints today. Denies chest pain, short of breath, abdominal pain, nausea vomiting, diarrhea or constipation  Objective:   Vitals:   10/21/17 1932 10/21/17 2323 10/22/17 0312 10/22/17 0740  BP: 129/88 (!) 130/91 120/84 111/77  Pulse: 68 66 73 67  Resp: 16 18 18    Temp: 98.2 F (36.8 C) 98.2 F (36.8 C) 98.1 F (36.7 C) 98.5 F (36.9 C)  TempSrc: Oral Oral Oral Oral  SpO2: 100% 99% 96% 97%  Weight:      Height:        Intake/Output Summary (Last 24 hours) at 10/22/2017 0808 Last data filed at 10/22/2017 0602 Gross per 24 hour  Intake 1020 ml  Output -  Net 1020 ml   Filed Weights   09/13/17 0730 09/14/17 0831 09/21/17 0400  Weight: 117.4 kg (258 lb 13.1 oz) 115.8 kg (255 lb 4.7 oz) 111.1 kg (244 lb 14.9 oz)    Exam  General: Well developed, well  nourished, NAD, appears stated age  HEENT: NCAT,  mucous membranes moist.   Cardiovascular: S1 S2 auscultated, no rubs, murmurs or gallops. Regular rate and rhythm.  Respiratory: Clear to auscultation bilaterally with equal chest rise  Abdomen: Soft, nontender, nondistended, + bowel sounds  Extremities: warm dry without cyanosis clubbing or edema  Neuro: AAOx3, nonfocal  Psych: appropriate, pleasant   Data Reviewed: I have personally reviewed following labs and imaging studies  CBC: Recent Labs  Lab 10/18/17 0500 10/21/17 0500  WBC 7.3 7.0  HGB 12.1 12.2  HCT 36.8 36.3  MCV 92.9 91.9  PLT 450* 536*   Basic Metabolic Panel: Recent Labs  Lab 10/18/17 0500 10/21/17 0500  NA 135 135  K 4.0 4.0  CL 98* 99*  CO2 29 29  GLUCOSE 89 91  BUN 9 8  CREATININE 0.54 0.60  CALCIUM 9.5 9.3  GFR: Estimated Creatinine Clearance: 108.6 mL/min (by C-G formula based on SCr of 0.6 mg/dL). Liver Function Tests: Recent Labs  Lab 10/21/17 0500  AST 34  ALT 22  ALKPHOS 129*  BILITOT 0.8  PROT 8.4*  ALBUMIN 3.1*   No results for input(s): LIPASE, AMYLASE in the last 168 hours. No results for input(s): AMMONIA in the last 168 hours. Coagulation Profile: No results for input(s): INR, PROTIME in the last 168 hours. Cardiac Enzymes: No results for input(s): CKTOTAL, CKMB, CKMBINDEX, TROPONINI in the last 168 hours. BNP (last 3 results) No results for input(s): PROBNP in the last 8760 hours. HbA1C: No results for input(s): HGBA1C in the last 72 hours. CBG: No results for input(s): GLUCAP in the last 168 hours. Lipid Profile: No results for input(s): CHOL, HDL, LDLCALC, TRIG, CHOLHDL, LDLDIRECT in the last 72 hours. Thyroid Function Tests: No results for input(s): TSH, T4TOTAL, FREET4, T3FREE, THYROIDAB in the last 72 hours. Anemia Panel: No results for input(s): VITAMINB12, FOLATE, FERRITIN, TIBC, IRON, RETICCTPCT in the last 72 hours. Urine analysis:    Component Value  Date/Time   COLORURINE YELLOW 09/11/2017 0709   APPEARANCEUR HAZY (A) 09/11/2017 0709   LABSPEC 1.020 09/11/2017 0709   PHURINE 6.0 09/11/2017 0709   GLUCOSEU NEGATIVE 09/11/2017 0709   HGBUR SMALL (A) 09/11/2017 0709   BILIRUBINUR NEGATIVE 09/11/2017 0709   KETONESUR NEGATIVE 09/11/2017 0709   PROTEINUR NEGATIVE 09/11/2017 0709   UROBILINOGEN 0.2 06/27/2015 0225   NITRITE NEGATIVE 09/11/2017 0709   LEUKOCYTESUR LARGE (A) 09/11/2017 0709   Sepsis Labs: @LABRCNTIP (procalcitonin:4,lacticidven:4)  ) Recent Results (from the past 240 hour(s))  MRSA PCR Screening     Status: None   Collection Time: 10/14/17  3:53 PM  Result Value Ref Range Status   MRSA by PCR NEGATIVE NEGATIVE Final    Comment:        The GeneXpert MRSA Assay (FDA approved for NASAL specimens only), is one component of a comprehensive MRSA colonization surveillance program. It is not intended to diagnose MRSA infection nor to guide or monitor treatment for MRSA infections.       Radiology Studies: No results found.   Scheduled Meds: . Chlorhexidine Gluconate Cloth  6 each Topical Q2200  . DULoxetine  60 mg Oral Daily  . heparin injection (subcutaneous)  5,000 Units Subcutaneous Q8H  . oxyCODONE  10 mg Oral Q12H  . rifampin  300 mg Oral Q12H  . senna-docusate  1 tablet Oral BID  . sodium chloride flush  10-40 mL Intracatheter Q12H   Continuous Infusions: .  ceFAZolin (ANCEF) IV Stopped (10/22/17 0602)     LOS: 41 days   Time Spent in minutes   30 minutes  Tyjay Galindo D.O. on 10/22/2017 at 8:08 AM  Between 7am to 7pm - Pager - [phone]  After 7pm go to [email or web address] - password [email or web address]  And look for the night coverage person covering for me after hours  [facility] Office  [phone]

## 2017-10-23 NOTE — Progress Notes (Signed)
PROGRESS NOTE    [name]  ZJQ:[id number] DOB: [birth year] DOA: 09/11/2017 PCP: Marliss Coots, NP   Chief Complaint  Patient presents with  . Neck Pain  . Back Pain    Brief Narrative:  [name] is a 51 y.o. female with history of PTSD, depression, intravenous drug use, chronic back pain status post previous back surgery, ADD, bilateral ovarian cysts, PID, sciatica presented on 09/11/2017 with severe back and neck pain. MRI of the T and L-spine revealed ventral epidural abscess. She was found to have MSSA bacteremia. She was put on IV antibiotics. ID and neurosurgery were consulted. Patient was transferred to [facility]. She developed urinary retention on 09/15/2017 for which she required emergent C3-C7 laminectomy for decompression of spinal cord and debridement of epidural abscess. She had TEE on 09/20/2017 which was negative for vegetations. ID recommends at least 6 weeks of IV antibiotics and last negative Blood Cx were 09/19/17.  Assessment & Plan   MSSA bacteremia -TEE negative for endocarditis on 09/20/17 -blood cultures show no growth from 09/19/17 -Previous hospitalist discussed with infectious disease on 10/21/2017, plan for Ancef and rifampin for 8 weeks total -requires hospital stay secondary to history of IV drug abuse  Spinal epidural abscess extending from C2-T8 -No neurological deficits, status post emergent C3-C7 laminectomy for decompression of the spinal cord, debridement of epidural abscess on 11/21 -Continue IV cefazolin, rifampin per ID recommendations- likely to continue through 11/13/2017  History of polysubstance abuse, IV drug use -Currently stable, cannot be discharged with IV access or PICC line, hence will remain inpatient while on IV antibiotics  Chronic pain syndrome -Currently stable, controlled, continue duloxetine, Robaxin, oxycodone, OxyContin with bowel regimen  Obesity, Class III, BMI 40-49.9 (morbid  obesity) ([place]) -Counseled on diet and weight control -nutrition consulted  Reactive thrombocytosis -Last seen by Dr. Jana Hakim on 11/18 to rule out lymphoma.   Hepatitis C -Outpatient follow-up with ID/GI once IV antibiotics is complete  Normocytic iron deficiency anemia -Hemoglobin stable and baseline, outpatient follow-up with Dr. Jana Hakim -will check CBC today  Hyponatremia -Resolved  Elevated alkaline phosphatase -No abdominal pain. Resolving. Unknown etiology.  DVT Prophylaxis  heparin  Code Status: Full  Family Communication: None at bedside  Disposition Plan: Admitted, pending completion of IV antibiotics- 11/13/2017  Consultants Infectious disease Hematology Neurosurgery Cardiology  Procedures  Emergent C3-C7 laminectomy for decompression of spinal cord and debridement of epidural abscesson 09/15/2017  TEE on 09/20/2017: No evidence of vegetations  Transthoracic ECHO on 09/12/2017  Antibiotics   Anti-infectives (From admission, onward)   Start     Dose/Rate Route Frequency Ordered Stop   09/27/17 1800  ceFAZolin (ANCEF) IVPB 2g/100 mL premix     2 g 200 mL/hr over 30 Minutes Intravenous Every 8 hours 09/27/17 1622     09/16/17 2300  nafcillin 2 g in dextrose 5 % 100 mL IVPB  Status:  Discontinued     2 g 200 mL/hr over 30 Minutes Intravenous Every 4 hours 09/15/17 2035 09/15/17 2303   09/16/17 1400  rifampin (RIFADIN) capsule 300 mg     300 mg Oral Every 12 hours 09/16/17 1344     09/15/17 2315  nafcillin 2 g in dextrose 5 % 100 mL IVPB  Status:  Discontinued     2 g 200 mL/hr over 30 Minutes Intravenous Every 4 hours 09/15/17 2303 09/27/17 1622   09/15/17 1237  bacitracin 50,000 Units in sodium chloride irrigation 0.9 % 500 mL irrigation  Status:  Discontinued       As needed 09/15/17 1237 09/15/17 1433   09/13/17 1200  nafcillin 2 g in dextrose 5 % 100 mL IVPB  Status:  Discontinued     2 g 200 mL/hr over 30 Minutes Intravenous Every 4 hours  09/13/17 0918 09/15/17 2035   09/12/17 1400  ceFAZolin (ANCEF) IVPB 2g/100 mL premix  Status:  Discontinued     2 g 200 mL/hr over 30 Minutes Intravenous Every 8 hours 09/12/17 0848 09/13/17 0918   09/12/17 1000  vancomycin (VANCOCIN) 1,250 mg in sodium chloride 0.9 % 250 mL IVPB  Status:  Discontinued     1,250 mg 166.7 mL/hr over 90 Minutes Intravenous Every 12 hours 09/11/17 1904 09/12/17 0828   09/12/17 0600  ceFEPIme (MAXIPIME) 1 g in dextrose 5 % 50 mL IVPB  Status:  Discontinued     1 g 100 mL/hr over 30 Minutes Intravenous Every 8 hours 09/11/17 1904 09/12/17 0827   09/11/17 1830  vancomycin (VANCOCIN) 2,000 mg in sodium chloride 0.9 % 500 mL IVPB     2,000 mg 250 mL/hr over 120 Minutes Intravenous  Once 09/11/17 1758 09/12/17 0026   09/11/17 1815  ceFEPIme (MAXIPIME) 2 g in dextrose 5 % 50 mL IVPB     2 g 100 mL/hr over 30 Minutes Intravenous  Once 09/11/17 1809 09/11/17 2208   09/11/17 1800  piperacillin-tazobactam (ZOSYN) IVPB 3.375 g  Status:  Discontinued     3.375 g 12.5 mL/hr over 240 Minutes Intravenous Every 8 hours 09/11/17 1757 09/11/17 1808      Subjective:   [name] seen and examined today.  Has no complaints today. States [birth year]. Denies chest pain, short of breath, abdominal pain, nausea vomiting, diarrhea or constipation, dizziness, headache.  Objective:   Vitals:   10/22/17 1641 10/22/17 2009 10/22/17 2334 10/23/17 0333  BP: 117/86 118/81 117/73 (!) 131/95  Pulse: 75 75 70 62  Resp:      Temp: 98.4 F (36.9 C) 97.7 F (36.5 C) 97.9 F (36.6 C) 98.3 F (36.8 C)  TempSrc: Oral Oral Oral Oral  SpO2: 98% 100% 97% 90%  Weight:      Height:        Intake/Output Summary (Last 24 hours) at 10/23/2017 0833 Last data filed at 10/22/2017 1305 Gross per 24 hour  Intake 490 ml  Output -  Net 490 ml   Filed Weights   09/13/17 0730 09/14/17 0831 09/21/17 0400  Weight: 117.4 kg (258 lb 13.1 oz) 115.8 kg (255 lb 4.7 oz) 111.1 kg (244  lb 14.9 oz)   Exam  General: Well developed, well nourished, NAD, appears stated age  51: NCAT, mucous membranes moist.   Cardiovascular: S1 S2 auscultated, RRR, no murmurs  Respiratory: Clear to auscultation bilaterally with equal chest rise  Abdomen: Soft, nontender, nondistended, + bowel sounds  Extremities: warm dry without cyanosis clubbing or edema  Neuro: AAOx3, nonfocal  Psych: Appropriate mood and affect  Data Reviewed: I have personally reviewed following labs and imaging studies  CBC: Recent Labs  Lab 10/18/17 0500 10/21/17 0500  WBC 7.3 7.0  HGB 12.1 12.2  HCT 36.8 36.3  MCV 92.9 91.9  PLT 450* 253*   Basic Metabolic Panel: Recent Labs  Lab 10/18/17 0500 10/21/17 0500  NA 135 135  K 4.0 4.0  CL 98* 99*  CO2 29 29  GLUCOSE 89 91  BUN 9 8  CREATININE 0.54 0.60  CALCIUM 9.5 9.3  GFR: Estimated Creatinine Clearance: 107.4 mL/min (by C-G formula based on SCr of 0.6 mg/dL). Liver Function Tests: Recent Labs  Lab 10/21/17 0500  AST 34  ALT 22  ALKPHOS 129*  BILITOT 0.8  PROT 8.4*  ALBUMIN 3.1*   No results for input(s): LIPASE, AMYLASE in the last 168 hours. No results for input(s): AMMONIA in the last 168 hours. Coagulation Profile: No results for input(s): INR, PROTIME in the last 168 hours. Cardiac Enzymes: No results for input(s): CKTOTAL, CKMB, CKMBINDEX, TROPONINI in the last 168 hours. BNP (last 3 results) No results for input(s): PROBNP in the last 8760 hours. HbA1C: No results for input(s): HGBA1C in the last 72 hours. CBG: No results for input(s): GLUCAP in the last 168 hours. Lipid Profile: No results for input(s): CHOL, HDL, LDLCALC, TRIG, CHOLHDL, LDLDIRECT in the last 72 hours. Thyroid Function Tests: No results for input(s): TSH, T4TOTAL, FREET4, T3FREE, THYROIDAB in the last 72 hours. Anemia Panel: No results for input(s): VITAMINB12, FOLATE, FERRITIN, TIBC, IRON, RETICCTPCT in the last 72 hours. Urine analysis:     Component Value Date/Time   COLORURINE YELLOW 09/11/2017 0709   APPEARANCEUR HAZY (A) 09/11/2017 0709   LABSPEC 1.020 09/11/2017 0709   PHURINE 6.0 09/11/2017 0709   GLUCOSEU NEGATIVE 09/11/2017 0709   HGBUR SMALL (A) 09/11/2017 0709   BILIRUBINUR NEGATIVE 09/11/2017 0709   KETONESUR NEGATIVE 09/11/2017 0709   PROTEINUR NEGATIVE 09/11/2017 0709   UROBILINOGEN 0.2 06/27/2015 0225   NITRITE NEGATIVE 09/11/2017 0709   LEUKOCYTESUR LARGE (A) 09/11/2017 0709   Sepsis Labs: @LABRCNTIP (procalcitonin:4,lacticidven:4)  ) Recent Results (from the past 240 hour(s))  MRSA PCR Screening     Status: None   Collection Time: 10/14/17  3:53 PM  Result Value Ref Range Status   MRSA by PCR NEGATIVE NEGATIVE Final    Comment:        The GeneXpert MRSA Assay (FDA approved for NASAL specimens only), is one component of a comprehensive MRSA colonization surveillance program. It is not intended to diagnose MRSA infection nor to guide or monitor treatment for MRSA infections.       Radiology Studies: No results found.   Scheduled Meds: . Chlorhexidine Gluconate Cloth  6 each Topical Q2200  . DULoxetine  60 mg Oral Daily  . heparin injection (subcutaneous)  5,000 Units Subcutaneous Q8H  . oxyCODONE  10 mg Oral Q12H  . rifampin  300 mg Oral Q12H  . senna-docusate  1 tablet Oral BID  . sodium chloride flush  10-40 mL Intracatheter Q12H   Continuous Infusions: .  ceFAZolin (ANCEF) IV 2 g (10/23/17 0649)     LOS: 42 days   Time Spent in minutes   30 minutes  Zakyla Tonche D.O. on 10/23/2017 at 8:33 AM  Between 7am to 7pm - Pager - [phone]  After 7pm go to [email or web address] - password [email or web address]  And look for the night coverage person covering for me after hours  [facility] Office  [phone]

## 2017-10-24 LAB — CBC
HCT: 37.3 % (ref 36.0–46.0)
HEMOGLOBIN: 12.3 g/dL (ref 12.0–15.0)
MCH: 30.8 pg (ref 26.0–34.0)
MCHC: 33 g/dL (ref 30.0–36.0)
MCV: 93.5 fL (ref 78.0–100.0)
Platelets: 419 10*3/uL — ABNORMAL HIGH (ref 150–400)
RBC: 3.99 MIL/uL (ref 3.87–5.11)
RDW: 14 % (ref 11.5–15.5)
WBC: 7.6 10*3/uL (ref 4.0–10.5)

## 2017-10-24 NOTE — Progress Notes (Signed)
PROGRESS NOTE    [name]  LNL:[id number] DOB: 1966-05-10 DOA: 09/11/2017 PCP: Marliss Coots, NP   Chief Complaint  Patient presents with  . Neck Pain  . Back Pain    Brief Narrative:  [name] is a 51 y.o. female with history of PTSD, depression, intravenous drug use, chronic back pain status post previous back surgery, ADD, bilateral ovarian cysts, PID, sciatica presented on 09/11/2017 with severe back and neck pain. MRI of the T and L-spine revealed ventral epidural abscess. She was found to have MSSA bacteremia. She was put on IV antibiotics. ID and neurosurgery were consulted. Patient was transferred to [facility]. She developed urinary retention on 09/15/2017 for which she required emergent C3-C7 laminectomy for decompression of spinal cord and debridement of epidural abscess. She had TEE on 09/20/2017 which was negative for vegetations. ID recommends at least 6 weeks of IV antibiotics and last negative Blood Cx were 09/19/17. Will need to continue IV antibiotics through 11/13/2016  Assessment & Plan   MSSA bacteremia -TEE negative for endocarditis on 09/20/17 -blood cultures show no growth from 09/19/17 -Previous hospitalist discussed with infectious disease on 10/21/2017, plan for Ancef and rifampin for 8 weeks total -requires hospital stay secondary to history of IV drug abuse  Spinal epidural abscess extending from C2-T8 -No neurological deficits, status post emergent C3-C7 laminectomy for decompression of the spinal cord, debridement of epidural abscess on 11/21 -Continue IV cefazolin, rifampin per ID recommendations- likely to continue through 11/13/2017  History of polysubstance abuse, IV drug use -Currently stable, cannot be discharged with IV access or PICC line, hence will remain inpatient while on IV antibiotics  Chronic pain syndrome -Currently stable, controlled, continue duloxetine, Robaxin, oxycodone, OxyContin with bowel  regimen  Obesity, Class III, BMI 40-49.9 (morbid obesity) ([place]) -Counseled on diet and weight control -nutrition consulted  Reactive thrombocytosis -Last seen by Dr. Jana Hakim on 11/18 to rule out lymphoma.   Hepatitis C -Outpatient follow-up with ID/GI once IV antibiotics is complete  Normocytic iron deficiency anemia -Hemoglobin stable and baseline, outpatient follow-up with Dr. Jana Hakim -will check CBC today  Hyponatremia -Resolved  Elevated alkaline phosphatase -No abdominal pain. Resolving. Unknown etiology.  DVT Prophylaxis  heparin  Code Status: Full  Family Communication: None at bedside  Disposition Plan: Admitted, pending completion of IV antibiotics- 11/13/2017  Consultants Infectious disease Hematology Neurosurgery Cardiology  Procedures  Emergent C3-C7 laminectomy for decompression of spinal cord and debridement of epidural abscesson 09/15/2017  TEE on 09/20/2017: No evidence of vegetations  Transthoracic ECHO on 09/12/2017  Antibiotics   Anti-infectives (From admission, onward)   Start     Dose/Rate Route Frequency Ordered Stop   09/27/17 1800  ceFAZolin (ANCEF) IVPB 2g/100 mL premix     2 g 200 mL/hr over 30 Minutes Intravenous Every 8 hours 09/27/17 1622     09/16/17 2300  nafcillin 2 g in dextrose 5 % 100 mL IVPB  Status:  Discontinued     2 g 200 mL/hr over 30 Minutes Intravenous Every 4 hours 09/15/17 2035 09/15/17 2303   09/16/17 1400  rifampin (RIFADIN) capsule 300 mg     300 mg Oral Every 12 hours 09/16/17 1344     09/15/17 2315  nafcillin 2 g in dextrose 5 % 100 mL IVPB  Status:  Discontinued     2 g 200 mL/hr over 30 Minutes Intravenous Every 4 hours 09/15/17 2303 09/27/17 1622   09/15/17 1237  bacitracin 50,000 Units in sodium chloride  irrigation 0.9 % 500 mL irrigation  Status:  Discontinued       As needed 09/15/17 1237 09/15/17 1433   09/13/17 1200  nafcillin 2 g in dextrose 5 % 100 mL IVPB  Status:  Discontinued     2  g 200 mL/hr over 30 Minutes Intravenous Every 4 hours 09/13/17 0918 09/15/17 2035   09/12/17 1400  ceFAZolin (ANCEF) IVPB 2g/100 mL premix  Status:  Discontinued     2 g 200 mL/hr over 30 Minutes Intravenous Every 8 hours 09/12/17 0848 09/13/17 0918   09/12/17 1000  vancomycin (VANCOCIN) 1,250 mg in sodium chloride 0.9 % 250 mL IVPB  Status:  Discontinued     1,250 mg 166.7 mL/hr over 90 Minutes Intravenous Every 12 hours 09/11/17 1904 09/12/17 0828   09/12/17 0600  ceFEPIme (MAXIPIME) 1 g in dextrose 5 % 50 mL IVPB  Status:  Discontinued     1 g 100 mL/hr over 30 Minutes Intravenous Every 8 hours 09/11/17 1904 09/12/17 0827   09/11/17 1830  vancomycin (VANCOCIN) 2,000 mg in sodium chloride 0.9 % 500 mL IVPB     2,000 mg 250 mL/hr over 120 Minutes Intravenous  Once 09/11/17 1758 09/12/17 0026   09/11/17 1815  ceFEPIme (MAXIPIME) 2 g in dextrose 5 % 50 mL IVPB     2 g 100 mL/hr over 30 Minutes Intravenous  Once 09/11/17 1809 09/11/17 2208   09/11/17 1800  piperacillin-tazobactam (ZOSYN) IVPB 3.375 g  Status:  Discontinued     3.375 g 12.5 mL/hr over 240 Minutes Intravenous Every 8 hours 09/11/17 1757 09/11/17 1808      Subjective:   [name] seen and examined today.  Complains of feeling sore. Was able to walk around a few times on the unit yesterday but felt tired afterwards.  Denies chest pain, short of breath, abdominal pain, nausea vomiting, diarrhea or constipation, dizziness, headache.  Objective:   Vitals:   10/23/17 1843 10/23/17 1936 10/23/17 2308 10/24/17 0308  BP: 110/71 118/86 117/79 125/80  Pulse:  71 73 64  Resp:  16 16 17   Temp:  98.2 F (36.8 C) 98 F (36.7 C) 98.2 F (36.8 C)  TempSrc:  Oral Oral Oral  SpO2:  100% 100% 100%  Weight:      Height:        Intake/Output Summary (Last 24 hours) at 10/24/2017 0844 Last data filed at 10/23/2017 0848 Gross per 24 hour  Intake 10 ml  Output -  Net 10 ml   Filed Weights   09/13/17 0730 09/14/17 0831  09/21/17 0400  Weight: 117.4 kg (258 lb 13.1 oz) 115.8 kg (255 lb 4.7 oz) 111.1 kg (244 lb 14.9 oz)   Exam  General: Well developed, well nourished, NAD, appears stated age  9: NCAT, mucous membranes moist.   Cardiovascular: S1 S2 auscultated, RRR, no murmurs  Respiratory: Clear to auscultation bilaterally with equal chest rise  Abdomen: Soft, obese, nontender, nondistended, + bowel sounds  Extremities: warm dry without cyanosis clubbing or edema  Neuro: AAOx3, nonfocal  Psych: Appropriate mood and affect, pleasant   Data Reviewed: I have personally reviewed following labs and imaging studies  CBC: Recent Labs  Lab 10/18/17 0500 10/21/17 0500 10/24/17 0416  WBC 7.3 7.0 7.6  HGB 12.1 12.2 12.3  HCT 36.8 36.3 37.3  MCV 92.9 91.9 93.5  PLT 450* 426* 607*   Basic Metabolic Panel: Recent Labs  Lab 10/18/17 0500 10/21/17 0500  NA 135 135  K  4.0 4.0  CL 98* 99*  CO2 29 29  GLUCOSE 89 91  BUN 9 8  CREATININE 0.54 0.60  CALCIUM 9.5 9.3   GFR: Estimated Creatinine Clearance: 107.4 mL/min (by C-G formula based on SCr of 0.6 mg/dL). Liver Function Tests: Recent Labs  Lab 10/21/17 0500  AST 34  ALT 22  ALKPHOS 129*  BILITOT 0.8  PROT 8.4*  ALBUMIN 3.1*   No results for input(s): LIPASE, AMYLASE in the last 168 hours. No results for input(s): AMMONIA in the last 168 hours. Coagulation Profile: No results for input(s): INR, PROTIME in the last 168 hours. Cardiac Enzymes: No results for input(s): CKTOTAL, CKMB, CKMBINDEX, TROPONINI in the last 168 hours. BNP (last 3 results) No results for input(s): PROBNP in the last 8760 hours. HbA1C: No results for input(s): HGBA1C in the last 72 hours. CBG: No results for input(s): GLUCAP in the last 168 hours. Lipid Profile: No results for input(s): CHOL, HDL, LDLCALC, TRIG, CHOLHDL, LDLDIRECT in the last 72 hours. Thyroid Function Tests: No results for input(s): TSH, T4TOTAL, FREET4, T3FREE, THYROIDAB in the last  72 hours. Anemia Panel: No results for input(s): VITAMINB12, FOLATE, FERRITIN, TIBC, IRON, RETICCTPCT in the last 72 hours. Urine analysis:    Component Value Date/Time   COLORURINE YELLOW 09/11/2017 0709   APPEARANCEUR HAZY (A) 09/11/2017 0709   LABSPEC 1.020 09/11/2017 0709   PHURINE 6.0 09/11/2017 0709   GLUCOSEU NEGATIVE 09/11/2017 0709   HGBUR SMALL (A) 09/11/2017 0709   BILIRUBINUR NEGATIVE 09/11/2017 0709   KETONESUR NEGATIVE 09/11/2017 0709   PROTEINUR NEGATIVE 09/11/2017 0709   UROBILINOGEN 0.2 06/27/2015 0225   NITRITE NEGATIVE 09/11/2017 0709   LEUKOCYTESUR LARGE (A) 09/11/2017 0709   Sepsis Labs: @LABRCNTIP (procalcitonin:4,lacticidven:4)  ) Recent Results (from the past 240 hour(s))  MRSA PCR Screening     Status: None   Collection Time: 10/14/17  3:53 PM  Result Value Ref Range Status   MRSA by PCR NEGATIVE NEGATIVE Final    Comment:        The GeneXpert MRSA Assay (FDA approved for NASAL specimens only), is one component of a comprehensive MRSA colonization surveillance program. It is not intended to diagnose MRSA infection nor to guide or monitor treatment for MRSA infections.       Radiology Studies: No results found.   Scheduled Meds: . Chlorhexidine Gluconate Cloth  6 each Topical Q2200  . DULoxetine  60 mg Oral Daily  . heparin injection (subcutaneous)  5,000 Units Subcutaneous Q8H  . oxyCODONE  10 mg Oral Q12H  . rifampin  300 mg Oral Q12H  . senna-docusate  1 tablet Oral BID  . sodium chloride flush  10-40 mL Intracatheter Q12H   Continuous Infusions: .  ceFAZolin (ANCEF) IV Stopped (10/24/17 0719)     LOS: 43 days   Time Spent in minutes   30 minutes  Amalee Olsen D.O. on 10/24/2017 at 8:44 AM  Between 7am to 7pm - Pager - [phone]  After 7pm go to [email or web address] - password [email or web address]  And look for the night coverage person covering for me after hours  [facility] Office  [phone]

## 2017-10-25 MED ORDER — DICLOFENAC SODIUM 1 % TD GEL
2.0000 g | Freq: Four times a day (QID) | TRANSDERMAL | Status: DC
  Administered 2017-10-25 – 2017-10-26 (×8): 2 g via TOPICAL
  Filled 2017-10-25: qty 100

## 2017-10-25 NOTE — Progress Notes (Signed)
PROGRESS NOTE    [name]  RXV:[id number] DOB: [birth year] DOA: 09/11/2017 PCP: Marliss Coots, NP   Chief Complaint  Patient presents with  . Neck Pain  . Back Pain    Brief Narrative:  [name] is a 51 y.o. female with history of PTSD, depression, intravenous drug use, chronic back pain status post previous back surgery, ADD, bilateral ovarian cysts, PID, sciatica presented on 09/11/2017 with severe back and neck pain. MRI of the T and L-spine revealed ventral epidural abscess. She was found to have MSSA bacteremia. She was put on IV antibiotics. ID and neurosurgery were consulted. Patient was transferred to [facility]. She developed urinary retention on 09/15/2017 for which she required emergent C3-C7 laminectomy for decompression of spinal cord and debridement of epidural abscess. She had TEE on 09/20/2017 which was negative for vegetations. ID recommends at least 6 weeks of IV antibiotics and last negative Blood Cx were 09/19/17. Will need to continue IV antibiotics through 11/13/2016  Assessment & Plan   MSSA bacteremia -TEE negative for endocarditis on 09/20/17 -blood cultures show no growth from 09/19/17 -Previous hospitalist discussed with infectious disease on 10/21/2017, plan for Ancef and rifampin for 8 weeks total -requires hospital stay secondary to history of IV drug abuse  Spinal epidural abscess extending from C2-T8 -No neurological deficits, status post emergent C3-C7 laminectomy for decompression of the spinal cord, debridement of epidural abscess on 11/21 -Continue IV cefazolin, rifampin per ID recommendations- likely to continue through 11/13/2017  Back pain -noted on the right side -Will order k-pad and voltaren gel  History of polysubstance abuse, IV drug use -Currently stable, cannot be discharged with IV access or PICC line, hence will remain inpatient while on IV antibiotics  Chronic pain syndrome -Currently stable,  controlled, continue duloxetine, Robaxin, oxycodone, OxyContin with bowel regimen  Obesity, Class III, BMI 40-49.9 (morbid obesity) ([place]) -Counseled on diet and weight control -nutrition consulted  Reactive thrombocytosis -Last seen by Dr. Jana Hakim on 11/18 to rule out lymphoma.   Hepatitis C -Outpatient follow-up with ID/GI once IV antibiotics is complete  Normocytic iron deficiency anemia -Hemoglobin stable and baseline, outpatient follow-up with Dr. Jana Hakim -will check CBC today  Hyponatremia -Resolved  Elevated alkaline phosphatase -No abdominal pain. Resolving. Unknown etiology.  DVT Prophylaxis  heparin  Code Status: Full  Family Communication: None at bedside  Disposition Plan: Admitted, pending completion of IV antibiotics- 11/13/2017  Consultants Infectious disease Hematology Neurosurgery Cardiology  Procedures  Emergent C3-C7 laminectomy for decompression of spinal cord and debridement of epidural abscesson 09/15/2017  TEE on 09/20/2017: No evidence of vegetations  Transthoracic ECHO on 09/12/2017  Antibiotics   Anti-infectives (From admission, onward)   Start     Dose/Rate Route Frequency Ordered Stop   09/27/17 1800  ceFAZolin (ANCEF) IVPB 2g/100 mL premix     2 g 200 mL/hr over 30 Minutes Intravenous Every 8 hours 09/27/17 1622 11/13/17 2359   09/16/17 2300  nafcillin 2 g in dextrose 5 % 100 mL IVPB  Status:  Discontinued     2 g 200 mL/hr over 30 Minutes Intravenous Every 4 hours 09/15/17 2035 09/15/17 2303   09/16/17 1400  rifampin (RIFADIN) capsule 300 mg     300 mg Oral Every 12 hours 09/16/17 1344     09/15/17 2315  nafcillin 2 g in dextrose 5 % 100 mL IVPB  Status:  Discontinued     2 g 200 mL/hr over 30 Minutes Intravenous Every 4 hours 09/15/17  2303 09/27/17 1622   09/15/17 1237  bacitracin 50,000 Units in sodium chloride irrigation 0.9 % 500 mL irrigation  Status:  Discontinued       As needed 09/15/17 1237 09/15/17 1433    09/13/17 1200  nafcillin 2 g in dextrose 5 % 100 mL IVPB  Status:  Discontinued     2 g 200 mL/hr over 30 Minutes Intravenous Every 4 hours 09/13/17 0918 09/15/17 2035   09/12/17 1400  ceFAZolin (ANCEF) IVPB 2g/100 mL premix  Status:  Discontinued     2 g 200 mL/hr over 30 Minutes Intravenous Every 8 hours 09/12/17 0848 09/13/17 0918   09/12/17 1000  vancomycin (VANCOCIN) 1,250 mg in sodium chloride 0.9 % 250 mL IVPB  Status:  Discontinued     1,250 mg 166.7 mL/hr over 90 Minutes Intravenous Every 12 hours 09/11/17 1904 09/12/17 0828   09/12/17 0600  ceFEPIme (MAXIPIME) 1 g in dextrose 5 % 50 mL IVPB  Status:  Discontinued     1 g 100 mL/hr over 30 Minutes Intravenous Every 8 hours 09/11/17 1904 09/12/17 0827   09/11/17 1830  vancomycin (VANCOCIN) 2,000 mg in sodium chloride 0.9 % 500 mL IVPB     2,000 mg 250 mL/hr over 120 Minutes Intravenous  Once 09/11/17 1758 09/12/17 0026   09/11/17 1815  ceFEPIme (MAXIPIME) 2 g in dextrose 5 % 50 mL IVPB     2 g 100 mL/hr over 30 Minutes Intravenous  Once 09/11/17 1809 09/11/17 2208   09/11/17 1800  piperacillin-tazobactam (ZOSYN) IVPB 3.375 g  Status:  Discontinued     3.375 g 12.5 mL/hr over 240 Minutes Intravenous Every 8 hours 09/11/17 1757 09/11/17 1808      Subjective:   [name] seen and examined today.  Complains of feeling sore and inability to take a deep breath due back pain on the right side.  Denies chest pain, short of breath, abdominal pain, nausea vomiting, diarrhea or constipation, dizziness, headache.  Objective:   Vitals:   10/24/17 0308 10/24/17 1934 10/24/17 2335 10/25/17 0300  BP: 125/80 132/85 125/84 117/78  Pulse: 64 67 71 63  Resp: 17 16  17   Temp: 98.2 F (36.8 C) 98.1 F (36.7 C) 98.2 F (36.8 C) 98 F (36.7 C)  TempSrc: Oral Oral Oral Oral  SpO2: 100% 99% 98% 99%  Weight:      Height:        Intake/Output Summary (Last 24 hours) at 10/25/2017 0743 Last data filed at 10/24/2017 1800 Gross per 24  hour  Intake 1670 ml  Output 3 ml  Net 1667 ml   Filed Weights   09/13/17 0730 09/14/17 0831 09/21/17 0400  Weight: 117.4 kg (258 lb 13.1 oz) 115.8 kg (255 lb 4.7 oz) 111.1 kg (244 lb 14.9 oz)   Exam (no change from exam on12/30/2018)  General: Well developed, well nourished, NAD, appears stated age  HEENT: NCAT, mucous membranes moist.   Cardiovascular: S1 S2 auscultated, RRR, no murmurs  Respiratory: Clear to auscultation bilaterally with equal chest rise  Abdomen: Soft, obese, nontender, nondistended, + bowel sounds  Extremities: warm dry without cyanosis clubbing or edema  Neuro: AAOx3, nonfocal  Psych: Appropriate mood and affect, pleasant   Data Reviewed: I have personally reviewed following labs and imaging studies  CBC: Recent Labs  Lab 10/21/17 0500 10/24/17 0416  WBC 7.0 7.6  HGB 12.2 12.3  HCT 36.3 37.3  MCV 91.9 93.5  PLT 426* 161*   Basic Metabolic Panel:  Recent Labs  Lab 10/21/17 0500  NA 135  K 4.0  CL 99*  CO2 29  GLUCOSE 91  BUN 8  CREATININE 0.60  CALCIUM 9.3   GFR: Estimated Creatinine Clearance: 107.4 mL/min (by C-G formula based on SCr of 0.6 mg/dL). Liver Function Tests: Recent Labs  Lab 10/21/17 0500  AST 34  ALT 22  ALKPHOS 129*  BILITOT 0.8  PROT 8.4*  ALBUMIN 3.1*   No results for input(s): LIPASE, AMYLASE in the last 168 hours. No results for input(s): AMMONIA in the last 168 hours. Coagulation Profile: No results for input(s): INR, PROTIME in the last 168 hours. Cardiac Enzymes: No results for input(s): CKTOTAL, CKMB, CKMBINDEX, TROPONINI in the last 168 hours. BNP (last 3 results) No results for input(s): PROBNP in the last 8760 hours. HbA1C: No results for input(s): HGBA1C in the last 72 hours. CBG: No results for input(s): GLUCAP in the last 168 hours. Lipid Profile: No results for input(s): CHOL, HDL, LDLCALC, TRIG, CHOLHDL, LDLDIRECT in the last 72 hours. Thyroid Function Tests: No results for input(s):  TSH, T4TOTAL, FREET4, T3FREE, THYROIDAB in the last 72 hours. Anemia Panel: No results for input(s): VITAMINB12, FOLATE, FERRITIN, TIBC, IRON, RETICCTPCT in the last 72 hours. Urine analysis:    Component Value Date/Time   COLORURINE YELLOW 09/11/2017 0709   APPEARANCEUR HAZY (A) 09/11/2017 0709   LABSPEC 1.020 09/11/2017 0709   PHURINE 6.0 09/11/2017 0709   GLUCOSEU NEGATIVE 09/11/2017 0709   HGBUR SMALL (A) 09/11/2017 0709   BILIRUBINUR NEGATIVE 09/11/2017 0709   KETONESUR NEGATIVE 09/11/2017 0709   PROTEINUR NEGATIVE 09/11/2017 0709   UROBILINOGEN 0.2 06/27/2015 0225   NITRITE NEGATIVE 09/11/2017 0709   LEUKOCYTESUR LARGE (A) 09/11/2017 0709   Sepsis Labs: @LABRCNTIP (procalcitonin:4,lacticidven:4)  ) No results found for this or any previous visit (from the past 240 hour(s)).    Radiology Studies: No results found.   Scheduled Meds: . Chlorhexidine Gluconate Cloth  6 each Topical Q2200  . DULoxetine  60 mg Oral Daily  . heparin injection (subcutaneous)  5,000 Units Subcutaneous Q8H  . oxyCODONE  10 mg Oral Q12H  . rifampin  300 mg Oral Q12H  . senna-docusate  1 tablet Oral BID  . sodium chloride flush  10-40 mL Intracatheter Q12H   Continuous Infusions: .  ceFAZolin (ANCEF) IV Stopped (10/25/17 0711)     LOS: 44 days   Time Spent in minutes   30 minutes  Peni Rupard D.O. on 10/25/2017 at 7:43 AM  Between 7am to 7pm - Pager - [phone]  After 7pm go to [email or web address] - password [email or web address]  And look for the night coverage person covering for me after hours  [facility] Office  [phone]

## 2017-10-26 NOTE — Progress Notes (Signed)
PROGRESS NOTE    [name]  EUM:[id number] DOB: [birth year] DOA: 09/11/2017 PCP: Marliss Coots, NP   Chief Complaint  Patient presents with  . Neck Pain  . Back Pain    Brief Narrative:  [name] is a 52 y.o. female with history of PTSD, depression, intravenous drug use, chronic back pain status post previous back surgery, ADD, bilateral ovarian cysts, PID, sciatica presented on 09/11/2017 with severe back and neck pain. MRI of the T and L-spine revealed ventral epidural abscess. She was found to have MSSA bacteremia. She was put on IV antibiotics. ID and neurosurgery were consulted. Patient was transferred to [facility]. She developed urinary retention on 09/15/2017 for which she required emergent C3-C7 laminectomy for decompression of spinal cord and debridement of epidural abscess. She had TEE on 09/20/2017 which was negative for vegetations. ID recommends at least 6 weeks of IV antibiotics and last negative Blood Cx were 09/19/17. Will need to continue IV antibiotics through 11/13/2016  Assessment & Plan   MSSA bacteremia -TEE negative for endocarditis on 09/20/17 -blood cultures show no growth from 09/19/17 -Previous hospitalist discussed with infectious disease on 10/21/2017, plan for Ancef and rifampin for 8 weeks total -requires hospital stay secondary to history of IV drug abuse  Spinal epidural abscess extending from C2-T8 -No neurological deficits, status post emergent C3-C7 laminectomy for decompression of the spinal cord, debridement of epidural abscess on 11/21 -Continue IV cefazolin, rifampin per ID recommendations- likely to continue through 11/13/2017  Back pain -noted on the right side -Feels pain is better -Continue k-pad and voltaren gel  History of polysubstance abuse, IV drug use -Currently stable, cannot be discharged with IV access or PICC line, hence will remain inpatient while on IV antibiotics  Chronic pain  syndrome -Currently stable, controlled, continue duloxetine, Robaxin, oxycodone, OxyContin with bowel regimen  Obesity, Class III, BMI 40-49.9 (morbid obesity) ([place]) -Counseled on diet and weight control -nutrition consulted  Reactive thrombocytosis -Last seen by Dr. Jana Hakim on 11/18 to rule out lymphoma.   Hepatitis C -Outpatient follow-up with ID/GI once IV antibiotics is complete  Normocytic iron deficiency anemia -Hemoglobin stable and baseline- currently at 12.3, outpatient follow-up with Dr. Jana Hakim  Hyponatremia -Resolved, will check BMP periodically  Elevated alkaline phosphatase -No abdominal pain. Resolving. Unknown etiology.  DVT Prophylaxis  heparin  Code Status: Full  Family Communication: None at bedside  Disposition Plan: Admitted, pending completion of IV antibiotics- 11/13/2017  Consultants Infectious disease Hematology Neurosurgery Cardiology  Procedures  Emergent C3-C7 laminectomy for decompression of spinal cord and debridement of epidural abscesson 09/15/2017  TEE on 09/20/2017: No evidence of vegetations  Transthoracic ECHO on 09/12/2017  Antibiotics   Anti-infectives (From admission, onward)   Start     Dose/Rate Route Frequency Ordered Stop   09/27/17 1800  ceFAZolin (ANCEF) IVPB 2g/100 mL premix     2 g 200 mL/hr over 30 Minutes Intravenous Every 8 hours 09/27/17 1622 11/13/17 2359   09/16/17 2300  nafcillin 2 g in dextrose 5 % 100 mL IVPB  Status:  Discontinued     2 g 200 mL/hr over 30 Minutes Intravenous Every 4 hours 09/15/17 2035 09/15/17 2303   09/16/17 1400  rifampin (RIFADIN) capsule 300 mg     300 mg Oral Every 12 hours 09/16/17 1344     09/15/17 2315  nafcillin 2 g in dextrose 5 % 100 mL IVPB  Status:  Discontinued     2 g 200 mL/hr over 30  Minutes Intravenous Every 4 hours 09/15/17 2303 09/27/17 1622   09/15/17 1237  bacitracin 50,000 Units in sodium chloride irrigation 0.9 % 500 mL irrigation  Status:   Discontinued       As needed 09/15/17 1237 09/15/17 1433   09/13/17 1200  nafcillin 2 g in dextrose 5 % 100 mL IVPB  Status:  Discontinued     2 g 200 mL/hr over 30 Minutes Intravenous Every 4 hours 09/13/17 0918 09/15/17 2035   09/12/17 1400  ceFAZolin (ANCEF) IVPB 2g/100 mL premix  Status:  Discontinued     2 g 200 mL/hr over 30 Minutes Intravenous Every 8 hours 09/12/17 0848 09/13/17 0918   09/12/17 1000  vancomycin (VANCOCIN) 1,250 mg in sodium chloride 0.9 % 250 mL IVPB  Status:  Discontinued     1,250 mg 166.7 mL/hr over 90 Minutes Intravenous Every 12 hours 09/11/17 1904 09/12/17 0828   09/12/17 0600  ceFEPIme (MAXIPIME) 1 g in dextrose 5 % 50 mL IVPB  Status:  Discontinued     1 g 100 mL/hr over 30 Minutes Intravenous Every 8 hours 09/11/17 1904 09/12/17 0827   09/11/17 1830  vancomycin (VANCOCIN) 2,000 mg in sodium chloride 0.9 % 500 mL IVPB     2,000 mg 250 mL/hr over 120 Minutes Intravenous  Once 09/11/17 1758 09/12/17 0026   09/11/17 1815  ceFEPIme (MAXIPIME) 2 g in dextrose 5 % 50 mL IVPB     2 g 100 mL/hr over 30 Minutes Intravenous  Once 09/11/17 1809 09/11/17 2208   09/11/17 1800  piperacillin-tazobactam (ZOSYN) IVPB 3.375 g  Status:  Discontinued     3.375 g 12.5 mL/hr over 240 Minutes Intravenous Every 8 hours 09/11/17 1757 09/11/17 1808      Subjective:   [name] seen and examined today.  Continues to have some back pain but better. Denies  chest pain, short of breath, abdominal pain, nausea vomiting, diarrhea or constipation, dizziness, headache.  Objective:   Vitals:   10/25/17 1533 10/25/17 2015 10/25/17 2311 10/26/17 0334  BP: 116/84 124/81 115/70 111/79  Pulse: 66 70 73 63  Resp: 16 17  18   Temp: 98.4 F (36.9 C) 98.1 F (36.7 C) 97.8 F (36.6 C) 98.3 F (36.8 C)  TempSrc: Oral Oral Oral Oral  SpO2: 100% 99% 99% 100%  Weight:      Height:        Intake/Output Summary (Last 24 hours) at 10/26/2017 0758 Last data filed at 10/26/2017  7026 Gross per 24 hour  Intake 980 ml  Output 2 ml  Net 978 ml   Filed Weights   09/13/17 0730 09/14/17 0831 09/21/17 0400  Weight: 117.4 kg (258 lb 13.1 oz) 115.8 kg (255 lb 4.7 oz) 111.1 kg (244 lb 14.9 oz)   Exam  General: Well developed, well nourished, NAD, appears stated age  58: NCAT, mucous membranes moist.   Cardiovascular: S1 S2 auscultated, RRR, no murmurs  Respiratory: Clear to auscultation bilaterally with equal chest rise  Abdomen: Soft, nontender, nondistended, + bowel sounds  Extremities: warm dry without cyanosis clubbing or edema  Neuro: AAOx3, nonfocal  Psych: Normal affect and demeanor with intact judgement and insight  Data Reviewed: I have personally reviewed following labs and imaging studies  CBC: Recent Labs  Lab 10/21/17 0500 10/24/17 0416  WBC 7.0 7.6  HGB 12.2 12.3  HCT 36.3 37.3  MCV 91.9 93.5  PLT 426* 378*   Basic Metabolic Panel: Recent Labs  Lab 10/21/17 0500  NA 135  K 4.0  CL 99*  CO2 29  GLUCOSE 91  BUN 8  CREATININE 0.60  CALCIUM 9.3   GFR: Estimated Creatinine Clearance: 107.4 mL/min (by C-G formula based on SCr of 0.6 mg/dL). Liver Function Tests: Recent Labs  Lab 10/21/17 0500  AST 34  ALT 22  ALKPHOS 129*  BILITOT 0.8  PROT 8.4*  ALBUMIN 3.1*   No results for input(s): LIPASE, AMYLASE in the last 168 hours. No results for input(s): AMMONIA in the last 168 hours. Coagulation Profile: No results for input(s): INR, PROTIME in the last 168 hours. Cardiac Enzymes: No results for input(s): CKTOTAL, CKMB, CKMBINDEX, TROPONINI in the last 168 hours. BNP (last 3 results) No results for input(s): PROBNP in the last 8760 hours. HbA1C: No results for input(s): HGBA1C in the last 72 hours. CBG: No results for input(s): GLUCAP in the last 168 hours. Lipid Profile: No results for input(s): CHOL, HDL, LDLCALC, TRIG, CHOLHDL, LDLDIRECT in the last 72 hours. Thyroid Function Tests: No results for input(s):  TSH, T4TOTAL, FREET4, T3FREE, THYROIDAB in the last 72 hours. Anemia Panel: No results for input(s): VITAMINB12, FOLATE, FERRITIN, TIBC, IRON, RETICCTPCT in the last 72 hours. Urine analysis:    Component Value Date/Time   COLORURINE YELLOW 09/11/2017 0709   APPEARANCEUR HAZY (A) 09/11/2017 0709   LABSPEC 1.020 09/11/2017 0709   PHURINE 6.0 09/11/2017 0709   GLUCOSEU NEGATIVE 09/11/2017 0709   HGBUR SMALL (A) 09/11/2017 0709   BILIRUBINUR NEGATIVE 09/11/2017 0709   KETONESUR NEGATIVE 09/11/2017 0709   PROTEINUR NEGATIVE 09/11/2017 0709   UROBILINOGEN 0.2 06/27/2015 0225   NITRITE NEGATIVE 09/11/2017 0709   LEUKOCYTESUR LARGE (A) 09/11/2017 0709   Sepsis Labs: @LABRCNTIP (procalcitonin:4,lacticidven:4)  ) No results found for this or any previous visit (from the past 240 hour(s)).    Radiology Studies: No results found.   Scheduled Meds: . Chlorhexidine Gluconate Cloth  6 each Topical Q2200  . diclofenac sodium  2 g Topical QID  . DULoxetine  60 mg Oral Daily  . heparin injection (subcutaneous)  5,000 Units Subcutaneous Q8H  . oxyCODONE  10 mg Oral Q12H  . rifampin  300 mg Oral Q12H  . senna-docusate  1 tablet Oral BID  . sodium chloride flush  10-40 mL Intracatheter Q12H   Continuous Infusions: .  ceFAZolin (ANCEF) IV Stopped (10/26/17 5366)     LOS: 45 days   Time Spent in minutes   30 minutes  Martine Trageser D.O. on 10/26/2017 at 7:58 AM  Between 7am to 7pm - Pager - [phone]  After 7pm go to [email or web address] - password [email or web address]  And look for the night coverage person covering for me after hours  [facility] Office  [phone]

## 2017-10-27 LAB — BASIC METABOLIC PANEL
Anion gap: 7 (ref 5–15)
BUN: 12 mg/dL (ref 6–20)
CALCIUM: 9.2 mg/dL (ref 8.9–10.3)
CO2: 28 mmol/L (ref 22–32)
CREATININE: 0.6 mg/dL (ref 0.44–1.00)
Chloride: 99 mmol/L — ABNORMAL LOW (ref 101–111)
GFR calc Af Amer: >60 mL/min (ref >60)
GFR calc non Af Amer: >60 mL/min (ref >60)
GLUCOSE: 113 mg/dL — AB (ref 65–99)
Potassium: 3.8 mmol/L (ref 3.5–5.1)
Sodium: 134 mmol/L — ABNORMAL LOW (ref 135–145)

## 2017-10-27 MED ORDER — ENOXAPARIN SODIUM 40 MG/0.4ML ~~LOC~~ SOLN
40.0000 mg | SUBCUTANEOUS | Status: DC
  Administered 2017-10-27 – 2017-11-14 (×19): 40 mg via SUBCUTANEOUS
  Filled 2017-10-27 (×19): qty 0.4

## 2017-10-27 MED ORDER — DICLOFENAC SODIUM 1 % TD GEL
2.0000 g | Freq: Four times a day (QID) | TRANSDERMAL | Status: DC | PRN
  Administered 2017-10-27 – 2017-11-09 (×6): 2 g via TOPICAL
  Filled 2017-10-27: qty 100

## 2017-10-27 NOTE — Progress Notes (Signed)
   10/27/17 1500  Clinical Encounter Type  Visited With Patient  Visit Type Initial  Referral From Chaplain  Consult/Referral To Chaplain  Spiritual Encounters  Spiritual Needs Other (Comment)  Stress Factors  Patient Stress Factors Health changes  Chaplain spoke to the PT as she was walking the halls.  The PT has what is a couple of weeks left here by her own admission. PT has a positive outlook in the process of healing

## 2017-10-27 NOTE — Progress Notes (Signed)
PROGRESS NOTE    [name]  VFI:[id number] DOB: 25-Oct-1966 DOA: 09/11/2017 PCP: Marliss Coots, NP   Chief Complaint  Patient presents with  . Neck Pain  . Back Pain    Brief Narrative:  [name] is a 52 y.o. female with history of PTSD, depression, intravenous drug use, chronic back pain status post previous back surgery, ADD, bilateral ovarian cysts, PID, sciatica presented on 09/11/2017 with severe back and neck pain. MRI of the T and L-spine revealed ventral epidural abscess. She was found to have MSSA bacteremia. She was put on IV antibiotics. ID and neurosurgery were consulted. Patient was transferred to [facility]. She developed urinary retention on 09/15/2017 for which she required emergent C3-C7 laminectomy for decompression of spinal cord and debridement of epidural abscess. She had TEE on 09/20/2017 which was negative for vegetations. ID recommends at least 6 weeks of IV antibiotics and last negative Blood Cx were 09/19/17. Will need to continue IV antibiotics through 11/13/2016  Assessment & Plan   MSSA bacteremia -TEE negative for endocarditis on 09/20/17 -blood cultures show no growth from 09/19/17 -Previous hospitalist discussed with infectious disease on 10/21/2017, plan for Ancef and rifampin for 8 weeks total -requires hospital stay secondary to history of IV drug abuse  Spinal epidural abscess extending from C2-T8 -No neurological deficits, status post emergent C3-C7 laminectomy for decompression of the spinal cord, debridement of epidural abscess on 11/21 -Continue IV cefazolin, rifampin per ID recommendations- likely to continue through 11/13/2017  Back pain -noted on the right side -has resolved -Continue k-pad and voltaren gel PRN  History of polysubstance abuse, IV drug use -Currently stable, cannot be discharged with IV access or PICC line, hence will remain inpatient while on IV antibiotics  Chronic pain  syndrome -Currently stable, controlled, continue duloxetine, Robaxin, oxycodone, OxyContin with bowel regimen  Obesity, Class III, BMI 40-49.9 (morbid obesity) ([place]) -Counseled on diet and weight control -nutrition consulted  Reactive thrombocytosis -Last seen by Dr. Jana Hakim on 11/18 to rule out lymphoma.   Hepatitis C -Outpatient follow-up with ID/GI once IV antibiotics is complete  Normocytic iron deficiency anemia -Hemoglobin stable and baseline- currently at 12.3, outpatient follow-up with Dr. Jana Hakim  Hyponatremia -Resolved, will check BMP periodically  Elevated alkaline phosphatase -No abdominal pain. Resolving. Unknown etiology.  DVT Prophylaxis  Heparin --> will switch to Lovenox  Code Status: Full  Family Communication: None at bedside  Disposition Plan: Admitted, pending completion of IV antibiotics- 11/13/2017  Consultants Infectious disease Hematology Neurosurgery Cardiology  Procedures  Emergent C3-C7 laminectomy for decompression of spinal cord and debridement of epidural abscesson 09/15/2017  TEE on 09/20/2017: No evidence of vegetations  Transthoracic ECHO on 09/12/2017  Antibiotics   Anti-infectives (From admission, onward)   Start     Dose/Rate Route Frequency Ordered Stop   09/27/17 1800  ceFAZolin (ANCEF) IVPB 2g/100 mL premix     2 g 200 mL/hr over 30 Minutes Intravenous Every 8 hours 09/27/17 1622 11/13/17 2359   09/16/17 2300  nafcillin 2 g in dextrose 5 % 100 mL IVPB  Status:  Discontinued     2 g 200 mL/hr over 30 Minutes Intravenous Every 4 hours 09/15/17 2035 09/15/17 2303   09/16/17 1400  rifampin (RIFADIN) capsule 300 mg     300 mg Oral Every 12 hours 09/16/17 1344     09/15/17 2315  nafcillin 2 g in dextrose 5 % 100 mL IVPB  Status:  Discontinued     2 g  200 mL/hr over 30 Minutes Intravenous Every 4 hours 09/15/17 2303 09/27/17 1622   09/15/17 1237  bacitracin 50,000 Units in sodium chloride irrigation 0.9 % 500 mL  irrigation  Status:  Discontinued       As needed 09/15/17 1237 09/15/17 1433   09/13/17 1200  nafcillin 2 g in dextrose 5 % 100 mL IVPB  Status:  Discontinued     2 g 200 mL/hr over 30 Minutes Intravenous Every 4 hours 09/13/17 0918 09/15/17 2035   09/12/17 1400  ceFAZolin (ANCEF) IVPB 2g/100 mL premix  Status:  Discontinued     2 g 200 mL/hr over 30 Minutes Intravenous Every 8 hours 09/12/17 0848 09/13/17 0918   09/12/17 1000  vancomycin (VANCOCIN) 1,250 mg in sodium chloride 0.9 % 250 mL IVPB  Status:  Discontinued     1,250 mg 166.7 mL/hr over 90 Minutes Intravenous Every 12 hours 09/11/17 1904 09/12/17 0828   09/12/17 0600  ceFEPIme (MAXIPIME) 1 g in dextrose 5 % 50 mL IVPB  Status:  Discontinued     1 g 100 mL/hr over 30 Minutes Intravenous Every 8 hours 09/11/17 1904 09/12/17 0827   09/11/17 1830  vancomycin (VANCOCIN) 2,000 mg in sodium chloride 0.9 % 500 mL IVPB     2,000 mg 250 mL/hr over 120 Minutes Intravenous  Once 09/11/17 1758 09/12/17 0026   09/11/17 1815  ceFEPIme (MAXIPIME) 2 g in dextrose 5 % 50 mL IVPB     2 g 100 mL/hr over 30 Minutes Intravenous  Once 09/11/17 1809 09/11/17 2208   09/11/17 1800  piperacillin-tazobactam (ZOSYN) IVPB 3.375 g  Status:  Discontinued     3.375 g 12.5 mL/hr over 240 Minutes Intravenous Every 8 hours 09/11/17 1757 09/11/17 1808      Subjective:   [name] seen and examined today.  Feels back pain has resolved. States she has been able to walk around more. Denies  chest pain, short of breath, abdominal pain, nausea vomiting, diarrhea or constipation, dizziness, headache.  Objective:   Vitals:   10/26/17 1625 10/26/17 1927 10/26/17 2334 10/27/17 0726  BP: 104/84 128/81 114/72 116/71  Pulse: 84 67 63   Resp: 18  16   Temp: 97.7 F (36.5 C) 98.2 F (36.8 C) 98.2 F (36.8 C) 98.2 F (36.8 C)  TempSrc: Oral Oral Oral Oral  SpO2: 100% 95% 96%   Weight:      Height:        Intake/Output Summary (Last 24 hours) at  10/27/2017 0742 Last data filed at 10/27/2017 7510 Gross per 24 hour  Intake 1260 ml  Output -  Net 1260 ml   Filed Weights   09/13/17 0730 09/14/17 0831 09/21/17 0400  Weight: 117.4 kg (258 lb 13.1 oz) 115.8 kg (255 lb 4.7 oz) 111.1 kg (244 lb 14.9 oz)   Exam  General: Well developed, well nourished, NAD, appears stated age  HEENT: NCAT, mucous membranes moist.   Cardiovascular: S1 S2 auscultated, RRR, no murmurs  Respiratory: Clear to auscultation bilaterally with equal chest rise, no wheezing or crackles  Abdomen: Soft, nontender, nondistended, + bowel sounds, abdominal bruising  Extremities: warm dry without cyanosis clubbing or edema  Neuro: AAOx3, nonfocal  Psych: Appropriate mood and affect  Data Reviewed: I have personally reviewed following labs and imaging studies  CBC: Recent Labs  Lab 10/21/17 0500 10/24/17 0416  WBC 7.0 7.6  HGB 12.2 12.3  HCT 36.3 37.3  MCV 91.9 93.5  PLT 426* 419*  Basic Metabolic Panel: Recent Labs  Lab 10/21/17 0500 10/27/17 0420  NA 135 134*  K 4.0 3.8  CL 99* 99*  CO2 29 28  GLUCOSE 91 113*  BUN 8 12  CREATININE 0.60 0.60  CALCIUM 9.3 9.2   GFR: Estimated Creatinine Clearance: 107.4 mL/min (by C-G formula based on SCr of 0.6 mg/dL). Liver Function Tests: Recent Labs  Lab 10/21/17 0500  AST 34  ALT 22  ALKPHOS 129*  BILITOT 0.8  PROT 8.4*  ALBUMIN 3.1*   No results for input(s): LIPASE, AMYLASE in the last 168 hours. No results for input(s): AMMONIA in the last 168 hours. Coagulation Profile: No results for input(s): INR, PROTIME in the last 168 hours. Cardiac Enzymes: No results for input(s): CKTOTAL, CKMB, CKMBINDEX, TROPONINI in the last 168 hours. BNP (last 3 results) No results for input(s): PROBNP in the last 8760 hours. HbA1C: No results for input(s): HGBA1C in the last 72 hours. CBG: No results for input(s): GLUCAP in the last 168 hours. Lipid Profile: No results for input(s): CHOL, HDL, LDLCALC,  TRIG, CHOLHDL, LDLDIRECT in the last 72 hours. Thyroid Function Tests: No results for input(s): TSH, T4TOTAL, FREET4, T3FREE, THYROIDAB in the last 72 hours. Anemia Panel: No results for input(s): VITAMINB12, FOLATE, FERRITIN, TIBC, IRON, RETICCTPCT in the last 72 hours. Urine analysis:    Component Value Date/Time   COLORURINE YELLOW 09/11/2017 0709   APPEARANCEUR HAZY (A) 09/11/2017 0709   LABSPEC 1.020 09/11/2017 0709   PHURINE 6.0 09/11/2017 0709   GLUCOSEU NEGATIVE 09/11/2017 0709   HGBUR SMALL (A) 09/11/2017 0709   BILIRUBINUR NEGATIVE 09/11/2017 0709   KETONESUR NEGATIVE 09/11/2017 0709   PROTEINUR NEGATIVE 09/11/2017 0709   UROBILINOGEN 0.2 06/27/2015 0225   NITRITE NEGATIVE 09/11/2017 0709   LEUKOCYTESUR LARGE (A) 09/11/2017 0709   Sepsis Labs: @LABRCNTIP (procalcitonin:4,lacticidven:4)  ) No results found for this or any previous visit (from the past 240 hour(s)).    Radiology Studies: No results found.   Scheduled Meds: . Chlorhexidine Gluconate Cloth  6 each Topical Q2200  . diclofenac sodium  2 g Topical QID  . DULoxetine  60 mg Oral Daily  . enoxaparin (LOVENOX) injection  40 mg Subcutaneous Q24H  . oxyCODONE  10 mg Oral Q12H  . rifampin  300 mg Oral Q12H  . senna-docusate  1 tablet Oral BID  . sodium chloride flush  10-40 mL Intracatheter Q12H   Continuous Infusions: .  ceFAZolin (ANCEF) IV Stopped (10/27/17 2330)     LOS: 46 days   Time Spent in minutes   30 minutes  Seng Fouts D.O. on 10/27/2017 at 7:42 AM  Between 7am to 7pm - Pager - [phone]  After 7pm go to [email or web address] - password [email or web address]  And look for the night coverage person covering for me after hours  [facility] Office  [phone]

## 2017-10-28 LAB — BASIC METABOLIC PANEL
ANION GAP: 7 (ref 5–15)
BUN: 13 mg/dL (ref 6–20)
CO2: 28 mmol/L (ref 22–32)
Calcium: 8.8 mg/dL — ABNORMAL LOW (ref 8.9–10.3)
Chloride: 99 mmol/L — ABNORMAL LOW (ref 101–111)
Creatinine, Ser: 0.52 mg/dL (ref 0.44–1.00)
Glucose, Bld: 92 mg/dL (ref 65–99)
Potassium: 3.9 mmol/L (ref 3.5–5.1)
SODIUM: 134 mmol/L — AB (ref 135–145)

## 2017-10-28 LAB — CBC
HCT: 35.4 % — ABNORMAL LOW (ref 36.0–46.0)
Hemoglobin: 11.5 g/dL — ABNORMAL LOW (ref 12.0–15.0)
MCH: 30.6 pg (ref 26.0–34.0)
MCHC: 32.5 g/dL (ref 30.0–36.0)
MCV: 94.1 fL (ref 78.0–100.0)
PLATELETS: 357 10*3/uL (ref 150–400)
RBC: 3.76 MIL/uL — ABNORMAL LOW (ref 3.87–5.11)
RDW: 13.8 % (ref 11.5–15.5)
WBC: 5.7 10*3/uL (ref 4.0–10.5)

## 2017-10-28 NOTE — Progress Notes (Signed)
[facility]                                                                              Patient Demographics  [name], is a 52 y.o. female, DOB - 05-Mar-1966, TGG:[id number]  Admit date - 09/11/2017   Admitting Physician Virginia Haff, MD  Outpatient Primary MD for the patient is [name], [name], NP  Outpatient specialists:   LOS - 47  days   Medical records reviewed and are as summarized below:    Chief Complaint  Patient presents with  . Neck Pain  . Back Pain       Brief summary   Virginia Cina Mattsonis a 52 y.o.female with history of PTSD, depression, intravenous drug use, chronic back pain status post previous back surgery, ADD, bilateral ovarian cysts, PID, sciatica presented on 09/11/2017 with severe back and neck pain. MRI of the T and L-spine revealed ventral epidural abscess. She was found to have MSSA bacteremia. She was put on IV antibiotics. ID and neurosurgery were consulted. Patient was transferred to [facility]. She developed urinary retention on 09/15/2017 for which she required emergent C3-C7 laminectomy for decompression of spinal cord and debridement of epidural abscess. She had TEE on 09/20/2017 which was negative for vegetations. ID recommends at least 6 weeks of IV antibiotics and last negative Blood Cx were 09/19/17. Will need to continue IV antibiotics through 11/13/2016   Assessment & Plan    MSSA bacteremia -TEE negative for endocarditis on 09/20/17.  Blood cultures negative from 09/19/17. 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 -No neurological deficits, ambulating in the room, status post emergent C3-C7 laminectomy for decompression of the spinal cord, debridement of epidural abscess on 11/21 -Continue IV cefazolin,  rifampin to continue through 11/13/17  Back pain/chronic pain syndrome Resolved, continue K pad, Voltaren gel as needed Continue duloxetine, Robaxin, oxycodone, OxyContin with bowel regimen  History of polysubstance abuse, IV drug use -Stable, will remain inpatient while on IV antibiotics, cannot be discharged with PICC line  Obesity, class III, BMI 40-49.9, morbid obesity -Counseled on diet and weight control, nutrition consulted reactive thrombocytosis  Reactive thrombocytosis -Follows Dr. Jana Kennedy, last follow-up on 11/18, improving  Hypo-natremia  Resolved   Normocytic iron deficiency anemia -Hemoglobin stable, outpatient follow-up with Dr. Jana Kennedy  Mildly Elevated alkaline phosphatase -No abdominal pain, resolving, unknown etiology  Code Status:  Full  DVT Prophylaxis:  Lovenox  Family Communication: Discussed in detail with the patient, all imaging results, lab results explained to the patient   Disposition Plan:   Time Spent in minutes  15 minutes  Procedures:  Emergent C3-C7 laminectomy for decompression of spinal cord and debridement of epidural abscesson 09/15/2017  TEE on 09/20/2017: No evidence of vegetations  Transthoracic ECHO on 09/12/2017   Consultants:   Infectious disease Hematology Neurosurgery Cardiology   Antimicrobials:   Cefazolin 12/03 >> stop on 1/19  Rifampin 11/22>>    Medications  Scheduled Meds: . Chlorhexidine  Gluconate Cloth  6 each Topical Q2200  . DULoxetine  60 mg Oral Daily  . enoxaparin (LOVENOX) injection  40 mg Subcutaneous Q24H  . oxyCODONE  10 mg Oral Q12H  . rifampin  300 mg Oral Q12H  . senna-docusate  1 tablet Oral BID  . sodium chloride flush  10-40 mL Intracatheter Q12H   Continuous Infusions: .  ceFAZolin (ANCEF) IV Stopped (10/28/17 6761)   PRN Meds:.acetaminophen **OR** acetaminophen, diclofenac sodium, hydrALAZINE, hydrOXYzine, menthol-cetylpyridinium **OR** phenol, methocarbamol, ondansetron  (ZOFRAN) IV **OR** ondansetron **OR** ondansetron, oxyCODONE, polyethylene glycol, sodium chloride flush   Antibiotics   Anti-infectives (From admission, onward)   Start     Dose/Rate Route Frequency Ordered Stop   09/27/17 1800  ceFAZolin (ANCEF) IVPB 2g/100 mL premix     2 g 200 mL/hr over 30 Minutes Intravenous Every 8 hours 09/27/17 1622 11/13/17 2359   09/16/17 2300  nafcillin 2 g in dextrose 5 % 100 mL IVPB  Status:  Discontinued     2 g 200 mL/hr over 30 Minutes Intravenous Every 4 hours 09/15/17 2035 09/15/17 2303   09/16/17 1400  rifampin (RIFADIN) capsule 300 mg     300 mg Oral Every 12 hours 09/16/17 1344     09/15/17 2315  nafcillin 2 g in dextrose 5 % 100 mL IVPB  Status:  Discontinued     2 g 200 mL/hr over 30 Minutes Intravenous Every 4 hours 09/15/17 2303 09/27/17 1622   09/15/17 1237  bacitracin 50,000 Units in sodium chloride irrigation 0.9 % 500 mL irrigation  Status:  Discontinued       As needed 09/15/17 1237 09/15/17 1433   09/13/17 1200  nafcillin 2 g in dextrose 5 % 100 mL IVPB  Status:  Discontinued     2 g 200 mL/hr over 30 Minutes Intravenous Every 4 hours 09/13/17 0918 09/15/17 2035   09/12/17 1400  ceFAZolin (ANCEF) IVPB 2g/100 mL premix  Status:  Discontinued     2 g 200 mL/hr over 30 Minutes Intravenous Every 8 hours 09/12/17 0848 09/13/17 0918   09/12/17 1000  vancomycin (VANCOCIN) 1,250 mg in sodium chloride 0.9 % 250 mL IVPB  Status:  Discontinued     1,250 mg 166.7 mL/hr over 90 Minutes Intravenous Every 12 hours 09/11/17 1904 09/12/17 0828   09/12/17 0600  ceFEPIme (MAXIPIME) 1 g in dextrose 5 % 50 mL IVPB  Status:  Discontinued     1 g 100 mL/hr over 30 Minutes Intravenous Every 8 hours 09/11/17 1904 09/12/17 0827   09/11/17 1830  vancomycin (VANCOCIN) 2,000 mg in sodium chloride 0.9 % 500 mL IVPB     2,000 mg 250 mL/hr over 120 Minutes Intravenous  Once 09/11/17 1758 09/12/17 0026   09/11/17 1815  ceFEPIme (MAXIPIME) 2 g in dextrose 5 % 50 mL  IVPB     2 g 100 mL/hr over 30 Minutes Intravenous  Once 09/11/17 1809 09/11/17 2208   09/11/17 1800  piperacillin-tazobactam (ZOSYN) IVPB 3.375 g  Status:  Discontinued     3.375 g 12.5 mL/hr over 240 Minutes Intravenous Every 8 hours 09/11/17 1757 09/11/17 1808        Subjective:   Arlin Savona was seen and examined today.  No complaints. Patient denies dizziness, chest pain, shortness of breath, abdominal pain, N/V/D/C, new weakness, numbess, tingling. No acute events overnight.    Objective:   Vitals:   10/27/17 2332 10/28/17 0356 10/28/17 0733 10/28/17 1125  BP: 136/76 108/65 109/76 115/70  Pulse: 65 63 64 66  Resp: 16 16 17 18   Temp: 97.9 F (36.6 C) 98 F (36.7 C) 97.8 F (36.6 C) 97.9 F (36.6 C)  TempSrc: Oral Oral Oral Oral  SpO2: 96% 96% 97% 98%  Weight:      Height:        Intake/Output Summary (Last 24 hours) at 10/28/2017 1242 Last data filed at 10/27/2017 1300 Gross per 24 hour  Intake 240 ml  Output -  Net 240 ml     Wt Readings from Last 3 Encounters:  09/21/17 111.1 kg (244 lb 14.9 oz)  09/11/17 112.5 kg (248 lb)  09/11/17 112.5 kg (248 lb)     Exam  General: Alert and oriented x 3, NAD  Eyes: ,  HEENT:    Cardiovascular: S1 S2 auscultated, no rubs, murmurs or gallops. Regular rate and rhythm.  Respiratory: Clear to auscultation bilaterally, no wheezing, rales or rhonchi  Gastrointestinal: Soft, nontender, nondistended, + bowel sounds  Ext: no pedal edema bilaterally  Neuro: no new deficits  Musculoskeletal: No digital cyanosis, clubbing  Skin: No rashes  Psych: Normal affect and demeanor, alert and oriented x3    Data Reviewed:  I have personally reviewed following labs and imaging studies  Micro Results No results found for this or any previous visit (from the past 240 hour(s)).  Radiology Reports Ct Angio Chest Pe W Or Wo Contrast  Result Date: 09/30/2017 CLINICAL DATA:  52 y/o F; shortness of breath, PE suspected,  high pretest probability. EXAM: CT ANGIOGRAPHY CHEST WITH CONTRAST TECHNIQUE: Multidetector CT imaging of the chest was performed using the standard protocol during bolus administration of intravenous contrast. Multiplanar CT image reconstructions and MIPs were obtained to evaluate the vascular anatomy. CONTRAST:  53 cc Isovue 370 COMPARISON:  09/11/2017 CT of the chest. 09/01/2017 thoracic spine MRI. FINDINGS: Cardiovascular: Satisfactory opacification of the pulmonary arteries to the segmental level. No evidence of pulmonary embolism. Normal heart size. No pericardial effusion. Mediastinum/Nodes: Prominent mediastinal lymph nodes are likely reactive. Normal thyroid gland. Normal thoracic esophagus. Right central venous catheter tip extends to the lower SVC. Lungs/Pleura: Small left pleural effusion. Upper Abdomen: No acute abnormality. Musculoskeletal: Paravertebral fluid collection predominantly on the right aspect of vertebral bodies and with a smaller component anterior and left lateral to the vertebral bodies extends from the T3-4 level to the T8 level and demonstrates a faint rim of peripheral enhancement (series 5, image 50 and series 8, image 77). There are faint lucent changes within the T7-8 endplates which may represent discitis osteomyelitis. Review of the MIP images confirms the above findings. IMPRESSION: 1. No pulmonary embolus identified. 2. Paravertebral rim enhancing collection extending from T3-4 to the T8 level, greater on the right, compatible with abscess, is decreased in its craniocaudal extent from prior thoracic MRI. 3. Faint lucency within T7-8 anterior endplates may represent developing osteomyelitis. No gross bony destructive changes. 4. Small left pleural effusion. Electronically Signed   By: Kristine Garbe M.D.   On: 09/30/2017 22:15    Lab Data:  CBC: Recent Labs  Lab 10/24/17 0416 10/28/17 0530  WBC 7.6 5.7  HGB 12.3 11.5*  HCT 37.3 35.4*  MCV 93.5 94.1  PLT  419* 193   Basic Metabolic Panel: Recent Labs  Lab 10/27/17 0420 10/28/17 0530  NA 134* 134*  K 3.8 3.9  CL 99* 99*  CO2 28 28  GLUCOSE 113* 92  BUN 12 13  CREATININE 0.60 0.52  CALCIUM 9.2 8.8*  GFR: Estimated Creatinine Clearance: 107.4 mL/min (by C-G formula based on SCr of 0.52 mg/dL). Liver Function Tests: No results for input(s): AST, ALT, ALKPHOS, BILITOT, PROT, ALBUMIN in the last 168 hours. No results for input(s): LIPASE, AMYLASE in the last 168 hours. No results for input(s): AMMONIA in the last 168 hours. Coagulation Profile: No results for input(s): INR, PROTIME in the last 168 hours. Cardiac Enzymes: No results for input(s): CKTOTAL, CKMB, CKMBINDEX, TROPONINI in the last 168 hours. BNP (last 3 results) No results for input(s): PROBNP in the last 8760 hours. HbA1C: No results for input(s): HGBA1C in the last 72 hours. CBG: No results for input(s): GLUCAP in the last 168 hours. Lipid Profile: No results for input(s): CHOL, HDL, LDLCALC, TRIG, CHOLHDL, LDLDIRECT in the last 72 hours. Thyroid Function Tests: No results for input(s): TSH, T4TOTAL, FREET4, T3FREE, THYROIDAB in the last 72 hours. Anemia Panel: No results for input(s): VITAMINB12, FOLATE, FERRITIN, TIBC, IRON, RETICCTPCT in the last 72 hours. Urine analysis:    Component Value Date/Time   COLORURINE YELLOW 09/11/2017 0709   APPEARANCEUR HAZY (A) 09/11/2017 0709   LABSPEC 1.020 09/11/2017 0709   PHURINE 6.0 09/11/2017 0709   GLUCOSEU NEGATIVE 09/11/2017 0709   HGBUR SMALL (A) 09/11/2017 0709   BILIRUBINUR NEGATIVE 09/11/2017 0709   KETONESUR NEGATIVE 09/11/2017 0709   PROTEINUR NEGATIVE 09/11/2017 0709   UROBILINOGEN 0.2 06/27/2015 0225   NITRITE NEGATIVE 09/11/2017 0709   LEUKOCYTESUR LARGE (A) 09/11/2017 0709     Ripudeep Rai M.D. [facility] 10/28/2017, 12:42 PM  Pager: [phone] Between 7am to 7pm - call Pager - [phone]  After 7pm go to [email or web address] - password  [email or web address]  Call night coverage person covering after 7pm

## 2017-10-29 NOTE — Progress Notes (Signed)
[facility]                                                                              Patient Demographics  [name], is a 52 y.o. female, DOB - Jul 29, 1966, VVO:[id number]  Admit date - 09/11/2017   Admitting Physician Bonnielee Haff, MD  Outpatient Primary MD for the patient is [name], [name], NP  Outpatient specialists:   LOS - 48  days   Medical records reviewed and are as summarized below:    Chief Complaint  Patient presents with  . Neck Pain  . Back Pain       Brief summary   Virginia Henthorn Mattsonis a 52 y.o.female with history of PTSD, depression, intravenous drug use, chronic back pain status post previous back surgery, ADD, bilateral ovarian cysts, PID, sciatica presented on 09/11/2017 with severe back and neck pain. MRI of the T and L-spine revealed ventral epidural abscess. She was found to have MSSA bacteremia. She was put on IV antibiotics. ID and neurosurgery were consulted. Patient was transferred to [facility]. She developed urinary retention on 09/15/2017 for which she required emergent C3-C7 laminectomy for decompression of spinal cord and debridement of epidural abscess. She had TEE on 09/20/2017 which was negative for vegetations. ID recommends at least 6 weeks of IV antibiotics and last negative Blood Cx were 09/19/17. Will need to continue IV antibiotics through 11/13/2016   Assessment & Plan    MSSA bacteremia -TEE negative for endocarditis on 09/20/17.  Blood cultures negative from 09/19/17. -Discussed with ID, Dr. 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 -No neurological deficits, ambulating in the room, status post emergent C3-C7 laminectomy for decompression of the  spinal cord, debridement of epidural abscess on 11/21 -Continue IV cefazolin, rifampin to continue through 11/13/17  Back pain/chronic pain syndrome -Improving, continue K pad, Voltaren gel as needed -Continue duloxetine, Robaxin, oxycodone, OxyContin with bowel regimen  History of polysubstance abuse, IV drug use -Stable, will remain inpatient while on IV antibiotics, cannot be discharged with PICC line  Obesity, class III, BMI 40-49.9, morbid obesity -Counseled on diet and weight control, nutrition consulted reactive thrombocytosis  Reactive thrombocytosis -Follows Dr. Jana Hakim, last follow-up on 11/18, improving  Hypo-natremia  Resolved   Normocytic iron deficiency anemia -Hemoglobin stable, outpatient follow-up with Dr. Jana Hakim  Mildly Elevated alkaline phosphatase -No abdominal pain, resolving, unknown etiology  Code Status:  Full  DVT Prophylaxis:  Lovenox  Family Communication: Discussed in detail with the patient, all imaging results, lab results explained to the patient   Disposition Plan:   Time Spent in minutes  15 minutes  Procedures:  Emergent C3-C7 laminectomy for decompression of spinal cord and debridement of epidural abscesson 09/15/2017  TEE on 09/20/2017: No evidence of vegetations  Transthoracic ECHO on 09/12/2017   Consultants:   Infectious disease Hematology Neurosurgery Cardiology   Antimicrobials:   Cefazolin 12/03 >> stop on 1/19  Rifampin 11/22>>  Medications  Scheduled Meds: . Chlorhexidine Gluconate Cloth  6 each Topical Q2200  . DULoxetine  60 mg Oral Daily  . enoxaparin (LOVENOX) injection  40 mg Subcutaneous Q24H  . oxyCODONE  10 mg Oral Q12H  . rifampin  300 mg Oral Q12H  . senna-docusate  1 tablet Oral BID  . sodium chloride flush  10-40 mL Intracatheter Q12H   Continuous Infusions: .  ceFAZolin (ANCEF) IV Stopped (10/29/17 0611)   PRN Meds:.acetaminophen **OR** acetaminophen, diclofenac sodium, hydrALAZINE,  hydrOXYzine, menthol-cetylpyridinium **OR** phenol, methocarbamol, ondansetron (ZOFRAN) IV **OR** ondansetron **OR** ondansetron, oxyCODONE, polyethylene glycol, sodium chloride flush   Antibiotics   Anti-infectives (From admission, onward)   Start     Dose/Rate Route Frequency Ordered Stop   09/27/17 1800  ceFAZolin (ANCEF) IVPB 2g/100 mL premix     2 g 200 mL/hr over 30 Minutes Intravenous Every 8 hours 09/27/17 1622 11/13/17 2359   09/16/17 2300  nafcillin 2 g in dextrose 5 % 100 mL IVPB  Status:  Discontinued     2 g 200 mL/hr over 30 Minutes Intravenous Every 4 hours 09/15/17 2035 09/15/17 2303   09/16/17 1400  rifampin (RIFADIN) capsule 300 mg     300 mg Oral Every 12 hours 09/16/17 1344     09/15/17 2315  nafcillin 2 g in dextrose 5 % 100 mL IVPB  Status:  Discontinued     2 g 200 mL/hr over 30 Minutes Intravenous Every 4 hours 09/15/17 2303 09/27/17 1622   09/15/17 1237  bacitracin 50,000 Units in sodium chloride irrigation 0.9 % 500 mL irrigation  Status:  Discontinued       As needed 09/15/17 1237 09/15/17 1433   09/13/17 1200  nafcillin 2 g in dextrose 5 % 100 mL IVPB  Status:  Discontinued     2 g 200 mL/hr over 30 Minutes Intravenous Every 4 hours 09/13/17 0918 09/15/17 2035   09/12/17 1400  ceFAZolin (ANCEF) IVPB 2g/100 mL premix  Status:  Discontinued     2 g 200 mL/hr over 30 Minutes Intravenous Every 8 hours 09/12/17 0848 09/13/17 0918   09/12/17 1000  vancomycin (VANCOCIN) 1,250 mg in sodium chloride 0.9 % 250 mL IVPB  Status:  Discontinued     1,250 mg 166.7 mL/hr over 90 Minutes Intravenous Every 12 hours 09/11/17 1904 09/12/17 0828   09/12/17 0600  ceFEPIme (MAXIPIME) 1 g in dextrose 5 % 50 mL IVPB  Status:  Discontinued     1 g 100 mL/hr over 30 Minutes Intravenous Every 8 hours 09/11/17 1904 09/12/17 0827   09/11/17 1830  vancomycin (VANCOCIN) 2,000 mg in sodium chloride 0.9 % 500 mL IVPB     2,000 mg 250 mL/hr over 120 Minutes Intravenous  Once 09/11/17 1758  09/12/17 0026   09/11/17 1815  ceFEPIme (MAXIPIME) 2 g in dextrose 5 % 50 mL IVPB     2 g 100 mL/hr over 30 Minutes Intravenous  Once 09/11/17 1809 09/11/17 2208   09/11/17 1800  piperacillin-tazobactam (ZOSYN) IVPB 3.375 g  Status:  Discontinued     3.375 g 12.5 mL/hr over 240 Minutes Intravenous Every 8 hours 09/11/17 1757 09/11/17 1808        Subjective:   Kiauna Zywicki was seen and examined today.  Watching TV, and bleeding, no complaints.  Getting bored.  Patient denies dizziness, chest pain, shortness of breath, abdominal pain, N/V/D/C, new weakness, numbess, tingling. No acute events overnight.    Objective:   Vitals:   10/29/17  0019 10/29/17 0340 10/29/17 0800 10/29/17 1238  BP: 130/82 123/79    Pulse: 63 62    Resp: 16 16    Temp: 98.2 F (36.8 C) 97.8 F (36.6 C) 97.9 F (36.6 C) 98.1 F (36.7 C)  TempSrc: Oral Oral Oral Oral  SpO2: 99% 96%    Weight:      Height:        Intake/Output Summary (Last 24 hours) at 10/29/2017 1335 Last data filed at 10/29/2017 0800 Gross per 24 hour  Intake 550 ml  Output -  Net 550 ml     Wt Readings from Last 3 Encounters:  09/21/17 111.1 kg (244 lb 14.9 oz)  09/11/17 112.5 kg (248 lb)  09/11/17 112.5 kg (248 lb)     Exam General: Alert and oriented x 3, NAD Eyes:  HEENT:   Cardiovascular: S1 S2 clear, RRR  No pedal edema b/l Respiratory: Clear to auscultation bilaterally, no wheezing, rales or rhonchi Gastrointestinal: Soft, nontender, nondistended, + bowel sounds Ext: no pedal edema bilaterally Neuro: no new deficit Musculoskeletal: No digital cyanosis, clubbing Skin: No rashes Psych: Normal affect and demeanor, alert and oriented x3      Data Reviewed:  I have personally reviewed following labs and imaging studies  Micro Results No results found for this or any previous visit (from the past 240 hour(s)).  Radiology Reports Ct Angio Chest Pe W Or Wo Contrast  Result Date: 09/30/2017 CLINICAL DATA:  52  y/o F; shortness of breath, PE suspected, high pretest probability. EXAM: CT ANGIOGRAPHY CHEST WITH CONTRAST TECHNIQUE: Multidetector CT imaging of the chest was performed using the standard protocol during bolus administration of intravenous contrast. Multiplanar CT image reconstructions and MIPs were obtained to evaluate the vascular anatomy. CONTRAST:  53 cc Isovue 370 COMPARISON:  09/11/2017 CT of the chest. 09/01/2017 thoracic spine MRI. FINDINGS: Cardiovascular: Satisfactory opacification of the pulmonary arteries to the segmental level. No evidence of pulmonary embolism. Normal heart size. No pericardial effusion. Mediastinum/Nodes: Prominent mediastinal lymph nodes are likely reactive. Normal thyroid gland. Normal thoracic esophagus. Right central venous catheter tip extends to the lower SVC. Lungs/Pleura: Small left pleural effusion. Upper Abdomen: No acute abnormality. Musculoskeletal: Paravertebral fluid collection predominantly on the right aspect of vertebral bodies and with a smaller component anterior and left lateral to the vertebral bodies extends from the T3-4 level to the T8 level and demonstrates a faint rim of peripheral enhancement (series 5, image 50 and series 8, image 77). There are faint lucent changes within the T7-8 endplates which may represent discitis osteomyelitis. Review of the MIP images confirms the above findings. IMPRESSION: 1. No pulmonary embolus identified. 2. Paravertebral rim enhancing collection extending from T3-4 to the T8 level, greater on the right, compatible with abscess, is decreased in its craniocaudal extent from prior thoracic MRI. 3. Faint lucency within T7-8 anterior endplates may represent developing osteomyelitis. No gross bony destructive changes. 4. Small left pleural effusion. Electronically Signed   By: Kristine Garbe M.D.   On: 09/30/2017 22:15    Lab Data:  CBC: Recent Labs  Lab 10/24/17 0416 10/28/17 0530  WBC 7.6 5.7  HGB 12.3  11.5*  HCT 37.3 35.4*  MCV 93.5 94.1  PLT 419* 505   Basic Metabolic Panel: Recent Labs  Lab 10/27/17 0420 10/28/17 0530  NA 134* 134*  K 3.8 3.9  CL 99* 99*  CO2 28 28  GLUCOSE 113* 92  BUN 12 13  CREATININE 0.60 0.52  CALCIUM 9.2 8.8*  GFR: Estimated Creatinine Clearance: 107.4 mL/min (by C-G formula based on SCr of 0.52 mg/dL). Liver Function Tests: No results for input(s): AST, ALT, ALKPHOS, BILITOT, PROT, ALBUMIN in the last 168 hours. No results for input(s): LIPASE, AMYLASE in the last 168 hours. No results for input(s): AMMONIA in the last 168 hours. Coagulation Profile: No results for input(s): INR, PROTIME in the last 168 hours. Cardiac Enzymes: No results for input(s): CKTOTAL, CKMB, CKMBINDEX, TROPONINI in the last 168 hours. BNP (last 3 results) No results for input(s): PROBNP in the last 8760 hours. HbA1C: No results for input(s): HGBA1C in the last 72 hours. CBG: No results for input(s): GLUCAP in the last 168 hours. Lipid Profile: No results for input(s): CHOL, HDL, LDLCALC, TRIG, CHOLHDL, LDLDIRECT in the last 72 hours. Thyroid Function Tests: No results for input(s): TSH, T4TOTAL, FREET4, T3FREE, THYROIDAB in the last 72 hours. Anemia Panel: No results for input(s): VITAMINB12, FOLATE, FERRITIN, TIBC, IRON, RETICCTPCT in the last 72 hours. Urine analysis:    Component Value Date/Time   COLORURINE YELLOW 09/11/2017 0709   APPEARANCEUR HAZY (A) 09/11/2017 0709   LABSPEC 1.020 09/11/2017 0709   PHURINE 6.0 09/11/2017 0709   GLUCOSEU NEGATIVE 09/11/2017 0709   HGBUR SMALL (A) 09/11/2017 0709   BILIRUBINUR NEGATIVE 09/11/2017 0709   KETONESUR NEGATIVE 09/11/2017 0709   PROTEINUR NEGATIVE 09/11/2017 0709   UROBILINOGEN 0.2 06/27/2015 0225   NITRITE NEGATIVE 09/11/2017 0709   LEUKOCYTESUR LARGE (A) 09/11/2017 0709     Kanaan Kagawa M.D. [facility] 10/29/2017, 1:35 PM  Pager: [phone] Between 7am to 7pm - call Pager -  [phone]  After 7pm go to [email or web address] - password [email or web address]  Call night coverage person covering after 7pm

## 2017-10-30 NOTE — Progress Notes (Signed)
[facility]                                                                              Patient Demographics  [name], is a 52 y.o. female, DOB - 1966/07/31, XNT:[id number]  Admit date - 09/11/2017   Admitting Physician Bonnielee Haff, MD  Outpatient Primary MD for the patient is [name], [name], NP  Outpatient specialists:   LOS - 49  days   Medical records reviewed and are as summarized below:    Chief Complaint  Patient presents with  . Neck Pain  . Back Pain       Brief summary   Virginia Ferrufino Mattsonis a 52 y.o.female with history of PTSD, depression, intravenous drug use, chronic back pain status post previous back surgery, ADD, bilateral ovarian cysts, PID, sciatica presented on 09/11/2017 with severe back and neck pain. MRI of the T and L-spine revealed ventral epidural abscess. She was found to have MSSA bacteremia. She was put on IV antibiotics. ID and neurosurgery were consulted. [facility]. She developed urinary retention on 09/15/2017 for which she required emergent C3-C7 laminectomy for decompression of spinal cord and debridement of epidural abscess. She had TEE on 09/20/2017 which was negative for vegetations. ID recommends at least 6 weeks of IV antibiotics and last negative Blood Cx were 09/19/17. Will need to continue IV antibiotics through 11/13/2016   Assessment & Plan    MSSA bacteremia -TEE negative for endocarditis on 09/20/17.  Blood cultures negative from 09/19/17. -Discussed with ID, Dr. 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 -No neurological deficits, ambulating in the room, status post emergent C3-C7 laminectomy for decompression of the spinal cord, debridement of epidural abscess on  11/21 -Continue IV cefazolin, rifampin to continue through 11/13/17  Back pain/chronic pain syndrome -Improving, continue K pad, Voltaren gel as needed -Continue duloxetine, Robaxin, oxycodone, OxyContin with bowel regimen  History of polysubstance abuse, IV drug use -Stable, will remain inpatient while on IV antibiotics, cannot be discharged with PICC line  Obesity, class III, BMI 40-49.9, morbid obesity -Counseled on diet and weight control, nutrition consulted reactive thrombocytosis  Reactive thrombocytosis -Follows Dr. Jana Hakim, last follow-up on 11/18, improving  Hypo-natremia  Resolved   Normocytic iron deficiency anemia -Hemoglobin stable, outpatient follow-up with Dr. Jana Hakim  Mildly Elevated alkaline phosphatase -No abdominal pain, resolving, unknown etiology  Code Status:  Full  DVT Prophylaxis:  Lovenox  Family Communication: Discussed in detail with the patient, all imaging results, lab results explained to the patient   Disposition Plan:   Time Spent in minutes 15 minutes  Procedures:  Emergent C3-C7 laminectomy for decompression of spinal cord and debridement of epidural abscesson 09/15/2017  TEE on 09/20/2017: No evidence of vegetations  Transthoracic ECHO on 09/12/2017   Consultants:   Infectious disease Hematology Neurosurgery Cardiology   Antimicrobials:   Cefazolin 12/03 >> stop on 1/19  Rifampin 11/22>>    Medications  Scheduled  Meds: . Chlorhexidine Gluconate Cloth  6 each Topical Q2200  . DULoxetine  60 mg Oral Daily  . enoxaparin (LOVENOX) injection  40 mg Subcutaneous Q24H  . oxyCODONE  10 mg Oral Q12H  . rifampin  300 mg Oral Q12H  . senna-docusate  1 tablet Oral BID  . sodium chloride flush  10-40 mL Intracatheter Q12H   Continuous Infusions: .  ceFAZolin (ANCEF) IV 2 g (10/30/17 1403)   PRN Meds:.acetaminophen **OR** acetaminophen, diclofenac sodium, hydrALAZINE, hydrOXYzine, menthol-cetylpyridinium **OR** phenol,  methocarbamol, ondansetron (ZOFRAN) IV **OR** ondansetron **OR** ondansetron, oxyCODONE, polyethylene glycol, sodium chloride flush   Antibiotics   Anti-infectives (From admission, onward)   Start     Dose/Rate Route Frequency Ordered Stop   09/27/17 1800  ceFAZolin (ANCEF) IVPB 2g/100 mL premix     2 g 200 mL/hr over 30 Minutes Intravenous Every 8 hours 09/27/17 1622 11/13/17 2359   09/16/17 2300  nafcillin 2 g in dextrose 5 % 100 mL IVPB  Status:  Discontinued     2 g 200 mL/hr over 30 Minutes Intravenous Every 4 hours 09/15/17 2035 09/15/17 2303   09/16/17 1400  rifampin (RIFADIN) capsule 300 mg     300 mg Oral Every 12 hours 09/16/17 1344     09/15/17 2315  nafcillin 2 g in dextrose 5 % 100 mL IVPB  Status:  Discontinued     2 g 200 mL/hr over 30 Minutes Intravenous Every 4 hours 09/15/17 2303 09/27/17 1622   09/15/17 1237  bacitracin 50,000 Units in sodium chloride irrigation 0.9 % 500 mL irrigation  Status:  Discontinued       As needed 09/15/17 1237 09/15/17 1433   09/13/17 1200  nafcillin 2 g in dextrose 5 % 100 mL IVPB  Status:  Discontinued     2 g 200 mL/hr over 30 Minutes Intravenous Every 4 hours 09/13/17 0918 09/15/17 2035   09/12/17 1400  ceFAZolin (ANCEF) IVPB 2g/100 mL premix  Status:  Discontinued     2 g 200 mL/hr over 30 Minutes Intravenous Every 8 hours 09/12/17 0848 09/13/17 0918   09/12/17 1000  vancomycin (VANCOCIN) 1,250 mg in sodium chloride 0.9 % 250 mL IVPB  Status:  Discontinued     1,250 mg 166.7 mL/hr over 90 Minutes Intravenous Every 12 hours 09/11/17 1904 09/12/17 0828   09/12/17 0600  ceFEPIme (MAXIPIME) 1 g in dextrose 5 % 50 mL IVPB  Status:  Discontinued     1 g 100 mL/hr over 30 Minutes Intravenous Every 8 hours 09/11/17 1904 09/12/17 0827   09/11/17 1830  vancomycin (VANCOCIN) 2,000 mg in sodium chloride 0.9 % 500 mL IVPB     2,000 mg 250 mL/hr over 120 Minutes Intravenous  Once 09/11/17 1758 09/12/17 0026   09/11/17 1815  ceFEPIme (MAXIPIME)  2 g in dextrose 5 % 50 mL IVPB     2 g 100 mL/hr over 30 Minutes Intravenous  Once 09/11/17 1809 09/11/17 2208   09/11/17 1800  piperacillin-tazobactam (ZOSYN) IVPB 3.375 g  Status:  Discontinued     3.375 g 12.5 mL/hr over 240 Minutes Intravenous Every 8 hours 09/11/17 1757 09/11/17 1808        Subjective:   Amelda Hapke was seen and examined today.  No complaints.  Patient denies dizziness, chest pain, shortness of breath, abdominal pain, N/V/D/C, new weakness, numbess, tingling. No acute events overnight.    Objective:   Vitals:   10/30/17 0316 10/30/17 0737 10/30/17 1110 10/30/17 1600  BP:  118/79 103/74 101/68 128/70  Pulse: 66     Resp: 18     Temp: 98.4 F (36.9 C) 97.7 F (36.5 C) 98.4 F (36.9 C) 98.5 F (36.9 C)  TempSrc: Oral Oral Oral Oral  SpO2: 100%     Weight:      Height:        Intake/Output Summary (Last 24 hours) at 10/30/2017 1654 Last data filed at 10/30/2017 1349 Gross per 24 hour  Intake 760 ml  Output -  Net 760 ml     Wt Readings from Last 3 Encounters:  09/21/17 111.1 kg (244 lb 14.9 oz)  09/11/17 112.5 kg (248 lb)  09/11/17 112.5 kg (248 lb)     Exam   General: Alert and oriented x 3, NAD Eyes:  HEENT:   Cardiovascular: S1-S2 clear, RRR, no pedal edema bilaterally Respiratory: CTA B Gastrointestinal: Soft, nontender, nondistended, + bowel sounds Ext: no pedal edema bilaterally Neuro: no neuro deficits Musculoskeletal: No digital cyanosis, clubbing Skin: No rashes Psych: Normal affect and demeanor, alert and oriented x3    Data Reviewed:  I have personally reviewed following labs and imaging studies  Micro Results No results found for this or any previous visit (from the past 240 hour(s)).  Radiology Reports Ct Angio Chest Pe W Or Wo Contrast  Result Date: 09/30/2017 CLINICAL DATA:  52 y/o F; shortness of breath, PE suspected, high pretest probability. EXAM: CT ANGIOGRAPHY CHEST WITH CONTRAST TECHNIQUE: Multidetector  CT imaging of the chest was performed using the standard protocol during bolus administration of intravenous contrast. Multiplanar CT image reconstructions and MIPs were obtained to evaluate the vascular anatomy. CONTRAST:  53 cc Isovue 370 COMPARISON:  09/11/2017 CT of the chest. 09/01/2017 thoracic spine MRI. FINDINGS: Cardiovascular: Satisfactory opacification of the pulmonary arteries to the segmental level. No evidence of pulmonary embolism. Normal heart size. No pericardial effusion. Mediastinum/Nodes: Prominent mediastinal lymph nodes are likely reactive. Normal thyroid gland. Normal thoracic esophagus. Right central venous catheter tip extends to the lower SVC. Lungs/Pleura: Small left pleural effusion. Upper Abdomen: No acute abnormality. Musculoskeletal: Paravertebral fluid collection predominantly on the right aspect of vertebral bodies and with a smaller component anterior and left lateral to the vertebral bodies extends from the T3-4 level to the T8 level and demonstrates a faint rim of peripheral enhancement (series 5, image 50 and series 8, image 77). There are faint lucent changes within the T7-8 endplates which may represent discitis osteomyelitis. Review of the MIP images confirms the above findings. IMPRESSION: 1. No pulmonary embolus identified. 2. Paravertebral rim enhancing collection extending from T3-4 to the T8 level, greater on the right, compatible with abscess, is decreased in its craniocaudal extent from prior thoracic MRI. 3. Faint lucency within T7-8 anterior endplates may represent developing osteomyelitis. No gross bony destructive changes. 4. Small left pleural effusion. Electronically Signed   By: Kristine Garbe M.D.   On: 09/30/2017 22:15    Lab Data:  CBC: Recent Labs  Lab 10/24/17 0416 10/28/17 0530  WBC 7.6 5.7  HGB 12.3 11.5*  HCT 37.3 35.4*  MCV 93.5 94.1  PLT 419* 270   Basic Metabolic Panel: Recent Labs  Lab 10/27/17 0420 10/28/17 0530  NA  134* 134*  K 3.8 3.9  CL 99* 99*  CO2 28 28  GLUCOSE 113* 92  BUN 12 13  CREATININE 0.60 0.52  CALCIUM 9.2 8.8*   GFR: Estimated Creatinine Clearance: 107.4 mL/min (by C-G formula based on SCr of 0.52 mg/dL). Liver  Function Tests: No results for input(s): AST, ALT, ALKPHOS, BILITOT, PROT, ALBUMIN in the last 168 hours. No results for input(s): LIPASE, AMYLASE in the last 168 hours. No results for input(s): AMMONIA in the last 168 hours. Coagulation Profile: No results for input(s): INR, PROTIME in the last 168 hours. Cardiac Enzymes: No results for input(s): CKTOTAL, CKMB, CKMBINDEX, TROPONINI in the last 168 hours. BNP (last 3 results) No results for input(s): PROBNP in the last 8760 hours. HbA1C: No results for input(s): HGBA1C in the last 72 hours. CBG: No results for input(s): GLUCAP in the last 168 hours. Lipid Profile: No results for input(s): CHOL, HDL, LDLCALC, TRIG, CHOLHDL, LDLDIRECT in the last 72 hours. Thyroid Function Tests: No results for input(s): TSH, T4TOTAL, FREET4, T3FREE, THYROIDAB in the last 72 hours. Anemia Panel: No results for input(s): VITAMINB12, FOLATE, FERRITIN, TIBC, IRON, RETICCTPCT in the last 72 hours. Urine analysis:    Component Value Date/Time   COLORURINE YELLOW 09/11/2017 0709   APPEARANCEUR HAZY (A) 09/11/2017 0709   LABSPEC 1.020 09/11/2017 0709   PHURINE 6.0 09/11/2017 0709   GLUCOSEU NEGATIVE 09/11/2017 0709   HGBUR SMALL (A) 09/11/2017 0709   BILIRUBINUR NEGATIVE 09/11/2017 0709   KETONESUR NEGATIVE 09/11/2017 0709   PROTEINUR NEGATIVE 09/11/2017 0709   UROBILINOGEN 0.2 06/27/2015 0225   NITRITE NEGATIVE 09/11/2017 0709   LEUKOCYTESUR LARGE (A) 09/11/2017 0709     Selena Swaminathan M.D. [facility] 10/30/2017, 4:54 PM  Pager: [phone] Between 7am to 7pm - call Pager - [phone]  After 7pm go to [email or web address] - password [email or web address]  Call night coverage person covering after 7pm

## 2017-10-31 NOTE — Progress Notes (Signed)
[facility]                                                                              Patient Demographics  [name], is a 52 y.o. female, DOB - [birth year], ATF:[id number]  Admit date - 09/11/2017   Admitting Physician Bonnielee Haff, MD  Outpatient Primary MD for the patient is [name], [name], NP  Outpatient specialists:   LOS - 50  days   Medical records reviewed and are as summarized below:    Chief Complaint  Patient presents with  . Neck Pain  . Back Pain       Brief summary   Shona Pardo Mattsonis a 52 y.o.female with history of PTSD, depression, intravenous drug use, chronic back pain status post previous back surgery, ADD, bilateral ovarian cysts, PID, sciatica presented on 09/11/2017 with severe back and neck pain. MRI of the T and L-spine revealed ventral epidural abscess. She was found to have MSSA bacteremia. She was put on IV antibiotics. ID and neurosurgery were consulted. Patient was transferred to [facility]. She developed urinary retention on 09/15/2017 for which she required emergent C3-C7 laminectomy for decompression of spinal cord and debridement of epidural abscess. She had TEE on 09/20/2017 which was negative for vegetations. ID recommends at least 6 weeks of IV antibiotics and last negative Blood Cx were 09/19/17. Will need to continue IV antibiotics through 11/13/2016   Assessment & Plan    MSSA bacteremia -TEE negative for endocarditis on 09/20/17.  Blood cultures negative from 09/19/17. -Discussed with ID, Dr. Graylon Good on 12/27, plan for Ancef, rifampin for 8 weeks total, to finish on 11/13/17 -Requires hospital stay for IV antibiotics due to history of IV drug abuse - no complaints  Spinal epidural abscess extending from C2-T8 -No neurological deficits, ambulating in the room, status post emergent C3-C7 laminectomy for decompression of the spinal cord, debridement of epidural abscess on 11/21 -Continue  IV cefazolin, rifampin to continue through 11/13/17  Back pain/chronic pain syndrome -Improving, continue K pad, Voltaren gel as needed -Continue duloxetine, Robaxin, oxycodone, OxyContin with bowel regimen  History of polysubstance abuse, IV drug use -Stable, will remain inpatient while on IV antibiotics, cannot be discharged with PICC line  Obesity, class III, BMI 40-49.9, morbid obesity -Counseled on diet and weight control, nutrition consulted reactive thrombocytosis  Reactive thrombocytosis -Follows Dr. Jana Hakim, last follow-up on 11/18, improving  Hypo-natremia  Resolved, bmet in am    Normocytic iron deficiency anemia -Hemoglobin stable, outpatient follow-up with Dr. Jana Hakim, follow CBC in am   Mildly Elevated alkaline phosphatase -No abdominal pain, resolving, unknown etiology  Code Status:  Full  DVT Prophylaxis:  Lovenox  Family Communication: Discussed in detail with the patient, all imaging results, lab results explained to the patient   Disposition Plan:   Time Spent in minutes 8mins   Procedures:  Emergent C3-C7 laminectomy for decompression of spinal cord and debridement of epidural abscesson 09/15/2017  TEE on 09/20/2017: No evidence of vegetations  Transthoracic ECHO on 09/12/2017   Consultants:   Infectious disease Hematology Neurosurgery Cardiology   Antimicrobials:   Cefazolin 12/03 >> stop on 1/19  Rifampin 11/22>>    Medications  Scheduled Meds: . Chlorhexidine Gluconate Cloth  6 each Topical Q2200  . DULoxetine  60 mg Oral Daily  . enoxaparin (LOVENOX) injection  40 mg Subcutaneous Q24H  . oxyCODONE  10 mg Oral Q12H  . rifampin  300 mg Oral Q12H  . senna-docusate  1 tablet Oral BID  . sodium chloride flush  10-40 mL Intracatheter Q12H   Continuous Infusions: .  ceFAZolin (ANCEF) IV Stopped (10/31/17 0718)   PRN Meds:.acetaminophen **OR** acetaminophen, diclofenac sodium, hydrALAZINE, hydrOXYzine, menthol-cetylpyridinium  **OR** phenol, methocarbamol, ondansetron (ZOFRAN) IV **OR** ondansetron **OR** ondansetron, oxyCODONE, polyethylene glycol, sodium chloride flush   Antibiotics   Anti-infectives (From admission, onward)   Start     Dose/Rate Route Frequency Ordered Stop   09/27/17 1800  ceFAZolin (ANCEF) IVPB 2g/100 mL premix     2 g 200 mL/hr over 30 Minutes Intravenous Every 8 hours 09/27/17 1622 11/13/17 2359   09/16/17 2300  nafcillin 2 g in dextrose 5 % 100 mL IVPB  Status:  Discontinued     2 g 200 mL/hr over 30 Minutes Intravenous Every 4 hours 09/15/17 2035 09/15/17 2303   09/16/17 1400  rifampin (RIFADIN) capsule 300 mg     300 mg Oral Every 12 hours 09/16/17 1344     09/15/17 2315  nafcillin 2 g in dextrose 5 % 100 mL IVPB  Status:  Discontinued     2 g 200 mL/hr over 30 Minutes Intravenous Every 4 hours 09/15/17 2303 09/27/17 1622   09/15/17 1237  bacitracin 50,000 Units in sodium chloride irrigation 0.9 % 500 mL irrigation  Status:  Discontinued       As needed 09/15/17 1237 09/15/17 1433   09/13/17 1200  nafcillin 2 g in dextrose 5 % 100 mL IVPB  Status:  Discontinued     2 g 200 mL/hr over 30 Minutes Intravenous Every 4 hours 09/13/17 0918 09/15/17 2035   09/12/17 1400  ceFAZolin (ANCEF) IVPB 2g/100 mL premix  Status:  Discontinued     2 g 200 mL/hr over 30 Minutes Intravenous Every 8 hours 09/12/17 0848 09/13/17 0918   09/12/17 1000  vancomycin (VANCOCIN) 1,250 mg in sodium chloride 0.9 % 250 mL IVPB  Status:  Discontinued     1,250 mg 166.7 mL/hr over 90 Minutes Intravenous Every 12 hours 09/11/17 1904 09/12/17 0828   09/12/17 0600  ceFEPIme (MAXIPIME) 1 g in dextrose 5 % 50 mL IVPB  Status:  Discontinued     1 g 100 mL/hr over 30 Minutes Intravenous Every 8 hours 09/11/17 1904 09/12/17 0827   09/11/17 1830  vancomycin (VANCOCIN) 2,000 mg in sodium chloride 0.9 % 500 mL IVPB     2,000 mg 250 mL/hr over 120 Minutes Intravenous  Once 09/11/17 1758 09/12/17 0026   09/11/17 1815   ceFEPIme (MAXIPIME) 2 g in dextrose 5 % 50 mL IVPB     2 g 100 mL/hr over 30 Minutes Intravenous  Once 09/11/17 1809 09/11/17 2208   09/11/17 1800  piperacillin-tazobactam (ZOSYN) IVPB 3.375 g  Status:  Discontinued     3.375 g 12.5 mL/hr over 240 Minutes Intravenous Every 8 hours 09/11/17 1757 09/11/17 1808        Subjective:   Kila Godina was seen and examined today.  No complaints, ambulating without any difficulty. Patient denies dizziness, chest pain, shortness of breath, abdominal pain, N/V/D/C, new weakness, numbess, tingling. No acute events overnight.    Objective:   Vitals:  10/30/17 1600 10/30/17 2001 10/30/17 2300 10/31/17 0300  BP: 128/70 114/70 124/80 108/84  Pulse:  70  65  Resp:  18 18   Temp: 98.5 F (36.9 C) 98.3 F (36.8 C) 98.8 F (37.1 C) 97.6 F (36.4 C)  TempSrc: Oral Oral Oral Oral  SpO2:  100%  100%  Weight:      Height:        Intake/Output Summary (Last 24 hours) at 10/31/2017 1324 Last data filed at 10/31/2017 0800 Gross per 24 hour  Intake 800 ml  Output -  Net 800 ml     Wt Readings from Last 3 Encounters:  09/21/17 111.1 kg (244 lb 14.9 oz)  09/11/17 112.5 kg (248 lb)  09/11/17 112.5 kg (248 lb)     Exam   General: Alert and oriented x 3, NAD  Eyes:  HEENT:    Cardiovascular: S1 S2 clear, RRR no pedal edema b/l  Respiratory: CTA B  Gastrointestinal: Soft, nontender, nondistended, + bowel sounds  Ext: no pedal edema bilaterally  Neuro: no new deficits  Musculoskeletal: No digital cyanosis, clubbing  Skin: No rashes  Psych: Normal affect and demeanor, alert and oriented x3     Data Reviewed:  I have personally reviewed following labs and imaging studies  Micro Results No results found for this or any previous visit (from the past 240 hour(s)).  Radiology Reports No results found.  Lab Data:  CBC: Recent Labs  Lab 10/28/17 0530  WBC 5.7  HGB 11.5*  HCT 35.4*  MCV 94.1  PLT 616   Basic  Metabolic Panel: Recent Labs  Lab 10/27/17 0420 10/28/17 0530  NA 134* 134*  K 3.8 3.9  CL 99* 99*  CO2 28 28  GLUCOSE 113* 92  BUN 12 13  CREATININE 0.60 0.52  CALCIUM 9.2 8.8*   GFR: Estimated Creatinine Clearance: 107.4 mL/min (by C-G formula based on SCr of 0.52 mg/dL). Liver Function Tests: No results for input(s): AST, ALT, ALKPHOS, BILITOT, PROT, ALBUMIN in the last 168 hours. No results for input(s): LIPASE, AMYLASE in the last 168 hours. No results for input(s): AMMONIA in the last 168 hours. Coagulation Profile: No results for input(s): INR, PROTIME in the last 168 hours. Cardiac Enzymes: No results for input(s): CKTOTAL, CKMB, CKMBINDEX, TROPONINI in the last 168 hours. BNP (last 3 results) No results for input(s): PROBNP in the last 8760 hours. HbA1C: No results for input(s): HGBA1C in the last 72 hours. CBG: No results for input(s): GLUCAP in the last 168 hours. Lipid Profile: No results for input(s): CHOL, HDL, LDLCALC, TRIG, CHOLHDL, LDLDIRECT in the last 72 hours. Thyroid Function Tests: No results for input(s): TSH, T4TOTAL, FREET4, T3FREE, THYROIDAB in the last 72 hours. Anemia Panel: No results for input(s): VITAMINB12, FOLATE, FERRITIN, TIBC, IRON, RETICCTPCT in the last 72 hours. Urine analysis:    Component Value Date/Time   COLORURINE YELLOW 09/11/2017 0709   APPEARANCEUR HAZY (A) 09/11/2017 0709   LABSPEC 1.020 09/11/2017 0709   PHURINE 6.0 09/11/2017 0709   GLUCOSEU NEGATIVE 09/11/2017 0709   HGBUR SMALL (A) 09/11/2017 0709   BILIRUBINUR NEGATIVE 09/11/2017 0709   KETONESUR NEGATIVE 09/11/2017 0709   PROTEINUR NEGATIVE 09/11/2017 0709   UROBILINOGEN 0.2 06/27/2015 0225   NITRITE NEGATIVE 09/11/2017 0709   LEUKOCYTESUR LARGE (A) 09/11/2017 0709     Fantasha Daniele M.D. [facility] 10/31/2017, 1:24 PM  Pager: [phone] Between 7am to 7pm - call Pager - [phone]  After 7pm go to [email or web address] - password  [email or web address]  Call night coverage  person covering after 7pm

## 2017-11-01 LAB — BASIC METABOLIC PANEL
Anion gap: 5 (ref 5–15)
BUN: 14 mg/dL (ref 6–20)
CHLORIDE: 101 mmol/L (ref 101–111)
CO2: 29 mmol/L (ref 22–32)
Calcium: 9.1 mg/dL (ref 8.9–10.3)
Creatinine, Ser: 0.57 mg/dL (ref 0.44–1.00)
GFR calc Af Amer: >60 mL/min (ref >60)
GLUCOSE: 106 mg/dL — AB (ref 65–99)
POTASSIUM: 4.2 mmol/L (ref 3.5–5.1)
Sodium: 135 mmol/L (ref 135–145)

## 2017-11-01 LAB — CBC
HCT: 36 % (ref 36.0–46.0)
HEMOGLOBIN: 11.6 g/dL — AB (ref 12.0–15.0)
MCH: 29.7 pg (ref 26.0–34.0)
MCHC: 32.2 g/dL (ref 30.0–36.0)
MCV: 92.1 fL (ref 78.0–100.0)
Platelets: 325 10*3/uL (ref 150–400)
RBC: 3.91 MIL/uL (ref 3.87–5.11)
RDW: 13.2 % (ref 11.5–15.5)
WBC: 6.4 10*3/uL (ref 4.0–10.5)

## 2017-11-01 NOTE — Progress Notes (Signed)
[facility]                                                                              Patient Demographics  [name], is a 52 y.o. female, DOB - 1966-01-03, [id number]  Admit date - 09/11/2017   Admitting Physician Bonnielee Haff, MD  Outpatient Primary MD for the patient is [name], [name], NP  Outpatient specialists:   LOS - 51  days   Medical records reviewed and are as summarized below:    Chief Complaint  Patient presents with  . Neck Pain  . Back Pain       Brief summary   Virginia Heft Mattsonis a 52 y.o.female with history of PTSD, depression, intravenous drug use, chronic back pain status post previous back surgery, ADD, bilateral ovarian cysts, PID, sciatica presented on 09/11/2017 with severe back and neck pain. MRI of the T and L-spine revealed ventral epidural abscess. She was found to have MSSA bacteremia. She was put on IV antibiotics. ID and neurosurgery were consulted. Patient was transferred to [facility]. She developed urinary retention on 09/15/2017 for which she required emergent C3-C7 laminectomy for decompression of spinal cord and debridement of epidural abscess. She had TEE on 09/20/2017 which was negative for vegetations. ID recommends at least 6 weeks of IV antibiotics and last negative Blood Cx were 09/19/17. Will need to continue IV antibiotics through 11/13/2016   Assessment & Plan    MSSA bacteremia -TEE negative for endocarditis on 09/20/17.  Blood cultures negative from 09/19/17. -Discussed with ID, Dr. 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 -No neurological deficits, ambulating in the room, status post emergent C3-C7 laminectomy for decompression of the spinal cord, debridement of epidural abscess on 11/21 -Continue IV  cefazolin, rifampin to continue through 11/13/17  Back pain/chronic pain syndrome -Improving, continue K pad, Voltaren gel as needed -Continue duloxetine, Robaxin, oxycodone, OxyContin with bowel regimen  History of polysubstance abuse, IV drug use -Stable, will remain inpatient while on IV antibiotics, cannot be discharged with PICC line  Obesity, class III, BMI 40-49.9, morbid obesity -Counseled on diet and weight control, nutrition consulted reactive thrombocytosis  Reactive thrombocytosis -Follows Dr. Jana Hakim, last follow-up on 11/18, improving  Hypo-natremia  Improved  Normocytic iron deficiency anemia -Hemoglobin stable, outpatient follow-up with Dr. Jana Hakim -H&H is stable, hemoglobin 11.6 today  Mildly Elevated alkaline phosphatase -No abdominal pain, resolving, unknown etiology  Code Status:  Full  DVT Prophylaxis:  Lovenox  Family Communication: Discussed in detail with the patient, all imaging results, lab results explained to the patient   Disposition Plan:   Time Spent in minutes 30mins   Procedures:  Emergent C3-C7 laminectomy for decompression of spinal cord and debridement of epidural abscesson 09/15/2017  TEE on 09/20/2017: No evidence of vegetations  Transthoracic ECHO on 09/12/2017   Consultants:   Infectious disease Hematology Neurosurgery Cardiology   Antimicrobials:   Cefazolin 12/03 >> stop on 1/19  Rifampin 11/22>>  Medications  Scheduled Meds: . Chlorhexidine Gluconate Cloth  6 each Topical Q2200  . DULoxetine  60 mg Oral Daily  . enoxaparin (LOVENOX) injection  40 mg Subcutaneous Q24H  . oxyCODONE  10 mg Oral Q12H  . rifampin  300 mg Oral Q12H  . senna-docusate  1 tablet Oral BID  . sodium chloride flush  10-40 mL Intracatheter Q12H   Continuous Infusions: .  ceFAZolin (ANCEF) IV 2 g (11/01/17 0515)   PRN Meds:.acetaminophen **OR** acetaminophen, diclofenac sodium, hydrALAZINE, hydrOXYzine, menthol-cetylpyridinium  **OR** phenol, methocarbamol, ondansetron (ZOFRAN) IV **OR** ondansetron **OR** ondansetron, oxyCODONE, polyethylene glycol, sodium chloride flush   Antibiotics   Anti-infectives (From admission, onward)   Start     Dose/Rate Route Frequency Ordered Stop   09/27/17 1800  ceFAZolin (ANCEF) IVPB 2g/100 mL premix     2 g 200 mL/hr over 30 Minutes Intravenous Every 8 hours 09/27/17 1622 11/13/17 2359   09/16/17 2300  nafcillin 2 g in dextrose 5 % 100 mL IVPB  Status:  Discontinued     2 g 200 mL/hr over 30 Minutes Intravenous Every 4 hours 09/15/17 2035 09/15/17 2303   09/16/17 1400  rifampin (RIFADIN) capsule 300 mg     300 mg Oral Every 12 hours 09/16/17 1344     09/15/17 2315  nafcillin 2 g in dextrose 5 % 100 mL IVPB  Status:  Discontinued     2 g 200 mL/hr over 30 Minutes Intravenous Every 4 hours 09/15/17 2303 09/27/17 1622   09/15/17 1237  bacitracin 50,000 Units in sodium chloride irrigation 0.9 % 500 mL irrigation  Status:  Discontinued       As needed 09/15/17 1237 09/15/17 1433   09/13/17 1200  nafcillin 2 g in dextrose 5 % 100 mL IVPB  Status:  Discontinued     2 g 200 mL/hr over 30 Minutes Intravenous Every 4 hours 09/13/17 0918 09/15/17 2035   09/12/17 1400  ceFAZolin (ANCEF) IVPB 2g/100 mL premix  Status:  Discontinued     2 g 200 mL/hr over 30 Minutes Intravenous Every 8 hours 09/12/17 0848 09/13/17 0918   09/12/17 1000  vancomycin (VANCOCIN) 1,250 mg in sodium chloride 0.9 % 250 mL IVPB  Status:  Discontinued     1,250 mg 166.7 mL/hr over 90 Minutes Intravenous Every 12 hours 09/11/17 1904 09/12/17 0828   09/12/17 0600  ceFEPIme (MAXIPIME) 1 g in dextrose 5 % 50 mL IVPB  Status:  Discontinued     1 g 100 mL/hr over 30 Minutes Intravenous Every 8 hours 09/11/17 1904 09/12/17 0827   09/11/17 1830  vancomycin (VANCOCIN) 2,000 mg in sodium chloride 0.9 % 500 mL IVPB     2,000 mg 250 mL/hr over 120 Minutes Intravenous  Once 09/11/17 1758 09/12/17 0026   09/11/17 1815   ceFEPIme (MAXIPIME) 2 g in dextrose 5 % 50 mL IVPB     2 g 100 mL/hr over 30 Minutes Intravenous  Once 09/11/17 1809 09/11/17 2208   09/11/17 1800  piperacillin-tazobactam (ZOSYN) IVPB 3.375 g  Status:  Discontinued     3.375 g 12.5 mL/hr over 240 Minutes Intravenous Every 8 hours 09/11/17 1757 09/11/17 1808        Subjective:   [name] was seen and examined today.  No significant complaints.  Patient denies dizziness, chest pain, shortness of breath, abdominal pain, N/V/D/C, new weakness, numbess, tingling.  Issues with the PICC line last night.  Objective:   Vitals:   10/31/17 2011 10/31/17 2255  11/01/17 0408 11/01/17 0700  BP: 118/77 109/76 125/86 117/69  Pulse: 76 76 62 76  Resp: 16 16 17    Temp: 98.6 F (37 C) 97.9 F (36.6 C) (!) 97.5 F (36.4 C) 97.9 F (36.6 C)  TempSrc: Oral Oral Oral Tympanic  SpO2: 99% 98% 99% 99%  Weight:      Height:        Intake/Output Summary (Last 24 hours) at 11/01/2017 1223 Last data filed at 10/31/2017 1806 Gross per 24 hour  Intake 480 ml  Output 3 ml  Net 477 ml     Wt Readings from Last 3 Encounters:  09/21/17 111.1 kg (244 lb 14.9 oz)  09/11/17 112.5 kg (248 lb)  09/11/17 112.5 kg (248 lb)     Exam    General: Alert and oriented x 3, NAD  Eyes:   HEENT:    Cardiovascular: S1 S2 clear, RRR  No pedal edema b/l  Respiratory: Clear to auscultation bilaterally, no wheezing, rales or rhonchi  Gastrointestinal: Soft, nontender, nondistended, + bowel sounds  Ext: no pedal edema bilaterally  Neuro: no new deficit  Musculoskeletal: No digital cyanosis, clubbing  Skin: No rashes  Psych: Normal affect and demeanor, alert and oriented x3     Data Reviewed:  I have personally reviewed following labs and imaging studies  Micro Results No results found for this or any previous visit (from the past 240 hour(s)).  Radiology Reports No results found.  Lab Data:  CBC: Recent Labs  Lab 10/28/17 0530  11/01/17 0500  WBC 5.7 6.4  HGB 11.5* 11.6*  HCT 35.4* 36.0  MCV 94.1 92.1  PLT 357 616   Basic Metabolic Panel: Recent Labs  Lab 10/27/17 0420 10/28/17 0530 11/01/17 0500  NA 134* 134* 135  K 3.8 3.9 4.2  CL 99* 99* 101  CO2 28 28 29   GLUCOSE 113* 92 106*  BUN 12 13 14   CREATININE 0.60 0.52 0.57  CALCIUM 9.2 8.8* 9.1   GFR: Estimated Creatinine Clearance: 107.4 mL/min (by C-G formula based on SCr of 0.57 mg/dL). Liver Function Tests: No results for input(s): AST, ALT, ALKPHOS, BILITOT, PROT, ALBUMIN in the last 168 hours. No results for input(s): LIPASE, AMYLASE in the last 168 hours. No results for input(s): AMMONIA in the last 168 hours. Coagulation Profile: No results for input(s): INR, PROTIME in the last 168 hours. Cardiac Enzymes: No results for input(s): CKTOTAL, CKMB, CKMBINDEX, TROPONINI in the last 168 hours. BNP (last 3 results) No results for input(s): PROBNP in the last 8760 hours. HbA1C: No results for input(s): HGBA1C in the last 72 hours. CBG: No results for input(s): GLUCAP in the last 168 hours. Lipid Profile: No results for input(s): CHOL, HDL, LDLCALC, TRIG, CHOLHDL, LDLDIRECT in the last 72 hours. Thyroid Function Tests: No results for input(s): TSH, T4TOTAL, FREET4, T3FREE, THYROIDAB in the last 72 hours. Anemia Panel: No results for input(s): VITAMINB12, FOLATE, FERRITIN, TIBC, IRON, RETICCTPCT in the last 72 hours. Urine analysis:    Component Value Date/Time   COLORURINE YELLOW 09/11/2017 0709   APPEARANCEUR HAZY (A) 09/11/2017 0709   LABSPEC 1.020 09/11/2017 0709   PHURINE 6.0 09/11/2017 0709   GLUCOSEU NEGATIVE 09/11/2017 0709   HGBUR SMALL (A) 09/11/2017 0709   BILIRUBINUR NEGATIVE 09/11/2017 0709   KETONESUR NEGATIVE 09/11/2017 0709   PROTEINUR NEGATIVE 09/11/2017 0709   UROBILINOGEN 0.2 06/27/2015 0225   NITRITE NEGATIVE 09/11/2017 0709   LEUKOCYTESUR LARGE (A) 09/11/2017 0709     Ellamarie Naeve M.D.  [facility] 11/01/2017, 12:23 PM  Pager: [phone] Between 7am to 7pm - call Pager - [phone]  After 7pm go to [email or web address] - password [email or web address]  Call night coverage person covering after 7pm

## 2017-11-02 NOTE — Progress Notes (Signed)
[facility]                                                                              Patient Demographics  [name], is a 52 y.o. female, DOB - April 24, 1966, [id number]  Admit date - 09/11/2017   Admitting Physician Bonnielee Haff, MD  Outpatient Primary MD for the patient is [name], [name], NP  Outpatient specialists:   LOS - 52  days   Medical records reviewed and are as summarized below:    Chief Complaint  Patient presents with  . Neck Pain  . Back Pain       Brief summary   Virginia Cody Mattsonis a 52 y.o.female with history of PTSD, depression, intravenous drug use, chronic back pain status post previous back surgery, ADD, bilateral ovarian cysts, PID, sciatica presented on 09/11/2017 with severe back and neck pain. MRI of the T and L-spine revealed ventral epidural abscess. She was found to have MSSA bacteremia. She was put on IV antibiotics. ID and neurosurgery were consulted. Patient was transferred to [facility]. She developed urinary retention on 09/15/2017 for which she required emergent C3-C7 laminectomy for decompression of spinal cord and debridement of epidural abscess. She had TEE on 09/20/2017 which was negative for vegetations. ID recommends at least 6 weeks of IV antibiotics and last negative Blood Cx were 09/19/17. Will need to continue IV antibiotics through 11/13/2016   Assessment & Plan    MSSA bacteremia -TEE negative for endocarditis on 09/20/17.  Blood cultures negative from 09/19/17. -Discussed with ID, Dr. Graylon Good on 12/27, plan for Ancef, rifampin for 8 weeks total, to finish on 11/13/17 -Requires hospital stay for IV antibiotics due to history of IV drug abuse -Stable no complaints, ambulating in the hallway  Spinal epidural abscess extending from C2-T8 -No neurological deficits, ambulating in the room, status post emergent C3-C7 laminectomy for decompression of the spinal cord, debridement of  epidural abscess on 11/21 -Continue IV cefazolin, rifampin to continue through 11/13/17  Back pain/chronic pain syndrome -Improving, continue K pad, Voltaren gel as needed -Continue duloxetine, Robaxin, oxycodone, OxyContin with bowel regimen  History of polysubstance abuse, IV drug use -Stable, will remain inpatient while on IV antibiotics, cannot be discharged with PICC line  Obesity, class III, BMI 40-49.9, morbid obesity -Counseled on diet and weight control, nutrition consulted reactive thrombocytosis  Reactive thrombocytosis -Follows Dr. Jana Hakim, last follow-up on 11/18, improving  Hypo-natremia  Improved, 135 on 1/7  Normocytic iron deficiency anemia -Hemoglobin stable, outpatient follow-up with Dr. Jana Hakim -H&H is stable, hemoglobin 11.6 on 1/7  Mildly Elevated alkaline phosphatase -No abdominal pain, resolving, unknown etiology  Code Status:  Full  DVT Prophylaxis:  Lovenox  Family Communication: Discussed in detail with the patient, all imaging results, lab results explained to the patient   Disposition Plan:   Time Spent in minutes 67mins   Procedures:  Emergent C3-C7 laminectomy for decompression of spinal cord and debridement of epidural abscesson 09/15/2017  TEE on 09/20/2017: No evidence of vegetations  Transthoracic ECHO on 09/12/2017   Consultants:   Infectious disease Hematology Neurosurgery Cardiology   Antimicrobials:   Cefazolin 12/03 >>  stop on 1/19  Rifampin 11/22>>    Medications  Scheduled Meds: . Chlorhexidine Gluconate Cloth  6 each Topical Q2200  . DULoxetine  60 mg Oral Daily  . enoxaparin (LOVENOX) injection  40 mg Subcutaneous Q24H  . oxyCODONE  10 mg Oral Q12H  . rifampin  300 mg Oral Q12H  . senna-docusate  1 tablet Oral BID  . sodium chloride flush  10-40 mL Intracatheter Q12H   Continuous Infusions: .  ceFAZolin (ANCEF) IV 2 g (11/02/17 1333)   PRN Meds:.acetaminophen **OR** acetaminophen, diclofenac sodium,  hydrALAZINE, hydrOXYzine, menthol-cetylpyridinium **OR** phenol, methocarbamol, ondansetron (ZOFRAN) IV **OR** ondansetron **OR** ondansetron, oxyCODONE, polyethylene glycol, sodium chloride flush   Antibiotics   Anti-infectives (From admission, onward)   Start     Dose/Rate Route Frequency Ordered Stop   09/27/17 1800  ceFAZolin (ANCEF) IVPB 2g/100 mL premix     2 g 200 mL/hr over 30 Minutes Intravenous Every 8 hours 09/27/17 1622 11/13/17 2359   09/16/17 2300  nafcillin 2 g in dextrose 5 % 100 mL IVPB  Status:  Discontinued     2 g 200 mL/hr over 30 Minutes Intravenous Every 4 hours 09/15/17 2035 09/15/17 2303   09/16/17 1400  rifampin (RIFADIN) capsule 300 mg     300 mg Oral Every 12 hours 09/16/17 1344     09/15/17 2315  nafcillin 2 g in dextrose 5 % 100 mL IVPB  Status:  Discontinued     2 g 200 mL/hr over 30 Minutes Intravenous Every 4 hours 09/15/17 2303 09/27/17 1622   09/15/17 1237  bacitracin 50,000 Units in sodium chloride irrigation 0.9 % 500 mL irrigation  Status:  Discontinued       As needed 09/15/17 1237 09/15/17 1433   09/13/17 1200  nafcillin 2 g in dextrose 5 % 100 mL IVPB  Status:  Discontinued     2 g 200 mL/hr over 30 Minutes Intravenous Every 4 hours 09/13/17 0918 09/15/17 2035   09/12/17 1400  ceFAZolin (ANCEF) IVPB 2g/100 mL premix  Status:  Discontinued     2 g 200 mL/hr over 30 Minutes Intravenous Every 8 hours 09/12/17 0848 09/13/17 0918   09/12/17 1000  vancomycin (VANCOCIN) 1,250 mg in sodium chloride 0.9 % 250 mL IVPB  Status:  Discontinued     1,250 mg 166.7 mL/hr over 90 Minutes Intravenous Every 12 hours 09/11/17 1904 09/12/17 0828   09/12/17 0600  ceFEPIme (MAXIPIME) 1 g in dextrose 5 % 50 mL IVPB  Status:  Discontinued     1 g 100 mL/hr over 30 Minutes Intravenous Every 8 hours 09/11/17 1904 09/12/17 0827   09/11/17 1830  vancomycin (VANCOCIN) 2,000 mg in sodium chloride 0.9 % 500 mL IVPB     2,000 mg 250 mL/hr over 120 Minutes Intravenous  Once  09/11/17 1758 09/12/17 0026   09/11/17 1815  ceFEPIme (MAXIPIME) 2 g in dextrose 5 % 50 mL IVPB     2 g 100 mL/hr over 30 Minutes Intravenous  Once 09/11/17 1809 09/11/17 2208   09/11/17 1800  piperacillin-tazobactam (ZOSYN) IVPB 3.375 g  Status:  Discontinued     3.375 g 12.5 mL/hr over 240 Minutes Intravenous Every 8 hours 09/11/17 1757 09/11/17 1808        Subjective:   Aissata Wilmore was seen and examined today.  No complaints.  Sitting in the hallway without any difficulty.  Patient denies dizziness, chest pain, shortness of breath, abdominal pain, N/V/D/C, new weakness, numbess, tingling.  Afebrile  Objective:   Vitals:   11/02/17 0258 11/02/17 0300 11/02/17 0810 11/02/17 1215  BP: 118/79 118/79 131/79 121/82  Pulse: 64 65 72 70  Resp:   16 14  Temp: 98 F (36.7 C)  98 F (36.7 C) 98.4 F (36.9 C)  TempSrc: Oral  Oral Oral  SpO2: 99% 97% 99% 97%  Weight:      Height:        Intake/Output Summary (Last 24 hours) at 11/02/2017 1354 Last data filed at 11/02/2017 0908 Gross per 24 hour  Intake 1980 ml  Output 7 ml  Net 1973 ml     Wt Readings from Last 3 Encounters:  09/21/17 111.1 kg (244 lb 14.9 oz)  09/11/17 112.5 kg (248 lb)  09/11/17 112.5 kg (248 lb)     Exam   Physical Exam  General: Alert and oriented x 3, NAD  Eyes:   HEENT:    Cardiovascular: S1 S2 clear, RRR No pedal edema b/l  Respiratory: CTA B  Gastrointestinal: Soft, nontender, nondistended, + bowel sounds  Ext: no pedal edema bilaterally  Neuro: no new deficits  Musculoskeletal: No digital cyanosis, clubbing  Skin: No rashes  Psych: Normal affect and demeanor, alert and oriented x3     Data Reviewed:  I have personally reviewed following labs and imaging studies  Micro Results No results found for this or any previous visit (from the past 240 hour(s)).  Radiology Reports No results found.  Lab Data:  CBC: Recent Labs  Lab 10/28/17 0530 11/01/17 0500  WBC 5.7  6.4  HGB 11.5* 11.6*  HCT 35.4* 36.0  MCV 94.1 92.1  PLT 357 010   Basic Metabolic Panel: Recent Labs  Lab 10/27/17 0420 10/28/17 0530 11/01/17 0500  NA 134* 134* 135  K 3.8 3.9 4.2  CL 99* 99* 101  CO2 28 28 29   GLUCOSE 113* 92 106*  BUN 12 13 14   CREATININE 0.60 0.52 0.57  CALCIUM 9.2 8.8* 9.1   GFR: Estimated Creatinine Clearance: 107.4 mL/min (by C-G formula based on SCr of 0.57 mg/dL). Liver Function Tests: No results for input(s): AST, ALT, ALKPHOS, BILITOT, PROT, ALBUMIN in the last 168 hours. No results for input(s): LIPASE, AMYLASE in the last 168 hours. No results for input(s): AMMONIA in the last 168 hours. Coagulation Profile: No results for input(s): INR, PROTIME in the last 168 hours. Cardiac Enzymes: No results for input(s): CKTOTAL, CKMB, CKMBINDEX, TROPONINI in the last 168 hours. BNP (last 3 results) No results for input(s): PROBNP in the last 8760 hours. HbA1C: No results for input(s): HGBA1C in the last 72 hours. CBG: No results for input(s): GLUCAP in the last 168 hours. Lipid Profile: No results for input(s): CHOL, HDL, LDLCALC, TRIG, CHOLHDL, LDLDIRECT in the last 72 hours. Thyroid Function Tests: No results for input(s): TSH, T4TOTAL, FREET4, T3FREE, THYROIDAB in the last 72 hours. Anemia Panel: No results for input(s): VITAMINB12, FOLATE, FERRITIN, TIBC, IRON, RETICCTPCT in the last 72 hours. Urine analysis:    Component Value Date/Time   COLORURINE YELLOW 09/11/2017 0709   APPEARANCEUR HAZY (A) 09/11/2017 0709   LABSPEC 1.020 09/11/2017 0709   PHURINE 6.0 09/11/2017 0709   GLUCOSEU NEGATIVE 09/11/2017 0709   HGBUR SMALL (A) 09/11/2017 0709   BILIRUBINUR NEGATIVE 09/11/2017 0709   KETONESUR NEGATIVE 09/11/2017 0709   PROTEINUR NEGATIVE 09/11/2017 0709   UROBILINOGEN 0.2 06/27/2015 0225   NITRITE NEGATIVE 09/11/2017 0709   LEUKOCYTESUR LARGE (A) 09/11/2017 0709     Jovaun Levene M.D.  [facility] 11/02/2017, 1:54 PM  Pager:  [phone] Between 7am to 7pm - call Pager - [phone]  After 7pm go to [email or web address] - password [email or web address]  Call night coverage person covering after 7pm

## 2017-11-03 LAB — CBC
HEMATOCRIT: 36.4 % (ref 36.0–46.0)
HEMOGLOBIN: 12 g/dL (ref 12.0–15.0)
MCH: 30.4 pg (ref 26.0–34.0)
MCHC: 33 g/dL (ref 30.0–36.0)
MCV: 92.2 fL (ref 78.0–100.0)
Platelets: 319 10*3/uL (ref 150–400)
RBC: 3.95 MIL/uL (ref 3.87–5.11)
RDW: 13.2 % (ref 11.5–15.5)
WBC: 6.9 10*3/uL (ref 4.0–10.5)

## 2017-11-03 LAB — BASIC METABOLIC PANEL
ANION GAP: 7 (ref 5–15)
BUN: 13 mg/dL (ref 6–20)
CHLORIDE: 101 mmol/L (ref 101–111)
CO2: 29 mmol/L (ref 22–32)
Calcium: 9.3 mg/dL (ref 8.9–10.3)
Creatinine, Ser: 0.57 mg/dL (ref 0.44–1.00)
GFR calc non Af Amer: >60 mL/min (ref >60)
Glucose, Bld: 94 mg/dL (ref 65–99)
Potassium: 4.3 mmol/L (ref 3.5–5.1)
SODIUM: 137 mmol/L (ref 135–145)

## 2017-11-03 NOTE — Progress Notes (Signed)
PROGRESS NOTE    [name]  MVH:[id number] DOB: 10-15-1966 DOA: 09/11/2017 PCP: Marliss Coots, NP   Brief Narrative: [name] is a 52 y.o. female with history of PTSD, depression, intravenous drug use, chronic back pain status post previous back surgery, ADD, bilateral ovarian cysts, PID, sciatica presented on 09/11/2017 with severe back and neck pain. MRI of the T and L-spine revealed ventral epidural abscess. She was found to have MSSA bacteremia. She was put on IV antibiotics. ID and neurosurgery were consulted. Patient was transferred to [facility]. She developed urinary retention on 09/15/2017 for which she required emergent C3-C7 laminectomy for decompression of spinal cord and debridement of epidural abscess. She had TEE on 09/20/2017 which was negative for vegetations. ID recommends at least 6 weeks of IV antibiotics and last negative Blood Cx were 09/19/17.   Assessment & Plan:   Principal Problem:   Severe sepsis ([place]) Active Problems:   Hypokalemia   Lymphadenopathy   Back pain   Hyponatremia   Right ovarian cyst   Hypotension   MSSA bacteremia   Abscess in epidural space of thoracic spine   Mediastinal mass   Obesity, Class III, BMI 40-49.9 (morbid obesity) (HCC)   Leucocytosis   Neck pain   Abscess in epidural space of cervical spine   MSSA bacteremia -TEE negative for endocarditis 11/26 -Blood cultures negative from 11/25 -Clarified with ID on 12/27; plan is for Ancef and rifampin for 8 weeks total. Patient will get treatment in the hospital secondary to history of IV drug use.  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 Will need to wean as patient will not be given prescriptions on discharge and she does not have a pain management physician -Discontinue oxycontin -Continue oxycodone prn   Obesity, Class III, BMI 40-49.9 (morbid obesity) ([place]) -Counseled on diet and weight control.,  Nutrition consult  Reactive thrombocytosis -Last seen by Dr. Jana Hakim on 11/18 to rule out lymphoma. Resolved currently    Hepatitis C -Outpatient follow-up with ID/GI once IV antibiotics is complete  Normocytic iron deficiency anemia -H&H stable and baseline, outpatient follow-up with Dr. Jana Hakim -Follow CBC in a.m.  Hyponatremia -Resolved  Elevated alkaline phosphatase No abdominal pain. Resolved. Unknown etiology.   DVT prophylaxis: Heparin Code Status: Full code Family Communication: None at bedside Disposition Plan: Discharge home once IV antibiotics therapy completed   Consultants:   Infectious disease  Hematology  Neurosurgery  Cardiology  Procedures:  Emergent C3-C7 laminectomy for decompression of spinal cord and debridement of epidural abscesson 09/15/2017  TEE on 09/20/2017: No evidence of vegetations  Transthoracic ECHO on 09/12/2017 Study Conclusions  - Left ventricle: The cavity size was normal. Wall thickness was normal. Systolic function was normal. The estimated ejection fraction was in the range of 55% to 60%. Wall motion was normal; there were no regional wall motion abnormalities. Left ventricular diastolic function parameters were normal. - Mitral valve: There was mild regurgitation. - Pulmonary arteries: Systolic pressure was mildly increased. PA peak pressure: 39 mm Hg (S).     Antimicrobials:  Cefazolin  Rifampin  Nafcillin    Subjective: No concerns today  Objective: Vitals:   11/03/17 0004 11/03/17 0416 11/03/17 0745 11/03/17  1049  BP: (!) 105/54 130/84 97/72 130/75  Pulse: 70 61 68 70  Resp: 16 16 16 14   Temp: 98.1 F (36.7 C) 97.6 F (36.4 C) 98.3 F (36.8 C) 98.1 F (36.7 C)  TempSrc: Oral Oral Oral Oral  SpO2: 97% 100% 99% 99%  Weight:      Height:        Intake/Output Summary (Last 24 hours) at 11/03/2017 1503 Last data filed at 11/03/2017 0900 Gross per 24 hour  Intake 440 ml  Output -  Net 440 ml   Filed Weights   09/13/17 0730 09/14/17 0831 09/21/17 0400  Weight: 117.4 kg (258 lb 13.1 oz) 115.8 kg (255 lb 4.7 oz) 111.1 kg (244 lb 14.9 oz)    Examination:  General exam: Appears calm and comfortable Respiratory system: Clear to auscultation. Respiratory effort normal. Cardiovascular system: S1 & S2 heard, RRR. No murmurs, rubs, gallops or clicks. Gastrointestinal system: Abdomen is nondistended, soft and nontender. No organomegaly or masses felt. Normal bowel sounds heard. Central nervous system: Alert and oriented. No focal neurological deficits. Extremities: No edema. No calf tenderness, right ankle monitor Skin: No cyanosis. No rashes Psychiatry: Judgement and insight appear normal. Mood & affect appropriate.     Data Reviewed: I have personally reviewed following labs and imaging studies  CBC: Recent Labs  Lab 10/28/17 0530 11/01/17 0500 11/03/17 0500  WBC 5.7 6.4 6.9  HGB 11.5* 11.6* 12.0  HCT 35.4* 36.0 36.4  MCV 94.1 92.1 92.2  PLT 357 325 258   Basic Metabolic Panel: Recent Labs  Lab 10/28/17 0530 11/01/17 0500 11/03/17 0500  NA 134* 135 137  K 3.9 4.2 4.3  CL 99* 101 101  CO2 28 29 29   GLUCOSE 92 106* 94  BUN 13 14 13   CREATININE 0.52 0.57 0.57  CALCIUM 8.8* 9.1 9.3   GFR: Estimated Creatinine Clearance: 107.4 mL/min (by C-G formula based on SCr of 0.57 mg/dL). Liver Function Tests: No results for input(s): AST, ALT, ALKPHOS, BILITOT, PROT, ALBUMIN in the last 168 hours. No results for input(s): LIPASE, AMYLASE in the last 168 hours. No results for  input(s): AMMONIA in the last 168 hours. Coagulation Profile: No results for input(s): INR, PROTIME in the last 168 hours. Cardiac Enzymes: No results for input(s): CKTOTAL, CKMB, CKMBINDEX, TROPONINI in the last 168 hours. BNP (last 3 results) No results for input(s): PROBNP in the last 8760 hours. HbA1C: No results for input(s): HGBA1C in the last 72 hours. CBG: No results for input(s): GLUCAP in the last 168 hours. Lipid Profile: No results for input(s): CHOL, HDL, LDLCALC, TRIG, CHOLHDL, LDLDIRECT in the last 72 hours. Thyroid Function Tests: No results for input(s): TSH, T4TOTAL, FREET4, T3FREE, THYROIDAB in the last 72 hours. Anemia Panel: No results for input(s): VITAMINB12, FOLATE, FERRITIN, TIBC, IRON, RETICCTPCT in the last 72 hours. Sepsis Labs: No results for input(s): PROCALCITON, LATICACIDVEN in the last 168 hours.  No results found for this or any previous visit (from the past 240 hour(s)).       Radiology Studies: No results found.      Scheduled Meds: . Chlorhexidine Gluconate Cloth  6 each Topical Q2200  . DULoxetine  60 mg Oral Daily  . enoxaparin (LOVENOX) injection  40 mg Subcutaneous Q24H  . rifampin  300 mg Oral Q12H  . senna-docusate  1 tablet Oral BID  . sodium chloride flush  10-40 mL Intracatheter Q12H  Continuous Infusions: .  ceFAZolin (ANCEF) IV 2 g (11/03/17 1416)     LOS: 72 days     Cordelia Poche, MD [facility] 11/03/2017, 3:03 PM Pager: [phone]  If 7PM-7AM, please contact night-coverage [email or web address] Password [email or web address] 11/03/2017, 3:03 PM

## 2017-11-03 NOTE — Care Management Note (Signed)
Case Management Note  Patient Details  Name: [name] MRN: [id number] Date of Birth: 1966/03/13  Subjective/Objective:     52 year old female w/ a history of IV drug use, hep C and chronic back pain status post previous back surgery who presented to the ED with severe back and neck pain.  An MRI of the T and L-spine revealed a ventral epidural abscess C2-t8. 1/21 Pt with emergent C3-7 lami and epidural abscess debridement.  PTA, pt independent, lives in a boarding house.                Action/Plan: PT recommending HHPT, DME for home.  Pt plans to dc home with sister at dc. Pt uninsured; uncertain if pt will qualify for home therapies with current diagnosis as uninsured pt.  Will verify this.  Pt may need medication assistance at discharge.  Will follow progress.   Expected Discharge Date:                  Expected Discharge Plan:  Home/Self Care  In-House Referral:  Clinical Social Work  Discharge planning Services  CM Consult  Post Acute Care Choice:    Choice offered to:     DME Arranged:    DME Agency:     HH Arranged:    HH Agency:     Status of Service:  In process, will continue to follow  If discussed at Long Length of Stay Meetings, dates discussed:    Additional Comments:  09/28/17 J. Jamail Cullers, RN, BSN Pt requires total of 6 weeks IV antibiotic therapy for treatment of MSSA bacteremia and spinal abscess.  Pt with recent heroin use (4 months ago), and we will not dc home with PICC line.  CSW following to facilitate discharge to SNF for IV antibiotic therapy.  Disposition difficult due to no payor source and IVDU.  Will continue to follow/assist with discharge planning.    11/03/17 J. Clotilda Hafer, RN, BSN Pt continues on IV antibiotic treatment for MSSA bacteremia.  She was unable to be placed in SNF due to ankle monitoring bracelet.  IV antibiotics to continue through 11/13/17.  Pt ambulating independently without assistive device.  Will continue to follow.   Reinaldo Raddle, RN, BSN  Trauma/Neuro ICU Case Manager [phone]

## 2017-11-04 MED ORDER — OXYCODONE HCL 5 MG PO TABS
5.0000 mg | ORAL_TABLET | Freq: Four times a day (QID) | ORAL | Status: DC | PRN
  Administered 2017-11-05 – 2017-11-07 (×10): 5 mg via ORAL
  Filled 2017-11-04 (×10): qty 1

## 2017-11-04 NOTE — Progress Notes (Signed)
PROGRESS NOTE    [name]  OBS:[id number] DOB: 07/31/1966 DOA: 09/11/2017 PCP: Marliss Coots, NP   Brief Narrative: [name] is a 52 y.o. female with history of PTSD, depression, intravenous drug use, chronic back pain status post previous back surgery, ADD, bilateral ovarian cysts, PID, sciatica presented on 09/11/2017 with severe back and neck pain. MRI of the T and L-spine revealed ventral epidural abscess. She was found to have MSSA bacteremia. She was put on IV antibiotics. ID and neurosurgery were consulted. Patient was [facility] to [facility]. She developed urinary retention on 09/15/2017 for which she required emergent C3-C7 laminectomy for decompression of spinal cord and debridement of epidural abscess. She had TEE on 09/20/2017 which was negative for vegetations. ID recommends at least 6 weeks of IV antibiotics and last negative Blood Cx were 09/19/17.   Assessment & Plan:   Principal Problem:   Severe sepsis ([place]) Active Problems:   Hypokalemia   Lymphadenopathy   Back pain   Hyponatremia   Right ovarian cyst   Hypotension   MSSA bacteremia   Abscess in epidural space of thoracic spine   Mediastinal mass   Obesity, Class III, BMI 40-49.9 (morbid obesity) (HCC)   Leucocytosis   Neck pain   Abscess in epidural space of cervical spine   MSSA bacteremia -TEE negative for endocarditis 11/26 -Blood cultures negative from 11/25 -Clarified with ID on 12/27; plan is for Ancef and rifampin for 8 weeks total. Patient will get treatment in the hospital secondary to history of IV drug use.  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 Will need to wean as patient will not be given prescriptions on discharge and she does not have a pain management physician -Continue oxycodone 10 mg q 6 hours today and decrease to 5 mg starting tomorrow   Obesity, Class III, BMI 40-49.9 (morbid obesity) ([place]) -Counseled on diet and weight control.,  Nutrition consult  Reactive thrombocytosis -Last seen by Dr. Jana Hakim on 11/18 to rule out lymphoma. Resolved currently    Hepatitis C -Outpatient follow-up with ID/GI once IV antibiotics is complete  Normocytic iron deficiency anemia -H&H stable and baseline, outpatient follow-up with Dr. Jana Hakim  Hyponatremia -Resolved  Elevated alkaline phosphatase No abdominal pain. Resolved. Unknown etiology.   DVT prophylaxis: Heparin Code Status: Full code Family Communication: None at bedside Disposition Plan: Discharge home once IV antibiotics therapy completed   Consultants:   Infectious disease  Hematology  Neurosurgery  Cardiology  Procedures:  Emergent C3-C7 laminectomy for decompression of spinal cord and debridement of epidural abscesson 09/15/2017  TEE on 09/20/2017: No evidence of vegetations  Transthoracic ECHO on 09/12/2017 Study Conclusions  - Left ventricle: The cavity size was normal. Wall thickness was normal. Systolic function was normal. The estimated ejection fraction was in the range of 55% to 60%. Wall motion was normal; there were no regional wall motion abnormalities. Left ventricular diastolic function parameters were normal. - Mitral valve: There was mild regurgitation. - Pulmonary arteries: Systolic pressure was mildly increased. PA peak pressure: 39 mm Hg (S).     Antimicrobials:  Cefazolin  Rifampin  Nafcillin  Subjective: Sleeping. No overnight events.  Objective: Vitals:   11/03/17 2347 11/04/17 0325  11/04/17 0736 11/04/17 1133  BP: 121/77 112/61 106/78 115/72  Pulse: 63 80  68  Resp: 16 17    Temp: 98.3 F (36.8 C) 98 F (36.7 C) 97.8 F (36.6 C) 98 F (36.7 C)  TempSrc: Oral Oral Oral Oral  SpO2: 98% 99%  96%  Weight:      Height:        Intake/Output Summary (Last 24 hours) at 11/04/2017 1244 Last data filed at 11/04/2017 0915 Gross per 24 hour  Intake 340 ml  Output -  Net 340 ml   Filed Weights   09/13/17 0730 09/14/17 0831 09/21/17 0400  Weight: 117.4 kg (258 lb 13.1 oz) 115.8 kg (255 lb 4.7 oz) 111.1 kg (244 lb 14.9 oz)    Examination:  General exam: Appears calm and comfortable Respiratory system: Respiratory effort normal.    Data Reviewed: I have personally reviewed following labs and imaging studies  CBC: Recent Labs  Lab 11/01/17 0500 11/03/17 0500  WBC 6.4 6.9  HGB 11.6* 12.0  HCT 36.0 36.4  MCV 92.1 92.2  PLT 325 242   Basic Metabolic Panel: Recent Labs  Lab 11/01/17 0500 11/03/17 0500  NA 135 137  K 4.2 4.3  CL 101 101  CO2 29 29  GLUCOSE 106* 94  BUN 14 13  CREATININE 0.57 0.57  CALCIUM 9.1 9.3   GFR: Estimated Creatinine Clearance: 107.4 mL/min (by C-G formula based on SCr of 0.57 mg/dL). Liver Function Tests: No results for input(s): AST, ALT, ALKPHOS, BILITOT, PROT, ALBUMIN in the last 168 hours. No results for input(s): LIPASE, AMYLASE in the last 168 hours. No results for input(s): AMMONIA in the last 168 hours. Coagulation Profile: No results for input(s): INR, PROTIME in the last 168 hours. Cardiac Enzymes: No results for input(s): CKTOTAL, CKMB, CKMBINDEX, TROPONINI in the last 168 hours. BNP (last 3 results) No results for input(s): PROBNP in the last 8760 hours. HbA1C: No results for input(s): HGBA1C in the last 72 hours. CBG: No results for input(s): GLUCAP in the last 168 hours. Lipid Profile: No results for input(s): CHOL, HDL, LDLCALC, TRIG, CHOLHDL, LDLDIRECT in the last 72 hours. Thyroid Function  Tests: No results for input(s): TSH, T4TOTAL, FREET4, T3FREE, THYROIDAB in the last 72 hours. Anemia Panel: No results for input(s): VITAMINB12, FOLATE, FERRITIN, TIBC, IRON, RETICCTPCT in the last 72 hours. Sepsis Labs: No results for input(s): PROCALCITON, LATICACIDVEN in the last 168 hours.  No results found for this or any previous visit (from the past 240 hour(s)).       Radiology Studies: No results found.      Scheduled Meds: . Chlorhexidine Gluconate Cloth  6 each Topical Q2200  . DULoxetine  60 mg Oral Daily  . enoxaparin (LOVENOX) injection  40 mg Subcutaneous Q24H  . rifampin  300 mg Oral Q12H  . senna-docusate  1 tablet Oral BID  . sodium chloride flush  10-40 mL Intracatheter Q12H   Continuous Infusions: .  ceFAZolin (ANCEF) IV Stopped (11/04/17 0730)     LOS: 11 days     Cordelia Poche, MD [facility] 11/04/2017, 12:44 PM Pager: [phone]  If 7PM-7AM, please contact night-coverage [email or web address] Password [facility] 11/04/2017, 12:44 PM

## 2017-11-04 NOTE — Progress Notes (Signed)
1900: Handoff report received from RN. Pt ambulating around unit. Appears in no physical distress. Pt is anxious about pending changes to PRN opiates. She understands the need to wean, but is concerned that she will go through a difficult withdrawal and therefore be at greater risk of seeking street drugs upon discharge. I educated the pt on COWS and completed an assessment (COWS=5). PRN anxiolytic administered.   0000: Pt resting comfortably.  0400: Pt continues resting comfortably.  0700: Handoff report given to RN. No acute events overnight.

## 2017-11-04 NOTE — Progress Notes (Signed)
Pt c/o feeling tired, "did not sleep last night" so rested until lunch. Pt ambulates throughout the day with no assistance. Pt is independent in turning and hygiene, did received CHG bath, pain pills, and ancef via right PICC.

## 2017-11-05 MED ORDER — CHLORHEXIDINE GLUCONATE CLOTH 2 % EX PADS: 6.0000 | MEDICATED_PAD | Freq: Every day | CUTANEOUS | Status: DC

## 2017-11-05 MED ORDER — CLONIDINE HCL 0.1 MG PO TABS: 0.1000 mg | ORAL_TABLET | ORAL | Status: DC

## 2017-11-05 MED ORDER — LOPERAMIDE HCL 2 MG PO CAPS: 2.0000 mg | ORAL_CAPSULE | ORAL | Status: AC | PRN

## 2017-11-05 MED ORDER — CLONIDINE HCL 0.1 MG PO TABS: 0.1000 mg | ORAL_TABLET | Freq: Four times a day (QID) | ORAL | Status: DC

## 2017-11-05 MED ORDER — CHLORHEXIDINE GLUCONATE CLOTH 2 % EX PADS
6.0000 | MEDICATED_PAD | Freq: Every day | CUTANEOUS | Status: DC
  Administered 2017-11-05 – 2017-11-14 (×11): 6 via TOPICAL

## 2017-11-05 MED ORDER — DICYCLOMINE HCL 20 MG PO TABS: 20.0000 mg | ORAL_TABLET | Freq: Four times a day (QID) | ORAL | Status: AC | PRN

## 2017-11-05 MED ORDER — CLONIDINE HCL 0.1 MG PO TABS: 0.1000 mg | ORAL_TABLET | Freq: Every day | ORAL | Status: DC

## 2017-11-05 NOTE — Progress Notes (Signed)
PROGRESS NOTE    [name]  SEG:[id number] DOB: 1966-04-12 DOA: 09/11/2017 PCP: Marliss Coots, NP   Brief Narrative: [name] is a 52 y.o. female with history of PTSD, depression, intravenous drug use, chronic back pain status post previous back surgery, ADD, bilateral ovarian cysts, PID, sciatica presented on 09/11/2017 with severe back and neck pain. MRI of the T and L-spine revealed ventral epidural abscess. She was found to have MSSA bacteremia. She was put on IV antibiotics. ID and neurosurgery were consulted. Patient was transferred to [facility]. She developed urinary retention on 09/15/2017 for which she required emergent C3-C7 laminectomy for decompression of spinal cord and debridement of epidural abscess. She had TEE on 09/20/2017 which was negative for vegetations. ID recommends at least 6 weeks of IV antibiotics and last negative Blood Cx were 09/19/17.   Assessment & Plan:   Principal Problem:   Severe sepsis ([place]) Active Problems:   Hypokalemia   Lymphadenopathy   Back pain   Hyponatremia   Right ovarian cyst   Hypotension   MSSA bacteremia   Abscess in epidural space of thoracic spine   Mediastinal mass   Obesity, Class III, BMI 40-49.9 (morbid obesity) (HCC)   Leucocytosis   Neck pain   Abscess in epidural space of cervical spine   MSSA bacteremia -TEE negative for endocarditis 11/26 -Blood cultures negative from 11/25 -Clarified with ID on 12/27; plan is for Ancef and rifampin for 8 weeks total. Patient will get treatment in the hospital secondary to history of IV drug use.  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 Will need to wean as patient will not be given prescriptions on discharge and she does not have a pain management physician -Oxycodone decreased to 5 mg q6 hours prn; anticipate decreasing frequency in 2-3 days if stable -COWS -Clonidine taper scheduled to start on 11/10/2017. May need to adjust depending on symptoms -Continue Robaxin and Atarax   Obesity, Class III, BMI 40-49.9 (morbid obesity) ([place]) -Counseled on diet and weight control.,  Nutrition consult  Reactive thrombocytosis -Last seen by Dr. Jana Hakim on 11/18 to rule out lymphoma. Resolved currently    Hepatitis C -Outpatient follow-up with ID/GI once IV antibiotics is complete  Normocytic iron deficiency anemia -H&H stable and baseline, outpatient follow-up with Dr. Jana Hakim  Hyponatremia -Resolved  Elevated alkaline phosphatase No abdominal pain. Resolved. Unknown etiology.   DVT prophylaxis: Heparin Code Status: Full code Family Communication: None at bedside Disposition Plan: Discharge home once IV antibiotics therapy completed   Consultants:   Infectious disease  Hematology  Neurosurgery  Cardiology  Procedures:  Emergent C3-C7 laminectomy for decompression of spinal cord and debridement of epidural abscesson 09/15/2017  TEE on 09/20/2017: No evidence of vegetations  Transthoracic ECHO on 09/12/2017 Study Conclusions  - Left ventricle: The cavity size was normal. Wall thickness was normal. Systolic function was normal. The estimated ejection fraction was in the range of 55% to 60%. Wall motion was normal; there were no regional wall motion abnormalities. Left ventricular diastolic function parameters were normal. - Mitral valve: There was mild regurgitation. - Pulmonary arteries: Systolic pressure was mildly increased.  PA peak pressure: 39 mm Hg  (S).     Antimicrobials:  Cefazolin  Rifampin  Nafcillin    Subjective: Chills and hot flashes. No diarrhea. Anxious overnight.  Objective: Vitals:   11/05/17 0015 11/05/17 0330 11/05/17 0800 11/05/17 1200  BP: (!) 115/58 109/80 102/77 118/73  Pulse: 76 73 72 77  Resp: 16 16 12 16   Temp: 98 F (36.7 C) 97.9 F (36.6 C) 97.8 F (36.6 C) 98.1 F (36.7 C)  TempSrc: Oral Oral Oral Oral  SpO2: 96% 99% 97% 98%  Weight:      Height:        Intake/Output Summary (Last 24 hours) at 11/05/2017 1421 Last data filed at 11/05/2017 0800 Gross per 24 hour  Intake 980 ml  Output -  Net 980 ml   Filed Weights   09/13/17 0730 09/14/17 0831 09/21/17 0400  Weight: 117.4 kg (258 lb 13.1 oz) 115.8 kg (255 lb 4.7 oz) 111.1 kg (244 lb 14.9 oz)    Examination:  General exam: Appears calm and comfortable Respiratory system: Clear to auscultation. Respiratory effort normal. Cardiovascular: Regular rate and rhythm. Normal S1 and S2. No heart murmurs present. No extra heart sounds Gastrointestinal: Soft, non-tender, non-distended, no guarding, no rebound, no masses felt Psych: anxious about opiate taper    Data Reviewed: I have personally reviewed following labs and imaging studies  CBC: Recent Labs  Lab 11/01/17 0500 11/03/17 0500  WBC 6.4 6.9  HGB 11.6* 12.0  HCT 36.0 36.4  MCV 92.1 92.2  PLT 325 749   Basic Metabolic Panel: Recent Labs  Lab 11/01/17 0500 11/03/17 0500  NA 135 137  K 4.2 4.3  CL 101 101  CO2 29 29  GLUCOSE 106* 94  BUN 14 13  CREATININE 0.57 0.57  CALCIUM 9.1 9.3   GFR: Estimated Creatinine Clearance: 107.4 mL/min (by C-G formula based on SCr of 0.57 mg/dL). Liver Function Tests: No results for input(s): AST, ALT, ALKPHOS, BILITOT, PROT, ALBUMIN in the last 168 hours. No results for input(s): LIPASE, AMYLASE in the last 168 hours. No results for input(s): AMMONIA in the last 168 hours. Coagulation Profile: No results for input(s): INR,  PROTIME in the last 168 hours. Cardiac Enzymes: No results for input(s): CKTOTAL, CKMB, CKMBINDEX, TROPONINI in the last 168 hours. BNP (last 3 results) No results for input(s): PROBNP in the last 8760 hours. HbA1C: No results for input(s): HGBA1C in the last 72 hours. CBG: No results for input(s): GLUCAP in the last 168 hours. Lipid Profile: No results for input(s): CHOL, HDL, LDLCALC, TRIG, CHOLHDL, LDLDIRECT in the last 72 hours. Thyroid Function Tests: No results for input(s): TSH, T4TOTAL, FREET4, T3FREE, THYROIDAB in the last 72 hours. Anemia Panel: No results for input(s): VITAMINB12, FOLATE, FERRITIN, TIBC, IRON, RETICCTPCT in the last 72 hours. Sepsis Labs: No results for input(s): PROCALCITON, LATICACIDVEN in the last 168 hours.  No results found for this or any previous visit (from the past 240 hour(s)).       Radiology Studies: No results found.      Scheduled Meds: . Chlorhexidine Gluconate Cloth  6 each Topical Daily  . [START ON 11/10/2017] cloNIDine  0.1 mg Oral QID   Followed by  . [START ON 11/12/2017] cloNIDine  0.1 mg Oral BH-qamhs   Followed by  . [START ON 11/14/2017] cloNIDine  0.1 mg Oral QAC breakfast  . DULoxetine  60 mg Oral Daily  . enoxaparin (LOVENOX) injection  40 mg Subcutaneous Q24H  . rifampin  300 mg Oral Q12H  . senna-docusate  1 tablet Oral BID  . sodium chloride flush  10-40 mL Intracatheter Q12H   Continuous Infusions: .  ceFAZolin (ANCEF) IV 2 g (11/05/17 1348)     LOS: 107 days     Cordelia Poche, MD [facility] 11/05/2017, 2:21 PM Pager: [phone]  If 7PM-7AM, please contact night-coverage [email or web address] Password [email or web address] 11/05/2017, 2:21 PM

## 2017-11-05 NOTE — Progress Notes (Signed)
1900: Handoff report received from RN. Pt resting comfortably. After discussion with provider concerning pain medication weaning, pt is much more comfortably with plan of care moving forward.  0000: Pt resting comfortably.  0400: Pt continues resting comfortably.  0700: Handoff report given to RN. No acute events overnight.

## 2017-11-06 NOTE — Progress Notes (Signed)
1900: Handoff report received from RN. Pt ambulating about unit.  0000: Pt resting comfortably.  0400: Pt continues resting comfortably.  0700: Handoff report given to RN. No acute events overnight.

## 2017-11-06 NOTE — Progress Notes (Signed)
PROGRESS NOTE    KHAI TORBERT  JJO:[id number] DOB: March 27, 1966 DOA: 09/11/2017 PCP: Marliss Coots, NP   Brief Narrative: [name] is a 52 y.o. female with history of PTSD, depression, intravenous drug use, chronic back pain status post previous back surgery, ADD, bilateral ovarian cysts, PID, sciatica presented on 09/11/2017 with severe back and neck pain. MRI of the T and L-spine revealed ventral epidural abscess. She was found to have MSSA bacteremia. She was put on IV antibiotics. ID and neurosurgery were consulted. Patient was transferred to [facility]. She developed urinary retention on 09/15/2017 for which she required emergent C3-C7 laminectomy for decompression of spinal cord and debridement of epidural abscess. She had TEE on 09/20/2017 which was negative for vegetations. ID recommends at least 6 weeks of IV antibiotics and last negative Blood Cx were 09/19/17.   Assessment & Plan:   Principal Problem:   Severe sepsis ([place]) Active Problems:   Hypokalemia   Lymphadenopathy   Back pain   Hyponatremia   Right ovarian cyst   Hypotension   MSSA bacteremia   Abscess in epidural space of thoracic spine   Mediastinal mass   Obesity, Class III, BMI 40-49.9 (morbid obesity) (HCC)   Leucocytosis   Neck pain   Abscess in epidural space of cervical spine   MSSA bacteremia -TEE negative for endocarditis 11/26 -Blood cultures negative from 11/25 -Clarified with ID on 12/27; plan is for Ancef and rifampin for 8 weeks total. Patient will get treatment in the hospital secondary to history of IV drug use.  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 Will need to wean as patient will not be given prescriptions on discharge and she does not have a pain management physician -Continue Oxycodone 5 mg q6 hours prn; anticipate decreasing frequency in 1-2 days if stable -COWS -Clonidine taper scheduled to start on 11/10/2017. May need to adjust depending on symptoms -Continue Robaxin and Atarax   Obesity, Class III, BMI 40-49.9 (morbid obesity) ([place]) -Counseled on diet and weight control.,  Nutrition consult  Reactive thrombocytosis -Last seen by Dr. Jana Hakim on 11/18 to rule out lymphoma. Resolved currently    Hepatitis C -Outpatient follow-up with ID/GI once IV antibiotics is complete  Normocytic iron deficiency anemia -H&H stable and baseline, outpatient follow-up with Dr. Jana Hakim  Hyponatremia -Resolved  Elevated alkaline phosphatase No abdominal pain. Resolved. Unknown etiology.   DVT prophylaxis: Heparin Code Status: Full code Family Communication: None at bedside Disposition Plan: Discharge home once IV antibiotics therapy completed   Consultants:   Infectious disease  Hematology  Neurosurgery  Cardiology  Procedures:  Emergent C3-C7 laminectomy for decompression of spinal cord and debridement of epidural abscesson 09/15/2017  TEE on 09/20/2017: No evidence of vegetations  Transthoracic ECHO on 09/12/2017 Study Conclusions  - Left ventricle: The cavity size was normal. Wall thickness was normal. Systolic function was normal. The estimated ejection fraction was in the range of 55% to 60%. Wall motion was normal; there were no regional wall motion abnormalities. Left ventricular diastolic function parameters were normal. - Mitral valve: There was mild regurgitation. - Pulmonary arteries: Systolic pressure was mildly increased. PA  peak pressure: 39 mm Hg  (S).     Antimicrobials:  Cefazolin  Rifampin  Nafcillin    Subjective: Mild chills. Afebrile. No diarrhea or nausea.  Objective: Vitals:   11/05/17 2346 11/06/17 0402 11/06/17 0747 11/06/17 1211  BP: 127/84 112/68 107/65 115/74  Pulse: 73 64    Resp: 17 17    Temp: 98 F (36.7 C) 97.7 F (36.5 C) 97.6 F (36.4 C) 98.1 F (36.7 C)  TempSrc: Oral Oral Oral Oral  SpO2: 99% 99%    Weight:      Height:        Intake/Output Summary (Last 24 hours) at 11/06/2017 1223 Last data filed at 11/06/2017 0924 Gross per 24 hour  Intake 690 ml  Output -  Net 690 ml   Filed Weights   09/13/17 0730 09/14/17 0831 09/21/17 0400  Weight: 117.4 kg (258 lb 13.1 oz) 115.8 kg (255 lb 4.7 oz) 111.1 kg (244 lb 14.9 oz)    Examination:  General exam: Appears calm and comfortable Respiratory system: Respiratory effort normal. Psych: Flat affect. Alert and oriented   Data Reviewed: I have personally reviewed following labs and imaging studies  CBC: Recent Labs  Lab 11/01/17 0500 11/03/17 0500  WBC 6.4 6.9  HGB 11.6* 12.0  HCT 36.0 36.4  MCV 92.1 92.2  PLT 325 196   Basic Metabolic Panel: Recent Labs  Lab 11/01/17 0500 11/03/17 0500  NA 135 137  K 4.2 4.3  CL 101 101  CO2 29 29  GLUCOSE 106* 94  BUN 14 13  CREATININE 0.57 0.57  CALCIUM 9.1 9.3   GFR: Estimated Creatinine Clearance: 107.4 mL/min (by C-G formula based on SCr of 0.57 mg/dL). Liver Function Tests: No results for input(s): AST, ALT, ALKPHOS, BILITOT, PROT, ALBUMIN in the last 168 hours. No results for input(s): LIPASE, AMYLASE in the last 168 hours. No results for input(s): AMMONIA in the last 168 hours. Coagulation Profile: No results for input(s): INR, PROTIME in the last 168 hours. Cardiac Enzymes: No results for input(s): CKTOTAL, CKMB, CKMBINDEX, TROPONINI in the last 168 hours. BNP (last 3 results) No results for input(s): PROBNP in the last 8760 hours. HbA1C: No results for input(s):  HGBA1C in the last 72 hours. CBG: No results for input(s): GLUCAP in the last 168 hours. Lipid Profile: No results for input(s): CHOL, HDL, LDLCALC, TRIG, CHOLHDL, LDLDIRECT in the last 72 hours. Thyroid Function Tests: No results for input(s): TSH, T4TOTAL, FREET4, T3FREE, THYROIDAB in the last 72 hours. Anemia Panel: No results for input(s): VITAMINB12, FOLATE, FERRITIN, TIBC, IRON, RETICCTPCT in the last 72 hours. Sepsis Labs: No results for input(s): PROCALCITON, LATICACIDVEN in the last 168 hours.  No results found for this or any previous visit (from the past 240 hour(s)).       Radiology Studies: No results found.      Scheduled Meds: . Chlorhexidine Gluconate Cloth  6 each Topical Daily  . [START ON 11/10/2017] cloNIDine  0.1 mg Oral QID   Followed by  . [START ON 11/12/2017] cloNIDine  0.1 mg Oral BH-qamhs   Followed by  . [START ON 11/14/2017] cloNIDine  0.1 mg Oral QAC breakfast  . DULoxetine  60 mg Oral Daily  . enoxaparin (LOVENOX) injection  40 mg Subcutaneous Q24H  . rifampin  300 mg Oral Q12H  . senna-docusate  1 tablet Oral BID  . sodium chloride flush  10-40 mL Intracatheter Q12H   Continuous Infusions: .  ceFAZolin (ANCEF) IV Stopped (11/06/17  0848)     LOS: 42 days     Cordelia Poche, MD [facility] 11/06/2017, 12:23 PM Pager: [phone]  If 7PM-7AM, please contact night-coverage [email or web address] Password [email or web address] 11/06/2017, 12:23 PM

## 2017-11-07 MED ORDER — OXYCODONE HCL 5 MG PO TABS
5.0000 mg | ORAL_TABLET | Freq: Four times a day (QID) | ORAL | Status: AC | PRN
  Administered 2017-11-07 (×2): 5 mg via ORAL
  Filled 2017-11-07 (×2): qty 1

## 2017-11-07 MED ORDER — OXYCODONE HCL 5 MG PO TABS
5.0000 mg | ORAL_TABLET | Freq: Three times a day (TID) | ORAL | Status: DC | PRN
  Administered 2017-11-08 – 2017-11-10 (×8): 5 mg via ORAL
  Filled 2017-11-07 (×8): qty 1

## 2017-11-07 MED ORDER — IBUPROFEN 200 MG PO TABS
600.0000 mg | ORAL_TABLET | Freq: Four times a day (QID) | ORAL | Status: DC | PRN
  Administered 2017-11-07 – 2017-11-14 (×11): 600 mg via ORAL
  Filled 2017-11-07 (×11): qty 3

## 2017-11-07 MED ORDER — OXYCODONE HCL 5 MG PO TABS: 5.0000 mg | ORAL_TABLET | Freq: Four times a day (QID) | ORAL | Status: DC | PRN

## 2017-11-07 NOTE — Progress Notes (Signed)
PROGRESS NOTE    MAAHI LANNAN  SNK:[id number] DOB: May 02, 1966 DOA: 09/11/2017 PCP: Marliss Coots, NP   Brief Narrative: [name] is a 52 y.o. female with history of PTSD, depression, intravenous drug use, chronic back pain status post previous back surgery, ADD, bilateral ovarian cysts, PID, sciatica presented on 09/11/2017 with severe back and neck pain. MRI of the T and L-spine revealed ventral epidural abscess. She was found to have MSSA bacteremia. She was put on IV antibiotics. ID and neurosurgery were consulted. Patient was transferred to [facility]. She developed urinary retention on 09/15/2017 for which she required emergent C3-C7 laminectomy for decompression of spinal cord and debridement of epidural abscess. She had TEE on 09/20/2017 which was negative for vegetations. ID recommends at least 6 weeks of IV antibiotics and last negative Blood Cx were 09/19/17.   Assessment & Plan:   Principal Problem:   Severe sepsis ([place]) Active Problems:   Hypokalemia   Lymphadenopathy   Back pain   Hyponatremia   Right ovarian cyst   Hypotension   MSSA bacteremia   Abscess in epidural space of thoracic spine   Mediastinal mass   Obesity, Class III, BMI 40-49.9 (morbid obesity) (HCC)   Leucocytosis   Neck pain   Abscess in epidural space of cervical spine   MSSA bacteremia -TEE negative for endocarditis 11/26 -Blood cultures negative from 11/25 -Clarified with ID on 12/27; plan is for Ancef and rifampin for 8 weeks total. Patient will get treatment in the hospital secondary to history of IV drug use.  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 Will need to wean as patient will not be given prescriptions on discharge and she does not have a pain management physician -Continue Oxycodone 5 mg q6 hours prn; decrease to q 8 hours prn starting 1/14 -Ibuprofen 600 mg prn -COWS -Clonidine taper scheduled to start on 11/10/2017. May need to adjust depending on symptoms -Continue Robaxin and Atarax   Obesity, Class III, BMI 40-49.9 (morbid obesity) ([place]) -Counseled on diet and weight control.,  Nutrition consult  Reactive thrombocytosis -Last seen by Dr. Jana Hakim on 11/18 to rule out lymphoma. Resolved currently   Hepatitis C -Outpatient follow-up with ID/GI once IV antibiotics is complete  Normocytic iron deficiency anemia -H&H stable and baseline, outpatient follow-up with Dr. Jana Hakim  Hyponatremia -Resolved  Elevated alkaline phosphatase No abdominal pain. Resolved. Unknown etiology.   DVT prophylaxis: Heparin Code Status: Full code Family Communication: None at bedside Disposition Plan: Discharge home once IV antibiotics therapy completed   Consultants:   Infectious disease  Hematology  Neurosurgery  Cardiology  Procedures:  Emergent C3-C7 laminectomy for decompression of spinal cord and debridement of epidural abscesson 09/15/2017  TEE on 09/20/2017: No evidence of vegetations  Transthoracic ECHO on 09/12/2017 Study Conclusions  - Left ventricle: The cavity size was normal. Wall thickness was normal. Systolic function was normal. The estimated ejection fraction was in the range of 55% to 60%. Wall motion was normal; there were no regional wall motion abnormalities. Left ventricular diastolic function parameters were normal. - Mitral valve: There was mild regurgitation. - Pulmonary arteries: Systolic pressure was  mildly increased. PA peak pressure: 39 mm Hg  (S).     Antimicrobials:  Cefazolin  Rifampin  Nafcillin    Subjective: No issues overnight.  Objective: Vitals:   11/06/17 2303 11/07/17 0419 11/07/17 0715 11/07/17 0740  BP: 122/82 124/77  125/88  Pulse: 71 65  68  Resp: 18 16  14   Temp: 97.9 F (36.6 C) 98.3 F (36.8 C) 97.9 F (36.6 C)   TempSrc: Oral Oral Oral   SpO2: 99% 99%  99%  Weight:      Height:        Intake/Output Summary (Last 24 hours) at 11/07/2017 1127 Last data filed at 11/07/2017 0900 Gross per 24 hour  Intake 1120 ml  Output 2 ml  Net 1118 ml   Filed Weights   09/13/17 0730 09/14/17 0831 09/21/17 0400  Weight: 117.4 kg (258 lb 13.1 oz) 115.8 kg (255 lb 4.7 oz) 111.1 kg (244 lb 14.9 oz)    Examination:  General exam: Appears calm and comfortable Respiratory system: Respiratory effort normal. Psych: Flat affect. Alert and oriented   Data Reviewed: I have personally reviewed following labs and imaging studies  CBC: Recent Labs  Lab 11/01/17 0500 11/03/17 0500  WBC 6.4 6.9  HGB 11.6* 12.0  HCT 36.0 36.4  MCV 92.1 92.2  PLT 325 161   Basic Metabolic Panel: Recent Labs  Lab 11/01/17 0500 11/03/17 0500  NA 135 137  K 4.2 4.3  CL 101 101  CO2 29 29  GLUCOSE 106* 94  BUN 14 13  CREATININE 0.57 0.57  CALCIUM 9.1 9.3   GFR: Estimated Creatinine Clearance: 107.4 mL/min (by C-G formula based on SCr of 0.57 mg/dL). Liver Function Tests: No results for input(s): AST, ALT, ALKPHOS, BILITOT, PROT, ALBUMIN in the last 168 hours. No results for input(s): LIPASE, AMYLASE in the last 168 hours. No results for input(s): AMMONIA in the last 168 hours. Coagulation Profile: No results for input(s): INR, PROTIME in the last 168 hours. Cardiac Enzymes: No results for input(s): CKTOTAL, CKMB, CKMBINDEX, TROPONINI in the last 168 hours. BNP (last 3 results) No results for input(s): PROBNP in the last 8760 hours. HbA1C: No results for input(s): HGBA1C in the last 72 hours. CBG: No  results for input(s): GLUCAP in the last 168 hours. Lipid Profile: No results for input(s): CHOL, HDL, LDLCALC, TRIG, CHOLHDL, LDLDIRECT in the last 72 hours. Thyroid Function Tests: No results for input(s): TSH, T4TOTAL, FREET4, T3FREE, THYROIDAB in the last 72 hours. Anemia Panel: No results for input(s): VITAMINB12, FOLATE, FERRITIN, TIBC, IRON, RETICCTPCT in the last 72 hours. Sepsis Labs: No results for input(s): PROCALCITON, LATICACIDVEN in the last 168 hours.  No results found for this or any previous visit (from the past 240 hour(s)).       Radiology Studies: No results found.      Scheduled Meds: . Chlorhexidine Gluconate Cloth  6 each Topical Daily  . [START ON 11/10/2017] cloNIDine  0.1 mg Oral QID   Followed by  . [START ON 11/12/2017] cloNIDine  0.1 mg Oral BH-qamhs   Followed by  . [START ON 11/14/2017] cloNIDine  0.1 mg Oral QAC breakfast  . DULoxetine  60 mg Oral Daily  . enoxaparin (LOVENOX) injection  40 mg Subcutaneous Q24H  . rifampin  300 mg Oral Q12H  . senna-docusate  1 tablet Oral BID  . sodium chloride flush  10-40 mL Intracatheter Q12H   Continuous Infusions: .  ceFAZolin (ANCEF) IV Stopped (11/07/17 0960)  LOS: 70 days     Cordelia Poche, MD [facility] 11/07/2017, 11:27 AM Pager: [phone]  If 7PM-7AM, please contact night-coverage [email or web address] Password [facility] 11/07/2017, 11:27 AM

## 2017-11-08 NOTE — Care Management Note (Addendum)
Case Management Note Per Previous note:  Reinaldo Raddle, RN, BSN  Trauma/Neuro ICU Case Manager [phone]  Patient Details  Name: [name] MRN: [id number] Date of Birth: 1965/11/01  Subjective/Objective:     52 year old female w/ a history of IV drug use, hep C and chronic back pain status post previous back surgery who presented to the ED with severe back and neck pain.  An MRI of the T and L-spine revealed a ventral epidural abscess C2-t8. 1/21 Pt with emergent C3-7 lami and epidural abscess debridement.  PTA, pt independent, lives in a boarding house.                Action/Plan: PT recommending HHPT, DME for home.  Pt plans to dc home with sister at dc. Pt uninsured; uncertain if pt will qualify for home therapies with current diagnosis as uninsured pt.  Will verify this.  Pt may need medication assistance at discharge.  Will follow progress.   Expected Discharge Date:                  Expected Discharge Plan:  Home/Self Care  In-House Referral:  Clinical Social Work  Discharge planning Services  CM Consult  Status of Service:  In process, will continue to follow  If discussed at Long Length of Stay Meetings, dates discussed:    Additional Comments:  09/28/17 J. Amerson, RN, BSN Pt requires total of 6 weeks IV antibiotic therapy for treatment of MSSA bacteremia and spinal abscess.  Pt with recent heroin use (4 months ago), and we will not dc home with PICC line.  CSW following to facilitate discharge to SNF for IV antibiotic therapy.  Disposition difficult due to no payor source and IVDU.  Will continue to follow/assist with discharge planning.    11/03/17 J. Amerson, RN, BSN Pt continues on IV antibiotic treatment for MSSA bacteremia.  She was unable to be placed in SNF due to ankle monitoring bracelet.  IV antibiotics to continue through 11/13/17.  Pt ambulating independently without assistive device.  Will continue to follow.   Reinaldo Raddle, RN, BSN  Trauma/Neuro  ICU Case Manager [phone] --  Additional Comments: 11/08/2017 In to speak with patient, Patient states she uses [facility] in [place]. States her PCP is "Leda Gauze, Journalist, newspaper) at [facility] in [place].  Noted patient has No insurance.  Denies inability to afford food.  Admits to ability to afford medications.   Pt states she will be going to her sisters house at discharge at: [address] [place], [place], [place] [address].  States her boyfriend can take her to medical appointments.  Home DME: None.  Patient denies need for DME prior to discharge.  NCM will continue to follow for discharge needs.  Kristen Cardinal, RN  Case Manager [phone] 11/08/2017, 2:40 PM

## 2017-11-08 NOTE — Progress Notes (Signed)
PROGRESS NOTE    CLORINDA WYBLE  UTM:[id number] DOB: 03/08/1966 DOA: 09/11/2017 PCP: Marliss Coots, NP   Brief Narrative: [name] is a 52 y.o. female with history of PTSD, depression, intravenous drug use, chronic back pain status post previous back surgery, ADD, bilateral ovarian cysts, PID, sciatica presented on 09/11/2017 with severe back and neck pain. MRI of the T and L-spine revealed ventral epidural abscess. She was found to have MSSA bacteremia. She was put on IV antibiotics. ID and neurosurgery were consulted. Patient was transferred to [facility]. She developed urinary retention on 09/15/2017 for which she required emergent C3-C7 laminectomy for decompression of spinal cord and debridement of epidural abscess. She had TEE on 09/20/2017 which was negative for vegetations. ID recommends at least 6 weeks of IV antibiotics and last negative Blood Cx were 09/19/17.   Assessment & Plan:   Principal Problem:   Severe sepsis ([place]) Active Problems:   Hypokalemia   Lymphadenopathy   Back pain   Hyponatremia   Right ovarian cyst   Hypotension   MSSA bacteremia   Abscess in epidural space of thoracic spine   Mediastinal mass   Obesity, Class III, BMI 40-49.9 (morbid obesity) (HCC)   Leucocytosis   Neck pain   Abscess in epidural space of cervical spine   MSSA bacteremia -TEE negative for endocarditis 11/26 -Blood cultures negative from 11/25 -Clarified with ID on 12/27; plan is for Ancef and rifampin for 8 weeks total. Patient will get treatment in the hospital secondary to history of IV drug use.  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 Will need to wean as patient will not be given prescriptions on discharge and she does not have a pain management physician -Continue Oxycodone 5 mg q 8 hours prn -Ibuprofen 600 mg prn, voltaren gel prn -COWS -Clonidine taper scheduled to start on 11/10/2017. May need to adjust depending on symptoms -Continue Robaxin and Atarax   Obesity, Class III, BMI 40-49.9 (morbid obesity) ([place]) -Counseled on diet and weight control.,  Nutrition consult  Reactive thrombocytosis -Last seen by Dr. Jana Hakim on 11/18 to rule out lymphoma. Resolved currently   Hepatitis C -Outpatient follow-up with ID/GI once IV antibiotics is complete  Normocytic iron deficiency anemia -H&H stable and baseline, outpatient follow-up with Dr. Jana Hakim  Hyponatremia -Resolved  Elevated alkaline phosphatase No abdominal pain. Resolved. Unknown etiology.   DVT prophylaxis: Heparin Code Status: Full code Family Communication: None at bedside Disposition Plan: Discharge home once IV antibiotics therapy completed   Consultants:   Infectious disease  Hematology  Neurosurgery  Cardiology  Procedures:  Emergent C3-C7 laminectomy for decompression of spinal cord and debridement of epidural abscesson 09/15/2017  TEE on 09/20/2017: No evidence of vegetations  Transthoracic ECHO on 09/12/2017 Study Conclusions  - Left ventricle: The cavity size was normal. Wall thickness was normal. Systolic function was normal. The estimated ejection fraction was in the range of 55% to 60%. Wall motion was normal; there were no regional wall motion abnormalities. Left ventricular diastolic function parameters were normal. - Mitral valve: There was mild regurgitation. - Pulmonary arteries: Systolic pressure was mildly increased. PA peak  pressure: 39 mm Hg  (S).     Antimicrobials:  Cefazolin  Rifampin  Nafcillin    Subjective: Some increased neck soreness. Sleeping on her side exacerbates symptoms.  Objective: Vitals:   11/07/17 2300 11/08/17 0343 11/08/17 0705 11/08/17 0710  BP: 125/83 115/82  118/75  Pulse: 63 68  (!) 58  Resp: 16 18  14   Temp: 97.6 F (36.4 C) 98 F (36.7 C) 97.8 F (36.6 C)   TempSrc: Oral Oral    SpO2: 99% 98%  98%  Weight:      Height:        Intake/Output Summary (Last 24 hours) at 11/08/2017 0940 Last data filed at 11/08/2017 6301 Gross per 24 hour  Intake 940 ml  Output 4 ml  Net 936 ml   Filed Weights   09/13/17 0730 09/14/17 0831 09/21/17 0400  Weight: 117.4 kg (258 lb 13.1 oz) 115.8 kg (255 lb 4.7 oz) 111.1 kg (244 lb 14.9 oz)    Examination:  General exam: Appears calm and comfortable Respiratory system: Respiratory effort normal. Skin: well healed incisional scar over cervical spine without erythema. Psych: Flat affect. Alert and oriented   Data Reviewed: I have personally reviewed following labs and imaging studies  CBC: Recent Labs  Lab 11/03/17 0500  WBC 6.9  HGB 12.0  HCT 36.4  MCV 92.2  PLT 601   Basic Metabolic Panel: Recent Labs  Lab 11/03/17 0500  NA 137  K 4.3  CL 101  CO2 29  GLUCOSE 94  BUN 13  CREATININE 0.57  CALCIUM 9.3   GFR: Estimated Creatinine Clearance: 107.4 mL/min (by C-G formula based on SCr of 0.57 mg/dL). Liver Function Tests: No results for input(s): AST, ALT, ALKPHOS, BILITOT, PROT, ALBUMIN in the last 168 hours. No results for input(s): LIPASE, AMYLASE in the last 168 hours. No results for input(s): AMMONIA in the last 168 hours. Coagulation Profile: No results for input(s): INR, PROTIME in the last 168 hours. Cardiac Enzymes: No results for input(s): CKTOTAL, CKMB, CKMBINDEX, TROPONINI in the last 168 hours. BNP (last 3 results) No results for input(s): PROBNP in the last 8760 hours. HbA1C: No results for input(s): HGBA1C  in the last 72 hours. CBG: No results for input(s): GLUCAP in the last 168 hours. Lipid Profile: No results for input(s): CHOL, HDL, LDLCALC, TRIG, CHOLHDL, LDLDIRECT in the last 72 hours. Thyroid Function Tests: No results for input(s): TSH, T4TOTAL, FREET4, T3FREE, THYROIDAB in the last 72 hours. Anemia Panel: No results for input(s): VITAMINB12, FOLATE, FERRITIN, TIBC, IRON, RETICCTPCT in the last 72 hours. Sepsis Labs: No results for input(s): PROCALCITON, LATICACIDVEN in the last 168 hours.  No results found for this or any previous visit (from the past 240 hour(s)).       Radiology Studies: No results found.      Scheduled Meds: . Chlorhexidine Gluconate Cloth  6 each Topical Daily  . [START ON 11/10/2017] cloNIDine  0.1 mg Oral QID   Followed by  . [START ON 11/12/2017] cloNIDine  0.1 mg Oral BH-qamhs   Followed by  . [START ON 11/14/2017] cloNIDine  0.1 mg Oral QAC breakfast  . DULoxetine  60 mg Oral Daily  . enoxaparin (LOVENOX) injection  40 mg Subcutaneous Q24H  . rifampin  300 mg Oral Q12H  . senna-docusate  1 tablet Oral BID  . sodium chloride flush  10-40 mL Intracatheter Q12H   Continuous Infusions: .  ceFAZolin (ANCEF) IV Stopped (11/08/17 0701)     LOS:  28 days     Cordelia Poche, MD [facility] 11/08/2017, 9:40 AM Pager: [phone]  If 7PM-7AM, please contact night-coverage [email or web address] Password [email or web address] 11/08/2017, 9:40 AM

## 2017-11-09 MED ORDER — CLONIDINE HCL 0.1 MG PO TABS
0.1000 mg | ORAL_TABLET | Freq: Every day | ORAL | Status: AC
  Administered 2017-11-13 – 2017-11-14 (×2): 0.1 mg via ORAL
  Filled 2017-11-09 (×2): qty 1

## 2017-11-09 MED ORDER — CLONIDINE HCL 0.1 MG PO TABS
0.1000 mg | ORAL_TABLET | ORAL | Status: AC
  Administered 2017-11-11 – 2017-11-12 (×2): 0.1 mg via ORAL
  Filled 2017-11-09 (×4): qty 1

## 2017-11-09 MED ORDER — CLONIDINE HCL 0.1 MG PO TABS
0.1000 mg | ORAL_TABLET | Freq: Four times a day (QID) | ORAL | Status: AC
  Administered 2017-11-09 – 2017-11-10 (×6): 0.1 mg via ORAL
  Filled 2017-11-09 (×6): qty 1

## 2017-11-09 NOTE — Progress Notes (Signed)
PROGRESS NOTE    [name]  LOV:[id number] DOB: 08/25/1966 DOA: 09/11/2017 PCP: Marliss Coots, NP   Brief Narrative: [name] is a 52 y.o. female with history of PTSD, depression, intravenous drug use, chronic back pain status post previous back surgery, ADD, bilateral ovarian cysts, PID, sciatica presented on 09/11/2017 with severe back and neck pain. MRI of the T and L-spine revealed ventral epidural abscess. She was found to have MSSA bacteremia. She was put on IV antibiotics. ID and neurosurgery were consulted. Patient was transferred to [facility]. She developed urinary retention on 09/15/2017 for which she required emergent C3-C7 laminectomy for decompression of spinal cord and debridement of epidural abscess. She had TEE on 09/20/2017 which was negative for vegetations. ID recommends at least 6 weeks of IV antibiotics and last negative Blood Cx were 09/19/17.   Assessment & Plan:   Principal Problem:   Severe sepsis ([place]) Active Problems:   Hypokalemia   Lymphadenopathy   Back pain   Hyponatremia   Right ovarian cyst   Hypotension   MSSA bacteremia   Abscess in epidural space of thoracic spine   Mediastinal mass   Obesity, Class III, BMI 40-49.9 (morbid obesity) (HCC)   Leucocytosis   Neck pain   Abscess in epidural space of cervical spine   MSSA bacteremia -TEE negative for endocarditis 11/26 -Blood cultures negative from 11/25 -Clarified with ID on 12/27; plan is for Ancef and rifampin for 8 weeks total. Patient will get treatment in the hospital secondary to history of IV drug use.  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 Will need to wean as patient will not be given prescriptions on discharge and she does not have a pain management physician -Continue Oxycodone 5 mg q 8 hours prn. Plan was to decrease frequency again in AM -Ibuprofen 600 mg prn, voltaren gel prn -COWS -Will start clonidine taper today. Will adjust taper for symptoms. -Continue Robaxin and Atarax   Obesity, Class III, BMI 40-49.9 (morbid obesity) ([place]) -Counseled on diet and weight control.,  Nutrition consult  Reactive thrombocytosis -Last seen by Dr. Jana Hakim on 11/18 to rule out lymphoma. Resolved currently   Hepatitis C -Outpatient follow-up with ID/GI once IV antibiotics is complete  Normocytic iron deficiency anemia -H&H stable and baseline, outpatient follow-up with Dr. Jana Hakim  Hyponatremia -Resolved  Elevated alkaline phosphatase No abdominal pain. Resolved. Unknown etiology.   DVT prophylaxis: Heparin Code Status: Full code Family Communication: None at bedside Disposition Plan: Discharge home once IV antibiotics therapy completed   Consultants:   Infectious disease  Hematology  Neurosurgery  Cardiology  Procedures:  Emergent C3-C7 laminectomy for decompression of spinal cord and debridement of epidural abscesson 09/15/2017  TEE on 09/20/2017: No evidence of vegetations  Transthoracic ECHO on 09/12/2017 Study Conclusions  - Left ventricle: The cavity size was normal. Wall thickness was normal. Systolic function was normal. The estimated ejection fraction was in the range of 55% to 60%. Wall motion was normal; there were no regional wall motion abnormalities. Left ventricular diastolic function parameters were normal. - Mitral valve: There was mild regurgitation. - Pulmonary arteries: Systolic pressure was  mildly increased. PA peak pressure: 39 mm Hg  (S).     Antimicrobials:  Cefazolin  Rifampin  Nafcillin    Subjective: Some chills overnight. Afebrile. No diarrhea or vomiting  Objective: Vitals:   11/08/17 2012 11/08/17 2348 11/09/17 0409 11/09/17 0815  BP: 122/88 112/71 112/72   Pulse: 71 70 (!) 57   Resp: 16 16 18    Temp: 98 F (36.7 C) 97.8 F (36.6 C) 98.2 F (36.8 C) 98.5 F (36.9 C)  TempSrc: Oral Oral Oral   SpO2: 100% 99% 99%   Weight:      Height:        Intake/Output Summary (Last 24 hours) at 11/09/2017 1116 Last data filed at 11/09/2017 0700 Gross per 24 hour  Intake 100 ml  Output 1 ml  Net 99 ml   Filed Weights   09/13/17 0730 09/14/17 0831 09/21/17 0400  Weight: 117.4 kg (258 lb 13.1 oz) 115.8 kg (255 lb 4.7 oz) 111.1 kg (244 lb 14.9 oz)    Examination:  General exam: Appears calm and comfortable Respiratory system: Respiratory effort normal. Psych: Flat affect. Alert and oriented   Data Reviewed: I have personally reviewed following labs and imaging studies  CBC: Recent Labs  Lab 11/03/17 0500  WBC 6.9  HGB 12.0  HCT 36.4  MCV 92.2  PLT 096   Basic Metabolic Panel: Recent Labs  Lab 11/03/17 0500  NA 137  K 4.3  CL 101  CO2 29  GLUCOSE 94  BUN 13  CREATININE 0.57  CALCIUM 9.3   GFR: Estimated Creatinine Clearance: 107.4 mL/min (by C-G formula based on SCr of 0.57 mg/dL). Liver Function Tests: No results for input(s): AST, ALT, ALKPHOS, BILITOT, PROT, ALBUMIN in the last 168 hours. No results for input(s): LIPASE, AMYLASE in the last 168 hours. No results for input(s): AMMONIA in the last 168 hours. Coagulation Profile: No results for input(s): INR, PROTIME in the last 168 hours. Cardiac Enzymes: No results for input(s): CKTOTAL, CKMB, CKMBINDEX, TROPONINI in the last 168 hours. BNP (last 3 results) No results for input(s): PROBNP in the last 8760 hours. HbA1C: No results for input(s): HGBA1C in the last 72 hours. CBG: No results for input(s): GLUCAP in the  last 168 hours. Lipid Profile: No results for input(s): CHOL, HDL, LDLCALC, TRIG, CHOLHDL, LDLDIRECT in the last 72 hours. Thyroid Function Tests: No results for input(s): TSH, T4TOTAL, FREET4, T3FREE, THYROIDAB in the last 72 hours. Anemia Panel: No results for input(s): VITAMINB12, FOLATE, FERRITIN, TIBC, IRON, RETICCTPCT in the last 72 hours. Sepsis Labs: No results for input(s): PROCALCITON, LATICACIDVEN in the last 168 hours.  No results found for this or any previous visit (from the past 240 hour(s)).       Radiology Studies: No results found.      Scheduled Meds: . Chlorhexidine Gluconate Cloth  6 each Topical Daily  . cloNIDine  0.1 mg Oral QID   Followed by  . [START ON 11/11/2017] cloNIDine  0.1 mg Oral BH-qamhs   Followed by  . [START ON 11/13/2017] cloNIDine  0.1 mg Oral QAC breakfast  . DULoxetine  60 mg Oral Daily  . enoxaparin (LOVENOX) injection  40 mg Subcutaneous Q24H  . rifampin  300 mg Oral Q12H  . senna-docusate  1 tablet Oral BID  . sodium chloride flush  10-40 mL Intracatheter Q12H   Continuous Infusions: .  ceFAZolin (ANCEF) IV Stopped (11/09/17 0720)     LOS: 59 days     Cordelia Poche,  MD [facility] 11/09/2017, 11:16 AM Pager: [phone]  If 7PM-7AM, please contact night-coverage [email or web address] Password [facility] 11/09/2017, 11:16 AM

## 2017-11-10 LAB — CBC WITH DIFFERENTIAL/PLATELET
BASOS ABS: 0.1 10*3/uL (ref 0.0–0.1)
Basophils Relative: 1 %
Eosinophils Absolute: 0.6 10*3/uL (ref 0.0–0.7)
Eosinophils Relative: 7 %
HEMATOCRIT: 35.5 % — AB (ref 36.0–46.0)
HEMOGLOBIN: 11.9 g/dL — AB (ref 12.0–15.0)
LYMPHS PCT: 40 %
Lymphs Abs: 3.1 10*3/uL (ref 0.7–4.0)
MCH: 30.3 pg (ref 26.0–34.0)
MCHC: 33.5 g/dL (ref 30.0–36.0)
MCV: 90.3 fL (ref 78.0–100.0)
MONO ABS: 0.8 10*3/uL (ref 0.1–1.0)
Monocytes Relative: 10 %
NEUTROS ABS: 3.2 10*3/uL (ref 1.7–7.7)
Neutrophils Relative %: 42 %
Platelets: 345 10*3/uL (ref 150–400)
RBC: 3.93 MIL/uL (ref 3.87–5.11)
RDW: 12.7 % (ref 11.5–15.5)
WBC: 7.6 10*3/uL (ref 4.0–10.5)

## 2017-11-10 LAB — COMPREHENSIVE METABOLIC PANEL
ALT: 30 U/L (ref 14–54)
AST: 42 U/L — AB (ref 15–41)
Albumin: 3.2 g/dL — ABNORMAL LOW (ref 3.5–5.0)
Alkaline Phosphatase: 85 U/L (ref 38–126)
Anion gap: 9 (ref 5–15)
BILIRUBIN TOTAL: 0.5 mg/dL (ref 0.3–1.2)
BUN: 10 mg/dL (ref 6–20)
CALCIUM: 9.1 mg/dL (ref 8.9–10.3)
CO2: 26 mmol/L (ref 22–32)
Chloride: 103 mmol/L (ref 101–111)
Creatinine, Ser: 0.61 mg/dL (ref 0.44–1.00)
GFR calc Af Amer: >60 mL/min (ref >60)
GLUCOSE: 108 mg/dL — AB (ref 65–99)
Potassium: 4.1 mmol/L (ref 3.5–5.1)
Sodium: 138 mmol/L (ref 135–145)
TOTAL PROTEIN: 7.3 g/dL (ref 6.5–8.1)

## 2017-11-10 LAB — MAGNESIUM: MAGNESIUM: 1.9 mg/dL (ref 1.7–2.4)

## 2017-11-10 LAB — PHOSPHORUS: Phosphorus: 4.9 mg/dL — ABNORMAL HIGH (ref 2.5–4.6)

## 2017-11-10 MED ORDER — OXYCODONE HCL 5 MG PO TABS
5.0000 mg | ORAL_TABLET | Freq: Two times a day (BID) | ORAL | Status: AC | PRN
  Administered 2017-11-11 (×2): 5 mg via ORAL
  Filled 2017-11-10 (×2): qty 1

## 2017-11-10 NOTE — Progress Notes (Signed)
PROGRESS NOTE    [name]  BTD:[id number] DOB: 1966/08/09 DOA: 09/11/2017 PCP: Marliss Coots, NP   Brief Narrative: [name] is a 52 y.o. female with history of PTSD, depression, intravenous drug use, chronic back pain status post previous back surgery, ADD, bilateral ovarian cysts, PID, sciatica presented on 09/11/2017 with severe back and neck pain. MRI of the T and L-spine revealed ventral epidural abscess and she was also found to have MSSA bacteremia. She was put on IV antibiotics. ID and neurosurgery were consulted. Patient was transferred to [facility]. She developed urinary retention on 09/15/2017 for which she required emergent C3-C7 laminectomy for decompression of spinal cord and debridement of epidural abscess. She had TEE on 09/20/2017 which was negative for vegetations. ID recommends at least 6 weeks of IV antibiotics and last negative Blood Cx were 09/19/17. IV Abx will be continued through 11/13/16.   Assessment & Plan:   Principal Problem:   Severe sepsis ([place]) Active Problems:   Hypokalemia   Lymphadenopathy   Back pain   Hyponatremia   Right ovarian cyst   Hypotension   MSSA bacteremia   Abscess in epidural space of thoracic spine   Mediastinal mass   Obesity, Class III, BMI 40-49.9 (morbid obesity) (HCC)   Leucocytosis   Neck pain   Abscess in epidural space of cervical spine  MSSA Bacteremia -TEE negative for endocarditis 11/26 -Blood cultures negative from 11/25 -Clarified with ID on 12/27; plan is for Ancef and rifampin for 8 weeks total. Patient will get treatment in the hospital secondary to history of IV drug use. -Discussed with Dr. 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 Scharlene Gloss today and recommending po Doxycycline for 30 days after treatment with IV Abx as above.   History of Polysubstance Abuse/ IV Drug Use -Cannot be discharged with IV access or PICC line, hence will remain inpatient while on IV antibiotics -Teaching Service Dr. Darvin Neighbours for evaluation for Suboxone   Chronic Pain Syndrome -Currently Stable. Will need to wean as patient will not be given prescriptions on discharge and she does not have a pain management physician -Continue Duloxetine 60 mg po Daily, Methocarbamol 750 mg po q8hprn,  -Bowel Regimen with Senna-Docusate 1 tab po BID and Miralax 17 grams po Dailyprn  -Avoid IV Narcotics  -Continue Oxycodone 5 mg q 8 hours prn. Plan was to decrease frequency again in AM and will change to 5 mg po q12hprn -C/w Ibuprofen 600 mg q6hprn and with Diclofenac Sodium 2 grams Topically 4 times a day PRN  -COWS Protocol  -Started on Clonidine Taper. Will adjust taper for symptoms. C/w Taper as Ordered; Patient to receive 0.1 mg po 4 times daily today and change to 0.1 mg po BH-q AM and HS x 2 days starting tomorrow -Asked Dr. Loney Laurence of [facility] Clinic to evaluate if patient is a candidate for outpatient Suboxone   Obesity, Class III, BMI 40-49.9 (morbid obesity) ([place]) -Patient counseled on diet and weight control  -Nutrition consult  Reactive Thrombocytosis -Last seen by Dr. Jana Hakim on 11/18 to r/o Lymphoma  -Platelet Count improved went from 972 -> 345 -  Repeat Blood work Intermittently   Hepatitis C -AST (42) and ALT (30) wnL -Hep C Genotype was 1a and HCV Quantitative was 2,240,000 and HCV Quantitave Log was 6.350 -Outpatient follow-up with ID once IV Abx Infection is complete   Normocytic Iron Deficiency Anemia -Iron Level was 24, UIBC 208, TIBC 232, Saturation Ratios 10, Ferritin 193, and Folate 12.5; Vitamin  B12 was 679 -Stable. Hb/Hct now 11.9/35.5 -Continue to Monitor for S/Sx of Bleeding -Repeat CBC intermittently  Hyponatremia -Improved. Na+ now 138 -Continue to Monitor intermittently   Elevated Alkaline Phosphatase -No abdominal pain.  -Unknown etiology. -Resolved as Alk Phos now 85  DVT prophylaxis: Enoxaparin 40 mg sq q24h Code Status: FULL CODE Family Communication: No family present at Bedside  Disposition Plan: Anticipate D/C Home when IV Therapies are completed    Consultants:   Infectious Diseases  Hematology  Neurosurgery  Cardiology  Teaching Service Dr. Loney Laurence for evaluation for Suboxone Clinic    Procedures:  Emergent C3-C7 laminectomy for decompression of spinal cord and debridement of epidural abscesson 09/15/2017  TEE on 09/20/2017: No evidence of vegetations  Transthoracic ECHO on 09/12/2017 Study Conclusions  - Left ventricle: The cavity size was normal. Wall thickness was normal. Systolic function was normal. The estimated ejection fraction was in the range of 55% to 60%. Wall motion was normal; there were no regional wall motion abnormalities. Left ventricular diastolic function parameters were normal. - Mitral valve: There was mild regurgitation. - Pulmonary arteries: Systolic pressure was mildly increased. PA peak pressure: 39 mm Hg (S).   Antimicrobials:  Anti-infectives (From admission, onward)   Start     Dose/Rate Route Frequency Ordered Stop   09/27/17 1800  ceFAZolin (ANCEF) IVPB 2g/100 mL premix     2 g 200 mL/hr over 30 Minutes Intravenous Every 8 hours 09/27/17 1622 11/13/17 2359   09/16/17 2300  nafcillin 2 g in dextrose 5 % 100 mL IVPB  Status:  Discontinued     2 g 200 mL/hr over 30 Minutes Intravenous Every 4 hours 09/15/17 2035 09/15/17 2303   09/16/17 1400  rifampin (RIFADIN) capsule 300 mg     300 mg Oral Every 12 hours 09/16/17 1344     09/15/17 2315  nafcillin 2 g in dextrose 5 % 100 mL IVPB  Status:   Discontinued     2 g 200 mL/hr over 30 Minutes Intravenous Every 4 hours 09/15/17 2303 09/27/17 1622   09/15/17 1237  bacitracin 50,000 Units in sodium chloride irrigation 0.9 % 500 mL irrigation  Status:  Discontinued       As needed 09/15/17 1237 09/15/17 1433   09/13/17 1200  nafcillin 2 g in dextrose 5 % 100 mL IVPB  Status:  Discontinued     2 g 200 mL/hr over 30 Minutes Intravenous Every 4 hours 09/13/17 0918 09/15/17 2035   09/12/17 1400  ceFAZolin (ANCEF) IVPB 2g/100 mL premix  Status:  Discontinued     2 g 200 mL/hr over 30 Minutes Intravenous Every 8 hours 09/12/17 0848 09/13/17 0918   09/12/17 1000  vancomycin (VANCOCIN) 1,250 mg in sodium chloride 0.9 % 250 mL IVPB  Status:  Discontinued     1,250 mg 166.7 mL/hr over 90 Minutes Intravenous Every 12 hours 09/11/17 1904 09/12/17 0828   09/12/17 0600  ceFEPIme (MAXIPIME) 1 g in dextrose 5 % 50 mL IVPB  Status:  Discontinued     1 g 100 mL/hr over 30 Minutes Intravenous Every 8 hours 09/11/17 1904 09/12/17 0827  09/11/17 1830  vancomycin (VANCOCIN) 2,000 mg in sodium chloride 0.9 % 500 mL IVPB     2,000 mg 250 mL/hr over 120 Minutes Intravenous  Once 09/11/17 1758 09/12/17 0026   09/11/17 1815  ceFEPIme (MAXIPIME) 2 g in dextrose 5 % 50 mL IVPB     2 g 100 mL/hr over 30 Minutes Intravenous  Once 09/11/17 1809 09/11/17 2208   09/11/17 1800  piperacillin-tazobactam (ZOSYN) IVPB 3.375 g  Status:  Discontinued     3.375 g 12.5 mL/hr over 240 Minutes Intravenous Every 8 hours 09/11/17 1757 09/11/17 1808     Subjective: Seen and examined at bedside and pain was uncontrolled now that she was being tapered off. States she has been ambulating without issue. No CP or SOB. No other concerns or complaints at this time.   Objective: Vitals:   11/09/17 1900 11/09/17 2341 11/10/17 0317 11/10/17 0758  BP: 127/69 100/61 (!) 88/66 (!) 87/61  Pulse: 67 61 (!) 57   Resp: _0 Temp: 98.7 F (37.1 C) 97.8 F (36.6 C) 98 F (36.7 C)  97.7 F (36.5 C)  TempSrc: Oral Oral Oral Oral  SpO2: 100% 100% 100%   Weight:      Height:        Intake/Output Summary (Last 24 hours) at 11/10/2017 0824 Last data filed at 11/09/2017 2300 Gross per 24 hour  Intake 940 ml  Output 3 ml  Net 937 ml   Filed Weights   09/13/17 0730 09/14/17 0831 09/21/17 0400  Weight: 117.4 kg (258 lb 13.1 oz) 115.8 kg (255 lb 4.7 oz) 111.1 kg (244 lb 14.9 oz)   Examination: Physical Exam:  Constitutional: WN/WD obese Caucasian female in NAD and appears calm and comfortable Eyes: Lids and conjunctivae normal, sclerae anicteric  ENMT: External Ears, Nose appear normal. Grossly normal hearing. Mucous membranes are moist.  Neck: Appears normal, supple, no cervical masses, normal ROM, no appreciable thyromegaly, no JVD Respiratory: Clear to auscultation bilaterally, no wheezing, rales, rhonchi or crackles. Normal respiratory effort and patient is not tachypenic. No accessory muscle use.  Cardiovascular: RRR, no murmurs / rubs / gallops. S1 and S2 auscultated. No extremity edema.  Abdomen: Soft, non-tender, non-distended. No masses palpated. No appreciable hepatosplenomegaly. Bowel sounds positive x4.  GU: Deferred. Musculoskeletal: No clubbing / cyanosis of digits/nails. No joint deformity upper and lower extremities. Has Right Arm PICC in place Skin: No rashes, lesions, ulcers on a limited skin eval. No induration; Warm and dry.  Neurologic: CN 2-12 grossly intact with no focal deficits.  Strength 5/5 in all 4. Romberg sign cerebellar reflexes not assessed.  Psychiatric: Normal judgment and insight. Alert and oriented x 3. Normal mood and appropriate affect.   Data Reviewed: I have personally reviewed following labs and imaging studies  CBC: No results for input(s): WBC, NEUTROABS, HGB, HCT, MCV, PLT in the last 168 hours. Basic Metabolic Panel: No results for input(s): NA, K, CL, CO2, GLUCOSE, BUN, CREATININE, CALCIUM, MG, PHOS in the last 168  hours. GFR: Estimated Creatinine Clearance: 107.4 mL/min (by C-G formula based on SCr of 0.57 mg/dL). Liver Function Tests: No results for input(s): AST, ALT, ALKPHOS, BILITOT, PROT, ALBUMIN in the last 168 hours. No results for input(s): LIPASE, AMYLASE in the last 168 hours. No results for input(s): AMMONIA in the last 168 hours. Coagulation Profile: No results for input(s): INR, PROTIME in the last 168 hours. Cardiac Enzymes: No results for input(s): CKTOTAL, CKMB, CKMBINDEX, TROPONINI in the last  168 hours. BNP (last 3 results) No results for input(s): PROBNP in the last 8760 hours. HbA1C: No results for input(s): HGBA1C in the last 72 hours. CBG: No results for input(s): GLUCAP in the last 168 hours. Lipid Profile: No results for input(s): CHOL, HDL, LDLCALC, TRIG, CHOLHDL, LDLDIRECT in the last 72 hours. Thyroid Function Tests: No results for input(s): TSH, T4TOTAL, FREET4, T3FREE, THYROIDAB in the last 72 hours. Anemia Panel: No results for input(s): VITAMINB12, FOLATE, FERRITIN, TIBC, IRON, RETICCTPCT in the last 72 hours. Sepsis Labs: No results for input(s): PROCALCITON, LATICACIDVEN in the last 168 hours.  No results found for this or any previous visit (from the past 240 hour(s)).   Radiology Studies: No results found.   Scheduled Meds: . Chlorhexidine Gluconate Cloth  6 each Topical Daily  . cloNIDine  0.1 mg Oral QID   Followed by  . [START ON 11/11/2017] cloNIDine  0.1 mg Oral BH-qamhs   Followed by  . [START ON 11/13/2017] cloNIDine  0.1 mg Oral QAC breakfast  . DULoxetine  60 mg Oral Daily  . enoxaparin (LOVENOX) injection  40 mg Subcutaneous Q24H  . rifampin  300 mg Oral Q12H  . senna-docusate  1 tablet Oral BID  . sodium chloride flush  10-40 mL Intracatheter Q12H   Continuous Infusions: .  ceFAZolin (ANCEF) IV Stopped (11/10/17 0716)    LOS: 60 days   Kerney Elbe, DO [facility] Pager [phone]  If 7PM-7AM, please contact  night-coverage [email or web address] Password [facility] 11/10/2017, 8:24 AM

## 2017-11-11 MED ORDER — BUPRENORPHINE HCL-NALOXONE HCL 8-2 MG SL SUBL
1.0000 | SUBLINGUAL_TABLET | Freq: Every day | SUBLINGUAL | Status: DC
  Administered 2017-11-12 – 2017-11-14 (×3): 1 via SUBLINGUAL
  Filled 2017-11-11 (×3): qty 1

## 2017-11-11 MED ORDER — BUPRENORPHINE HCL-NALOXONE HCL 8-2 MG SL SUBL: 1.0000 | SUBLINGUAL_TABLET | Freq: Two times a day (BID) | SUBLINGUAL | 0 refills | Status: DC

## 2017-11-11 NOTE — Progress Notes (Addendum)
Internal medicine Consult Note   Date: 11/11/2017  Patient name: [name]  Medical record number: [id number]  Date of birth: 1966/06/22   I saw Ms. Duchesne today at the request of [facility] Dr. Alfredia Ferguson for evaluation for suboxone therapy.  Ms. Waiters is a 52yo woman with PMH of PTSD, depression/anxiety, IVDU including heroin and crystal methamphetamine, ADD, chronic back pain s/p surgery who presented with severe back and neck pain which was revealed to be an epidural abscess.  She is finishing up a 6 week course of Abx for MSSA.  ID and neurosurgery have been following her.  Her last day of Abx is 1/19 (Saturday).  As pertains to her drug use, she has been using IV heroin on and off for about 1 year.  She notes that she has been using oral opiates on an off since 2003, usually starting around the time of a surgery and then weaning herself off.  In December of 2017, she had a bad break up and then was offered heroin in January of 2018 and that is when her IV drug use started.  She reports not being a daily user, but using enough that she has felt significant withdrawal.  Her severe withdrawal results in nausea, vomiting and diarrhea.  She would very much like to avoid withdrawal going forward and is scared to go through that again.  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  She is not insured.  Since being in the hospital, she has been maintained on short acting oxycodone with a wean in place.  Today, she is only supposed to have oxycodone 5mg  BID, last dose at around 3am this morning.  She has had 2 overdoses in the past and is now admitted for an infectious complication of her drug use.    She has a significant mental health history.  She has PTSD, previously with flashbacks, but she has been in counseling for this and no longer has flashbacks.  She was on Zoloft for a prolonged period of time, but has been off of this since the summer and denies SI or  severe depressive symptoms.  She has also been on adderall for ADD in the past, but no longer feels she needs this.  She is very positive about "getting clean" as her mother died of an intentional overdose, and she does not want to die that way.  She has used suboxone in the past to keep out of withdrawal (obtained from a friend) and feels it works for her.  She, however, does not like the taste.    For her pain, she feels that ibuprofen and muscle relaxers are working.  She understands the need to wean her oxycodone.  She would like to start suboxone tomorrow morning if possible.    On exam, she is resting comfortably in bed.  She has no acute distress, no diaphoresis.  She has somewhat pressured speech.  She is breathing comfortably.  She has a PICC line in place in the left upper arm.    On review of blood work - HCV Ab was + and confirmed with quant HCV.  HIV was NR.  She will need follow up with ID as an outpatient to address this and her MSSA bacteremia.  Would confirm plan for outpatient Abx for patient with ID prior to discharge.  LFTs show AST of 42 (ULN was 41) otherwise has been normal during this hospitalization.    Assessment: 51yo  woman with moderate Opiate Use Disorder.  She would be a good candidate for suboxone therapy.  She is very motivated to be off of short acting narcotics and eventually completely off of any substances based on our conversation.    Plan - Check COWS BID - Last dose of short acting oxycodone this evening - no further narcotics - Start Buprenorphine-naloxone 8mg -2mg  tomorrow morning, plan for Q daily dosing tomorrow depending on COWS score, followed by 8mg -2mg  BID on Saturday.  - Can continue clonidine taper which has been started - Will discuss with my partners and clinic a day for her to be seen (she is not able to be seen on Tuesday due to court proceedings) - Provide outpatient script for buprenorphine with coupon - Follow up in [place],  Jheremy Boger B, MD 1/17/201910:38 AM

## 2017-11-11 NOTE — Progress Notes (Signed)
PROGRESS NOTE    [name]  ZJQ:[id number] DOB: 1966-09-03 DOA: 09/11/2017 PCP: Marliss Coots, NP   Brief Narrative: [name] is a 52 y.o. female with history of PTSD, depression, intravenous drug use, chronic back pain status post previous back surgery, ADD, bilateral ovarian cysts, PID, sciatica presented on 09/11/2017 with severe back and neck pain. MRI of the T and L-spine revealed ventral epidural abscess and she was also found to have MSSA bacteremia. She was put on IV antibiotics. ID and neurosurgery were consulted. Patient was transferred to [facility]. She developed urinary retention on 09/15/2017 for which she required emergent C3-C7 laminectomy for decompression of spinal cord and debridement of epidural abscess. She had TEE on 09/20/2017 which was negative for vegetations. ID recommends at least 6 weeks of IV antibiotics and last negative Blood Cx were 09/19/17. IV Abx will be continued through 11/13/16. Dr. Loney Laurence of [facility] evaluated and will start patient on Buprenorphine-Naloxone in AM as she is a good candidate for Suboxone Therapy.   Assessment & Plan:   Principal Problem:   Severe sepsis ([place]) Active Problems:   Hypokalemia   Lymphadenopathy   Back pain   Hyponatremia   Right ovarian cyst   Hypotension   MSSA bacteremia   Abscess in epidural space of thoracic spine   Mediastinal mass   Obesity, Class III, BMI 40-49.9 (morbid obesity) (HCC)   Leucocytosis   Neck pain   Abscess in epidural space of cervical spine  MSSA Bacteremia -TEE negative for endocarditis 11/26 -Blood cultures negative from 11/25 -Clarified with ID on 12/27; plan is for Ancef and rifampin for 8 weeks total. Patient will get treatment in the hospital secondary to history of IV drug use. -Discussed with Dr. 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 Scharlene Gloss today and recommending po Doxycycline for 30 days after treatment with IV Abx as above.   History of Polysubstance Abuse/ IV Drug Use -Cannot be discharged with IV access or PICC line, hence will remain inpatient while on IV antibiotics -Teaching Service Dr. Darvin Neighbours for evaluation for Suboxone; See Below   Chronic Pain Syndrome -Currently Stable. Will need to wean as patient will not be given prescriptions on discharge and she does not have a pain management physician -Continue Duloxetine 60 mg po Daily, Methocarbamol 750 mg po q8hprn,  -Bowel Regimen with Senna-Docusate 1 tab po BID and Miralax 17 grams po Dailyprn  -Avoid IV Narcotics  -D/C'd Oxycodone 5 mg q12hprn tonight as patient will be transitioned to Buprenorphine-Naloxone 8 mg-19m in AM and plan for daily dosing based on COWS Score -C/w Ibuprofen 600 mg q6hprn and with Diclofenac Sodium 2 grams Topically 4 times a day PRN  -COWS Protocol BID  -Started on Clonidine Taper. Will adjust taper for symptoms. C/w Taper as Ordered; Patient to receive 0.1 mg po BH-q AM and HS x 2 days today and then start 0.1 mg po Daily x 2 doses  -Dr. MLoney Laurenceevaluated and feels patient is a good candidate for Suboxone and will start in AM; Patient will be provided outpatient script for  Buprenorphine with Coupon and will need to follow up in the [facility] on 11/17/17 at 1315pm.   Obesity, Class III, BMI 40-49.9 (Morbid Obesity) ([place]) -Patient counseled on diet and weight control  -Nutrition consult appreciated.   Reactive Thrombocytosis -Last seen by Dr. Jana Hakim on 11/18 to r/o Lymphoma  -Platelet Count improved went from 972 -> 345  (yesterday) -Repeat Blood work Intermittently   Hepatitis C -AST (42) and ALT (30) wnL done yesterday  -Hep C Genotype was 1a and HCV Quantitative was 2,240,000 and HCV Quantitave Log was 6.350 -Outpatient follow-up with ID once IV Abx Infection is complete   Normocytic Iron Deficiency Anemia -Iron Level was 24, UIBC 208, TIBC 232, Saturation Ratios 10, Ferritin 193, and Folate 12.5; Vitamin B12 was 679 -Stable. Hb/Hct now 11.9/35.5 yesterday  -Continue to Monitor for S/Sx of Bleeding -Repeat CBC intermittently  Hyponatremia -Improved. Na+ now 138 yesterday  -Continue to Monitor intermittently   Elevated Alkaline Phosphatase -No abdominal pain.  -Unknown etiology. -Resolved as Alk Phos now 85  Severe Sepsis in the setting of MSSA Bacteremia and Abscesses in the Cervical and Thoracic Spine, Improved -Sepsis physiology currently resolved -WBC was 7.6 yesterday  -Repeat CBC intermittently  -Patient is Hemodynamically stable   DVT prophylaxis: Enoxaparin 40 mg sq q24h Code Status: FULL CODE Family Communication: No family present at Bedside  Disposition Plan: Anticipate D/C Home when IV Therapies are completed   Consultants:   Infectious Diseases  Hematology  Neurosurgery  Cardiology  Teaching Service Dr. Loney Laurence for evaluation for Suboxone Clinic    Procedures:  Emergent C3-C7 laminectomy for decompression of spinal cord and debridement of epidural abscesson 09/15/2017  TEE on 09/20/2017: No evidence of vegetations  Transthoracic ECHO on 09/12/2017 Study Conclusions  - Left ventricle: The cavity size was normal. Wall thickness was normal. Systolic function was normal. The estimated ejection fraction was in the range of 55% to 60%. Wall motion was normal; there were no regional wall motion abnormalities. Left ventricular diastolic function parameters were normal. - Mitral valve: There was mild regurgitation. - Pulmonary arteries: Systolic pressure  was mildly increased. PA peak pressure: 39 mm Hg (S).   Antimicrobials:  Anti-infectives (From admission, onward)   Start     Dose/Rate Route Frequency Ordered Stop   09/27/17 1800  ceFAZolin (ANCEF) IVPB 2g/100 mL premix     2 g 200 mL/hr over 30 Minutes Intravenous Every 8 hours 09/27/17 1622 11/13/17 2359   09/16/17 2300  nafcillin 2 g in dextrose 5 % 100 mL IVPB  Status:  Discontinued     2 g 200 mL/hr over 30 Minutes Intravenous Every 4 hours 09/15/17 2035 09/15/17 2303   09/16/17 1400  rifampin (RIFADIN) capsule 300 mg     300 mg Oral Every 12 hours 09/16/17 1344     09/15/17 2315  nafcillin 2 g in dextrose 5 % 100 mL IVPB  Status:  Discontinued     2 g 200 mL/hr over 30 Minutes Intravenous Every 4 hours 09/15/17 2303 09/27/17 1622   09/15/17 1237  bacitracin 50,000 Units in sodium chloride irrigation 0.9 % 500 mL irrigation  Status:  Discontinued       As needed 09/15/17 1237 09/15/17 1433   09/13/17 1200  nafcillin 2 g in dextrose 5 % 100 mL IVPB  Status:  Discontinued     2 g 200 mL/hr over 30 Minutes Intravenous Every 4 hours 09/13/17 0918 09/15/17 2035   09/12/17 1400  ceFAZolin (ANCEF) IVPB  2g/100 mL premix  Status:  Discontinued     2 g 200 mL/hr over 30 Minutes Intravenous Every 8 hours 09/12/17 0848 09/13/17 0918   09/12/17 1000  vancomycin (VANCOCIN) 1,250 mg in sodium chloride 0.9 % 250 mL IVPB  Status:  Discontinued     1,250 mg 166.7 mL/hr over 90 Minutes Intravenous Every 12 hours 09/11/17 1904 09/12/17 0828   09/12/17 0600  ceFEPIme (MAXIPIME) 1 g in dextrose 5 % 50 mL IVPB  Status:  Discontinued     1 g 100 mL/hr over 30 Minutes Intravenous Every 8 hours 09/11/17 1904 09/12/17 0827   09/11/17 1830  vancomycin (VANCOCIN) 2,000 mg in sodium chloride 0.9 % 500 mL IVPB     2,000 mg 250 mL/hr over 120 Minutes Intravenous  Once 09/11/17 1758 09/12/17 0026   09/11/17 1815  ceFEPIme (MAXIPIME) 2 g in dextrose 5 % 50 mL IVPB     2 g 100 mL/hr over 30 Minutes  Intravenous  Once 09/11/17 1809 09/11/17 2208   09/11/17 1800  piperacillin-tazobactam (ZOSYN) IVPB 3.375 g  Status:  Discontinued     3.375 g 12.5 mL/hr over 240 Minutes Intravenous Every 8 hours 09/11/17 1757 09/11/17 1808     Subjective: Seen and examined at bedside and stated she did not sleep very well last night. No CP or SOB.   Objective: Vitals:   11/10/17 1943 11/10/17 2328 11/11/17 0355 11/11/17 0720  BP: (!) 104/59 94/82 100/63 (!) 96/55  Pulse: 78 60 (!) 59   Resp:  17 17   Temp: 98.4 F (36.9 C) 97.9 F (36.6 C) 97.9 F (36.6 C) 97.8 F (36.6 C)  TempSrc: Oral Oral Oral Oral  SpO2: 100% 100% 100%   Weight:      Height:        Intake/Output Summary (Last 24 hours) at 11/11/2017 0810 Last data filed at 11/10/2017 1547 Gross per 24 hour  Intake 340 ml  Output -  Net 340 ml   Filed Weights   09/13/17 0730 09/14/17 0831 09/21/17 0400  Weight: 117.4 kg (258 lb 13.1 oz) 115.8 kg (255 lb 4.7 oz) 111.1 kg (244 lb 14.9 oz)   Examination: Physical Exam:  Constitutional: WN/WD obese Caucasian female in NAD appears calm and comfortable  Eyes: Sclerae anicteric. Lids normal ENMT: External Ears and Nose appear normal. MMM Neck: Supple with no JVD Respiratory: CTAB; Unlabored breathing Cardiovascular: RRR; No appreciable m/r/g. No LE Edema Abdomen: Soft, NT, Distended due to body habitus. Bowel sounds present  GU: Deferred Musculoskeletal: No contractures; No cyanosis. Has a police ankle bracelet on Left Leg Skin: No appreciable rashes or lesions on a limited skin eval. Warm and dry Neurologic: CN 2-12 grossly intact.  Psychiatric: Normal mood and affect. Intact judgement and insight  Data Reviewed: I have personally reviewed following labs and imaging studies  CBC: Recent Labs  Lab 11/10/17 0829  WBC 7.6  NEUTROABS 3.2  HGB 11.9*  HCT 35.5*  MCV 90.3  PLT 673   Basic Metabolic Panel: Recent Labs  Lab 11/10/17 0829  NA 138  K 4.1  CL 103  CO2 26   GLUCOSE 108*  BUN 10  CREATININE 0.61  CALCIUM 9.1  MG 1.9  PHOS 4.9*   GFR: Estimated Creatinine Clearance: 107.4 mL/min (by C-G formula based on SCr of 0.61 mg/dL). Liver Function Tests: Recent Labs  Lab 11/10/17 0829  AST 42*  ALT 30  ALKPHOS 85  BILITOT 0.5  PROT 7.3  ALBUMIN  3.2*   No results for input(s): LIPASE, AMYLASE in the last 168 hours. No results for input(s): AMMONIA in the last 168 hours. Coagulation Profile: No results for input(s): INR, PROTIME in the last 168 hours. Cardiac Enzymes: No results for input(s): CKTOTAL, CKMB, CKMBINDEX, TROPONINI in the last 168 hours. BNP (last 3 results) No results for input(s): PROBNP in the last 8760 hours. HbA1C: No results for input(s): HGBA1C in the last 72 hours. CBG: No results for input(s): GLUCAP in the last 168 hours. Lipid Profile: No results for input(s): CHOL, HDL, LDLCALC, TRIG, CHOLHDL, LDLDIRECT in the last 72 hours. Thyroid Function Tests: No results for input(s): TSH, T4TOTAL, FREET4, T3FREE, THYROIDAB in the last 72 hours. Anemia Panel: No results for input(s): VITAMINB12, FOLATE, FERRITIN, TIBC, IRON, RETICCTPCT in the last 72 hours. Sepsis Labs: No results for input(s): PROCALCITON, LATICACIDVEN in the last 168 hours.  No results found for this or any previous visit (from the past 240 hour(s)).   Radiology Studies: No results found.   Scheduled Meds: . Chlorhexidine Gluconate Cloth  6 each Topical Daily  . cloNIDine  0.1 mg Oral QID   Followed by  . cloNIDine  0.1 mg Oral BH-qamhs   Followed by  . [START ON 11/13/2017] cloNIDine  0.1 mg Oral QAC breakfast  . DULoxetine  60 mg Oral Daily  . enoxaparin (LOVENOX) injection  40 mg Subcutaneous Q24H  . rifampin  300 mg Oral Q12H  . senna-docusate  1 tablet Oral BID  . sodium chloride flush  10-40 mL Intracatheter Q12H   Continuous Infusions: .  ceFAZolin (ANCEF) IV 2 g (11/11/17 0546)    LOS: 84 days   Kerney Elbe, DO [facility] Pager [phone]  If 7PM-7AM, please contact night-coverage [email or web address] Password [email or web address] 11/11/2017, 8:10 AM

## 2017-11-11 NOTE — Progress Notes (Signed)
Brief Update:   Patient is scheduled to be seen in our OUD clinic in the [facility] on 1/23 at 1315pm.   I have left a Rx for suboxone for discharge in her shadow chart with a discount coupon for her as anticipated discharge will be on the weekend.  I have also provided follow up information for [name] in her discharge instructions.    Please contact one of my partners, Dr. Evette Doffing or Dr. Joni Reining on 1/18 if there are any issues or concerns.   Thank you for allowing [place] to care for this patient.   Gilles Chiquito, MD

## 2017-11-11 NOTE — Discharge Instructions (Signed)
Instruction for continuing buprenorphine-naloxone (Suboxone) at home  You should not mix buprenorphine-naloxone with other drugs especially large amounts of alcohol or benzodiazepines (Valium, Klonopin, Xanax, Ativan). If you have taken any of these medication, please tell your healthcare team and do not take buprenorphine-naloxone.   If you have any questions or concerns at any time call your healthcare team. Clinic Number:  [place]: [phone] After Hours Number (for suboxone questions only) [phone] - - Leave your number and expect a call back from a physician.

## 2017-11-12 MED ORDER — BUPRENORPHINE HCL-NALOXONE HCL 8-2 MG SL SUBL
1.0000 | SUBLINGUAL_TABLET | Freq: Once | SUBLINGUAL | Status: AC
Start: 2017-11-12 — End: 2017-11-12
  Administered 2017-11-12: 1 via SUBLINGUAL
  Filled 2017-11-12: qty 1

## 2017-11-12 NOTE — Progress Notes (Signed)
PROGRESS NOTE    [name]  ZOX:[id number] DOB: 04/23/1966 DOA: 09/11/2017 PCP: Marliss Coots, NP   Brief Narrative: [name] is a 52 y.o. female with history of PTSD, depression, intravenous drug use, chronic back pain status post previous back surgery, ADD, bilateral ovarian cysts, PID, sciatica presented on 09/11/2017 with severe back and neck pain. MRI of the T and L-spine revealed ventral epidural abscess and she was also found to have MSSA bacteremia. She was put on IV antibiotics. ID and neurosurgery were consulted. Patient was transferred to [facility]. She developed urinary retention on 09/15/2017 for which she required emergent C3-C7 laminectomy for decompression of spinal cord and debridement of epidural abscess. She had TEE on 09/20/2017 which was negative for vegetations. ID recommends at least 6 weeks of IV antibiotics and last negative Blood Cx were 09/19/17. IV Abx will be continued through 11/13/16. Dr. Loney Laurence of [facility] evaluated and will start patient on Buprenorphine-Naloxone in AM as she is a good candidate for Suboxone Therapy.  Assessment & Plan:   Principal Problem:   Severe sepsis ([place]) Active Problems:   Hypokalemia   Lymphadenopathy   Back pain   Hyponatremia   Right ovarian cyst   Hypotension   MSSA bacteremia   Abscess in epidural space of thoracic spine   Mediastinal mass   Obesity, Class III, BMI 40-49.9 (morbid obesity) (HCC)   Leucocytosis   Neck pain   Abscess in epidural space of cervical spine  MSSA Bacteremia -TEE negative for endocarditis 11/26 -Blood cultures negative from 11/25 -Clarified with ID on 12/27; plan is for Ancef and rifampin for 8 weeks total. Patient will get treatment in the hospital secondary to history of IV drug use. -Discussed with Dr. 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 Scharlene Gloss and recommended po Doxycycline for 30 days after treatment with IV Abx as above.   History of Polysubstance Abuse/ IV Drug Use -Cannot be discharged with IV access or PICC line, hence will remain inpatient while on IV antibiotics -Teaching Service Dr. Darvin Neighbours for evaluation for Suboxone; See Below   Chronic Pain Syndrome -Continue Duloxetine 60 mg po Daily, Methocarbamol 750 mg po q8hprn,  -Bowel Regimen with Senna-Docusate 1 tab po BID and Miralax 17 grams po Dailyprn  -Avoid IV Narcotics  -D/C'd Oxycodone 5 mg q12hprn yesterday as patient was transitioned to Buprenorphine-Naloxone 8 mg-'2mg'$  in AM and plan for daily dosing based on COWS Score -C/w Ibuprofen 600 mg q6hprn and with Diclofenac Sodium 2 grams Topically 4 times a day PRN  -COWS Protocol BID  -Started on Clonidine Taper. Will adjust taper for symptoms. C/w Taper as Ordered; Patient to receive 0.1 mg po BH-q AM and HS x today and then start 0.1 mg po Daily x 2 doses tomorrow -Dr. Loney Laurence evaluated and feels patient is a good candidate for Suboxone and will start in AM; Patient will be provided outpatient script for Buprenorphine with Coupon and will need to follow up in the [facility] on 11/17/17 at 1315pm.  -Patient was given Suboxone but unfortunately was not administered  SL so patient was re-dosed this AM  Obesity, Class III, BMI 40-49.9 (Morbid Obesity) ([place]) -Patient counseled on diet and weight control  -Nutrition consult appreciated.   Reactive Thrombocytosis -Last seen by Dr. Jana Hakim on 11/18 to r/o Lymphoma  -Platelet Count improved went from 972 -> 345 on 11/10/17 -Repeat Blood work in AM  Hepatitis C -AST (42)  and ALT (30) wnL done 11/10/17 -Hep C Genotype was 1a and HCV Quantitative was 2,240,000 and HCV Quantitave Log was 6.350 -Repeat CMP in AM -Outpatient follow-up with ID once IV Abx Infection is complete   Normocytic Iron Deficiency Anemia -Iron Level was 24, UIBC 208, TIBC 232, Saturation Ratios 10, Ferritin 193, and Folate 12.5; Vitamin B12 was 679 -Stable. Hb/Hct now 11.9/35.5 on 11/10/17 -Continue to Monitor for S/Sx of Bleeding -Repeat CBC in AM  Hyponatremia -Improved. Na+ now 138 on 11/10/17 -Continue to Monitor intermittently and repeat CMP in AM   Elevated Alkaline Phosphatase -No abdominal pain.  -Unknown etiology. -Resolved as Alk Phos was 85 on 11/10/17 -Repeat CMP in AM  Severe Sepsis in the setting of MSSA Bacteremia and Abscesses in the Cervical and Thoracic Spine, Improved -Sepsis physiology currently resolved -WBC was 7.6 on 11/10/17 -Repeat CBC intermittently and repeat in AM   -Patient is Hemodynamically stable   DVT prophylaxis: Enoxaparin 40 mg sq q24h Code Status: FULL CODE Family Communication: No family present at Bedside  Disposition Plan: Anticipate D/C Home when IV Therapies are completed on 11/14/17  Consultants:   Infectious Diseases  Hematology  Neurosurgery  Cardiology  Teaching Service Dr. Loney Laurence for evaluation for Suboxone Clinic    Procedures:  Emergent C3-C7 laminectomy for decompression of spinal cord and debridement of epidural abscesson 09/15/2017  TEE on 09/20/2017: No evidence of vegetations  Transthoracic ECHO on 09/12/2017 Study Conclusions  - Left ventricle: The cavity size was normal. Wall thickness was normal. Systolic function was normal. The estimated ejection fraction was in the range of 55% to 60%. Wall motion was normal; there were no regional wall motion abnormalities. Left ventricular diastolic function parameters were normal. - Mitral valve: There was mild regurgitation. - Pulmonary arteries:  Systolic pressure was mildly increased. PA peak pressure: 39 mm Hg (S).   Antimicrobials:  Anti-infectives (From admission, onward)   Start     Dose/Rate Route Frequency Ordered Stop   09/27/17 1800  ceFAZolin (ANCEF) IVPB 2g/100 mL premix     2 g 200 mL/hr over 30 Minutes Intravenous Every 8 hours 09/27/17 1622 11/13/17 2359   09/16/17 2300  nafcillin 2 g in dextrose 5 % 100 mL IVPB  Status:  Discontinued     2 g 200 mL/hr over 30 Minutes Intravenous Every 4 hours 09/15/17 2035 09/15/17 2303   09/16/17 1400  rifampin (RIFADIN) capsule 300 mg     300 mg Oral Every 12 hours 09/16/17 1344     09/15/17 2315  nafcillin 2 g in dextrose 5 % 100 mL IVPB  Status:  Discontinued     2 g 200 mL/hr over 30 Minutes Intravenous Every 4 hours 09/15/17 2303 09/27/17 1622   09/15/17 1237  bacitracin 50,000 Units in sodium chloride irrigation 0.9 % 500 mL irrigation  Status:  Discontinued       As needed 09/15/17 1237 09/15/17 1433   09/13/17 1200  nafcillin 2 g in dextrose 5 % 100 mL IVPB  Status:  Discontinued     2 g 200 mL/hr over 30 Minutes Intravenous Every 4 hours 09/13/17 0918  09/15/17 2035   09/12/17 1400  ceFAZolin (ANCEF) IVPB 2g/100 mL premix  Status:  Discontinued     2 g 200 mL/hr over 30 Minutes Intravenous Every 8 hours 09/12/17 0848 09/13/17 0918   09/12/17 1000  vancomycin (VANCOCIN) 1,250 mg in sodium chloride 0.9 % 250 mL IVPB  Status:  Discontinued     1,250 mg 166.7 mL/hr over 90 Minutes Intravenous Every 12 hours 09/11/17 1904 09/12/17 0828   09/12/17 0600  ceFEPIme (MAXIPIME) 1 g in dextrose 5 % 50 mL IVPB  Status:  Discontinued     1 g 100 mL/hr over 30 Minutes Intravenous Every 8 hours 09/11/17 1904 09/12/17 0827   09/11/17 1830  vancomycin (VANCOCIN) 2,000 mg in sodium chloride 0.9 % 500 mL IVPB     2,000 mg 250 mL/hr over 120 Minutes Intravenous  Once 09/11/17 1758 09/12/17 0026   09/11/17 1815  ceFEPIme (MAXIPIME) 2 g in dextrose 5 % 50 mL IVPB     2 g 100 mL/hr over  30 Minutes Intravenous  Once 09/11/17 1809 09/11/17 2208   09/11/17 1800  piperacillin-tazobactam (ZOSYN) IVPB 3.375 g  Status:  Discontinued     3.375 g 12.5 mL/hr over 240 Minutes Intravenous Every 8 hours 09/11/17 1757 09/11/17 1808     Subjective: Seen and examined and had no complaints. No nausea or vomiting. Felt ok today.    Objective: Vitals:   11/11/17 2156 11/11/17 2319 11/12/17 0353 11/12/17 0734  BP: 114/85 113/69 106/62 98/81  Pulse:  66 61   Resp:      Temp:  97.8 F (36.6 C) 97.7 F (36.5 C) 98 F (36.7 C)  TempSrc:  Oral Oral Oral  SpO2:  100% 100%   Weight:      Height:        Intake/Output Summary (Last 24 hours) at 11/12/2017 0826 Last data filed at 11/12/2017 3557 Gross per 24 hour  Intake 740 ml  Output -  Net 740 ml   Filed Weights   09/13/17 0730 09/14/17 0831 09/21/17 0400  Weight: 117.4 kg (258 lb 13.1 oz) 115.8 kg (255 lb 4.7 oz) 111.1 kg (244 lb 14.9 oz)   Examination: Physical Exam:  Constitutional: WN/Wd obese Caucasian female in NAD; appears calm  Eyes: Sclerae anicteric. Lids normal ENMT: External Ears and nose appear normal. MMM Neck: Supple with no JVD Respiratory: Unlabored breathing. No wheezing/rales/rhonchi Cardiovascular: RRRl No appreciable m/r/g. No LE edema noted Abdomen: Soft, NT, Distended due to body habitus. Bowel sounds present  GU: Deferred Musculoskeletal: No contractures or cyanosis; Has a police ankle bracelet Skin: Warm and Dry. No appreciable rashes or lesions on a limited skin eval Neurologic: CN 2-12 grossly intact. No appreciable focal deficits Psychiatric: Normal mood and affect. Intact judgement and insight  Data Reviewed: I have personally reviewed following labs and imaging studies  CBC: Recent Labs  Lab 11/10/17 0829  WBC 7.6  NEUTROABS 3.2  HGB 11.9*  HCT 35.5*  MCV 90.3  PLT 322   Basic Metabolic Panel: Recent Labs  Lab 11/10/17 0829  NA 138  K 4.1  CL 103  CO2 26  GLUCOSE 108*  BUN  10  CREATININE 0.61  CALCIUM 9.1  MG 1.9  PHOS 4.9*   GFR: Estimated Creatinine Clearance: 107.4 mL/min (by C-G formula based on SCr of 0.61 mg/dL). Liver Function Tests: Recent Labs  Lab 11/10/17 0829  AST 42*  ALT 30  ALKPHOS 85  BILITOT 0.5  PROT 7.3  ALBUMIN  3.2*   No results for input(s): LIPASE, AMYLASE in the last 168 hours. No results for input(s): AMMONIA in the last 168 hours. Coagulation Profile: No results for input(s): INR, PROTIME in the last 168 hours. Cardiac Enzymes: No results for input(s): CKTOTAL, CKMB, CKMBINDEX, TROPONINI in the last 168 hours. BNP (last 3 results) No results for input(s): PROBNP in the last 8760 hours. HbA1C: No results for input(s): HGBA1C in the last 72 hours. CBG: No results for input(s): GLUCAP in the last 168 hours. Lipid Profile: No results for input(s): CHOL, HDL, LDLCALC, TRIG, CHOLHDL, LDLDIRECT in the last 72 hours. Thyroid Function Tests: No results for input(s): TSH, T4TOTAL, FREET4, T3FREE, THYROIDAB in the last 72 hours. Anemia Panel: No results for input(s): VITAMINB12, FOLATE, FERRITIN, TIBC, IRON, RETICCTPCT in the last 72 hours. Sepsis Labs: No results for input(s): PROCALCITON, LATICACIDVEN in the last 168 hours.  No results found for this or any previous visit (from the past 240 hour(s)).   Radiology Studies: No results found.   Scheduled Meds: . buprenorphine-naloxone  1 tablet Sublingual Daily  . Chlorhexidine Gluconate Cloth  6 each Topical Daily  . cloNIDine  0.1 mg Oral BH-qamhs   Followed by  . [START ON 11/13/2017] cloNIDine  0.1 mg Oral QAC breakfast  . DULoxetine  60 mg Oral Daily  . enoxaparin (LOVENOX) injection  40 mg Subcutaneous Q24H  . rifampin  300 mg Oral Q12H  . senna-docusate  1 tablet Oral BID  . sodium chloride flush  10-40 mL Intracatheter Q12H   Continuous Infusions: .  ceFAZolin (ANCEF) IV Stopped (11/12/17 2233)    LOS: 38 days   Kerney Elbe, DO [facility] Pager [phone]  If 7PM-7AM, please contact night-coverage [email or web address] Password [facility] 11/12/2017, 8:26 AM

## 2017-11-13 LAB — CBC WITH DIFFERENTIAL/PLATELET
Basophils Absolute: 0.1 10*3/uL (ref 0.0–0.1)
Basophils Relative: 1 %
EOS ABS: 0.4 10*3/uL (ref 0.0–0.7)
EOS PCT: 5 %
HCT: 33.4 % — ABNORMAL LOW (ref 36.0–46.0)
Hemoglobin: 11.2 g/dL — ABNORMAL LOW (ref 12.0–15.0)
LYMPHS ABS: 3.4 10*3/uL (ref 0.7–4.0)
Lymphocytes Relative: 44 %
MCH: 30.5 pg (ref 26.0–34.0)
MCHC: 33.5 g/dL (ref 30.0–36.0)
MCV: 91 fL (ref 78.0–100.0)
Monocytes Absolute: 0.7 10*3/uL (ref 0.1–1.0)
Monocytes Relative: 10 %
Neutro Abs: 3.1 10*3/uL (ref 1.7–7.7)
Neutrophils Relative %: 40 %
PLATELETS: 317 10*3/uL (ref 150–400)
RBC: 3.67 MIL/uL — AB (ref 3.87–5.11)
RDW: 12.7 % (ref 11.5–15.5)
WBC: 7.6 10*3/uL (ref 4.0–10.5)

## 2017-11-13 LAB — COMPREHENSIVE METABOLIC PANEL
ALT: 29 U/L (ref 14–54)
ANION GAP: 9 (ref 5–15)
AST: 39 U/L (ref 15–41)
Albumin: 3.2 g/dL — ABNORMAL LOW (ref 3.5–5.0)
Alkaline Phosphatase: 87 U/L (ref 38–126)
BUN: 12 mg/dL (ref 6–20)
CHLORIDE: 98 mmol/L — AB (ref 101–111)
CO2: 28 mmol/L (ref 22–32)
Calcium: 8.7 mg/dL — ABNORMAL LOW (ref 8.9–10.3)
Creatinine, Ser: 0.65 mg/dL (ref 0.44–1.00)
GFR calc non Af Amer: >60 mL/min (ref >60)
Glucose, Bld: 116 mg/dL — ABNORMAL HIGH (ref 65–99)
POTASSIUM: 4 mmol/L (ref 3.5–5.1)
SODIUM: 135 mmol/L (ref 135–145)
Total Bilirubin: 0.7 mg/dL (ref 0.3–1.2)
Total Protein: 7.1 g/dL (ref 6.5–8.1)

## 2017-11-13 LAB — MAGNESIUM: Magnesium: 1.9 mg/dL (ref 1.7–2.4)

## 2017-11-13 LAB — PHOSPHORUS: PHOSPHORUS: 5.9 mg/dL — AB (ref 2.5–4.6)

## 2017-11-13 NOTE — Progress Notes (Signed)
PROGRESS NOTE    [name]  SAY:[id number] DOB: 23-Jul-1966 DOA: 09/11/2017 PCP: Marliss Coots, NP   Brief Narrative: [name] is a 52 y.o. female with history of PTSD, depression, intravenous drug use, chronic back pain status post previous back surgery, ADD, bilateral ovarian cysts, PID, sciatica presented on 09/11/2017 with severe back and neck pain. MRI of the T and L-spine revealed ventral epidural abscess and she was also found to have MSSA bacteremia. She was put on IV antibiotics. ID and neurosurgery were consulted. Patient was transferred to [facility]. She developed urinary retention on 09/15/2017 for which she required emergent C3-C7 laminectomy for decompression of spinal cord and debridement of epidural abscess. She had TEE on 09/20/2017 which was negative for vegetations. ID recommends at least 6 weeks of IV antibiotics and last negative Blood Cx were 09/19/17. IV Abx will be continued through 11/13/16. Dr. Loney Laurence of [facility] evaluated and will start patient on Buprenorphine-Naloxone in AM as she is a good candidate for Suboxone Therapy. Anticipate D/C in AM after Abx Therapy is complete.   Assessment & Plan:   Principal Problem:   Severe sepsis ([place]) Active Problems:   Hypokalemia   Lymphadenopathy   Back pain   Hyponatremia   Right ovarian cyst   Hypotension   MSSA bacteremia   Abscess in epidural space of thoracic spine   Mediastinal mass   Obesity, Class III, BMI 40-49.9 (morbid obesity) (HCC)   Leucocytosis   Neck pain   Abscess in epidural space of cervical spine  MSSA Bacteremia -TEE negative for endocarditis 11/26 -Blood cultures negative from 11/25 -Clarified with ID on 12/27; plan is for Ancef and rifampin for 8 weeks total. Patient will get treatment in the hospital secondary to history of IV drug use. -Discussed with Dr. Linus Salmons today and he recommended po Doxycycline for 30 days after IV Abx -Follow up with ID Clinic as  an outpatient; Sent Staff Message to Lyda Jester to arrange   Spinal Epidural Abscess extending from C2-T8 -No Neurological deficits,  -Status post emergent C3-C7 laminectomy for decompression of the spinal cord, debridement of epidural abscess on 11/21 -Continue IV 2 grams q8h and Rifampin 300 mg po q12h per ID recommendations (Last dose will be 11/13/17) -Discussed with Dr. Scharlene Gloss and recommended po Doxycycline for 30 days after treatment with IV Abx ends today.   History of Polysubstance Abuse/ IV Drug Use -Cannot be discharged with IV access or PICC line, hence will remain inpatient while on IV antibiotics -Teaching Service Dr. Darvin Neighbours for evaluation for Suboxone; See Below   Chronic Pain Syndrome -Continue Duloxetine 60 mg po Daily, Methocarbamol 750 mg po q8hprn,  -Bowel Regimen with Senna-Docusate 1 tab po BID and Miralax 17 grams po Dailyprn  -Avoid IV Narcotics  -D/C'd Oxycodone 5 mg q12hprn yesterday as patient was transitioned to Buprenorphine-Naloxone 8 mg-103m in AM and plan for daily dosing based on COWS Score -C/w Ibuprofen 600 mg q6hprn and with Diclofenac Sodium 2 grams Topically 4 times a day PRN  -COWS Protocol BID  -Started on Clonidine Taper. Will adjust taper for symptoms. C/w Taper as Ordered; Patient to receive 0.1 mg po BH-q AM and HS x today and then start 0.1 mg po Daily x 2 doses tomorrow -Dr. MLoney Laurenceevaluated and feels patient is a good candidate for Suboxone and will start in AM; Patient will be provided outpatient script for Buprenorphine with Coupon and will need to follow up in  the [facility] on 11/17/17 at 1315pm.  -Patient tolerating Suboxone without issues and feels good  Obesity, Class III, BMI 40-49.9 (Morbid Obesity) ([place]) -Patient counseled on diet and weight control  -Nutrition consult appreciated.   Reactive Thrombocytosis -Last seen by Dr. Jana Hakim on 11/18 to r/o Lymphoma  -Platelet Count improved went from 972 ->  317 -Repeat Blood work as an outpatient   Hepatitis C -AST (39) and ALT (29) wnL done 11/10/17 -Hep C Genotype was 1a and HCV Quantitative was 2,240,000 and HCV Quantitave Log was 6.350 -Repeat CMP in AM -Outpatient follow-up with ID once IV Abx Infection is complete   Normocytic Iron Deficiency Anemia -Iron Level was 24, UIBC 208, TIBC 232, Saturation Ratios 10, Ferritin 193, and Folate 12.5; Vitamin B12 was 679 -Stable. Hb/Hct now 11.2/33.4 -Continue to Monitor for S/Sx of Bleeding -Repeat CBC as an outpatient   Hyponatremia -Improved. Na+ now 135 -Continue to Monitor intermittently and repeat CMP in AM   Elevated Alkaline Phosphatase, improved -No abdominal pain.  -Unknown etiology. -Resolved as Alk Phos was 87 -Repeat CMP as an outpatient   Severe Sepsis in the setting of MSSA Bacteremia and Abscesses in the Cervical and Thoracic Spine, Improved -Sepsis physiology currently resolved -WBC was 7.6 today  -Repeat CBC as an outpatient  -Patient is Hemodynamically stable   Hyperphosphatemia -Patient's Phos Level was 5.9 -Continue to Monitor as an outpatient and repeat Phos level at PCP follow   DVT prophylaxis: Enoxaparin 40 mg sq q24h Code Status: FULL CODE Family Communication: No family present at Bedside  Disposition Plan: Anticipate D/C Home 11/14/17 when IV Therapies are completed.  Consultants:   Infectious Diseases  Hematology  Neurosurgery  Cardiology  Teaching Service Dr. Loney Laurence for evaluation for Suboxone Clinic    Procedures:  Emergent C3-C7 laminectomy for decompression of spinal cord and debridement of epidural abscesson 09/15/2017  TEE on 09/20/2017: No evidence of vegetations  Transthoracic ECHO on 09/12/2017 Study Conclusions  - Left ventricle: The cavity size was normal. Wall thickness was normal. Systolic function was normal. The estimated ejection fraction was in the range of 55% to 60%. Wall motion was normal; there were no  regional wall motion abnormalities. Left ventricular diastolic function parameters were normal. - Mitral valve: There was mild regurgitation. - Pulmonary arteries: Systolic pressure was mildly increased. PA peak pressure: 39 mm Hg (S).   Antimicrobials:  Anti-infectives (From admission, onward)   Start     Dose/Rate Route Frequency Ordered Stop   09/27/17 1800  ceFAZolin (ANCEF) IVPB 2g/100 mL premix     2 g 200 mL/hr over 30 Minutes Intravenous Every 8 hours 09/27/17 1622 11/13/17 2359   09/16/17 2300  nafcillin 2 g in dextrose 5 % 100 mL IVPB  Status:  Discontinued     2 g 200 mL/hr over 30 Minutes Intravenous Every 4 hours 09/15/17 2035 09/15/17 2303   09/16/17 1400  rifampin (RIFADIN) capsule 300 mg     300 mg Oral Every 12 hours 09/16/17 1344     09/15/17 2315  nafcillin 2 g in dextrose 5 % 100 mL IVPB  Status:  Discontinued     2 g 200 mL/hr over 30 Minutes Intravenous Every 4 hours 09/15/17 2303 09/27/17 1622   09/15/17 1237  bacitracin 50,000 Units in sodium chloride irrigation 0.9 % 500 mL irrigation  Status:  Discontinued       As needed 09/15/17 1237 09/15/17 1433   09/13/17 1200  nafcillin 2 g in dextrose  5 % 100 mL IVPB  Status:  Discontinued     2 g 200 mL/hr over 30 Minutes Intravenous Every 4 hours 09/13/17 0918 09/15/17 2035   09/12/17 1400  ceFAZolin (ANCEF) IVPB 2g/100 mL premix  Status:  Discontinued     2 g 200 mL/hr over 30 Minutes Intravenous Every 8 hours 09/12/17 0848 09/13/17 0918   09/12/17 1000  vancomycin (VANCOCIN) 1,250 mg in sodium chloride 0.9 % 250 mL IVPB  Status:  Discontinued     1,250 mg 166.7 mL/hr over 90 Minutes Intravenous Every 12 hours 09/11/17 1904 09/12/17 0828   09/12/17 0600  ceFEPIme (MAXIPIME) 1 g in dextrose 5 % 50 mL IVPB  Status:  Discontinued     1 g 100 mL/hr over 30 Minutes Intravenous Every 8 hours 09/11/17 1904 09/12/17 0827   09/11/17 1830  vancomycin (VANCOCIN) 2,000 mg in sodium chloride 0.9 % 500 mL IVPB     2,000  mg 250 mL/hr over 120 Minutes Intravenous  Once 09/11/17 1758 09/12/17 0026   09/11/17 1815  ceFEPIme (MAXIPIME) 2 g in dextrose 5 % 50 mL IVPB     2 g 100 mL/hr over 30 Minutes Intravenous  Once 09/11/17 1809 09/11/17 2208   09/11/17 1800  piperacillin-tazobactam (ZOSYN) IVPB 3.375 g  Status:  Discontinued     3.375 g 12.5 mL/hr over 240 Minutes Intravenous Every 8 hours 09/11/17 1757 09/11/17 1808     Subjective: Seen and examined and stated she felt good. No nausea or vomiting. No CP or SOB.   Objective: Vitals:   11/12/17 2127 11/12/17 2310 11/13/17 0402 11/13/17 0800  BP: 136/85 123/84 120/76 123/81  Pulse:  86 70 69  Resp:  _0 Temp:  98.3 F (36.8 C) 98.3 F (36.8 C) 98.3 F (36.8 C)  TempSrc:  Oral Oral Oral  SpO2:  96% 100% 100%  Weight:      Height:        Intake/Output Summary (Last 24 hours) at 11/13/2017 0826 Last data filed at 11/13/2017 1700 Gross per 24 hour  Intake 1040 ml  Output -  Net 1040 ml   Filed Weights   09/13/17 0730 09/14/17 0831 09/21/17 0400  Weight: 117.4 kg (258 lb 13.1 oz) 115.8 kg (255 lb 4.7 oz) 111.1 kg (244 lb 14.9 oz)   Examination: Physical Exam:  Constitutional: WN/WD Obese Caucasian female in NAD; Appears calm Eyes: Sclerae anicteric. Lids normal ENMT: External Ears and Nose appear normal; MMM Neck: Supple with no JVD Respiratory: CTAB; Unlabored breathing; no wheezing/rales/rhonchi Cardiovascular: RRR; No appreciable m/r/g. No LE pitting edema noted Abdomen: Soft, NT, Distended due to body habitus. Bowel Sounds present GU: Deferred Musculoskeletal: No contractures; No cyanosis Skin: Warm and Dry; No appreciable rashes or lesions on a limited skin evaluation  Neurologic: CN 2-12 grossly intact. No appreciable focal deficits Psychiatric: Normal mood and affect. Intact judgement and insight   Data Reviewed: I have personally reviewed following labs and imaging studies  CBC: Recent Labs  Lab 11/10/17 0829  11/13/17 0500  WBC 7.6 7.6  NEUTROABS 3.2 3.1  HGB 11.9* 11.2*  HCT 35.5* 33.4*  MCV 90.3 91.0  PLT 345 174   Basic Metabolic Panel: Recent Labs  Lab 11/10/17 0829 11/13/17 0500  NA 138 135  K 4.1 4.0  CL 103 98*  CO2 26 28  GLUCOSE 108* 116*  BUN 10 12  CREATININE 0.61 0.65  CALCIUM 9.1 8.7*  MG 1.9 1.9  PHOS 4.9*  5.9*   GFR: Estimated Creatinine Clearance: 107.4 mL/min (by C-G formula based on SCr of 0.65 mg/dL). Liver Function Tests: Recent Labs  Lab 11/10/17 0829 11/13/17 0500  AST 42* 39  ALT 30 29  ALKPHOS 85 87  BILITOT 0.5 0.7  PROT 7.3 7.1  ALBUMIN 3.2* 3.2*   No results for input(s): LIPASE, AMYLASE in the last 168 hours. No results for input(s): AMMONIA in the last 168 hours. Coagulation Profile: No results for input(s): INR, PROTIME in the last 168 hours. Cardiac Enzymes: No results for input(s): CKTOTAL, CKMB, CKMBINDEX, TROPONINI in the last 168 hours. BNP (last 3 results) No results for input(s): PROBNP in the last 8760 hours. HbA1C: No results for input(s): HGBA1C in the last 72 hours. CBG: No results for input(s): GLUCAP in the last 168 hours. Lipid Profile: No results for input(s): CHOL, HDL, LDLCALC, TRIG, CHOLHDL, LDLDIRECT in the last 72 hours. Thyroid Function Tests: No results for input(s): TSH, T4TOTAL, FREET4, T3FREE, THYROIDAB in the last 72 hours. Anemia Panel: No results for input(s): VITAMINB12, FOLATE, FERRITIN, TIBC, IRON, RETICCTPCT in the last 72 hours. Sepsis Labs: No results for input(s): PROCALCITON, LATICACIDVEN in the last 168 hours.  No results found for this or any previous visit (from the past 240 hour(s)).   Radiology Studies: No results found.   Scheduled Meds: . buprenorphine-naloxone  1 tablet Sublingual Daily  . Chlorhexidine Gluconate Cloth  6 each Topical Daily  . cloNIDine  0.1 mg Oral QAC breakfast  . DULoxetine  60 mg Oral Daily  . enoxaparin (LOVENOX) injection  40 mg Subcutaneous Q24H  .  rifampin  300 mg Oral Q12H  . senna-docusate  1 tablet Oral BID  . sodium chloride flush  10-40 mL Intracatheter Q12H   Continuous Infusions: .  ceFAZolin (ANCEF) IV Stopped (11/13/17 4680)    LOS: 72 days   Kerney Elbe, DO [facility] Pager [phone]  If 7PM-7AM, please contact night-coverage [email or web address] Password [facility] 11/13/2017, 8:26 AM

## 2017-11-14 MED ORDER — METHOCARBAMOL 750 MG PO TABS: 750.0000 mg | ORAL_TABLET | Freq: Three times a day (TID) | ORAL | 0 refills | Status: DC | PRN

## 2017-11-14 MED ORDER — DICLOFENAC SODIUM 1 % TD GEL: 2.0000 g | Freq: Four times a day (QID) | TRANSDERMAL | 0 refills | Status: DC | PRN

## 2017-11-14 MED ORDER — DULOXETINE HCL 60 MG PO CPEP: 60.0000 mg | ORAL_CAPSULE | Freq: Every day | ORAL | 3 refills | Status: DC

## 2017-11-14 MED ORDER — DOXYCYCLINE HYCLATE 100 MG PO CAPS: 100.0000 mg | ORAL_CAPSULE | Freq: Two times a day (BID) | ORAL | 0 refills | Status: AC

## 2017-11-14 NOTE — Discharge Summary (Signed)
Physician Discharge Summary  [name]:[id number] DOB: Apr 05, 1966 DOA: 09/11/2017  PCP: Marliss Coots, NP  Admit date: 09/11/2017 Discharge date: 11/14/2017  Admitted From: Home Disposition:  Home  Recommendations for Outpatient Follow-up:  1. Follow up with PCP in 1-2 weeks 2. Follow up with Neurosurgery Dr. Kathyrn Sheriff in 3 weeks 3. Follow up with [facility] within 1 week 4. Follow up with [facility] on 11/17/17 at 1315pm.  5. Please obtain CMP/CBC, Mag, Phos in one week 6. Please follow up on the following pending results:  Home Health: No Equipment/Devices: None   Discharge Condition: Stable  CODE STATUS: FULL CODE  Diet recommendation: Heart Healthy Diet   Brief/Interim Summary: Virginia Achey Mattsonis a 52 y.o.female with history of PTSD, depression, intravenous drug use, chronic back pain status post previous back surgery, ADD, bilateral ovarian cysts, PID, sciatica presented on 09/11/2017 to [facility] with severe back and neck pain. She was admitted to ICU/SDU and found to have syringes with clear yellow fluid with a needles. MRI of the T and L-spine revealed ventral epidural abscess and she was also found to have MSSA bacteremia. She was put on IV antibiotics. ID and neurosurgery were consulted. Patient was [facility] 09[facility]12[facility]17.   She under went TTE and TEE and then  Because she developed urinary retention on 09/15/2017 for which she required emergent C3-C7 laminectomy for decompression of spinal cord and debridement of epidural abscess. She had TEE on 09/20/2017 which was negative for vegetations. ID recommended at least 6 weeks of IV antibiotics and last negative Blood Cx were 09/19/17. She remained hospitalized for IV Ax and IV Abx were continued through 11/13/16. I spoke to Dr. Linus Salmons in ID who recommended sending  Patient out on po Doxycycline for 30 days. Prior to her discharge her opiates were being taper and then I asked Dr. Loney Laurence  of [facility] to evaluate the patent and will start patient on Buprenorphine-Naloxone in AM as she is a good candidate for Suboxone Therapy. She was discharged after IV Abx were completed and after PICC Line was removed. She was deemed medically stable to D/C home and she will need to follow up with PCP, Neurosurgery, [facility] for Suboxone.  Discharge Diagnoses:  Principal Problem:   Severe sepsis ([place]) Active Problems:   Hypokalemia   Lymphadenopathy   Back pain   Hyponatremia   Right ovarian cyst   Hypotension   MSSA bacteremia   Abscess in epidural space of thoracic spine   Mediastinal mass   Obesity, Class III, BMI 40-49.9 (morbid obesity) (H[facility])   Leucocytosis   Neck pain   Abscess in epidural space of cervical spine  MSSA Bacteremia s/p 6 weeks of IV Abx Therapy -TEE negative for endocarditis 11/26 -Blood cultures negative from 11/25 -Clarified with ID on 12/27; plan is for Ancef and rifampin for 6 weeks total (See Dr. Algis Downs note from 09/27/17 as OPAT Orders were in place). Patient will get treatment in the hospital secondary to history of IV drug use. -Discussed with Dr. 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 Scharlene Gloss and recommended po Doxycycline for 30 days after treatment with IV Abx ending yesteday.   History of Polysubstance Abuse/ IV Drug Use -Cannot be discharged with IV access or PICC line, hence will remain inpatient while on IV antibiotics -Teaching Service Dr. Darvin Neighbours for  evaluation for Suboxone; See Below   Chronic Pain Syndrome -Continue Duloxetine 60 mg po Daily, Methocarbamol 750 mg po q8hprn,  -Bowel Regimen with Senna-Docusate 1 tab po BID and Miralax 17 grams po Dailyprn  -Avoid IV Narcotics  -D/C'd Oxycodone 5 mg q12hprn as patient was transitioned to Buprenorphine-Naloxone 8 mg-'2mg'$  on 11/13/17 and plan for daily dosing based on COWS Score -C/w Ibuprofen 600 mg q6hprn and with Diclofenac Sodium 2 grams Topically 4 times a day PRN  -COWS Protocol BID  -Completed Clonidine Taper -Dr. Loney Laurence evaluated and feels patient is a good candidate for Suboxone and will start in AM; Patient will be provided outpatient script for Buprenorphine with Coupon and will need to follow up in the [facility] on 11/17/17 at 1315pm.  -Patient tolerating Suboxone without issues and feels good and will be Discharged with script provided by Dr. Loney Laurence   Obesity, Class III, BMI 40-49.9 (Morbid Obesity) ([place]) -Patient counseled on diet and weight control  -Nutrition consult appreciated.   Reactive Thrombocytosis -Last seen by Dr. Jana Hakim on 11/18 to r/o Lymphoma  -Platelet Count improved went from 972 ->317 -Repeat Blood work as an outpatient   Hepatitis C -AST(39)and ALT (29)wnL done 11/10/17 -Hep C Genotype was 1a and HCV Quantitative was 2,240,000 and HCV Quantitave Log was 6.350 -Repeat CMP in AM -Outpatient follow-up with ID once IV Abx Infection is complete   Normocytic Iron Deficiency Anemia -Iron Level was 24, UIBC 208, TIBC 232, Saturation Ratios 10, Ferritin 193, and Folate 12.5; Vitamin B12 was 679 -Stable. Hb/Hct now 11.2/33.4 -Continue to Monitor for S/Sx of Bleeding -Repeat CBC as an outpatient   Hyponatremia -Improved. Na+now135 -Continue to Monitor intermittently and repeat CMP as an outpatient    Elevated Alkaline Phosphatase, improved -No abdominal pain.  -Unknown etiology. -Resolved as Alk Phos was 87 -Repeat CMP as an  outpatient   Severe Sepsis in the setting of MSSA Bacteremia and Abscesses in the Cervical and Thoracic Spine, Improved -Sepsis physiology currently resolved -WBC was 7.6 today  -Repeat CBC as an outpatient  -Patient is Hemodynamically stable   Hyperphosphatemia -Patient's Phos Level was 5.9 -Continue to Monitor as an outpatient and repeat Phos level at PCP follow up  Discharge Instructions  Discharge Instructions    Call MD for:  difficulty breathing, headache or visual disturbances   Complete by:  As directed    Call MD for:  extreme fatigue   Complete by:  As directed    Call MD for:  hives   Complete by:  As directed    Call MD for:  persistant dizziness or light-headedness   Complete by:  As directed    Call MD for:  persistant nausea and vomiting   Complete by:  As directed    Call MD for:  redness, tenderness, or signs of infection (pain, swelling, redness, odor or green/yellow discharge around incision site)   Complete by:  As directed    Call MD for:  severe uncontrolled pain   Complete by:  As directed    Call MD for:  temperature >100.4   Complete by:  As directed  Diet - low sodium heart healthy   Complete by:  As directed    Discharge instructions   Complete by:  As directed    Follow up with PCP, Infectious Diseases, Neurosurgery as an outpatient. Take all medications as prescribed. If symptoms change or worsen please return to the ED for evaluation.   Increase activity slowly   Complete by:  As directed      Allergies as of 11/14/2017      Reactions   Imitrex [sumatriptan Base] Shortness Of Breath   Clarithromycin Nausea And Vomiting   Clindamycin/lincomycin Diarrhea   Violently sick-projectile vomiting   Morphine And Related Nausea And Vomiting   Vicodin [hydrocodone-acetaminophen] Nausea And Vomiting      Medication List    STOP taking these medications   cephALEXin 500 MG capsule Commonly known as:  KEFLEX   potassium chloride 10 MEQ  tablet Commonly known as:  K-DUR     TAKE these medications   acetaminophen 500 MG tablet Commonly known as:  TYLENOL Take 500 mg by mouth every 6 (six) hours as needed for moderate pain.   buprenorphine-naloxone 8-2 mg Subl SL tablet Commonly known as:  SUBOXONE Place 1 tablet under the tongue 2 (two) times daily.   diclofenac sodium 1 % Gel Commonly known as:  VOLTAREN Apply 2 g topically 4 (four) times daily as needed (pain).   doxycycline 100 MG capsule Commonly known as:  VIBRAMYCIN Take 1 capsule (100 mg total) by mouth 2 (two) times daily.   DULoxetine 60 MG capsule Commonly known as:  CYMBALTA Take 1 capsule (60 mg total) by mouth daily. Start taking on:  11/15/2017   ibuprofen 200 MG tablet Commonly known as:  ADVIL,MOTRIN Take 200 mg by mouth every 6 (six) hours as needed for moderate pain.   methocarbamol 750 MG tablet Commonly known as:  ROBAXIN Take 1 tablet (750 mg total) by mouth every 8 (eight) hours as needed for muscle spasms. What changed:    medication strength  how much to take      Follow-up Information    Consuella Lose, MD Follow up in 3 week(s).   Specialty:  Neurosurgery Contact information: [address] [place] [address] [phone]        [name], [name], NP. Call.   Why:  Follow up within 1 week Contact information: [place] [address] [phone]        [facility] Follow up.   Why:  Follow up on 1/23 at 1315 for Suboxone and OUD treatment.   Contact information: [address] [place] [phone]       Carlyle Basques, MD. Call.   Specialty:  Infectious Diseases Why:  Follow up with ID as an outpatient. Contact information: [place] [address] [place] [address] [phone]          Allergies  Allergen Reactions  . Imitrex [Sumatriptan Base] Shortness Of Breath  . Clarithromycin Nausea And  Vomiting  . Clindamycin/Lincomycin Diarrhea    Violently sick-projectile vomiting  . Morphine And Related Nausea And Vomiting  . Vicodin [Hydrocodone-Acetaminophen] Nausea And Vomiting   Consultations:  Infectious Diseases  Hematology  Neurosurgery  Cardiology  Teaching Service Dr. Loney Laurence for evaluation for Suboxone Clinic   Procedures/Studies:  No results found. Emergent C3-C7 laminectomy for decompression of spinal cord and debridement of epidural abscesson 09/15/2017  TEE on 09/20/2017: No evidence of vegetations  Transthoracic  ECHO on 09/12/2017 Study Conclusions  - Left ventricle: The cavity size was normal. Wall thickness was normal. Systolic function was normal. The estimated ejection fraction was in the range of 55% to 60%. Wall motion was normal; there were no regional wall motion abnormalities. Left ventricular diastolic function parameters were normal. - Mitral valve: There was mild regurgitation. - Pulmonary arteries: Systolic pressure was mildly increased. PA peak pressure: 39 mm Hg (S).  Subjective: Seen and examined at bedside and felt good. No nausea or vomiting. No CP. Ready to go home.  Discharge Exam: Vitals:   11/14/17 0400 11/14/17 0748  BP:  111/68  Pulse:  75  Resp:  14  Temp: 97.8 F (36.6 C) (!) 97.5 F (36.4 C)  SpO2:  98%   Vitals:   11/13/17 2300 11/14/17 0323 11/14/17 0400 11/14/17 0748  BP: 128/64 (!) 104/57  111/68  Pulse: 65 63  75  Resp: '18 16  14  '$ Temp: 98.2 F (36.8 C) (!) 97 F (36.1 C) 97.8 F (36.6 C) (!) 97.5 F (36.4 C)  TempSrc: Oral Oral Oral Oral  SpO2: 100% 98%  98%  Weight:      Height:       General: Pt is alert, awake, not in acute distress Cardiovascular: RRR, S1/S2 +, no rubs, no gallops Respiratory: CTA bilaterally, no wheezing, no rhonchi Abdominal: Soft, NT, Distended due to body habitus, bowel sounds + Extremities: no edema, no cyanosis; Has police ankle braclet around left  Ankle  The results of significant diagnostics from this hospitalization (including imaging, microbiology, ancillary and laboratory) are listed below for reference.    Microbiology: No results found for this or any previous visit (from the past 240 hour(s)).   Labs: BNP (last 3 results) No results for input(s): BNP in the last 8760 hours. Basic Metabolic Panel: Recent Labs  Lab 11/10/17 0829 11/13/17 0500  NA 138 135  K 4.1 4.0  CL 103 98*  CO2 26 28  GLUCOSE 108* 116*  BUN 10 12  CREATININE 0.61 0.65  CALCIUM 9.1 8.7*  MG 1.9 1.9  PHOS 4.9* 5.9*   Liver Function Tests: Recent Labs  Lab 11/10/17 0829 11/13/17 0500  AST 42* 39  ALT 30 29  ALKPHOS 85 87  BILITOT 0.5 0.7  PROT 7.3 7.1  ALBUMIN 3.2* 3.2*   No results for input(s): LIPASE, AMYLASE in the last 168 hours. No results for input(s): AMMONIA in the last 168 hours. CBC: Recent Labs  Lab 11/10/17 0829 11/13/17 0500  WBC 7.6 7.6  NEUTROABS 3.2 3.1  HGB 11.9* 11.2*  HCT 35.5* 33.4*  MCV 90.3 91.0  PLT 345 317   Cardiac Enzymes: No results for input(s): CKTOTAL, CKMB, CKMBINDEX, TROPONINI in the last 168 hours. BNP: Invalid input(s): POCBNP CBG: No results for input(s): GLUCAP in the last 168 hours. D-Dimer No results for input(s): DDIMER in the last 72 hours. Hgb A1c No results for input(s): HGBA1C in the last 72 hours. Lipid Profile No results for input(s): CHOL, HDL, LDLCALC, TRIG, CHOLHDL, LDLDIRECT in the last 72 hours. Thyroid function studies No results for input(s): TSH, T4TOTAL, T3FREE, THYROIDAB in the last 72 hours.  Invalid input(s): FREET3 Anemia work up No results for input(s): VITAMINB12, FOLATE, FERRITIN, TIBC, IRON, RETICCTPCT in the last 72 hours. Urinalysis    Component Value Date/Time   COLORURINE YELLOW 09/11/2017 0709   APPEARANCEUR HAZY (A) 09/11/2017 0709   LABSPEC 1.020 09/11/2017 0709   PHURINE 6.0 09/11/2017 6294  GLUCOSEU NEGATIVE 09/11/2017 0709   HGBUR SMALL  (A) 09/11/2017 0709   BILIRUBINUR NEGATIVE 09/11/2017 0709   KETONESUR NEGATIVE 09/11/2017 0709   PROTEINUR NEGATIVE 09/11/2017 0709   UROBILINOGEN 0.2 06/27/2015 0225   NITRITE NEGATIVE 09/11/2017 0709   LEUKOCYTESUR LARGE (A) 09/11/2017 0709   Sepsis Labs Invalid input(s): PROCALCITONIN,  WBC,  LACTICIDVEN Microbiology No results found for this or any previous visit (from the past 240 hour(s)).  Time coordinating discharge: 35 minutes  SIGNED:  Kerney Elbe, DO [facility] 11/14/2017, 11:11 AM Pager [phone]  If 7PM-7AM, please contact night-coverage [email or web address] Password [email or web address]

## 2017-11-14 NOTE — Progress Notes (Signed)
CSW received handoff regarding possible needs with transportation at time of discharge. CSW called unit RN, Jarrett Soho to discuss. Jarrett Soho, RN stated that patient had set up transportation to go home with her daughter. CSW informed RN that if patient needed a bus pass or cab voucher home to please contact CSW.  CSW signing off.  Madilyn Fireman, MSW, LCSW-A Weekend Clinical Social Worker [phone]

## 2017-11-14 NOTE — Progress Notes (Signed)
Patient discharge instructions given to patient, questions answered. Two printed prescriptions given to patient, other three prescriptions to be picked up by patient at pharmacy.  Patient single lumen PICC taken out by IV team.  Patient belongings sent home with patient. Taken to sister's car via wheelchair.

## 2017-11-17 ENCOUNTER — Ambulatory Visit (INDEPENDENT_AMBULATORY_CARE_PROVIDER_SITE_OTHER): Payer: Self-pay | Admitting: Internal Medicine

## 2017-11-17 ENCOUNTER — Other Ambulatory Visit: Payer: Self-pay

## 2017-11-17 VITALS — BP 120/73 | HR 86 | Temp 98.3°F | Ht 66.0 in | Wt 249.4 lb

## 2017-11-17 DIAGNOSIS — F909 Attention-deficit hyperactivity disorder, unspecified type: Secondary | ICD-10-CM

## 2017-11-17 DIAGNOSIS — F1120 Opioid dependence, uncomplicated: Secondary | ICD-10-CM

## 2017-11-17 DIAGNOSIS — F4310 Post-traumatic stress disorder, unspecified: Secondary | ICD-10-CM

## 2017-11-17 DIAGNOSIS — Z8619 Personal history of other infectious and parasitic diseases: Secondary | ICD-10-CM

## 2017-11-17 DIAGNOSIS — Z813 Family history of other psychoactive substance abuse and dependence: Secondary | ICD-10-CM

## 2017-11-17 MED ORDER — BUPRENORPHINE HCL-NALOXONE HCL 8-2 MG SL SUBL: 1.0000 | SUBLINGUAL_TABLET | Freq: Two times a day (BID) | SUBLINGUAL | 0 refills | Status: DC
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
## 2017-11-17 NOTE — Assessment & Plan Note (Addendum)
Patient has a long history of polysubstance abuse which worsened over the course of 2018 leading to multiple accidental overdoses and a long hospitalization from November 2018 to January 2019 with epidural abscess and MSSA bacteremia.  Prior to hospital discharge she was started on Suboxone therapy for moderate opioid use disorder.  In interval since discharge she has done well with no relapses and appears to have made positive changes to her support system and close contacts.  She was encouraged to continue these changes and appears motivated by the opioid use disorder clinic program.  Refill provided for 1 week supply of Suboxone with follow-up in the OUD clinic.  Suboxone pain clinic reviewed and signed.  Pharmacy to attempt to enroll patient in assistance program for free or discounted Suboxone.  --Cont Suboxone 8-2 mg BID #12 --UDS [employer] today

## 2017-11-17 NOTE — Patient Instructions (Addendum)
Thank you for coming in today [name].  You have a paper refill for the Suboxone that you take twice a day.  We will see you again in a week next Tuesday.  Keep up the great work in the motivation that you have right now.  Be sure to follow-up with your neurosurgeon and the infectious disease doctor also.  We will have our pharmacy team see if you qualify for a program that would provide Suboxone free or at a lower cost.

## 2017-11-18 NOTE — Progress Notes (Signed)
Internal Medicine Clinic Attending  I saw and evaluated the patient.  I personally confirmed the key portions of the history and exam documented by Dr. Johny Chess and I reviewed pertinent patient test results.  The assessment, diagnosis, and plan were formulated together and I agree with the documentation in the resident's note.  Ms. Horn has mod-severe OUD and is doing well on initial treatment with Suboxone.  We will attempt to get her set up on a pharmaceutical support program.  We discussed the clinic and how she will need to come in q weekly for 4-6 weeks for evaluation, which she was open to.  She has court issues including being on house arrest and having to see her parole officer which may make it difficult to come the same day every week, so we will work with her.

## 2017-11-19 ENCOUNTER — Encounter: Payer: Self-pay | Admitting: Pharmacist

## 2017-11-19 NOTE — Progress Notes (Signed)
Patient was approved for suboxone patient assistance program

## 2017-11-19 NOTE — Progress Notes (Signed)
Great work. Thanks!

## 2017-11-22 LAB — TOXASSURE SELECT,+ANTIDEPR,UR

## 2017-11-23 ENCOUNTER — Ambulatory Visit (INDEPENDENT_AMBULATORY_CARE_PROVIDER_SITE_OTHER): Payer: Self-pay | Admitting: Internal Medicine

## 2017-11-23 ENCOUNTER — Other Ambulatory Visit: Payer: Self-pay

## 2017-11-23 VITALS — BP 133/83 | HR 87 | Temp 98.4°F | Resp 20 | Ht 66.0 in | Wt 261.5 lb

## 2017-11-23 DIAGNOSIS — R768 Other specified abnormal immunological findings in serum: Secondary | ICD-10-CM

## 2017-11-23 DIAGNOSIS — F1120 Opioid dependence, uncomplicated: Secondary | ICD-10-CM

## 2017-11-23 DIAGNOSIS — R600 Localized edema: Secondary | ICD-10-CM | POA: Insufficient documentation

## 2017-11-23 DIAGNOSIS — F1199 Opioid use, unspecified with unspecified opioid-induced disorder: Secondary | ICD-10-CM

## 2017-11-23 DIAGNOSIS — Z8619 Personal history of other infectious and parasitic diseases: Secondary | ICD-10-CM

## 2017-11-23 DIAGNOSIS — B182 Chronic viral hepatitis C: Secondary | ICD-10-CM | POA: Insufficient documentation

## 2017-11-23 DIAGNOSIS — F1190 Opioid use, unspecified, uncomplicated: Secondary | ICD-10-CM

## 2017-11-23 MED ORDER — FUROSEMIDE 20 MG PO TABS: 20.0000 mg | ORAL_TABLET | Freq: Once | ORAL | Status: DC

## 2017-11-23 MED ORDER — FUROSEMIDE 20 MG PO TABS: 40.0000 mg | ORAL_TABLET | Freq: Once | ORAL | Status: DC

## 2017-11-23 MED ORDER — FUROSEMIDE 40 MG PO TABS
40.0000 mg | ORAL_TABLET | Freq: Once | ORAL | Status: AC
  Administered 2017-11-23: 40 mg via ORAL

## 2017-11-23 MED ORDER — BUPRENORPHINE HCL-NALOXONE HCL 8-2 MG SL SUBL: 1.0000 | SUBLINGUAL_TABLET | Freq: Two times a day (BID) | SUBLINGUAL | 0 refills | Status: DC

## 2017-11-23 NOTE — Assessment & Plan Note (Signed)
She is doing very well with twice daily dose of Suboxone 8 mg. She denies any craving or recent substance use.  -Continue Suboxone 8 mg twice daily. -Follow-up in 1 week.

## 2017-11-23 NOTE — Patient Instructions (Addendum)
Thank you for visiting clinic today. We are giving you a dose of Lasix in the clinic, hopefully that will help with your swelling. Please avoid salty food in the future. You are doing very well with your opioid  Addiction, keep up the good work. Please follow-up in 2 weeks.

## 2017-11-23 NOTE — Assessment & Plan Note (Signed)
Most likely due to salt retention after eating very salty meal.  She do experience similar episodes in the past after eating salty food.  -She was given a dose of Lasix 40 mg once.

## 2017-11-23 NOTE — Progress Notes (Signed)
   11/23/2017  [name] presents for follow up of opioid use disorder I have reviewed the prior induction visit, follow up visits, and telephone encounters relevant to opiate use disorder (OUD) treatment.   Current daily dose: Suboxone 8 mg twice daily  Date of Induction: 11/12/2017  Current follow up interval, in weeks: 1  The patient has been adherent with the buprenorphine for OUD contract.   Last UDS Result: Contain buprenorphine and its metabolite.  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  The patient was started on Suboxone during her hospitalization.  She is doing very well with twice daily dosing, denies any more craving. She was complaining of feeling extra thirsty since the start of Suboxone-I am unaware of any side effect which can cause polydipsia.  She was also complaining of generalized body edema started last night after eating very salty pork.  She had similar episodes in the past whenever she is very salty food she develops edema, she had made couple of ED visits to get some Lasix which helped getting rid of that extra fluid. She was also complaining of lower leg and bilateral feet pain due to swelling since last night.  According to patient her swelling is improved but not completely gone.  She was still experiencing mild lower extremity discomfort.  She was trying to help herself by eating extra lemon containing water or natural diuresis, she was thinking of going to ED to get some Lasix to get rid of this extra fluid.  She denies any fascial swelling or difficulty with breathing.  She denies any shortness of breath, orthopnea or PND.  Exam:   Vitals:   11/23/17 0943  BP: 133/83  Pulse: 87  Resp: 20  Temp: 98.4 F (36.9 C)  TempSrc: Oral  SpO2: 100%  Weight: 261 lb 8 oz (118.6 kg)  Height:  5\' 6"  (1.676 m)   Gen. well-developed, well-nourished lady, in no acute distress. Lungs.  Clear bilaterally. CVS.  Regular rate and rhythm. Abdomen.  Soft, nontender, bowel sounds positive. Extremities.  1+ pitting edema bilaterally up to below knees.  Pulses intact and symmetrical.  Assessment/Plan:  See Problem Based Charting in the Encounters Tab     Lorella Nimrod, MD  11/23/2017  10:52 AM

## 2017-11-23 NOTE — Assessment & Plan Note (Addendum)
He has a positive antibody positive viral load. She do have abnormal liver function in the past.  -Recheck CMP. -She will need a referral to ID clinic once have her orange card or insurance.

## 2017-11-24 LAB — CMP14 + ANION GAP
ALBUMIN: 3.7 g/dL (ref 3.5–5.5)
ALT: 41 IU/L — AB (ref 0–32)
AST: 49 IU/L — ABNORMAL HIGH (ref 0–40)
Albumin/Globulin Ratio: 1.1 — ABNORMAL LOW (ref 1.2–2.2)
Alkaline Phosphatase: 91 IU/L (ref 39–117)
Anion Gap: 16 mmol/L (ref 10.0–18.0)
BUN/Creatinine Ratio: 14 (ref 9–23)
BUN: 8 mg/dL (ref 6–24)
CO2: 24 mmol/L (ref 20–29)
CREATININE: 0.57 mg/dL (ref 0.57–1.00)
Calcium: 8.9 mg/dL (ref 8.7–10.2)
Chloride: 101 mmol/L (ref 96–106)
GFR calc non Af Amer: 109 mL/min/{1.73_m2} (ref >59)
GFR, EST AFRICAN AMERICAN: 126 mL/min/{1.73_m2} (ref >59)
GLOBULIN, TOTAL: 3.3 g/dL (ref 1.5–4.5)
Glucose: 95 mg/dL (ref 65–99)
Potassium: 4.6 mmol/L (ref 3.5–5.2)
Sodium: 141 mmol/L (ref 134–144)
TOTAL PROTEIN: 7 g/dL (ref 6.0–8.5)

## 2017-11-29 ENCOUNTER — Inpatient Hospital Stay: Payer: Self-pay | Admitting: Infectious Diseases

## 2017-11-29 NOTE — Progress Notes (Signed)
Internal Medicine Clinic Attending  I saw and evaluated the patient.  I personally confirmed the key portions of the history and exam documented by Dr. Reesa Chew and I reviewed pertinent patient test results.  The assessment, diagnosis, and plan were formulated together and I agree with the documentation in the resident's note.  [name] is a woman with severe OUD who is being followed for MAT with suboxone.  She is doing well, seems motivated to continue abstinence from heroin.  She is having a few acute issues today which were addressed by Dr. Reesa Chew.  She has a U tox which is pending from today.  Continue 1 week follow up.  Cravings are well controlled.  She will need ID follow up for HCV.

## 2017-11-30 ENCOUNTER — Ambulatory Visit (INDEPENDENT_AMBULATORY_CARE_PROVIDER_SITE_OTHER): Payer: Self-pay | Admitting: Internal Medicine

## 2017-11-30 ENCOUNTER — Ambulatory Visit: Payer: Self-pay

## 2017-11-30 VITALS — BP 112/61 | HR 80 | Temp 97.9°F | Resp 20 | Wt 261.0 lb

## 2017-11-30 DIAGNOSIS — Z653 Problems related to other legal circumstances: Secondary | ICD-10-CM

## 2017-11-30 DIAGNOSIS — F1120 Opioid dependence, uncomplicated: Secondary | ICD-10-CM

## 2017-11-30 DIAGNOSIS — Z978 Presence of other specified devices: Secondary | ICD-10-CM

## 2017-11-30 DIAGNOSIS — B182 Chronic viral hepatitis C: Secondary | ICD-10-CM

## 2017-11-30 DIAGNOSIS — B1920 Unspecified viral hepatitis C without hepatic coma: Secondary | ICD-10-CM

## 2017-11-30 LAB — TOXASSURE SELECT,+ANTIDEPR,UR

## 2017-11-30 MED ORDER — BUPRENORPHINE HCL-NALOXONE HCL 8-2 MG SL FILM: 1.0000 | ORAL_FILM | Freq: Two times a day (BID) | SUBLINGUAL | 0 refills | Status: DC

## 2017-11-30 NOTE — Assessment & Plan Note (Signed)
She is following up with ID on 2/19.  I reminded her to discuss this diagnosis with Dr. Johnnye Sima.

## 2017-11-30 NOTE — Patient Instructions (Signed)
Lashaye - -   Thank you for coming in today.  I am glad you are doing so well!    Come back to see [place] in 1 week.

## 2017-11-30 NOTE — Progress Notes (Signed)
   11/30/2017  [name] presents for follow up of opioid use disorder I have reviewed the prior induction visit, follow up visits, and telephone encounters relevant to opiate use disorder (OUD) treatment.   Current daily dose: 16mg  daily  Date of Induction: 11/12/17  Current follow up interval, in weeks: 1 week  The patient has been adherent with the buprenorphine for OUD contract.   Last UDS Result: appropriate  HPI: [name] is a woman with Moderate OUD who presents for MAT.  She has been doing well on buprenorphine-naloxone therapy since her induction.  She has an appointment with ID on 12/14/17 to see Dr. Johnnye Sima for her acute infectious issues and for Hepatitis C follow up.  Today, [name] reports that she is doing well.  She has almost no cravings related to her use.  She is very happy to be off of heroin and has no intention of going back.  She is getting support from her family to stay clean.  She has some legal issues which she is working out now.  She has appointments with ID and neurosurgery for her issues related to her most recent hospitalization.  She has been approved for medication assistance with suboxone for 1 year.  I reviewed her last UDS and this was appropriate.    Exam:   Vitals:   11/30/17 1122  BP: 112/61  Pulse: 80  Resp: 20  Temp: 97.9 F (36.6 C)  TempSrc: Oral  SpO2: 98%  Weight: 261 lb (118.4 kg)    Gen: She is doing well, alert, oriented.  HENT: Neck brace in place Pulm: No breathing difficulty, no wheezing Psych: Tearful (happy tears) on exam, feeling well about getting life back on track  Assessment/Plan:  See Problem Based Charting in the Encounters Tab     Sid Falcon, MD  11/30/2017  1:42 PM

## 2017-11-30 NOTE — Assessment & Plan Note (Signed)
She is much improved. Cravings are all but gone.  She reports that she has no desire for drugs right now and is looking forward to her future.  She has been abstinent from heroin or other illicit drugs.  Feels the 16mg  daily dose of buprenorphine is working well for her.  UDS reviewed and appropriate.    [place] narcotic database reviewed and was appropriate.    Plan Continue buprenorphine-naloxone 8mg -2mg  BID UDS today Return to clinic in 1 week.

## 2017-12-03 LAB — TOXASSURE SELECT,+ANTIDEPR,UR

## 2017-12-07 ENCOUNTER — Encounter: Payer: Self-pay | Admitting: Internal Medicine

## 2017-12-07 ENCOUNTER — Telehealth: Payer: Self-pay | Admitting: Internal Medicine

## 2017-12-07 ENCOUNTER — Ambulatory Visit (INDEPENDENT_AMBULATORY_CARE_PROVIDER_SITE_OTHER): Payer: Self-pay | Admitting: Internal Medicine

## 2017-12-07 ENCOUNTER — Ambulatory Visit: Payer: Self-pay

## 2017-12-07 VITALS — BP 131/75 | HR 70 | Temp 98.5°F | Resp 20 | Wt 256.0 lb

## 2017-12-07 DIAGNOSIS — F330 Major depressive disorder, recurrent, mild: Secondary | ICD-10-CM

## 2017-12-07 DIAGNOSIS — Z791 Long term (current) use of non-steroidal anti-inflammatories (NSAID): Secondary | ICD-10-CM

## 2017-12-07 DIAGNOSIS — F1120 Opioid dependence, uncomplicated: Secondary | ICD-10-CM

## 2017-12-07 DIAGNOSIS — B1920 Unspecified viral hepatitis C without hepatic coma: Secondary | ICD-10-CM

## 2017-12-07 DIAGNOSIS — M542 Cervicalgia: Secondary | ICD-10-CM

## 2017-12-07 DIAGNOSIS — Z978 Presence of other specified devices: Secondary | ICD-10-CM

## 2017-12-07 DIAGNOSIS — F419 Anxiety disorder, unspecified: Secondary | ICD-10-CM

## 2017-12-07 DIAGNOSIS — F410 Panic disorder [episodic paroxysmal anxiety] without agoraphobia: Secondary | ICD-10-CM

## 2017-12-07 DIAGNOSIS — G8918 Other acute postprocedural pain: Secondary | ICD-10-CM

## 2017-12-07 DIAGNOSIS — F332 Major depressive disorder, recurrent severe without psychotic features: Secondary | ICD-10-CM

## 2017-12-07 MED ORDER — BUPRENORPHINE HCL-NALOXONE HCL 8-2 MG SL FILM: 1.0000 | ORAL_FILM | Freq: Two times a day (BID) | SUBLINGUAL | 0 refills | Status: DC

## 2017-12-07 NOTE — Addendum Note (Signed)
Addended by: Gilles Chiquito B on: 12/07/2017 11:26 AM   Modules accepted: Orders

## 2017-12-07 NOTE — Assessment & Plan Note (Signed)
She has this as a post surgical issue from cervical abscess.  She is wearing a neck brace.  She needs to make an appointment with neurosurgery.  She is taking a low dose of ibuprofen and having no issues with dyspepsia or nausea at this time.    Plan Increase ibuprofen to 800mg  TID scheduled for 5 - 7 days then decrease back to PRN only She is aware not to take any medications from other people.  Follow up with NSG.

## 2017-12-07 NOTE — Patient Instructions (Addendum)
Hayden are doing so well!  Keep up the good work.   For your neck pain, you can increase your ibuprofen to 800mg  three times per day for 5 - 7 days and then go back to how you are taking it now.   See if this helps with your pain somewhat.    For your anxiety, if you find that this issue is getting worse, please contact your previous Mental Health provider and make an appointment; Noemi Chapel, [phone]  Thank you!  Come back to see me in 2 weeks.

## 2017-12-07 NOTE — Assessment & Plan Note (Signed)
She is doing very well, and despite neck pain, reports this is the best she has felt in "a long time."  She is very happy to be back in a relationship with her family.  She reports no cravings and has no issues getting her medication at this time.    Plan Continue Suboxone 8mg -2mg  BID UDS today [place] narcotic database appropriate Follow up in 2 weeks.

## 2017-12-07 NOTE — Telephone Encounter (Signed)
Called pt - no answer; left message rx was sent [facility] on [address] and Bonnita Nasuti will call be sure it was received.

## 2017-12-07 NOTE — Progress Notes (Signed)
   12/07/2017  [name] presents for follow up of opioid use disorder I have reviewed the prior induction visit, follow up visits, and telephone encounters relevant to opiate use disorder (OUD) treatment.   Current daily dose: 16mg  daily  Date of Induction: 11/12/17  Current follow up interval, in weeks: 1 week  The patient has been adherent with the buprenorphine for OUD contract.   Last UDS Result: Appropriate  HPI: [name] is a person with moderate OUD who is in MAT for ~ 1 month.  She has been doing well on 16mg  of buprenorphine daily.  Today she reports that she is doing very well.  She has no cravings at this time.  She notes that she is able to rekindle a relationship with her daughter and her sister and she is very happy about that.  She does report, due to the weather, that she feels her neck pain is getting worse, about an 8/10.  She is taking ibuprofen 600mg  about twice per day for this with some relief.  She is also alternating ice and heat and this seems to help.  She is aware of her appointment with ID on 2/19 and plans to discuss her neck infection and HCV.  She continues to take doxycycline without issue.    She does report having an anxiety attack this past week.  She took an old Rx of xanax at that time, just one pill.  She wants to be transparent about her use.  She did not illicitly obtain any medications.  She used to see Noemi [facility] at [facility] for [facility] and PTSD.  She feels she is okay now.  We discussed how anxiety can worsen once on treatment for OUD and that if she needs help she will call this provider to set up a visit.    ROS + anxiety attack, neck pain.  - dyspepsia, nausea, dizziness  Exam:   Vitals:   12/07/17 1021  BP: 131/75  Pulse: 70  Resp: 20  Temp: 98.5 F (36.9 C)  TempSrc: Oral  SpO2: 97%  Weight: 256 lb (116.1 kg)    Gen: Awake, alert, pleasant Neck: Wearing neck brace CV: RR, NR, no murmur Pulm:  breathing comfortably, no audible wheezing Psych: Somewhat tearful when talking about her sobriety, but appropriate. MSK: Ankle brace on left ankle.  Assessment/Plan:  See Problem Based Charting in the Encounters Tab     Virginia Falcon, MD  12/07/2017  10:31 AM

## 2017-12-07 NOTE — Telephone Encounter (Signed)
Patient just left office, medicine not at pharmacy yet

## 2017-12-07 NOTE — Assessment & Plan Note (Signed)
Previously treated in Psychiatry for this and for anxiety.  She took an old xanax pill this week when she had a panic attack about her dog.  She feels okay now.   Plan Follow up with Dr. Dorethea Clan if needed, we discussed how anxiety can be worsened while in treatment.

## 2017-12-07 NOTE — Telephone Encounter (Signed)
New script sent

## 2017-12-12 LAB — TOXASSURE SELECT,+ANTIDEPR,UR

## 2017-12-14 ENCOUNTER — Encounter: Payer: Self-pay | Admitting: Infectious Diseases

## 2017-12-14 ENCOUNTER — Ambulatory Visit (INDEPENDENT_AMBULATORY_CARE_PROVIDER_SITE_OTHER): Payer: Self-pay | Admitting: Infectious Diseases

## 2017-12-14 DIAGNOSIS — F1120 Opioid dependence, uncomplicated: Secondary | ICD-10-CM

## 2017-12-14 DIAGNOSIS — G061 Intraspinal abscess and granuloma: Secondary | ICD-10-CM

## 2017-12-14 DIAGNOSIS — Z789 Other specified health status: Secondary | ICD-10-CM | POA: Insufficient documentation

## 2017-12-14 DIAGNOSIS — B182 Chronic viral hepatitis C: Secondary | ICD-10-CM

## 2017-12-14 NOTE — Assessment & Plan Note (Addendum)
She appears to be doing well Will repeat her BCx today as well as her CRP, ESR.  Continue off anbx Will see her back in 6 weeks.

## 2017-12-14 NOTE — Assessment & Plan Note (Signed)
She needs Hep A vax Pharmacy will meet with her to get her started on therapy Will check her fibrosure.

## 2017-12-14 NOTE — Assessment & Plan Note (Signed)
Appreciate Dr Doristine Section f/u of pt.  She is doing very well and is grateful.

## 2017-12-14 NOTE — Progress Notes (Signed)
   Subjective:    Patient ID: [name], female    DOB: [birth year], 52 y.o.   MRN: [id number]  HPI 52 y.o. female with history of IV drug use and hepatitis C (1a), initially presented 91-15 with back pain and spasms.  She was evaluated and was prescribed Keflex based on an abnormal UA though she had 0 symptoms consistent with a urinary tract infection other than the back pain.  She was discharged on Keflex and doxycycline.  Not clear whether she took any of these.  She returned to ED on 11-17 with worsening back pain and increased somnolence initially thought due to Ativan potentially.   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   Blood cultures grew MSSA persistently (BCx+ 11-17, 11-18, 11-19, 11-21, 11-23) and rifampin was added to her ancef. She was seen by neurosurgery and was taken to OR on 11-21 for C3-7 laminectomy and decompression. She also had arthrodesis C3-7, posterior screws.  She had TEE on 11-26 that did not show vegetations.  She was treated with ancef/rifampin til 12-3 when she was changed to Nafcillin/rifampin.  She was d/c home on 11-14-17 on doxy for an additional 30 days (to prevent her being d/c with a PIC).     In hospital, nursing staff found her with 1 syringe with clear yellow fluid and and a needle attached with it along with a tourniquet that was wrapped in a towel that the patient had been guarding. Security searched room and fuond 2 additional syringes. Narcan was found bu the syringe with yellow fluid looked suspicious for narcotic.  She was started on suboxone and has had f/u with Dr Daryll Drown.  She completed doxy 12-13-17.  Has been feeling well. No fever or chills.  Has been doing well with suboxonne. Doesn't want drugs, have cravings, does not reminisce about her drug use. Her family relationships are much better.  Has court date on  March 4 to complete her legal issues.   ESR 47 (09-13-17) CRP 1.2 (10-12-17)  Review of Systems  Constitutional: Negative for appetite change, chills, fever and unexpected weight change.  Gastrointestinal: Negative for constipation and diarrhea.  Genitourinary: Negative for difficulty urinating.  Musculoskeletal: Positive for neck pain.  Please see HPI. All other systems reviewed and negative. Doesn't want flu shot.      Objective:   Physical Exam  Constitutional: She appears well-developed and well-nourished.  HENT:  Mouth/Throat: No oropharyngeal exudate.  Eyes: EOM are normal. Pupils are equal, round, and reactive to light. No scleral icterus.  Neck: Neck supple.    Lymphadenopathy:    She has no cervical adenopathy.       Assessment & Plan:

## 2017-12-15 LAB — COMPREHENSIVE METABOLIC PANEL
AG Ratio: 1 (calc) (ref 1.0–2.5)
ALBUMIN MSPROF: 3.8 g/dL (ref 3.6–5.1)
ALT: 32 U/L — ABNORMAL HIGH (ref 6–29)
AST: 38 U/L — AB (ref 10–35)
Alkaline phosphatase (APISO): 89 U/L (ref 33–115)
BUN: 12 mg/dL (ref 7–25)
CHLORIDE: 105 mmol/L (ref 98–110)
CO2: 25 mmol/L (ref 20–32)
CREATININE: 0.64 mg/dL (ref 0.50–1.10)
Calcium: 9.2 mg/dL (ref 8.6–10.2)
GLOBULIN: 3.7 g/dL (ref 1.9–3.7)
GLUCOSE: 80 mg/dL (ref 65–99)
POTASSIUM: 4.1 mmol/L (ref 3.5–5.3)
Sodium: 139 mmol/L (ref 135–146)
Total Bilirubin: 0.3 mg/dL (ref 0.2–1.2)
Total Protein: 7.5 g/dL (ref 6.1–8.1)

## 2017-12-15 LAB — CBC
HCT: 34.4 % — ABNORMAL LOW (ref 35.0–45.0)
Hemoglobin: 11.6 g/dL — ABNORMAL LOW (ref 11.7–15.5)
MCH: 28.8 pg (ref 27.0–33.0)
MCHC: 33.7 g/dL (ref 32.0–36.0)
MCV: 85.4 fL (ref 80.0–100.0)
MPV: 9.1 fL (ref 7.5–12.5)
PLATELETS: 383 10*3/uL (ref 140–400)
RBC: 4.03 10*6/uL (ref 3.80–5.10)
RDW: 11.8 % (ref 11.0–15.0)
WBC: 5.8 10*3/uL (ref 3.8–10.8)

## 2017-12-15 LAB — SEDIMENTATION RATE: SED RATE: 55 mm/h — AB (ref 0–20)

## 2017-12-15 LAB — C-REACTIVE PROTEIN: CRP: 4 mg/L (ref <8.0)

## 2017-12-16 ENCOUNTER — Other Ambulatory Visit (HOSPITAL_COMMUNITY): Payer: Self-pay | Admitting: Neurosurgery

## 2017-12-16 DIAGNOSIS — M4012 Other secondary kyphosis, cervical region: Secondary | ICD-10-CM

## 2017-12-16 LAB — HEPATITIS C RNA QUANTITATIVE
HCV QUANT LOG: 5.78 {Log_IU}/mL — AB
HCV RNA, PCR, QN: 603000 IU/mL — ABNORMAL HIGH

## 2017-12-17 LAB — TIQ-NTM

## 2017-12-17 LAB — LIVER FIBROSIS, FIBROTEST-ACTITEST
ALPHA-2-MACROGLOBULIN: 140 mg/dL (ref 106–279)
ALT: 33 U/L — ABNORMAL HIGH (ref 6–29)
Apolipoprotein A1: 159 mg/dL (ref 101–198)
BILIRUBIN: 0.2 mg/dL (ref 0.2–1.2)
FIBROSIS SCORE: 0.04
GGT: 33 U/L (ref 3–55)
HAPTOGLOBIN: 179 mg/dL (ref 43–212)
NECROINFLAMMAT ACT SCORE: 0.12
REFERENCE ID: 2343036

## 2017-12-20 LAB — CULTURE, BLOOD (SINGLE)
MICRO NUMBER:: 90218406
MICRO NUMBER:: 90218419
Result:: NO GROWTH
Result:: NO GROWTH
SPECIMEN QUALITY:: ADEQUATE
SPECIMEN QUALITY:: ADEQUATE

## 2017-12-21 ENCOUNTER — Ambulatory Visit (INDEPENDENT_AMBULATORY_CARE_PROVIDER_SITE_OTHER): Payer: Self-pay | Admitting: Internal Medicine

## 2017-12-21 DIAGNOSIS — M542 Cervicalgia: Secondary | ICD-10-CM

## 2017-12-21 DIAGNOSIS — B182 Chronic viral hepatitis C: Secondary | ICD-10-CM

## 2017-12-21 DIAGNOSIS — F1120 Opioid dependence, uncomplicated: Secondary | ICD-10-CM

## 2017-12-21 MED ORDER — BUPRENORPHINE HCL-NALOXONE HCL 8-2 MG SL FILM: 1.0000 | ORAL_FILM | Freq: Two times a day (BID) | SUBLINGUAL | 0 refills | Status: DC

## 2017-12-21 NOTE — Patient Instructions (Addendum)
Joelene Millin - -  Please come back to see [place] in 2 weeks.   Please let us know the plans going forward for your neck surgery.   Thank you!

## 2017-12-21 NOTE — Progress Notes (Signed)
   12/24/2017  [name] presents for follow up of opioid use disorder I have reviewed the prior induction visit, follow up visits, and telephone encounters relevant to opiate use disorder (OUD) treatment.   Current daily dose: 16mg  daily  Date of Induction: 11/12/17  Current follow up interval, in weeks: 1 week  The patient has been adherent with the buprenorphine for OUD contract.   Last UDS Result: buprenorphine and metabolite  HPI: Ms. Chard is a 52yo woman with OUD who presents for OBOT.  She is a little tearful today.  Has been having progressive neck pain and on follow up with her neurosurgeon, they think she has a screw which is out of place and loose.  She is likely going to need more surgery, is due for a CT scan of the neck.  The surgeon knows her situation, but is positive she will need short acting narcotics post op.  We discussed that there are multiple ways to handle this situation including continuing on buprenorphine, but possibly needing higher doses of narcotics post op.  Stopping buprenorphine and treating pain - with plan to re-induce on buprenorphine in the future or switching to methadone.  She would prefer the 2nd of the 3 plans.  She is concerned that we would no longer see her, but I advised her that we would coordinate with her.  We need to know her date of surgery, planned recovery period, and then see her for re-induction 2 - 4 weeks post op depending on how she is doing.  She was very relieved to hear this.    She has had no cravings, she has been taking aleve.  She does note that since the pain has gotten worse, she has thought about taking an oxycodone, but has refrained.  She has used voltaren gel, but heat and ice work best.  We discussed a trial of other medications (Rx strength NSAID, muscle relaxer), but she was not interested at this time.    ROS: + neck pain, no cravings, no withdrawal symptoms  Exam:   Vitals:   12/21/17 1059  BP: 137/79    Pulse: 71  Temp: 98.6 F (37 C)  TempSrc: Oral  SpO2: 95%  Weight: 253 lb 3.2 oz (114.9 kg)  Height: 5' 6.5" (1.689 m)    General: Awake, alert, pleasant Neck: In neck brace Pulm: Breathing comfortably on room air, no audible wheezing Skin: Clear, no wounds, no track marks Psych: Tearful at times, but appropriate.  Assessment/Plan:  See Problem Based Charting in the Encounters Tab     Sid Falcon, MD  12/24/2017  2:15 PM

## 2017-12-22 ENCOUNTER — Ambulatory Visit (HOSPITAL_COMMUNITY)
Admission: RE | Admit: 2017-12-22 | Discharge: 2017-12-22 | Disposition: A | Discharge status: Home / Self Care | Payer: Self-pay | Source: Ambulatory Visit | Attending: Neurosurgery | Admitting: Neurosurgery

## 2017-12-22 DIAGNOSIS — Y838 Other surgical procedures as the cause of abnormal reaction of the patient, or of later complication, without mention of misadventure at the time of the procedure: Secondary | ICD-10-CM | POA: Insufficient documentation

## 2017-12-22 DIAGNOSIS — T84226A Displacement of internal fixation device of vertebrae, initial encounter: Secondary | ICD-10-CM | POA: Insufficient documentation

## 2017-12-22 DIAGNOSIS — M4012 Other secondary kyphosis, cervical region: Secondary | ICD-10-CM | POA: Insufficient documentation

## 2017-12-22 DIAGNOSIS — M4312 Spondylolisthesis, cervical region: Secondary | ICD-10-CM | POA: Insufficient documentation

## 2017-12-22 DIAGNOSIS — M1288 Other specific arthropathies, not elsewhere classified, other specified site: Secondary | ICD-10-CM | POA: Insufficient documentation

## 2017-12-24 ENCOUNTER — Encounter: Payer: Self-pay | Admitting: Internal Medicine

## 2017-12-24 NOTE — Assessment & Plan Note (Signed)
She is doing well today.  Tearful about upcoming surgery, but doing well off of narcotics and on buprenorphine.  She reports no relapse or use of illicit substances.   Plan Continue suboxone 8mg  BID UDS at next visit in 2 weeks [place] narcotic database appropriate.

## 2017-12-24 NOTE — Assessment & Plan Note (Signed)
Recently followed up with Dr. Johnnye Sima and plan is to get her started on therapy for this issue.

## 2017-12-24 NOTE — Assessment & Plan Note (Signed)
She has recent imaging which suggests part of her hardware is out of place.  She is following with neurosurgery but will likely need more surgery.  Will plan to coordinate with her to likely re-induce on buprenorphine in the post op setting.

## 2017-12-29 ENCOUNTER — Telehealth: Payer: Self-pay | Admitting: General Practice

## 2017-12-29 NOTE — Telephone Encounter (Signed)
Bonnita Nasuti - -  Can you call and find a good time to speak with him?  I presume he is not available at any time.    I will be available all morning tomorrow.  Will be in a meeting from [place] in the afternoon.   Thanks

## 2017-12-29 NOTE — Telephone Encounter (Signed)
rtc to dr Thom Chimes office, spoke w/ his PA vinnie costello, he states pt will likely have surg in the next 30 days, they are working that out at the moment. They will manage her postop pain with steroids, non inflammatory meds and muscle relaxants. He will call the direct triage ph and inform me of when the exact date will be and what the plan is. I will notify dr's vincent and hoffman since you will be away at that time. Called pt and informed her of conversation w/ PA, she states that dr Diamantina Monks told her he was not planning on surg for at least 30 days because there was some slight improvement, he had told her per pt that he was willing to give her some low dose narcotics with your approval until the decision is made, I have suggested that she call the office back and have the nurse speak to Dr and PA and as I stated they have the triage ph#, she was agreeable

## 2017-12-29 NOTE — Telephone Encounter (Signed)
PT CALLED, WOULD LIKE DR. McMullen, AT [phone] TO DISCUSS PAIN MEDICATION FOR HER

## 2017-12-29 NOTE — Telephone Encounter (Signed)
Thank you!  Dr. Evette Doffing and I discussed today and the plan from our standpoint would be:   Hold buprenorphine the evening before surgery.  We are okay with low dose short acting narcotics in the post op period if that is the preference of neurosurgery (if they have another preference, we would be happy to continue buprenorphine and continue follow up in the clinic as planned once she is discharged) with plans to re-induce [name] once she is back in clinic post op.    Let us know which is preferred by the neurosurgery team and we will help facilitate.  Thank you!

## 2017-12-30 ENCOUNTER — Telehealth: Payer: Self-pay

## 2017-12-30 NOTE — Telephone Encounter (Signed)
Would like Helen to call back.  

## 2017-12-30 NOTE — Telephone Encounter (Signed)
Rtc, she is reassured that it is fine for dr Diamantina Monks to call office, dr Daryll Drown left instructions

## 2018-01-04 ENCOUNTER — Ambulatory Visit (INDEPENDENT_AMBULATORY_CARE_PROVIDER_SITE_OTHER): Payer: Self-pay | Admitting: Student in an Organized Health Care Education/Training Program

## 2018-01-04 DIAGNOSIS — F419 Anxiety disorder, unspecified: Secondary | ICD-10-CM

## 2018-01-04 DIAGNOSIS — Z9889 Other specified postprocedural states: Secondary | ICD-10-CM

## 2018-01-04 DIAGNOSIS — M542 Cervicalgia: Secondary | ICD-10-CM

## 2018-01-04 DIAGNOSIS — F339 Major depressive disorder, recurrent, unspecified: Secondary | ICD-10-CM

## 2018-01-04 DIAGNOSIS — G8929 Other chronic pain: Secondary | ICD-10-CM

## 2018-01-04 DIAGNOSIS — Z981 Arthrodesis status: Secondary | ICD-10-CM

## 2018-01-04 DIAGNOSIS — F1120 Opioid dependence, uncomplicated: Secondary | ICD-10-CM

## 2018-01-04 MED ORDER — OXYCODONE HCL 5 MG PO TABS: 5.0000 mg | ORAL_TABLET | Freq: Two times a day (BID) | ORAL | 0 refills | Status: DC | PRN

## 2018-01-04 MED ORDER — BUPRENORPHINE HCL-NALOXONE HCL 8-2 MG SL FILM: 1.0000 | ORAL_FILM | Freq: Two times a day (BID) | SUBLINGUAL | 0 refills | Status: DC

## 2018-01-04 NOTE — Progress Notes (Signed)
   01/04/2018  [name] presents for follow up of opioid use disorder I have reviewed the prior induction visit, follow up visits, and telephone encounters relevant to opiate use disorder (OUD) treatment.   Current daily dose: 8 mg twice daily  Date of Induction: 11/12/2017  Current follow up interval, in weeks: 2  The patient has been adherent with the buprenorphine for OUD contract.   Last UDS Result: Appropriate  HPI: 52 year old woman here for follow-up of severe opioid use disorder.  She reports good compliance with Suboxone at home which is managing her cravings well.  Denies any relapses recently.  She is accompanied by her sister today.  [name] lives with her sister and to their family on the couch.  [name] is quite beside herself today with worsening neck pain.  She reports over the last few weeks the pain is becoming almost unbearable.  She denies having any fevers or chills, no night sweats.  Denies any weakness in her upper or lower extremities.  Denies any bowel or bladder dysfunction.  She has been following up closely with her neurosurgeon, the plan is to repeat's plain film x-rays of her cervical spine in early April and then make a decision about possible surgery to correct any loosening hardware.  Exam:   Vitals:   01/04/18 1109  BP: (!) 160/96  Pulse: 85  Resp: (!) 22  Temp: 98.6 F (37 C)  TempSrc: Oral  SpO2: 98%  Weight: 262 lb 11.2 oz (119.2 kg)  Height: 5' 6.5" (1.689 m)    General: Anxious appearing woman, very tearful, in a cervical collar. Ext: No LE edema Neuro: Alert and oriented, conversational, normal strength in the upper and lower extremities Psych: Very tearful at first, then calmed down, well groomed and put together otherwise.   Assessment/Plan:  See Problem Based Charting in the Encounters Tab    Axel Filler, MD  01/04/2018  2:11 PM

## 2018-01-04 NOTE — Assessment & Plan Note (Signed)
Severe opioid use disorder currently stable and well controlled on Suboxone 8 mg twice daily.  Plan is to continue with Suboxone and I gave her a 2-week supply.  Urine drug testing has been appropriate showing excellent compliance and good remission.  The acute pain generator with her neck pain might put her at risk for relapse, we will have to see.  Follow-up with me closely in 2 weeks.

## 2018-01-04 NOTE — Assessment & Plan Note (Signed)
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  This was managed surgically with cervical spine laminectomy and fusion of 4 cervical vertebrae.  I think her worsening neck pain over the last few weeks is a high risk feature.  She is convinced that this is due to the loosening hardware, but I am not so sure.  I am more worried about recurrence of osteomyelitis or discitis.  Thankfully she has no signs of fevers or chills or other symptoms.  Will try to support her through this acute pain by adding oxycodone 5 mg to be taken twice a day.  We talked about having reasonable expectations given her level of chronic pain, opioid tolerance, and contributions of depression and anxiety.  I think if the pain worsens or if she starts to show symptoms of possible infection that we should repeat an MRI cervical spine.  I also talked with her neurosurgeons office today and told him that we would be happy to manage her Suboxone and other opioids if they do plan to do any further surgery. They will follow-up with her in early April.

## 2018-01-18 ENCOUNTER — Ambulatory Visit (INDEPENDENT_AMBULATORY_CARE_PROVIDER_SITE_OTHER): Payer: Self-pay | Admitting: Student in an Organized Health Care Education/Training Program

## 2018-01-18 VITALS — BP 130/80 | HR 66 | Temp 98.2°F | Resp 20 | Wt 257.2 lb

## 2018-01-18 DIAGNOSIS — F1120 Opioid dependence, uncomplicated: Secondary | ICD-10-CM

## 2018-01-18 DIAGNOSIS — B182 Chronic viral hepatitis C: Secondary | ICD-10-CM

## 2018-01-18 DIAGNOSIS — M542 Cervicalgia: Secondary | ICD-10-CM

## 2018-01-18 MED ORDER — BUPRENORPHINE HCL-NALOXONE HCL 8-2 MG SL FILM: 1.0000 | ORAL_FILM | Freq: Two times a day (BID) | SUBLINGUAL | 0 refills | Status: DC

## 2018-01-18 MED ORDER — OXYCODONE HCL 5 MG PO TABS: 5.0000 mg | ORAL_TABLET | Freq: Two times a day (BID) | ORAL | 0 refills | Status: DC | PRN

## 2018-01-18 NOTE — Assessment & Plan Note (Signed)
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  Patient has been doing well since discharge with no relapses in over 2 months.  Current dosing of Suboxone has been managing her withdrawal and cravings.  Plan is to continue with Suboxone 16 mg once daily.  Checking urine drug testing today.  I checked the controlled substance reporting system which was appropriate.  Follow-up with me in 2 weeks.  Sounds like her social situation is stable.  I think the biggest risk to her remission is this flare of chronic neck pain and the possibility she might have to undergo another surgery.

## 2018-01-18 NOTE — Assessment & Plan Note (Addendum)
Acute on chronic neck pain is stabilized since I saw her last 2 weeks.  She has a history of cervical osteomyelitis treated with an extended course of IV antibiotics.  Last time I saw her I was worried that this worsening neck pain was reemerging infection.  I am reassured today that the pain is improving and continues to have no other signs of infection like fevers or night sweats.  She had a follow-up with her surgeon next week to talk about utility of any further surgeries, I am hopeful they will not have to reoperate.  I am more suspicious that this is a flare of chronic pain provoked by comorbid depression, anxiety, and the opioid use disorder.  Plan is for another 2-week supply of oxycodone 10 mg once daily to help increase her functional status from this pain, but patient is aware that this is not a long-term solution.

## 2018-01-18 NOTE — Progress Notes (Signed)
   01/18/2018  [name] presents for follow up of opioid use disorder I have reviewed the prior induction visit, follow up visits, and telephone encounters relevant to opiate use disorder (OUD) treatment.   Current daily dose: Suboxone 16 mg daily  Date of Induction: 11/12/2017  Current follow up interval, in weeks: 2  The patient has been adherent with the buprenorphine for OUD contract.   Last UDS Result: Appropriate   HPI: 52 year old woman here for follow-up of opioid use disorder.  She has a history of complicated cervical spine osteomyelitis managed with surgery and prolonged IV antibiotics.  So she had a flare of neck pain which she attributes to loosening screw from the fixation surgery.  Reports normal strength in upper and lower extremities both returns to stop to wear she denies recurrent fevers or chills.  She lives at her sister's house and reports that it is supportive environment, no one around her uses drugs.  Denies fevers or chills no bowel or bladder dysfunction.  No other neurologic symptoms.  Exam:   Vitals:   01/18/18 1108  BP: 130/80  Pulse: 66  Resp: 20  Temp: 98.2 F (36.8 C)  TempSrc: Oral  SpO2: 98%  Weight: 257 lb 3.2 oz (116.7 kg)    General: Well-appearing woman sitting up in a chair in a neck immobilizer Neuro: Alert and oriented, conversational, full strength in upper and lower extremities, normal gait Extremities: Warm well perfused with no lower extremity edema Psych: Mildly tearful today, appropriate affect, well put together  Assessment/Plan:  See Problem Based Charting in the Encounters Tab     Axel Filler, MD  01/18/2018  2:33 PM

## 2018-01-18 NOTE — Assessment & Plan Note (Signed)
Hepatitis C infection with genotype 1A, positive viral load mild increased transaminases. Her opioid use disorder is now well controlled on treatment, she is an excellent candidate for treatments.  We will have to apply for pharmaceutical company assistance as she is uninsured.  We can start this process in the coming weeks.  He was recently arranged to follow-up with the ID clinic and Dr. Johnnye Sima, but she for some reason is canceled that appointment.

## 2018-01-23 LAB — TOXASSURE SELECT,+ANTIDEPR,UR

## 2018-01-24 ENCOUNTER — Telehealth: Payer: Self-pay | Admitting: Infectious Diseases

## 2018-01-24 NOTE — Telephone Encounter (Signed)
error 

## 2018-02-01 ENCOUNTER — Ambulatory Visit (INDEPENDENT_AMBULATORY_CARE_PROVIDER_SITE_OTHER): Payer: Self-pay | Admitting: Internal Medicine

## 2018-02-01 VITALS — BP 145/90 | HR 79 | Temp 98.0°F | Resp 20 | Wt 256.6 lb

## 2018-02-01 DIAGNOSIS — Z8739 Personal history of other diseases of the musculoskeletal system and connective tissue: Secondary | ICD-10-CM

## 2018-02-01 DIAGNOSIS — F1120 Opioid dependence, uncomplicated: Secondary | ICD-10-CM

## 2018-02-01 DIAGNOSIS — Z792 Long term (current) use of antibiotics: Secondary | ICD-10-CM

## 2018-02-01 DIAGNOSIS — M542 Cervicalgia: Secondary | ICD-10-CM

## 2018-02-01 DIAGNOSIS — B182 Chronic viral hepatitis C: Secondary | ICD-10-CM

## 2018-02-01 DIAGNOSIS — Z9889 Other specified postprocedural states: Secondary | ICD-10-CM

## 2018-02-01 MED ORDER — BUPRENORPHINE HCL-NALOXONE HCL 8-2 MG SL FILM: 1.0000 | ORAL_FILM | Freq: Two times a day (BID) | SUBLINGUAL | 0 refills | Status: DC

## 2018-02-01 MED ORDER — OXYCODONE HCL 5 MG PO TABS: 5.0000 mg | ORAL_TABLET | Freq: Two times a day (BID) | ORAL | 0 refills | Status: DC | PRN
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
## 2018-02-01 NOTE — Patient Instructions (Signed)
I want you to come back and see Dr Evette Doffing in 2 weeks.

## 2018-02-04 NOTE — Assessment & Plan Note (Signed)
We will try to get [facility] records, from her report it sounds like she is doing better, I did give her a limited fill of oxycodone for her neck pain,  I instructed this will not likely continue after this fill as we will need to find other options if she remains in pain.

## 2018-02-04 NOTE — Assessment & Plan Note (Signed)
Great candidate for treatment, it appears U/S with elastography  has already been ordered by Dr Johnnye Sima.  Hopefully we will be able to get this treated once she completes this next step.

## 2018-02-04 NOTE — Assessment & Plan Note (Signed)
We discusssed the inappropriate UDS, overall I think [name] is doing well, will have her continue with 16mg  of suboxone daily and 2 week follow up.

## 2018-02-15 ENCOUNTER — Encounter: Payer: Self-pay | Admitting: Student in an Organized Health Care Education/Training Program

## 2018-02-15 ENCOUNTER — Ambulatory Visit (INDEPENDENT_AMBULATORY_CARE_PROVIDER_SITE_OTHER): Payer: Self-pay | Admitting: Student in an Organized Health Care Education/Training Program

## 2018-02-15 VITALS — BP 150/88 | HR 78 | Temp 98.0°F | Resp 20 | Wt 258.4 lb

## 2018-02-15 DIAGNOSIS — F1120 Opioid dependence, uncomplicated: Secondary | ICD-10-CM

## 2018-02-15 DIAGNOSIS — B182 Chronic viral hepatitis C: Secondary | ICD-10-CM

## 2018-02-15 DIAGNOSIS — M542 Cervicalgia: Secondary | ICD-10-CM

## 2018-02-15 DIAGNOSIS — M4622 Osteomyelitis of vertebra, cervical region: Secondary | ICD-10-CM

## 2018-02-15 DIAGNOSIS — Z981 Arthrodesis status: Secondary | ICD-10-CM

## 2018-02-15 DIAGNOSIS — Z978 Presence of other specified devices: Secondary | ICD-10-CM

## 2018-02-15 MED ORDER — BUPRENORPHINE HCL-NALOXONE HCL 8-2 MG SL FILM: 1.0000 | ORAL_FILM | Freq: Three times a day (TID) | SUBLINGUAL | 0 refills | Status: DC

## 2018-02-15 NOTE — Progress Notes (Signed)
   02/15/2018  [name] presents for follow up of opioid use disorder I have reviewed the prior induction visit, follow up visits, and telephone encounters relevant to opiate use disorder (OUD) treatment.   Current daily dose: Suboxone 16 mg daily  Date of Induction: 11/12/2017  Current follow up interval, in weeks: 2  The patient has been adherent with the buprenorphine for OUD contract.   Last UDS Result: Appropriate for bupe and metabolite, appropriate for oxycodone,   HPI: 52 year old woman here for follow-up of severe opioid use disorder which was complicated by cervical spine osteomyelitis. Patient is doing well on treatment with Suboxone 16 mg once daily. Patient is doing well on treatment with Suboxone 16 mg once daily.  She reports her cravings are well controlled.  We started her on oxycodone as well because of increased neck pain due to the cervical osteomyelitis.  This is improved some over the last few weeks.  She had a lot of stress about the possibility that 1 of the surgical screws from fixation was coming loose.  She tells me that she saw her surgeon and there are no plans for repeat surgery in the near future.  She is agreeable to coming off oxycodone says that it does not help her that much.  She is asking if we can increase the dose of the Suboxone to try to help with her pain component, which I think is reasonable.  She still lives with her sister, reports that she has her own room now.  Says that it is a safe and stable living situation.  Reports that her mood is been stable.  No fevers or chills.  Exam:   Vitals:   02/15/18 1139  BP: (!) 150/88  Pulse: 78  Resp: 20  Temp: 98 F (36.7 C)  TempSrc: Oral  SpO2: 97%  Weight: 258 lb 6.4 oz (117.2 kg)    General: Well-appearing woman, sitting up in a chair, immobilized neck brace Extremities: Warm and well perfused with no lower extremity edema, no joint effusion Skin: No rash or skin or soft tissue  infection Neuro: Alert and oriented, conversational, strength in upper and lower extremities, normal gait Psychiatric: Normal affect today, less tearful, not anxious or depressed appearing, pleasant and well put together  Assessment/Plan:  See Problem Based Charting in the Encounters Tab    Axel Filler, MD  02/15/2018  12:04 PM

## 2018-02-15 NOTE — Assessment & Plan Note (Addendum)
Opioid use disorder is stable on treatment with Suboxone. Because of increased chronic pain we are going to increase Suboxone to 24 mg daily in divided doses. Cravings well controlled. No major relapse, one slip up with a fentanyl patch.  We will repeat urine drug test today.  She is attending NA meetings at least weekly which is great.  Plan to follow-up with [place] in 2 weeks, very close to being able to go out to every 4-week visits.

## 2018-02-15 NOTE — Patient Instructions (Addendum)
1. Keep up the great work. We can increase the suboxone to 3 films a day to help with your neck pain.  2. We will submit your application for the assistance program for hepatitis C treatment

## 2018-02-15 NOTE — Assessment & Plan Note (Signed)
Chronic hepatitis C infection, fibrosis score 0.  Patient is a good candidate for treatments.  OUD is now well controlled.  Only barrier is uninsured status. Would treat with Mavyret for 8 weeks, grade 1a recommendation. Will submit application for pharmaceutical assistance program.

## 2018-02-15 NOTE — Assessment & Plan Note (Signed)
Neck pain is symptomatically much improved.  No further evidence of recurrence of osteomyelitis or infection.  She followed up with her surgeon is recommended continued supportive care, no surgeries planned.  Pain is improved enough that I think we can discontinue oxycodone.

## 2018-02-22 LAB — TOXASSURE SELECT,+ANTIDEPR,UR

## 2018-02-23 ENCOUNTER — Telehealth: Payer: Self-pay | Admitting: Pharmacy Technician

## 2018-02-23 NOTE — Telephone Encounter (Signed)
Must bring or fax back no income letter with sister's signature to be able to get Hepatitis C medication through [facility]

## 2018-03-01 ENCOUNTER — Ambulatory Visit (INDEPENDENT_AMBULATORY_CARE_PROVIDER_SITE_OTHER): Payer: Self-pay | Admitting: Internal Medicine

## 2018-03-01 VITALS — BP 150/96 | HR 80 | Temp 98.5°F | Wt 248.9 lb

## 2018-03-01 DIAGNOSIS — F1120 Opioid dependence, uncomplicated: Secondary | ICD-10-CM

## 2018-03-01 DIAGNOSIS — M542 Cervicalgia: Secondary | ICD-10-CM

## 2018-03-01 DIAGNOSIS — M4622 Osteomyelitis of vertebra, cervical region: Secondary | ICD-10-CM

## 2018-03-01 MED ORDER — BUPRENORPHINE HCL-NALOXONE HCL 8-2 MG SL FILM: 1.0000 | ORAL_FILM | Freq: Three times a day (TID) | SUBLINGUAL | 0 refills | Status: DC
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
## 2018-03-02 NOTE — Assessment & Plan Note (Signed)
-  Relapse noted, will obtain UDS, however plan to continue her on suboxone 24mg  daily (1 film TID).

## 2018-03-02 NOTE — Assessment & Plan Note (Signed)
Improved recently on suboxone 24mg  daily

## 2018-03-06 LAB — TOXASSURE SELECT,+ANTIDEPR,UR

## 2018-03-15 ENCOUNTER — Ambulatory Visit (INDEPENDENT_AMBULATORY_CARE_PROVIDER_SITE_OTHER): Payer: Self-pay | Admitting: Internal Medicine

## 2018-03-15 DIAGNOSIS — F1120 Opioid dependence, uncomplicated: Secondary | ICD-10-CM

## 2018-03-15 DIAGNOSIS — Z8739 Personal history of other diseases of the musculoskeletal system and connective tissue: Secondary | ICD-10-CM

## 2018-03-15 MED ORDER — BUPRENORPHINE HCL-NALOXONE HCL 8-2 MG SL FILM: 1.0000 | ORAL_FILM | Freq: Three times a day (TID) | SUBLINGUAL | 0 refills | Status: DC

## 2018-03-15 NOTE — Assessment & Plan Note (Signed)
I discussed that I was proud of her for all of her honesty and I am sure that her urine will be appropriate however I decided that I will defer this today.  We will plan to probably check it at her next visit. -We will keep her for now at the 2-week follow-up interval and see her back in 2 weeks continue Suboxone 24 mg daily in divided doses.

## 2018-03-15 NOTE — Progress Notes (Signed)
   03/15/2018  [name] presents for follow up of opioid use disorder I have reviewed the prior induction visit, follow up visits, and telephone encounters relevant to opiate use disorder (OUD) treatment.   Current daily dose: Suboxone 24 mg daily  Date of Induction: 11/12/2017  Current follow up interval, in weeks: 2  The patient has been adherent with the buprenorphine for OUD contract.   Last UDS Result: inappropriate  HPI: 52 year old woman here for follow-up of opioid use disorder.  She has a history of complicated cervical spine osteomyelitis managed with surgery and prolonged IV antibiotics.  She is very upbeat today she reports she is doing well she actually requested for [place] to check her urine because she is will show only the Suboxone present.  She notes that her neck pain has been well controlled the best it has been since the onset of symptoms.  She thinks that the most recent relapse was a real eye-opener of how easy it is to fall back into bad habits and she is more motivated than ever to adhere to MAT. Exam:   Vitals:   03/15/18 1032  BP: 135/90  Pulse: 64  Resp: 20  Temp: 98.3 F (36.8 C)  TempSrc: Oral  SpO2: 98%  Weight: 250 lb 14.4 oz (113.8 kg)    General: Well-appearing woman sitting up in a chair in a neck immobilizer Neuro: Alert and oriented, conversational  Extremities: Warm well perfused with no lower extremity edema Psych: appropriate affect, appears to be in very good mood.  Assessment/Plan:  See Problem Based Charting in the Encounters Tab     Virginia Groves, DO  03/15/2018  10:36 AM

## 2018-03-16 ENCOUNTER — Inpatient Hospital Stay: Payer: Self-pay | Admitting: Infectious Diseases

## 2018-03-29 ENCOUNTER — Ambulatory Visit (INDEPENDENT_AMBULATORY_CARE_PROVIDER_SITE_OTHER): Payer: Self-pay | Admitting: Student in an Organized Health Care Education/Training Program

## 2018-03-29 VITALS — BP 134/63 | HR 69 | Temp 98.6°F | Wt 254.0 lb

## 2018-03-29 DIAGNOSIS — B182 Chronic viral hepatitis C: Secondary | ICD-10-CM

## 2018-03-29 DIAGNOSIS — F1120 Opioid dependence, uncomplicated: Secondary | ICD-10-CM

## 2018-03-29 MED ORDER — BUPRENORPHINE HCL-NALOXONE HCL 8-2 MG SL FILM: 1.0000 | ORAL_FILM | Freq: Three times a day (TID) | SUBLINGUAL | 0 refills | Status: DC

## 2018-03-29 NOTE — Patient Instructions (Addendum)
Please call the pharmaceutical company to follow up on your Mavyret application ([phone]). See if they need any further information from either you or our office.
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
## 2018-03-29 NOTE — Assessment & Plan Note (Signed)
Work up is complete and we submitted an [place] for Chief Strategy Officer for [employer] at the end of April. Scanned into Media tab.  I do not think our office has heard any response.  Patient thinks that the phone number she gave him is no longer in service.  I gave Gissella the contact number for the assistance program and asked that she follow up with them.  They may need more information about her finances before making a final determination.

## 2018-03-29 NOTE — Assessment & Plan Note (Signed)
She had a relapse on Sunday, used her sister's fentanyl patch because of increased neck pain after a fall.  I am a little worried about the patient's poor insight into this. She thought that licking a fentanyl patch for pain relief was justified because she had a muscle spasm. We talked about how this kind of denial is a part of the use disorder.  she is interested in making positive steps and removing triggers in her life.  We talked about how it is essential that they remove any other opioids from the house, and how she should not take any opioids given to her by non-physicians.  Plan to continue with Suboxone 3 films daily.  I ordered a urine substance test today to confirm compliance with Suboxone.  She is going to continue with counseling through [facility]arcotics Anonymous at [facility] in [place]. Close follow up in 2 weeks.

## 2018-04-02 LAB — TOXASSURE SELECT,+ANTIDEPR,UR

## 2018-04-12 ENCOUNTER — Other Ambulatory Visit: Payer: Self-pay | Admitting: *Deleted

## 2018-04-12 ENCOUNTER — Other Ambulatory Visit: Payer: Self-pay

## 2018-04-12 ENCOUNTER — Telehealth: Payer: Self-pay | Admitting: General Practice

## 2018-04-12 ENCOUNTER — Ambulatory Visit (INDEPENDENT_AMBULATORY_CARE_PROVIDER_SITE_OTHER): Payer: Self-pay | Admitting: Internal Medicine

## 2018-04-12 DIAGNOSIS — F1120 Opioid dependence, uncomplicated: Secondary | ICD-10-CM

## 2018-04-12 DIAGNOSIS — B182 Chronic viral hepatitis C: Secondary | ICD-10-CM

## 2018-04-12 MED ORDER — BUPRENORPHINE HCL-NALOXONE HCL 8-2 MG SL FILM: 1.0000 | ORAL_FILM | Freq: Three times a day (TID) | SUBLINGUAL | 0 refills | Status: DC

## 2018-04-12 NOTE — Patient Instructions (Signed)
[name] - -  Thank you for coming in to see me!  Keep up the good work of staying clean and sober.  Please come back to see me in 4 weeks.  If you are having trouble in the interim, please come in to see [place] earlier.    Thank you!

## 2018-04-12 NOTE — Assessment & Plan Note (Signed)
Continuing to await information about assistance program for therapy.  She has not heard anything.
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
## 2018-04-12 NOTE — Telephone Encounter (Signed)
[facility] are [facility] the [facility] [place] [facility]

## 2018-04-12 NOTE — Telephone Encounter (Signed)
[phone] patient would like to a nurse to call when the prescription is sent so she can go pickup

## 2018-04-12 NOTE — Assessment & Plan Note (Signed)
She has some better insight today into her social interactions leading to relapse.  She knows that she needs to cut ties from friends/family who continue to use illicit substances, however, this is difficult for her.  She will continue to go to NA.  She has well controlled withdrawal on current dose of suboxone.  She would like to spread out her visits with [place] to 4 weeks.  I advised her that we could give it a trial, however, she is at risk for continued relapse and should call the clinic for assistance if she is not doing well.    Last UDS did show buprenorphine, she reports taking.   Plan U tox today [place] narcotic database reviewed and appropriate Suboxone 8-2mg  TID

## 2018-04-13 NOTE — Telephone Encounter (Signed)
Spoke w/ pt 6/18, script was resent

## 2018-04-17 LAB — TOXASSURE SELECT,+ANTIDEPR,UR

## 2018-05-10 ENCOUNTER — Ambulatory Visit (INDEPENDENT_AMBULATORY_CARE_PROVIDER_SITE_OTHER): Payer: Self-pay | Admitting: Internal Medicine

## 2018-05-10 ENCOUNTER — Other Ambulatory Visit: Payer: Self-pay

## 2018-05-10 VITALS — BP 131/93 | HR 69 | Temp 98.2°F | Ht 66.5 in | Wt 251.5 lb

## 2018-05-10 DIAGNOSIS — F988 Other specified behavioral and emotional disorders with onset usually occurring in childhood and adolescence: Secondary | ICD-10-CM

## 2018-05-10 DIAGNOSIS — B182 Chronic viral hepatitis C: Secondary | ICD-10-CM

## 2018-05-10 DIAGNOSIS — F329 Major depressive disorder, single episode, unspecified: Secondary | ICD-10-CM

## 2018-05-10 DIAGNOSIS — F1120 Opioid dependence, uncomplicated: Secondary | ICD-10-CM

## 2018-05-10 DIAGNOSIS — M542 Cervicalgia: Secondary | ICD-10-CM

## 2018-05-10 MED ORDER — BUPRENORPHINE HCL-NALOXONE HCL 8-2 MG SL FILM: 1.0000 | ORAL_FILM | Freq: Three times a day (TID) | SUBLINGUAL | 0 refills | Status: DC

## 2018-05-10 NOTE — Assessment & Plan Note (Signed)
The patient is doing well on suboxone 24mg  daily. She states that she has not used any substances over the past 4 weeks. She has conitnued to distance herself from stressors. [place] controlled substance database appropriate.   -Refilled suboxone 24mg  daily  -Ordered toxassure

## 2018-05-10 NOTE — Patient Instructions (Signed)
It was a pleasure to see you today Ms. Chandley. Please make the following changes:  -Please continue to use suboxone 24mg  daily  -Please follow up in 4 weeks  If you have any questions or concerns, please call our clinic at [phone] between 9am-5pm and after hours call [phone] and ask for the internal medicine resident on call. If you feel you are having a medical emergency please call 911.   Thank you, we look forward to help you remain healthy!  Lars Mage, MD Internal Medicine PGY1

## 2018-05-10 NOTE — Progress Notes (Signed)
   05/10/2018  [name] presents for follow up of opioid use disorder I have reviewed the prior induction visit, follow up visits, and telephone encounters relevant to opiate use disorder (OUD) treatment.   Current daily dose: Suboxone 24 mg daily  Date of Induction: 11/12/2017  Current follow up interval, in weeks: 4  The patient has been adherent with the buprenorphine for OUD contract.   Last UDS Result: 04/12/18 unexpected for methamphetamine, morphine, hydromorphone, methadone, cocaine, norfentanyl  HPI: [name] is a 52 year old female with chronic hepatitis C genotype 1a infection, major depressive disorder, neck pain, and attention deficit disorder presents for opioid use disorder follow-up.  The patient's ex-boyfriend seems to be a stressor for her substance use and surrounds her with narcotics and other substances, but he has not been around her. The patient mentions that they only talk on the phone.  The patient is going to AWOL (another way of life) 1-2 times per weekly. She states that it has been helpful.   The patient states that her neck pain is also much better. She was previously using a neck brace, but has not been using it as much anymore.   Exam:   Vitals:   05/10/18 1107  BP: (!) 131/93  Pulse: 69  Temp: 98.2 F (36.8 C)  TempSrc: Oral  SpO2: 97%  Weight: 251 lb 8 oz (114.1 kg)  Height: 5' 6.5" (1.689 m)    Physical Exam  Constitutional: She appears well-developed and well-nourished. No distress.  HENT:  Head: Normocephalic and atraumatic.  Eyes: Conjunctivae are normal.  Cardiovascular: Normal rate, regular rhythm and normal heart sounds.  Pulmonary/Chest: Effort normal and breath sounds normal. No respiratory distress. She has no wheezes.  Abdominal: Soft. Bowel sounds are normal. She exhibits no distension. There is no tenderness.  Musculoskeletal: She exhibits no edema.  Neurological: She is alert.  Skin: She is not diaphoretic. No  erythema.  Psychiatric: She has a normal mood and affect. Her behavior is normal. Judgment and thought content normal.   Assessment/Plan:  See Problem Based Charting in the Encounters Tab     Lars Mage, MD  05/10/2018  11:21 AM

## 2018-05-13 NOTE — Addendum Note (Signed)
Addended by: Gilles Chiquito B on: 05/13/2018 09:51 AM   Modules accepted: Level of Service

## 2018-05-13 NOTE — Progress Notes (Signed)
Internal Medicine Clinic Attending  I saw and evaluated the patient.  I personally confirmed the key portions of the history and exam documented by Dr. Maricela Bo and I reviewed pertinent patient test results.  The assessment, diagnosis, and plan were formulated together and I agree with the documentation in the resident's note.  [name] is doing well today.  She is slowly getting more stabilized and she is able to participate in important life events.  Continue to support her.  Congratulated her on her success.  Follow up in 4 weeks.

## 2018-05-15 LAB — TOXASSURE SELECT,+ANTIDEPR,UR

## 2018-06-07 ENCOUNTER — Ambulatory Visit (INDEPENDENT_AMBULATORY_CARE_PROVIDER_SITE_OTHER): Payer: Self-pay | Admitting: Student in an Organized Health Care Education/Training Program

## 2018-06-07 DIAGNOSIS — Z79899 Other long term (current) drug therapy: Secondary | ICD-10-CM

## 2018-06-07 DIAGNOSIS — F339 Major depressive disorder, recurrent, unspecified: Secondary | ICD-10-CM

## 2018-06-07 DIAGNOSIS — F1120 Opioid dependence, uncomplicated: Secondary | ICD-10-CM

## 2018-06-07 MED ORDER — GABAPENTIN 300 MG PO CAPS: 300.0000 mg | ORAL_CAPSULE | Freq: Three times a day (TID) | ORAL | 2 refills | Status: AC

## 2018-06-07 MED ORDER — BUPRENORPHINE HCL-NALOXONE HCL 8-2 MG SL FILM
1.0000 | ORAL_FILM | Freq: Three times a day (TID) | SUBLINGUAL | 0 refills | Status: DC
Start: 2018-06-07 — End: 2018-07-05

## 2018-06-07 MED ORDER — DULOXETINE HCL 30 MG PO CPEP: 30.0000 mg | ORAL_CAPSULE | Freq: Every day | ORAL | 0 refills | Status: AC

## 2018-06-07 NOTE — Progress Notes (Signed)
   06/07/2018  [name] presents for follow up of opioid use disorder I have reviewed the prior induction visit, follow up visits, and telephone encounters relevant to opiate use disorder (OUD) treatment.   Current daily dose: Suboxone 24 mg daily  Date of Induction: 11/12/2017  Current follow up interval, in weeks: 4  The patient has been adherent with the buprenorphine for OUD contract.   Last UDS Result: Appropriate other than a tiny amount of fentanyl  HPI: 52 year old woman here for follow-up of severe opioid use disorder.  Reports good compliance with Suboxone, no adverse side effects.  Says last heroin use was over 30 days ago.  Since we last saw her she has moved from [place] into [place].  Says it is much more isolated.  Living with her sister and brother-in-law.  Reports that is been very good for her to get away from people that she previously used with.  Also established with [facility] for treatment of depression, they started her on gabapentin and Cymbalta.  She is going to also establish with therapy through their office as well.  No fevers or chills.  Exam:   Vitals:   06/07/18 1048  BP: (!) 158/96  Pulse: 72  Resp: 20  Temp: 99.2 F (37.3 C)  TempSrc: Oral  SpO2: 99%  Weight: 250 lb 6.4 oz (113.6 kg)    General: Well-appearing woman no distress Cardiac: Regular rate and rhythm with no murmurs Extremities: Warm well perfused with no edema  Assessment/Plan:  See Problem Based Charting in the Encounters Tab     Axel Filler, MD  06/07/2018  11:09 AM

## 2018-06-07 NOTE — Assessment & Plan Note (Signed)
Severe opioid use disorder, doing well.  Reports good compliance with Suboxone.  Over 30 days since last heroin use.  Reports improved living situation.  Establishing with therapy through [facility] which is excellent.  Plan to continue with Suboxone 24 mg daily in divided doses.  Prescribed a 1 month supply.  Check urine substance test today to confirm compliance.  Follow-up with [place] closely in 1 month.

## 2018-06-15 LAB — TOXASSURE SELECT,+ANTIDEPR,UR

## 2018-07-05 ENCOUNTER — Other Ambulatory Visit: Payer: Self-pay | Admitting: General Practice

## 2018-07-05 DIAGNOSIS — F1120 Opioid dependence, uncomplicated: Secondary | ICD-10-CM

## 2018-07-05 MED ORDER — BUPRENORPHINE HCL-NALOXONE HCL 8-2 MG SL FILM: 1.0000 | ORAL_FILM | Freq: Three times a day (TID) | SUBLINGUAL | 0 refills | Status: DC

## 2018-07-05 NOTE — Telephone Encounter (Signed)
OUD patient needs refill on   Buprenorphine HCl-Naloxone HCl 8-2 MG FILM        Pls send meds to [facility] [address], [place], [place] [address].  Patient would like a call when meds have been sent to the pharmacy.

## 2018-07-05 NOTE — Telephone Encounter (Signed)
Will send 1 week supply with planned follow up in 1 week.  Thanks

## 2018-07-05 NOTE — Telephone Encounter (Signed)
Patient notified and is very [employer] attorney. States she will not miss next week's appt. Hubbard Hartshorn, RN, BSN

## 2018-07-12 ENCOUNTER — Ambulatory Visit (INDEPENDENT_AMBULATORY_CARE_PROVIDER_SITE_OTHER): Payer: Self-pay | Admitting: Internal Medicine

## 2018-07-12 VITALS — BP 140/92 | HR 81 | Temp 98.0°F | Wt 247.4 lb

## 2018-07-12 DIAGNOSIS — M542 Cervicalgia: Secondary | ICD-10-CM

## 2018-07-12 DIAGNOSIS — F1120 Opioid dependence, uncomplicated: Secondary | ICD-10-CM

## 2018-07-12 DIAGNOSIS — Z79899 Other long term (current) drug therapy: Secondary | ICD-10-CM

## 2018-07-12 DIAGNOSIS — G4700 Insomnia, unspecified: Secondary | ICD-10-CM | POA: Insufficient documentation

## 2018-07-12 MED ORDER — BUPRENORPHINE HCL-NALOXONE HCL 8-2 MG SL FILM: 1.0000 | ORAL_FILM | Freq: Three times a day (TID) | SUBLINGUAL | 0 refills | Status: AC

## 2018-07-12 NOTE — Progress Notes (Signed)
   07/12/2018  [name] presents for follow up of opioid use disorder I have reviewed the prior induction visit, follow up visits, and telephone encounters relevant to opiate use disorder (OUD) treatment.   Current daily dose: Suboxone 24 mg daily  Date of Induction: 11/12/2017  Current follow up interval, in weeks: 4  The patient has been adherent with the buprenorphine for OUD contract.   Last UDS Result: Appropriate for bupe, did have Lorrin Mais present  HPI: 52 year old woman here for follow-up of severe opioid use disorder.  Reports good compliance with Suboxone, no adverse side effects.  Says last heroin use was over 60 days ago.  Since we last saw her she has moved from [place] into [place].  She continues to feel that this change in living situation has benefited her.  She remains on gabapentin and Cymbalta now prescribed by psychiatry.  Overall her affect is very positive and she seems to be doing very well today. She did admit to using old [place] Rx occasionally, has not slept as well lately, trouble with racing thoughts, she does have a private room, fan on, leaves TV on to go to sleep. Exam:   Vitals:   07/12/18 1119  BP: (!) 140/92  Pulse: 81  Temp: 98 F (36.7 C)  TempSrc: Oral  SpO2: 98%  Weight: 247 lb 6.4 oz (112.2 kg)    General: Well-appearing woman no distress Cardiac: Regular rate and rhythm with no murmurs Lungs: CTAB Extremities: Warm well perfused with no edema  Assessment/Plan:  See Problem Based Charting in the Encounters Tab     Lucious Groves, DO  07/12/2018  1:25 PM

## 2018-07-12 NOTE — Assessment & Plan Note (Signed)
Overall improved, discussed importance of stretching and ROM exercises.

## 2018-07-12 NOTE — Patient Instructions (Signed)
  Practice Good Sleep Hygiene Here are some suggestions Avoid napping during the day. It can disturb the normal pattern of sleep and wakefulness.  Avoid stimulants such as caffeine, nicotine, and alcohol too close to bedtime. While alcohol is well known to speed the onset of sleep, it disrupts sleep in the second half as the body begins to metabolize the alcohol, causing arousal.  Exercise can promote good sleep. Vigorous exercise should be taken in the morning or late afternoon. A relaxing exercise, like yoga, can be done before bed to help initiate a restful night's sleep. Food can be disruptive right before sleep. Stay away from large meals close to bedtime. Also dietary changes can cause sleep problems, if someone is struggling with a sleep problem, it's not a good time to start experimenting with spicy dishes. And, remember, chocolate has caffeine.  Ensure adequate exposure to natural light. This is particularly important for older people who may not venture outside as frequently as children and adults. Light exposure helps maintain a healthy sleep-wake cycle.  Establish a regular relaxing bedtime routine. Try to avoid emotionally upsetting conversations and activities before trying to go to sleep. Don't dwell on, or bring your problems to bed.  Associate your bed with sleep. It's not a good idea to use your bed to watch TV, listen to the radio, or read.  Make sure that the sleep environment is pleasant and relaxing. The bed should be comfortable, the room should not be too hot or cold, or too bright.

## 2018-07-12 NOTE — Assessment & Plan Note (Signed)
Discussed sleep hygeine 

## 2018-07-12 NOTE — Assessment & Plan Note (Signed)
Continue Suboxone 24mg  daily in divided doses.  Follow up in 4 weeks.

## 2018-07-26 DEATH — deceased

## 2018-11-20 IMAGING — CT CT CERVICAL SPINE W/O CM
3 of 4 series · 9 of 33 positions shown, 11 images · non-contrast
Comparison: Intraoperative images [id number].

CLINICAL DATA: 51-year-old female with history of IVDA, sepsis, and
spinal infection in the cervical and thoracic spine in [REDACTED]
decompression of spinal cord and debridement of epidural abscess.

Secondary kyphosis.
EXAM:
CT CERVICAL SPINE WITHOUT CONTRAST
TECHNIQUE: Multidetector CT imaging of the cervical spine was performed without
intravenous contrast. Multiplanar CT image reconstructions were also
generated.

[Series 5: sagittal bone · sagittal · 0.23mm/px · 5 of 53 slices shown, 6 images]
[im 18/53  bone]
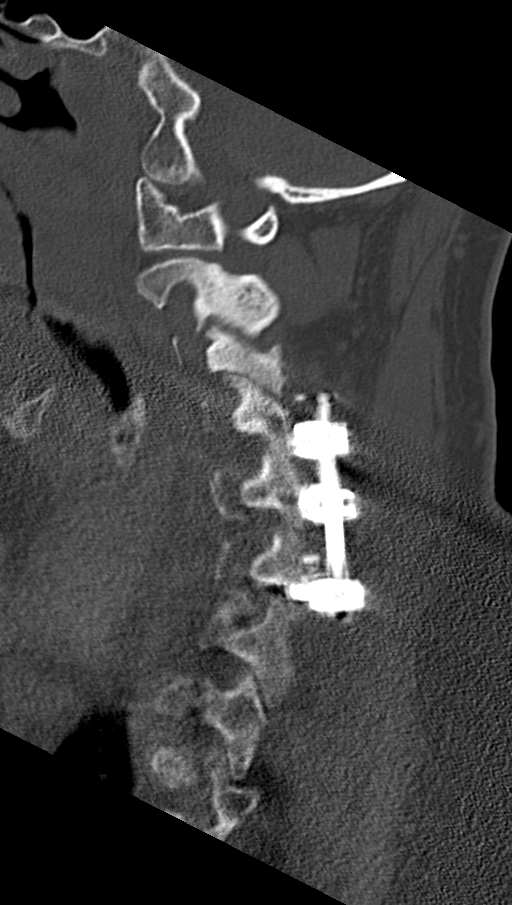
[im 22/53  bone]
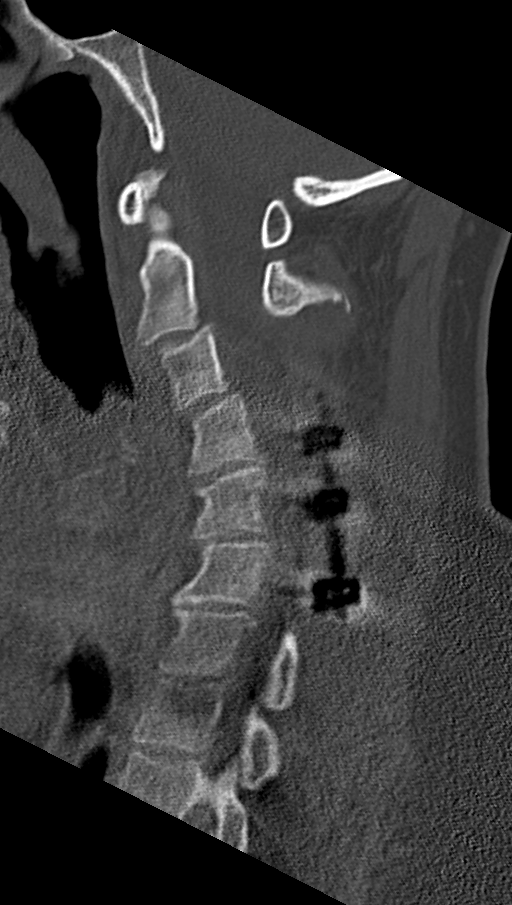
[im 27/53  soft-tissue]
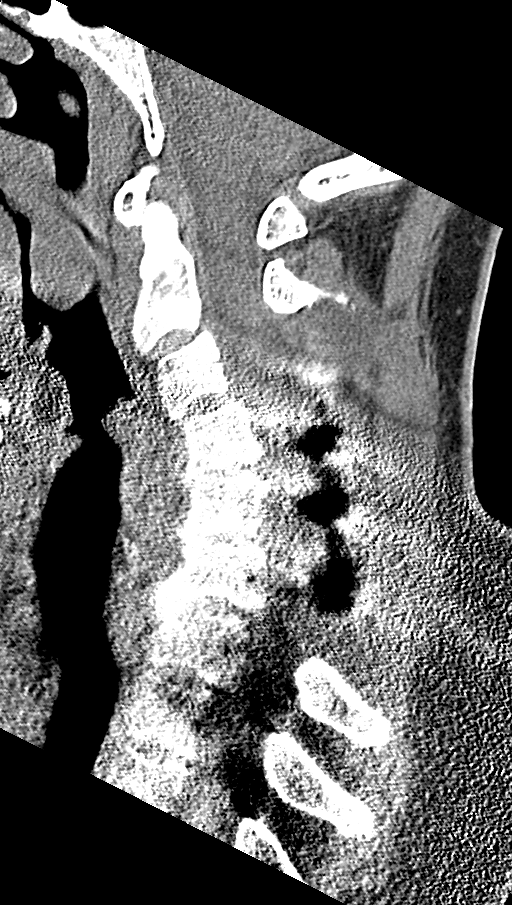
[im 27/53  bone]
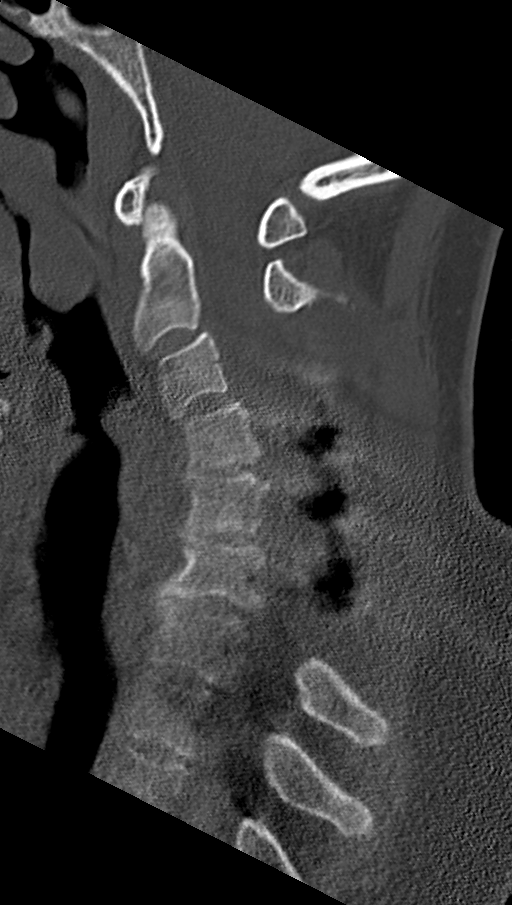
[im 31/53  bone]
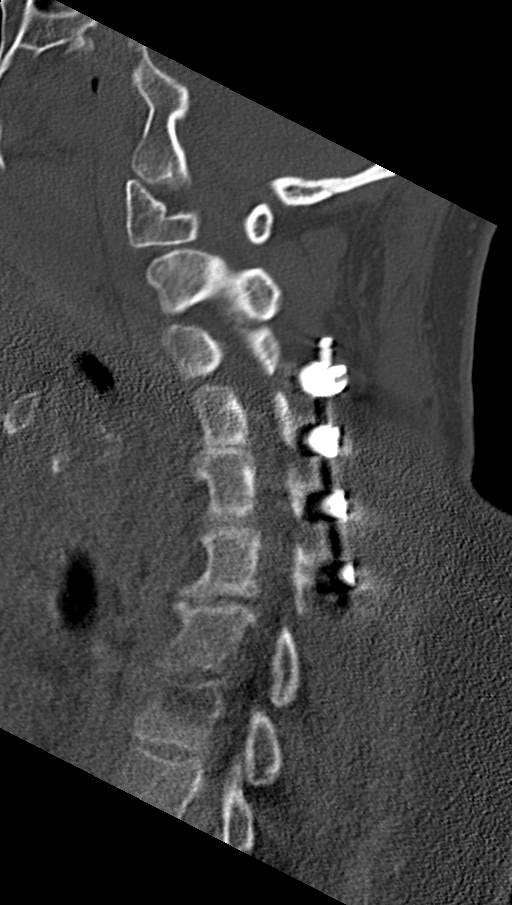
[im 35/53  bone]
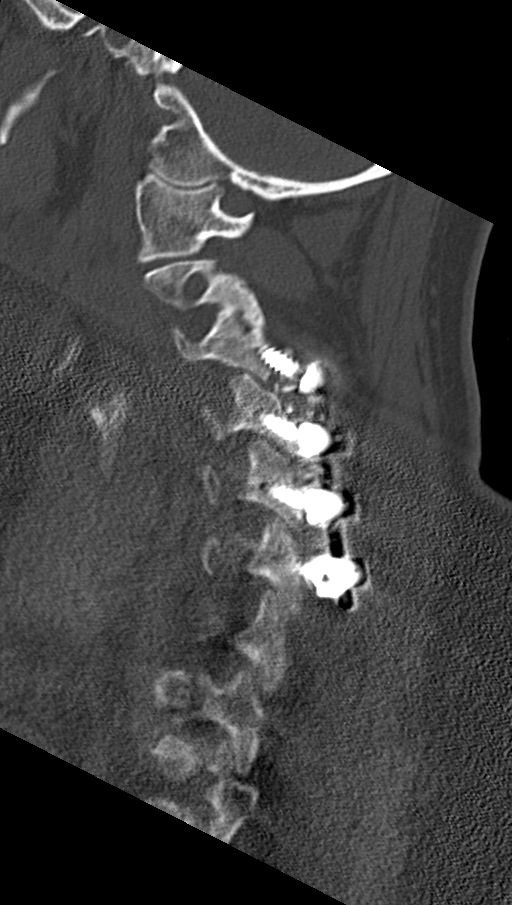

[Series 7: orthogonal bone · axial · 0.23mm/px · z∈[-139,-139]mm · 1 of 88 slices shown, 2 images]
[im 44/88  soft-tissue]
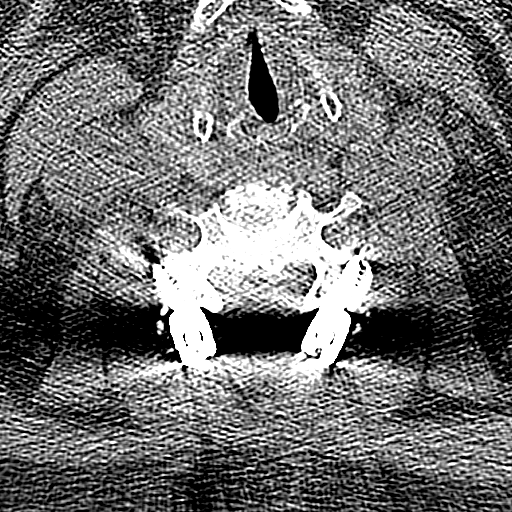
[im 44/88  bone]
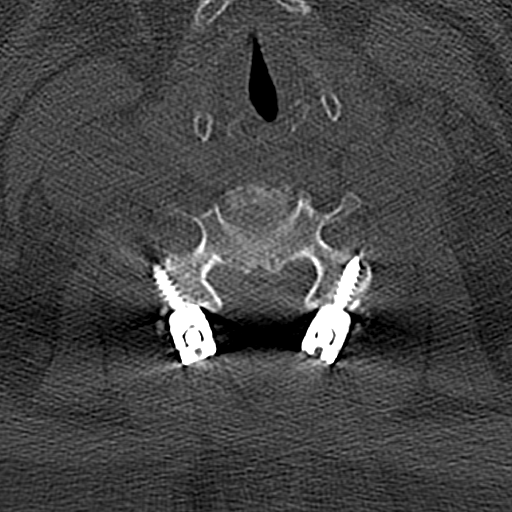

[Series 9: coronal bone · coronal · 0.23mm/px · 3 of 61 slices shown]
[im 13/61  bone]
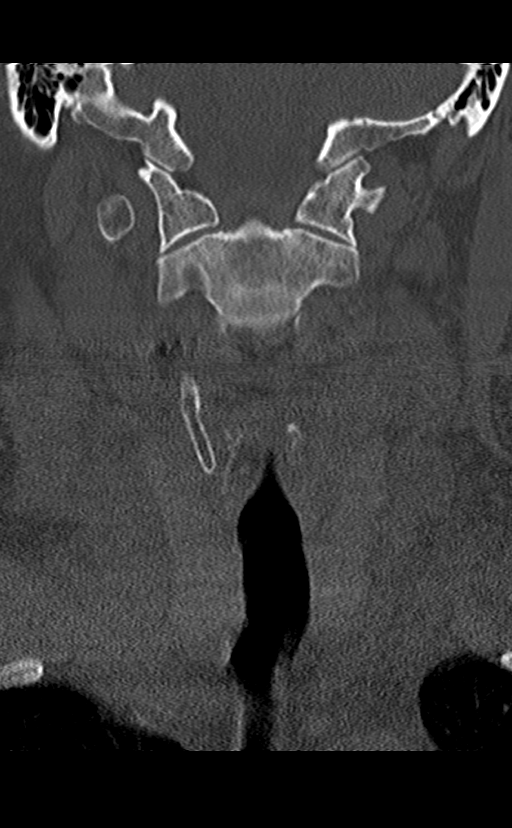
[im 25/61  bone]
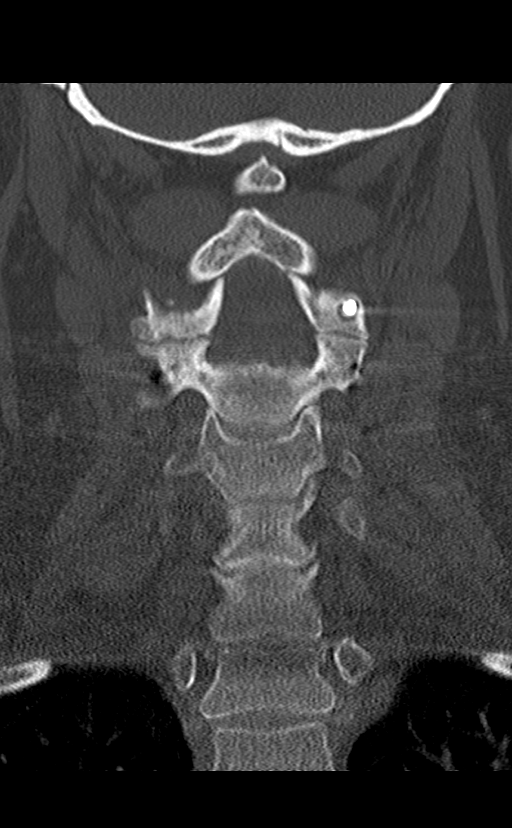
[im 37/61  bone]
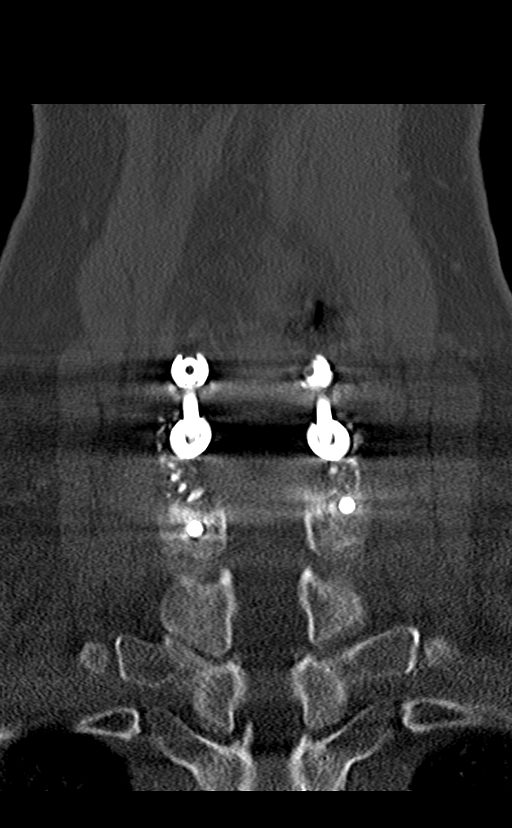

[9 of 33 positions shown; findings below may reference images not displayed]

Preoperative cervical
spine MRI 09/14/2017. Face CT 07/15/2012. Cervical spine radiographs
08/02/2009.
FINDINGS: Alignment: In 3282 the patient demonstrated normal cervical
lordosis. Straightening and mild reversal of lordosis was
demonstrated in 6069, and on the preoperative MRI respectively.

There is now reversal of cervical lordosis with 1 millimeter of
anterolisthesis of C2 on C3 and 2-3 millimeters of anterolisthesis
of C3 on C4. See sagittal image 27.

Skull base and vertebrae: Intact skull base. Normal skull-38
alignment. Intact C1 level.

Postoperative changes to the posterior cervical vertebrae C3 through
C6. No superimposed acute osseous abnormality identified., although
see additional spinal hardware in vertebral detail in the disc level
section below.

Soft tissues and spinal canal: Negative visible noncontrast
posterior fossa. Postoperative changes to the posterior paraspinal
soft tissues with no definite fluid collection or adverse features.
Normal prevertebral soft tissue contours. No cervical
lymphadenopathy.

Disc levels:

C2-C3: Trace anterolisthesis with mild left and severe right facet
hypertrophy and subchondral lucency. See sagittal image 18 on the
right. However, there is evidence of left side facet ankylosis
(sagittal image 34 and coronal image 36). There is moderate to
severe right C3 neural foraminal stenosis. See series 6, image 36.

C3-C4: Posterior decompression. Unilateral left side C3 laminar
hardware with evidence of cortical screw loosening and retraction.
See series 7, image 31 and sagittal image 37. Contralateral right
side moderate facet hypertrophy. There is mild to moderate right C4
foraminal stenosis.

C4-C5: Bilateral C4 and C5 laminar hardware with intact screws and
no evidence of loosening. Disc space loss with endplate degeneration
and spurring. Borderline to mild C5 foraminal stenosis greater on
the left.

C5-C6: Bilateral C5 and C6 laminar hardware with intact screws. No
evidence of hardware loosening, although the right C6 screw tracks
into the left C6-C7 facet joint (series 6, image 68 and sagittal
image 17. Disc space loss with circumferential endplate spurring.

C6-C7: Right C6 laminar screw tracks into the right facet joint as
stated above. Disc space loss with circumferential endplate
spurring. There is moderate bilateral C7 neural foraminal stenosis.

C7-T1:  Disc space loss with mild endplate spurring.  No stenosis.

Upper chest: The visible upper thoracic levels appear intact and
normally aligned. Negative visible noncontrast thoracic inlet.
Normal lung apices.
IMPRESSION: 1. Anterolisthesis of C3 on C4 measuring 2-3 millimeters with
associated facet arthropathy, and loosening/retraction of the
unilateral left C3 laminar screw. See series 7, image 31.
2. Mild anterolisthesis of C2 on C3 with severe right side facet
arthropathy, but evidence of left side facet ankylosis. Bilateral
appears intact C4 through C6 laminar without loosening. Hardware The
right C6 screw tracks into the C6-C7 facet joint.
3. C4-C5 through C6-C7 cervical disc space loss with endplate
degeneration.
